# Patient Record
Sex: Female | Born: 1940
Health system: Southern US, Community
[De-identification: ages and names within clinical notes are randomized; demographics above are authoritative.]

## PROBLEM LIST (undated history)

## (undated) DIAGNOSIS — D471 Chronic myeloproliferative disease: Secondary | ICD-10-CM

## (undated) DIAGNOSIS — E669 Obesity, unspecified: Secondary | ICD-10-CM

## (undated) DIAGNOSIS — I272 Pulmonary hypertension, unspecified: Secondary | ICD-10-CM

## (undated) DIAGNOSIS — K219 Gastro-esophageal reflux disease without esophagitis: Secondary | ICD-10-CM

## (undated) DIAGNOSIS — D649 Anemia, unspecified: Secondary | ICD-10-CM

## (undated) DIAGNOSIS — Z9889 Other specified postprocedural states: Secondary | ICD-10-CM

## (undated) DIAGNOSIS — D45 Polycythemia vera: Secondary | ICD-10-CM

## (undated) DIAGNOSIS — H3530 Unspecified macular degeneration: Secondary | ICD-10-CM

## (undated) DIAGNOSIS — K449 Diaphragmatic hernia without obstruction or gangrene: Secondary | ICD-10-CM

## (undated) DIAGNOSIS — R112 Nausea with vomiting, unspecified: Secondary | ICD-10-CM

## (undated) DIAGNOSIS — I5032 Chronic diastolic (congestive) heart failure: Secondary | ICD-10-CM

## (undated) DIAGNOSIS — K559 Vascular disorder of intestine, unspecified: Secondary | ICD-10-CM

## (undated) DIAGNOSIS — J189 Pneumonia, unspecified organism: Secondary | ICD-10-CM

## (undated) DIAGNOSIS — J449 Chronic obstructive pulmonary disease, unspecified: Secondary | ICD-10-CM

## (undated) DIAGNOSIS — Z8542 Personal history of malignant neoplasm of other parts of uterus: Secondary | ICD-10-CM

## (undated) DIAGNOSIS — G459 Transient cerebral ischemic attack, unspecified: Secondary | ICD-10-CM

## (undated) DIAGNOSIS — R87629 Unspecified abnormal cytological findings in specimens from vagina: Secondary | ICD-10-CM

## (undated) DIAGNOSIS — I10 Essential (primary) hypertension: Secondary | ICD-10-CM

## (undated) DIAGNOSIS — R011 Cardiac murmur, unspecified: Secondary | ICD-10-CM

## (undated) DIAGNOSIS — M1990 Unspecified osteoarthritis, unspecified site: Secondary | ICD-10-CM

## (undated) DIAGNOSIS — I499 Cardiac arrhythmia, unspecified: Secondary | ICD-10-CM

## (undated) DIAGNOSIS — R0602 Shortness of breath: Secondary | ICD-10-CM

## (undated) DIAGNOSIS — C801 Malignant (primary) neoplasm, unspecified: Secondary | ICD-10-CM

## (undated) DIAGNOSIS — M109 Gout, unspecified: Secondary | ICD-10-CM

## (undated) HISTORY — DX: Pneumonia, unspecified organism: J18.9

## (undated) HISTORY — DX: Malignant (primary) neoplasm, unspecified: C80.1

## (undated) HISTORY — DX: Polycythemia vera: D45

## (undated) HISTORY — DX: Unspecified abnormal cytological findings in specimens from vagina: R87.629

## (undated) HISTORY — DX: Essential (primary) hypertension: I10

## (undated) HISTORY — PX: JOINT REPLACEMENT: SHX530

## (undated) HISTORY — DX: Chronic diastolic (congestive) heart failure: I50.32

## (undated) HISTORY — DX: Personal history of malignant neoplasm of other parts of uterus: Z85.42

## (undated) HISTORY — DX: Chronic myeloproliferative disease: D47.1

## (undated) HISTORY — DX: Transient cerebral ischemic attack, unspecified: G45.9

## (undated) HISTORY — PX: EYE SURGERY: SHX253

## (undated) HISTORY — DX: Cardiac murmur, unspecified: R01.1

---

## 2002-03-17 ENCOUNTER — Ambulatory Visit (HOSPITAL_COMMUNITY): Admission: RE | Admit: 2002-03-17 | Discharge: 2002-03-17 | Payer: Self-pay | Admitting: Family Medicine

## 2002-03-17 ENCOUNTER — Encounter: Payer: Self-pay | Admitting: Family Medicine

## 2003-01-22 ENCOUNTER — Ambulatory Visit (HOSPITAL_COMMUNITY): Admission: RE | Admit: 2003-01-22 | Discharge: 2003-01-22 | Payer: Self-pay | Admitting: Internal Medicine

## 2003-07-17 ENCOUNTER — Other Ambulatory Visit: Admission: RE | Admit: 2003-07-17 | Discharge: 2003-07-17 | Payer: Self-pay | Admitting: Obstetrics and Gynecology

## 2004-01-27 ENCOUNTER — Ambulatory Visit (HOSPITAL_COMMUNITY): Admission: RE | Admit: 2004-01-27 | Discharge: 2004-01-27 | Payer: Self-pay | Admitting: Internal Medicine

## 2004-05-20 ENCOUNTER — Ambulatory Visit (HOSPITAL_COMMUNITY): Admission: RE | Admit: 2004-05-20 | Discharge: 2004-05-20 | Payer: Self-pay | Admitting: Internal Medicine

## 2005-02-13 ENCOUNTER — Encounter (HOSPITAL_COMMUNITY): Admission: RE | Admit: 2005-02-13 | Discharge: 2005-02-14 | Payer: Self-pay | Admitting: Internal Medicine

## 2005-09-14 ENCOUNTER — Encounter: Admission: RE | Admit: 2005-09-14 | Discharge: 2005-09-14 | Payer: Self-pay | Admitting: Family Medicine

## 2005-09-14 ENCOUNTER — Emergency Department (HOSPITAL_COMMUNITY): Admission: EM | Admit: 2005-09-14 | Discharge: 2005-09-14 | Payer: Self-pay | Admitting: Emergency Medicine

## 2007-12-10 ENCOUNTER — Ambulatory Visit (HOSPITAL_COMMUNITY): Admission: RE | Admit: 2007-12-10 | Discharge: 2007-12-10 | Payer: Self-pay | Admitting: Internal Medicine

## 2008-01-28 ENCOUNTER — Ambulatory Visit (HOSPITAL_COMMUNITY): Admission: RE | Admit: 2008-01-28 | Discharge: 2008-01-28 | Payer: Self-pay | Admitting: Internal Medicine

## 2009-10-15 ENCOUNTER — Emergency Department (HOSPITAL_COMMUNITY): Admission: EM | Admit: 2009-10-15 | Discharge: 2009-10-15 | Payer: Self-pay | Admitting: Emergency Medicine

## 2010-01-18 ENCOUNTER — Emergency Department (HOSPITAL_COMMUNITY): Admission: EM | Admit: 2010-01-18 | Discharge: 2010-01-18 | Payer: Self-pay | Admitting: Emergency Medicine

## 2010-02-07 ENCOUNTER — Other Ambulatory Visit: Admission: RE | Admit: 2010-02-07 | Discharge: 2010-02-07 | Payer: Self-pay | Admitting: Obstetrics and Gynecology

## 2010-02-08 ENCOUNTER — Ambulatory Visit (HOSPITAL_COMMUNITY): Admission: RE | Admit: 2010-02-08 | Discharge: 2010-02-08 | Payer: Self-pay | Admitting: Obstetrics & Gynecology

## 2010-02-14 ENCOUNTER — Ambulatory Visit (HOSPITAL_COMMUNITY): Admission: RE | Admit: 2010-02-14 | Discharge: 2010-02-14 | Payer: Self-pay | Admitting: Obstetrics & Gynecology

## 2010-02-23 ENCOUNTER — Ambulatory Visit (HOSPITAL_COMMUNITY): Admission: RE | Admit: 2010-02-23 | Discharge: 2010-02-23 | Payer: Self-pay | Admitting: Obstetrics & Gynecology

## 2010-05-24 ENCOUNTER — Ambulatory Visit: Payer: Self-pay | Admitting: Orthopedic Surgery

## 2010-05-24 DIAGNOSIS — IMO0002 Reserved for concepts with insufficient information to code with codable children: Secondary | ICD-10-CM | POA: Insufficient documentation

## 2010-05-24 DIAGNOSIS — M1710 Unilateral primary osteoarthritis, unspecified knee: Secondary | ICD-10-CM

## 2010-05-25 ENCOUNTER — Encounter: Payer: Self-pay | Admitting: Orthopedic Surgery

## 2010-09-04 ENCOUNTER — Emergency Department (HOSPITAL_COMMUNITY): Admission: EM | Admit: 2010-09-04 | Discharge: 2010-09-04 | Payer: Self-pay | Admitting: Emergency Medicine

## 2010-11-27 ENCOUNTER — Encounter: Payer: Self-pay | Admitting: Internal Medicine

## 2010-12-08 NOTE — Letter (Signed)
Summary: History form  History form   Imported By: Jacklynn Ganong 05/26/2010 09:25:24  _____________________________________________________________________  External Attachment:    Type:   Image     Comment:   External Document

## 2010-12-08 NOTE — Assessment & Plan Note (Signed)
Summary: BI KNEE PAIN NEEDS XR/SEC HOR/BSF   Vital Signs:  Patient profile:   70 year old female Height:      65 inches Weight:      279 pounds Pulse rate:   80 / minute Resp:     18 per minute  Vitals Entered By: Fuller Canada MD (May 24, 2010 3:05 PM)  Visit Type:  new patient Referring Provider:  self Primary Provider:  Dr. Ouida Sills  CC:  bilateral knee pain.  History of Present Illness: I saw [name] in the office today for an initial visit.  She is a 70 years old woman with the complaint of:  bilateral knee pain.  Xrays today in our office.  Meds: Atenolol, HCTZ, Nexium.    Allergies (verified): 1)  ! Tegretol  Past History:  Past Medical History: htn reflux  Past Surgical History: spur removed rt shoulder  Family History: Family History of Arthritis  Social History: Patient is married.  retired no smokintg no alcohol 6 cups of caffeine per day  Review of Systems Constitutional:  Complains of fatigue; denies weight loss, weight gain, fever, and chills. Cardiovascular:  Denies chest pain, palpitations, fainting, and murmurs. Respiratory:  Complains of short of breath, wheezing, and couch; denies tightness, pain on inspiration, and snoring . Gastrointestinal:  Complains of heartburn and vomiting; denies nausea, diarrhea, constipation, and blood in your stools. Genitourinary:  Denies frequency, urgency, difficulty urinating, painful urination, flank pain, and bleeding in urine. Neurologic:  Complains of dizziness; denies numbness, tingling, unsteady gait, tremors, and seizure. Musculoskeletal:  Complains of joint pain and swelling; denies instability, stiffness, redness, heat, and muscle pain. Endocrine:  Denies excessive thirst, exessive urination, and heat or cold intolerance. Psychiatric:  Denies nervousness, depression, anxiety, and hallucinations. Skin:  Denies changes in the skin, poor healing, rash, itching, and redness. HEENT:  Complains  of watering; denies blurred or double vision, eye pain, and redness. Immunology:  Denies seasonal allergies, sinus problems, and allergic to bee stings. Hemoatologic:  Denies easy bleeding and brusing.  Physical Exam  Msk:  The patient is well developed and nourished, with normal grooming and hygiene. The body habitus is  notable for obesity Pulses:  The pulses and perfusion were normal with normal color, temperature  and no swelling  The venous stasis on the LEFT is more severe than the RIGHT with multiple engorged veins  Extremities:  bilateral knee ambulation is noted for no excessive length or asymmetry.  Knees exhibit flexion arc 115 with crepitance in the patellofemoral joint, medial joint line tenderness, normal motor exam, no ligamentous instability and no joint effusion Neurologic:  The coordination and sensation were normal  The reflexes were normal   Skin:  intact without lesions or rashes Inguinal Nodes:  no significant adenopathy Psych:  alert and cooperative; normal mood and affect; normal attention span and concentration   Impression & Recommendations:  Problem # 1:  KNEE, ARTHRITIS, DEGEN./OSTEO (ICD-715.96)  bilateral knee x-rays show severe patellofemoral joint disease with joint space narrowing and almost no joint space visible.  The tibiofemoral articulation is fairly preserved.  Impression excessive patellofemoral joint arthritis with overall gonarthrosis.  Recommend injection or anti-inflammatory. I think the anti-inflammatory should be okay. She is already on Nexium, and she does not want an injection and is not ready for surgery. Her updated medication list for this problem includes:    Cataflam 50 Mg Tabs (Diclofenac potassium) .Marland Kitchen... 1 two times a day  Orders: New Patient Level III ([address])  Knee x-ray,  3 views ([address]40)  Medications Added to Medication List This Visit: 1)  Cataflam 50 Mg Tabs (Diclofenac potassium) .Marland Kitchen.. 1 two times a day  Patient  Instructions: 1)  Arthritis of the knees  2)  Options  3)  injection vs oral antiinflammatory  4)  Diclofenac 50 mg two times a day  5)  Please schedule a follow-up appointment as needed. Prescriptions: CATAFLAM 50 MG TABS (DICLOFENAC POTASSIUM) 1 two times a day  #60 x 5   Entered and Authorized by:   Fuller Canada MD   Signed by:   Fuller Canada MD on 05/24/2010   Method used:   Print then Give to Patient   RxID:   [id number]   Appended Document: BI KNEE PAIN NEEDS XR/SEC HOR/BSF h/o knee pain after trip to [facility] in which she had to do a lot of stair climbing, while visiting her daughter. She now complains of a gradual onset of sharp throbbing, diffuse knee pain, which she rates a 5 or 8/10. It occurs intermittently and is associated with swelling.  Previous treatment, Tylenol, arthritis, which did not help. Surgery years ago. She had Celebrex but developed reflux, and had gone, Nexium. She is currently taking Advil 400 mg with some relief.  She has a family history of rheumatoid arthritis.

## 2011-01-18 LAB — CBC
HCT: 46.4 % — ABNORMAL HIGH (ref 36.0–46.0)
Hemoglobin: 15.5 g/dL — ABNORMAL HIGH (ref 12.0–15.0)
MCH: 27.1 pg (ref 26.0–34.0)
MCHC: 33.3 g/dL (ref 30.0–36.0)
Platelets: 616 10*3/uL — ABNORMAL HIGH (ref 150–400)

## 2011-01-18 LAB — DIFFERENTIAL
Basophils Relative: 1 % (ref 0–1)
Eosinophils Absolute: 0.4 10*3/uL (ref 0.0–0.7)
Eosinophils Relative: 3 % (ref 0–5)
Lymphs Abs: 1.7 10*3/uL (ref 0.7–4.0)

## 2011-01-18 LAB — BASIC METABOLIC PANEL
CO2: 30 mEq/L (ref 19–32)
Chloride: 96 mEq/L (ref 96–112)
GFR calc Af Amer: >60 mL/min (ref >60)
Potassium: 3.6 mEq/L (ref 3.5–5.1)
Sodium: 136 mEq/L (ref 135–145)

## 2011-02-07 LAB — CBC
Hemoglobin: 15.9 g/dL — ABNORMAL HIGH (ref 12.0–15.0)
MCHC: 33.8 g/dL (ref 30.0–36.0)
MCV: 80.3 fL (ref 78.0–100.0)
Platelets: 528 10*3/uL — ABNORMAL HIGH (ref 150–400)
RBC: 5.85 MIL/uL — ABNORMAL HIGH (ref 3.87–5.11)
WBC: 7.6 10*3/uL (ref 4.0–10.5)

## 2011-02-07 LAB — URINALYSIS, ROUTINE W REFLEX MICROSCOPIC
Glucose, UA: NEGATIVE mg/dL
Hgb urine dipstick: NEGATIVE
Nitrite: NEGATIVE
Specific Gravity, Urine: 1.02 (ref 1.005–1.030)
Urobilinogen, UA: 1 mg/dL (ref 0.0–1.0)

## 2011-02-07 LAB — DIFFERENTIAL
Basophils Relative: 0 % (ref 0–1)
Lymphs Abs: 1 10*3/uL (ref 0.7–4.0)
Monocytes Absolute: 0.5 10*3/uL (ref 0.1–1.0)
Monocytes Relative: 6 % (ref 3–12)
Neutro Abs: 6.1 10*3/uL (ref 1.7–7.7)
Neutrophils Relative %: 81 % — ABNORMAL HIGH (ref 43–77)

## 2011-02-07 LAB — COMPREHENSIVE METABOLIC PANEL
AST: 30 U/L (ref 0–37)
CO2: 31 mEq/L (ref 19–32)
Calcium: 8.8 mg/dL (ref 8.4–10.5)
Creatinine, Ser: 0.49 mg/dL (ref 0.4–1.2)
GFR calc non Af Amer: >60 mL/min (ref >60)
Glucose, Bld: 117 mg/dL — ABNORMAL HIGH (ref 70–99)
Potassium: 3.6 mEq/L (ref 3.5–5.1)
Total Bilirubin: 0.6 mg/dL (ref 0.3–1.2)

## 2011-02-07 LAB — URINE MICROSCOPIC-ADD ON

## 2011-03-09 ENCOUNTER — Encounter (HOSPITAL_COMMUNITY): Payer: Self-pay

## 2011-03-13 ENCOUNTER — Ambulatory Visit (HOSPITAL_COMMUNITY)
Admission: RE | Admit: 2011-03-13 | Discharge: 2011-03-13 | Disposition: A | Discharge status: Home / Self Care | Payer: Medicare Other | Source: Ambulatory Visit | Attending: Pulmonary Disease | Admitting: Pulmonary Disease

## 2011-03-13 DIAGNOSIS — R0989 Other specified symptoms and signs involving the circulatory and respiratory systems: Secondary | ICD-10-CM | POA: Insufficient documentation

## 2011-03-13 DIAGNOSIS — R0609 Other forms of dyspnea: Secondary | ICD-10-CM | POA: Insufficient documentation

## 2011-03-13 LAB — BLOOD GAS, ARTERIAL
Acid-Base Excess: 1.7 mmol/L (ref 0.0–2.0)
O2 Saturation: 95.1 %
Patient temperature: 37
TCO2: 22.1 mmol/L (ref 0–100)
pCO2 arterial: 42 mmHg (ref 35.0–45.0)
pH, Arterial: 7.407 — ABNORMAL HIGH (ref 7.350–7.400)
pO2, Arterial: 74.3 mmHg — ABNORMAL LOW (ref 80.0–100.0)

## 2011-03-24 NOTE — Procedures (Signed)
NAME:  [name], [name]                        ACCOUNT NO.:  [id number]   MEDICAL RECORD NO.:  [id number]                   PATIENT TYPE:  OUT   LOCATION:  RAD                                  FACILITY:  [facility]   PHYSICIAN:  Edward L. [name], M.D.             DATE OF BIRTH:  June 29, 1941   DATE OF PROCEDURE:  DATE OF DISCHARGE:                              PULMONARY FUNCTION TEST   FINDINGS:  1. Spirometry reveals a mild, ventilatory defect without definite air-flow     obstruction.  2. Lung volumes show reduction in total lung capacity of approximately the     same order of magnitude as the ventilatory defect.  3. DLCO is mildly reduced.      ___________________________________________                                            Oneal Deputy. [name], M.D.   ELH/MEDQ  D:  01/28/2004  T:  01/28/2004  Job:  [id number]   cc:   Kingsley Callander. Ouida Sills, M.D.  [address]  [place]  [place] [address]  Fax: [phone]

## 2011-03-24 NOTE — Consult Note (Signed)
[name], [name]                          ACCOUNT NO.:  [id number]   MEDICAL RECORD NO.:  [id number]                  PATIENT TYPE:   LOCATION:                                       FACILITY:   PHYSICIAN:  Lionel December, M.D.                 DATE OF BIRTH:  1941-06-20   DATE OF CONSULTATION:  05/18/2004  DATE OF DISCHARGE:                                   CONSULTATION   REASON FOR CONSULTATION:  Flare-up with symptoms of GERD, not responding to  therapy.   HISTORY OF PRESENT ILLNESS:  [name] is a 70 year old Caucasian female who  is referred through the courtesy of Dr. Ouida Sills for GI evaluation.  She has a  several year history of GERD.  She had a moderate sized hiatal hernia.  She  had laparoscopic Nissen by Dr. Lovell Sheehan in September 1996.  She recalled that  she did well for a couple of years.  Then she started to have heartburn,  regurgitation and went back on Prilosec six years ago.  She started to have  a recurrent cough and also intermittent hoarseness.  Two months ago she was  switched to Nexium.  Since she has been on Nexium, she has noted reduction  in her cough.  She had a chest x-ray by Dr. Ouida Sills.  This suggested a  moderate sized sliding hiatal hernia.  The chest x-ray was performed because  of chest pain and shortness of breath.  The patient is concerned about  recurrence of her hernia and is interested in having this fixed again if it  would improve her symptoms.  She also complains of dyspnea, which occurs  when she is lying flat.  She has a cough intermittently, which is dry.  She  is using [employer], which helps.  Although she has lost six pounds  recently, her weight has been gradually going up over the last few years.  When she saw Dr. Jena Gauss back in February 1996, her weight was 246 pounds; now  she is 279 pounds.  She denies dysphagia.  She has a very good appetite.  She denies abdominal pain, melena or rectal bleeding.  Her bowels generally  move  regularly.  She was seen by Dr. Ouida Sills recently.  He noted that she was  borderline diabetic and suggested that she go on a diet and try to lose two  pounds a month.  She does not do much in the way of exercise or walking.   She is on:  1. Nexium 40 mg b.i.d.  2. Allegra 180 mg q.d.  3. HCTZ 25 mg q.d.  4. Benzonatate Perles 200 mg t.i.d. p.r.n.   PAST MEDICAL HISTORY:  1. She has hypertension.  2. Chronic cough, felt to be secondary to reflux.  3. She has had benign lesion removed from her breast.  4. She has had ear surgery years ago.  5. She also has  a history of seizure disorder.  She was seen by Dr. Buzzy Han     back in 1995 and his assessment was that she had uncinate seizure or     partial seizure complex.  Lately she has not been on any medication.  6. She also had colonoscopy by Dr. Jena Gauss in September 1995, which was     essentially normal.  Her EGD in July 1995 revealed Schatzki's ring, 6 cm     sized hiatal hernia and small papule in her esophagus, which, on biopsy,     revealed esophagitis.   ALLERGIES:  TEGRETOL.   FAMILY HISTORY:  Mother is 6 years old and in fair health.  Father died of  lung carcinoma at age 64.  She has two sisters in good health.   SOCIAL HISTORY:  She is married and has five children in good health.  She  works with [employer] out of [place].  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  She has never smoked cigarettes and does  not drink alcohol.   PHYSICAL EXAMINATION:  GENERAL:  Pleasant, very obese, Caucasian female who  is in no acute distress.  VITAL SIGNS:  She weighs 279 pounds; she is 68 inches tall; pulse 80;  __________ blood pressure 120/82; she is afebrile.  HEENT:  Conjunctivae is pink; sclerae is nonicteric; oropharyngeal mucosa is  normal.  NECK:  Without masses or thyromegaly.  CARDIAC:  Regular rhythm; normal S1, S3; no murmur or gallop noted.  LUNGS:  Clear to auscultation.  ABDOMEN:  Obese, soft  and nontender without organomegaly or masses.  RECTAL:  Deferred.  EXTREMITIES:  She has trace edema at her ankles.   She had a chest x-ray on January 27, 2004 which revealed moderate sized hiatal  hernia with no active lung disease.   She had pulmonary function studies, which revealed some normal lung function  values.  However, she did not have these studies in the supine position.   ASSESSMENT:  [name] is a 70 year old Caucasian female who has chronic  cough, which appears to be secondary to poorly controlled gastroesophageal  reflux disease.  She is status post laparoscopic Nissen about nine years  ago.  I suspect that her wrap has been undone, as is obvious by recurrence  of at least a moderate sized hiatal hernia.  I doubt that this is a slipped  Nissen.  As far as her positional dyspnea or supine dyspnea is concerned, I  am not convinced that this is due to reflux.  I suspect that this is due to  restrictive disease secondary to her obesity.  It would have been  interesting to see what her lung volumes were in supine position.  I  completely agree with Dr. Ouida Sills that weight reduction is crucial in her  treatment plan.   She is at the age of risk for colorectal carcinoma and last colonoscopy was  about 10 years ago and she can consider having one at a later date or next  year when her other issues have been addressed.   RECOMMENDATIONS:  She will continue anti-reflux measures as before.  Weight  reduction is stressed with the patient.  She needs to start walking or  exercising and gradually increase the duration of activity.   Diagnostic esophagogastroduodenoscopy to be performed at [facility]  in the near future.   We would like to thank Dr. Ouida Sills for the opportunity to participate in the  care of this nice lady.      ___________________________________________                                            Lionel December, M.D.  NR/MEDQ  D:  05/18/2004  T:   05/18/2004  Job:  [id number]   cc:   Kingsley Callander. Ouida Sills, M.D.  [address]  [place]  [place] [address]  Fax: [phone]

## 2011-03-24 NOTE — Op Note (Signed)
NAME:  [name], [name]                        ACCOUNT NO.:  [id number]   MEDICAL RECORD NO.:  [id number]                   PATIENT TYPE:  AMB   LOCATION:  DAY                                  FACILITY:  [facility]   PHYSICIAN:  Lionel December, M.D.                 DATE OF BIRTH:  09/15/1941   DATE OF PROCEDURE:  DATE OF DISCHARGE:                                 OPERATIVE REPORT   PROCEDURE:  Esophagogastroduodenoscopy.   ENDOSCOPIST:  Lionel December, M.D.   INDICATIONS:  This patient is a 70 year old Caucasian female with  longstanding GERD who had laparoscopic Nissen in 1996, but had to go back  onto a PPI about 6 years ago and now she is on double-dose Nexium and still  coughing, particularly in the supine position and she also has dyspnea when  she is supine. It is suspected that she is still refluxing, although dyspnea  in the supine position may be due to positional restrictive disease.  She is  undergoing diagnostic EGD.  The procedure and risks were reviewed with the  patient and informed consent was obtained.   PREOPERATIVE MEDICATIONS:  Cetacaine spray for oropharyngeal topical  anesthesia, Demerol 50 mg IV and Versed 6 mg IV.   FINDINGS:  Procedure performed in endoscopy suite.  The patient's vital  signs and O2 saturation were monitored during the procedure and remained  stable.  The patient was placed in the left lateral recumbent position and  Olympus videoscope was passed via the oropharynx without any difficulty into  the esophagus.   ESOPHAGUS:  Mucosa of the esophagus was normal except that she had 2 patches  of gastric-type or salmon-colored mucosa above the GE junction.  The GE  junction was at 35 cm.  The length of this mucosa was 1 cm.  The larger of  the 2 patches which was located around 2-3 o'clock had a tiny Michaelfurt of  squamous epithelium in it.  Pictures taken, followed by biopsies on the way  out to confirm endoscopic impression.   Diaphragmatic hiatus was  at 40 cm.  The hernia was felt to be at least a  moderate size; and she had some food debris in this hernia.   STOMACH:  It was empty and distended very well with insufflation.  The folds  of the proximal stomach were normal.  Examination of the mucosa at body,  antrum, pyloric channel, as well as fundus and cardia was normal.  There was  no wrap; it was completely undone.   DUODENUM:  Examination of the bulb revealed normal mucosa.  The scope was  passed to the second part of the duodenum where the mucosa and folds were  normal.   Endoscope was withdrawn.  On the way out biopsy was taken from this gastric-  type mucosa distal esophagus. The patient tolerated the procedure well.   FINAL DIAGNOSES:  1. Moderate sized sliding hiatal  hernia with some food debris in the     dependent segment of his hernia.  2. Fundal wrap is nonexistent or completely gone.  3. Two patches of gastric-type mucosa at distal esophagus, very suspicious     for Barrett's; segment biopsy taken.   RECOMMENDATIONS:  1. She should continue antireflux measures. Since she is interested in     antireflux surgery, I would recommend that she looses about 30 pounds     before this is considered.  In the meantime will try her on Reglan at 10     mg b.i.d. (before breakfast and the evening meal).  2. I will be contacting the patient with biopsy results.      ___________________________________________                                            Lionel December, M.D.   NR/MEDQ  D:  05/20/2004  T:  05/20/2004  Job:  [id number]   cc:   Kingsley Callander. Ouida Sills, M.D.  [address]  [place]  [place] [address]  Fax: [phone]   Dalia Heading, M.D.  [address] [place]  [place] [address]  Fax: [phone]

## 2011-03-24 NOTE — Procedures (Signed)
NAME:  [name], [name]              ACCOUNT NO.:  [id number]   MEDICAL RECORD NO.:  [id number]         PATIENT TYPE:  REC   LOCATION:  RAD                           FACILITY:  [facility]   PHYSICIAN:  Kingsley Callander. Ouida Sills, MD       DATE OF BIRTH:  [birth year]   DATE OF PROCEDURE:  02/13/2005  DATE OF DISCHARGE:                                    STRESS TEST   PROCEDURE:  Adenosine Myoview stress test.   OPERATOR:  Kingsley Callander. Ouida Sills, M.D.   The patient underwent an Adenosine Myoview stress test accoding to the  protocol. Adenosine was administered over 4 minutes.  Myoview was injected  at 3 minutes.  The patient experienced chest tightness and flushing.  Her  baseline EKG revealed normal sinus rhythm.  There were no ischemic changes  throughout the test. She was normotensive.  Her symptoms resolved promptly  after the adenosine infusion was completed.   Myoview images are pending.      ROF/MEDQ  D:  02/13/2005  T:  02/13/2005  Job:  [id number]

## 2011-03-24 NOTE — Op Note (Signed)
   NAME:  MAKAI, AGOSTINELLI                          ACCOUNT NO.:  [id number]   MEDICAL RECORD NO.:  [id number]                   PATIENT TYPE:  AMB   LOCATION:  DAY                                  FACILITY:  [facility]   PHYSICIAN:  Tilda Burrow, M.D.              DATE OF BIRTH:  10-13-1941   DATE OF PROCEDURE:  DATE OF DISCHARGE:                                 OPERATIVE REPORT   PREOPERATIVE DIAGNOSIS:  Postmenopausal bleeding.   POSTOPERATIVE DIAGNOSES:  1. Postmenopausal bleeding.  2. Endometrial polyp.   PROCEDURE:  1. Hysteroscopy.  2. Dilatation and curettage with excision of endometrial polyp.   SURGEON:  Tilda Burrow, M.D.   ASSISTANT:  None.   ANESTHESIA:  Spinal   COMPLICATIONS:  None.   FINDINGS:  Uterus sounding to 8 cm, stenotic endocervical canal initially  making sounding, and dilation difficulty but responding subsequently.  A 3-  cm, posterior, right cornual or right fundal polyp.  Smooth endometrium  otherwise.  Photo documentation taken.   DETAILS OF PROCEDURE:  The patient was taken to the operating room and  prepped and draped for vaginal procedure after spinal anesthesia in place.  The legs were supported with yellow fin leg supports.  In and out  catheterization of the bladder removed 8 ounces of urine.  We grasped the  cervix with a single-tooth tenaculum, dilated to 31 [place] and sounded to a  depth of 8 cm.   The hysteroscope was introduce and direct hysteroscopic visualization showed  a large polyp originating from the fundal portion on the right side towards  the back.  Hysteroscopic scissors were used to cut, partially through, the  pedicle, but technically limitations prevented complete excision this way.  Its large size allowed it to be grasped with uterine packing forceps and it  was extracted using blunt perception and then smooth, sharp, curettage in  that area removed some additional fragments.  Reinspection, as documented in  photo  #3, showed that the pedicle had been removed excellently with minimal  residual.  We trimmed a couple of remnants of the pedicle with hysteroscopic  scissors and then considered the procedure a success.  Tissue was sent for  confirmation, histologic evaluation.  The patient allowed to go to the  recovery room in good condition.                                               Tilda Burrow, M.D.    JVF/MEDQ  D:  01/22/2003  T:  01/22/2003  Job:  [id number]   cc:   Kingsley Callander. Ouida Sills, M.D.  [address]  [place]  [place] [address]  Fax: [phone]

## 2011-03-24 NOTE — H&P (Signed)
NAME:  [name], [name]                        ACCOUNT NO.:  [id number]   MEDICAL RECORD NO.:  [id number]                  PATIENT TYPE:   LOCATION:                                       FACILITY:   PHYSICIAN:  Tilda Burrow, M.D.              DATE OF BIRTH:  04/16/1941   DATE OF ADMISSION:  01/22/2003  DATE OF DISCHARGE:                                HISTORY & PHYSICAL   ADMISSION DIAGNOSES:  1. Postmenopausal bleeding.  2. Obesity.  3. Uncinate seizures, stable.  4. Gastroesophageal reflux disease.   HISTORY OF PRESENT ILLNESS:  This 70 year old female many years  postmenopausal, with a history of intermittent breakthrough bleeding on  Premphase during the late 1990s for which endometrial biopsy was performed  showing simple hyperplasia without atypia, has been managed on Prempro until  2003, at which time she discontinued all hormone therapy due to concerns  over controversy regarding breast cancer risks.  Over the past year she has  been on no hormone therapy at all.  She recently began to have  postmenopausal bleeding of a heavy nature equal to a normal menses.  The  symptoms began as cramping for approximately two weeks and then the lower  abdominal discomfort turned into bleeding.  The patient cannot tolerate  endometrial biopsy in the office and is therefore scheduled for  hysteroscopy, D&C for tissue diagnosis.  The patient has had the procedure  explained using instructional booklets from [facility] and [facility] regarding  hysteroscopy.   PAST MEDICAL HISTORY:  Positive for uncinate seizures since 1995, adult-  onset.  These consist of a weird odor, impending doom sensation, with a  normal 16-channel EEG performed in 1995.  The patient is not on any other  medicines at this time.  She was tried on Tegretol and found to be  allergic to this.  Medical history also notable for heartburn, for which  she takes Prilosec OTC.   PAST SURGICAL HISTORY:  In 03/07/88, bilateral  breast biopsies, Barbaraann Barthel, M.D.  07/08/88, rectocele and enterocele repair with preservation of  the uterus, Tilda Burrow, M.D.  1990, ear surgery.  Hernia repair, Dalia Heading, M.D., I do not have the date.   ALLERGIES:  TEGRETOL.   MEDICATIONS:  1. Prilosec OTC.  2. Levsin 0.125 mg t.i.d. sublingual for GI spasm.  3. HCTZ 12.5 mg daily by Kingsley Callander. Ouida Sills, M.D. (question hypertension).   PHYSICAL EXAMINATION:  GENERAL:  An obese, large-framed Caucasian female.  VITAL SIGNS:  Height 5 feet 9 inches, weight 276, blood pressure 140/80.  HEENT:  Pupils are equal, round, reactive.  Extraocular movements intact.  NECK:  Supple.  CARDIAC:  Unremarkable.  ABDOMEN:  Marked obesity.  PELVIC:  External genitalia shows good support, status post posterior  repair.  Cervix is well-supported, and the canal is stenotic.  Bleeding is  coming from the endocervical canal but, unfortunately, the patient  could not  tolerate office endometrial biopsy efforts.  Adnexa negative for masses.  EXTREMITIES:  Without gross abnormality.  Good perfusion.  No cyanosis,  clubbing, or edema.   ASSESSMENT:  Postmenopausal bleeding in a 70 year old female, for  hysteroscopy, D&C on 01/21/02, at [facility].                                               Tilda Burrow, M.D.    JVF/MEDQ  D:  01/13/2003  T:  01/13/2003  Job:  [id number]   cc:   Kingsley Callander. Ouida Sills, M.D.  [address]  [place]  [place] [address]  Fax: [phone]

## 2011-05-27 ENCOUNTER — Encounter: Payer: Self-pay | Admitting: *Deleted

## 2011-05-27 ENCOUNTER — Emergency Department (HOSPITAL_COMMUNITY): Payer: Medicare Other

## 2011-05-27 ENCOUNTER — Emergency Department (HOSPITAL_COMMUNITY)
Admission: EM | Admit: 2011-05-27 | Discharge: 2011-05-27 | Disposition: A | Discharge status: Home / Self Care | Payer: Medicare Other | Attending: Emergency Medicine | Admitting: Emergency Medicine

## 2011-05-27 DIAGNOSIS — M25552 Pain in left hip: Secondary | ICD-10-CM

## 2011-05-27 DIAGNOSIS — M25569 Pain in unspecified knee: Secondary | ICD-10-CM | POA: Insufficient documentation

## 2011-05-27 DIAGNOSIS — M79672 Pain in left foot: Secondary | ICD-10-CM

## 2011-05-27 DIAGNOSIS — W19XXXA Unspecified fall, initial encounter: Secondary | ICD-10-CM | POA: Insufficient documentation

## 2011-05-27 DIAGNOSIS — M79609 Pain in unspecified limb: Secondary | ICD-10-CM | POA: Insufficient documentation

## 2011-05-27 DIAGNOSIS — M25562 Pain in left knee: Secondary | ICD-10-CM

## 2011-05-27 DIAGNOSIS — M25559 Pain in unspecified hip: Secondary | ICD-10-CM | POA: Insufficient documentation

## 2011-05-27 HISTORY — DX: Unspecified osteoarthritis, unspecified site: M19.90

## 2011-05-27 HISTORY — DX: Essential (primary) hypertension: I10

## 2011-05-27 MED ORDER — HYDROCODONE-ACETAMINOPHEN 5-325 MG PO TABS: 1.0000 | ORAL_TABLET | ORAL | Status: AC | PRN

## 2011-05-27 NOTE — ED Notes (Signed)
Pt states she fell at ~ 1100. Pain to left hip, knee and foot. Pt states she thinks she tripped and fell

## 2011-05-27 NOTE — ED Notes (Signed)
Pt states she was sweeping her floor today and fell on her left while walking down a ramp from kitchen to living room

## 2011-05-27 NOTE — ED Provider Notes (Addendum)
History     Chief Complaint  Patient presents with  . Fall   Patient is a 70 y.o. female presenting with fall. The history is provided by the patient.  Fall The accident occurred 1 to 2 hours ago. The fall occurred while walking. She landed on a hard floor. There was no blood loss. The pain is present in the left hip and left knee (left foot). The pain is moderate. She was ambulatory at the scene. There was no entrapment after the fall. There was no drug use involved in the accident. There was no alcohol use involved in the accident. Pertinent negatives include no loss of consciousness. The symptoms are aggravated by standing, use of the injured limb and ambulation. She has tried nothing for the symptoms.    Past Medical History  Diagnosis Date  . Arthritis   . Asthma   . Hypertension     Past Surgical History  Procedure Date  . Shoulder surgery     No family history on file.  History  Substance Use Topics  . Smoking status: Never Smoker   . Smokeless tobacco: Not on file  . Alcohol Use: No    OB History    Grav Para Term Preterm Abortions TAB SAB Ect Mult Living                  Review of Systems  Neurological: Negative for loss of consciousness.  All other systems reviewed and are negative.    Physical Exam  BP 142/81  Pulse 64  Temp(Src) 97.4 F (36.3 C) (Oral)  Resp 18  Ht 5\' 4"  (1.626 m)  Wt 279 lb (126.554 kg)  BMI 47.89 kg/m2  SpO2 95%  Physical Exam  Nursing note and vitals reviewed. Constitutional: She is oriented to person, place, and time. She appears well-developed and well-nourished.  HENT:  Head: Normocephalic and atraumatic.  Eyes: Conjunctivae and EOM are normal. Pupils are equal, round, and reactive to light.  Neck: Normal range of motion. Neck supple.  Cardiovascular: Normal rate and regular rhythm.   Pulmonary/Chest: Effort normal and breath sounds normal.  Abdominal: Soft. Bowel sounds are normal.  Musculoskeletal: Normal range of  motion.       Tender left lat hip, left anterior knee, left foot  Neurological: She is alert and oriented to person, place, and time.  Skin: Skin is warm and dry.  Psychiatric: She has a normal mood and affect.    ED Course  Procedures  MDMxrays neg  Results for orders placed during the hospital encounter of 03/13/11  BLOOD GAS, ARTERIAL      Component Value Range   O2 Content, Ven ROOM AIR     pH, Arterial 7.407 (*) 7.350 - 7.400    pCO2 42.0  35.0 - 45.0 (mmHg)   pO2, Arterial 74.3 (*) 80.0 - 100.0 (mmHg)   Bicarbonate 26.0 (*) 20.0 - 24.0 (mEq/L)   TCO2 22.1  0 - 100 (mmol/L)   Acid-Base Excess 1.7  0.0 - 2.0 (mmol/L)   O2 Saturation 95.1     Patient temperature 37.0     Collection site RIGHT RADIAL     Drawn by COLLECTED BY RT     Sample type ARTERIAL     Allens test (pass/fail) PASS  PASS    Dg Hip Complete Left  05/27/2011  *RADIOLOGY REPORT*  Clinical Data: Larey Seat and injured left hip.  LEFT HIP - COMPLETE 2+ VIEW 05/27/2011:  Comparison: None.  Findings: No evidence  of acute fracture or dislocation.  Moderate axial joint space narrowing.  Well-preserved bone mineral density.  Included AP pelvis demonstrates symmetric moderate joint space narrowing in the contralateral right hip.  Sacroiliac joints and symphysis pubis intact.  No fractures involving the pelvis.  Mild degenerative changes involving the visualized lower lumbar spine.  IMPRESSION: No acute osseous abnormalities.  Osteoarthritis.  Original Report Authenticated By: Arnell Sieving, M.D.   Dg Knee Complete 4 Views Left  05/27/2011  *RADIOLOGY REPORT*  Clinical Data: Larey Seat and injured left knee.  LEFT KNEE - COMPLETE 4+ VIEW 05/27/2011:  Comparison: None.  Findings: No evidence of acute fracture or dislocation. Tricompartment joint space narrowing and associated hypertrophic spurring.  Enthesopathy at the insertion the patellar tendon on the superior patella.  No visible joint effusion.  IMPRESSION: No acute osseous  abnormality.  Osteoarthritis, possibly secondary CPPD.  Original Report Authenticated By: Arnell Sieving, M.D.   Dg Foot Complete Left  05/27/2011  *RADIOLOGY REPORT*  Clinical Data: Larey Seat and injured left foot.  LEFT FOOT - COMPLETE 3+ VIEW 05/27/2011:  Comparison: None.  Findings: No evidence of acute fracture or dislocation.  Joint space narrowing involving multiple IP joints and several joints in the hindfoot and midfoot.  Small plantar calcaneal spur.  Dorsal soft tissue swelling.  IMPRESSION: No acute osseous abnormalities.  Osteoarthritis.  Dorsal soft tissue swelling.  Original Report Authenticated By: Arnell Sieving, M.D.       Donnetta Hutching, MD 05/27/11 1610  Donnetta Hutching, MD 06/16/11 [phone]

## 2011-08-07 HISTORY — DX: Morbid (severe) obesity due to excess calories: E66.01

## 2011-08-08 ENCOUNTER — Encounter (HOSPITAL_COMMUNITY)
Admission: RE | Admit: 2011-08-08 | Discharge: 2011-08-08 | Disposition: A | Discharge status: Home / Self Care | Payer: Medicare Other | Source: Ambulatory Visit | Attending: Obstetrics and Gynecology | Admitting: Obstetrics and Gynecology

## 2011-08-08 ENCOUNTER — Other Ambulatory Visit: Payer: Self-pay | Admitting: Obstetrics and Gynecology

## 2011-08-08 ENCOUNTER — Other Ambulatory Visit: Payer: Self-pay

## 2011-08-08 ENCOUNTER — Encounter (HOSPITAL_COMMUNITY): Payer: Self-pay

## 2011-08-08 HISTORY — DX: Other specified postprocedural states: Z98.890

## 2011-08-08 HISTORY — DX: Gastro-esophageal reflux disease without esophagitis: K21.9

## 2011-08-08 HISTORY — DX: Diaphragmatic hernia without obstruction or gangrene: K44.9

## 2011-08-08 HISTORY — DX: Nausea with vomiting, unspecified: R11.2

## 2011-08-08 HISTORY — DX: Shortness of breath: R06.02

## 2011-08-08 LAB — URINALYSIS, ROUTINE W REFLEX MICROSCOPIC
Bilirubin Urine: NEGATIVE
Nitrite: NEGATIVE
Protein, ur: NEGATIVE mg/dL
Specific Gravity, Urine: 1.02 (ref 1.005–1.030)
Urobilinogen, UA: 0.2 mg/dL (ref 0.0–1.0)

## 2011-08-08 LAB — BASIC METABOLIC PANEL
BUN: 13 mg/dL (ref 6–23)
CO2: 33 mEq/L — ABNORMAL HIGH (ref 19–32)
Calcium: 9.2 mg/dL (ref 8.4–10.5)
Creatinine, Ser: 0.88 mg/dL (ref 0.50–1.10)
GFR calc Af Amer: 75 mL/min — ABNORMAL LOW (ref >90)

## 2011-08-08 LAB — CBC
MCHC: 33.1 g/dL (ref 30.0–36.0)
MCV: 82.2 fL (ref 78.0–100.0)
Platelets: 552 10*3/uL — ABNORMAL HIGH (ref 150–400)
RDW: 15.9 % — ABNORMAL HIGH (ref 11.5–15.5)
WBC: 10.4 10*3/uL (ref 4.0–10.5)

## 2011-08-08 LAB — SURGICAL PCR SCREEN: Staphylococcus aureus: NEGATIVE

## 2011-08-08 LAB — URINE MICROSCOPIC-ADD ON

## 2011-08-08 NOTE — Patient Instructions (Addendum)
20 [name]  08/08/2011   Your procedure is scheduled on:  08/15/2011  Report to [facility] at  800  AM.  Call this number if you have problems the morning of surgery: [phone]   Remember:   Do not eat food:After Midnight.  Do not drink clear liquids: After Midnight.  Take these medicines the morning of surgery with A SIP OF WATER: tenormin,catapress,nexium   Do not wear jewelry, make-up or nail polish.  Do not wear lotions, powders, or perfumes. You may wear deodorant.  Do not shave 48 hours prior to surgery.  Do not bring valuables to the hospital.  Contacts, dentures or bridgework may not be worn into surgery.  Leave suitcase in the car. After surgery it may be brought to your room.  For patients admitted to the hospital, checkout time is 11:00 AM the day of discharge.   Patients discharged the day of surgery will not be allowed to drive home.  Name and phone number of your driver: none  Special Instructions: CHG Shower Use Special Wash: 1/2 bottle night before surgery and 1/2 bottle morning of surgery.   Please read over the following fact sheets that you were given: Pain Booklet, MRSA Information, Surgical Site Infection Prevention, Anesthesia Post-op Instructions and Care and Recovery After Surgery PATIENT INSTRUCTIONS POST-ANESTHESIA  IMMEDIATELY FOLLOWING SURGERY:  Do not drive or operate machinery for the first twenty four hours after surgery.  Do not make any important decisions for twenty four hours after surgery or while taking narcotic pain medications or sedatives.  If you develop intractable nausea and vomiting or a severe headache please notify your doctor immediately.  FOLLOW-UP:  Please make an appointment with your surgeon as instructed. You do not need to follow up with anesthesia unless specifically instructed to do so.  WOUND CARE INSTRUCTIONS (if applicable):  Keep a dry clean dressing on the anesthesia/puncture wound site if there is drainage.  Once the  wound has quit draining you may leave it open to air.  Generally you should leave the bandage intact for twenty four hours unless there is drainage.  If the epidural site drains for more than 36-48 hours please call the anesthesia department.  QUESTIONS?:  Please feel free to call your physician or the hospital operator if you have any questions, and they will be happy to assist you.     [facility] Anesthesia Department [address] [place] [phone]

## 2011-08-09 NOTE — Pre-Procedure Instructions (Signed)
Dr Emelda Fear notified at 1540 on 08/08/2011 that patient sts surgery as being,"scraping my uterus of excess tissue. I am bleeding every day again and stopped my periods years ago". Surgery posted is for [facility] and hysteroscopy. Order for obtaining consent sts repair of recurrent cystocele. Pt denies that this is a problem.Left message for him to call back.Pt   Left at 1600  because she had waited for 45 minutes and needed to go.Reassured pt that if procedure to be done was different than what she understood it to be I would have Dr Emelda Fear call her at home. Dr Emelda Fear calls at 1615, sts he needs to fix consent order because pt is having D&C and hysteroscopy and not repair of rectocele. Will get consent signed am of surgery after new order is entered in.

## 2011-08-09 NOTE — Pre-Procedure Instructions (Signed)
Potassium 3.4. Shown to Dr Jonne Ply orders given.

## 2011-08-15 ENCOUNTER — Ambulatory Visit (HOSPITAL_COMMUNITY)
Admission: RE | Admit: 2011-08-15 | Discharge: 2011-08-16 | Disposition: A | Discharge status: Home / Self Care | Payer: Medicare Other | Source: Ambulatory Visit | Attending: Obstetrics and Gynecology | Admitting: Obstetrics and Gynecology

## 2011-08-15 ENCOUNTER — Encounter (HOSPITAL_COMMUNITY): Payer: Self-pay | Admitting: Anesthesiology

## 2011-08-15 ENCOUNTER — Ambulatory Visit (HOSPITAL_COMMUNITY): Payer: Medicare Other | Admitting: Anesthesiology

## 2011-08-15 ENCOUNTER — Encounter (HOSPITAL_COMMUNITY)
Admission: RE | Disposition: A | Discharge status: Home / Self Care | Payer: Self-pay | Source: Ambulatory Visit | Attending: Obstetrics and Gynecology

## 2011-08-15 ENCOUNTER — Encounter (HOSPITAL_COMMUNITY): Payer: Self-pay | Admitting: Obstetrics and Gynecology

## 2011-08-15 ENCOUNTER — Other Ambulatory Visit: Payer: Self-pay | Admitting: Obstetrics and Gynecology

## 2011-08-15 ENCOUNTER — Ambulatory Visit (HOSPITAL_COMMUNITY): Payer: Medicare Other

## 2011-08-15 DIAGNOSIS — I509 Heart failure, unspecified: Secondary | ICD-10-CM | POA: Insufficient documentation

## 2011-08-15 DIAGNOSIS — N950 Postmenopausal bleeding: Secondary | ICD-10-CM | POA: Insufficient documentation

## 2011-08-15 DIAGNOSIS — J984 Other disorders of lung: Secondary | ICD-10-CM | POA: Diagnosis present

## 2011-08-15 DIAGNOSIS — Z79899 Other long term (current) drug therapy: Secondary | ICD-10-CM | POA: Insufficient documentation

## 2011-08-15 DIAGNOSIS — I10 Essential (primary) hypertension: Secondary | ICD-10-CM | POA: Insufficient documentation

## 2011-08-15 DIAGNOSIS — Z01810 Encounter for preprocedural cardiovascular examination: Secondary | ICD-10-CM | POA: Insufficient documentation

## 2011-08-15 DIAGNOSIS — C549 Malignant neoplasm of corpus uteri, unspecified: Secondary | ICD-10-CM | POA: Insufficient documentation

## 2011-08-15 DIAGNOSIS — Z01812 Encounter for preprocedural laboratory examination: Secondary | ICD-10-CM | POA: Insufficient documentation

## 2011-08-15 HISTORY — DX: Chronic obstructive pulmonary disease, unspecified: J44.9

## 2011-08-15 HISTORY — PX: HYSTEROSCOPY W/D&C: SHX1775

## 2011-08-15 HISTORY — DX: Morbid (severe) obesity due to excess calories: E66.01

## 2011-08-15 SURGERY — DILATATION AND CURETTAGE /HYSTEROSCOPY
Anesthesia: General | Wound class: Clean Contaminated

## 2011-08-15 MED ORDER — SUCCINYLCHOLINE CHLORIDE 20 MG/ML IJ SOLN
INTRAMUSCULAR | Status: DC | PRN
  Administered 2011-08-15: 140 mg via INTRAVENOUS

## 2011-08-15 MED ORDER — MIDAZOLAM HCL 2 MG/2ML IJ SOLN: 1.0000 mg | INTRAMUSCULAR | Status: DC | PRN

## 2011-08-15 MED ORDER — GLYCOPYRROLATE 0.2 MG/ML IJ SOLN
INTRAMUSCULAR | Status: AC
  Filled 2011-08-15: qty 1

## 2011-08-15 MED ORDER — ONDANSETRON HCL 4 MG PO TABS
4.0000 mg | ORAL_TABLET | Freq: Four times a day (QID) | ORAL | Status: DC | PRN
  Filled 2011-08-15: qty 1

## 2011-08-15 MED ORDER — SUCCINYLCHOLINE CHLORIDE 20 MG/ML IJ SOLN
INTRAMUSCULAR | Status: AC
  Filled 2011-08-15: qty 1

## 2011-08-15 MED ORDER — FUROSEMIDE 10 MG/ML IJ SOLN
INTRAMUSCULAR | Status: AC
  Filled 2011-08-15: qty 4

## 2011-08-15 MED ORDER — ONDANSETRON HCL 4 MG/2ML IJ SOLN: 4.0000 mg | Freq: Four times a day (QID) | INTRAMUSCULAR | Status: DC | PRN

## 2011-08-15 MED ORDER — CEFAZOLIN SODIUM-DEXTROSE 2-3 GM-% IV SOLR: 2.0000 g | INTRAVENOUS | Status: DC

## 2011-08-15 MED ORDER — LIDOCAINE HCL 1 % IJ SOLN
INTRAMUSCULAR | Status: DC | PRN
  Administered 2011-08-15: 20 mg via INTRADERMAL

## 2011-08-15 MED ORDER — ALBUTEROL SULFATE (5 MG/ML) 0.5% IN NEBU
INHALATION_SOLUTION | RESPIRATORY_TRACT | Status: AC
  Administered 2011-08-15: 2.5 mg via RESPIRATORY_TRACT
  Filled 2011-08-15: qty 0.5

## 2011-08-15 MED ORDER — DEXAMETHASONE SODIUM PHOSPHATE 4 MG/ML IJ SOLN
4.0000 mg | Freq: Once | INTRAMUSCULAR | Status: AC
  Administered 2011-08-15: 4 mg via INTRAVENOUS

## 2011-08-15 MED ORDER — PROPOFOL 10 MG/ML IV EMUL
INTRAVENOUS | Status: AC
  Filled 2011-08-15: qty 20

## 2011-08-15 MED ORDER — HYDRALAZINE HCL 20 MG/ML IJ SOLN
INTRAMUSCULAR | Status: AC
  Filled 2011-08-15: qty 1

## 2011-08-15 MED ORDER — SODIUM CHLORIDE 0.9 % IJ SOLN
INTRAMUSCULAR | Status: AC
  Administered 2011-08-15: 10 mL
  Filled 2011-08-15: qty 10

## 2011-08-15 MED ORDER — MORPHINE SULFATE 4 MG/ML IJ SOLN: 1.0000 mg | INTRAMUSCULAR | Status: DC | PRN

## 2011-08-15 MED ORDER — GLYCOPYRROLATE 0.2 MG/ML IJ SOLN
INTRAMUSCULAR | Status: DC | PRN
  Administered 2011-08-15: .4 mg via INTRAVENOUS

## 2011-08-15 MED ORDER — CEFAZOLIN SODIUM 1-5 GM-% IV SOLN
INTRAVENOUS | Status: AC
  Administered 2011-08-15 (×2): 1 g via INTRAVENOUS
  Filled 2011-08-15: qty 100

## 2011-08-15 MED ORDER — GLYCOPYRROLATE 0.2 MG/ML IJ SOLN
0.1000 mg | Freq: Once | INTRAMUSCULAR | Status: AC
  Administered 2011-08-15: 0.3 mg via INTRAVENOUS

## 2011-08-15 MED ORDER — ROCURONIUM BROMIDE 100 MG/10ML IV SOLN
INTRAVENOUS | Status: DC | PRN
  Administered 2011-08-15: 5 mg via INTRAVENOUS

## 2011-08-15 MED ORDER — FENTANYL CITRATE 0.05 MG/ML IJ SOLN
INTRAMUSCULAR | Status: AC
  Filled 2011-08-15: qty 2

## 2011-08-15 MED ORDER — LACTATED RINGERS IV SOLN
INTRAVENOUS | Status: DC
  Administered 2011-08-15: 09:00:00 via INTRAVENOUS

## 2011-08-15 MED ORDER — SODIUM CHLORIDE 0.9 % IV SOLN
INTRAVENOUS | Status: DC
  Administered 2011-08-15: 15:00:00 via INTRAVENOUS

## 2011-08-15 MED ORDER — GLYCOPYRROLATE 0.2 MG/ML IJ SOLN
0.2000 mg | Freq: Once | INTRAMUSCULAR | Status: AC
  Administered 2011-08-15: 09:00:00 via INTRAVENOUS

## 2011-08-15 MED ORDER — ALBUTEROL SULFATE (5 MG/ML) 0.5% IN NEBU
2.5000 mg | INHALATION_SOLUTION | Freq: Four times a day (QID) | RESPIRATORY_TRACT | Status: DC | PRN
  Administered 2011-08-15: 2.5 mg via RESPIRATORY_TRACT
  Filled 2011-08-15: qty 20

## 2011-08-15 MED ORDER — ONDANSETRON HCL 4 MG/2ML IJ SOLN
INTRAMUSCULAR | Status: AC
  Filled 2011-08-15: qty 2

## 2011-08-15 MED ORDER — HYDRALAZINE HCL 20 MG/ML IJ SOLN
INTRAMUSCULAR | Status: DC | PRN
  Administered 2011-08-15: 5 mg via INTRAVENOUS

## 2011-08-15 MED ORDER — OXYCODONE-ACETAMINOPHEN 5-325 MG PO TABS: 1.0000 | ORAL_TABLET | ORAL | Status: DC | PRN

## 2011-08-15 MED ORDER — ROCURONIUM BROMIDE 50 MG/5ML IV SOLN
INTRAVENOUS | Status: AC
  Filled 2011-08-15: qty 1

## 2011-08-15 MED ORDER — GLYCOPYRROLATE 0.2 MG/ML IJ SOLN
INTRAMUSCULAR | Status: AC
  Administered 2011-08-15: 0.3 mg via INTRAVENOUS
  Filled 2011-08-15: qty 1

## 2011-08-15 MED ORDER — NEOSTIGMINE METHYLSULFATE 1 MG/ML IJ SOLN
INTRAMUSCULAR | Status: DC | PRN
  Administered 2011-08-15: 2 mg via INTRAVENOUS

## 2011-08-15 MED ORDER — FUROSEMIDE 10 MG/ML IJ SOLN
40.0000 mg | Freq: Once | INTRAMUSCULAR | Status: AC
  Administered 2011-08-15: 40 mg via INTRAVENOUS

## 2011-08-15 MED ORDER — DEXAMETHASONE SODIUM PHOSPHATE 4 MG/ML IJ SOLN
INTRAMUSCULAR | Status: AC
  Filled 2011-08-15: qty 1

## 2011-08-15 MED ORDER — LIDOCAINE HCL (PF) 1 % IJ SOLN
INTRAMUSCULAR | Status: AC
  Filled 2011-08-15: qty 5

## 2011-08-15 MED ORDER — PROPOFOL 10 MG/ML IV EMUL
INTRAVENOUS | Status: DC | PRN
  Administered 2011-08-15: 50 mg via INTRAVENOUS
  Administered 2011-08-15: 150 mg via INTRAVENOUS

## 2011-08-15 MED ORDER — FENTANYL CITRATE 0.05 MG/ML IJ SOLN: 25.0000 ug | INTRAMUSCULAR | Status: DC | PRN

## 2011-08-15 MED ORDER — GLYCOPYRROLATE 0.2 MG/ML IJ SOLN: 0.1000 mg | Freq: Once | INTRAMUSCULAR | Status: DC

## 2011-08-15 MED ORDER — FENTANYL CITRATE 0.05 MG/ML IJ SOLN
INTRAMUSCULAR | Status: DC | PRN
  Administered 2011-08-15 (×2): 50 ug via INTRAVENOUS

## 2011-08-15 MED ORDER — SCOPOLAMINE 1 MG/3DAYS TD PT72
MEDICATED_PATCH | TRANSDERMAL | Status: AC
  Filled 2011-08-15: qty 1

## 2011-08-15 MED ORDER — SODIUM CHLORIDE 0.9 % IN NEBU
INHALATION_SOLUTION | RESPIRATORY_TRACT | Status: AC
  Filled 2011-08-15: qty 3

## 2011-08-15 MED ORDER — SCOPOLAMINE 1 MG/3DAYS TD PT72
1.0000 | MEDICATED_PATCH | Freq: Once | TRANSDERMAL | Status: DC
  Administered 2011-08-15: 1.5 mg via TRANSDERMAL

## 2011-08-15 MED ORDER — PANTOPRAZOLE SODIUM 40 MG IV SOLR
40.0000 mg | Freq: Every day | INTRAVENOUS | Status: DC
  Administered 2011-08-15: 40 mg via INTRAVENOUS
  Filled 2011-08-15 (×2): qty 40

## 2011-08-15 MED ORDER — ALBUTEROL SULFATE HFA 108 (90 BASE) MCG/ACT IN AERS
INHALATION_SPRAY | RESPIRATORY_TRACT | Status: AC
  Filled 2011-08-15: qty 6.7

## 2011-08-15 MED ORDER — MIDAZOLAM HCL 2 MG/2ML IJ SOLN
INTRAMUSCULAR | Status: AC
  Filled 2011-08-15: qty 2

## 2011-08-15 MED ORDER — ONDANSETRON HCL 4 MG/2ML IJ SOLN
4.0000 mg | Freq: Once | INTRAMUSCULAR | Status: AC
  Administered 2011-08-15: 4 mg via INTRAVENOUS

## 2011-08-15 MED ORDER — ONDANSETRON HCL 4 MG/2ML IJ SOLN: 4.0000 mg | Freq: Once | INTRAMUSCULAR | Status: DC | PRN

## 2011-08-15 MED ORDER — SODIUM CHLORIDE 0.9 % IR SOLN
Status: DC | PRN
  Administered 2011-08-15: 1000 mL
  Administered 2011-08-15: 3000 mL

## 2011-08-15 MED ORDER — ALBUTEROL SULFATE HFA 108 (90 BASE) MCG/ACT IN AERS
INHALATION_SPRAY | RESPIRATORY_TRACT | Status: DC | PRN
  Administered 2011-08-15: 3 via RESPIRATORY_TRACT
  Administered 2011-08-15 (×2): 2 via RESPIRATORY_TRACT

## 2011-08-15 SURGICAL SUPPLY — 24 items
BAG HAMPER (MISCELLANEOUS) ×2 IMPLANT
CLOTH BEACON ORANGE TIMEOUT ST (SAFETY) ×2 IMPLANT
COVER LIGHT HANDLE STERIS (MISCELLANEOUS) ×4 IMPLANT
DECANTER SPIKE VIAL GLASS SM (MISCELLANEOUS) IMPLANT
FORMALIN 10 PREFIL 120ML (MISCELLANEOUS) ×2 IMPLANT
GLOVE ECLIPSE 6.5 STRL STRAW (GLOVE) ×2 IMPLANT
GLOVE ECLIPSE 9.0 STRL (GLOVE) ×2 IMPLANT
GLOVE EXAM NITRILE XS STR PU (GLOVE) ×2 IMPLANT
GLOVE INDICATOR 7.0 STRL GRN (GLOVE) ×2 IMPLANT
GLOVE INDICATOR STER SZ 9 (GLOVE) ×2 IMPLANT
GOWN BRE IMP SLV AUR XL STRL (GOWN DISPOSABLE) ×2 IMPLANT
GOWN STRL REIN 3XL LVL4 (GOWN DISPOSABLE) ×2 IMPLANT
INST SET HYSTEROSCOPY (KITS) ×2 IMPLANT
IV NS IRRIG 3000ML ARTHROMATIC (IV SOLUTION) ×2 IMPLANT
KIT ROOM TURNOVER APOR (KITS) ×2 IMPLANT
MANIFOLD NEPTUNE II (INSTRUMENTS) ×2 IMPLANT
NS IRRIG 1000ML POUR BTL (IV SOLUTION) ×2 IMPLANT
PACK PERI GYN (CUSTOM PROCEDURE TRAY) ×2 IMPLANT
PAD ARMBOARD 7.5X6 YLW CONV (MISCELLANEOUS) ×2 IMPLANT
PAD TELFA 3X4 1S STER (GAUZE/BANDAGES/DRESSINGS) ×2 IMPLANT
SET BASIN LINEN APH (SET/KITS/TRAYS/PACK) ×2 IMPLANT
SET BERKELEY SUCTION TUBING (SUCTIONS) ×2 IMPLANT
SET CYSTO W/LG BORE CLAMP LF (SET/KITS/TRAYS/PACK) ×2 IMPLANT
VACURETTE 7MM CVD STRL WRAP (CANNULA) ×2 IMPLANT

## 2011-08-15 NOTE — Anesthesia Preprocedure Evaluation (Addendum)
Anesthesia Evaluation  Name, MR# and DOB Patient awake  General Assessment Comment  Reviewed: Allergy & Precautions, H&P , NPO status , Patient's Chart, lab work & pertinent test results, reviewed documented beta blocker date and time   History of Anesthesia Complications (+) PONV  Airway Mallampati: II  Neck ROM: Full    Dental  (+) Partial Upper and Partial Lower   Pulmonary (+) shortness of breath COPD   Pulmonary exam normal       Cardiovascular hypertension, Pt. on medications + Orthopnea Regular Normal    Neuro/Psych    GI/Hepatic hiatal hernia GERD   Endo/Other  Morbid obesity  Renal/GU      Musculoskeletal   Abdominal   Peds  Hematology   Anesthesia Other Findings   Reproductive/Obstetrics                           Anesthesia Physical Anesthesia Plan  ASA: III  Anesthesia Plan: General   Post-op Pain Management:    Induction: Intravenous, Rapid sequence and Cricoid pressure planned  Airway Management Planned: Oral ETT  Additional Equipment:   Intra-op Plan:   Post-operative Plan: Extubation in OR  Informed Consent: I have reviewed the patients History and Physical, chart, labs and discussed the procedure including the risks, benefits and alternatives for the proposed anesthesia with the patient or authorized representative who has indicated his/her understanding and acceptance.     Plan Discussed with:   Anesthesia Plan Comments:         Anesthesia Quick Evaluation

## 2011-08-15 NOTE — Anesthesia Postprocedure Evaluation (Addendum)
  Anesthesia Post-op Note  Patient: [name]  Procedure(s) Performed:  DILATATION AND CURETTAGE (D&C) /HYSTEROSCOPY - With Suction Curette  Patient Location: PACU  Anesthesia Type: General  Level of Consciousness: awake and oriented  Airway and Oxygen Therapy: Patient Spontanous Breathing and Patient connected to nasal cannula oxygen  Post-op Pain: none  Post-op Assessment: Patient's Cardiovascular Status Stable, RESPIRATORY FUNCTION UNSTABLE, Patent Airway and No signs of Nausea or vomiting  Post-op Vital Signs: stable  Complications: No apparent anesthesia complications. Pt w/ wheezing ans SOB post op. Rx'd with  Albuterol nebs , robinul, cough & deep breathing. Pt improved and less SOB but O2 sat on [place] still around 90-92%. CXR showed her usual ATX bilaterally with some mild edema. Given Lasix 40mg  to improve breathing and will be admitted overnight for observation. 08/16/2011 1355 Pt. Discharged today. Chart reviewed and pt.stable. T.Yakelin Grenier CRNA 08/16/11 1357,

## 2011-08-15 NOTE — Anesthesia Procedure Notes (Signed)
Procedure Name: Intubation Date/Time: 08/15/2011 10:21 AM Performed by: Minerva Areola Pre-anesthesia Checklist: Patient identified, Patient being monitored, Timeout performed, Emergency Drugs available and Suction available Patient Re-evaluated:Patient Re-evaluated prior to inductionOxygen Delivery Method: Circle System Utilized Preoxygenation: Pre-oxygenation with 100% oxygen Intubation Type: Rapid sequence and Circoid Pressure applied Laryngoscope Size: Miller and 2 Grade View: Grade I Tube type: Oral Tube size: 7.0 mm Number of attempts: 1 Airway Equipment and Method: stylet Placement Confirmation: ETT inserted through vocal cords under direct vision,  positive ETCO2 and breath sounds checked- equal and bilateral Secured at: 21 cm Tube secured with: Tape Dental Injury: Teeth and Oropharynx as per pre-operative assessment

## 2011-08-15 NOTE — Brief Op Note (Signed)
08/15/2011  10:51 AM  PATIENT:  [name]  70 y.o. female  PRE-OPERATIVE DIAGNOSIS:  postmenopausal bleeding  POST-OPERATIVE DIAGNOSIS:  postmenopausal bleeding  PROCEDURE:  Procedure(s): DILATATION AND CURETTAGE (D&C) /HYSTEROSCOPY  SURGEON:  Surgeon(s): Tilda Burrow, MD  PHYSICIAN ASSISTANT:   ASSISTANTS: none   ANESTHESIA:   general  OR FLUID I/O:  Total I/O In: 200 [I.V.:200] Out: 25 [Blood:25]  BLOOD ADMINISTERED:none  DRAINS: none   LOCAL MEDICATIONS USED:  NONE  SPECIMEN:  Source of Specimen:  endometrium  DISPOSITION OF SPECIMEN:  PATHOLOGY  COUNTS:  YES  TOURNIQUET:  * No tourniquets in log *  DICTATION: .Dragon Dictation  PLAN OF CARE: Discharge to home after PACU  PATIENT DISPOSITION:  PACU - hemodynamically stable.   Delay start of Pharmacological VTE agent (>24hrs) due to surgical blood loss or risk of bleeding:  not applicable

## 2011-08-15 NOTE — Progress Notes (Signed)
Xray in to do c-xray

## 2011-08-15 NOTE — Progress Notes (Signed)
Day of Surgery Procedure(s): DILATATION AND CURETTAGE (D&C) /HYSTEROSCOPY  Subjective: Patient reports that she remains short of breath and recovering. At 1:38 PM she has received albuterol therapy, had chest x-ray performed which shows cardiac enlargement and some suggestion of pulmonary edema suggesting possible current congestive heart failure. Review by Dr. Marcos Eke of her old chest x-ray shows minimal changes. Oxygen saturation remains at 92% on 2 L per nasal cannula. Given the patient's overall fragile medical condition, will observe on telemetry bed 8, and Lasix has been given at this time   Objective: I have reviewed patient's vital signs and intake and output. The patient only received 100 cc IV fluids Intra-Op and there was minimal suspected unaccounted fluid lost during the hysteroscopy. Patient's condition more likely reflects her underlying fragility BP 144/66  Pulse 75  Temp(Src) 97.5 F (36.4 C) (Oral)  Resp 19  SpO2 90% Patient is on 2 L oxygen per nasal cannula  General: appears older than stated age, morbidly obese and Mildly uncomfortable and anxious Resp: clear to auscultation bilaterally  Assessment: s/p Procedure(s): DILATATION AND CURETTAGE (D&C) /HYSTEROSCOPY: , Possible mild fluid overload secondary to overall fragility and deconditioning  Plan: Clear liquids Fluid restriction admission to telemetry bed overnight  LOS: 0 days    Commodore Bellew V 08/15/2011, 1:37 PM

## 2011-08-15 NOTE — Progress Notes (Signed)
Did not sign off to [employer] rn before. Now reporting off to susan sparks rn

## 2011-08-15 NOTE — Progress Notes (Signed)
Reported off to [employer]

## 2011-08-15 NOTE — Transfer of Care (Signed)
Immediate Anesthesia Transfer of Care Note  Patient: [name]  Procedure(s) Performed:  DILATATION AND CURETTAGE (D&C) /HYSTEROSCOPY - With Suction Curette  Patient Location: PACU  Anesthesia Type: General  Level of Consciousness: awake, alert  and patient cooperative  Airway & Oxygen Therapy: Patient Spontanous Breathing and non-rebreather face mask  Post-op Assessment: Report given to PACU RN, Post -op Vital signs reviewed and stable and Patient moving all extremities X 4  Post vital signs: Reviewed and stable  Complications: No apparent anesthesia complications O2 sat 93%

## 2011-08-15 NOTE — H&P (Addendum)
[name] is an 70 y.o. female.who is admitted for Hysteroscopy, Dilation and curettage for a large thickened endometrium which has been recommended for endometrial biopsy,  Along with uterine curettage. Pt has had PMPB since the summer and has avoided biopsy until today.  Risks of procedure reviewed. Pt is aware is that follow up surgery may be required , depending on findings of hysteroscopy and tissue samples  Pertinent Gynecological History: Menses: post-menopausal Bleeding: post menopausal bleeding Contraception: rhythm method DES exposure: denies Blood transfusions: none Sexually transmitted diseases: no past history Previous GYN Procedures: cryo  Last mammogram: normal Date: 2012 Last pap: normal Date: 02/2010,  OB History: G5, P5   Menstrual History Menarche age: young, pt has been very willing of correct the existing diet, and canbe absorbed, No LMP recorded. Patient is postmenopausal.    Past Medical History  Diagnosis Date  . Arthritis   . Asthma   . Hypertension   . PONV (postoperative nausea and vomiting)   . Shortness of breath   . GERD (gastroesophageal reflux disease)   . Hiatal hernia   . Obesity, morbid (more than 100 lbs over ideal weight or BMI > 40) 08/2011    Ht 5'7", wt 280 lb  . Bronchitis, chronic obstructive since 2000's    Past Surgical History  Procedure Date  . Shoulder surgery     right -removal of bone spur  . Rectocele repair     Family History  Problem Relation Age of Onset  . Anesthesia problems Neg Hx   . Hypotension Neg Hx   . Malignant hyperthermia Neg Hx   . Pseudochol deficiency Neg Hx     Social History:  reports that she has never smoked. She does not have any smokeless tobacco history on file. She reports that she does not drink alcohol or use illicit drugs.  Allergies:  Allergies  Allergen Reactions  . Carbamazepine     No prescriptions prior to admission   pt 's ros normalROSReview of Systems - Negative  except deconditioned.  There were no vitals taken for this visit.Marland KitchenPhysical ExamPhysical Examination: General appearance - alert, well appearing, and in no distress, oriented to person, place, and time, normal appearing weight, playful, active, well hydrated  Eyes - pupils equal and reactive, extraocular eye movements intact, left eye normal, right eye normal Ears - bilateral TM's and external ear canals normal, right ear normal, left ear normal, cerumen removed Neck - supple, no significant adenopathy, bilateral symmetric anterior adenopathy Abdomen - soft, nontender, nondistended, no masses or organomegaly Neurological - alert, oriented, normal speech, no focal findings or movement disorder noted, hemiparesis on right No results found for this or any previous visit (from the past 24 hour(s)).Asess.: Pt is stable for procedure. Krames visual guides used in the office. The risks of such procedure is reasuring   No results found.I have reviewed all the lab results.  Postmenopausal bleeding, for hysteroscopy, dilation and currettage, for tissue diagnosis Risks of procedure reviewed. Patient examined and no changes in care plan.  Samra Pesch V 08/15/2011, 12:59 AM

## 2011-08-15 NOTE — OR Nursing (Signed)
Up to bathroom to void 

## 2011-08-15 NOTE — Progress Notes (Signed)
Patient having issues with breathing on arrival to pacu. Neb treatment and robinul given per order.

## 2011-08-15 NOTE — Progress Notes (Signed)
Patient breathing not as labored but still having difficulty breathing states still feels tight. Dr Jayme Cloud order stat portable  c-xray.

## 2011-08-15 NOTE — Progress Notes (Signed)
Dr Jayme Cloud and dr Emelda Fear consulted on patient. Dr Jayme Cloud ordered 40mg  lasix and dr Emelda Fear to admit patient for observation.

## 2011-08-15 NOTE — Progress Notes (Signed)
Dr Emelda Fear aware of xray results. Told to keep patient until he can see her.

## 2011-08-15 NOTE — Op Note (Signed)
06/30/2011  9:29 AM  PATIENT:    PRE-OPERATIVE DIAGNOSIS: Postmenopausal bleeding  POST-OPERATIVE DIAGNOSIS:  Same  PROCEDURE:  Procedure(s): Hysteroscopy dilation and curettage, using suction curette 7 mm  SURGEON:Winthrop Shannahan Benancio Deeds, MD  PHYSICIAN ASSISTANT: None  ADDITIONAL ASSISTANTS: none   ANESTHESIA:   general  ESTIMATED BLOOD LOSS: Minimal   BLOOD ADMINISTERED:none  DRAINS: none   LOCAL MEDICATIONS USED:  NONE  SPECIMEN:  Source of Specimen:  Endometrium  DISPOSITION OF SPECIMEN:  PATHOLOGY  COUNTS:  YES  TOURNIQUET:  * No tourniquets in log *  DICTATION #: The patient was taken to the operating room prepped and draped for vaginal procedure timeout was conducted and procedure confirmed by all parties. IV antibiotics administered and beta blocker confirmed. Speculum was inserted and the cervix grasped with single-tooth tenaculum, and sounded to 9 cm. Dilation to 25 [place] follow and rigid 30 hysteroscope was then placed inside the uterine cavity where it was easy to visualize large chunks of solid appearing tissue. Smooth sharp curettage was attempted with only minimal tissue sample obtained. Suction curet was requested and 7 mm suction curet used under 35 mm suction pressure to remove probably 10 cc of tissue, with tissue fragments as large as 2 cm in length that occluded the 7 mm trocar. Hysteroscopy was repeated to confirm that the endometrial cavity was largely evacuated sponge and needle counts were correct. procedure considered completed. Minimal EBL patient to recovery room in good condition  PLAN OF CARE: Discharge home  PATIENT DISPOSITION:  PACU - hemodynamically stable.   Delay start of Pharmacological VTE agent (>24hrs) due to surgical blood loss or risk of bleeding:  not applicable

## 2011-08-16 DIAGNOSIS — J984 Other disorders of lung: Secondary | ICD-10-CM | POA: Diagnosis present

## 2011-08-16 LAB — CBC
HCT: 47.6 % — ABNORMAL HIGH (ref 36.0–46.0)
Hemoglobin: 15.6 g/dL — ABNORMAL HIGH (ref 12.0–15.0)
MCH: 27 pg (ref 26.0–34.0)
MCV: 82.4 fL (ref 78.0–100.0)
RBC: 5.78 MIL/uL — ABNORMAL HIGH (ref 3.87–5.11)

## 2011-08-16 LAB — COMPREHENSIVE METABOLIC PANEL
BUN: 11 mg/dL (ref 6–23)
CO2: 35 mEq/L — ABNORMAL HIGH (ref 19–32)
Calcium: 9.3 mg/dL (ref 8.4–10.5)
Creatinine, Ser: 0.6 mg/dL (ref 0.50–1.10)
GFR calc Af Amer: >90 mL/min (ref >90)
GFR calc non Af Amer: 90 mL/min — ABNORMAL LOW (ref >90)
Glucose, Bld: 102 mg/dL — ABNORMAL HIGH (ref 70–99)
Total Bilirubin: 0.5 mg/dL (ref 0.3–1.2)

## 2011-08-16 MED ORDER — OXYCODONE-ACETAMINOPHEN 5-325 MG PO TABS: 1.0000 | ORAL_TABLET | ORAL | Status: AC | PRN

## 2011-08-16 NOTE — Progress Notes (Signed)
1 Day Post-Op Procedure(s): DILATATION AND CURETTAGE (D&C) /HYSTEROSCOPY  Subjective: Patient reports no problems voiding and no pain. There is lite bleeding    Objective: I have reviewed patient's vital signs, medications and labs.  General: alert and no distress Resp: clear to auscultation bilaterally Vaginal Bleeding: minimal  Assessment: s/p Procedure(s): DILATATION AND CURETTAGE (D&C) /HYSTEROSCOPY: stable and resolved postoperative bleeding difficulties, possible fluid overload , congestive heart disease  Plan: Discharge home  LOS: 1 day    Dennis Hegeman V 08/16/2011, 8:58 AM

## 2011-08-16 NOTE — Progress Notes (Signed)
Patient received discharge instructions along with follow up appointments. Patient verbalized understanding of all instructions. Patient was escorted by staff via wheelchair to vehicle. Patient discharged to home in stable condition. 

## 2011-08-16 NOTE — Discharge Summary (Signed)
Physician Discharge Summary  Patient ID: AUBRIONNA ISTRE MRN: [id number] DOB/AGE: 71-Jul-1942 71 y.o.  Admit date: 08/15/2011 Discharge date: 08/16/2011  Admission Diagnoses:postmenopausal bleeding  Discharge Diagnoses: same Principal Problem:  *Postmenopausal bleeding Active Problems:  Chronic lung disease   Discharged Condition: stable  Hospital Course: Poor oxygenation postop after surgery required overnight observation and fluid restriction. Patient resumed her baseline status by morning Consults: none  Significant Diagnostic Studies: labs: Sao2, 90-92 % on room air  Treatments: Dilation and Curettage,                        Overnight observation Discharge Exam: Blood pressure 132/73, pulse 75, temperature 98.5 F (36.9 C), temperature source Oral, resp. rate 18, height 5\' 4"  (1.626 m), weight 127.461 kg (281 lb), SpO2 96.00%. Resp: clear to auscultation bilaterally, abdomen soft, legs nontender  Disposition: Home or Self Care   Current Discharge Medication List    START taking these medications   Details  oxyCODONE-acetaminophen (PERCOCET) 5-325 MG per tablet Take 1-2 tablets by mouth every 3 (three) hours as needed (moderate to severe pain (when tolerating fluids)). Qty: 30 tablet, Refills: 0      CONTINUE these medications which have NOT CHANGED   Details  atenolol (TENORMIN) 50 MG tablet Take 25 mg by mouth daily before supper.     budesonide-formoterol (SYMBICORT) 160-4.5 MCG/ACT inhaler Inhale 2 puffs into the lungs 2 (two) times daily as needed.      Calcium Carbonate-Vitamin D (CALCIUM 600/VITAMIN D) 600-400 MG-UNIT per tablet Take 1 tablet by mouth daily.      celecoxib (CELEBREX) 200 MG capsule Take 200 mg by mouth daily.      cloNIDine (CATAPRES) 0.3 MG tablet Take 0.3 mg by mouth at bedtime.      doxylamine, Sleep, (UNISOM) 25 MG tablet Take 25 mg by mouth at bedtime.      esomeprazole (NEXIUM) 40 MG capsule Take 40 mg by mouth 2 (two) times  daily.      furosemide (LASIX) 40 MG tablet Take 40 mg by mouth daily.      hydrochlorothiazide 25 MG tablet Take 25 mg by mouth daily.      simethicone (MYLICON) 125 MG chewable tablet Chew 125 mg by mouth daily.        STOP taking these medications     furosemide (LASIX) 10 MG/ML solution      zolpidem (AMBIEN CR) 12.5 MG CR tablet        Follow-up Information    Follow up with Tilda Burrow, MD in 1 week. (as scheduled already, earlier as needed)    Contact information:   [facility] [address], [address] [place] [address] [phone]          Signed: Tilda Burrow 08/16/2011, 9:10 AM

## 2011-08-18 ENCOUNTER — Encounter (HOSPITAL_COMMUNITY): Payer: Self-pay | Admitting: Obstetrics and Gynecology

## 2011-08-31 ENCOUNTER — Ambulatory Visit: Payer: Medicare Other | Attending: Gynecologic Oncology | Admitting: Gynecologic Oncology

## 2011-08-31 DIAGNOSIS — Z8049 Family history of malignant neoplasm of other genital organs: Secondary | ICD-10-CM | POA: Insufficient documentation

## 2011-08-31 DIAGNOSIS — N393 Stress incontinence (female) (male): Secondary | ICD-10-CM | POA: Insufficient documentation

## 2011-08-31 DIAGNOSIS — I10 Essential (primary) hypertension: Secondary | ICD-10-CM | POA: Insufficient documentation

## 2011-08-31 DIAGNOSIS — J45909 Unspecified asthma, uncomplicated: Secondary | ICD-10-CM | POA: Insufficient documentation

## 2011-08-31 DIAGNOSIS — Z801 Family history of malignant neoplasm of trachea, bronchus and lung: Secondary | ICD-10-CM | POA: Insufficient documentation

## 2011-08-31 DIAGNOSIS — Z79899 Other long term (current) drug therapy: Secondary | ICD-10-CM | POA: Insufficient documentation

## 2011-08-31 DIAGNOSIS — C549 Malignant neoplasm of corpus uteri, unspecified: Secondary | ICD-10-CM | POA: Insufficient documentation

## 2011-09-01 NOTE — Consult Note (Signed)
NAME:  HARMONEE, TOZER NO.:  [id number]  MEDICAL RECORD NO.:  [id number]  LOCATION:  GYN                          FACILITY:  [facility]  PHYSICIAN:  Laurette Schimke, MD     DATE OF BIRTH:  February 12, 1941  DATE OF CONSULTATION:  08/31/2011 DATE OF DISCHARGE:                                CONSULTATION   REASON FOR VISIT:  Consult was requested by Dr. Emelda Fear for evaluation management of a FIGO grade 2 endometrial cancer.  HISTORY OF PRESENT ILLNESS:  This is a 70 year old gravida 5, para 5, last normal menstrual period in her 23s.  She was started on Celebrex approximately 1 year ago.  Approximately 7 to 8 months later, she noted some light vaginal spotting, the bleeding increased, and was associated with an endometrial biopsy early in 2012 that was negative.  Given persistent spotting, a D and C and hysteroscopy was performed on August 15, 2011, that was consistent with a FIGO grade 2 endometrial carcinoma.  PAST MEDICAL HISTORY: 1. Hypertension for 4 years. 2. Asthma without any history of ER visits or admissions. 3. Chronic bronchitis, 3 pillow orthopnea. 4. Hiatal hernia.  PAST SURGICAL HISTORY:  Hiatal hernia repair in 2007.  PAST GYNECOLOGIC HISTORY:  NSVD x5 with a bladder suspension approximately 10 years ago.  No history of abnormal Pap tests.  ALLERGIES: 1. INHALATIONAL ANESTHETICS that cause significant emesis. 2. TEGRETOL associated with occipital headaches.  FAMILY HISTORY:  Father with lung cancer and 2 paternal cousins with uterine cancer, age unspecified.  SOCIAL HISTORY:  Ms. Adolph is married to her second husband for the last 51 years.  She denies tobacco or alcohol use, but is exposed to significant secondhand smoke.  REVIEW OF SYSTEMS:  Stress urinary incontinence.  Three pillow orthopnea, shortness of breath with exertion, right calf pain with ambulation.  Denies vaginal bleeding.  No hematuria or hematochezia. Denies weight loss,  nausea, or vomiting.  MEDICATIONS: 1. Atenolol 50 mg daily. 2. Symbicort 2 puffs p.r.n. 3. Calcium 600 with vitamin D daily. 4. Celebrex 200 mg daily. 5. Clonidine 0.3 mg daily. 6. Unisom 25 mg at bedtime. 7. Nexium 40 mg twice daily. 8. Furosemide 40 mg daily. 9. Hydrochlorothiazide 25 mg daily. 10.Simethicone 125 mg tablets daily.  PHYSICAL EXAMINATION:  GENERAL:  Well-developed, very pleasant female, in no acute distress. VITAL SIGNS:  Weight 279.2 pounds, height 5 feet 4 inches, blood pressure 124/76, pulse 80, temperature 98.6. CHEST:  Clear to auscultation.  Decreased breath sounds at bases. HEART:  Regular rate and rhythm. ABDOMEN:  Soft, nontender, obese, without any evidence of hernia or masses. PELVIC EXAMINATION:  Normal external genitalia, Bartholin's, urethra, and Skene's.  Significant vaginal descensus.  No blood in the vaginal vault.  No nodularity noted in the vaginal septum. RECTAL EXAMINATION:  Good anal sphincter tone without any masses. EXTREMITIES:  1 to 2+ edema bilaterally.  IMPRESSION:  Ms. Maharaj is a lovely 70 year old with a grade 2 endometrial cancer.  She was counseled regarding the preference for endometrial cancer staging; however, this would require pulmonary evaluation and clearance by her primary care practitioner.  Her history is notable for the fact that after the dilatation and curettage  and hysteroscopy, she had low O2 sats and required hospitalization overnight.  I have asked her to follow up with Dr. [name] and Dr. Ouida Sills as soon as possible.  Given this patient's orthopnea, it is unlikely that she would be able to tolerate Trendelenburg positioning or minimally invasive approach as such.  PLAN:  The plant is for an exploratory laparotomy, total abdominal hysterectomy, bilateral salpingo-oophorectomy with lymph node staging as technically feasible.  Another option is that of vaginal hysterectomy, but that limits evaluation of the lymph  nodes.  We await feedback from Dr. Ouida Sills and [name] requiring medical clearance.     Laurette Schimke, MD     WB/MEDQ  D:  08/31/2011  T:  09/01/2011  Job:  [id number]  cc:   Tilda Burrow, M.D. Fax: [phone]  Kingsley Callander. Ouida Sills, MD Fax: [phone]  Oneal Deputy. [name], M.D. Fax: [phone]  Telford Nab, R.N. [address] [place], [place] [address]  Electronically Signed by Laurette Schimke MD on 09/01/2011 10:50:28 AM

## 2011-09-08 ENCOUNTER — Other Ambulatory Visit: Payer: Self-pay | Admitting: Gynecologic Oncology

## 2011-09-08 ENCOUNTER — Encounter (HOSPITAL_COMMUNITY): Payer: Medicare Other

## 2011-09-08 ENCOUNTER — Encounter (HOSPITAL_COMMUNITY): Payer: Self-pay

## 2011-09-08 ENCOUNTER — Ambulatory Visit (HOSPITAL_COMMUNITY)
Admission: RE | Admit: 2011-09-08 | Discharge: 2011-09-08 | Disposition: A | Discharge status: Home / Self Care | Payer: Medicare Other | Source: Ambulatory Visit | Attending: Gynecologic Oncology | Admitting: Gynecologic Oncology

## 2011-09-08 DIAGNOSIS — Z01818 Encounter for other preprocedural examination: Secondary | ICD-10-CM | POA: Insufficient documentation

## 2011-09-08 DIAGNOSIS — C541 Malignant neoplasm of endometrium: Secondary | ICD-10-CM

## 2011-09-08 DIAGNOSIS — Z01812 Encounter for preprocedural laboratory examination: Secondary | ICD-10-CM | POA: Insufficient documentation

## 2011-09-08 DIAGNOSIS — C549 Malignant neoplasm of corpus uteri, unspecified: Secondary | ICD-10-CM | POA: Insufficient documentation

## 2011-09-08 LAB — DIFFERENTIAL
Basophils Absolute: 0.1 K/uL (ref 0.0–0.1)
Basophils Relative: 1 % (ref 0–1)
Eosinophils Absolute: 0.3 K/uL (ref 0.0–0.7)
Eosinophils Relative: 4 % (ref 0–5)
Lymphocytes Relative: 22 % (ref 12–46)
Lymphs Abs: 2 K/uL (ref 0.7–4.0)
Monocytes Absolute: 0.8 K/uL (ref 0.1–1.0)
Monocytes Relative: 8 % (ref 3–12)
Neutro Abs: 5.9 K/uL (ref 1.7–7.7)
Neutrophils Relative %: 65 % (ref 43–77)

## 2011-09-08 LAB — SURGICAL PCR SCREEN
MRSA, PCR: NEGATIVE
Staphylococcus aureus: NEGATIVE

## 2011-09-08 LAB — COMPREHENSIVE METABOLIC PANEL
Albumin: 3.6 g/dL (ref 3.5–5.2)
Alkaline Phosphatase: 91 U/L (ref 39–117)
BUN: 15 mg/dL (ref 6–23)
Chloride: 95 mEq/L — ABNORMAL LOW (ref 96–112)
Creatinine, Ser: 0.67 mg/dL (ref 0.50–1.10)
GFR calc Af Amer: >90 mL/min (ref >90)
Glucose, Bld: 58 mg/dL — ABNORMAL LOW (ref 70–99)
Potassium: 3.5 mEq/L (ref 3.5–5.1)
Total Bilirubin: 0.5 mg/dL (ref 0.3–1.2)
Total Protein: 7.8 g/dL (ref 6.0–8.3)

## 2011-09-08 LAB — CBC
HCT: 46.8 % — ABNORMAL HIGH (ref 36.0–46.0)
Hemoglobin: 15.9 g/dL — ABNORMAL HIGH (ref 12.0–15.0)
MCH: 27.3 pg (ref 26.0–34.0)
MCHC: 34 g/dL (ref 30.0–36.0)
MCV: 80.4 fL (ref 78.0–100.0)
Platelets: 575 K/uL — ABNORMAL HIGH (ref 150–400)
RBC: 5.82 MIL/uL — ABNORMAL HIGH (ref 3.87–5.11)
RDW: 14.9 % (ref 11.5–15.5)
WBC: 9.1 K/uL (ref 4.0–10.5)

## 2011-09-08 NOTE — Pre-Procedure Instructions (Signed)
20 [name]  09/08/2011   Your procedure is scheduled on:  09/12/11  Report to [facility] at 12:30 PM.  Call this number if you have problems the morning of surgery: [phone]   Remember:   Do not eat food:After Midnight.  Do not drink clear liquids: 4 Hours before arrival.MAY HAVE CLEAR LIQUIDS UNTIL 8:30AM  Take these medicines the morning of surgery with A SIP OF WATER: OMEPRAZOLE AND USE SYMBICORT   Do not wear jewelry, make-up or nail polish.  Do not wear lotions, powders, or perfumes. You may wear deodorant.  Do not shave 48 hours prior to surgery.  Do not bring valuables to the hospital.  Contacts, dentures or bridgework may not be worn into surgery.  Leave suitcase in the car. After surgery it may be brought to your room.  For patients admitted to the hospital, checkout time is 11:00 AM the day of discharge.   Patients discharged the day of surgery will not be allowed to drive home.  Name and phone number of your driver:HUSBAND  Special Instructions: CHG Shower Use Special Wash: 1/2 bottle night before surgery and 1/2 bottle morning of surgery.   Please read over the following fact sheets that you were given: Blood Transfusion Information, MRSA Information and Surgical Site Infection Prevention

## 2011-09-12 ENCOUNTER — Encounter (HOSPITAL_COMMUNITY): Payer: Self-pay | Admitting: Anesthesiology

## 2011-09-12 ENCOUNTER — Other Ambulatory Visit: Payer: Self-pay | Admitting: Gynecologic Oncology

## 2011-09-12 ENCOUNTER — Inpatient Hospital Stay (HOSPITAL_COMMUNITY)
Admission: RE | Admit: 2011-09-12 | Discharge: 2011-09-18 | DRG: 739 | Disposition: A | Discharge status: Home / Self Care | Payer: Medicare Other | Source: Ambulatory Visit | Attending: Obstetrics & Gynecology | Admitting: Obstetrics & Gynecology

## 2011-09-12 ENCOUNTER — Encounter (HOSPITAL_COMMUNITY)
Admission: RE | Disposition: A | Discharge status: Home / Self Care | Payer: Self-pay | Source: Ambulatory Visit | Attending: Obstetrics & Gynecology

## 2011-09-12 ENCOUNTER — Encounter (HOSPITAL_COMMUNITY): Payer: Self-pay | Admitting: *Deleted

## 2011-09-12 ENCOUNTER — Inpatient Hospital Stay (HOSPITAL_COMMUNITY): Payer: Medicare Other | Admitting: Anesthesiology

## 2011-09-12 DIAGNOSIS — I509 Heart failure, unspecified: Secondary | ICD-10-CM | POA: Diagnosis present

## 2011-09-12 DIAGNOSIS — R0602 Shortness of breath: Secondary | ICD-10-CM | POA: Diagnosis not present

## 2011-09-12 DIAGNOSIS — J9600 Acute respiratory failure, unspecified whether with hypoxia or hypercapnia: Secondary | ICD-10-CM

## 2011-09-12 DIAGNOSIS — R0989 Other specified symptoms and signs involving the circulatory and respiratory systems: Secondary | ICD-10-CM | POA: Diagnosis not present

## 2011-09-12 DIAGNOSIS — D72829 Elevated white blood cell count, unspecified: Secondary | ICD-10-CM

## 2011-09-12 DIAGNOSIS — J95821 Acute postprocedural respiratory failure: Secondary | ICD-10-CM | POA: Diagnosis not present

## 2011-09-12 DIAGNOSIS — K219 Gastro-esophageal reflux disease without esophagitis: Secondary | ICD-10-CM | POA: Diagnosis present

## 2011-09-12 DIAGNOSIS — R918 Other nonspecific abnormal finding of lung field: Secondary | ICD-10-CM | POA: Diagnosis not present

## 2011-09-12 DIAGNOSIS — J9601 Acute respiratory failure with hypoxia: Secondary | ICD-10-CM | POA: Diagnosis not present

## 2011-09-12 DIAGNOSIS — R739 Hyperglycemia, unspecified: Secondary | ICD-10-CM

## 2011-09-12 DIAGNOSIS — J984 Other disorders of lung: Secondary | ICD-10-CM | POA: Diagnosis present

## 2011-09-12 DIAGNOSIS — J45901 Unspecified asthma with (acute) exacerbation: Secondary | ICD-10-CM | POA: Diagnosis not present

## 2011-09-12 DIAGNOSIS — C541 Malignant neoplasm of endometrium: Secondary | ICD-10-CM | POA: Diagnosis present

## 2011-09-12 DIAGNOSIS — J441 Chronic obstructive pulmonary disease with (acute) exacerbation: Secondary | ICD-10-CM | POA: Diagnosis not present

## 2011-09-12 DIAGNOSIS — I10 Essential (primary) hypertension: Secondary | ICD-10-CM | POA: Diagnosis present

## 2011-09-12 DIAGNOSIS — M1710 Unilateral primary osteoarthritis, unspecified knee: Secondary | ICD-10-CM

## 2011-09-12 DIAGNOSIS — C549 Malignant neoplasm of corpus uteri, unspecified: Principal | ICD-10-CM | POA: Diagnosis present

## 2011-09-12 DIAGNOSIS — I517 Cardiomegaly: Secondary | ICD-10-CM | POA: Diagnosis present

## 2011-09-12 DIAGNOSIS — N950 Postmenopausal bleeding: Secondary | ICD-10-CM

## 2011-09-12 DIAGNOSIS — R0609 Other forms of dyspnea: Secondary | ICD-10-CM | POA: Diagnosis not present

## 2011-09-12 DIAGNOSIS — K449 Diaphragmatic hernia without obstruction or gangrene: Secondary | ICD-10-CM | POA: Diagnosis present

## 2011-09-12 DIAGNOSIS — J4489 Other specified chronic obstructive pulmonary disease: Secondary | ICD-10-CM | POA: Diagnosis present

## 2011-09-12 DIAGNOSIS — E876 Hypokalemia: Secondary | ICD-10-CM | POA: Diagnosis not present

## 2011-09-12 DIAGNOSIS — E662 Morbid (severe) obesity with alveolar hypoventilation: Secondary | ICD-10-CM | POA: Diagnosis present

## 2011-09-12 DIAGNOSIS — J449 Chronic obstructive pulmonary disease, unspecified: Secondary | ICD-10-CM | POA: Diagnosis present

## 2011-09-12 HISTORY — PX: SALPINGOOPHORECTOMY: SHX82

## 2011-09-12 HISTORY — PX: ABDOMINAL HYSTERECTOMY: SHX81

## 2011-09-12 LAB — BASIC METABOLIC PANEL
CO2: 27 mEq/L (ref 19–32)
Calcium: 9.1 mg/dL (ref 8.4–10.5)
Creatinine, Ser: 0.56 mg/dL (ref 0.50–1.10)
GFR calc non Af Amer: >90 mL/min (ref >90)
Glucose, Bld: 154 mg/dL — ABNORMAL HIGH (ref 70–99)
Sodium: 133 mEq/L — ABNORMAL LOW (ref 135–145)

## 2011-09-12 LAB — SAMPLE TO BLOOD BANK

## 2011-09-12 LAB — CBC
MCH: 27.3 pg (ref 26.0–34.0)
MCHC: 33.6 g/dL (ref 30.0–36.0)
MCV: 81 fL (ref 78.0–100.0)
Platelets: 466 10*3/uL — ABNORMAL HIGH (ref 150–400)
RBC: 5.54 MIL/uL — ABNORMAL HIGH (ref 3.87–5.11)

## 2011-09-12 SURGERY — HYSTERECTOMY, ABDOMINAL
Anesthesia: General | Site: Abdomen | Wound class: Clean Contaminated

## 2011-09-12 MED ORDER — LACTATED RINGERS IV SOLN
INTRAVENOUS | Status: DC | PRN
  Administered 2011-09-12 (×2): via INTRAVENOUS

## 2011-09-12 MED ORDER — CEFOXITIN SODIUM-DEXTROSE 1-4 GM-% IV SOLR (PREMIX)
INTRAVENOUS | Status: AC
  Filled 2011-09-12: qty 100

## 2011-09-12 MED ORDER — HYDROMORPHONE HCL PF 1 MG/ML IJ SOLN
INTRAMUSCULAR | Status: AC
  Filled 2011-09-12: qty 1

## 2011-09-12 MED ORDER — HYDRALAZINE HCL 20 MG/ML IJ SOLN
INTRAMUSCULAR | Status: AC
  Filled 2011-09-12: qty 1

## 2011-09-12 MED ORDER — MAGNESIUM HYDROXIDE 400 MG/5ML PO SUSP
30.0000 mL | Freq: Two times a day (BID) | ORAL | Status: DC
  Administered 2011-09-13 (×2): 30 mL via ORAL
  Filled 2011-09-12 (×3): qty 30

## 2011-09-12 MED ORDER — PROPOFOL 10 MG/ML IV EMUL
INTRAVENOUS | Status: DC | PRN
  Administered 2011-09-12: 120 mg via INTRAVENOUS

## 2011-09-12 MED ORDER — NEOSTIGMINE METHYLSULFATE 1 MG/ML IJ SOLN
INTRAMUSCULAR | Status: DC | PRN
  Administered 2011-09-12: 4 mg via INTRAVENOUS

## 2011-09-12 MED ORDER — SODIUM CHLORIDE 0.9 % IJ SOLN: 9.0000 mL | INTRAMUSCULAR | Status: DC | PRN

## 2011-09-12 MED ORDER — OXYCODONE-ACETAMINOPHEN 5-325 MG PO TABS
1.0000 | ORAL_TABLET | ORAL | Status: DC | PRN
  Administered 2011-09-13 (×3): 2 via ORAL
  Administered 2011-09-14: 1 via ORAL
  Administered 2011-09-14 (×3): 2 via ORAL
  Administered 2011-09-14 – 2011-09-15 (×7): 1 via ORAL
  Administered 2011-09-15: 2 via ORAL
  Administered 2011-09-15 – 2011-09-16 (×3): 1 via ORAL
  Administered 2011-09-17: 2 via ORAL
  Filled 2011-09-12 (×5): qty 2
  Filled 2011-09-12 (×2): qty 1
  Filled 2011-09-12 (×2): qty 2
  Filled 2011-09-12 (×5): qty 1
  Filled 2011-09-12: qty 2
  Filled 2011-09-12: qty 1
  Filled 2011-09-12: qty 2
  Filled 2011-09-12: qty 1
  Filled 2011-09-12: qty 2

## 2011-09-12 MED ORDER — ATENOLOL 25 MG PO TABS
25.0000 mg | ORAL_TABLET | Freq: Every day | ORAL | Status: DC
  Administered 2011-09-13 – 2011-09-17 (×5): 25 mg via ORAL
  Filled 2011-09-12 (×7): qty 1

## 2011-09-12 MED ORDER — KCL IN DEXTROSE-NACL 20-5-0.45 MEQ/L-%-% IV SOLN
INTRAVENOUS | Status: DC
  Administered 2011-09-13: via INTRAVENOUS
  Filled 2011-09-12 (×3): qty 1000

## 2011-09-12 MED ORDER — ONDANSETRON HCL 4 MG/2ML IJ SOLN
INTRAMUSCULAR | Status: DC | PRN
  Administered 2011-09-12: 4 mg via INTRAVENOUS

## 2011-09-12 MED ORDER — DIPHENHYDRAMINE HCL 50 MG/ML IJ SOLN: 12.5000 mg | Freq: Four times a day (QID) | INTRAMUSCULAR | Status: DC | PRN

## 2011-09-12 MED ORDER — PROMETHAZINE HCL 25 MG/ML IJ SOLN
6.2500 mg | INTRAMUSCULAR | Status: DC | PRN
  Administered 2011-09-12: 6.25 mg via INTRAVENOUS

## 2011-09-12 MED ORDER — ZOLPIDEM TARTRATE 5 MG PO TABS: 5.0000 mg | ORAL_TABLET | Freq: Every evening | ORAL | Status: DC | PRN

## 2011-09-12 MED ORDER — GLYCOPYRROLATE 0.2 MG/ML IJ SOLN
INTRAMUSCULAR | Status: DC | PRN
  Administered 2011-09-12: .6 mg via INTRAVENOUS

## 2011-09-12 MED ORDER — HYDROMORPHONE HCL PF 1 MG/ML IJ SOLN
0.2500 mg | INTRAMUSCULAR | Status: DC | PRN
  Administered 2011-09-12: 0.25 mg via INTRAVENOUS
  Administered 2011-09-12: 0.5 mg via INTRAVENOUS
  Administered 2011-09-12 (×3): 0.25 mg via INTRAVENOUS

## 2011-09-12 MED ORDER — ACETAMINOPHEN 10 MG/ML IV SOLN
INTRAVENOUS | Status: DC | PRN
  Administered 2011-09-12: 1000 mg via INTRAVENOUS

## 2011-09-12 MED ORDER — SUCCINYLCHOLINE CHLORIDE 20 MG/ML IJ SOLN
INTRAMUSCULAR | Status: DC | PRN
  Administered 2011-09-12: 100 mg via INTRAVENOUS

## 2011-09-12 MED ORDER — HYDROMORPHONE 0.3 MG/ML IV SOLN
INTRAVENOUS | Status: DC
  Administered 2011-09-12: 0.3 mg via INTRAVENOUS
  Administered 2011-09-13: 0.999 mg via INTRAVENOUS
  Administered 2011-09-13: 1.99 mg via INTRAVENOUS
  Filled 2011-09-12: qty 25

## 2011-09-12 MED ORDER — DIPHENHYDRAMINE HCL 12.5 MG/5ML PO ELIX: 12.5000 mg | ORAL_SOLUTION | Freq: Four times a day (QID) | ORAL | Status: DC | PRN

## 2011-09-12 MED ORDER — SCOPOLAMINE 1 MG/3DAYS TD PT72
MEDICATED_PATCH | TRANSDERMAL | Status: AC
  Filled 2011-09-12: qty 1

## 2011-09-12 MED ORDER — ENOXAPARIN SODIUM 40 MG/0.4ML ~~LOC~~ SOLN
40.0000 mg | SUBCUTANEOUS | Status: AC
  Administered 2011-09-12: 40 mg via SUBCUTANEOUS

## 2011-09-12 MED ORDER — PROMETHAZINE HCL 25 MG/ML IJ SOLN
INTRAMUSCULAR | Status: AC
  Filled 2011-09-12: qty 1

## 2011-09-12 MED ORDER — ROCURONIUM BROMIDE 100 MG/10ML IV SOLN
INTRAVENOUS | Status: DC | PRN
  Administered 2011-09-12: 5 mg via INTRAVENOUS
  Administered 2011-09-12: 30 mg via INTRAVENOUS

## 2011-09-12 MED ORDER — DEXTROSE 5 % IV SOLN
2.0000 g | INTRAVENOUS | Status: AC
  Administered 2011-09-12: 2 g via INTRAVENOUS
  Filled 2011-09-12: qty 2

## 2011-09-12 MED ORDER — ACETAMINOPHEN 10 MG/ML IV SOLN
INTRAVENOUS | Status: AC
  Filled 2011-09-12: qty 100

## 2011-09-12 MED ORDER — BUDESONIDE-FORMOTEROL FUMARATE 160-4.5 MCG/ACT IN AERO
2.0000 | INHALATION_SPRAY | Freq: Two times a day (BID) | RESPIRATORY_TRACT | Status: DC
  Administered 2011-09-13 – 2011-09-18 (×10): 2 via RESPIRATORY_TRACT
  Filled 2011-09-12 (×2): qty 6

## 2011-09-12 MED ORDER — FENTANYL CITRATE 0.05 MG/ML IJ SOLN
INTRAMUSCULAR | Status: DC | PRN
  Administered 2011-09-12 (×2): 100 ug via INTRAVENOUS
  Administered 2011-09-12: 50 ug via INTRAVENOUS

## 2011-09-12 MED ORDER — HYDRALAZINE HCL 20 MG/ML IJ SOLN
5.0000 mg | INTRAMUSCULAR | Status: DC | PRN
  Administered 2011-09-12 (×2): 5 mg via INTRAVENOUS

## 2011-09-12 MED ORDER — CLONIDINE HCL 0.3 MG PO TABS
0.3000 mg | ORAL_TABLET | Freq: Every day | ORAL | Status: DC
  Administered 2011-09-13 – 2011-09-17 (×5): 0.3 mg via ORAL
  Filled 2011-09-12 (×7): qty 1

## 2011-09-12 MED ORDER — ONDANSETRON HCL 4 MG/2ML IJ SOLN
4.0000 mg | Freq: Four times a day (QID) | INTRAMUSCULAR | Status: DC | PRN
  Administered 2011-09-13 – 2011-09-14 (×2): 4 mg via INTRAVENOUS
  Filled 2011-09-12 (×2): qty 2

## 2011-09-12 MED ORDER — NALOXONE HCL 0.4 MG/ML IJ SOLN: 0.4000 mg | INTRAMUSCULAR | Status: DC | PRN

## 2011-09-12 MED ORDER — LACTATED RINGERS IV SOLN
Freq: Once | INTRAVENOUS | Status: AC
  Administered 2011-09-12: 1000 mL via INTRAVENOUS

## 2011-09-12 MED ORDER — STERILE WATER FOR IRRIGATION IR SOLN
Status: DC | PRN
  Administered 2011-09-12: 1500 mL

## 2011-09-12 MED ORDER — SCOPOLAMINE 1 MG/3DAYS TD PT72
1.0000 | MEDICATED_PATCH | Freq: Once | TRANSDERMAL | Status: DC
  Administered 2011-09-12: 1.5 mg via TRANSDERMAL
  Filled 2011-09-12: qty 1

## 2011-09-12 MED ORDER — DEXAMETHASONE SODIUM PHOSPHATE 10 MG/ML IJ SOLN
INTRAMUSCULAR | Status: DC | PRN
  Administered 2011-09-12: 10 mg via INTRAVENOUS

## 2011-09-12 SURGICAL SUPPLY — 50 items
APPLIER CLIP 11 MED OPEN (CLIP)
ATTRACTOMAT 16X20 MAGNETIC DRP (DRAPES) ×3 IMPLANT
BAG URINE DRAINAGE (UROLOGICAL SUPPLIES) IMPLANT
BENZOIN TINCTURE PRP APPL 2/3 (GAUZE/BANDAGES/DRESSINGS) IMPLANT
BLADE EXTENDED COATED 6.5IN (ELECTRODE) ×3 IMPLANT
CANISTER SUCTION 2500CC (MISCELLANEOUS) ×3 IMPLANT
CHLORAPREP W/TINT 26ML (MISCELLANEOUS) ×6 IMPLANT
CLIP APPLIE 11 MED OPEN (CLIP) IMPLANT
CLIP TI MEDIUM LARGE 6 (CLIP) ×18 IMPLANT
CLOTH BEACON ORANGE TIMEOUT ST (SAFETY) ×3 IMPLANT
COVER SURGICAL LIGHT HANDLE (MISCELLANEOUS) ×6 IMPLANT
DRAPE UTILITY 15X26 (DRAPE) ×3 IMPLANT
DRAPE WARM FLUID 44X44 (DRAPE) ×3 IMPLANT
DRSG TELFA 4X14 ISLAND ADH (GAUZE/BANDAGES/DRESSINGS) ×3 IMPLANT
ELECT REM PT RETURN 9FT ADLT (ELECTROSURGICAL) ×3
ELECTRODE REM PT RTRN 9FT ADLT (ELECTROSURGICAL) ×2 IMPLANT
GAUZE SPONGE 4X4 16PLY XRAY LF (GAUZE/BANDAGES/DRESSINGS) ×3 IMPLANT
GLOVE BIO SURGEON STRL SZ7.5 (GLOVE) ×9 IMPLANT
GLOVE BIOGEL M STRL SZ7.5 (GLOVE) ×9 IMPLANT
GLOVE INDICATOR 8.0 STRL GRN (GLOVE) ×3 IMPLANT
GOWN STRL REIN XL XLG (GOWN DISPOSABLE) ×3 IMPLANT
LIGASURE IMPACT 36 18CM CVD LR (INSTRUMENTS) ×3 IMPLANT
NS IRRIG 1000ML POUR BTL (IV SOLUTION) ×3 IMPLANT
PACK ABDOMINAL WL (CUSTOM PROCEDURE TRAY) ×3 IMPLANT
SHEET LAVH (DRAPES) ×3 IMPLANT
SPONGE LAP 18X18 X RAY DECT (DISPOSABLE) ×6 IMPLANT
STRIP CLOSURE SKIN 1/2X4 (GAUZE/BANDAGES/DRESSINGS) IMPLANT
SUT ETHILON 1 LR 30 (SUTURE) IMPLANT
SUT PDS AB 0 CT1 36 (SUTURE) ×6 IMPLANT
SUT PDS AB 0 CTX 60 (SUTURE) ×6 IMPLANT
SUT SILK 0 (SUTURE)
SUT SILK 0 30XBRD TIE 6 (SUTURE) IMPLANT
SUT SILK 2 0 (SUTURE)
SUT SILK 2 0 30  PSL (SUTURE)
SUT SILK 2 0 30 PSL (SUTURE) IMPLANT
SUT SILK 2-0 18XBRD TIE 12 (SUTURE) IMPLANT
SUT VIC AB 0 CT1 36 (SUTURE) ×24 IMPLANT
SUT VIC AB 2-0 CT2 27 (SUTURE) IMPLANT
SUT VIC AB 2-0 SH 27 (SUTURE) ×2
SUT VIC AB 2-0 SH 27X BRD (SUTURE) ×4 IMPLANT
SUT VIC AB 3-0 CTX 36 (SUTURE) IMPLANT
SUT VIC AB 3-0 SH 27 (SUTURE) ×2
SUT VIC AB 3-0 SH 27X BRD (SUTURE) ×4 IMPLANT
SUT VIC AB 4-0 PS1 27 (SUTURE) ×6 IMPLANT
SUT VICRYL 0 TIES 12 18 (SUTURE) ×3 IMPLANT
TOWEL BLUE STERILE X RAY DET (MISCELLANEOUS) ×3 IMPLANT
TOWEL OR 17X26 10 PK STRL BLUE (TOWEL DISPOSABLE) ×6 IMPLANT
TOWEL OR NON WOVEN STRL DISP B (DISPOSABLE) ×3 IMPLANT
TRAY FOLEY CATH 14FRSI W/METER (CATHETERS) ×3 IMPLANT
WATER STERILE IRR 1500ML POUR (IV SOLUTION) ×3 IMPLANT

## 2011-09-12 NOTE — Anesthesia Preprocedure Evaluation (Signed)
Anesthesia Evaluation  Patient identified by MRN, date of birth, ID band Patient awake    Reviewed: Allergy & Precautions, H&P , NPO status , Patient's Chart, lab work & pertinent test results, reviewed documented beta blocker date and time   History of Anesthesia Complications (+) PONV and Family history of anesthesia reaction  Airway Mallampati: II TM Distance: >3 FB     Dental  (+) Edentulous Upper, Partial Upper, Caps and Dental Advisory Given,    Pulmonary shortness of breath, with exertion and lying, asthma , COPD COPD inhaler,  clear to auscultation  Pulmonary exam normal       Cardiovascular hypertension, Pt. on home beta blockers +CHF, + Orthopnea and + DOE Regular + Systolic murmurs    Neuro/Psych  Neuromuscular disease    GI/Hepatic Neg liver ROS, hiatal hernia, GERD-  Controlled,  Endo/Other  Morbid obesity  Renal/GU negative Renal ROS     Musculoskeletal negative musculoskeletal ROS (+)   Abdominal Normal abdominal exam  (+) obese,   Peds negative pediatric ROS (+)  Hematology negative hematology ROS (+)   Anesthesia Other Findings   Reproductive/Obstetrics negative OB ROS                           Anesthesia Physical Anesthesia Plan  ASA: III  Anesthesia Plan: General   Post-op Pain Management:    Induction: Intravenous  Airway Management Planned: Oral ETT  Additional Equipment:   Intra-op Plan:   Post-operative Plan:   Informed Consent: I have reviewed the patients History and Physical, chart, labs and discussed the procedure including the risks, benefits and alternatives for the proposed anesthesia with the patient or authorized representative who has indicated his/her understanding and acceptance.   Dental advisory given  Plan Discussed with: CRNA  Anesthesia Plan Comments:         Anesthesia Quick Evaluation

## 2011-09-12 NOTE — Transfer of Care (Signed)
Immediate Anesthesia Transfer of Care Note  Patient: [name]  Procedure(s) Performed:  HYSTERECTOMY ABDOMINAL - Total Abdominal Hysterectomy, Bilateral Salpingo Oophorectomy; SALPINGO OOPHERECTOMY  Patient Location: PACU  Anesthesia Type: General  Level of Consciousness: awake, alert , oriented and patient cooperative  Airway & Oxygen Therapy: Patient Spontanous Breathing and Patient connected to face mask oxygen  Post-op Assessment: Report given to PACU RN  Post vital signs: Reviewed and stable  Complications: No apparent anesthesia complications

## 2011-09-12 NOTE — Progress Notes (Deleted)
Generalized weakness noted

## 2011-09-12 NOTE — Anesthesia Procedure Notes (Signed)
Date/Time: 09/12/2011 5:04 PM Performed by: Delphia Grates

## 2011-09-12 NOTE — Preoperative (Addendum)
Beta Blockers   Reason not to administer Beta Blockers:Not Applicable, Hold  beta blocker due to Bradycardia (HR less than 50 bpm) 

## 2011-09-12 NOTE — Anesthesia Postprocedure Evaluation (Signed)
  Anesthesia Post-op Note  Patient: [name]  Procedure(s) Performed:  HYSTERECTOMY ABDOMINAL - Total Abdominal Hysterectomy, Bilateral Salpingo Oophorectomy; SALPINGO OOPHERECTOMY  Patient Location: PACU  Anesthesia Type: General  Level of Consciousness: awake, alert  and oriented  Airway and Oxygen Therapy: Patient Spontanous Breathing and Patient connected to nasal cannula oxygen  Post-op Pain: mild  Post-op Assessment: Post-op Vital signs reviewed, Patient's Cardiovascular Status Stable and Respiratory Function Stable  Post-op Vital Signs: stable  Complications: No apparent anesthesia complications

## 2011-09-12 NOTE — Op Note (Signed)
Pre-operative Diagnosis:  Grade 2 endometrial cancer      Morbid obesity   Post-operative Diagnosis:  Grade 2 endometrial cancer, morbid obesity   Operation: Total abdominal hysterectomy, bilateral salpingo-oophorectomy  Surgeon: Laurette Schimke M.D. PhD Assistant surgeon: Antionette Char M.D. Assistant:  Telford Nab RN  Procedure Details  The patient was seen in the Holding Room. The risks, benefits, complications, treatment options, and expected outcomes were discussed with the patient.  The patient concurred with the proposed plan, giving informed consent.   The patient was  identified as [name] and the procedure verified as Total abdominal hysterectomy, bilateral salpingo-oophorectomy, lymph node disseaction. A Time Out was held and the above information confirmed.  After induction of anesthesia, the patient was draped and prepped in the usual sterile manner. The patient was placed in the semi-lithotomy position, in Teller stirrups after anesthesia and draped and prepped in the usual sterile manner. A foley catheter was placed.  A vertical   incision was made and carried through the subcutaneous tissue to the fascia. The fascial incision was made and extended vertically . The rectus muscles were separated. The peritoneum was identified and entered. Peritoneal incision was extended longitudinally.  an 8 cm uterus was appreciated normal adnexa. The pelvis was android very deep and with the longest instruments it was difficult to reach the cervix.   A Bookwalter  retractor was placed and bowel was packed away with numerous moistened laparotomy packs.   The round ligaments were identified and cut with the bovie. The anterior peritoneal reflection was incised and the bladder was dissected off the lower uterine segment. The retroperitoneal space was explored and the ureters were identified bilaterally. The right infundibulo-pelvic ligament was grasped and secured and transected  with the LigaSure.  The left infundibulo-pelvic ligament was grasped and secured and transected with a LigaSure clamp.  The uterine vessels were skeletonized, then clamped, cut and ligated with the LigaSure clamp as that was the only instrument long enough to access the pelvis.  Serial pedicles of the cardinal and utero-sacral ligaments were secured with  the LigaSure clamp.  Heaney clamps were placed inferior to the cervix and the specimen amputated . The vaginal cuff was closed in the standard [facility] fashion with 0 Vicryl. Interrupted figure-of-eight sutures with 0 Vicryl were also used to facilitate closure.  The redundant rectosigmoid was noted to have abrasions. The integrity of the serosa and superficial muscularis was preserved the placement of interrupted 3-0 Vicryl sutures. The pelvis was copiously irrigated. Hemostasis was observed.  The depth of the pelvis precluded safe lymph node dissection.  It was determined in the best interest of the patient to terminate the procedure.  Slightly blood tinged urine was appreciated at the closure of the procedure. The bladder had been tediously dissected off the vagina, and ureters were identified in their entirety within the pelvis.   The retractor and all packing were removed from the abdomen. The fascia was approximated with an en masse closure with 0 looped PDS suture.  The subcutaneous layer was irrigated. Hemostasis was observed. The skin was approximated with staples.  Instrument, sponge, and needle counts were correct prior to abdominal closure and at the conclusion of the case.   Findings:  7 cm uterus normal adnexa with no evidence of intraperitoneal metastatic disease.  Estimated Blood Loss:  150cc         Drains: None          Total IV Fluids: 1  L          Specimens:  Uterus cervix ovaries and fallopian tubes          Complications:  None; patient tolerated the procedure well.         Disposition: PACU - hemodynamically stable.          Condition: stable

## 2011-09-13 ENCOUNTER — Inpatient Hospital Stay (HOSPITAL_COMMUNITY): Payer: Medicare Other

## 2011-09-13 DIAGNOSIS — J9601 Acute respiratory failure with hypoxia: Secondary | ICD-10-CM | POA: Diagnosis not present

## 2011-09-13 DIAGNOSIS — J9600 Acute respiratory failure, unspecified whether with hypoxia or hypercapnia: Secondary | ICD-10-CM

## 2011-09-13 DIAGNOSIS — D72829 Elevated white blood cell count, unspecified: Secondary | ICD-10-CM

## 2011-09-13 DIAGNOSIS — J810 Acute pulmonary edema: Secondary | ICD-10-CM

## 2011-09-13 DIAGNOSIS — R739 Hyperglycemia, unspecified: Secondary | ICD-10-CM

## 2011-09-13 DIAGNOSIS — I517 Cardiomegaly: Secondary | ICD-10-CM

## 2011-09-13 DIAGNOSIS — R918 Other nonspecific abnormal finding of lung field: Secondary | ICD-10-CM | POA: Diagnosis not present

## 2011-09-13 LAB — BLOOD GAS, ARTERIAL
Acid-Base Excess: 1.2 mmol/L (ref 0.0–2.0)
Acid-Base Excess: 2.2 mmol/L — ABNORMAL HIGH (ref 0.0–2.0)
Bicarbonate: 28.3 mEq/L — ABNORMAL HIGH (ref 20.0–24.0)
Bicarbonate: 28 mEq/L — ABNORMAL HIGH (ref 20.0–24.0)
Delivery systems: POSITIVE
Drawn by: 275531
Expiratory PAP: 5
FIO2: 0.4 %
Inspiratory PAP: 10
Mode: POSITIVE
O2 Content: 4 L/min
O2 Saturation: 87.9 %
O2 Saturation: 93.6 %
Patient temperature: 98.6
Patient temperature: 98.6
RATE: 10 resp/min
TCO2: 25.1 mmol/L (ref 0–100)
TCO2: 24.4 mmol/L (ref 0–100)
pCO2 arterial: 49.7 mmHg — ABNORMAL HIGH (ref 35.0–45.0)
pH, Arterial: 7.369 (ref 7.350–7.400)
pO2, Arterial: 56.2 mmHg — ABNORMAL LOW (ref 80.0–100.0)
pO2, Arterial: 65.2 mmHg — ABNORMAL LOW (ref 80.0–100.0)

## 2011-09-13 LAB — BASIC METABOLIC PANEL
BUN: 10 mg/dL (ref 6–23)
CO2: 30 mEq/L (ref 19–32)
Calcium: 9.1 mg/dL (ref 8.4–10.5)
Chloride: 95 mEq/L — ABNORMAL LOW (ref 96–112)
Creatinine, Ser: 0.61 mg/dL (ref 0.50–1.10)
GFR calc Af Amer: >90 mL/min (ref >90)
GFR calc non Af Amer: 89 mL/min — ABNORMAL LOW (ref >90)
Glucose, Bld: 149 mg/dL — ABNORMAL HIGH (ref 70–99)
Potassium: 3.7 mEq/L (ref 3.5–5.1)
Sodium: 132 mEq/L — ABNORMAL LOW (ref 135–145)

## 2011-09-13 LAB — CBC
HCT: 46 % (ref 36.0–46.0)
Hemoglobin: 15.7 g/dL — ABNORMAL HIGH (ref 12.0–15.0)
MCH: 27.7 pg (ref 26.0–34.0)
MCHC: 34.1 g/dL (ref 30.0–36.0)

## 2011-09-13 LAB — TROPONIN I: Troponin I: <0.3 ng/mL (ref <0.30)

## 2011-09-13 LAB — PRO B NATRIURETIC PEPTIDE: Pro B Natriuretic peptide (BNP): 628.4 pg/mL — ABNORMAL HIGH (ref 0–125)

## 2011-09-13 LAB — MAGNESIUM: Magnesium: 2 mg/dL (ref 1.5–2.5)

## 2011-09-13 LAB — CK TOTAL AND CKMB (NOT AT ARMC): Total CK: 262 U/L — ABNORMAL HIGH (ref 7–177)

## 2011-09-13 MED ORDER — FUROSEMIDE 10 MG/ML IJ SOLN
40.0000 mg | Freq: Every day | INTRAMUSCULAR | Status: DC
  Administered 2011-09-13 – 2011-09-14 (×2): 40 mg via INTRAVENOUS
  Filled 2011-09-13 (×2): qty 4

## 2011-09-13 MED ORDER — ALBUTEROL SULFATE (5 MG/ML) 0.5% IN NEBU
INHALATION_SOLUTION | RESPIRATORY_TRACT | Status: AC
  Administered 2011-09-13: 5 mg via RESPIRATORY_TRACT
  Filled 2011-09-13: qty 1

## 2011-09-13 MED ORDER — IPRATROPIUM BROMIDE 0.02 % IN SOLN
RESPIRATORY_TRACT | Status: AC
  Administered 2011-09-13: 0.5 mg via RESPIRATORY_TRACT
  Filled 2011-09-13: qty 2.5

## 2011-09-13 MED ORDER — IPRATROPIUM BROMIDE 0.02 % IN SOLN
0.5000 mg | RESPIRATORY_TRACT | Status: DC
  Administered 2011-09-13 – 2011-09-14 (×6): 0.5 mg via RESPIRATORY_TRACT
  Filled 2011-09-13 (×6): qty 2.5

## 2011-09-13 MED ORDER — CLINDAMYCIN PHOSPHATE 600 MG/50ML IV SOLN: 600.0000 mg | Freq: Three times a day (TID) | INTRAVENOUS | Status: DC

## 2011-09-13 MED ORDER — FUROSEMIDE 10 MG/ML IJ SOLN
40.0000 mg | Freq: Once | INTRAMUSCULAR | Status: AC
  Administered 2011-09-13: 40 mg via INTRAVENOUS

## 2011-09-13 MED ORDER — SODIUM CHLORIDE 0.9 % IV SOLN
3.0000 g | Freq: Four times a day (QID) | INTRAVENOUS | Status: DC
  Administered 2011-09-13 – 2011-09-16 (×12): 3 g via INTRAVENOUS
  Filled 2011-09-13 (×21): qty 3

## 2011-09-13 MED ORDER — IPRATROPIUM BROMIDE 0.02 % IN SOLN
0.5000 mg | Freq: Once | RESPIRATORY_TRACT | Status: AC
  Administered 2011-09-13: 0.5 mg via RESPIRATORY_TRACT

## 2011-09-13 MED ORDER — SODIUM CHLORIDE 0.9 % IV SOLN: 1.5000 g | Freq: Four times a day (QID) | INTRAVENOUS | Status: DC

## 2011-09-13 MED ORDER — ALBUTEROL SULFATE (5 MG/ML) 0.5% IN NEBU
INHALATION_SOLUTION | RESPIRATORY_TRACT | Status: AC
  Administered 2011-09-13: 2.5 mg via RESPIRATORY_TRACT
  Filled 2011-09-13: qty 0.5

## 2011-09-13 MED ORDER — HYDROMORPHONE HCL PF 1 MG/ML IJ SOLN
0.2500 mg | INTRAMUSCULAR | Status: DC | PRN
  Administered 2011-09-13: 1 mg via INTRAVENOUS
  Filled 2011-09-13 (×3): qty 1

## 2011-09-13 MED ORDER — ALBUTEROL SULFATE (5 MG/ML) 0.5% IN NEBU
5.0000 mg | INHALATION_SOLUTION | Freq: Once | RESPIRATORY_TRACT | Status: AC
  Administered 2011-09-13: 5 mg via RESPIRATORY_TRACT

## 2011-09-13 MED ORDER — ALBUTEROL SULFATE (5 MG/ML) 0.5% IN NEBU
2.5000 mg | INHALATION_SOLUTION | RESPIRATORY_TRACT | Status: DC
  Administered 2011-09-13 (×4): 2.5 mg via RESPIRATORY_TRACT
  Administered 2011-09-14: 08:00:00 via RESPIRATORY_TRACT
  Administered 2011-09-14 – 2011-09-18 (×21): 2.5 mg via RESPIRATORY_TRACT
  Filled 2011-09-13 (×23): qty 0.5

## 2011-09-13 MED ORDER — MUPIROCIN 2 % EX OINT
1.0000 "application " | TOPICAL_OINTMENT | Freq: Two times a day (BID) | CUTANEOUS | Status: AC
  Administered 2011-09-13 – 2011-09-17 (×10): 1 via NASAL
  Filled 2011-09-13 (×3): qty 22

## 2011-09-13 MED ORDER — VITAMINS A & D EX OINT
TOPICAL_OINTMENT | CUTANEOUS | Status: AC
  Administered 2011-09-13: 5
  Filled 2011-09-13: qty 5

## 2011-09-13 MED ORDER — FUROSEMIDE 10 MG/ML IJ SOLN
INTRAMUSCULAR | Status: AC
  Filled 2011-09-13: qty 4

## 2011-09-13 MED ORDER — ALBUTEROL SULFATE (5 MG/ML) 0.5% IN NEBU
INHALATION_SOLUTION | RESPIRATORY_TRACT | Status: AC
  Filled 2011-09-13: qty 1

## 2011-09-13 MED ORDER — CHLORHEXIDINE GLUCONATE CLOTH 2 % EX PADS
6.0000 | MEDICATED_PAD | Freq: Every day | CUTANEOUS | Status: DC
  Administered 2011-09-13 – 2011-09-17 (×4): 6 via TOPICAL

## 2011-09-13 NOTE — Consult Note (Signed)
Subjective: S: Consult note reviewed, patient placed on BiPAP this morning for acute hypoxic respiratory failure. Currently oxygen saturations in 90s, ABG reveals improving PCO2 at 49 from 57 this morning. Stat chest x-ray obtained this morning reveals pulmonary edema pattern.    Objective: Weight change:   Intake/Output Summary (Last 24 hours) at 09/13/11 0821 Last data filed at 09/13/11 0600  Gross per 24 hour  Intake   1550 ml  Output   1075 ml  Net    475 ml   Blood pressure 129/56, pulse 81, temperature 97.5 F (36.4 C), temperature source Oral, resp. rate 19, height 5\' 4"  (1.626 m), weight 128.8 kg (283 lb 15.2 oz), SpO2 96.00% on BiPAP 10/5   Physical Exam: General: Alert and awake, oriented x3, in mild distress HEENT: anicteric sclera, pupils reactive to light and accommodation, EOMI CVS: S1-S2 clear, no murmur rubs or gallops Chest: Bilateral scattered wheezing with bilateral rales Abdomen: soft, dressing intact   Extremities: no cyanosis, clubbing or edema noted bilaterally Neuro: Cranial nerves II-XII intact, no focal neurological deficits  Lab Results:  Basename 09/12/11 2310  WBC 20.9*  HGB 15.1*  HCT 44.9  PLT 466*   BMET  Basename 09/13/11 0655 09/12/11 2310  NA 132* 133*  K 3.7 3.2*  CL 95* 97  CO2 30 27  GLUCOSE 149* 154*  BUN 10 10  CREATININE 0.61 0.56  CALCIUM 9.1 9.1    Micro Results: Recent Results (from the past 240 hour(s))  SURGICAL PCR SCREEN     Status: Normal   Collection Time   09/08/11 10:50 AM      Component Value Range Status Comment   MRSA, PCR NEGATIVE  NEGATIVE  Final    Staphylococcus aureus NEGATIVE  NEGATIVE  Final     Studies/Results: Dg Chest 2 View  09/08/2011  *RADIOLOGY REPORT*  Clinical Data: Preop endometrial carcinoma  CHEST - 2 VIEW  Comparison: 08/15/2011  Findings: Heart size appears mildly enlarged.  There are low lung volumes and asymmetric elevation of the right hemidiaphragm.  Mild chronic interstitial  coarsening and lingular scarring noted.  Review of the visualized osseous structures is significant for mild spondylosis.  IMPRESSION: No acute findings.  Original Report Authenticated By: Rosealee Albee, M.D.   Dg Chest Portable 1 View  09/13/2011  *RADIOLOGY REPORT*  Clinical Data: Short of breath  PORTABLE CHEST - 1 VIEW  Comparison: 09/08/2011  Findings: Limited by positioning.  Cardiomegaly with central vascular congestion.  Perihilar opacities and linear lung base opacities.  No pneumothorax.  No acute osseous abnormality identified.  IMPRESSION: Cardiomegaly with pulmonary edema pattern.  Original Report Authenticated By: Waneta Martins, M.D.   Dg Chest Portable 1 View  08/15/2011  *RADIOLOGY REPORT*  Clinical Data: Difficulty breathing.  PORTABLE CHEST - 1 VIEW  Comparison: Two-view chest x-ray 01/18/2010.  Findings: The heart is enlarged.  The right hemidiaphragm is elevated.  Moderate pulmonary vascular congestion and mild edema is present.  Small bilateral pleural effusions are suggested.  Mild bibasilar airspace disease is worse on the left.  IMPRESSION:  1.  Cardiomegaly and mild edema, suggesting congestive heart failure. 2.  Mild bibasilar airspace disease is worse on the left, likely reflecting atelectasis. 3.  Suspect small bilateral pleural effusions, particularly on the left.  Original Report Authenticated By: Jamesetta Orleans. MATTERN, M.D.    Medications: Scheduled Meds:   . ipratropium  0.5 mg Nebulization Q4H   And  . albuterol  2.5 mg Nebulization Q4H  .  albuterol  5 mg Nebulization Once  . albuterol      . atenolol  25 mg Oral QHS  . budesonide-formoterol  2 puff Inhalation BID  . cefOXitin  2 g Intravenous To SSTC  . cloNIDine  0.3 mg Oral QHS  . enoxaparin (LOVENOX) injection  40 mg Subcutaneous 120 min pre-op  . furosemide      . furosemide  40 mg Intravenous Once  . furosemide  40 mg Intravenous Daily  . hydrALAZINE      . HYDROmorphone      . HYDROmorphone       . ipratropium  0.5 mg Nebulization Once  . lactated ringers   Intravenous Once  . magnesium hydroxide  30 mL Oral Q12H  . promethazine      . DISCONTD: clindamycin (CLEOCIN) IV  600 mg Intravenous Q8H  . DISCONTD: HYDROmorphone PCA 0.3 mg/mL   Intravenous Q4H  . DISCONTD: scopolamine  1 patch Transdermal Once   Continuous Infusions:   . DISCONTD: dextrose 5 % and 0.45 % NaCl with KCl 20 mEq/L 125 mL/hr at 09/13/11 0010   PRN Meds:.diphenhydrAMINE, HYDROmorphone (DILAUDID) injection, naloxone, ondansetron (ZOFRAN) IV, oxyCODONE-acetaminophen, sodium chloride, DISCONTD: diphenhydrAMINE, DISCONTD: hydrALAZINE, DISCONTD: HYDROmorphone, DISCONTD: promethazine, DISCONTD: sterile water, DISCONTD: zolpidem  Assessment/Plan: Active Problems: 1. Acute hypoxic respiratory failure with respiratory acidosis- likely multifactorial, post op, COPD exacerbation and acute on chronic CHF exacerbation, obesity hypoventilation. patient moved to step down unit today currently on BiPAP, ABG revealed this morning shows improvement in PCO2. Oxygen saturations improving currently in 90's, chest x-ray reveals bilateral pulmonary edema pattern. Patient has a history of COPD however not on any oxygen outpatient. No echocardiogram on chart.  - Discussed with the pulmonary critical care to follow patient as well. Patient's primary pulmonologist is Dr. Kari Baars in [place], Wean BiPAP  as tolerated. - Continue nebulizer treatments, agree with holding off on PCA pump. Will defer to pulmonology for steroids or antibiotics.  Patient is currently afebrile. - Also start her on IV Lasix daily, obtain stat BNP, cardiac enzymes, 2-D echo. She does have a history of CHF but no 2-D echo on the chart for review today. Per patient's family she does not have a cardiologist.   2. Endometrial cancer: Status post total abdominal hysterectomy, management per Dr. Tamela Oddi    LOS: 1 day   Dontrail Blackwell 09/13/2011, 8:21 AM

## 2011-09-13 NOTE — Progress Notes (Signed)
ANTIBIOTIC CONSULT NOTE - INITIAL  Pharmacy Consult for Unasyn  Indication: rule out pneumonia  Allergies  Allergen Reactions  . Carbamazepine Hives and Other (See Comments)    headache    Patient Measurements: Height: 5\' 4"  (162.6 cm) Weight: 283 lb 15.2 oz (128.8 kg) IBW/kg (Calculated) : 54.7    Vital Signs: Temp: 98.7 F (37.1 C) (11/07 1200) Temp src: Oral (11/07 1200) BP: 126/97 mmHg (11/07 0800) Pulse Rate: 77  (11/07 0800) Intake/Output from previous day: 11/06 0701 - 11/07 0700 In: 1550 [I.V.:1550] Out: 1075 [Urine:875; Blood:200] Intake/Output from this shift: Total I/O In: 30 [P.O.:30] Out: 210 [Urine:150; Emesis/NG output:60]  Labs:  [facility] 09/13/11 0655 09/12/11 2310  WBC 17.3* 20.9*  HGB 15.7* 15.1*  PLT 551* 466*  LABCREA -- --  CREATININE 0.61 0.56   Estimated Creatinine Clearance: 85.8 ml/min (by C-G formula based on Cr of 0.61). No results found for this basename: VANCOTROUGH:2,VANCOPEAK:2,VANCORANDOM:2,GENTTROUGH:2,GENTPEAK:2,GENTRANDOM:2,TOBRATROUGH:2,TOBRAPEAK:2,TOBRARND:2,AMIKACINPEAK:2,AMIKACINTROU:2,AMIKACIN:2, in the last 72 hours   Microbiology: Recent Results (from the past 720 hour(s))  SURGICAL PCR SCREEN     Status: Normal   Collection Time   09/08/11 10:50 AM      Component Value Range Status Comment   MRSA, PCR NEGATIVE  NEGATIVE  Final    Staphylococcus aureus NEGATIVE  NEGATIVE  Final   MRSA PCR SCREENING     Status: Abnormal   Collection Time   09/13/11  5:47 AM      Component Value Range Status Comment   MRSA by PCR POSITIVE (*) NEGATIVE  Final     Medical History: Past Medical History  Diagnosis Date  . Arthritis   . Asthma   . Hypertension   . GERD (gastroesophageal reflux disease)   . Obesity, morbid (more than 100 lbs over ideal weight or BMI > 40) 08/2011    Ht 5'7", wt 280 lb  . Bronchitis, chronic obstructive since 2000's  . Shortness of breath     unable to lay flat due to dyspnea  . CHF (congestive  heart failure)     possible CHF on CXR of 08/15/11  . Hiatal hernia   . Cancer     dx with endometrial cancer   . PONV (postoperative nausea and vomiting)     RESP DIFFICULTY  POST OP    Medications:  Scheduled:    . ipratropium  0.5 mg Nebulization Q4H   And  . albuterol  2.5 mg Nebulization Q4H  . albuterol  5 mg Nebulization Once  . ampicillin-sulbactam (UNASYN) IV  1.5 g Intravenous Q6H  . atenolol  25 mg Oral QHS  . budesonide-formoterol  2 puff Inhalation BID  . cefOXitin  2 g Intravenous To SSTC  . Chlorhexidine Gluconate Cloth  6 each Topical Q0600  . cloNIDine  0.3 mg Oral QHS  . furosemide  40 mg Intravenous Once  . furosemide  40 mg Intravenous Daily  . hydrALAZINE      . HYDROmorphone      . HYDROmorphone      . ipratropium  0.5 mg Nebulization Once  . magnesium hydroxide  30 mL Oral Q12H  . mupirocin  1 application Nasal BID  . promethazine      . vitamin A & D      . DISCONTD: clindamycin (CLEOCIN) IV  600 mg Intravenous Q8H  . DISCONTD: furosemide      . DISCONTD: HYDROmorphone PCA 0.3 mg/mL   Intravenous Q4H  . DISCONTD: scopolamine  1 patch Transdermal Once  Infustions:    . DISCONTD: dextrose 5 % and 0.45 % NaCl with KCl 20 mEq/L 125 mL/hr at 09/13/11 0010   Assessment: 70 year old female with endometrial CA. S/p total abdominal hysterectomy, bilateral salpingo-oophorectomy.  Transferred to the ICU on 11/7 for post-op hypoxia with room air sats as low as 85% and CXR that showed diffuse airspace disease.  Unasyn to begin empirically for r/o aspiration PNA.   Goal of Therapy:    Plan:  Unasyn 3gm IV q6h   Maryellen Pile 09/13/2011,3:02 PM

## 2011-09-13 NOTE — Progress Notes (Signed)
1 Day Post-Op Procedure(s) (LRB): HYSTERECTOMY ABDOMINAL (N/A) SALPINGO OOPHERECTOMY (Bilateral)  Subjective: CTSP w/ desaturation on pulse oximetry to 85% on 2 L [place].  Pt w/minimal complaints of SOB.   Objective: Vital signs in last 24 hours: Temp:  [97 F (36.1 C)-98 F (36.7 C)] 97.7 F (36.5 C) (11/07 0140) Pulse Rate:  [58-92] 87  (11/07 0140) Resp:  [11-22] 18  (11/07 0140) BP: (136-210)/(63-175) 136/77 mmHg (11/07 0140) SpO2:  [89 %-97 %] 89 % (11/07 0140) Weight:  [127.007 kg (280 lb)] 280 lb (127.007 kg) (11/06 2223) Last BM Date: 09/12/11  Intake/Output from previous day: 11/06 0701 - 11/07 0700 In: 1550 [I.V.:1550] Out: 600 [Urine:400; Blood:200] Intake/Output this shift: Total I/O In: 550 [I.V.:550] Out: 300 [Urine:300]  Physical Examination: General: Somnolent Resp: wheezes diffuse and diminished air movement  Labs: WBC/Hgb/Hct/Plts:  20.9/15.1/44.9/466 (11/06 2310) BUN/Cr/glu/ALT/AST/amyl/lip:  10/0.56/--/--/--/--/-- (11/06 2310) ABG    Component Value Date/Time   PHART 7.316* 09/13/2011 0425   HCO3 28.3* 09/13/2011 0425   TCO2 25.1 09/13/2011 0425   O2SAT 87.9 09/13/2011 0425   CXR: pulmonary edema/cardiomegaly  Assessment:  70 y.o. s/p Procedure(s): HYSTERECTOMY ABDOMINAL SALPINGO OOPHERECTOMY  Now with acute respiratory distress  CV: CHF--acute decompensation  Pulm:  H/O COPD/worsening symptoms in setting of pulmonary edema  FEN: I approximate O  Plan: Hospitalist consultation Nebulizers O2 supplementation Diuresis--lasix now Transfer to ICU   LOS: 1 day    JACKSON-MOORE,Brennley Curtice A 09/13/2011, 4:47 AM

## 2011-09-13 NOTE — Clinical Documentation Improvement (Signed)
CHF DOCUMENTATION CLARIFICATION QUERY  Please update your documentation within the medical record to reflect your response to this query.                                                                                     09/13/11  Dear [facility],  In a better effort to capture your patient's severity of illness, reflect appropriate length of stay and utilization of resources, a review of the patient medical record has revealed the following indicators the diagnosis of Heart Failure.    Based on your clinical judgment, please clarify and document in a progress note and/or discharge summary the clinical condition associated with the following supporting information:  In responding to this query please exercise your independent judgment.  The fact that a query is asked, does not imply that any particular answer is desired or expected .Documentation on 09/13/11 noted pt has acute CHF. Please clarify type of heart failure. Thank you for your exceptional documentation. Possible Clinical Conditiions? Acute Systolic Congestive Heart Failure Acute Diastolic Congestive Heart Failure Acute Systolic & Diastolic Congestive Heart Failure Acute on Chronic Systolic Congestive Heart Failure Acute on Chronic Diastolic Congestive Heart Failure Acute on Chronic Systolic & Diastolic  Congestive Heart Failure Other Condition________________________________________ Cannot Clinically Determine  Supporting Information Clinical Information:  Risk Factors:History of CHF, Morbid Obesity, Historyof chronic obstructive bronchitisSigns & Symptoms : cardiomegaly,  BNP: 628.4  Echo results: EF:LV EF: 55% - 60%  Radiology: 09/13/11 CXR: Cardiomegaly with central vascular congestion   Treatment & MEDS: O2 therapy, BIPAP,  Symbicort, Lasix HCTZ, Proventil, Atrovent, Apresolin       Reviewed: no additional doc 09/13/11 Thank You,  Sincerely, Andy Gauss  Clinical Documentation  Specialist: Pager  [phone]  Health Information Management 

## 2011-09-13 NOTE — Progress Notes (Addendum)
NURSE [name] MADE NURSE AWARE OF FROTHY SECRETIONS. PATIENT ASSESSED WITH FINDINGS OF MODERATE AMOUNT OF FROTHY/FOAMY SECRETIONS DROOLING FROM THE PATIENTS MOUTH (YELLOW & THICK IN COLOR). LUNG SOUNDS: EXPIRATORY WHEEZING & DIMINISHED IN BASES (BILATERAL). V/S: 137/77, 92, 22, 98.3, & 86% ON 4 Liters/Min via NASAL CANNULA. FOLEY PATENT WITH CLOUDY CONCENTRATED URINE (PINK/AMBER IN COLOR). MD MADE AWARE OF FINDINGS & NEW ORDERS GIVEN: ABG'S, PORTABLE CHEST X-RAY, & VENTI-MASK  (NOW). WILL CONTINUE TO MONITOR THE PATIENT.   [name]  Lawerance Cruel, RN    09/13/2011@0325 

## 2011-09-13 NOTE — Progress Notes (Signed)
  2D Echocardiogram has been performed.  Lille Karim Lynn Trystin Terhune, RDCS 09/13/2011, 10:35 AM

## 2011-09-13 NOTE — Consult Note (Signed)
Patient name: [name] Medical record number: [id number] Date of birth: 12-Aug-1941 Age: 70 y.o. Gender: female PCP: Carylon Perches, MD  Date: 09/13/2011 Reason for Consult:Dyspnea/respiratory failure  Referring Physician: Rai  Patient Description  70 Year old female s/p: Total abdominal hysterectomy, bilateral salpingo-oophorectomy On 11/6 for Grade 2 endometrial cancer. Transferred to the ICU on 11/7 for post-op hypoxia with room air sats as low as 85% and CXR that showed diffuse airspace disease. She was initially seen by IM service. Given increased work of breathing she was given lasix and placed on NIPPV. PCCM was asked to see for acute respiratory failure pm 11/7     Location Start Stop        Culture Date Result  MRSA smear procalcitonin  11/7 11/7>>> positive   Antibiotic Indication Start Stop  unasyn  (possible aspiration) 11/7>>>    GI Prophylaxis DVT Prophylaxis  PPI 11/7 PAS 11/7   Protocols     Consultants       Date Events  11/7 ECHO: The cavity size was normal. Wall thickness was increased in a pattern of mild LVH. Systolic function was normal. The estimated ejection fraction was in the range of 55% to 60%.     HPI:  69 yowf never smoker  s/p: Total abdominal hysterectomy, bilateral salpingo-oophorectomy On 11/6 for Grade 2 endometrial cancer. Transferred to the ICU on 11/7 for post-op hypoxia with room air sats as low as 85% and CXR that showed diffuse airspace disease. She was initially seen by IM service. Given increased work of breathing she was given lasix and placed on NIPPV. PCCM was asked to see for acute respiratory failure.     Past Medical History  Diagnosis Date  . Arthritis   . Asthma   . Hypertension   . GERD (gastroesophageal reflux disease)   . Obesity, morbid (more than 100 lbs over ideal weight or BMI > 40) 08/2011    Ht 5'7", wt 280 lb  . Bronchitis, chronic obstructive since 2000's  . Shortness of breath     unable to lay flat  due to dyspnea  . CHF (congestive heart failure)     possible CHF on CXR of 08/15/11  . Hiatal hernia   . Cancer     dx with endometrial cancer   . PONV (postoperative nausea and vomiting)     RESP DIFFICULTY  POST OP    Past Surgical History  Procedure Date  . Shoulder surgery     right -removal of bone spur  . Rectocele repair   . Hysteroscopy w/d&c 08/15/2011    Procedure: DILATATION AND CURETTAGE (D&C) /HYSTEROSCOPY;  Surgeon: Tilda Burrow, MD;  Location: [facility];  Service: Gynecology;  Laterality: N/A;  With Suction Curette  . Cataract extraction   . Hiatal hernia repair   . Middle ear surgery     Family History  Problem Relation Age of Onset  . Anesthesia problems Neg Hx   . Hypotension Neg Hx   . Malignant hyperthermia Neg Hx   . Pseudochol deficiency Neg Hx     Social History:  reports that she has never smoked. She does not have any smokeless tobacco history on file. She reports that she does not drink alcohol or use illicit drugs.  Allergies:  Allergies  Allergen Reactions  . Carbamazepine Hives and Other (See Comments)    headache    Medications:  Prior to Admission:  Prescriptions prior to admission  Medication Sig Dispense Refill  . atenolol (  TENORMIN) 50 MG tablet Take 25 mg by mouth at bedtime.       . budesonide-formoterol (SYMBICORT) 160-4.5 MCG/ACT inhaler Inhale 2 puffs into the lungs 2 (two) times daily.       . Calcium Carbonate-Vitamin D (CALCIUM 600/VITAMIN D) 600-400 MG-UNIT per tablet Take 1 tablet by mouth daily.       . celecoxib (CELEBREX) 200 MG capsule Take 200 mg by mouth daily. Pt has stopped due to financial reasons.  Pt will start taken in Jan.      . cloNIDine (CATAPRES) 0.3 MG tablet Take 0.3 mg by mouth at bedtime.        Marland Kitchen doxylamine, Sleep, (UNISOM) 25 MG tablet Take 25 mg by mouth at bedtime.        Marland Kitchen esomeprazole (NEXIUM) 40 MG capsule Take 40 mg by mouth 2 (two) times daily. Pt has stopped due to financial.  Will start back in  jan      . furosemide (LASIX) 40 MG tablet Take 40 mg by mouth every morning.       . hydrochlorothiazide 25 MG tablet Take 25 mg by mouth every morning.       . loratadine (CLARITIN) 10 MG tablet Take 10 mg by mouth at bedtime as needed. Allergies.       . meloxicam (MOBIC) 15 MG tablet Take 15 mg by mouth daily.        . simethicone (MYLICON) 125 MG chewable tablet Chew 125 mg by mouth daily.        Marland Kitchen omeprazole (PRILOSEC) 20 MG capsule Take 20 mg by mouth 2 (two) times daily.          Review of Systems  Constitutional:appears flushed, but denies, chills, fatigue . Has expected and typical postoperative abdominal pain HEENT: No headaches, Difficulty swallowing, Tooth/dental problems, or Sore throat,  No sneezing, itching, ear ache, nasal congestion, post nasal drip, no visual complaints CV: No chest pain, Orthopnea, PND, chronic swelling in lower extremities, dizziness, palpitations, syncope.  GI: Chronic heartburn, indigestion,exhibits an expected postoperatived vomiting,  diarrhea, change in bowel habits, loss of appetite, bloody stools.  Resp:positive cough, productive clear sputum.No wheezing. Has had postoperative shortness of breath, has baseline exertional dyspnea and orthopnea Skin: no rash or itching or icterus GU: no dysuria, change in color of urine, no urgency or frequency. No flank pain, no hematuria  MS: No joint pain or swelling. No decreased range of motion  Psych: No change in mood or affect. No depression or anxiety.  Neuro: no difficulty with speech, weakness, numbness, ataxia    Temp:  [97.4 F (36.3 C)-98.4 F (36.9 C)] 98.4 F (36.9 C) (11/07 0800) Pulse Rate:  [58-92] 77  (11/07 0800) Resp:  [11-24] 20  (11/07 0800) BP: (126-210)/(56-175) 126/97 mmHg (11/07 0800) SpO2:  [89 %-100 %] 100 % (11/07 1145) FiO2 (%):  [40 %-50 %] 40 % (11/07 0530) Weight:  [127.007 kg (280 lb)-128.8 kg (283 lb 15.2 oz)] 283 lb 15.2 oz (128.8 kg) (11/07 0545)  Intake/Output Summary  (Last 24 hours) at 09/13/11 1433 Last data filed at 09/13/11 1100  Gross per 24 hour  Intake   1580 ml  Output   1285 ml  Net    295 ml   Physical exam Gen.: This is a 70 year old female patient status post abdominal hysterectomy, currently reporting mild abdominal discomfort which is exacerbated by cough and deep breath. HEENT she normocephalic without jugular venous distention. Her mucous membranes are dry, her posterior  pharynx is dry, with dried bile-appearing particles. There is no jugular venous distention. Pulmonary: No accessory muscle use. No wheeze. Equal, and not currently labored. She does currently have bibasilar rales posteriorly. Cardiac regular rate and rhythm with normal sinus rhythm on telemetry. The abdomen is soft and and acutely tender to palpation, there is no organomegaly. Her postoperative dressing is clean and dry with postop drainage marked. AV:WUJWJ catheter in place with clear yellow urine.neuro alert and oriented without focal deficits. Extremities:demonstrate bilateral lower extremity edema.brisk cap refill warm to palpation. Psych: Appropriate. Not anxious.  LAB RESULT BMET    Component Value Date/Time   NA 132* 09/13/2011 0655   K 3.7 09/13/2011 0655   CL 95* 09/13/2011 0655   CO2 30 09/13/2011 0655   GLUCOSE 149* 09/13/2011 0655   BUN 10 09/13/2011 0655   CREATININE 0.61 09/13/2011 0655   CALCIUM 9.1 09/13/2011 0655   GFRNONAA 89* 09/13/2011 0655   GFRAA >90 09/13/2011 0655   CBC    Component Value Date/Time   WBC 17.3* 09/13/2011 0655   RBC 5.67* 09/13/2011 0655   HGB 15.7* 09/13/2011 0655   HCT 46.0 09/13/2011 0655   PLT 551* 09/13/2011 0655   MCV 81.1 09/13/2011 0655   MCH 27.7 09/13/2011 0655   MCHC 34.1 09/13/2011 0655   RDW 14.8 09/13/2011 0655   LYMPHSABS 2.0 09/08/2011 1105   MONOABS 0.8 09/08/2011 1105   EOSABS 0.3 09/08/2011 1105   BASOSABS 0.1 09/08/2011 1105   ABG    Component Value Date/Time   PHART 7.369 09/13/2011 0754   HCO3 28.0* 09/13/2011 0754     TCO2 24.4 09/13/2011 0754   O2SAT 93.6 09/13/2011 0754   Radiology Chest x-ray: Cardiomegaly, low volume. Bilateral pulmonary infiltrates this is new from in comparison to prior film on 11/2 Assessment and Plan  Active Problems: 1)  Acute respiratory failure in setting of bilateral pulmonary infiltrates on chest x-ray. Her echocardiogram is normal, suspect that this reflects either negative pressure pulmonary edema, or aspiration event. She's had several episodes of vomiting in the intensive care in postoperatively. Plan: -check pro-calcitonin. -Empiric Unasyn for now. -Diuresis tolerated. -Followup chest x-ray in the morning.  2) Chronic lung disease of unclear etiology. She reports she's been seen by Dr. [name] for pulmonary in the past and told she has chronic obstructive pulmonary disease. Reports she's had pulmonary function tests but these are not currently in the Epic system. She did report improvement in her activity tolerance after initiating Symbicort. Do not think that this is an exacerbation of underlying lung disease given the acute findings on her chest x-ray.  Plan: -No change in her current bronchodilator therapy at this point.  3) Endometrial cancer Plan: -per surgical services  4)Leukocytosis, may be typical postoperative response. Can't rule out early SIRS. Plan: -check procalcitonin  5)hyperglycemia -check A1C   BABCOCK,PETE 09/13/2011, 2:33 PM

## 2011-09-13 NOTE — Progress Notes (Signed)
Reason for Consult:low sats Referring Physician: Dr Tamela Oddi  [name] is an 70 y.o. female.  HPI:  Called to see 70yo s/p hysterectomy because of room air sats in mid 36s.  Patient denies SOB or CP or palpitations.  On 02 sats remained low.  We were asked to evaluate.   Past Medical History  Diagnosis Date  . Arthritis   . Asthma   . Hypertension   . GERD (gastroesophageal reflux disease)   . Obesity, morbid (more than 100 lbs over ideal weight or BMI > 40) 08/2011    Ht 5'7", wt 280 lb  . Bronchitis, chronic obstructive since 2000's  . Shortness of breath     unable to lay flat due to dyspnea  . CHF (congestive heart failure)     possible CHF on CXR of 08/15/11  . Hiatal hernia   . Cancer     dx with endometrial cancer   . PONV (postoperative nausea and vomiting)     RESP DIFFICULTY  POST OP    Past Surgical History  Procedure Date  . Shoulder surgery     right -removal of bone spur  . Rectocele repair   . Hysteroscopy w/d&c 08/15/2011    Procedure: DILATATION AND CURETTAGE (D&C) /HYSTEROSCOPY;  Surgeon: Tilda Burrow, MD;  Location: [facility];  Service: Gynecology;  Laterality: N/A;  With Suction Curette  . Cataract extraction   . Hiatal hernia repair   . Middle ear surgery     Family History  Problem Relation Age of Onset  . Anesthesia problems Neg Hx   . Hypotension Neg Hx   . Malignant hyperthermia Neg Hx   . Pseudochol deficiency Neg Hx     Social History:  reports that she has never smoked. She does not have any smokeless tobacco history on file. She reports that she does not drink alcohol or use illicit drugs.  Allergies:  Allergies  Allergen Reactions  . Carbamazepine Hives and Other (See Comments)    headache    Prior to Admission medications   Medication Sig Start Date End Date Taking? Authorizing Provider  atenolol (TENORMIN) 50 MG tablet Take 25 mg by mouth at bedtime.    Yes Historical Provider, MD  budesonide-formoterol (SYMBICORT)  160-4.5 MCG/ACT inhaler Inhale 2 puffs into the lungs 2 (two) times daily.    Yes Historical Provider, MD  Calcium Carbonate-Vitamin D (CALCIUM 600/VITAMIN D) 600-400 MG-UNIT per tablet Take 1 tablet by mouth daily.    Yes Historical Provider, MD  celecoxib (CELEBREX) 200 MG capsule Take 200 mg by mouth daily. Pt has stopped due to financial reasons.  Pt will start taken in Jan.   Yes Historical Provider, MD  cloNIDine (CATAPRES) 0.3 MG tablet Take 0.3 mg by mouth at bedtime.     Yes Historical Provider, MD  doxylamine, Sleep, (UNISOM) 25 MG tablet Take 25 mg by mouth at bedtime.     Yes Historical Provider, MD  esomeprazole (NEXIUM) 40 MG capsule Take 40 mg by mouth 2 (two) times daily. Pt has stopped due to financial.  Will start back in jan   Yes Historical Provider, MD  furosemide (LASIX) 40 MG tablet Take 40 mg by mouth every morning.    Yes Historical Provider, MD  hydrochlorothiazide 25 MG tablet Take 25 mg by mouth every morning.    Yes Historical Provider, MD  loratadine (CLARITIN) 10 MG tablet Take 10 mg by mouth at bedtime as needed. Allergies.    Yes  Historical Provider, MD  meloxicam (MOBIC) 15 MG tablet Take 15 mg by mouth daily.     Yes Historical Provider, MD  simethicone (MYLICON) 125 MG chewable tablet Chew 125 mg by mouth daily.     Yes Historical Provider, MD  omeprazole (PRILOSEC) 20 MG capsule Take 20 mg by mouth 2 (two) times daily.      Historical Provider, MD     @CMEDP @   Results for orders placed during the hospital encounter of 09/12/11 (from the past 48 hour(s))  SAMPLE TO BLOOD BANK     Status: Normal   Collection Time   09/12/11 11:28 AM      Component Value Range Comment   Blood Bank Specimen SAMPLE AVAILABLE FOR TESTING      Sample Expiration 09/15/2011     CBC     Status: Abnormal   Collection Time   09/12/11 11:10 PM      Component Value Range Comment   WBC 20.9 (*) 4.0 - 10.5 (K/uL)    RBC 5.54 (*) 3.87 - 5.11 (MIL/uL)    Hemoglobin 15.1 (*) 12.0 - 15.0  (g/dL)    HCT 16.1  09.6 - 04.5 (%)    MCV 81.0  78.0 - 100.0 (fL)    MCH 27.3  26.0 - 34.0 (pg)    MCHC 33.6  30.0 - 36.0 (g/dL)    RDW 40.9  81.1 - 91.4 (%)    Platelets 466 (*) 150 - 400 (K/uL)   BASIC METABOLIC PANEL     Status: Abnormal   Collection Time   09/12/11 11:10 PM      Component Value Range Comment   Sodium 133 (*) 135 - 145 (mEq/L)    Potassium 3.2 (*) 3.5 - 5.1 (mEq/L)    Chloride 97  96 - 112 (mEq/L)    CO2 27  19 - 32 (mEq/L)    Glucose, Bld 154 (*) 70 - 99 (mg/dL)    BUN 10  6 - 23 (mg/dL)    Creatinine, Ser 7.82  0.50 - 1.10 (mg/dL)    Calcium 9.1  8.4 - 10.5 (mg/dL)    GFR calc non Af Amer >90  >90 (mL/min)    GFR calc Af Amer >90  >90 (mL/min)   BLOOD GAS, ARTERIAL     Status: Abnormal   Collection Time   09/13/11  4:25 AM      Component Value Range Comment   O2 Content 4.0      Delivery systems NASAL CANNULA      pH, Arterial 7.316 (*) 7.350 - 7.400     pCO2 arterial 57.1 (*) 35.0 - 45.0 (mmHg)    pO2, Arterial 56.2 (*) 80.0 - 100.0 (mmHg)    Bicarbonate 28.3 (*) 20.0 - 24.0 (mEq/L)    TCO2 25.1  0 - 100 (mmol/L)    Acid-Base Excess 1.2  0.0 - 2.0 (mmol/L)    O2 Saturation 87.9      Patient temperature 98.6      Collection site RIGHT RADIAL      Drawn by [id number]      Sample type ARTERIAL DRAW      Allens test (pass/fail) PASS  PASS      Dg Chest Portable 1 View  09/13/2011  *RADIOLOGY REPORT*  Clinical Data: Short of breath  PORTABLE CHEST - 1 VIEW  Comparison: 09/08/2011  Findings: Limited by positioning.  Cardiomegaly with central vascular congestion.  Perihilar opacities and linear lung base opacities.  No pneumothorax.  No acute osseous abnormality identified.  IMPRESSION: Cardiomegaly with pulmonary edema pattern.  Original Report Authenticated By: Waneta Martins, M.D.    @ROS @ Blood pressure 136/77, pulse 87, temperature 97.7 F (36.5 C), temperature source Oral, resp. rate 24, height 5\' 4"  (1.626 m), weight 127.007 kg (280 lb), SpO2  90.00%. @PHYSEXAMBYAGE2 @  Assessment/Plan: 1. Mild respiratory Failure...Marland KitchenMarland KitchenRespiratory Acidosis with hypoxia-  The patient will be moved to the step down unit where we will place her on Bipap to bring her CO2 down while we treat her with Lasix and Nebs.  She does have some rales and wheezes.  She is cooperative and appropriate and does not seem to be over sedated, but we will hold her PCA pump for now.  I see that she does have a history of low sats post anesthesia.  2.  Endometrial cancer s/p hysterectomy- management per Dr Tamela Oddi  Thank you for the consult.  [facility] will follow.  Lydiah Pong 09/13/2011, 5:30 AM

## 2011-09-13 NOTE — Progress Notes (Signed)
Patient placed on BiPAP @ 0615 per MD. Settings=10/5 and 60%. Patient oxygen saturation 92%. Tolerating well.

## 2011-09-13 NOTE — Plan of Care (Signed)
Problem: Phase II Progression Outcomes Goal: Pain controlled Outcome: Progressing Medication PO  Controlling surgical pain

## 2011-09-13 NOTE — Progress Notes (Signed)
0810 0.5 dilaudid iv for post-op hysterectomy pain "7/10". Nauseated afterward, fan provided, and comfort measures. 0818 Echocardiogram at bedside. 0900 Labs reported to Dr Isidoro Donning [facility] elev, troponin neg. 0930 Percocet 2 tabs po given for pain. 1200 Sleeping well after percocet admin. Placed on isolation for +MRSA screen. 1730 CHG Bath given. 1900 Report to PM RN, remains stable on 4L n/c. 450 uop after lasix this shift.

## 2011-09-14 ENCOUNTER — Inpatient Hospital Stay (HOSPITAL_COMMUNITY): Payer: Medicare Other

## 2011-09-14 LAB — BASIC METABOLIC PANEL
BUN: 16 mg/dL (ref 6–23)
CO2: 34 mEq/L — ABNORMAL HIGH (ref 19–32)
Calcium: 9 mg/dL (ref 8.4–10.5)
Chloride: 99 mEq/L (ref 96–112)
Creatinine, Ser: 0.76 mg/dL (ref 0.50–1.10)
GFR calc Af Amer: >90 mL/min (ref >90)
GFR calc non Af Amer: 83 mL/min — ABNORMAL LOW (ref >90)
Glucose, Bld: 112 mg/dL — ABNORMAL HIGH (ref 70–99)
Potassium: 3.4 mEq/L — ABNORMAL LOW (ref 3.5–5.1)
Sodium: 138 mEq/L (ref 135–145)

## 2011-09-14 LAB — CBC
HCT: 41.2 % (ref 36.0–46.0)
MCV: 81.7 fL (ref 78.0–100.0)
RBC: 5.04 MIL/uL (ref 3.87–5.11)
WBC: 16.8 10*3/uL — ABNORMAL HIGH (ref 4.0–10.5)

## 2011-09-14 LAB — PRO B NATRIURETIC PEPTIDE: Pro B Natriuretic peptide (BNP): 424.3 pg/mL — ABNORMAL HIGH (ref 0–125)

## 2011-09-14 LAB — MAGNESIUM: Magnesium: 2.5 mg/dL (ref 1.5–2.5)

## 2011-09-14 MED ORDER — FUROSEMIDE 40 MG PO TABS
40.0000 mg | ORAL_TABLET | Freq: Every day | ORAL | Status: DC
  Administered 2011-09-15 – 2011-09-18 (×4): 40 mg via ORAL
  Filled 2011-09-14 (×4): qty 1

## 2011-09-14 MED ORDER — POTASSIUM CHLORIDE CRYS ER 20 MEQ PO TBCR
40.0000 meq | EXTENDED_RELEASE_TABLET | Freq: Once | ORAL | Status: AC
  Administered 2011-09-14: 40 meq via ORAL
  Filled 2011-09-14: qty 2

## 2011-09-14 MED ORDER — ENOXAPARIN SODIUM 40 MG/0.4ML ~~LOC~~ SOLN
40.0000 mg | SUBCUTANEOUS | Status: DC
  Administered 2011-09-14 – 2011-09-17 (×4): 40 mg via SUBCUTANEOUS
  Filled 2011-09-14 (×5): qty 0.4

## 2011-09-14 MED ORDER — PANTOPRAZOLE SODIUM 40 MG PO TBEC
40.0000 mg | DELAYED_RELEASE_TABLET | Freq: Every day | ORAL | Status: DC
  Administered 2011-09-14 – 2011-09-15 (×2): 40 mg via ORAL
  Filled 2011-09-14 (×3): qty 1

## 2011-09-14 MED ORDER — ALBUTEROL SULFATE (5 MG/ML) 0.5% IN NEBU
INHALATION_SOLUTION | RESPIRATORY_TRACT | Status: AC
  Filled 2011-09-14: qty 0.5

## 2011-09-14 MED ORDER — PANTOPRAZOLE SODIUM 20 MG PO TBEC
20.0000 mg | DELAYED_RELEASE_TABLET | Freq: Every day | ORAL | Status: DC
  Administered 2011-09-14: 20 mg via ORAL
  Filled 2011-09-14 (×2): qty 1

## 2011-09-14 MED ORDER — POLYETHYLENE GLYCOL 3350 17 G PO PACK
17.0000 g | PACK | Freq: Every day | ORAL | Status: DC | PRN
  Administered 2011-09-17: 17 g via ORAL
  Filled 2011-09-14 (×2): qty 1

## 2011-09-14 MED ORDER — HYDROCHLOROTHIAZIDE 25 MG PO TABS
25.0000 mg | ORAL_TABLET | Freq: Every day | ORAL | Status: DC
  Administered 2011-09-14 – 2011-09-18 (×5): 25 mg via ORAL
  Filled 2011-09-14 (×5): qty 1

## 2011-09-14 MED ORDER — ENOXAPARIN (LOVENOX) PATIENT EDUCATION KIT
PACK | Freq: Once | Status: DC
  Filled 2011-09-14: qty 1

## 2011-09-14 NOTE — Progress Notes (Signed)
  2 Days Post-Op Procedure(s) (LRB): HYSTERECTOMY ABDOMINAL (N/A) SALPINGO OOPHERECTOMY (Bilateral)  Subjective: Patient reports gas discomfort Objective: Vital signs in last 24 hours: Temp:  [98 F (36.7 C)-98.7 F (37.1 C)] 98 F (36.7 C) (11/08 0400) Pulse Rate:  [72-81] 81  (11/08 0500) Resp:  [16-20] 20  (11/08 0500) BP: (107-151)/(48-77) 117/61 mmHg (11/08 0500) SpO2:  [95 %-100 %] 98 % (11/08 0750) FiO2 (%):  [0 %-3 %] 0 % (11/08 0400) Last BM Date: 09/12/11  Intake/Output from previous day: 11/07 0701 - 11/08 0700 In: 480 [P.O.:180; IV Piggyback:300] Out: 835 [Urine:775; Emesis/NG output:60] Intake/Output this shift:    Physical Examination: GI: soft, non-tender; bowel sounds normal; no masses,  no organomegaly and incision: clean, dry, intact and bloody drainage present Extremities: extremities normal, atraumatic, no cyanosis or edema  Labs: WBC/Hgb/Hct/Plts:  16.8/13.9/41.2/551 (11/08 4098) BUN/Cr/glu/ALT/AST/amyl/lip:  16/0.76/--/--/--/--/-- (11/08 1191)   Assessment:  70 y.o. s/p Procedure(s): HYSTERECTOMY ABDOMINAL SALPINGO OOPHERECTOMY: stable  Pain:  Pain is well-controlled on PCA. ID: Pneumonia (aspiration) ? CV:  CHF--above notes appreciated GI:  Tolerating po: Yes    Prophylaxis: intermittent pneumatic compression boots and Will start lovenox.  Plan: OK for transfer to a telemetry bed D/C PCA D/C Foley Continue antibiotics Encourage ambulation Continue bronchodilators  LOS: 2 days    JACKSON-MOORE,Kynli Chou A 09/14/2011, 10:14 AM

## 2011-09-14 NOTE — Progress Notes (Signed)
0800 VSS, no complaints. Abdominal incision intact. Family attentive at bedside. 1000 Sat up in chair with assistance x 1 per RN. Ate breakfast. MD removed post-op dressing, staples ota. 1130 Ambulated to nurse's station, gait slow and slightly unsteady, standby assistance x 1. Escorted to beach mural on 3rd floor and outside by RN and family. 1200 Percocet po given for pain, sat up in chair for lunch. 1400 Dr Isidoro Donning paged for PPI.  1500 Report given to pm RN

## 2011-09-14 NOTE — Progress Notes (Signed)
Subjective: S: Patient significantly improved today, shortness of breath improved currently oxygen saturation 98% on 3 L oxygen via nasal cannula. No BM since hospitalization. Patient started on regular diet today by primary attending.  Objective: Weight change:   Intake/Output Summary (Last 24 hours) at 09/14/11 0938 Last data filed at 09/14/11 0600  Gross per 24 hour  Intake    480 ml  Output    835 ml  Net   -355 ml   Blood pressure 117/61, pulse 81, temperature 98 F (36.7 C), temperature source Oral, resp. rate 20, height 5\' 4"  (1.626 m), weight 128.8 kg (283 lb 15.2 oz), SpO2 98.00%. On 3 L oxygen via nasal cannula  Physical Exam: General: Alert and awake, oriented x3, in no distress HEENT: anicteric sclera, pupils reactive to light and accommodation, EOMI CVS: S1-S2 clear, no murmur rubs or gallops Chest: Decreased breath sounds at the bases otherwise fairly clear Abdomen: soft, dressing intact   Extremities: no cyanosis, clubbing or edema noted bilaterally Neuro: Cranial nerves II-XII intact, no focal neurological deficits  Lab Results:  [facility] 09/14/11 0657 09/13/11 0655  WBC 16.8* 17.3*  HGB 13.9 15.7*  HCT 41.2 46.0  PLT 551* 551*   BMET  Basename 09/14/11 0657 09/13/11 0655  NA 138 132*  K 3.4* 3.7  CL 99 95*  CO2 34* 30  GLUCOSE 112* 149*  BUN 16 10  CREATININE 0.76 0.61  CALCIUM 9.0 9.1    Micro Results: Recent Results (from the past 240 hour(s))  SURGICAL PCR SCREEN     Status: Normal   Collection Time   09/08/11 10:50 AM      Component Value Range Status Comment   MRSA, PCR NEGATIVE  NEGATIVE  Final    Staphylococcus aureus NEGATIVE  NEGATIVE  Final   MRSA PCR SCREENING     Status: Abnormal   Collection Time   09/13/11  5:47 AM      Component Value Range Status Comment   MRSA by PCR POSITIVE (*) NEGATIVE  Final     Studies/Results: Dg Chest 2 View  09/08/2011  *RADIOLOGY REPORT*  Clinical Data: Preop endometrial carcinoma  CHEST - 2 VIEW   Comparison: 08/15/2011  Findings: Heart size appears mildly enlarged.  There are low lung volumes and asymmetric elevation of the right hemidiaphragm.  Mild chronic interstitial coarsening and lingular scarring noted.  Review of the visualized osseous structures is significant for mild spondylosis.  IMPRESSION: No acute findings.  Original Report Authenticated By: Rosealee Albee, M.D.   Dg Chest Portable 1 View  09/13/2011  *RADIOLOGY REPORT*  Clinical Data: Short of breath  PORTABLE CHEST - 1 VIEW  Comparison: 09/08/2011  Findings: Limited by positioning.  Cardiomegaly with central vascular congestion.  Perihilar opacities and linear lung base opacities.  No pneumothorax.  No acute osseous abnormality identified.  IMPRESSION: Cardiomegaly with pulmonary edema pattern.  Original Report Authenticated By: Waneta Martins, M.D.   Dg Chest Portable 1 View  08/15/2011  *RADIOLOGY REPORT*  Clinical Data: Difficulty breathing.  PORTABLE CHEST - 1 VIEW  Comparison: Two-view chest x-ray 01/18/2010.  Findings: The heart is enlarged.  The right hemidiaphragm is elevated.  Moderate pulmonary vascular congestion and mild edema is present.  Small bilateral pleural effusions are suggested.  Mild bibasilar airspace disease is worse on the left.  IMPRESSION:  1.  Cardiomegaly and mild edema, suggesting congestive heart failure. 2.  Mild bibasilar airspace disease is worse on the left, likely reflecting atelectasis. 3.  Suspect small bilateral pleural effusions, particularly on the left.  Original Report Authenticated By: Jamesetta Orleans. MATTERN, M.D.    Medications: Scheduled Meds:    . ipratropium  0.5 mg Nebulization Q4H   And  . albuterol  2.5 mg Nebulization Q4H  . ampicillin-sulbactam (UNASYN) IV  3 g Intravenous Q6H  . atenolol  25 mg Oral QHS  . budesonide-formoterol  2 puff Inhalation BID  . Chlorhexidine Gluconate Cloth  6 each Topical Q0600  . cloNIDine  0.3 mg Oral QHS  . furosemide  40 mg  Intravenous Daily  . magnesium hydroxide  30 mL Oral Q12H  . mupirocin  1 application Nasal BID  . potassium chloride  40 mEq Oral Once  . DISCONTD: ampicillin-sulbactam (UNASYN) IV  1.5 g Intravenous Q6H   Continuous Infusions:  PRN Meds:.diphenhydrAMINE, HYDROmorphone (DILAUDID) injection, naloxone, ondansetron (ZOFRAN) IV, oxyCODONE-acetaminophen, polyethylene glycol, sodium chloride  2-D echocardiogram 09/13/2011 : Left ventricle: The cavity size was normal. Wall thickness was increased in a pattern of mild LVH. Systolic function was normal. The estimated ejection fraction was in the range of 55% to 60%.    Assessment/Plan: Active Problems: 1. Acute hypoxic respiratory failure with respiratory acidosis- likely multifactorial, post op aspiration event secondary to nausea vomiting, COPD exacerbation, mild flash pulmonary edema, obesity hypoventilation.  -  Oxygen saturations improving currently in 90's, on 3 L oxygen via nasal cannula. 2-D echocardiogram done shows preserved EF of 55-60%. Will change Lasix to by mouth daily. - Pulmonary critical care (Dr. Sherene Sires) following, recommendations appreciated. Patient to followup with her primary pulmonologist Dr. Kari Baars in [place] upon discharge.  - Continue nebulizer treatments, continue Unasyn for now, white count trending down.  Patient is currently afebrile.  2. Hypokalemia replaced  3.. Endometrial cancer: Status post total abdominal hysterectomy, management per Dr. Tamela Oddi. If tolerating diet patient can be transferred to telemetry monitored floor to start physical therapy and rehabilitation. We'll continue to follow today.    LOS: 2 days   Joseangel Nettleton 09/14/2011, 9:38 AM

## 2011-09-15 MED ORDER — PANTOPRAZOLE SODIUM 40 MG PO TBEC
40.0000 mg | DELAYED_RELEASE_TABLET | Freq: Two times a day (BID) | ORAL | Status: DC
  Administered 2011-09-15 – 2011-09-18 (×7): 40 mg via ORAL
  Filled 2011-09-15 (×6): qty 1

## 2011-09-15 NOTE — Progress Notes (Signed)
  Vs nl Abdomen soft good bowel sounds   Incision clean and dry

## 2011-09-15 NOTE — Progress Notes (Signed)
Subjective: S: Shortness of breath improved, oxygen saturation in 90s on 2 L oxygen via nasal cannula. Patient complaining of pain in the IV site and the incision site.   Objective: Weight change:   Intake/Output Summary (Last 24 hours) at 09/15/11 0958 Last data filed at 09/15/11 0700  Gross per 24 hour  Intake   1020 ml  Output    895 ml  Net    125 ml   Blood pressure 117/61, pulse 81, temperature 98 F (36.7 C), temperature source Oral, resp. rate 20, height 5\' 4"  (1.626 m), weight 128.8 kg (283 lb 15.2 oz), SpO2 98.00%. On 3 L oxygen via nasal cannula  Physical Exam: General: Alert and awake, oriented x3, in no distress HEENT: anicteric sclera, pupils reactive to light and accommodation, EOMI CVS: S1-S2 clear, no murmur rubs or gallops Chest: Fairly clear, no wheezing rales or rhonchi Abdomen: soft, incisions clean extremities: no cyanosis, clubbing or edema noted bilaterally Neuro: Cranial nerves II-XII intact, no focal neurological deficits  Lab Results:  [facility] 09/14/11 0657 09/13/11 0655  WBC 16.8* 17.3*  HGB 13.9 15.7*  HCT 41.2 46.0  PLT 551* 551*   BMET  Basename 09/14/11 0657 09/13/11 0655  NA 138 132*  K 3.4* 3.7  CL 99 95*  CO2 34* 30  GLUCOSE 112* 149*  BUN 16 10  CREATININE 0.76 0.61  CALCIUM 9.0 9.1    Micro Results: Recent Results (from the past 240 hour(s))  SURGICAL PCR SCREEN     Status: Normal   Collection Time   09/08/11 10:50 AM      Component Value Range Status Comment   MRSA, PCR NEGATIVE  NEGATIVE  Final    Staphylococcus aureus NEGATIVE  NEGATIVE  Final   MRSA PCR SCREENING     Status: Abnormal   Collection Time   09/13/11  5:47 AM      Component Value Range Status Comment   MRSA by PCR POSITIVE (*) NEGATIVE  Final     Studies/Results: Dg Chest 2 View  09/08/2011  *RADIOLOGY REPORT*  Clinical Data: Preop endometrial carcinoma  CHEST - 2 VIEW  Comparison: 08/15/2011  Findings: Heart size appears mildly enlarged.  There are  low lung volumes and asymmetric elevation of the right hemidiaphragm.  Mild chronic interstitial coarsening and lingular scarring noted.  Review of the visualized osseous structures is significant for mild spondylosis.  IMPRESSION: No acute findings.  Original Report Authenticated By: Rosealee Albee, M.D.   Dg Chest Portable 1 View  09/13/2011  *RADIOLOGY REPORT*  Clinical Data: Short of breath  PORTABLE CHEST - 1 VIEW  Comparison: 09/08/2011  Findings: Limited by positioning.  Cardiomegaly with central vascular congestion.  Perihilar opacities and linear lung base opacities.  No pneumothorax.  No acute osseous abnormality identified.  IMPRESSION: Cardiomegaly with pulmonary edema pattern.  Original Report Authenticated By: Waneta Martins, M.D.   Dg Chest Portable 1 View  08/15/2011  *RADIOLOGY REPORT*  Clinical Data: Difficulty breathing.  PORTABLE CHEST - 1 VIEW  Comparison: Two-view chest x-ray 01/18/2010.  Findings: The heart is enlarged.  The right hemidiaphragm is elevated.  Moderate pulmonary vascular congestion and mild edema is present.  Small bilateral pleural effusions are suggested.  Mild bibasilar airspace disease is worse on the left.  IMPRESSION:  1.  Cardiomegaly and mild edema, suggesting congestive heart failure. 2.  Mild bibasilar airspace disease is worse on the left, likely reflecting atelectasis. 3.  Suspect small bilateral pleural effusions, particularly on the  left.  Original Report Authenticated By: Jamesetta Orleans. MATTERN, M.D.    Medications: Scheduled Meds:    . albuterol  2.5 mg Nebulization Q4H  . ampicillin-sulbactam (UNASYN) IV  3 g Intravenous Q6H  . atenolol  25 mg Oral QHS  . budesonide-formoterol  2 puff Inhalation BID  . Chlorhexidine Gluconate Cloth  6 each Topical Q0600  . cloNIDine  0.3 mg Oral QHS  . enoxaparin (LOVENOX) injection  40 mg Subcutaneous Q24H  . enoxaparin   Does not apply Once  . furosemide  40 mg Oral Daily  . hydrochlorothiazide  25  mg Oral Daily  . mupirocin  1 application Nasal BID  . pantoprazole  20 mg Oral Q1200  . pantoprazole  40 mg Oral Q0600  . potassium chloride  40 mEq Oral Once  . DISCONTD: ipratropium  0.5 mg Nebulization Q4H  . DISCONTD: magnesium hydroxide  30 mL Oral Q12H   Continuous Infusions:  PRN Meds:.oxyCODONE-acetaminophen, polyethylene glycol, DISCONTD: diphenhydrAMINE, DISCONTD:  HYDROmorphone (DILAUDID) injection, DISCONTD: naloxone, DISCONTD: ondansetron (ZOFRAN) IV, DISCONTD: sodium chloride  2-D echocardiogram 09/13/2011 : Left ventricle: The cavity size was normal. Wall thickness was increased in a pattern of mild LVH. Systolic function was normal. The estimated ejection fraction was in the range of 55% to 60%.    Assessment/Plan: Active Problems: 1. Acute hypoxic respiratory failure likely multifactorial, post op aspiration event secondary to nausea vomiting, mild COPD exacerbation, mild flash pulmonary edema post op, obesity hypoventilation: IMPROVED  -  Oxygen saturations Improving currently in 90's, on 2 L oxygen via nasal cannula. 2-D echocardiogram done shows preserved EF of 55-60%. Continue by mouth Lasix at 40 mg daily which is patient's home dose. -Continue nebulizers oxygen by nasal cannula, Symbicort and antibiotics. Patient to followup with her primary pulmonologist Dr. Kari Baars in [place] upon discharge.  - continue Unasyn for now, white count trending down, can change the antibiotic to Avelox 400 mg by mouth daily for a week after discharge.   2. Endometrial cancer: Status post total abdominal hysterectomy, management per Dr. Tamela Oddi. Patient now tolerating diet. Continue PT OT.  I will sign off , Please call for any questions. At discharge, patient can be continued on all her outpatient medications plus Avelox 400 mg daily for a week after discharge. She should followup with Dr. Kari Baars in [place] her pulmonologist after discharge.   LOS: 3 days     Jaliya Siegmann MD [facility] 09/15/2011, 9:58 AM

## 2011-09-16 LAB — BASIC METABOLIC PANEL
BUN: 15 mg/dL (ref 6–23)
CO2: 37 mEq/L — ABNORMAL HIGH (ref 19–32)
Calcium: 9.4 mg/dL (ref 8.4–10.5)
Chloride: 95 mEq/L — ABNORMAL LOW (ref 96–112)
Creatinine, Ser: 0.77 mg/dL (ref 0.50–1.10)

## 2011-09-16 LAB — CBC
HCT: 41.4 % (ref 36.0–46.0)
MCH: 26.8 pg (ref 26.0–34.0)
MCV: 84 fL (ref 78.0–100.0)
RBC: 4.93 MIL/uL (ref 3.87–5.11)
RDW: 15.4 % (ref 11.5–15.5)
WBC: 9.8 10*3/uL (ref 4.0–10.5)

## 2011-09-16 MED ORDER — BUDESONIDE-FORMOTEROL FUMARATE 160-4.5 MCG/ACT IN AERO: 2.0000 | INHALATION_SPRAY | Freq: Two times a day (BID) | RESPIRATORY_TRACT | Status: DC

## 2011-09-16 MED ORDER — ALBUTEROL SULFATE (5 MG/ML) 0.5% IN NEBU: 2.5000 mg | INHALATION_SOLUTION | RESPIRATORY_TRACT | Status: DC

## 2011-09-16 MED ORDER — ATENOLOL 25 MG PO TABS: 25.0000 mg | ORAL_TABLET | Freq: Every day | ORAL | Status: DC

## 2011-09-16 MED ORDER — CLONIDINE HCL 0.3 MG PO TABS: 0.3000 mg | ORAL_TABLET | Freq: Every day | ORAL | Status: DC

## 2011-09-16 NOTE — Discharge Summary (Signed)
  Pt had tahbso for endometrial cancer post op pulmonary edema Now resolved home on lovenox and percocet  See gyn onc in 6 wks

## 2011-09-16 NOTE — Progress Notes (Signed)
V/s checked prior to d/c home, O2 sats 83 @RA  rechecked O2 sats 86 RA, placed on O2 @3L  Sats went up to 90. I told the pt and the family that Im not comfortable sending her home and will call Dr. Gaynell Face.Dr. Gaynell Face paged , Dr. Mikey Bussing called back and statated she will contact Dr. Kerry Fort (oncology fellow that works with Dr. Nelly Rout who did the pts surgery.  Spoke with Dr. Kerry Fort  and she stated that she is agreeable not to send the patient home and will call Dr. Mikey Bussing to cancel d/c home order since she has no access in [facility] and left her contact #. Dr. Mikey Bussing called back  And made me aware that she will be putting in hold d/c home orders and Dr. Gaynell Face will be in tomorrow morning to see patient. Family updated of above.

## 2011-09-16 NOTE — Plan of Care (Signed)
Problem: Phase III Progression Outcomes Goal: Remove staples if indicated/incision care Outcome: Not Applicable Date Met:  09/16/11 Patient d/c home , will be removed in Md's office   Problem: Discharge Progression Outcomes Goal: Discontinue staples (if applicable) Outcome: Not Applicable Date Met:  09/16/11 Will be d/c'd  In MD's office.

## 2011-09-17 ENCOUNTER — Encounter (HOSPITAL_COMMUNITY): Payer: Self-pay | Admitting: Gynecologic Oncology

## 2011-09-17 MED ORDER — AMPICILLIN 250 MG PO CAPS
250.0000 mg | ORAL_CAPSULE | Freq: Four times a day (QID) | ORAL | Status: DC
  Administered 2011-09-17 – 2011-09-18 (×6): 250 mg via ORAL
  Filled 2011-09-17 (×9): qty 1

## 2011-09-17 NOTE — Progress Notes (Signed)
  Postop day 5 2 vital signs normal Discharged him yesterday because patient unable to maintain adequate sats Today it says he feels the same She has no bowel sounds Legs negative Will not discharge today

## 2011-09-17 NOTE — Progress Notes (Signed)
Pt. Ambulated in hall. Tolerated fair. Oxygen saturation 83% on room air. Applied oxygen when returned to room.

## 2011-09-18 MED ORDER — MOXIFLOXACIN HCL 400 MG PO TABS: 400.0000 mg | ORAL_TABLET | Freq: Every day | ORAL | Status: AC

## 2011-09-18 NOTE — Discharge Planning (Signed)
Physician Discharge Summary  Patient ID: [name] MRN: [id number] DOB/AGE: [birth year] 71 y.o.  Admit date: 09/12/2011 Discharge date: 09/18/2011  Admission Diagnoses: Grade 2 Endometrial cancer  Discharge Diagnoses:  Active Problems:  Chronic lung disease  Endometrial cancer  Acute respiratory failure  Bilateral pulmonary infiltrates on chest x-ray  Leukocytosis  Hyperglycemia   Discharged Condition: stable  Hospital Course: The patient was admitted and underwent a TAH/BSO.  On POD#1 she went into acute respiratory failure and was transferred to the ICU. [facility] was consulted.  The acute, hypoxic respiratory failure was felt to be likely multifactorial: post op, COPD exacerbation and acute on chronic CHF exacerbation, obesity hypoventilation. There was also a question of an aspiration pneumonia.  She was placed on BiPAP.  Serial ABGs revealed improvement in PCO2. Oxygen saturations improved as well into the 90's.   Chest x-rays showed a bilateral pulmonary edema pattern.  Antibiotics were started.  Nebulizer treatments were continued.  Intravenous Lasix was given daily.  A BNP was obtained as well as cardiac enzymes and a 2-D echo.  The echo was read as LVH with a normal ejection fraction.  She was transferred to a telemetry floor.   Her diet was advanced.  There was episode of hypoxia on 11/10.  The oxygen supplementation was resumed and then weaned.  O2 sats on the day of discharge were in the 90s on room air.                                Consults: pulmonary/intensive care  Significant Diagnostic Studies: Echocardiogram  Treatments: antibiotics: Unasyn, cardiac meds: furosemide, respiratory therapy: O2 and albuterol/atropine nebulizer and surgery: TAH/BSO  Discharge Exam: Blood pressure 144/83, pulse 78, temperature 98.5 F (36.9 C), temperature source Oral, resp. rate 18, height 5\' 4"  (1.626 m), weight 124.5 kg (274 lb 7.6 oz), SpO2  90.00%. General: Alert Abdomen: I:C/D/I  Disposition: Home or Self Care  Discharge Orders    Future Appointments: Provider: Department: Dept Phone: Center:   10/25/2011 3:30 PM Rejeana Brock A. Duard Brady, MD [facility] [phone] None     Future Orders Please Complete By Expires   Diet - low sodium heart healthy      Increase activity slowly        Current Discharge Medication List    START taking these medications   Details  albuterol (PROVENTIL) (5 MG/ML) 0.5% nebulizer solution Take 0.5 mLs (2.5 mg total) by nebulization every 4 (four) hours. Qty: 20 mL      CONTINUE these medications which have CHANGED   Details  !! atenolol (TENORMIN) 25 MG tablet Take 1 tablet (25 mg total) by mouth at bedtime.    !! budesonide-formoterol (SYMBICORT) 160-4.5 MCG/ACT inhaler Inhale 2 puffs into the lungs 2 (two) times daily. Qty: 1 Inhaler    !! cloNIDine (CATAPRES) 0.3 MG tablet Take 1 tablet (0.3 mg total) by mouth at bedtime.     !! - Potential duplicate medications found. Please discuss with provider.    CONTINUE these medications which have NOT CHANGED   Details  !! atenolol (TENORMIN) 50 MG tablet Take 25 mg by mouth at bedtime.     !! budesonide-formoterol (SYMBICORT) 160-4.5 MCG/ACT inhaler Inhale 2 puffs into the lungs 2 (two) times daily.     Calcium Carbonate-Vitamin D (CALCIUM 600/VITAMIN D) 600-400 MG-UNIT per tablet Take 1 tablet by mouth daily.     celecoxib (CELEBREX) 200  MG capsule Take 200 mg by mouth daily. Pt has stopped due to financial reasons.  Pt will start taken in Jan.    !! cloNIDine (CATAPRES) 0.3 MG tablet Take 0.3 mg by mouth at bedtime.      doxylamine, Sleep, (UNISOM) 25 MG tablet Take 25 mg by mouth at bedtime.      esomeprazole (NEXIUM) 40 MG capsule Take 40 mg by mouth 2 (two) times daily. Pt has stopped due to financial.  Will start back in jan    furosemide (LASIX) 40 MG tablet Take 40 mg by mouth every morning.     hydrochlorothiazide 25 MG  tablet Take 25 mg by mouth every morning.     loratadine (CLARITIN) 10 MG tablet Take 10 mg by mouth at bedtime as needed. Allergies.     meloxicam (MOBIC) 15 MG tablet Take 15 mg by mouth daily.      simethicone (MYLICON) 125 MG chewable tablet Chew 125 mg by mouth daily.      omeprazole (PRILOSEC) 20 MG capsule Take 20 mg by mouth 2 (two) times daily.       !! - Potential duplicate medications found. Please discuss with provider.     F/U with Deatra Robinson RN in 2 days for staple removal  Signed: JACKSON-MOORE,Caliber Landess A 09/18/2011, 2:47 PM

## 2011-09-18 NOTE — Progress Notes (Signed)
6 Days Post-Op Procedure(s) (LRB): HYSTERECTOMY ABDOMINAL (N/A) SALPINGO OOPHERECTOMY (Bilateral)  Subjective: Patient reports no complaints  Objective: Vital signs in last 24 hours: Temp:  [97.4 F (36.3 C)-98.8 F (37.1 C)] 97.4 F (36.3 C) (11/12 0626) Pulse Rate:  [76-98] 98  (11/12 0626) Resp:  [16-20] 16  (11/12 0626) BP: (132-167)/(70-87) 132/70 mmHg (11/12 0626) SpO2:  [90 %-97 %] 90 % (11/12 0924) FiO2 (%):  [28 %] 28 % (11/12 0443) Weight:  [124.5 kg (274 lb 7.6 oz)] 274 lb 7.6 oz (124.5 kg) (11/12 0530) Last BM Date: 09/18/11  Intake/Output from previous day: 11/11 0701 - 11/12 0700 In: 240 [P.O.:240] Out: 1000 [Urine:1000] Intake/Output this shift: Total I/O In: 180 [P.O.:180] Out: -   Physical Examination: GI: soft, non-tender; bowel sounds normal; no masses,  no organomegaly and incision: clean, dry and intact  Labs:       Assessment:  70 y.o. s/p Procedure(s): HYSTERECTOMY ABDOMINAL SALPINGO OOPHERECTOMY: stable Pulmonary:  H/O COPD.  Stable.  Prophylaxis: pharmacologic prophylaxis (with any of the following: Lovenox).  Plan: Continue to wean O2    LOS: 6 days    JACKSON-MOORE,Edwin Baines A 09/18/2011, 1:30 PM

## 2011-10-24 ENCOUNTER — Encounter: Payer: Self-pay | Admitting: Gynecologic Oncology

## 2011-10-25 ENCOUNTER — Ambulatory Visit: Payer: Medicare Other | Attending: Gynecologic Oncology | Admitting: Gynecologic Oncology

## 2011-10-25 ENCOUNTER — Encounter: Payer: Self-pay | Admitting: Gynecologic Oncology

## 2011-10-25 VITALS — BP 128/72 | HR 84 | Temp 98.5°F | Resp 18 | Ht 64.0 in | Wt 267.0 lb

## 2011-10-25 DIAGNOSIS — I509 Heart failure, unspecified: Secondary | ICD-10-CM | POA: Insufficient documentation

## 2011-10-25 DIAGNOSIS — C549 Malignant neoplasm of corpus uteri, unspecified: Secondary | ICD-10-CM | POA: Insufficient documentation

## 2011-10-25 DIAGNOSIS — R0602 Shortness of breath: Secondary | ICD-10-CM | POA: Insufficient documentation

## 2011-10-25 DIAGNOSIS — K219 Gastro-esophageal reflux disease without esophagitis: Secondary | ICD-10-CM | POA: Insufficient documentation

## 2011-10-25 DIAGNOSIS — J45909 Unspecified asthma, uncomplicated: Secondary | ICD-10-CM | POA: Insufficient documentation

## 2011-10-25 DIAGNOSIS — Z9079 Acquired absence of other genital organ(s): Secondary | ICD-10-CM | POA: Insufficient documentation

## 2011-10-25 DIAGNOSIS — C541 Malignant neoplasm of endometrium: Secondary | ICD-10-CM

## 2011-10-25 DIAGNOSIS — I10 Essential (primary) hypertension: Secondary | ICD-10-CM | POA: Insufficient documentation

## 2011-10-25 DIAGNOSIS — K449 Diaphragmatic hernia without obstruction or gangrene: Secondary | ICD-10-CM | POA: Insufficient documentation

## 2011-10-25 NOTE — Progress Notes (Signed)
Consult Note: Gyn-Onc  [name] 70 y.o. female  CC:  Chief Complaint  Patient presents with  . Endometrial Cancer    Follow up    HPI: HISTORY OF PRESENT ILLNESS: This is a 70 year old gravida 5, para 5, last normal menstrual period in her 9s. She was started on Celebrex  approximately 1 year ago. Approximately 7 to 8 months later, she noted some light vaginal spotting, the bleeding increased, and was associated  with an endometrial biopsy early in 2012 that was negative. Given persistent spotting, a D and C and hysteroscopy was performed on August 15, 2011, that was consistent with a FIGO grade 2 endometrial carcinoma.  She underwent a total abdominal hysterectomy BSO on November 6 year 2012. Pathology revealed a grade 1 endometrioid adenocarcinoma that measured 3.2 cm. The tumor invaded into 0.4 out of 1.5 cm of myometrial thickness for approximately 25%. There was no lymphovascular space involvement. The adnexa were negative. She comes in today for her postoperative check.  Based on GOG criteria she does not require any adjuvant therapy. She is very pleased by this.   Interval History: She has done very well postoperatively. She has occasional burning and itching on the anterior abdominal wall. She occasionally feels some muscle spasm on the right side that is fleeting. The symptoms do not wake her up at night. She is taking a stool softener every other day. She denies any bleeding has had no return of bowel and bladder function otherwise. As stated she has returned to work.  Review of Systems  Current Meds:  Outpatient Encounter Prescriptions as of 10/25/2011  Medication Sig Dispense Refill  . albuterol (PROVENTIL) (5 MG/ML) 0.5% nebulizer solution Take 0.5 mLs (2.5 mg total) by nebulization every 4 (four) hours.  20 mL    . atenolol (TENORMIN) 50 MG tablet Take 25 mg by mouth at bedtime.       . budesonide-formoterol (SYMBICORT) 160-4.5 MCG/ACT inhaler Inhale 2 puffs into  the lungs 2 (two) times daily.       . Calcium Carbonate-Vitamin D (CALCIUM 600/VITAMIN D) 600-400 MG-UNIT per tablet Take 1 tablet by mouth daily.       . celecoxib (CELEBREX) 200 MG capsule Take 200 mg by mouth daily. Pt has stopped due to financial reasons.  Pt will start taken in Jan.      . cloNIDine (CATAPRES) 0.3 MG tablet Take 0.3 mg by mouth at bedtime.        Marland Kitchen doxylamine, Sleep, (UNISOM) 25 MG tablet Take 25 mg by mouth at bedtime.        Marland Kitchen esomeprazole (NEXIUM) 40 MG capsule Take 40 mg by mouth 2 (two) times daily. Pt has stopped due to financial.  Will start back in jan      . furosemide (LASIX) 40 MG tablet Take 40 mg by mouth every morning.       . hydrochlorothiazide 25 MG tablet Take 25 mg by mouth every morning.       . loratadine (CLARITIN) 10 MG tablet Take 10 mg by mouth at bedtime as needed. Allergies.       . meloxicam (MOBIC) 15 MG tablet Take 15 mg by mouth daily.        Marland Kitchen omeprazole (PRILOSEC) 20 MG capsule Take 20 mg by mouth 2 (two) times daily.        . simethicone (MYLICON) 125 MG chewable tablet Chew 125 mg by mouth daily.        Marland Kitchen  atenolol (TENORMIN) 25 MG tablet Take 1 tablet (25 mg total) by mouth at bedtime.      . budesonide-formoterol (SYMBICORT) 160-4.5 MCG/ACT inhaler Inhale 2 puffs into the lungs 2 (two) times daily.  1 Inhaler    . cloNIDine (CATAPRES) 0.3 MG tablet Take 1 tablet (0.3 mg total) by mouth at bedtime.        Allergy:  Allergies  Allergen Reactions  . Carbamazepine Hives and Other (See Comments)    headache    Social Hx:   History   Social History  . Marital Status: Married    Spouse Name: N/A    Number of Children: N/A  . Years of Education: N/A   Occupational History  . Not on file.   Social History Main Topics  . Smoking status: Never Smoker   . Smokeless tobacco: Not on file  . Alcohol Use: No  . Drug Use: No  . Sexually Active: Yes    Birth Control/ Protection: Post-menopausal   Other Topics Concern  . Not on file    Social History Narrative  . No narrative on file    Past Surgical Hx:  Past Surgical History  Procedure Date  . Shoulder surgery     right -removal of bone spur  . Rectocele repair   . Hysteroscopy w/d&c 08/15/2011    Procedure: DILATATION AND CURETTAGE (D&C) /HYSTEROSCOPY;  Surgeon: Tilda Burrow, MD;  Location: [facility];  Service: Gynecology;  Laterality: N/A;  With Suction Curette  . Cataract extraction   . Hiatal hernia repair   . Middle ear surgery   . Abdominal hysterectomy 09/12/2011    Procedure: HYSTERECTOMY ABDOMINAL;  Surgeon: Laurette Schimke, MD;  Location: [facility];  Service: Gynecology;  Laterality: N/A;  Total Abdominal Hysterectomy, Bilateral Salpingo Oophorectomy  . Salpingoophorectomy 09/12/2011    Procedure: SALPINGO OOPHERECTOMY;  Surgeon: Laurette Schimke, MD;  Location: [facility];  Service: Gynecology;  Laterality: Bilateral;    Past Medical Hx:  Past Medical History  Diagnosis Date  . Arthritis   . Asthma   . Hypertension   . GERD (gastroesophageal reflux disease)   . Obesity, morbid (more than 100 lbs over ideal weight or BMI > 40) 08/2011    Ht 5'7", wt 280 lb  . Bronchitis, chronic obstructive since 2000's  . Shortness of breath     unable to lay flat due to dyspnea  . CHF (congestive heart failure)     possible CHF on CXR of 08/15/11  . Hiatal hernia   . Cancer     dx with endometrial cancer   . PONV (postoperative nausea and vomiting)     RESP DIFFICULTY  POST OP    Family Hx:  Family History  Problem Relation Age of Onset  . Anesthesia problems Neg Hx   . Hypotension Neg Hx   . Malignant hyperthermia Neg Hx   . Pseudochol deficiency Neg Hx     Vitals:  Blood pressure 128/72, pulse 84, temperature 98.5 F (36.9 C), temperature source Oral, resp. rate 18, height 5\' 4"  (1.626 m), weight 267 lb (121.11 kg).  Physical Exam: Well-nourished well-developed female in no acute distress.  Abdomen: Well-healed surgical midline incision. There is no  evidence of an incisional hernia. Abdomen is soft and nontender.  Pelvic: Normal external female genitalia. The vagina is atrophic. The vaginal cuff is difficult to visualize due to habitus. There is no evidence of any bleeding. The vaginal cuff is visualized is well-healed. Bimanual examination reveals no  vaginal cuff tenderness masses.  Assessment/Plan: 70 year old with a stage IA grade 1 endometrioid adenocarcinoma history well postoperatively. Based on GOG criteria she does not require any additional therapy. She will return to see our service in 6 months and I will begin alternating visits with Dr. Emelda Fear. She was congratulated on her weight loss. She's lost 12 pounds since her first visit with [place]. She has been working with a nutritionist and will continue to do so.  Cleda Mccreedy A., MD 10/25/2011, 4:32 PM

## 2011-10-25 NOTE — Patient Instructions (Signed)
Return to clinic in 6 months.

## 2011-11-14 ENCOUNTER — Other Ambulatory Visit (HOSPITAL_COMMUNITY): Payer: Self-pay | Admitting: Internal Medicine

## 2011-11-14 ENCOUNTER — Ambulatory Visit (HOSPITAL_COMMUNITY)
Admission: RE | Admit: 2011-11-14 | Discharge: 2011-11-14 | Disposition: A | Discharge status: Home / Self Care | Payer: Medicare Other | Source: Ambulatory Visit | Attending: Internal Medicine | Admitting: Internal Medicine

## 2011-11-14 DIAGNOSIS — R05 Cough: Secondary | ICD-10-CM

## 2011-11-14 DIAGNOSIS — R059 Cough, unspecified: Secondary | ICD-10-CM | POA: Insufficient documentation

## 2011-11-14 DIAGNOSIS — R0989 Other specified symptoms and signs involving the circulatory and respiratory systems: Secondary | ICD-10-CM | POA: Insufficient documentation

## 2012-04-17 ENCOUNTER — Ambulatory Visit: Payer: Medicare Other | Attending: Gynecologic Oncology | Admitting: Gynecologic Oncology

## 2012-04-17 ENCOUNTER — Encounter: Payer: Self-pay | Admitting: Gynecologic Oncology

## 2012-04-17 VITALS — BP 156/80 | HR 72 | Temp 98.2°F | Resp 20 | Ht 64.33 in | Wt 271.9 lb

## 2012-04-17 DIAGNOSIS — C541 Malignant neoplasm of endometrium: Secondary | ICD-10-CM

## 2012-04-17 DIAGNOSIS — K219 Gastro-esophageal reflux disease without esophagitis: Secondary | ICD-10-CM | POA: Insufficient documentation

## 2012-04-17 DIAGNOSIS — Z79899 Other long term (current) drug therapy: Secondary | ICD-10-CM | POA: Insufficient documentation

## 2012-04-17 DIAGNOSIS — Z9079 Acquired absence of other genital organ(s): Secondary | ICD-10-CM | POA: Insufficient documentation

## 2012-04-17 DIAGNOSIS — Z90710 Acquired absence of both cervix and uterus: Secondary | ICD-10-CM | POA: Insufficient documentation

## 2012-04-17 DIAGNOSIS — I10 Essential (primary) hypertension: Secondary | ICD-10-CM | POA: Insufficient documentation

## 2012-04-17 DIAGNOSIS — J45909 Unspecified asthma, uncomplicated: Secondary | ICD-10-CM | POA: Insufficient documentation

## 2012-04-17 DIAGNOSIS — C549 Malignant neoplasm of corpus uteri, unspecified: Secondary | ICD-10-CM | POA: Insufficient documentation

## 2012-04-17 NOTE — Progress Notes (Signed)
Office Visit: Gyn-Onc  Jess Barters 71 y.o. female  CC:  Chief Complaint  Patient presents with  . Endometrial cancer    follow up    HPI: HISTORY OF PRESENT ILLNESS: This is a 71 year old gravida 5, para 5, last normal menstrual period in her 89s. She was started on Celebrex  approximately 1 year ago. Approximately 7 to 8 months later, she noted some light vaginal spotting, the bleeding increased, and was associated  with an endometrial biopsy early in 2012 that was negative. Given persistent spotting, a D and C and hysteroscopy was performed on August 15, 2011, that was consistent with a FIGO grade 2 endometrial carcinoma.  She underwent a total abdominal hysterectomy BSO on November 6 year 2012. Pathology revealed a grade 1 endometrioid adenocarcinoma that measured 3.2 cm. The tumor invaded into 0.4 out of 1.5 cm of myometrial thickness for approximately 25%. There was no lymphovascular space involvement. The adnexa were negative. She comes in today for her postoperative check.  Based on GOG criteria she did not require any adjuvant therapy.   Interval History: Patient reports a change in bowel habits since surgery.  She reports frequent episodes of intermittent diarrhea.  Denies weight loss, or hematochezia.  Last colonoscopy about 10 years ago.   Current Meds:  Outpatient Encounter Prescriptions as of 04/17/2012  Medication Sig Dispense Refill  . albuterol (PROVENTIL) (5 MG/ML) 0.5% nebulizer solution Take 0.5 mLs (2.5 mg total) by nebulization every 4 (four) hours.  20 mL    . atenolol (TENORMIN) 25 MG tablet Take 1 tablet (25 mg total) by mouth at bedtime.      Marland Kitchen atenolol (TENORMIN) 50 MG tablet Take 25 mg by mouth at bedtime.       . budesonide-formoterol (SYMBICORT) 160-4.5 MCG/ACT inhaler Inhale 2 puffs into the lungs 2 (two) times daily.       . budesonide-formoterol (SYMBICORT) 160-4.5 MCG/ACT inhaler Inhale 2 puffs into the lungs 2 (two) times daily.  1 Inhaler    .  Calcium Carbonate-Vitamin D (CALCIUM 600/VITAMIN D) 600-400 MG-UNIT per tablet Take 1 tablet by mouth daily.       . celecoxib (CELEBREX) 200 MG capsule Take 200 mg by mouth daily. Pt has stopped due to financial reasons.  Pt will start taken in Jan.      . cloNIDine (CATAPRES) 0.3 MG tablet Take 0.3 mg by mouth at bedtime.        . cloNIDine (CATAPRES) 0.3 MG tablet Take 1 tablet (0.3 mg total) by mouth at bedtime.      Marland Kitchen doxylamine, Sleep, (UNISOM) 25 MG tablet Take 25 mg by mouth at bedtime.        Marland Kitchen esomeprazole (NEXIUM) 40 MG capsule Take 40 mg by mouth 2 (two) times daily. Pt has stopped due to financial.  Will start back in jan      . furosemide (LASIX) 40 MG tablet Take 40 mg by mouth every morning.       . hydrochlorothiazide 25 MG tablet Take 25 mg by mouth every morning.       . loratadine (CLARITIN) 10 MG tablet Take 10 mg by mouth at bedtime as needed. Allergies.       . meloxicam (MOBIC) 15 MG tablet Take 15 mg by mouth daily.        Marland Kitchen omeprazole (PRILOSEC) 20 MG capsule Take 20 mg by mouth 2 (two) times daily.        . simethicone (MYLICON) 125 MG  chewable tablet Chew 125 mg by mouth daily.          Allergy:  Allergies  Allergen Reactions  . Carbamazepine Hives and Other (See Comments)    headache    Social Hx:   History   Social History  . Marital Status: Married    Spouse Name: N/A    Number of Children: N/A  . Years of Education: N/A   Occupational History  . Not on file.   Social History Main Topics  . Smoking status: Never Smoker   . Smokeless tobacco: Not on file  . Alcohol Use: No  . Drug Use: No  . Sexually Active: Yes    Birth Control/ Protection: Post-menopausal   Other Topics Concern  . Not on file   Social History Narrative  . No narrative on file    Past Surgical Hx:  Past Surgical History  Procedure Date  . Shoulder surgery     right -removal of bone spur  . Rectocele repair   . Hysteroscopy w/d&c 08/15/2011    Procedure: DILATATION  AND CURETTAGE (D&C) /HYSTEROSCOPY;  Surgeon: Tilda Burrow, MD;  Location: [facility];  Service: Gynecology;  Laterality: N/A;  With Suction Curette  . Cataract extraction   . Hiatal hernia repair   . Middle ear surgery   . Abdominal hysterectomy 09/12/2011    Procedure: HYSTERECTOMY ABDOMINAL;  Surgeon: Laurette Schimke, MD;  Location: [facility];  Service: Gynecology;  Laterality: N/A;  Total Abdominal Hysterectomy, Bilateral Salpingo Oophorectomy  . Salpingoophorectomy 09/12/2011    Procedure: SALPINGO OOPHERECTOMY;  Surgeon: Laurette Schimke, MD;  Location: [facility];  Service: Gynecology;  Laterality: Bilateral;    Past Medical Hx:  Past Medical History  Diagnosis Date  . Arthritis   . Asthma   . Hypertension   . GERD (gastroesophageal reflux disease)   . Obesity, morbid (more than 100 lbs over ideal weight or BMI > 40) 08/2011    Ht 5'7", wt 280 lb  . Bronchitis, chronic obstructive since 2000's  . Shortness of breath     unable to lay flat due to dyspnea  . CHF (congestive heart failure)     possible CHF on CXR of 08/15/11  . Hiatal hernia   . Cancer     dx with endometrial cancer   . PONV (postoperative nausea and vomiting)     RESP DIFFICULTY  POST OP    Family Hx:  Family History  Problem Relation Age of Onset  . Anesthesia problems Neg Hx   . Hypotension Neg Hx   . Malignant hyperthermia Neg Hx   . Pseudochol deficiency Neg Hx    Review of Systems Constitutional  Feels well  Cardiovascular  No chest pain, shortness of breath. Reports worsening edema of the lower extremities Pulmonary  No cough or wheeze.  Gastro Intestinal  No nausea, vomiting, or diarrhea. No bright red blood per rectum, no abdominal pain, reports frequent intermittent diarrhea.  Genito Urinary  No frequency, urgency, dysuria, no vaginal bleeding or discharge  Musculo Skeletal  No myalgia, arthralgia, joint swelling or pain  Neurologic  No weakness, change in gait Psychology  No depression, anxiety,  insomnia.   Vitals:  Blood pressure 156/80, pulse 72, temperature 98.2 F (36.8 C), temperature source Oral, resp. rate 20, height 5' 4.33" (1.634 m), weight 271 lb 14.4 oz (123.333 kg).  Physical Exam: Well-nourished well-developed female in no acute distress. Chest: CTA Cardiac:  RRR 3/6 holosystolic murmer Back:  No CVAT LN  survey:  No cervical supra clavicular or inguinal adenopathy Abdomen: Well-healed surgical midline incision. There is no evidence of an incisional hernia. Abdomen is soft and nontender. Pelvic: Normal external female genitalia. The vagina is atrophic. The vaginal cuff is intact.  No vaginal bleeding or discharge.  No nodularity or cul de sac masses.  Rectal:  Good tone, no masses EXT:  3+ edema bilaterally with cyanosis.  Assessment/Plan: 71 year old with a stage IA grade 1 endometrioid adenocarcinoma history well postoperatively. Based on GOG criteria she does not require any additional therapy. She will  begin alternating visits with Dr. Emelda Fear and will see her in 6 months.  She will return to see our service in 12 months. Annual pap by Dr. Emelda Fear. Patient counseled on the signs and symptoms of recurrent disease.  Patient advised to f/u with GI for evaluation of the complaint of intermittent diarrhea   Laurette Schimke, MD 04/17/2012, 4:12 PM

## 2012-04-17 NOTE — Patient Instructions (Addendum)
Begin alternating visits with Dr. Emelda Fear and will see her in 6 months.  She will return to see our service in 12 months. Annual pap by Dr. Emelda Fear. Advised to f/u with GI for evaluation of the complaint of intermittent diarrhea

## 2012-04-18 ENCOUNTER — Ambulatory Visit: Payer: Medicare Other | Admitting: Gynecologic Oncology

## 2012-04-23 ENCOUNTER — Encounter (INDEPENDENT_AMBULATORY_CARE_PROVIDER_SITE_OTHER): Payer: Self-pay | Admitting: *Deleted

## 2012-05-01 LAB — PULMONARY FUNCTION TEST

## 2012-05-29 ENCOUNTER — Telehealth (INDEPENDENT_AMBULATORY_CARE_PROVIDER_SITE_OTHER): Payer: Self-pay | Admitting: *Deleted

## 2012-05-29 ENCOUNTER — Encounter (INDEPENDENT_AMBULATORY_CARE_PROVIDER_SITE_OTHER): Payer: Self-pay | Admitting: *Deleted

## 2012-05-29 ENCOUNTER — Other Ambulatory Visit (INDEPENDENT_AMBULATORY_CARE_PROVIDER_SITE_OTHER): Payer: Self-pay | Admitting: *Deleted

## 2012-05-29 DIAGNOSIS — Z1211 Encounter for screening for malignant neoplasm of colon: Secondary | ICD-10-CM

## 2012-05-29 NOTE — Telephone Encounter (Signed)
Patient needs movi prep 

## 2012-05-30 MED ORDER — PEG-KCL-NACL-NASULF-NA ASC-C 100 G PO SOLR: 1.0000 | Freq: Once | ORAL | Status: DC

## 2012-06-11 ENCOUNTER — Encounter (INDEPENDENT_AMBULATORY_CARE_PROVIDER_SITE_OTHER): Payer: Self-pay | Admitting: *Deleted

## 2012-06-14 ENCOUNTER — Telehealth (INDEPENDENT_AMBULATORY_CARE_PROVIDER_SITE_OTHER): Payer: Self-pay | Admitting: *Deleted

## 2012-06-14 NOTE — Telephone Encounter (Signed)
PCP/Requesting MD: ferguson  Name & DOB: [name] 2041-06-04     Procedure: tcs  Reason/Indication:  screening  Has patient had this procedure before?  yes  If so, when, by whom and where?  More than 10 yrs ago  Is there a family history of colon cancer?  no  Who?  What age when diagnosed?    Is patient diabetic?   no      Does patient have prosthetic heart valve?  no  Do you have a pacemaker?  no  Has patient had joint replacement within last 12 months?  no  Is patient on Coumadin, Plavix and/or Aspirin? no  Medications: see epic  Allergies: see epic  Medication Adjustment:   Procedure date & time: 07/05/12 at 730

## 2012-06-18 ENCOUNTER — Other Ambulatory Visit (HOSPITAL_COMMUNITY): Payer: Self-pay | Admitting: Internal Medicine

## 2012-06-18 DIAGNOSIS — H547 Unspecified visual loss: Secondary | ICD-10-CM

## 2012-06-18 NOTE — Telephone Encounter (Signed)
agree

## 2012-06-20 ENCOUNTER — Encounter (HOSPITAL_COMMUNITY): Payer: Self-pay | Admitting: Pharmacy Technician

## 2012-06-20 ENCOUNTER — Ambulatory Visit (HOSPITAL_COMMUNITY)
Admission: RE | Admit: 2012-06-20 | Discharge: 2012-06-20 | Disposition: A | Discharge status: Home / Self Care | Payer: Medicare Other | Source: Ambulatory Visit | Attending: Internal Medicine | Admitting: Internal Medicine

## 2012-06-20 DIAGNOSIS — H53129 Transient visual loss, unspecified eye: Secondary | ICD-10-CM | POA: Insufficient documentation

## 2012-06-20 DIAGNOSIS — H547 Unspecified visual loss: Secondary | ICD-10-CM

## 2012-06-20 DIAGNOSIS — I6529 Occlusion and stenosis of unspecified carotid artery: Secondary | ICD-10-CM | POA: Insufficient documentation

## 2012-07-05 ENCOUNTER — Ambulatory Visit (HOSPITAL_COMMUNITY)
Admission: RE | Admit: 2012-07-05 | Discharge: 2012-07-05 | Disposition: A | Discharge status: Home / Self Care | Payer: Medicare Other | Source: Ambulatory Visit | Attending: Internal Medicine | Admitting: Internal Medicine

## 2012-07-05 ENCOUNTER — Encounter (HOSPITAL_COMMUNITY)
Admission: RE | Disposition: A | Discharge status: Home / Self Care | Payer: Self-pay | Source: Ambulatory Visit | Attending: Internal Medicine

## 2012-07-05 ENCOUNTER — Encounter (HOSPITAL_COMMUNITY): Payer: Self-pay | Admitting: *Deleted

## 2012-07-05 DIAGNOSIS — Z1211 Encounter for screening for malignant neoplasm of colon: Secondary | ICD-10-CM

## 2012-07-05 DIAGNOSIS — K5730 Diverticulosis of large intestine without perforation or abscess without bleeding: Secondary | ICD-10-CM

## 2012-07-05 DIAGNOSIS — I10 Essential (primary) hypertension: Secondary | ICD-10-CM | POA: Insufficient documentation

## 2012-07-05 HISTORY — PX: COLONOSCOPY: SHX5424

## 2012-07-05 SURGERY — COLONOSCOPY
Anesthesia: Moderate Sedation

## 2012-07-05 MED ORDER — MEPERIDINE HCL 50 MG/ML IJ SOLN
INTRAMUSCULAR | Status: AC
  Filled 2012-07-05: qty 1

## 2012-07-05 MED ORDER — SIMETHICONE 40 MG/0.6ML PO SUSP
ORAL | Status: DC | PRN
  Administered 2012-07-05: 08:00:00

## 2012-07-05 MED ORDER — MIDAZOLAM HCL 5 MG/5ML IJ SOLN
INTRAMUSCULAR | Status: DC | PRN
  Administered 2012-07-05 (×3): 2 mg via INTRAVENOUS

## 2012-07-05 MED ORDER — MIDAZOLAM HCL 5 MG/5ML IJ SOLN
INTRAMUSCULAR | Status: AC
  Filled 2012-07-05: qty 10

## 2012-07-05 MED ORDER — MEPERIDINE HCL 50 MG/ML IJ SOLN
INTRAMUSCULAR | Status: DC | PRN
  Administered 2012-07-05 (×2): 25 mg via INTRAVENOUS

## 2012-07-05 MED ORDER — SODIUM CHLORIDE 0.45 % IV SOLN
Freq: Once | INTRAVENOUS | Status: AC
Start: 2012-07-05 — End: 2012-07-05
  Administered 2012-07-05: 1000 mL via INTRAVENOUS

## 2012-07-05 NOTE — H&P (Signed)
[name] is an 71 y.o. female.   Chief Complaint: Asian is here for colonoscopy. HPI: Patient is 71 year old Caucasian female who is here for screening colonoscopy. Her last exam was about 11 years ago. She denies abdominal pain change in her bowel habits or rectal bleeding. Family history is negative for colorectal carcinoma.  Past Medical History  Diagnosis Date  . Arthritis   . Asthma   . Hypertension   . GERD (gastroesophageal reflux disease)   . Obesity, morbid (more than 100 lbs over ideal weight or BMI > 40) 08/2011    Ht 5'7", wt 280 lb  . Bronchitis, chronic obstructive since 2000's  . Shortness of breath     unable to lay flat due to dyspnea  . CHF (congestive heart failure)     possible CHF on CXR of 08/15/11  . Hiatal hernia   . Cancer     dx with endometrial cancer   . PONV (postoperative nausea and vomiting)     RESP DIFFICULTY  POST OP    Past Surgical History  Procedure Date  . Shoulder surgery     right -removal of bone spur  . Rectocele repair   . Hysteroscopy w/d&c 08/15/2011    Procedure: DILATATION AND CURETTAGE (D&C) /HYSTEROSCOPY;  Surgeon: Tilda Burrow, MD;  Location: [facility];  Service: Gynecology;  Laterality: N/A;  With Suction Curette  . Cataract extraction   . Hiatal hernia repair   . Middle ear surgery   . Abdominal hysterectomy 09/12/2011    Procedure: HYSTERECTOMY ABDOMINAL;  Surgeon: Laurette Schimke, MD;  Location: [facility];  Service: Gynecology;  Laterality: N/A;  Total Abdominal Hysterectomy, Bilateral Salpingo Oophorectomy  . Salpingoophorectomy 09/12/2011    Procedure: SALPINGO OOPHERECTOMY;  Surgeon: Laurette Schimke, MD;  Location: [facility];  Service: Gynecology;  Laterality: Bilateral;    Family History  Problem Relation Age of Onset  . Anesthesia problems Neg Hx   . Hypotension Neg Hx   . Malignant hyperthermia Neg Hx   . Pseudochol deficiency Neg Hx   . Other Mother   . Cancer Father    Social History:  reports that she has  never smoked. She does not have any smokeless tobacco history on file. She reports that she does not drink alcohol or use illicit drugs.  Allergies:  Allergies  Allergen Reactions  . Carbamazepine Hives and Other (See Comments)    headache    Medications Prior to Admission  Medication Sig Dispense Refill  . albuterol (PROVENTIL) (5 MG/ML) 0.5% nebulizer solution Take 2.5 mg by nebulization every 4 (four) hours as needed. For shortness of breath      . aspirin EC 81 MG tablet Take 81 mg by mouth daily.      Marland Kitchen atenolol (TENORMIN) 50 MG tablet Take 25 mg by mouth 2 (two) times daily.       . budesonide-formoterol (SYMBICORT) 160-4.5 MCG/ACT inhaler Inhale 2 puffs into the lungs 2 (two) times daily as needed. For shortness of breath      . celecoxib (CELEBREX) 200 MG capsule Take 200 mg by mouth daily.       . cloNIDine (CATAPRES) 0.3 MG tablet Take 0.3 mg by mouth 2 (two) times daily.       Marland Kitchen docusate sodium (COLACE) 100 MG capsule Take 100 mg by mouth at bedtime.      Marland Kitchen doxylamine, Sleep, (UNISOM) 25 MG tablet Take 25 mg by mouth at bedtime.        Marland Kitchen  esomeprazole (NEXIUM) 40 MG capsule Take 40 mg by mouth 2 (two) times daily.       . furosemide (LASIX) 40 MG tablet Take 40 mg by mouth every morning.       . hydrochlorothiazide 25 MG tablet Take 25 mg by mouth every morning.       Marland Kitchen losartan (COZAAR) 50 MG tablet Take 50 mg by mouth Daily.      . Multiple Vitamin (MULTIVITAMIN WITH MINERALS) TABS Take 1 tablet by mouth daily.      . peg 3350 powder (MOVIPREP) 100 G SOLR Take 1 kit (100 g total) by mouth once.  1 kit  0  . simethicone (MYLICON) 125 MG chewable tablet Chew 125 mg by mouth daily.        Marland Kitchen atenolol (TENORMIN) 25 MG tablet Take 25 mg by mouth 2 (two) times daily.      Marland Kitchen loratadine (CLARITIN) 10 MG tablet Take 10 mg by mouth at bedtime as needed. Allergies.         No results found for this or any previous visit (from the past 48 hour(s)). No results found.  ROS  Blood  pressure 164/87, pulse 73, temperature 98 F (36.7 C), temperature source Oral, resp. rate 29, height 5\' 4"  (1.626 m), weight 250 lb (113.399 kg), SpO2 92.00%. Physical Exam  Constitutional: She appears well-developed and well-nourished.  HENT:  Mouth/Throat: Oropharynx is clear and moist.  Eyes: Conjunctivae are normal. No scleral icterus.  Neck: No thyromegaly present.  Cardiovascular: Normal rate, regular rhythm and normal heart sounds.   No murmur heard. Respiratory: Effort normal and breath sounds normal.  GI:       Protuberant abdomen; it is soft and nontender without organomegaly or mass  Musculoskeletal: She exhibits no edema.  Lymphadenopathy:    She has no cervical adenopathy.  Neurological: She is alert.  Skin: Skin is warm and dry.     Assessment/Plan Average risk screening colonoscopy.  REHMAN,NAJEEB U 07/05/2012, 9:08 AM

## 2012-07-05 NOTE — Op Note (Signed)
COLONOSCOPY PROCEDURE REPORT  PATIENT:  [name]  MR#:  [id number] Birthdate:  May 22, 1941, 71 y.o., female Endoscopist:  Dr. Malissa Hippo, MD Referred By:  Dr. Carylon Perches, MD Procedure Date: 07/05/2012  Procedure:   Colonoscopy  Indications:  Patient is 71 year old Caucasian female was undergoing average risk screening colonoscopy.  Informed Consent:  The procedure and risks were reviewed with the patient and informed consent was obtained.  Medications:  Demerol 50 mg IV Versed 6 mg IV  Description of procedure:  After a digital rectal exam was performed, that colonoscope was advanced from the anus through the rectum and colon to the area of the cecum, ileocecal valve and appendiceal orifice. The cecum was deeply intubated. These structures were well-seen and photographed for the record. From the level of the cecum and ileocecal valve, the scope was slowly and cautiously withdrawn. The mucosal surfaces were carefully surveyed utilizing scope tip to flexion to facilitate fold flattening as needed. The scope was pulled down into the rectum where a thorough exam including retroflexion was performed.  Findings:   Prep satisfactory. Pending colon with few diverticula at sigmoid and descending colon. Area behind the ileocecal valve could not be examined. No evidence of colonic polyps or other mucosal abnormalities. Normal rectal mucosa. Thickened anoderm. Therapeutic/Diagnostic Maneuvers Performed:  None  Complications:  None  Cecal Withdrawal Time:  NA minutes  Impression:  Examination performed to cecum the area behind the valve and appendiceal orifice not seen. Redundant colon with few diverticula at sigmoid and descending colon.   Recommendations:  Standard instructions given. Should return for barium enema in one month.  Glynn Yepes U  07/05/2012 10:00 AM  CC: Dr. Carylon Perches, MD & Dr. Bonnetta Barry ref. provider found

## 2012-07-12 ENCOUNTER — Encounter (HOSPITAL_COMMUNITY): Payer: Self-pay | Admitting: Internal Medicine

## 2012-07-23 ENCOUNTER — Ambulatory Visit (HOSPITAL_COMMUNITY)
Admission: RE | Admit: 2012-07-23 | Discharge: 2012-07-23 | Disposition: A | Discharge status: Home / Self Care | Payer: Medicare Other | Source: Ambulatory Visit | Attending: Neurology | Admitting: Neurology

## 2012-07-23 ENCOUNTER — Ambulatory Visit (HOSPITAL_COMMUNITY): Payer: Medicare Other

## 2012-07-23 DIAGNOSIS — I059 Rheumatic mitral valve disease, unspecified: Secondary | ICD-10-CM | POA: Insufficient documentation

## 2012-07-23 DIAGNOSIS — J449 Chronic obstructive pulmonary disease, unspecified: Secondary | ICD-10-CM

## 2012-07-23 DIAGNOSIS — I10 Essential (primary) hypertension: Secondary | ICD-10-CM | POA: Insufficient documentation

## 2012-07-23 DIAGNOSIS — R0602 Shortness of breath: Secondary | ICD-10-CM | POA: Insufficient documentation

## 2012-07-23 DIAGNOSIS — I079 Rheumatic tricuspid valve disease, unspecified: Secondary | ICD-10-CM | POA: Insufficient documentation

## 2012-07-23 DIAGNOSIS — I379 Nonrheumatic pulmonary valve disorder, unspecified: Secondary | ICD-10-CM | POA: Insufficient documentation

## 2012-07-23 DIAGNOSIS — J4489 Other specified chronic obstructive pulmonary disease: Secondary | ICD-10-CM | POA: Insufficient documentation

## 2012-07-23 NOTE — Progress Notes (Signed)
*  PRELIMINARY RESULTS* Echocardiogram 2D Echocardiogram has been performed.  [name] 07/23/2012, 3:40 PM

## 2012-09-03 ENCOUNTER — Encounter (INDEPENDENT_AMBULATORY_CARE_PROVIDER_SITE_OTHER): Payer: Self-pay | Admitting: *Deleted

## 2012-09-12 ENCOUNTER — Ambulatory Visit (INDEPENDENT_AMBULATORY_CARE_PROVIDER_SITE_OTHER): Payer: Medicare Other | Admitting: Otolaryngology

## 2012-09-12 DIAGNOSIS — R0982 Postnasal drip: Secondary | ICD-10-CM

## 2012-09-12 DIAGNOSIS — J343 Hypertrophy of nasal turbinates: Secondary | ICD-10-CM

## 2012-09-12 DIAGNOSIS — J310 Chronic rhinitis: Secondary | ICD-10-CM

## 2012-09-25 ENCOUNTER — Other Ambulatory Visit (INDEPENDENT_AMBULATORY_CARE_PROVIDER_SITE_OTHER): Payer: Self-pay | Admitting: Internal Medicine

## 2012-09-25 DIAGNOSIS — Q438 Other specified congenital malformations of intestine: Secondary | ICD-10-CM

## 2012-10-07 ENCOUNTER — Ambulatory Visit (HOSPITAL_COMMUNITY)
Admission: RE | Admit: 2012-10-07 | Discharge: 2012-10-07 | Disposition: A | Discharge status: Home / Self Care | Payer: Medicare Other | Source: Ambulatory Visit | Attending: Internal Medicine | Admitting: Internal Medicine

## 2012-10-07 DIAGNOSIS — K5730 Diverticulosis of large intestine without perforation or abscess without bleeding: Secondary | ICD-10-CM | POA: Insufficient documentation

## 2012-10-07 DIAGNOSIS — Q438 Other specified congenital malformations of intestine: Secondary | ICD-10-CM | POA: Insufficient documentation

## 2012-10-24 ENCOUNTER — Ambulatory Visit (INDEPENDENT_AMBULATORY_CARE_PROVIDER_SITE_OTHER): Payer: Medicare Other | Admitting: Otolaryngology

## 2012-10-24 DIAGNOSIS — J310 Chronic rhinitis: Secondary | ICD-10-CM

## 2012-11-01 ENCOUNTER — Encounter (INDEPENDENT_AMBULATORY_CARE_PROVIDER_SITE_OTHER): Payer: Self-pay | Admitting: *Deleted

## 2012-11-19 ENCOUNTER — Encounter (INDEPENDENT_AMBULATORY_CARE_PROVIDER_SITE_OTHER): Payer: Self-pay | Admitting: Internal Medicine

## 2012-11-19 ENCOUNTER — Ambulatory Visit (INDEPENDENT_AMBULATORY_CARE_PROVIDER_SITE_OTHER): Payer: Medicare Other | Admitting: Internal Medicine

## 2012-11-19 VITALS — BP 126/80 | HR 60 | Temp 97.5°F | Ht 64.0 in | Wt 270.8 lb

## 2012-11-19 DIAGNOSIS — K449 Diaphragmatic hernia without obstruction or gangrene: Secondary | ICD-10-CM

## 2012-11-19 DIAGNOSIS — K219 Gastro-esophageal reflux disease without esophagitis: Secondary | ICD-10-CM | POA: Insufficient documentation

## 2012-11-19 HISTORY — DX: Diaphragmatic hernia without obstruction or gangrene: K44.9

## 2012-11-19 NOTE — Patient Instructions (Addendum)
Take Zantac in the evening. Will schedule an UGI

## 2012-11-19 NOTE — Progress Notes (Signed)
Subjective:     Patient ID: [name], female   DOB: September 30, 1941, 72 y.o.   MRN: [id number]  HPIHere today for f/u.  She tells me she has a hiatal hernia. She takes Nexium on a daily basis.  Appetite is good.  She does have acid reflux controlled with Nexium but is not controlled after 12 N She has to have her head elevated on 2-3 pillows. Appetite is good.  Weight loss of about 10 pounds over the past 1 1/2 yrs.  She usually has a BM every other day. 06/2012 Colonoscopy: Impression:  Examination performed to cecum the area behind the valve and appendiceal orifice not seen.  Redundant colon with few diverticula at sigmoid and descending colon.   09/2012 Barium enema:IMPRESSION:  1. Scattered diverticulosis, greatest in the sigmoid colon.  2. No evidence of mass, stricture or polyp.   05/10/2004 EGD: FINAL DIAGNOSES:  1. Moderate sized sliding hiatal hernia with some food debris in the  dependent segment of his hernia.  2. Fundal wrap is nonexistent or completely gone.  3. Two patches of gastric-type mucosa at distal esophagus, very suspicious  for Barrett's; segment biopsy taken.    Review of Systemssee hpi Current Outpatient Prescriptions  Medication Sig Dispense Refill  . aspirin EC 81 MG tablet Take 81 mg by mouth daily.      Marland Kitchen atenolol (TENORMIN) 50 MG tablet Take 25 mg by mouth 2 (two) times daily.       . budesonide-formoterol (SYMBICORT) 160-4.5 MCG/ACT inhaler Inhale 2 puffs into the lungs 2 (two) times daily as needed. For shortness of breath      . celecoxib (CELEBREX) 200 MG capsule Take 200 mg by mouth daily.       . cloNIDine (CATAPRES) 0.3 MG tablet Take 0.3 mg by mouth 2 (two) times daily.       Marland Kitchen docusate sodium (COLACE) 100 MG capsule Take 100 mg by mouth at bedtime.      Marland Kitchen doxylamine, Sleep, (UNISOM) 25 MG tablet Take 25 mg by mouth at bedtime.        Marland Kitchen esomeprazole (NEXIUM) 40 MG capsule Take 40 mg by mouth 2 (two) times daily.       . furosemide (LASIX) 40 MG  tablet Take 40 mg by mouth every morning.       . hydrochlorothiazide 25 MG tablet Take 25 mg by mouth every morning.       . loratadine (CLARITIN) 10 MG tablet Take 10 mg by mouth at bedtime as needed. Allergies.       . Multiple Vitamin (MULTIVITAMIN WITH MINERALS) TABS Take 1 tablet by mouth daily.      . Multiple Vitamin (MULTIVITAMIN) tablet Take 1 tablet by mouth daily.      . simethicone (MYLICON) 125 MG chewable tablet Chew 125 mg by mouth daily.        . simvastatin (ZOCOR) 10 MG tablet Take 10 mg by mouth at bedtime.      Marland Kitchen albuterol (PROVENTIL) (5 MG/ML) 0.5% nebulizer solution Take 2.5 mg by nebulization every 4 (four) hours as needed. For shortness of breath      . atenolol (TENORMIN) 25 MG tablet Take 25 mg by mouth 2 (two) times daily.       Past Medical History  Diagnosis Date  . Arthritis   . Asthma   . Hypertension   . GERD (gastroesophageal reflux disease)   . Obesity, morbid (more than 100 lbs over ideal weight or  BMI > 40) 08/2011    Ht 5'7", wt 280 lb  . Bronchitis, chronic obstructive since 2000's  . Shortness of breath     unable to lay flat due to dyspnea  . CHF (congestive heart failure)     possible CHF on CXR of 08/15/11  . Hiatal hernia   . Cancer     dx with endometrial cancer   . PONV (postoperative nausea and vomiting)     RESP DIFFICULTY  POST OP  . Hiatal hernia 11/19/2012   Past Surgical History  Procedure Date  . Shoulder surgery     right -removal of bone spur  . Rectocele repair   . Hysteroscopy w/d&c 08/15/2011    Procedure: DILATATION AND CURETTAGE (D&C) /HYSTEROSCOPY;  Surgeon: Tilda Burrow, MD;  Location: [facility];  Service: Gynecology;  Laterality: N/A;  With Suction Curette  . Cataract extraction   . Hiatal hernia repair   . Middle ear surgery   . Abdominal hysterectomy 09/12/2011    Procedure: HYSTERECTOMY ABDOMINAL;  Surgeon: Laurette Schimke, MD;  Location: [facility];  Service: Gynecology;  Laterality: N/A;  Total Abdominal Hysterectomy,  Bilateral Salpingo Oophorectomy  . Salpingoophorectomy 09/12/2011    Procedure: SALPINGO OOPHERECTOMY;  Surgeon: Laurette Schimke, MD;  Location: [facility];  Service: Gynecology;  Laterality: Bilateral;  . Colonoscopy 07/05/2012    Procedure: COLONOSCOPY;  Surgeon: Malissa Hippo, MD;  Location: [facility];  Service: Endoscopy;  Laterality: N/A;  730   Allergies  Allergen Reactions  . Carbamazepine Hives and Other (See Comments)    headache        Objective:   Physical Exam Filed Vitals:   11/19/12 1514  BP: 126/80  Pulse: 60  Temp: 97.5 F (36.4 C)  Height: 5\' 4"  (1.626 m)  Weight: 270 lb 12.8 oz (122.834 kg)  Alert and oriented. Skin warm and dry. Oral mucosa is moist.   . Sclera anicteric, conjunctivae is pink. Thyroid not enlarged. No cervical lymphadenopathy. Lungs clear. Heart regular rate and rhythm.  Abdomen is soft. Bowel sounds are positive. No hepatomegaly. No abdominal masses felt. No tenderness.  No edema to lower extremities.        Assessment:    GERD. GERD worse during evening hours.Moderate Hiatal hernia per EGD in 2005. I discussed this case with Dr. Karilyn Cota.    Plan:     Take Zantac or other PPI OTC in the evening. Will schedule an upper GI to evaluate Hiatal hernia.

## 2012-11-25 ENCOUNTER — Ambulatory Visit (HOSPITAL_COMMUNITY)
Admission: RE | Admit: 2012-11-25 | Discharge: 2012-11-25 | Disposition: A | Discharge status: Home / Self Care | Payer: Medicare Other | Source: Ambulatory Visit | Attending: Internal Medicine | Admitting: Internal Medicine

## 2012-11-25 DIAGNOSIS — K224 Dyskinesia of esophagus: Secondary | ICD-10-CM | POA: Insufficient documentation

## 2012-11-25 DIAGNOSIS — K449 Diaphragmatic hernia without obstruction or gangrene: Secondary | ICD-10-CM | POA: Insufficient documentation

## 2012-11-25 DIAGNOSIS — K219 Gastro-esophageal reflux disease without esophagitis: Secondary | ICD-10-CM | POA: Insufficient documentation

## 2012-11-26 ENCOUNTER — Telehealth (INDEPENDENT_AMBULATORY_CARE_PROVIDER_SITE_OTHER): Payer: Self-pay | Admitting: Internal Medicine

## 2012-11-26 NOTE — Telephone Encounter (Signed)
No answer at home #

## 2012-11-28 ENCOUNTER — Telehealth (INDEPENDENT_AMBULATORY_CARE_PROVIDER_SITE_OTHER): Payer: Self-pay | Admitting: *Deleted

## 2012-11-28 NOTE — Telephone Encounter (Signed)
appt w/ Dr Leticia Penna -- 12/05/12 @ 200 (145), lmom advising patient of appt

## 2012-12-31 ENCOUNTER — Telehealth (INDEPENDENT_AMBULATORY_CARE_PROVIDER_SITE_OTHER): Payer: Self-pay | Admitting: *Deleted

## 2012-12-31 NOTE — Telephone Encounter (Signed)
Message left at home 

## 2012-12-31 NOTE — Telephone Encounter (Signed)
Korena LM asking Camelia Eng to please return her call to [phone] about a GI Test.

## 2013-01-02 NOTE — Telephone Encounter (Signed)
Message left at home again today

## 2013-01-07 NOTE — Telephone Encounter (Signed)
Message left at home. I have attempted several times to call her.

## 2013-01-27 ENCOUNTER — Telehealth (INDEPENDENT_AMBULATORY_CARE_PROVIDER_SITE_OTHER): Payer: Self-pay | Admitting: *Deleted

## 2013-01-27 NOTE — Telephone Encounter (Signed)
Patient states that she saw [name] had her set  up with Dr.Ziglar , he told the patient that GI test didn't show him a lot. He could tell that the mesh (Hernia) had come undone and left untreated it could turn to cancer. He told her he could do a Lap and if that did not work he would open her up.She fines this very concerning as in November 2012 she had problems with the Uterus [name] not satisfied .  She ask if Dr.Rehman could look at the test, if she has surgery can this happen again, and is it serious enough to cause  Cancer? She ask who would Dr.Rehman recommend if she needed to have surgery? Also she saw Dr.Teoh who did some test because of all the mucous , he gave her some sprays which have helped some but she has to rinse her mouth out every morning and on Saturday it was bloody and she ask if this could be coming from the hernia? If we call patient back tomorrow,01/28/13 she ask that we call [phone].

## 2013-01-27 NOTE — Telephone Encounter (Signed)
Forward to Dr.Rehman 

## 2013-01-27 NOTE — Telephone Encounter (Signed)
Would like for Dr. Karilyn Cota to return her call at [phone] or [phone]. She needs to talk with him about the GI services she received at [facility].

## 2013-01-28 NOTE — Telephone Encounter (Signed)
[name] please make an appointment in 6 weeks to see Dr.Rehman

## 2013-01-28 NOTE — Telephone Encounter (Signed)
Patient's call returned At this point no good indication for antireflux surgery She complains of mucus in her throat which she may or may not be due to GERD She is not having heartburn as long as she takes Nexium  Patient advised to see Dr. Suszanne Conners. Give her appointment with me in 6 weeks

## 2013-01-29 NOTE — Telephone Encounter (Signed)
Apt has been made for 03/11/13 with Dr. Karilyn Cota

## 2013-01-29 NOTE — Telephone Encounter (Signed)
Patient called and rescheduled to 03/18/13, afternoon, with Dr. Karilyn Cota.

## 2013-01-30 ENCOUNTER — Other Ambulatory Visit: Payer: Self-pay | Admitting: Neurology

## 2013-02-06 ENCOUNTER — Ambulatory Visit (INDEPENDENT_AMBULATORY_CARE_PROVIDER_SITE_OTHER): Payer: Medicare Other | Admitting: Otolaryngology

## 2013-02-06 DIAGNOSIS — J310 Chronic rhinitis: Secondary | ICD-10-CM

## 2013-02-06 DIAGNOSIS — K219 Gastro-esophageal reflux disease without esophagitis: Secondary | ICD-10-CM

## 2013-03-11 ENCOUNTER — Ambulatory Visit (INDEPENDENT_AMBULATORY_CARE_PROVIDER_SITE_OTHER): Payer: Medicare Other | Admitting: Internal Medicine

## 2013-03-18 ENCOUNTER — Encounter (INDEPENDENT_AMBULATORY_CARE_PROVIDER_SITE_OTHER): Payer: Self-pay | Admitting: Internal Medicine

## 2013-03-18 ENCOUNTER — Ambulatory Visit (INDEPENDENT_AMBULATORY_CARE_PROVIDER_SITE_OTHER): Payer: Medicare Other | Admitting: Internal Medicine

## 2013-03-18 VITALS — BP 128/80 | HR 72 | Temp 97.4°F | Resp 18 | Ht 64.0 in | Wt 274.1 lb

## 2013-03-18 DIAGNOSIS — K219 Gastro-esophageal reflux disease without esophagitis: Secondary | ICD-10-CM

## 2013-03-18 NOTE — Progress Notes (Signed)
Presenting complaint;  Followup for GERD.  Subjective:  Patient is 72 year old Caucasian female who is here for scheduled visit accompanied by her husband. She was last seen in January 2014 for chronic GERD. She had upper GI series which suggested esophageal motility disorder and moderate size hiatal hernia. Barium pill passed through esophagus without any delay. She states she does well as long as she takes her Nexium and watches her diet. She has breakthrough symptoms by the end of the day if she forgets to take her PPI. She has no side effects with Nexium. She has occasional dysphagia with dry foods. She denies episodes of food impaction. She also denies chronic cough or hoarseness. She had antireflux surgery back in 1999 and is not interested in having another surgery. She has good appetite. Her weight has been stable. She denies melena or rectal bleeding diarrhea or constipation. She has chronic knee pain and not able to exercise for long. She takes Celebrex with meals and reports no side effects.  Current Medications: Current Outpatient Prescriptions  Medication Sig Dispense Refill  . aspirin EC 81 MG tablet Take 81 mg by mouth at bedtime.       Marland Kitchen atenolol (TENORMIN) 50 MG tablet Take 25 mg by mouth 2 (two) times daily.       . budesonide-formoterol (SYMBICORT) 160-4.5 MCG/ACT inhaler Inhale 2 puffs into the lungs 2 (two) times daily as needed. For shortness of breath      . celecoxib (CELEBREX) 200 MG capsule Take 200 mg by mouth daily.       . cloNIDine (CATAPRES) 0.3 MG tablet Take 0.3 mg by mouth 2 (two) times daily.       Marland Kitchen diltiazem (CARDIZEM CD) 180 MG 24 hr capsule 180 mg at bedtime.       . docusate sodium (COLACE) 100 MG capsule Take 100 mg by mouth at bedtime.      Marland Kitchen doxylamine, Sleep, (UNISOM) 25 MG tablet Take 25 mg by mouth at bedtime.        Marland Kitchen esomeprazole (NEXIUM) 40 MG capsule Take 40 mg by mouth daily.       . furosemide (LASIX) 40 MG tablet Take 40 mg by mouth every  morning.       . hydrochlorothiazide 25 MG tablet Take 25 mg by mouth every morning.       Marland Kitchen losartan (COZAAR) 100 MG tablet 100 mg daily.       . meloxicam (MOBIC) 15 MG tablet 15 mg. Patient alternates with the Celebrex      . Multiple Vitamin (MULTIVITAMIN) tablet Take 1 tablet by mouth daily.      . simethicone (MYLICON) 125 MG chewable tablet Chew 125 mg by mouth daily.        . simvastatin (ZOCOR) 10 MG tablet TAKE ONE TABLET BY MOUTH EVERY DAY AFTER  DINNER  30 tablet  6  . albuterol (PROVENTIL) (5 MG/ML) 0.5% nebulizer solution Take 2.5 mg by nebulization every 4 (four) hours as needed. For shortness of breath      . atenolol (TENORMIN) 25 MG tablet Take 25 mg by mouth 2 (two) times daily.       No current facility-administered medications for this visit.     Objective: Blood pressure 128/80, pulse 72, temperature 97.4 F (36.3 C), temperature source Oral, resp. rate 18, height 5\' 4"  (1.626 m), weight 274 lb 1.6 oz (124.331 kg). Patient is alert and in no acute distress. Conjunctiva is pink. Sclera is nonicteric Oropharyngeal  mucosa is normal. No neck masses or thyromegaly noted. Cardiac exam with regular rhythm normal S1 and S2. No murmur or gallop noted. Lungs are clear to auscultation. Abdomen is full but soft and nontender without organomegaly or masses. No LE edema or clubbing noted.  Labs/studies Results: Upper GI series from 11/28/2012 reviewed with patient and her husband. She has moderate size sliding hiatal hernia.  Assessment:  #1. Chronic GERD with moderate size sliding heart hernia. Symptoms presently are well controlled with anti-reflex therapy. Therefore would not recommend surgical intervention. If somehow she is able to lose weight it would help great deal.    Plan:  Patient will continue anti-reflex measures and Nexium as before. She may be able to buy Nexium at a lower cost since it would become OTC later this month. Unless symptoms relapse she will  return for office visit in 6 months.

## 2013-03-18 NOTE — Patient Instructions (Signed)
Notify if Nexium stops working

## 2013-04-28 ENCOUNTER — Telehealth: Payer: Self-pay | Admitting: Gynecologic Oncology

## 2013-04-28 NOTE — Telephone Encounter (Signed)
Office Visit: Gyn-Onc  CC: Surveillance endometrial cancer  Assessment/Plan: 72 y.o.   with a stage IA grade 1 endometrioid adenocarcinoma history well postoperatively. Based on GOG criteria she does not require any additional therapy. She will  begin alternating visits with Dr. Emelda Fear and will see her in 6 months.  She will return to see our service in 12 months. Annual pap by Dr. Emelda Fear. Patient counseled on the signs and symptoms of recurrent disease.   HPI: HISTORY OF PRESENT ILLNESS: This is a 72 year old gravida 5, para 5, last normal menstrual period in her 77s. She was started on Celebrex  approximately 1 year ago. Approximately 7 to 8 months later, she noted some light vaginal spotting, the bleeding increased, and was associated  with an endometrial biopsy early in 2012 that was negative. Given persistent spotting, a D and C and hysteroscopy was performed on August 15, 2011, that was consistent with a FIGO grade 2 endometrial carcinoma.  She underwent a total abdominal hysterectomy BSO on November 6 year 2012. Pathology revealed a grade 1 endometrioid adenocarcinoma that measured 3.2 cm. The tumor invaded into 0.4 out of 1.5 cm of myometrial thickness for approximately 25%. There was no lymphovascular space involvement. The adnexa were negative. She comes in today for her postoperative check.  Based on GOG criteria she did not require any adjuvant therapy.   Social Hx:   History   Social History  . Marital Status: Married    Spouse Name: N/A    Number of Children: N/A  . Years of Education: N/A   Occupational History  . Not on file.   Social History Main Topics  . Smoking status: Never Smoker   . Smokeless tobacco: Never Used  . Alcohol Use: No  . Drug Use: No  . Sexually Active: Yes    Birth Control/ Protection: Post-menopausal   Other Topics Concern  . Not on file   Social History Narrative  . No narrative on file    Past Surgical Hx:  Past Surgical  History  Procedure Laterality Date  . Shoulder surgery      right -removal of bone spur  . Rectocele repair    . Hysteroscopy w/d&c  08/15/2011    Procedure: DILATATION AND CURETTAGE (D&C) /HYSTEROSCOPY;  Surgeon: Tilda Burrow, MD;  Location: [facility];  Service: Gynecology;  Laterality: N/A;  With Suction Curette  . Cataract extraction    . Hiatal hernia repair    . Middle ear surgery    . Abdominal hysterectomy  09/12/2011    Procedure: HYSTERECTOMY ABDOMINAL;  Surgeon: Laurette Schimke, MD;  Location: [facility];  Service: Gynecology;  Laterality: N/A;  Total Abdominal Hysterectomy, Bilateral Salpingo Oophorectomy  . Salpingoophorectomy  09/12/2011    Procedure: SALPINGO OOPHERECTOMY;  Surgeon: Laurette Schimke, MD;  Location: [facility];  Service: Gynecology;  Laterality: Bilateral;  . Colonoscopy  07/05/2012    Procedure: COLONOSCOPY;  Surgeon: Malissa Hippo, MD;  Location: [facility];  Service: Endoscopy;  Laterality: N/A;  730    Past Medical Hx:  Past Medical History  Diagnosis Date  . Arthritis   . Asthma   . Hypertension   . GERD (gastroesophageal reflux disease)   . Obesity, morbid (more than 100 lbs over ideal weight or BMI > 40) 08/2011    Ht 5'7", wt 280 lb  . Bronchitis, chronic obstructive since 2000's  . Shortness of breath     unable to lay flat due to dyspnea  .  CHF (congestive heart failure)     possible CHF on CXR of 08/15/11  . Hiatal hernia   . Cancer     dx with endometrial cancer   . PONV (postoperative nausea and vomiting)     RESP DIFFICULTY  POST OP  . Hiatal hernia 11/19/2012    Family Hx:  Family History  Problem Relation Age of Onset  . Anesthesia problems Neg Hx   . Hypotension Neg Hx   . Malignant hyperthermia Neg Hx   . Pseudochol deficiency Neg Hx   . Other Mother   . Cancer Father    Review of Systems Constitutional  Feels well  Cardiovascular  No chest pain, shortness of breath. Reports worsening edema of the lower extremities Pulmonary   No cough or wheeze.  Gastro Intestinal  No nausea, vomiting, or diarrhea. No bright red blood per rectum, no abdominal pain, reports frequent intermittent diarrhea.  Genito Urinary  No frequency, urgency, dysuria, no vaginal bleeding or discharge  Musculo Skeletal  No myalgia, arthralgia, joint swelling or pain  Neurologic  No weakness, change in gait Psychology  No depression, anxiety, insomnia.   Vitals:  Blood pressure 156/80, pulse 72, temperature 98.2 F (36.8 C), temperature source Oral, resp. rate 20, height 5' 4.33" (1.634 m), weight 271 lb 14.4 oz (123.333 kg).  Physical Exam: Well-nourished well-developed female in no acute distress. Chest: CTA Cardiac:  RRR 3/6 holosystolic murmer Back:  No CVAT LN survey:  No cervical supra clavicular or inguinal adenopathy Abdomen: Well-healed surgical midline incision. There is no evidence of an incisional hernia. Abdomen is soft and nontender. Pelvic: Normal external female genitalia. The vagina is atrophic. The vaginal cuff is intact.  No vaginal bleeding or discharge.  No nodularity or cul de sac masses.  Rectal:  Good tone, no masses EXT:  3+ edema bilaterally with cyanosis.  Laurette Schimke, MD 04/28/2013, 10:23 PM

## 2013-04-29 ENCOUNTER — Ambulatory Visit: Payer: Medicare Other | Attending: Gynecologic Oncology | Admitting: Gynecologic Oncology

## 2013-04-29 VITALS — BP 114/76 | HR 80 | Temp 97.7°F | Resp 20

## 2013-04-29 DIAGNOSIS — Z9079 Acquired absence of other genital organ(s): Secondary | ICD-10-CM | POA: Insufficient documentation

## 2013-04-29 DIAGNOSIS — C549 Malignant neoplasm of corpus uteri, unspecified: Secondary | ICD-10-CM | POA: Insufficient documentation

## 2013-04-29 DIAGNOSIS — I10 Essential (primary) hypertension: Secondary | ICD-10-CM | POA: Insufficient documentation

## 2013-04-29 DIAGNOSIS — Z90710 Acquired absence of both cervix and uterus: Secondary | ICD-10-CM | POA: Insufficient documentation

## 2013-04-29 DIAGNOSIS — K219 Gastro-esophageal reflux disease without esophagitis: Secondary | ICD-10-CM | POA: Insufficient documentation

## 2013-04-29 DIAGNOSIS — C541 Malignant neoplasm of endometrium: Secondary | ICD-10-CM

## 2013-04-29 DIAGNOSIS — K449 Diaphragmatic hernia without obstruction or gangrene: Secondary | ICD-10-CM | POA: Insufficient documentation

## 2013-04-29 NOTE — Patient Instructions (Addendum)
Followup with GYN in 6 months Followup with Dr. Emelda Fear in 12 months   Thank you very much [name] for allowing me to provide care for you today.  I appreciate your confidence in choosing our Gynecologic Oncology team.  If you have any questions about your visit today please call our office and we will get back to you as soon as possible.  Maryclare Labrador. Olivia Royse MD., PhD Gynecologic Oncology

## 2013-04-29 NOTE — Progress Notes (Signed)
Office Visit: Gyn-Onc  CC: Surveillance endometrial cancer  Assessment/Plan: 72 y.o.   with a stage IA grade 1 endometrioid adenocarcinoma history well postoperatively. Based on GOG criteria she does not require any additional therapy. She will return to see our service in 6 months and then followup annually with Dr. Emelda Fear thereafter. Patient counseled on the signs and symptoms of recurrent disease.   HPI: HISTORY OF PRESENT ILLNESS: This is a 72 year old gravida 5, para 5, last normal menstrual period in her 75s. She was started on Celebrex approximately 1 year ago. Approximately 7 to 8 months later, she noted some light vaginal spotting, the bleeding increased, and was associated with an endometrial biopsy early in 2012 that was negative. Given persistent spotting, a D and C and hysteroscopy was performed on August 15, 2011, that was consistent with a FIGO grade 2 endometrial carcinoma.  She underwent a total abdominal hysterectomy BSO on November 6 year 2012. Pathology revealed a grade 1 endometrioid adenocarcinoma that measured 3.2 cm. The tumor invaded into 0.4 out of 1.5 cm of myometrial thickness for approximately 25%. There was no lymphovascular space involvement. The adnexa were negative. She comes in today for her postoperative check.  Based on GOG criteria she did not require any adjuvant therapy.  She's been doing very well in the interim denies vaginal bleeding changes in bowel or bladder habits no cough shortness of breath.  Social Hx:   History   Social History  . Marital Status: Married    Spouse Name: N/A    Number of Children: N/A  . Years of Education: N/A   Occupational History  . Not on file.   Social History Main Topics  . Smoking status: Never Smoker   . Smokeless tobacco: Never Used  . Alcohol Use: No  . Drug Use: No  . Sexually Active: Yes    Birth Control/ Protection: Post-menopausal   Other Topics Concern  . Not on file   Social History Narrative  .  No narrative on file    Past Surgical Hx:  Past Surgical History  Procedure Laterality Date  . Shoulder surgery      right -removal of bone spur  . Rectocele repair    . Hysteroscopy w/d&c  08/15/2011    Procedure: DILATATION AND CURETTAGE (D&C) /HYSTEROSCOPY;  Surgeon: Tilda Burrow, MD;  Location: [facility];  Service: Gynecology;  Laterality: N/A;  With Suction Curette  . Cataract extraction    . Hiatal hernia repair    . Middle ear surgery    . Abdominal hysterectomy  09/12/2011    Procedure: HYSTERECTOMY ABDOMINAL;  Surgeon: Laurette Schimke, MD;  Location: [facility];  Service: Gynecology;  Laterality: N/A;  Total Abdominal Hysterectomy, Bilateral Salpingo Oophorectomy  . Salpingoophorectomy  09/12/2011    Procedure: SALPINGO OOPHERECTOMY;  Surgeon: Laurette Schimke, MD;  Location: [facility];  Service: Gynecology;  Laterality: Bilateral;  . Colonoscopy  07/05/2012    Procedure: COLONOSCOPY;  Surgeon: Malissa Hippo, MD;  Location: [facility];  Service: Endoscopy;  Laterality: N/A;  730    Past Medical Hx:  Past Medical History  Diagnosis Date  . Arthritis   . Asthma   . Hypertension   . GERD (gastroesophageal reflux disease)   . Obesity, morbid (more than 100 lbs over ideal weight or BMI > 40) 08/2011    Ht 5'7", wt 280 lb  . Bronchitis, chronic obstructive since 2000's  . Shortness of breath     unable to lay flat  due to dyspnea  . CHF (congestive heart failure)     possible CHF on CXR of 08/15/11  . Hiatal hernia   . Cancer     dx with endometrial cancer   . PONV (postoperative nausea and vomiting)     RESP DIFFICULTY  POST OP  . Hiatal hernia 11/19/2012    Family Hx:  Family History  Problem Relation Age of Onset  . Anesthesia problems Neg Hx   . Hypotension Neg Hx   . Malignant hyperthermia Neg Hx   . Pseudochol deficiency Neg Hx   . Other Mother   . Cancer Father    Review of Systems Constitutional  Feels well  Cardiovascular  No chest pain, shortness of breath.   Pulmonary  No cough or wheeze.  Gastro Intestinal  No nausea, vomiting, or diarrhea. No bright red blood per rectum, no abdominal pain,    Genito Urinary  No frequency, urgency, dysuria, no vaginal bleeding or discharge  Musculo Skeletal  No myalgia, arthralgia, joint swelling or pain  Neurologic  No weakness, no change in gait Psychology  No depression, anxiety, insomnia.   Vitals:  Blood pressure 114/76, pulse 80, temperature 97.7 F (36.5 C), temperature source Oral, resp. rate 20.  Physical Exam: Well-nourished well-developed female in no acute distress. Chest: CTA Cardiac:  RRR 3/6 holosystolic murmer Back:  No CVAT LN survey:  No cervical supra clavicular or inguinal adenopathy Abdomen: Well-healed surgical midline incision. There is no evidence of an incisional hernia. Abdomen is soft and nontender. Pelvic: Normal external female genitalia. The vagina is atrophic. The vaginal cuff is intact.  No vaginal bleeding or discharge.  No nodularity or cul de sac masses.  Rectal: Fair tone, no masses EXT:  3+ edema bilaterally with cyanosis.  Laurette Schimke, MD 04/29/2013, 3:19 PM

## 2013-06-03 ENCOUNTER — Ambulatory Visit (INDEPENDENT_AMBULATORY_CARE_PROVIDER_SITE_OTHER): Payer: Medicare Other | Admitting: Orthopedic Surgery

## 2013-06-03 ENCOUNTER — Ambulatory Visit (INDEPENDENT_AMBULATORY_CARE_PROVIDER_SITE_OTHER): Payer: Medicare Other

## 2013-06-03 ENCOUNTER — Other Ambulatory Visit: Payer: Self-pay | Admitting: *Deleted

## 2013-06-03 ENCOUNTER — Encounter: Payer: Self-pay | Admitting: Orthopedic Surgery

## 2013-06-03 VITALS — BP 143/69 | Ht 64.0 in | Wt 270.0 lb

## 2013-06-03 DIAGNOSIS — M899 Disorder of bone, unspecified: Secondary | ICD-10-CM

## 2013-06-03 DIAGNOSIS — M189 Osteoarthritis of first carpometacarpal joint, unspecified: Secondary | ICD-10-CM | POA: Insufficient documentation

## 2013-06-03 DIAGNOSIS — M25531 Pain in right wrist: Secondary | ICD-10-CM

## 2013-06-03 DIAGNOSIS — M25539 Pain in unspecified wrist: Secondary | ICD-10-CM

## 2013-06-03 DIAGNOSIS — M19049 Primary osteoarthritis, unspecified hand: Secondary | ICD-10-CM

## 2013-06-03 DIAGNOSIS — D480 Neoplasm of uncertain behavior of bone and articular cartilage: Secondary | ICD-10-CM

## 2013-06-03 MED ORDER — TRAMADOL-ACETAMINOPHEN 37.5-325 MG PO TABS: 1.0000 | ORAL_TABLET | ORAL | Status: DC | PRN

## 2013-06-03 MED ORDER — PREDNISONE 10 MG PO KIT: 10.0000 mg | PACK | ORAL | Status: DC

## 2013-06-03 NOTE — Progress Notes (Signed)
Patient ID: [name], female   DOB: 12/13/1940, 72 y.o.   MRN: [id number] Chief Complaint  Patient presents with  . Wrist Pain    Bilateral wrist pain, no injury.   Wrist Pain  The pain is present in the left wrist and right wrist. This is a new problem. The current episode started 1 to 4 weeks ago. There has been no history of extremity trauma. The problem occurs intermittently. The problem has been unchanged. The quality of the pain is described as pounding. Associated symptoms include joint swelling. Pertinent negatives include no fever, joint locking, limited range of motion, numbness or tingling.     Review of Systems  Constitutional: Negative for fever.  Respiratory: Positive for shortness of breath.   Gastrointestinal: Positive for heartburn.  Genitourinary: Positive for frequency.  Musculoskeletal: Positive for myalgias and joint pain.  Neurological: Negative for tingling and numbness.  All other systems reviewed and are negative.   BP 143/69  Ht 5\' 4"  (1.626 m)  Wt 270 lb (122.471 kg)  BMI 46.32 kg/m2 General appearance is normal, the patient is alert and oriented x3 with normal mood and affect. Ambulation is normal   Ortho Exam Both wrists: bilateral tenderness and swelling over the right wrist is more swollen than the tenderness over the distal radius. There is decreased range of motion painful range of motion without signs or symptoms of carpal tunnel syndrome No instability is detected on the left side there is tenderness at the base of the thumb. Skin remains intact bilaterally good pulses are noted radial and ulnar temperature is normal no edema sensation remains intact  The x-rays show a degenerative condition on the left side at the base of the first metacarpal and a radiolucent lesion in the metaphyseal epiphyseal region of the right side.  Recommend CT scan to evaluate bone lesion  Recommend splint for basilar joint arthrosis. Patient returns after studies  have been completed

## 2013-06-03 NOTE — Patient Instructions (Addendum)
The left wrist has arthritis at the base of the thumb you'll need a splint for 6 weeks  The right wrist has a bone lesion which will need a further test which we will arrange.  For pain for now we can prescribe a pain reliever for you Ultracet one tablet every 4 hours when necessary pain  She will followup after your scan has been done on the right wrist.

## 2013-06-11 ENCOUNTER — Telehealth: Payer: Self-pay | Admitting: Radiology

## 2013-06-11 NOTE — Telephone Encounter (Signed)
Patient has at CT scan at [facility] on 06-17-13 at 10:00. Patient has [facility], authorization # [id number] and it expires on 07-26-13. Patient is aware to go get blood work before CT. Patient will follow up back here in the office for her results.

## 2013-06-16 LAB — BUN: BUN: 18 mg/dL (ref 6–23)

## 2013-06-16 LAB — CREATININE, SERUM: Creat: 0.76 mg/dL (ref 0.50–1.10)

## 2013-06-17 ENCOUNTER — Ambulatory Visit (HOSPITAL_COMMUNITY)
Admission: RE | Admit: 2013-06-17 | Discharge: 2013-06-17 | Disposition: A | Discharge status: Home / Self Care | Payer: Medicare Other | Source: Ambulatory Visit | Attending: Orthopedic Surgery | Admitting: Orthopedic Surgery

## 2013-06-17 DIAGNOSIS — M25539 Pain in unspecified wrist: Secondary | ICD-10-CM | POA: Insufficient documentation

## 2013-06-17 DIAGNOSIS — W19XXXA Unspecified fall, initial encounter: Secondary | ICD-10-CM | POA: Insufficient documentation

## 2013-06-17 DIAGNOSIS — R937 Abnormal findings on diagnostic imaging of other parts of musculoskeletal system: Secondary | ICD-10-CM | POA: Insufficient documentation

## 2013-06-17 DIAGNOSIS — M899 Disorder of bone, unspecified: Secondary | ICD-10-CM

## 2013-06-17 DIAGNOSIS — S59909A Unspecified injury of unspecified elbow, initial encounter: Secondary | ICD-10-CM | POA: Insufficient documentation

## 2013-06-17 DIAGNOSIS — S6990XA Unspecified injury of unspecified wrist, hand and finger(s), initial encounter: Secondary | ICD-10-CM | POA: Insufficient documentation

## 2013-06-17 MED ORDER — IOHEXOL 300 MG/ML  SOLN
75.0000 mL | Freq: Once | INTRAMUSCULAR | Status: AC | PRN
  Administered 2013-06-17: 75 mL via INTRAVENOUS

## 2013-06-23 ENCOUNTER — Ambulatory Visit (INDEPENDENT_AMBULATORY_CARE_PROVIDER_SITE_OTHER): Payer: Medicare Other | Admitting: Orthopedic Surgery

## 2013-06-23 VITALS — BP 124/60 | Ht 64.0 in | Wt 270.0 lb

## 2013-06-23 DIAGNOSIS — M189 Osteoarthritis of first carpometacarpal joint, unspecified: Secondary | ICD-10-CM

## 2013-06-23 DIAGNOSIS — M19049 Primary osteoarthritis, unspecified hand: Secondary | ICD-10-CM

## 2013-06-23 DIAGNOSIS — M109 Gout, unspecified: Secondary | ICD-10-CM

## 2013-06-23 NOTE — Patient Instructions (Addendum)
Call if recurrent symptoms

## 2013-06-23 NOTE — Progress Notes (Signed)
Patient ID: [name], female   DOB: 12/06/1940, 72 y.o.   MRN: [id number]  Chief Complaint  Patient presents with  . 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  Amyloid should be considered if there is renal insufficiency.  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  Gout, inflammatory arthritis, no evidence of bone tumor  Recommend splint for basilar joint arthrosis. Patient returns after studies have been completed  Today: my pain is completely gone   System review abdominal pain after prednisone taper  General appearance is normal, the patient is alert and oriented x3 with normal mood and affect. BP 124/60  Ht 5\' 4"  (1.626 m)  Wt 270 lb (122.471 kg)  BMI 46.32 kg/m2 There was no tumor there is no tenderness range of motion has improved there is no swelling  She will call us back if necessary for another prescription of prednisone but at the lower dosing.  CT scan and report reviewed

## 2013-06-25 ENCOUNTER — Encounter: Payer: Self-pay | Admitting: Orthopedic Surgery

## 2013-07-28 ENCOUNTER — Telehealth (INDEPENDENT_AMBULATORY_CARE_PROVIDER_SITE_OTHER): Payer: Self-pay | Admitting: *Deleted

## 2013-07-28 NOTE — Telephone Encounter (Signed)
[name] came by the office stating she is waking up in the am with muscus coating the top of mouth also tongue. Her mouth can be dry but will still have the muscus. It can arrange from clear, yellow to red. Has seen an ENT and he states it is not sinus. Dr. Karilyn Cota was to have received a progress note from him. The return phone number is  [phone] or [phone].

## 2013-07-30 NOTE — Telephone Encounter (Signed)
On 07/29/13 , I spoke with the patient. [name] has phlegm every morning in the back of her throat and there has been times she has seen dried blood and is very concerned. She saw Dr.Teoh who ruled out sinus problems.She is currently taking Nexium 40 mg daily. She has a office appointment with Dr.Rehman on November 10 th but would like to know what Dr.Rehman thinks or what may be done?

## 2013-07-30 NOTE — Telephone Encounter (Signed)
Apt has been scheduled for 08/01/13 with Dr. Karilyn Cota.

## 2013-07-30 NOTE — Telephone Encounter (Signed)
Dr.Rehman the patient is also asking if this may be coming from her hernia.

## 2013-07-30 NOTE — Telephone Encounter (Signed)
Already addressed

## 2013-07-30 NOTE — Telephone Encounter (Signed)
OV this Friday.

## 2013-08-01 ENCOUNTER — Ambulatory Visit (INDEPENDENT_AMBULATORY_CARE_PROVIDER_SITE_OTHER): Payer: Medicare Other | Admitting: Internal Medicine

## 2013-08-05 ENCOUNTER — Ambulatory Visit (INDEPENDENT_AMBULATORY_CARE_PROVIDER_SITE_OTHER): Payer: Medicare Other | Admitting: Internal Medicine

## 2013-08-05 ENCOUNTER — Encounter (INDEPENDENT_AMBULATORY_CARE_PROVIDER_SITE_OTHER): Payer: Self-pay | Admitting: Internal Medicine

## 2013-08-05 DIAGNOSIS — K219 Gastro-esophageal reflux disease without esophagitis: Secondary | ICD-10-CM

## 2013-08-05 DIAGNOSIS — K1370 Unspecified lesions of oral mucosa: Secondary | ICD-10-CM

## 2013-08-05 DIAGNOSIS — K1379 Other lesions of oral mucosa: Secondary | ICD-10-CM

## 2013-08-05 NOTE — Progress Notes (Signed)
Presenting complaint;  Complains of having blood in his secretions in the mouth every morning.  Subjective:  Patient is 72 year old Caucasian female who presents with complains of having blood-tinged secretions in her mouth every morning. This symptom has been going on for a few months. Dr. Ouida Sills referred her to Dr. Suszanne Conners and ENT evaluation was negative. Patient is very concerned about the symptoms that she has history of uterine cancer. She does not experience this symptom during the daytime. She wakes up 2-3 times every night to empty her bladder. She usually does not have glasses because when she wakes up the first time. She states her GERD symptoms are well controlled. She rarely has heartburn and regurgitation or belching. She also denies hoarseness chronic cough or sore throat. She does report that she is a mouth breather. She has good appetite and her weight is stable. She walks half to three fourths of a mild every day. Her bowels move regularly and she denies melena or rectal bleeding. Last EGD was in July 2005 revealing moderate size hiatal hernia.  Current Medications: Current Outpatient Prescriptions  Medication Sig Dispense Refill  . albuterol (PROVENTIL) (5 MG/ML) 0.5% nebulizer solution Take 2.5 mg by nebulization every 4 (four) hours as needed. For shortness of breath      . aspirin EC 81 MG tablet Take 81 mg by mouth at bedtime.       Marland Kitchen atenolol (TENORMIN) 25 MG tablet Take 25 mg by mouth 2 (two) times daily.      Marland Kitchen atenolol (TENORMIN) 50 MG tablet Take 25 mg by mouth 2 (two) times daily.       . budesonide-formoterol (SYMBICORT) 160-4.5 MCG/ACT inhaler Inhale 2 puffs into the lungs 2 (two) times daily as needed. For shortness of breath      . celecoxib (CELEBREX) 200 MG capsule Take 200 mg by mouth daily.       . cloNIDine (CATAPRES) 0.3 MG tablet Take 0.3 mg by mouth 2 (two) times daily.       Marland Kitchen diltiazem (CARDIZEM CD) 180 MG 24 hr capsule 180 mg at bedtime.       . docusate  sodium (COLACE) 100 MG capsule Take 100 mg by mouth at bedtime.      Marland Kitchen doxylamine, Sleep, (UNISOM) 25 MG tablet Take 25 mg by mouth at bedtime.        Marland Kitchen esomeprazole (NEXIUM) 40 MG capsule Take 40 mg by mouth daily.       . furosemide (LASIX) 40 MG tablet Take 40 mg by mouth every morning.       . hydrochlorothiazide 25 MG tablet Take 25 mg by mouth every morning.       Marland Kitchen losartan (COZAAR) 100 MG tablet 100 mg daily.       . Multiple Vitamin (MULTIVITAMIN) tablet Take 1 tablet by mouth daily.      . PredniSONE 10 MG KIT Take 1 kit (10 mg total) by mouth as directed.  1 kit  0  . simethicone (MYLICON) 125 MG chewable tablet Chew 125 mg by mouth daily.        . simvastatin (ZOCOR) 10 MG tablet TAKE ONE TABLET BY MOUTH EVERY DAY AFTER  DINNER  30 tablet  6  . traMADol-acetaminophen (ULTRACET) 37.5-325 MG per tablet Take 1 tablet by mouth every 4 (four) hours as needed for pain.  90 tablet  5   No current facility-administered medications for this visit.     Objective: There were no vitals  taken for this visit. Patient is alert and appears to be comfortable. Conjunctiva is pink. Sclera is nonicteric Oropharyngeal mucosa poorly examined and no mucosal abnormalities noted. No neck masses or thyromegaly noted. Cardiac exam with regular rhythm normal S1 and S2. No murmur or gallop noted. Lungs are clear to auscultation. Abdomen is full but soft and nontender without organomegaly or masses.  No LE edema or clubbing noted.  Labs/studies Results:  She had upper GI series on 11/28/2038 revealing large hiatal hernia GE reflux and esophageal dysmotility.  Assessment:  #1. Patient's symptom of noticing blood tinged secretions in oral cavity every morning would appear to be secondary to dry mouth resulting in mucosal injury. This symptom only occurs when she wakes up in the morning. I do not believe that this symptom is secondary to GE reflux and similarly she does not have  cough.    Plan:  Patient advised to use artificial saliva every time she wakes up at night to go to bathroom. She will keep symptom diary until office visit on 09/15/2013.

## 2013-08-05 NOTE — Patient Instructions (Signed)
Keep symptom diary until next office visit. Use artificial saliva when you wake up at night to empty bladder.

## 2013-08-12 ENCOUNTER — Other Ambulatory Visit (HOSPITAL_COMMUNITY): Payer: Self-pay | Admitting: Internal Medicine

## 2013-08-12 DIAGNOSIS — Z139 Encounter for screening, unspecified: Secondary | ICD-10-CM

## 2013-08-25 ENCOUNTER — Ambulatory Visit (HOSPITAL_COMMUNITY)
Admission: RE | Admit: 2013-08-25 | Discharge: 2013-08-25 | Disposition: A | Discharge status: Home / Self Care | Payer: Medicare Other | Source: Ambulatory Visit | Attending: Internal Medicine | Admitting: Internal Medicine

## 2013-08-25 DIAGNOSIS — Z139 Encounter for screening, unspecified: Secondary | ICD-10-CM

## 2013-08-25 DIAGNOSIS — Z1231 Encounter for screening mammogram for malignant neoplasm of breast: Secondary | ICD-10-CM | POA: Insufficient documentation

## 2013-09-11 ENCOUNTER — Other Ambulatory Visit: Payer: Self-pay

## 2013-09-15 ENCOUNTER — Encounter (INDEPENDENT_AMBULATORY_CARE_PROVIDER_SITE_OTHER): Payer: Self-pay | Admitting: Internal Medicine

## 2013-09-15 ENCOUNTER — Ambulatory Visit (INDEPENDENT_AMBULATORY_CARE_PROVIDER_SITE_OTHER): Payer: Medicare Other | Admitting: Internal Medicine

## 2013-09-15 VITALS — BP 130/70 | HR 74 | Temp 98.6°F | Resp 18 | Ht 64.0 in | Wt 268.8 lb

## 2013-09-15 DIAGNOSIS — K1370 Unspecified lesions of oral mucosa: Secondary | ICD-10-CM

## 2013-09-15 DIAGNOSIS — K1379 Other lesions of oral mucosa: Secondary | ICD-10-CM

## 2013-09-15 DIAGNOSIS — K219 Gastro-esophageal reflux disease without esophagitis: Secondary | ICD-10-CM

## 2013-09-15 NOTE — Progress Notes (Signed)
Presenting complaint;  Followup for oral bleeding. Patient has chronic GERD.  Subjective:  Patient is 72 year old Caucasian female who has chronic GERD and was last seen on 08/05/2013 with complaints of finding blood in her mouth on making up every morning. Prior ENT evaluation was negative. I felt her symptom was not due to GERD as her symptoms have been well controlled. Patient was advised to use artificial saliva for going to bed and if she were to wake up in the middle of night. She has been doing this and has not seen any more blood tinged secretions since her last visit. She rarely experiences heartburn and she states she has gotten best relief with Nexium. She denies dysphagia.  Current Medications: Current Outpatient Prescriptions  Medication Sig Dispense Refill  . aspirin EC 81 MG tablet Take 81 mg by mouth at bedtime.       Marland Kitchen atenolol (TENORMIN) 50 MG tablet Take 25 mg by mouth 2 (two) times daily.       . budesonide-formoterol (SYMBICORT) 160-4.5 MCG/ACT inhaler Inhale 2 puffs into the lungs 2 (two) times daily as needed. For shortness of breath      . celecoxib (CELEBREX) 200 MG capsule Take 200 mg by mouth daily.       . cloNIDine (CATAPRES) 0.3 MG tablet Take 0.3 mg by mouth 2 (two) times daily.       Marland Kitchen diltiazem (CARDIZEM CD) 180 MG 24 hr capsule 180 mg at bedtime.       . docusate sodium (COLACE) 100 MG capsule Take 100 mg by mouth at bedtime.      Marland Kitchen doxylamine, Sleep, (UNISOM) 25 MG tablet Take 25 mg by mouth at bedtime.        Marland Kitchen esomeprazole (NEXIUM) 40 MG capsule Take 40 mg by mouth daily.       . hydrochlorothiazide 25 MG tablet Take 25 mg by mouth every morning.       Marland Kitchen losartan (COZAAR) 100 MG tablet 100 mg daily.       . Multiple Vitamin (MULTIVITAMIN) tablet Take 1 tablet by mouth daily.      . simethicone (MYLICON) 125 MG chewable tablet Chew 125 mg by mouth daily.        . simvastatin (ZOCOR) 10 MG tablet TAKE ONE TABLET BY MOUTH EVERY DAY AFTER  DINNER  30 tablet   6  . traMADol-acetaminophen (ULTRACET) 37.5-325 MG per tablet Take 1 tablet by mouth every 4 (four) hours as needed for pain.  90 tablet  5  . albuterol (PROVENTIL) (5 MG/ML) 0.5% nebulizer solution Take 2.5 mg by nebulization every 4 (four) hours as needed. For shortness of breath      . furosemide (LASIX) 40 MG tablet Take 40 mg by mouth every morning.        No current facility-administered medications for this visit.     Objective: Blood pressure 130/70, pulse 74, temperature 98.6 F (37 C), temperature source Oral, resp. rate 18, height 5\' 4"  (1.626 m), weight 268 lb 12.8 oz (121.927 kg). Patient is alert and in no acute distress. Conjunctiva is pink. Sclera is nonicteric Oropharyngeal mucosa is normal. No neck masses or thyromegaly noted. Cardiac exam with regular rhythm normal S1 and S2. No murmur or gallop noted. Lungs are clear to auscultation. Abdomen is obese but soft and nontender without organomegaly or masses.  No LE edema or clubbing noted.    Assessment:  #1. Oral bleeding secondary to nocturnal xerostomia. It has resolved since she  has been using artificial saliva. #2. Chronic GERD. Symptoms well controlled with therapy.    Plan:  Continue Nexium at 40 mg by mouth every morning. Office visit in one year.

## 2013-09-15 NOTE — Patient Instructions (Signed)
Call if Nexium stops working.

## 2013-10-08 ENCOUNTER — Other Ambulatory Visit: Payer: Self-pay | Admitting: *Deleted

## 2013-10-08 DIAGNOSIS — M25531 Pain in right wrist: Secondary | ICD-10-CM

## 2013-10-08 DIAGNOSIS — D480 Neoplasm of uncertain behavior of bone and articular cartilage: Secondary | ICD-10-CM

## 2013-10-08 DIAGNOSIS — M189 Osteoarthritis of first carpometacarpal joint, unspecified: Secondary | ICD-10-CM

## 2013-10-08 MED ORDER — TRAMADOL-ACETAMINOPHEN 37.5-325 MG PO TABS: 1.0000 | ORAL_TABLET | ORAL | Status: DC | PRN

## 2013-10-23 ENCOUNTER — Ambulatory Visit: Payer: Medicare Other | Admitting: Gynecologic Oncology

## 2013-12-04 ENCOUNTER — Ambulatory Visit: Payer: Medicare Other | Attending: Gynecologic Oncology | Admitting: Gynecologic Oncology

## 2013-12-04 ENCOUNTER — Encounter: Payer: Self-pay | Admitting: Gynecologic Oncology

## 2013-12-04 ENCOUNTER — Other Ambulatory Visit (HOSPITAL_COMMUNITY)
Admission: RE | Admit: 2013-12-04 | Discharge: 2013-12-04 | Disposition: A | Discharge status: Home / Self Care | Payer: Medicare Other | Source: Ambulatory Visit | Attending: Gynecologic Oncology | Admitting: Gynecologic Oncology

## 2013-12-04 VITALS — BP 134/56 | HR 58 | Temp 97.8°F | Resp 20 | Ht 64.0 in | Wt 270.0 lb

## 2013-12-04 DIAGNOSIS — Z9079 Acquired absence of other genital organ(s): Secondary | ICD-10-CM | POA: Insufficient documentation

## 2013-12-04 DIAGNOSIS — Z90710 Acquired absence of both cervix and uterus: Secondary | ICD-10-CM | POA: Insufficient documentation

## 2013-12-04 DIAGNOSIS — Z124 Encounter for screening for malignant neoplasm of cervix: Secondary | ICD-10-CM | POA: Insufficient documentation

## 2013-12-04 DIAGNOSIS — J45909 Unspecified asthma, uncomplicated: Secondary | ICD-10-CM | POA: Insufficient documentation

## 2013-12-04 DIAGNOSIS — R110 Nausea: Secondary | ICD-10-CM | POA: Insufficient documentation

## 2013-12-04 DIAGNOSIS — R230 Cyanosis: Secondary | ICD-10-CM | POA: Insufficient documentation

## 2013-12-04 DIAGNOSIS — C541 Malignant neoplasm of endometrium: Secondary | ICD-10-CM

## 2013-12-04 DIAGNOSIS — Z09 Encounter for follow-up examination after completed treatment for conditions other than malignant neoplasm: Secondary | ICD-10-CM | POA: Insufficient documentation

## 2013-12-04 DIAGNOSIS — N952 Postmenopausal atrophic vaginitis: Secondary | ICD-10-CM | POA: Insufficient documentation

## 2013-12-04 DIAGNOSIS — Z8542 Personal history of malignant neoplasm of other parts of uterus: Secondary | ICD-10-CM | POA: Insufficient documentation

## 2013-12-04 DIAGNOSIS — K219 Gastro-esophageal reflux disease without esophagitis: Secondary | ICD-10-CM | POA: Insufficient documentation

## 2013-12-04 DIAGNOSIS — R609 Edema, unspecified: Secondary | ICD-10-CM | POA: Insufficient documentation

## 2013-12-04 DIAGNOSIS — I10 Essential (primary) hypertension: Secondary | ICD-10-CM | POA: Insufficient documentation

## 2013-12-04 NOTE — Progress Notes (Signed)
Office Visit: Gyn-Onc  CC: Surveillance endometrial cancer  Assessment/Plan: 73 y.o.   with a stage IA grade 1 endometrioid adenocarcinoma history well postoperatively. Based on GOG criteria she does not require any additional therapy.  Followup annually with Dr. Glo Herring Patient counseled on the signs and symptoms of recurrent disease. Pap collected today Advised to F/u with PCP if emesis.   HPI: HISTORY OF PRESENT ILLNESS: This is a 73 year old gravida 5, para 5, last normal menstrual period in her 15s. She was started on Celebrex approximately 1 year ago. Approximately 7 to 8 months later, she noted some light vaginal spotting, the bleeding increased, and was associated with an endometrial biopsy early in 2012 that was negative. Given persistent spotting, a D and C and hysteroscopy was performed on August 15, 2011, that was consistent with a FIGO grade 2 endometrial carcinoma.  She underwent a total abdominal hysterectomy BSO on November 6 year 2012. Pathology revealed a grade 1 endometrioid adenocarcinoma that measured 3.2 cm. The tumor invaded into 0.4 out of 1.5 cm of myometrial thickness for approximately 25%. There was no lymphovascular space involvement. The adnexa were negative. She comes in today for her postoperative check.  Based on GOG criteria she did not require any adjuvant therapy.  She c/o heat and nausea.  Several members of the family have had flu associated emesis. No vaginal bleeding changes in bowel or bladder habits no cough shortness of breath.  Past Surgical Hx:  Past Surgical History  Procedure Laterality Date  . Shoulder surgery      right -removal of bone spur  . Rectocele repair    . Hysteroscopy w/d&c  08/15/2011    Procedure: DILATATION AND CURETTAGE (D&C) /HYSTEROSCOPY;  Surgeon: Jonnie Kind, MD;  Location: [facility];  Service: Gynecology;  Laterality: N/A;  With Suction Curette  . Cataract extraction    . Hiatal hernia repair    . Middle ear surgery    .  Abdominal hysterectomy  09/12/2011    Procedure: HYSTERECTOMY ABDOMINAL;  Surgeon: Janie Morning, MD;  Location: [facility];  Service: Gynecology;  Laterality: N/A;  Total Abdominal Hysterectomy, Bilateral Salpingo Oophorectomy  . Salpingoophorectomy  09/12/2011    Procedure: SALPINGO OOPHERECTOMY;  Surgeon: Janie Morning, MD;  Location: [facility];  Service: Gynecology;  Laterality: Bilateral;  . Colonoscopy  07/05/2012    Procedure: COLONOSCOPY;  Surgeon: Rogene Houston, MD;  Location: [facility];  Service: Endoscopy;  Laterality: N/A;  98    Past Medical Hx:  Past Medical History  Diagnosis Date  . Arthritis   . Asthma   . Hypertension   . GERD (gastroesophageal reflux disease)   . Obesity, morbid (more than 100 lbs over ideal weight or BMI > 40) 08/2011    Ht 5'7", wt 280 lb  . Bronchitis, chronic obstructive since 2000's  . Shortness of breath     unable to lay flat due to dyspnea  . CHF (congestive heart failure)     possible CHF on CXR of 08/15/11  . Hiatal hernia   . Cancer     dx with endometrial cancer   . PONV (postoperative nausea and vomiting)     RESP DIFFICULTY  POST OP  . Hiatal hernia 11/19/2012    Family Hx:  Family History  Problem Relation Age of Onset  . Anesthesia problems Neg Hx   . Hypotension Neg Hx   . Malignant hyperthermia Neg Hx   . Pseudochol deficiency Neg Hx   . Other  Mother   . Cancer Father    Review of Systems Constitutional  Feels well  Cardiovascular  No chest pain, shortness of breath.  Pulmonary  No cough or wheeze.  Gastro Intestinal  No nausea, vomiting, or diarrhea. No bright red blood per rectum,  abdominal wall burning pain left lateral umbilical area without radiation    Genito Urinary  No frequency, urgency, dysuria, no vaginal bleeding or discharge  Neurologic  No weakness, no change in gait Psychology  No depression, anxiety, insomnia.   Vitals:  Blood pressure 134/56, pulse 58, temperature 97.8 F (36.6 C),  temperature source Oral, resp. rate 20, height 5\' 4"  (1.626 m), weight 270 lb (122.471 kg).  Physical Exam: Well-nourished well-developed female flush in the hot room with c/o nausea and dry heaves Chest: CTA Cardiac:  RRR 3/6 holosystolic murmer Back:  No CVAT LN survey:  No cervical supra clavicular or inguinal adenopathy Abdomen: Well-healed surgical midline incision. There is no evidence of an incisional hernia. Abdomen is soft and nontender. No lesions noted on the abdomen.  Port sites without masses. Normal BS Pelvic: Normal external female genitalia. The vagina is atrophic. The vaginal cuff is intact.  No vaginal bleeding or discharge.  No nodularity or cul de sac masses.  Rectal: Fair tone, no masses EXT:  3+ edema bilaterally with cyanosis.  Janie Morning, MD 12/04/2013, 4:57 PM

## 2013-12-04 NOTE — Patient Instructions (Signed)
Plan to follow up with Dr. Glo Herring.  Please call for any questions or concerns.  We will let you know about the results of your pap smear.

## 2013-12-08 ENCOUNTER — Telehealth: Payer: Self-pay | Admitting: Orthopedic Surgery

## 2013-12-08 NOTE — Telephone Encounter (Signed)
Routing to Dr Harrison 

## 2013-12-09 ENCOUNTER — Telehealth: Payer: Self-pay | Admitting: Orthopedic Surgery

## 2013-12-09 ENCOUNTER — Telehealth: Payer: Self-pay | Admitting: *Deleted

## 2013-12-09 NOTE — Telephone Encounter (Signed)
Routing to Dr Harrison 

## 2013-12-09 NOTE — Telephone Encounter (Signed)
Patient called to follow up on note/call yesterday, 12/08/13, requesting either refill on Prednisone, per last office visit 06/23/13, or if another recommendation.  Her last note indicates patient would call back if needed another prescription for "lower dosing" of Prednisone.  Please advise.  If refill approved, pharmacy is [facility], [place].  Patient's ph# is [phone].

## 2013-12-09 NOTE — Telephone Encounter (Signed)
Message copied by Lucile Crater on Tue Dec 09, 2013  3:52 PM ------      Message from: Joylene John D      Created: Tue Dec 09, 2013  3:51 PM       Normal pap       ----- Message -----         From: [facility]         Sent: 12/09/2013   3:24 PM           To: Dorothyann Gibbs, NP                   ------

## 2013-12-09 NOTE — Telephone Encounter (Signed)
Per NP notified pt Pap normal. Pt verbalized understanding.

## 2013-12-10 NOTE — Telephone Encounter (Signed)
Spoke with patient, and advised that a prescription for prednisone 5 mg would be sent in to [facility] per Dr. Aline Brochure.

## 2013-12-10 NOTE — Telephone Encounter (Signed)
yes

## 2013-12-19 ENCOUNTER — Encounter (HOSPITAL_COMMUNITY): Payer: Self-pay

## 2013-12-19 ENCOUNTER — Encounter (HOSPITAL_COMMUNITY): Payer: Medicare Other | Attending: Hematology and Oncology

## 2013-12-19 VITALS — BP 150/79 | HR 61 | Temp 97.6°F | Resp 20 | Ht 64.0 in | Wt 269.7 lb

## 2013-12-19 DIAGNOSIS — I509 Heart failure, unspecified: Secondary | ICD-10-CM | POA: Insufficient documentation

## 2013-12-19 DIAGNOSIS — I10 Essential (primary) hypertension: Secondary | ICD-10-CM | POA: Insufficient documentation

## 2013-12-19 DIAGNOSIS — J4489 Other specified chronic obstructive pulmonary disease: Secondary | ICD-10-CM | POA: Insufficient documentation

## 2013-12-19 DIAGNOSIS — J449 Chronic obstructive pulmonary disease, unspecified: Secondary | ICD-10-CM | POA: Insufficient documentation

## 2013-12-19 DIAGNOSIS — M1990 Unspecified osteoarthritis, unspecified site: Secondary | ICD-10-CM | POA: Insufficient documentation

## 2013-12-19 DIAGNOSIS — Z6841 Body Mass Index (BMI) 40.0 and over, adult: Secondary | ICD-10-CM | POA: Insufficient documentation

## 2013-12-19 DIAGNOSIS — D75839 Thrombocytosis, unspecified: Secondary | ICD-10-CM

## 2013-12-19 DIAGNOSIS — J45909 Unspecified asthma, uncomplicated: Secondary | ICD-10-CM | POA: Insufficient documentation

## 2013-12-19 DIAGNOSIS — Z8542 Personal history of malignant neoplasm of other parts of uterus: Secondary | ICD-10-CM | POA: Insufficient documentation

## 2013-12-19 DIAGNOSIS — M069 Rheumatoid arthritis, unspecified: Secondary | ICD-10-CM | POA: Insufficient documentation

## 2013-12-19 DIAGNOSIS — Z90710 Acquired absence of both cervix and uterus: Secondary | ICD-10-CM | POA: Insufficient documentation

## 2013-12-19 DIAGNOSIS — K219 Gastro-esophageal reflux disease without esophagitis: Secondary | ICD-10-CM | POA: Insufficient documentation

## 2013-12-19 DIAGNOSIS — C541 Malignant neoplasm of endometrium: Secondary | ICD-10-CM

## 2013-12-19 DIAGNOSIS — D473 Essential (hemorrhagic) thrombocythemia: Secondary | ICD-10-CM | POA: Insufficient documentation

## 2013-12-19 DIAGNOSIS — D480 Neoplasm of uncertain behavior of bone and articular cartilage: Secondary | ICD-10-CM

## 2013-12-19 DIAGNOSIS — M109 Gout, unspecified: Secondary | ICD-10-CM | POA: Insufficient documentation

## 2013-12-19 DIAGNOSIS — K449 Diaphragmatic hernia without obstruction or gangrene: Secondary | ICD-10-CM | POA: Insufficient documentation

## 2013-12-19 DIAGNOSIS — C549 Malignant neoplasm of corpus uteri, unspecified: Secondary | ICD-10-CM

## 2013-12-19 LAB — COMPREHENSIVE METABOLIC PANEL
ALT: 15 U/L (ref 0–35)
AST: 24 U/L (ref 0–37)
Albumin: 3.8 g/dL (ref 3.5–5.2)
Alkaline Phosphatase: 96 U/L (ref 39–117)
BILIRUBIN TOTAL: 0.4 mg/dL (ref 0.3–1.2)
BUN: 20 mg/dL (ref 6–23)
CHLORIDE: 98 meq/L (ref 96–112)
CO2: 31 mEq/L (ref 19–32)
Calcium: 9.7 mg/dL (ref 8.4–10.5)
Creatinine, Ser: 0.76 mg/dL (ref 0.50–1.10)
GFR calc Af Amer: >90 mL/min (ref >90)
GFR calc non Af Amer: 81 mL/min — ABNORMAL LOW (ref >90)
Glucose, Bld: 108 mg/dL — ABNORMAL HIGH (ref 70–99)
Potassium: 4 mEq/L (ref 3.7–5.3)
Sodium: 140 mEq/L (ref 137–147)
Total Protein: 8.4 g/dL — ABNORMAL HIGH (ref 6.0–8.3)

## 2013-12-19 LAB — CBC WITH DIFFERENTIAL/PLATELET
BASOS ABS: 0.1 10*3/uL (ref 0.0–0.1)
BASOS PCT: 1 % (ref 0–1)
EOS ABS: 0.5 10*3/uL (ref 0.0–0.7)
Eosinophils Relative: 5 % (ref 0–5)
HCT: 47 % — ABNORMAL HIGH (ref 36.0–46.0)
HEMOGLOBIN: 15.3 g/dL — AB (ref 12.0–15.0)
Lymphocytes Relative: 20 % (ref 12–46)
Lymphs Abs: 2.2 10*3/uL (ref 0.7–4.0)
MCH: 27.7 pg (ref 26.0–34.0)
MCHC: 32.6 g/dL (ref 30.0–36.0)
MCV: 85 fL (ref 78.0–100.0)
MONOS PCT: 6 % (ref 3–12)
Monocytes Absolute: 0.7 10*3/uL (ref 0.1–1.0)
NEUTROS PCT: 69 % (ref 43–77)
Neutro Abs: 7.6 10*3/uL (ref 1.7–7.7)
Platelets: 669 10*3/uL — ABNORMAL HIGH (ref 150–400)
RBC: 5.53 MIL/uL — ABNORMAL HIGH (ref 3.87–5.11)
RDW: 15.2 % (ref 11.5–15.5)
WBC: 11.1 10*3/uL — ABNORMAL HIGH (ref 4.0–10.5)

## 2013-12-19 LAB — RETICULOCYTES
RBC.: 5.53 MIL/uL — AB (ref 3.87–5.11)
Retic Count, Absolute: 77.4 10*3/uL (ref 19.0–186.0)
Retic Ct Pct: 1.4 % (ref 0.4–3.1)

## 2013-12-19 LAB — LACTATE DEHYDROGENASE: LDH: 238 U/L (ref 94–250)

## 2013-12-19 NOTE — Progress Notes (Signed)
[place] A. Barnet Glasgow, M.D.  NEW PATIENT EVALUATION   Name: [name] Date: 12/19/2013 MRN: [id number] DOB: [birth year]  PCP: [name], MD   REFERRING PHYSICIAN: [name], MD  REASON FOR REFERRAL: Thrombocytosis     HISTORY OF PRESENT ILLNESS:[name] is a 73 y.o. female who is referred by her family physician because of recent recognition of thrombocytosis and polycythemia. She denies any history of epistaxis, hemoptysis, melena, hematochezia, hematuria, or vaginal bleeding. She does have chronic lower 70 swelling and suffers from generali joint discomfort without redness or swelling. She did have an exacerbation of severe arthritis in the right wrist diagnosis gouty arthritis by CT scan. She is not on allopurinol. Appetite has been good with no nausea, vomiting, diarrhea, constipation, easy satiety, fever, night sweats, cough, wheezing, PND, orthopnea, palpitations, skin rash, headache, or seizures.zed   PAST MEDICAL HISTORY:  has a past medical history of Arthritis; Asthma; Hypertension; GERD (gastroesophageal reflux disease); Obesity, morbid (more than 100 lbs over ideal weight or BMI > 40) (08/2011); Bronchitis, chronic obstructive (since 2000's); Shortness of breath; CHF (congestive heart failure); Hiatal hernia; Cancer; PONV (postoperative nausea and vomiting); and Hiatal hernia (11/19/2012).   ONCOLOGIC HISTORY: Total abdominal hysterectomy BSO on November 6 year 2012. Pathology revealed a grade 1 endometrioid adenocarcinoma that measured 3.2 cm. The tumor invaded into 0.4 out of 1.5 cm of myometrial thickness for approximately 25%. There was no lymphovascular space involvement. The adnexa were negative. She comes in today for her postoperative check. Based on GOG criteria she did not require any adjuvant therapy.    PAST SURGICAL HISTORY: Past Surgical History  Procedure Laterality Date  . Shoulder surgery     right -removal of bone spur  . Rectocele repair    . Hysteroscopy w/d&c  08/15/2011    Procedure: DILATATION AND CURETTAGE (D&C) /HYSTEROSCOPY;  Surgeon: Jonnie Kind, MD;  Location: [facility];  Service: Gynecology;  Laterality: N/A;  With Suction Curette  . Cataract extraction    . Hiatal hernia repair    . Middle ear surgery    . Abdominal hysterectomy  09/12/2011    Procedure: HYSTERECTOMY ABDOMINAL;  Surgeon: Janie Morning, MD;  Location: [facility];  Service: Gynecology;  Laterality: N/A;  Total Abdominal Hysterectomy, Bilateral Salpingo Oophorectomy  . Salpingoophorectomy  09/12/2011    Procedure: SALPINGO OOPHERECTOMY;  Surgeon: Janie Morning, MD;  Location: [facility];  Service: Gynecology;  Laterality: Bilateral;  . Colonoscopy  07/05/2012    Procedure: COLONOSCOPY;  Surgeon: Rogene Houston, MD;  Location: [facility];  Service: Endoscopy;  Laterality: N/A;  730     CURRENT MEDICATIONS: has a current medication list which includes the following prescription(s): albuterol, aspirin ec, atenolol, budesonide-formoterol, celecoxib, clonidine, diltiazem, docusate sodium, doxylamine (sleep), esomeprazole, furosemide, hydrochlorothiazide, losartan, multivitamin, simethicone, simvastatin, and tramadol-acetaminophen.   ALLERGIES: Carbamazepine   SOCIAL HISTORY:  reports that she has never smoked. She has never used smokeless tobacco. She reports that she does not drink alcohol or use illicit drugs.   FAMILY HISTORY: family history includes Cancer in her father; Other in her mother. There is no history of Anesthesia problems, Hypotension, Malignant hyperthermia, or Pseudochol deficiency.    REVIEW OF SYSTEMS:  Other than that discussed above is noncontributory.    PHYSICAL EXAM:  vitals were not taken for this visit.   GENERAL:alert, no distress and comfortable. Morbidly obese. SKIN: skin color, texture, turgor are normal, no  rashes or significant lesions EYES: normal, Conjunctiva are  pink and non-injected, sclera clear. No plethora. OROPHARYNX:no exudate, no erythema and lips, buccal mucosa, and tongue normal  NECK: supple, thyroid normal size, non-tender, without nodularity CHEST: Normal AP diameter with no breast masses. LYMPH:  no palpable lymphadenopathy in the cervical, axillary or inguinal LUNGS: clear to auscultation and percussion with normal breathing effort HEART: regular rate & rhythm and no murmurs ABDOMEN:abdomen soft, non-tender and normal bowel sounds. Internal organs could not be palpated. MUSCULOSKELETALl:no cyanosis of digits, no clubbing or edema. Bilateral soft tissue swelling in both lower extremities without frank pitting edema or induration. NEURO: alert & oriented x 3 with fluent speech, no focal motor/sensory deficits    LABORATORY DATA:   06/21/2012: WBC 10.3 hemoglobin 16.4 platelets 655,000. 12/03/2013: WBC 8.8, hemoglobin 15.5, platelets 700,000. MCV of 81.2 with RDW of 15.6.  No visits with results within 30 Day(s) from this visit. Latest known visit with results is:  Orders Only on 06/03/2013  Component Date Value Ref Range Status  . Creat 06/16/2013 0.76  0.50 - 1.10 mg/dL Final  . BUN 06/16/2013 18  6 - 23 mg/dL Final    Urinalysis    Component Value Date/Time   COLORURINE YELLOW 08/08/2011 1519   APPEARANCEUR CLEAR 08/08/2011 1519   LABSPEC 1.020 08/08/2011 1519   PHURINE 6.0 08/08/2011 1519   GLUCOSEU NEGATIVE 08/08/2011 1519   HGBUR MODERATE* 08/08/2011 1519   BILIRUBINUR NEGATIVE 08/08/2011 1519   KETONESUR NEGATIVE 08/08/2011 1519   PROTEINUR NEGATIVE 08/08/2011 1519   UROBILINOGEN 0.2 08/08/2011 1519   NITRITE NEGATIVE 08/08/2011 1519   LEUKOCYTESUR MODERATE* 08/08/2011 1519      @RADIOGRAPHY :   DG Wrist Complete Right Status: Final result         PACS Images    Show images for DG Wrist Complete Right         Study Result    Right wrist 3 views  Right wrist pain  Status post fall  Prior to fall patient  having swelling and pain in the right wrist for several months  On the AP view there appears to be a eccentric lesion in the metaphyseal epiphyseal region which is approximately 12 x 8 mm.  Differential diagnosis includes giant cell tumor, benign bone lesion,  Further imaging        CT Wrist Right W Contrast (06/17/2013 ) Status: Final result         PACS Images    Show images for CT Wrist Right W Contrast         Study Result    *RADIOLOGY REPORT*  Clinical Data: Fall 3 weeks ago. Wrist pain.  CT OF THE RIGHT WRIST WITH CONTRAST  Technique: Multidetector CT imaging was performed following the  standard protocol during bolus administration of intravenous  contrast.  Contrast: 73mL OMNIPAQUE IOHEXOL 300 MG/ML SOLN  Comparison: Radiograph 06/03/2013.  Findings: Multiple osseous erosions are present, most compatible  with gout arthritis. Amyloid can be considered in the setting of  chronic renal insufficiency.  The largest erosion is in the distal radius. Small erosion is  present in the radial styloid however in the distal radial  metaphysis along the dorsum, there is a 1 cm erosion with sclerotic  well corticated margins.  Small erosions are also present in the proximal lunate and a large  erosion in the triquetrum. Mild ulnar plus variance. Scapholunate  interval and lunotriquetral interval appear intact. Moderate STT  joint osteoarthritis with severe basal  joint of the thumb  osteoarthritis. There is no enhancing mass lesion however there  does appear to be synovial hypertrophy of the wrist which may be  secondary to gouty arthropathy or degenerative synovitis. The  largest erosion appears associated with invagination and swelling  of the extensor carpi radialis longus on coronal imaging.  There is no fracture in this patient with history of fall.  Moderate carpometacarpal osteoarthritis. Scaphoid bone appears  intact.  IMPRESSION:  Constellation of findings  compatible with gout arthritis of the  wrist. Amyloid should be considered if there is renal  insufficiency. STT joint and basal joint of the thumb  osteoarthritis. 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  Original Report      MM Digital Screening Status: Final result            Study Result    CLINICAL DATA: Screening.  EXAM:  DIGITAL SCREENING BILATERAL MAMMOGRAM WITH CAD  COMPARISON: Previous exam(s).  ACR Breast Density Category b: There are scattered areas of  fibroglandular density.  FINDINGS:  There are no findings suspicious for malignancy. Images were  processed with CAD.  IMPRESSION:  No mammographic evidence of malignancy. A result letter of this  screening mammogram will be mailed directly to the patient.  RECOMMENDATION:  Screening mammogram in one year. (Code:SM-B-01Y)  BI-RADS CATEGORY 1: Negative  Electronically Signed  By: Lovey Newcomer M.D.  On: 08/26/2013 12:56    PATHOLOGY:  FINAL for BRUNILDA, EBLE ([id number]) Patient: ROSCHELLE, CALANDRA Collected: 12/04/2013 Client: [facility] Accession: [id number] Received: 12/05/2013 Melissa D. Elinor Parkinson, NP DOB: 1940/12/04 Age: 21 Gender: F Reported: 12/09/2013 [address] Patient Ph: [phone] MRN#: [id number] [place], [place] [address] Client Acc#: Chart: Phone: [phone] Fax: LMP: Visit#: [id number].[email or web address] CC: Janie Morning, MD GYNECOLOGIC CYTOLOGY REPORT Adequacy Reason Satisfactory for evaluation. Diagnosis NEGATIVE FOR INTRAEPITHELIAL LESIONS OR MALIGNANCY. BENIGN REACTIVE/REPARATIVE CHANGES. Gretel Acre LI MD Pathologist, Electronic Signature (Case signed 12/09/2013) Specimen Clinical Information Hx endo ca Source Vaginal Pap [ThinPrep Imaged] Note: The PAP test is a screening test, not a diagnostic procedure and should not be used as the sole means of detecting cervical cancer. The pap technique in the best of hands is subject to both false negative and false  positive result at low but significant rates as evidenced by published data. This result should be interpreted in the context of this data, plus your patient's history and clinical findings Report signed out from the following location(s) Technical Component performed at [facility], [place], [place] [address]. CLIA #: [id number], Technical Component performed at [place], [place], [place] [address]. CLIA #: [id number], Interpretation performed at [place], [place], [place] [address]. CLIA #: [id number],   IMPRESSION:  #1. Probable myeloproliferative disorder. #2. Gout. #3. History of giant cell tumor found incidentally in the upper extremity, no need to treat. #4. History of endometrial cancer, stage I, well-differentiated, status/ post total abdominal hysterectomy 09/12/2011, stage I, no postop therapy with normal Pap smear in January 2015. #5. Degenerative joint disease    PLAN:  #1. The patient was reassured. #2. She was told to remain on 81 mg aspirin daily. #3. She was told to continue her current medications. #4. Additional abscess was done today to discriminate between primary bone marrow disorder producing thrombocytosis and a reactive process. #5. Followup in 2 weeks with CBC.  I appreciate the opportunity of sharing in her care.  Doroteo Bradford, MD 12/19/2013 3:06 PM

## 2013-12-19 NOTE — Patient Instructions (Addendum)
[facility] Discharge Instructions  RECOMMENDATIONS MADE BY THE CONSULTANT AND ANY TEST RESULTS WILL BE SENT TO YOUR REFERRING PHYSICIAN.  EXAM FINDINGS BY THE PHYSICIAN TODAY AND SIGNS OR SYMPTOMS TO REPORT TO CLINIC OR PRIMARY PHYSICIAN:    Return to see Dr. Barnet Glasgow in 2 weeks    Thank you for choosing [facility] to provide your oncology and hematology care.  To afford each patient quality time with our providers, please arrive at least 15 minutes before your scheduled appointment time.  With your help, our goal is to use those 15 minutes to complete the necessary work-up to ensure our physicians have the information they need to help with your evaluation and healthcare recommendations.    Effective January 1st, 2014, we ask that you re-schedule your appointment with our physicians should you arrive 10 or more minutes late for your appointment.  We strive to give you quality time with our providers, and arriving late affects you and other patients whose appointments are after yours.    Again, thank you for choosing [facility].  Our hope is that these requests will decrease the amount of time that you wait before being seen by our physicians.       _____________________________________________________________  Should you have questions after your visit to [facility], please contact our office at [phone] between the hours of 8:30 a.m. and 5:00 p.m.  Voicemails left after 4:30 p.m. will not be returned until the following business day.  For prescription refill requests, have your pharmacy contact our office with your prescription refill request.

## 2013-12-20 LAB — FERRITIN: Ferritin: 42 ng/mL (ref 10–291)

## 2013-12-20 LAB — CA 125: CA 125: 11.7 U/mL (ref 0.0–30.2)

## 2013-12-22 LAB — P190 BCR-ABL 1: P190 BCR ABL1: NOT DETECTED

## 2013-12-22 LAB — BCR/ABL GENE REARRANGEMENT QNT, PCR
BCR ABL1 / ABL1 IS: 0 %
BCR ABL1 / ABL1: 0 %

## 2013-12-22 LAB — P210 BCR-ABL 1: P210 BCR ABL1: NOT DETECTED

## 2013-12-26 LAB — JAK2 GENOTYPR: JAK2 GenotypR: DETECTED

## 2014-01-02 ENCOUNTER — Ambulatory Visit (HOSPITAL_COMMUNITY): Payer: Medicare Other

## 2014-01-05 ENCOUNTER — Encounter (HOSPITAL_COMMUNITY): Payer: Self-pay

## 2014-01-05 ENCOUNTER — Encounter (HOSPITAL_COMMUNITY): Payer: Medicare Other | Attending: Hematology and Oncology

## 2014-01-05 VITALS — BP 144/54 | HR 66 | Temp 98.0°F | Resp 18 | Wt 271.8 lb

## 2014-01-05 DIAGNOSIS — D471 Chronic myeloproliferative disease: Secondary | ICD-10-CM | POA: Insufficient documentation

## 2014-01-05 DIAGNOSIS — D473 Essential (hemorrhagic) thrombocythemia: Secondary | ICD-10-CM | POA: Insufficient documentation

## 2014-01-05 DIAGNOSIS — D75839 Thrombocytosis, unspecified: Secondary | ICD-10-CM

## 2014-01-05 DIAGNOSIS — C549 Malignant neoplasm of corpus uteri, unspecified: Secondary | ICD-10-CM

## 2014-01-05 DIAGNOSIS — D45 Polycythemia vera: Secondary | ICD-10-CM | POA: Insufficient documentation

## 2014-01-05 DIAGNOSIS — C541 Malignant neoplasm of endometrium: Secondary | ICD-10-CM

## 2014-01-05 DIAGNOSIS — D47Z9 Other specified neoplasms of uncertain behavior of lymphoid, hematopoietic and related tissue: Secondary | ICD-10-CM | POA: Insufficient documentation

## 2014-01-05 HISTORY — DX: Chronic myeloproliferative disease: D47.1

## 2014-01-05 MED ORDER — HYDROXYUREA 500 MG PO CAPS: ORAL_CAPSULE | ORAL | Status: DC

## 2014-01-05 NOTE — Progress Notes (Signed)
Patients progress note faxed to Dr.Fagan's office [phone]), per pt. Request.

## 2014-01-05 NOTE — Progress Notes (Signed)
[place]  OFFICE PROGRESS Lori Miner, MD [address] [place] [address]  DIAGNOSIS: Thrombocytosis - Plan: CBC with Differential  Myeloproliferative disorder, JAK-2 positive  Endometrial cancer  Chief Complaint  Patient presents with  . JAK-2 positive thrombocytosis    CURRENT THERAPY: To discuss management of thrombocytosis and polycythemia recently found to be JAK-2 positive  INTERVAL HISTORY: [name] 73 y.o. female returns for followup and discussion of management plan for JAK-2 positive thrombocytosis and polycythemia. She offers no new complaints and in fact has been using her inhaler less often. She denies any PND, orthopnea, palpitations, headache, pruritus, melena, hematochezia, hematuria, vaginal bleeding, cough, wheezing, skin rash, or seizures.  MEDICAL HISTORY: Past Medical History  Diagnosis Date  . Arthritis   . Asthma   . Hypertension   . GERD (gastroesophageal reflux disease)   . Obesity, morbid (more than 100 lbs over ideal weight or BMI > 40) 08/2011    Ht 5'7", wt 280 lb  . Bronchitis, chronic obstructive since 2000's  . Shortness of breath     unable to lay flat due to dyspnea  . CHF (congestive heart failure)     possible CHF on CXR of 08/15/11  . Hiatal hernia   . PONV (postoperative nausea and vomiting)     RESP DIFFICULTY  POST OP  . Hiatal hernia 11/19/2012  . Cancer     dx with endometrial cancer   . Cataract     bilat cataract extraction 10 years ago  . Heart murmur   . Myeloproliferative disorder, JAK-2 positive 01/05/2014    INTERIM HISTORY: has KNEE, ARTHRITIS, DEGEN./OSTEO; Chronic lung disease; Endometrial cancer; Acute respiratory failure; Bilateral pulmonary infiltrates on chest x-ray; Leukocytosis; Hyperglycemia; GERD (gastroesophageal reflux disease); Hiatal hernia; CMC arthritis, thumb, degenerative; Bone, giant cell tumor; Thrombocytosis; and  Myeloproliferative disorder, JAK-2 positive on her problem list.   ONCOLOGIC HISTORY:  Total abdominal hysterectomy BSO on November 6 year 2012. Pathology revealed a grade 1 endometrioid adenocarcinoma that measured 3.2 cm. The tumor invaded into 0.4 out of 1.5 cm of myometrial thickness for approximately 25%. There was no lymphovascular space involvement. The adnexa were negative. She comes in today for her postoperative check. Based on GOG criteria she did not require any adjuvant therapy  ALLERGIES:  is allergic to carbamazepine.  MEDICATIONS: has a current medication list which includes the following prescription(s): albuterol, aspirin ec, atenolol, budesonide-formoterol, celecoxib, clonidine, diltiazem, docusate sodium, doxylamine (sleep), esomeprazole, furosemide, hydrochlorothiazide, losartan, multivitamin, prednisone, simethicone, simvastatin, and tramadol-acetaminophen.  SURGICAL HISTORY:  Past Surgical History  Procedure Laterality Date  . Shoulder surgery      right -removal of bone spur  . Rectocele repair    . Hysteroscopy w/d&c  08/15/2011    Procedure: DILATATION AND CURETTAGE (D&C) /HYSTEROSCOPY;  Surgeon: Jonnie Kind, MD;  Location: [facility];  Service: Gynecology;  Laterality: N/A;  With Suction Curette  . Cataract extraction    . Hiatal hernia repair    . Middle ear surgery    . Abdominal hysterectomy  09/12/2011    Procedure: HYSTERECTOMY ABDOMINAL;  Surgeon: Janie Morning, MD;  Location: [facility];  Service: Gynecology;  Laterality: N/A;  Total Abdominal Hysterectomy, Bilateral Salpingo Oophorectomy  . Salpingoophorectomy  09/12/2011    Procedure: SALPINGO OOPHERECTOMY;  Surgeon: Janie Morning, MD;  Location: [facility];  Service: Gynecology;  Laterality: Bilateral;  . Colonoscopy  07/05/2012  Procedure: COLONOSCOPY;  Surgeon: Rogene Houston, MD;  Location: [facility];  Service: Endoscopy;  Laterality: N/A;  730    FAMILY HISTORY: family history includes Cancer in her  father; Other in her mother. There is no history of Anesthesia problems, Hypotension, Malignant hyperthermia, or Pseudochol deficiency.  SOCIAL HISTORY:  reports that she has never smoked. She has never used smokeless tobacco. She reports that she does not drink alcohol or use illicit drugs.  REVIEW OF SYSTEMS:  Other than that discussed above is noncontributory.  PHYSICAL EXAMINATION: ECOG PERFORMANCE STATUS: 1 - Symptomatic but completely ambulatory  There were no vitals taken for this visit.  GENERAL:alert, no distress and comfortable. Obese. SKIN: skin color, texture, turgor are normal, no rashes or significant lesions EYES: PERLA; Conjunctiva are pink and non-injected, sclera clear OROPHARYNX:no exudate, no erythema on lips, buccal mucosa, or tongue. NECK: supple, thyroid normal size, non-tender, without nodularity. No masses CHEST: Increased AP diameter with no breast masses. LYMPH:  no palpable lymphadenopathy in the cervical, axillary or inguinal LUNGS: clear to auscultation and percussion with normal breathing effort HEART: regular rate & rhythm and no murmurs. ABDOMEN:abdomen soft, non-tender and normal bowel sounds. Organomegaly appreciated. No free fluid wave or shifting dullness. MUSCULOSKELETAL:no cyanosis of digits and no clubbing. Range of motion normal.  NEURO: alert & oriented x 3 with fluent speech, no focal motor/sensory deficits   LABORATORY DATA: Office Visit on 12/19/2013  Component Date Value Ref Range Status  . WBC 12/19/2013 11.1* 4.0 - 10.5 K/uL Final  . RBC 12/19/2013 5.53* 3.87 - 5.11 MIL/uL Final  . Hemoglobin 12/19/2013 15.3* 12.0 - 15.0 g/dL Final  . HCT 12/19/2013 47.0* 36.0 - 46.0 % Final  . MCV 12/19/2013 85.0  78.0 - 100.0 fL Final  . MCH 12/19/2013 27.7  26.0 - 34.0 pg Final  . MCHC 12/19/2013 32.6  30.0 - 36.0 g/dL Final  . RDW 12/19/2013 15.2  11.5 - 15.5 % Final  . Platelets 12/19/2013 669* 150 - 400 K/uL Final  . Neutrophils Relative %  12/19/2013 69  43 - 77 % Final  . Neutro Abs 12/19/2013 7.6  1.7 - 7.7 K/uL Final  . Lymphocytes Relative 12/19/2013 20  12 - 46 % Final  . Lymphs Abs 12/19/2013 2.2  0.7 - 4.0 K/uL Final  . Monocytes Relative 12/19/2013 6  3 - 12 % Final  . Monocytes Absolute 12/19/2013 0.7  0.1 - 1.0 K/uL Final  . Eosinophils Relative 12/19/2013 5  0 - 5 % Final  . Eosinophils Absolute 12/19/2013 0.5  0.0 - 0.7 K/uL Final  . Basophils Relative 12/19/2013 1  0 - 1 % Final  . Basophils Absolute 12/19/2013 0.1  0.0 - 0.1 K/uL Final  . Retic Ct Pct 12/19/2013 1.4  0.4 - 3.1 % Final  . RBC. 12/19/2013 5.53* 3.87 - 5.11 MIL/uL Final  . Retic Count, Manual 12/19/2013 77.4  19.0 - 186.0 K/uL Final  . Sodium 12/19/2013 140  137 - 147 mEq/L Final  . Potassium 12/19/2013 4.0  3.7 - 5.3 mEq/L Final  . Chloride 12/19/2013 98  96 - 112 mEq/L Final  . CO2 12/19/2013 31  19 - 32 mEq/L Final  . Glucose, Bld 12/19/2013 108* 70 - 99 mg/dL Final  . BUN 12/19/2013 20  6 - 23 mg/dL Final  . Creatinine, Ser 12/19/2013 0.76  0.50 - 1.10 mg/dL Final  . Calcium 12/19/2013 9.7  8.4 - 10.5 mg/dL Final  . Total Protein 12/19/2013 8.4*  6.0 - 8.3 g/dL Final  . Albumin 12/19/2013 3.8  3.5 - 5.2 g/dL Final  . AST 12/19/2013 24  0 - 37 U/L Final  . ALT 12/19/2013 15  0 - 35 U/L Final  . Alkaline Phosphatase 12/19/2013 96  39 - 117 U/L Final  . Total Bilirubin 12/19/2013 0.4  0.3 - 1.2 mg/dL Final  . GFR calc non Af Amer 12/19/2013 81* >90 mL/min Final  . GFR calc Af Amer 12/19/2013 >90  >90 mL/min Final   Comment: (NOTE)                          The eGFR has been calculated using the CKD EPI equation.                          This calculation has not been validated in all clinical situations.                          eGFR's persistently <90 mL/min signify possible Chronic Kidney                          Disease.  Marland Kitchen LDH 12/19/2013 238  94 - 250 U/L Final  . BCR ABL1 / ABL1 12/19/2013 0.000   Final  . BCR ABL1 / ABL1 IS 12/19/2013  0.000   Final  . Interpretation - BCRQ 12/19/2013 REPORT   Final   Comment: (NOTE)                          The P190 and P210 BCR-ABL1 fusion transcripts are NOT                          detected.                          Reverse transcription real-time PCR is performed for                          the P190 and P210 BCR-ABL1 transcripts associated with                          the t(9;22) chromosomal translocation. For P190,                          results are expressed as a percent ratio of BCR-ABL1                          to the ABL1 transcript, and further adjusted to the                          international scale (IS) for P210.                          Assay sensitivity is dependent on RNA quality and                          sample cellularity but is usually at least 4-logs  below BCR-ABL1 baseline transcript levels. Reference                          range is 0.000% BCR-ABL1/ABL1.                          This test was developed and its performance                          characteristics have been determined by [employer], [place], [place].                          Performance characteristics refer to the                          analytical performance of the test.                                                                               Elmyra Ricks C. Christacos, Ph.D., [facility]                          Performed at [facility]  . JAK2 GenotypR 12/19/2013 Detected   Final   Comment: (NOTE)                          Clinical Utility:                          The somatic acquired mutation affecting Janus Tyrosine Kinase 2 (JAK2                          V617F) in exon 14 is associated with myeloproliferative disorders                          (MPD).  JAK2 V617F has been found to be the most common molecular                          abnormality in patients with Polycythemia Vera (PV, >90%) or Essential                           Thombocythemia (ET, 35% - 70%).  The lowest frequency is found in IMF                          patients (chronic Idiopathic Myelofibrosis, 50%).  The presence of the                          JAK2 mutation causes activation of molecular signals that lead to  proliferation of hematopoietic precursors outside of their normal                          pathways including erythropoietin-independent erythroid colony growth                          in most patients with PV and some patients with ET.  The JAK2 mutation                          is considered the main oncogenic event responsible for PV development                          but its precise role in ET and IMF remains questionable and may                          suggest the requirement of other genetic events to induce these                          pathologies.  The absence of JAK2 V617F does not exclude other                          changes, including in the exon 12.                          Test Methodology:                          Patient DNA is assayed using allele specific PCR technology from                          [employer] and is tested using the Roche Light Cycler Real Time PCR                          analyzer. This assay is reported as detected when >5% of cells show                          the presence of the JAK2 V617F mutation.                          This test was performed using a kit that has not been cleared or                          approved by the FDA.  The analytical performance characteristics of                          this test have been determined by [facility].  This                          test may not be used for diagnostic, prognostic or monitoring purposes                          without confirmation by other medically established means.  Performed at [facility]  . Ferritin 12/19/2013 42  10 - 291 ng/mL Final    Performed at [facility]  . CA 125 12/19/2013 11.7  0.0 - 30.2 U/mL Final   Performed at [facility]  . P190 BCR ABL1 12/19/2013 Not Detected   Final   Performed at [facility]  . P210 BCR ABL1 12/19/2013 Not Detected   Final   Performed at [facility]: JAK2 GenotypR JAK2 genotypr       Collected: 12/19/13 1545   Resulting lab: [facility]   Value: Detected   Comment: (NOTE)    Clinical Utility:    The somatic acquired mutation affecting Janus Tyrosine Kinase 2 (JAK2    V617F) in exon 14 is associated with myeloproliferative disorders    (MPD). JAK2 V617F has been found to be the most common molecular    abnormality in patients with Polycythemia Vera (PV, >90%) or Essential    Thombocythemia (ET, 35% - 70%). The lowest frequency is found in IMF    patients (chronic Idiopathic Myelofibrosis, 50%). The presence of the    JAK2 mutation causes activation of molecular signals that lead to    proliferation of hematopoietic precursors outside of their normal    pathways including erythropoietin-independent erythroid colony growth    in most patients with PV and some patients with ET. The JAK2 mutation    is considered the main oncogenic event responsible for PV development    but its precise role in ET and IMF remains questionable and may    suggest the requirement of other genetic events to induce these    pathologies. The absence of JAK2 V617F does not exclude other    changes, including in the exon 12.    Test Methodology:    Patient DNA is assayed using allele specific PCR technology from    [employer] and is tested using the Roche Light Cycler Real Time PCR    analyzer. This assay is reported as detected when >5% of cells show    the presence of the JAK2 V617F mutation.    This test was performed using a kit that has not been cleared or    approved by the FDA. The analytical performance characteristics of    this test have been  determined by [facility]. This    test may not be used for diagnostic, prognostic or monitoring purposes    without confirmation by other medically established means.    Performed at [facility] (Order [id number])         JAK2 genotypr   Status: Final result Visible to patient: This result is viewable by the patient in [place]. Next appt: None Dx: Thrombocytosis           2wk ago    JAK2 GenotypR Detected     Comments: (NOTE) Clinical Utility: The somatic acquired mutation affecting Janus Tyrosine Kinase 2 (JAK2 V617F) in exon 14 is associated with myeloproliferative disorders (MPD). JAK2 V617F has been found to be the most common molecular abnormality in patients with Polycythemia Vera (PV, >90%) or Essential Thombocythemia (ET, 35% - 70%). The lowest frequency is found in IMF patients (chronic Idiopathic Myelofibrosis, 50%). The presence of the JAK2 mutation causes activation of molecular signals that lead to proliferation of hematopoietic precursors  outside of their normal pathways including erythropoietin-independent erythroid colony growth in most patients with PV and some patients with ET. The JAK2 mutation is considered the main oncogenic event responsible for PV development but its precise role in ET and IMF remains questionable and may suggest the requirement of other genetic events to induce these pathologies. The absence of JAK2 V617F does not exclude other changes, including in the exon 12. Test Methodology: Patient DNA is assayed using allele specific PCR technology from [employer] and is tested using the Roche Light Cycler Real Time PCR analyzer. This assay is reported as detected when >5% of cells show the presence of the JAK2 V617F mutation. This test was performed using a kit that has not been cleared or approved by the FDA. The analytical performance characteristics of this test  have been determined by [facility]. This test may not be used for diagnostic, prognostic or monitoring purposes without confirmation by other medically established means. Performed at [facility]   Specimen Collected: 12/19/13 3:45         Urinalysis    Component Value Date/Time   COLORURINE YELLOW 08/08/2011 [place] 08/08/2011 1519   LABSPEC 1.020 08/08/2011 1519   PHURINE 6.0 08/08/2011 1519   GLUCOSEU NEGATIVE 08/08/2011 1519   HGBUR MODERATE* 08/08/2011 1519   BILIRUBINUR NEGATIVE 08/08/2011 1519   KETONESUR NEGATIVE 08/08/2011 1519   PROTEINUR NEGATIVE 08/08/2011 1519   UROBILINOGEN 0.2 08/08/2011 1519   NITRITE NEGATIVE 08/08/2011 1519   LEUKOCYTESUR MODERATE* 08/08/2011 1519    RADIOGRAPHIC STUDIES: No results found.  ASSESSMENT:   #1. JAK-2 positive myeloproliferative disorder manifesting as polycythemia and thrombocytosis. #2. Gout.  #3. History of giant cell tumor found incidentally in the upper extremity, no need to treat.  #4. History of endometrial cancer, stage I, well-differentiated, status/ post total abdominal hysterectomy 09/12/2011, stage I, no postop therapy with normal Pap smear in January 2015.  #5. Degenerative joint disease     PLAN:  #1. Continue aspirin 81 mg daily. #2. Hydrea 500 mg daily. Information was given to the patient about the potential side effects. #3. Followup in 2 weeks with CBC. Patient was told to call should any side effects occur that are troublesome and persistent.   All questions were answered. The patient knows to call the clinic with any problems, questions or concerns. We can certainly see the patient much sooner if necessary.   I spent 25 minutes counseling the patient face to face. The total time spent in the appointment was 30 minutes.    Doroteo Bradford, MD 01/05/2014 3:39 PM

## 2014-01-05 NOTE — Patient Instructions (Addendum)
[facility] Discharge Instructions  RECOMMENDATIONS MADE BY THE CONSULTANT AND ANY TEST RESULTS WILL BE SENT TO YOUR REFERRING PHYSICIAN.   We will see you in 2 weeks, you will have a cbc at that time. I have faxed your progress notes to Dr.Fagan's office. hydroxyurea 500mg  once a day. Thank you for choosing [facility] to provide your oncology and hematology care.  To afford each patient quality time with our providers, please arrive at least 15 minutes before your scheduled appointment time.  With your help, our goal is to use those 15 minutes to complete the necessary work-up to ensure our physicians have the information they need to help with your evaluation and healthcare recommendations.    Effective January 1st, 2014, we ask that you re-schedule your appointment with our physicians should you arrive 10 or more minutes late for your appointment.  We strive to give you quality time with our providers, and arriving late affects you and other patients whose appointments are after yours.    Again, thank you for choosing [facility].  Our hope is that these requests will decrease the amount of time that you wait before being seen by our physicians.       _____________________________________________________________  Should you have questions after your visit to [facility], please contact our office at [phone] between the hours of 8:30 a.m. and 5:00 p.m.  Voicemails left after 4:30 p.m. will not be returned until the following business day.  For prescription refill requests, have your pharmacy contact our office with your prescription refill request.

## 2014-01-18 ENCOUNTER — Encounter (HOSPITAL_COMMUNITY): Payer: Self-pay | Admitting: Oncology

## 2014-01-18 DIAGNOSIS — D45 Polycythemia vera: Secondary | ICD-10-CM

## 2014-01-18 HISTORY — DX: Polycythemia vera: D45

## 2014-01-18 NOTE — Progress Notes (Signed)
[name], MD [address] [place] [address]  Myeloproliferative disorder, JAK-2 positive - Plan: CBC  Polycythemia vera(238.4) - Plan: CBC  Thrombocytosis - Plan: CBC with Differential, CBC  CURRENT THERAPY: Hydrea 500 mg daily beginning on 01/05/2014  INTERVAL HISTORY: [name] 73 y.o. female returns for  regular  visit for followup of Jak2 positive polycythemia vera.  On Hydrea 500 mg daily.   I personally reviewed and went over laboratory results with the patient.  The results are noted within this dictation.  She is tolerating her Hydrea well without any difficulties as I would expect.  I provided her education regarding polycythemia vera and myeloproliferative disorder.  She notes occasional muscle cramping.  I see that she is on HCTZ and I suspect her muscle cramping is secondary to this medication +/- hypokalemia secondary to HCTZ.  She notes that it occurs infrequently and I encouraged her to follow-up with her PCP regarding this symptom.  She may need potassium supplementation daily due to K+ wasting from HCTZ.  Hydrea is not known to cause muscle cramping per up-to-date.  Hematologically, she denies any complaints and ROS questioning is negative.    Past Medical History  Diagnosis Date  . Arthritis   . Asthma   . Hypertension   . GERD (gastroesophageal reflux disease)   . Obesity, morbid (more than 100 lbs over ideal weight or BMI > 40) 08/2011    Ht 5'7", wt 280 lb  . Bronchitis, chronic obstructive since 2000's  . Shortness of breath     unable to lay flat due to dyspnea  . CHF (congestive heart failure)     possible CHF on CXR of 08/15/11  . Hiatal hernia   . PONV (postoperative nausea and vomiting)     RESP DIFFICULTY  POST OP  . Hiatal hernia 11/19/2012  . Cancer     dx with endometrial cancer   . Cataract     bilat cataract extraction 10 years ago  . Heart murmur   . Myeloproliferative disorder, JAK-2 positive  01/05/2014  . Polycythemia vera(238.4) 01/18/2014    has KNEE, ARTHRITIS, DEGEN./OSTEO; Chronic lung disease; Endometrial cancer; Acute respiratory failure; Bilateral pulmonary infiltrates on chest x-ray; Leukocytosis; Hyperglycemia; GERD (gastroesophageal reflux disease); Hiatal hernia; CMC arthritis, thumb, degenerative; Bone, giant cell tumor; Thrombocytosis; Myeloproliferative disorder, JAK-2 positive; and Polycythemia vera(238.4) on her problem list.     is allergic to carbamazepine.  [name] does not currently have medications on file.  Past Surgical History  Procedure Laterality Date  . Shoulder surgery      right -removal of bone spur  . Rectocele repair    . Hysteroscopy w/d&c  08/15/2011    Procedure: DILATATION AND CURETTAGE (D&C) /HYSTEROSCOPY;  Surgeon: Jonnie Kind, MD;  Location: [facility];  Service: Gynecology;  Laterality: N/A;  With Suction Curette  . Cataract extraction    . Hiatal hernia repair    . Middle ear surgery    . Abdominal hysterectomy  09/12/2011    Procedure: HYSTERECTOMY ABDOMINAL;  Surgeon: Janie Morning, MD;  Location: [facility];  Service: Gynecology;  Laterality: N/A;  Total Abdominal Hysterectomy, Bilateral Salpingo Oophorectomy  . Salpingoophorectomy  09/12/2011    Procedure: SALPINGO OOPHERECTOMY;  Surgeon: Janie Morning, MD;  Location: [facility];  Service: Gynecology;  Laterality: Bilateral;  . Colonoscopy  07/05/2012    Procedure: COLONOSCOPY;  Surgeon: Rogene Houston, MD;  Location: [facility];  Service: Endoscopy;  Laterality: N/A;  730    Denies any headaches, dizziness, double vision, fevers, chills, night sweats, nausea, vomiting, diarrhea, constipation, chest pain, heart palpitations, shortness of breath, blood in stool, black tarry stool, urinary pain, urinary burning, urinary frequency, hematuria.   PHYSICAL EXAMINATION  ECOG PERFORMANCE STATUS: 0 - Asymptomatic  Filed Vitals:   01/20/14 1500  BP: 163/91  Pulse: 70  Temp: 98 F (36.7  C)  Resp: 20    GENERAL:alert, no distress, well nourished, well developed, comfortable, cooperative, obese and smiling SKIN: skin color, texture, turgor are normal, no rashes or significant lesions HEAD: Normocephalic, No masses, lesions, tenderness or abnormalities EYES: normal, PERRLA, EOMI, Conjunctiva are pink and non-injected EARS: External ears normal OROPHARYNX:mucous membranes are moist  NECK: supple, trachea midline LYMPH:  not examined BREAST:not examined LUNGS: not examined HEART: not examined ABDOMEN:obese BACK: Back symmetric, no curvature. EXTREMITIES:less then 2 second capillary refill, no skin discoloration, no clubbing, no cyanosis  NEURO: alert & oriented x 3 with fluent speech, no focal motor/sensory deficits, gait normal   LABORATORY DATA: CBC    Component Value Date/Time   WBC 9.8 01/20/2014 1459   RBC 5.48* 01/20/2014 1459   RBC 5.53* 12/19/2013 1545   HGB 15.0 01/20/2014 1459   HCT 45.9 01/20/2014 1459   PLT 569* 01/20/2014 1459   MCV 83.8 01/20/2014 1459   MCH 27.4 01/20/2014 1459   MCHC 32.7 01/20/2014 1459   RDW 15.2 01/20/2014 1459   LYMPHSABS 2.0 01/20/2014 1459   MONOABS 0.5 01/20/2014 1459   EOSABS 0.4 01/20/2014 1459   BASOSABS 0.1 01/20/2014 1459    ASSESSMENT:  1. Jak2 positive polycythemia vera.  On Hydrea 500 mg daily.  2. Gout 3. History of giant cell tumor found incidentally in the upper extremity, no need to treat. 4. History of endometrial cancer, stage I, well-differentiated, status/ post total abdominal hysterectomy 09/12/2011, stage I, no postop therapy with normal Pap smear in January 2015.  5. Degenerative joint disease  Patient Active Problem List   Diagnosis Date Noted  . Polycythemia vera(238.4) 01/18/2014  . Myeloproliferative disorder, JAK-2 positive 01/05/2014  . Thrombocytosis 12/19/2013  . CMC arthritis, thumb, degenerative 06/03/2013  . Bone, giant cell tumor 06/03/2013  . GERD (gastroesophageal reflux disease) 11/19/2012    . Hiatal hernia 11/19/2012  . Acute respiratory failure 09/13/2011  . Bilateral pulmonary infiltrates on chest x-ray 09/13/2011  . Leukocytosis 09/13/2011  . Hyperglycemia 09/13/2011  . Endometrial cancer 09/12/2011  . Chronic lung disease 08/16/2011    Class: Chronic  . KNEE, ARTHRITIS, DEGEN./OSTEO 05/24/2010    PLAN:  1. I personally reviewed and went over laboratory results with the patient.  The results are noted within this dictation. 2. I personally reviewed and went over radiographic studies with the patient.  The results are noted within this dictation.   3. Next screening mammogram is due in Oct 2015. 4. Continue ASA 81 mg daily.  5. Labs in 3 and 6 weeks: CBC  6. Follow-up with PCP as directed. 7. Return in 6 weeks for follow-up.   THERAPY PLAN:  Hematologically, he blood counts are improving.  Due to the slow acting mechanism of Hydrea, I will keep her on the same dose and keep an eye on her lab values.  I would not sacrifice her Hgb level for improved WBC or platelet count if they are within a safe range.  She is tolerating Hydrea well.  All questions were answered. The patient knows to call the clinic with  any problems, questions or concerns. We can certainly see the patient much sooner if necessary.  Patient and plan discussed with Dr. Farrel Gobble and he is in agreement with the aforementioned.   Khai Arrona 01/20/2014

## 2014-01-19 ENCOUNTER — Ambulatory Visit (HOSPITAL_COMMUNITY): Payer: Medicare Other

## 2014-01-20 ENCOUNTER — Encounter (HOSPITAL_COMMUNITY): Payer: Self-pay | Admitting: Oncology

## 2014-01-20 ENCOUNTER — Encounter (HOSPITAL_BASED_OUTPATIENT_CLINIC_OR_DEPARTMENT_OTHER): Payer: Medicare Other | Admitting: Oncology

## 2014-01-20 VITALS — BP 163/91 | HR 70 | Temp 98.0°F | Resp 20 | Wt 273.8 lb

## 2014-01-20 DIAGNOSIS — D75839 Thrombocytosis, unspecified: Secondary | ICD-10-CM

## 2014-01-20 DIAGNOSIS — D471 Chronic myeloproliferative disease: Secondary | ICD-10-CM

## 2014-01-20 DIAGNOSIS — D473 Essential (hemorrhagic) thrombocythemia: Secondary | ICD-10-CM

## 2014-01-20 DIAGNOSIS — D45 Polycythemia vera: Secondary | ICD-10-CM

## 2014-01-20 DIAGNOSIS — Z8542 Personal history of malignant neoplasm of other parts of uterus: Secondary | ICD-10-CM

## 2014-01-20 LAB — CBC WITH DIFFERENTIAL/PLATELET
BASOS ABS: 0.1 10*3/uL (ref 0.0–0.1)
BASOS PCT: 1 % (ref 0–1)
EOS ABS: 0.4 10*3/uL (ref 0.0–0.7)
Eosinophils Relative: 4 % (ref 0–5)
HCT: 45.9 % (ref 36.0–46.0)
HEMOGLOBIN: 15 g/dL (ref 12.0–15.0)
Lymphocytes Relative: 20 % (ref 12–46)
Lymphs Abs: 2 10*3/uL (ref 0.7–4.0)
MCH: 27.4 pg (ref 26.0–34.0)
MCHC: 32.7 g/dL (ref 30.0–36.0)
MCV: 83.8 fL (ref 78.0–100.0)
Monocytes Absolute: 0.5 10*3/uL (ref 0.1–1.0)
Monocytes Relative: 6 % (ref 3–12)
NEUTROS ABS: 6.9 10*3/uL (ref 1.7–7.7)
NEUTROS PCT: 70 % (ref 43–77)
PLATELETS: 569 10*3/uL — AB (ref 150–400)
RBC: 5.48 MIL/uL — ABNORMAL HIGH (ref 3.87–5.11)
RDW: 15.2 % (ref 11.5–15.5)
WBC: 9.8 10*3/uL (ref 4.0–10.5)

## 2014-01-20 NOTE — Patient Instructions (Signed)
[facility] Discharge Instructions  RECOMMENDATIONS MADE BY THE CONSULTANT AND ANY TEST RESULTS WILL BE SENT TO YOUR REFERRING PHYSICIAN.  MEDICATIONS PRESCRIBED:  None  INSTRUCTIONS GIVEN AND DISCUSSED: Continue with Hydrea 500 mg (1 pill) daily  SPECIAL INSTRUCTIONS/FOLLOW-UP: Labs in 3 weeks Labs in 6 weeks Return in 6 weeks for follow-up appointment  Thank you for choosing [facility] to provide your oncology and hematology care.  To afford each patient quality time with our providers, please arrive at least 15 minutes before your scheduled appointment time.  With your help, our goal is to use those 15 minutes to complete the necessary work-up to ensure our physicians have the information they need to help with your evaluation and healthcare recommendations.    Effective January 1st, 2014, we ask that you re-schedule your appointment with our physicians should you arrive 10 or more minutes late for your appointment.  We strive to give you quality time with our providers, and arriving late affects you and other patients whose appointments are after yours.    Again, thank you for choosing [facility].  Our hope is that these requests will decrease the amount of time that you wait before being seen by our physicians.       _____________________________________________________________  Should you have questions after your visit to [facility], please contact our office at [phone] between the hours of 8:30 a.m. and 5:00 p.m.  Voicemails left after 4:30 p.m. will not be returned until the following business day.  For prescription refill requests, have your pharmacy contact our office with your prescription refill request.

## 2014-01-20 NOTE — Progress Notes (Signed)
Labs drawn from the patients left ac. Tolerated well

## 2014-02-10 ENCOUNTER — Other Ambulatory Visit (HOSPITAL_COMMUNITY): Payer: Medicare Other

## 2014-02-10 ENCOUNTER — Encounter (HOSPITAL_COMMUNITY): Payer: Medicare Other | Attending: Hematology and Oncology

## 2014-02-10 DIAGNOSIS — D45 Polycythemia vera: Secondary | ICD-10-CM

## 2014-02-10 DIAGNOSIS — D471 Chronic myeloproliferative disease: Secondary | ICD-10-CM

## 2014-02-10 DIAGNOSIS — D75839 Thrombocytosis, unspecified: Secondary | ICD-10-CM

## 2014-02-10 DIAGNOSIS — D47Z9 Other specified neoplasms of uncertain behavior of lymphoid, hematopoietic and related tissue: Secondary | ICD-10-CM | POA: Insufficient documentation

## 2014-02-10 DIAGNOSIS — D473 Essential (hemorrhagic) thrombocythemia: Secondary | ICD-10-CM | POA: Insufficient documentation

## 2014-02-10 LAB — CBC
HCT: 45 % (ref 36.0–46.0)
HEMOGLOBIN: 14.6 g/dL (ref 12.0–15.0)
MCH: 27.8 pg (ref 26.0–34.0)
MCHC: 32.4 g/dL (ref 30.0–36.0)
MCV: 85.6 fL (ref 78.0–100.0)
PLATELETS: 571 10*3/uL — AB (ref 150–400)
RBC: 5.26 MIL/uL — ABNORMAL HIGH (ref 3.87–5.11)
RDW: 16.9 % — ABNORMAL HIGH (ref 11.5–15.5)
WBC: 8.3 10*3/uL (ref 4.0–10.5)

## 2014-02-10 NOTE — Progress Notes (Signed)
[name] presented for labwork. Labs per MD order drawn via Peripheral Line 23 gauge needle inserted in left anterior forearm.  Good blood return present. Procedure without incident.  Needle removed intact. Patient tolerated procedure well.

## 2014-02-17 ENCOUNTER — Other Ambulatory Visit (HOSPITAL_COMMUNITY): Payer: Medicare Other

## 2014-02-18 ENCOUNTER — Ambulatory Visit (HOSPITAL_COMMUNITY): Payer: Medicare Other | Admitting: Oncology

## 2014-02-27 ENCOUNTER — Emergency Department (HOSPITAL_COMMUNITY): Payer: Medicare Other

## 2014-02-27 ENCOUNTER — Encounter (HOSPITAL_COMMUNITY): Payer: Self-pay | Admitting: Emergency Medicine

## 2014-02-27 ENCOUNTER — Emergency Department (HOSPITAL_COMMUNITY)
Admission: EM | Admit: 2014-02-27 | Discharge: 2014-02-28 | Disposition: A | Discharge status: Home / Self Care | Payer: Medicare Other | Attending: Emergency Medicine | Admitting: Emergency Medicine

## 2014-02-27 DIAGNOSIS — R600 Localized edema: Secondary | ICD-10-CM

## 2014-02-27 DIAGNOSIS — R143 Flatulence: Secondary | ICD-10-CM

## 2014-02-27 DIAGNOSIS — IMO0002 Reserved for concepts with insufficient information to code with codable children: Secondary | ICD-10-CM | POA: Insufficient documentation

## 2014-02-27 DIAGNOSIS — R609 Edema, unspecified: Secondary | ICD-10-CM | POA: Insufficient documentation

## 2014-02-27 DIAGNOSIS — Z79899 Other long term (current) drug therapy: Secondary | ICD-10-CM | POA: Insufficient documentation

## 2014-02-27 DIAGNOSIS — I509 Heart failure, unspecified: Secondary | ICD-10-CM | POA: Insufficient documentation

## 2014-02-27 DIAGNOSIS — J45901 Unspecified asthma with (acute) exacerbation: Secondary | ICD-10-CM

## 2014-02-27 DIAGNOSIS — J441 Chronic obstructive pulmonary disease with (acute) exacerbation: Secondary | ICD-10-CM | POA: Insufficient documentation

## 2014-02-27 DIAGNOSIS — R0609 Other forms of dyspnea: Secondary | ICD-10-CM

## 2014-02-27 DIAGNOSIS — Z87898 Personal history of other specified conditions: Secondary | ICD-10-CM | POA: Insufficient documentation

## 2014-02-27 DIAGNOSIS — R141 Gas pain: Secondary | ICD-10-CM | POA: Insufficient documentation

## 2014-02-27 DIAGNOSIS — R011 Cardiac murmur, unspecified: Secondary | ICD-10-CM | POA: Insufficient documentation

## 2014-02-27 DIAGNOSIS — M109 Gout, unspecified: Secondary | ICD-10-CM | POA: Insufficient documentation

## 2014-02-27 DIAGNOSIS — K219 Gastro-esophageal reflux disease without esophagitis: Secondary | ICD-10-CM | POA: Insufficient documentation

## 2014-02-27 DIAGNOSIS — Z8669 Personal history of other diseases of the nervous system and sense organs: Secondary | ICD-10-CM | POA: Insufficient documentation

## 2014-02-27 DIAGNOSIS — Z7982 Long term (current) use of aspirin: Secondary | ICD-10-CM | POA: Insufficient documentation

## 2014-02-27 DIAGNOSIS — Z8542 Personal history of malignant neoplasm of other parts of uterus: Secondary | ICD-10-CM | POA: Insufficient documentation

## 2014-02-27 DIAGNOSIS — Z791 Long term (current) use of non-steroidal anti-inflammatories (NSAID): Secondary | ICD-10-CM | POA: Insufficient documentation

## 2014-02-27 DIAGNOSIS — R142 Eructation: Secondary | ICD-10-CM

## 2014-02-27 DIAGNOSIS — I10 Essential (primary) hypertension: Secondary | ICD-10-CM | POA: Insufficient documentation

## 2014-02-27 HISTORY — DX: Gout, unspecified: M10.9

## 2014-02-27 LAB — COMPREHENSIVE METABOLIC PANEL
ALBUMIN: 3.8 g/dL (ref 3.5–5.2)
ALK PHOS: 99 U/L (ref 39–117)
ALT: 13 U/L (ref 0–35)
AST: 20 U/L (ref 0–37)
BUN: 17 mg/dL (ref 6–23)
CO2: 32 mEq/L (ref 19–32)
Calcium: 9.7 mg/dL (ref 8.4–10.5)
Chloride: 99 mEq/L (ref 96–112)
Creatinine, Ser: 0.59 mg/dL (ref 0.50–1.10)
GFR calc Af Amer: >90 mL/min (ref >90)
GFR calc non Af Amer: 88 mL/min — ABNORMAL LOW (ref >90)
Glucose, Bld: 96 mg/dL (ref 70–99)
POTASSIUM: 3.4 meq/L — AB (ref 3.7–5.3)
Sodium: 141 mEq/L (ref 137–147)
TOTAL PROTEIN: 7.9 g/dL (ref 6.0–8.3)
Total Bilirubin: 0.5 mg/dL (ref 0.3–1.2)

## 2014-02-27 LAB — CBC WITH DIFFERENTIAL/PLATELET
BASOS PCT: 1 % (ref 0–1)
Basophils Absolute: 0.1 10*3/uL (ref 0.0–0.1)
EOS ABS: 0.4 10*3/uL (ref 0.0–0.7)
Eosinophils Relative: 4 % (ref 0–5)
HCT: 44.7 % (ref 36.0–46.0)
Hemoglobin: 14.9 g/dL (ref 12.0–15.0)
Lymphocytes Relative: 22 % (ref 12–46)
Lymphs Abs: 2.4 10*3/uL (ref 0.7–4.0)
MCH: 28.3 pg (ref 26.0–34.0)
MCHC: 33.3 g/dL (ref 30.0–36.0)
MCV: 85 fL (ref 78.0–100.0)
Monocytes Absolute: 0.6 10*3/uL (ref 0.1–1.0)
Monocytes Relative: 6 % (ref 3–12)
NEUTROS PCT: 67 % (ref 43–77)
Neutro Abs: 7 10*3/uL (ref 1.7–7.7)
Platelets: 491 10*3/uL — ABNORMAL HIGH (ref 150–400)
RBC: 5.26 MIL/uL — ABNORMAL HIGH (ref 3.87–5.11)
RDW: 17.7 % — ABNORMAL HIGH (ref 11.5–15.5)
WBC: 10.5 10*3/uL (ref 4.0–10.5)

## 2014-02-27 LAB — APTT: aPTT: 32 seconds (ref 24–37)

## 2014-02-27 LAB — PRO B NATRIURETIC PEPTIDE: Pro B Natriuretic peptide (BNP): 127.2 pg/mL — ABNORMAL HIGH (ref 0–125)

## 2014-02-27 LAB — PROTIME-INR
INR: 1.07 (ref 0.00–1.49)
Prothrombin Time: 13.7 seconds (ref 11.6–15.2)

## 2014-02-27 LAB — D-DIMER, QUANTITATIVE: D-Dimer, Quant: 0.33 ug/mL-FEU (ref 0.00–0.48)

## 2014-02-27 NOTE — ED Provider Notes (Signed)
CSN: [id number]     Arrival date & time 02/27/14  2001 History  This chart was scribed for Janice Norrie, MD by Zettie Pho, ED Scribe. This patient was seen in room APA01/AP[facility]01 and the patient's care was started at 10:11 PM.    Chief Complaint  Patient presents with  . Leg Swelling   The history is provided by the patient, the spouse and a relative (husband and daughter). No language interpreter was used.   HPI Comments: [name] is a 73 y.o. Female with a history of CHF, arthritis, gout, myeloproliferative disorder, and polycythemia vera who presents to the Emergency Department complaining of swelling to the bilateral lower legs, ankles, and feet that she states has been worsening from her baseline over the past 2 days. She reports associated erythema, described as being burgundy in color, to the bilateral toes with some abdominal distention. Her daughter reports that the patient's symptoms have been gradually improving while in the ED. Patient reports some shortness of breath that she states is normal for her baseline and denies any recent changes. Her husband describes moderate DOE at 15 feet. Her husband reports that the patient has been complaining of some intermittent chest heaviness that is exacerbated with exertion, but patient denies any currently. Patient states that she had a stress test recently, but patient is not followed regularly by a cardiologist. She denies pain to the lower extremities, fever, chest pain, cough. She states that she has not been taking her Lasix regularly, but that she did take it this morning. She denies steroid use currently. Patient also has a history of asthma, HTN, GERD, endometrial cancer. She has an allergy to carbamazepine. Patient does not use oxygen at home. Patient does not smoke.   Patient has recently been treated for polycythemia vera. Her platelet count was almost 800,000. She reports their goal is to get her platelet count into the 400  range.  PCP Dr Willey Blade Pulmonologist Dr Luan Pulling  Past Medical History  Diagnosis Date  . Arthritis   . Asthma   . Hypertension   . GERD (gastroesophageal reflux disease)   . Obesity, morbid (more than 100 lbs over ideal weight or BMI > 40) 08/2011    Ht 5'7", wt 280 lb  . Bronchitis, chronic obstructive since 2000's  . Shortness of breath     unable to lay flat due to dyspnea  . CHF (congestive heart failure)     possible CHF on CXR of 08/15/11  . Hiatal hernia   . PONV (postoperative nausea and vomiting)     RESP DIFFICULTY  POST OP  . Hiatal hernia 11/19/2012  . Cancer     dx with endometrial cancer   . Cataract     bilat cataract extraction 10 years ago  . Heart murmur   . Myeloproliferative disorder, JAK-2 positive 01/05/2014  . Polycythemia vera(238.4) 01/18/2014  . Gout    Past Surgical History  Procedure Laterality Date  . Shoulder surgery      right -removal of bone spur  . Rectocele repair    . Hysteroscopy w/d&c  08/15/2011    Procedure: DILATATION AND CURETTAGE (D&C) /HYSTEROSCOPY;  Surgeon: Jonnie Kind, MD;  Location: [facility];  Service: Gynecology;  Laterality: N/A;  With Suction Curette  . Cataract extraction    . Hiatal hernia repair    . Middle ear surgery    . Abdominal hysterectomy  09/12/2011    Procedure: HYSTERECTOMY ABDOMINAL;  Surgeon: Janie Morning, MD;  Location: [facility];  Service: Gynecology;  Laterality: N/A;  Total Abdominal Hysterectomy, Bilateral Salpingo Oophorectomy  . Salpingoophorectomy  09/12/2011    Procedure: SALPINGO OOPHERECTOMY;  Surgeon: Janie Morning, MD;  Location: [facility];  Service: Gynecology;  Laterality: Bilateral;  . Colonoscopy  07/05/2012    Procedure: COLONOSCOPY;  Surgeon: Rogene Houston, MD;  [facility]: [facility];  Service: Endoscopy;  Laterality: N/A;  730   Family History  Problem Relation Age of Onset  . Anesthesia problems Neg Hx   . Hypotension Neg Hx   . Malignant hyperthermia Neg Hx   . Pseudochol deficiency  Neg Hx   . Other Mother   . Cancer Father    History  Substance Use Topics  . Smoking status: Never Smoker   . Smokeless tobacco: Never Used  . Alcohol Use: No  lives at home  Lives with spouse  OB History   Grav Para Term Preterm Abortions TAB SAB Ect Mult Living   5 5   0     5     Review of Systems  Constitutional: Negative for fever.  Respiratory: Positive for shortness of breath (baseline, unchanged). Negative for cough.   Cardiovascular: Positive for leg swelling. Negative for chest pain.  Gastrointestinal: Positive for abdominal distention.  Musculoskeletal: Positive for joint swelling. Negative for arthralgias.  Skin: Positive for color change.  All other systems reviewed and are negative.   Allergies  Carbamazepine  Home Medications   Prior to Admission medications   Medication Sig Start Date End Date Taking? Authorizing Provider  albuterol (PROVENTIL) (5 MG/ML) 0.5% nebulizer solution Take 2.5 mg by nebulization every 4 (four) hours as needed. For shortness of breath 09/16/11 09/15/12  Frederico Hamman, MD  aspirin EC 81 MG tablet Take 81 mg by mouth at bedtime.     Historical Provider, MD  atenolol (TENORMIN) 50 MG tablet Take 25 mg by mouth 2 (two) times daily.     Historical Provider, MD  budesonide-formoterol (SYMBICORT) 160-4.5 MCG/ACT inhaler Inhale 2 puffs into the lungs 2 (two) times daily as needed. For shortness of breath    Historical Provider, MD  celecoxib (CELEBREX) 200 MG capsule Take 200 mg by mouth daily.     Historical Provider, MD  cloNIDine (CATAPRES) 0.3 MG tablet Take 0.15 mg by mouth 2 (two) times daily.     Historical Provider, MD  diltiazem (CARDIZEM CD) 180 MG 24 hr capsule 180 mg at bedtime.  03/05/13   Historical Provider, MD  docusate sodium (COLACE) 100 MG capsule Take 100 mg by mouth at bedtime.    Historical Provider, MD  doxylamine, Sleep, (UNISOM) 25 MG tablet Take 25 mg by mouth at bedtime.      Historical Provider, MD   esomeprazole (NEXIUM) 40 MG capsule Take 40 mg by mouth daily.     Historical Provider, MD  furosemide (LASIX) 40 MG tablet Take 40 mg by mouth every morning.     Historical Provider, MD  hydrochlorothiazide 25 MG tablet Take 25 mg by mouth every morning.     Historical Provider, MD  hydroxyurea (HYDREA) 500 MG capsule Take 1 capsule daily or as directed with food. 01/05/14   Farrel Gobble, MD  losartan (COZAAR) 100 MG tablet 100 mg daily.  02/25/13   Historical Provider, MD  Multiple Vitamin (MULTIVITAMIN) tablet Take 1 tablet by mouth daily.    Historical Provider, MD  predniSONE (DELTASONE) 5 MG tablet Take 5 mg by mouth daily with breakfast. X 10  days. Started on 12/18/13    Historical Provider, MD  simethicone (MYLICON) 0000000 MG chewable tablet Chew 125 mg by mouth daily.      Historical Provider, MD  simvastatin (ZOCOR) 10 MG tablet TAKE ONE TABLET BY MOUTH EVERY DAY AFTER  DINNER 01/30/13   Larey Seat, MD  traMADol-acetaminophen (ULTRACET) 37.5-325 MG per tablet Take 1 tablet by mouth every 4 (four) hours as needed. 10/08/13   Carole Civil, MD   Triage Vitals: BP 155/80  Pulse 74  Temp(Src) 97.6 F (36.4 C) (Oral)  Resp 24  Ht 5\' 4"  (1.626 m)  SpO2 93%  Vital signs normal    Physical Exam  Nursing note and vitals reviewed. Constitutional: She is oriented to person, place, and time. She appears well-developed and well-nourished.  Non-toxic appearance. She does not appear ill. No distress.  HENT:  Head: Normocephalic and atraumatic.  Right Ear: External ear normal.  Left Ear: External ear normal.  Nose: Nose normal. No mucosal edema or rhinorrhea.  Mouth/Throat: Oropharynx is clear and moist and mucous membranes are normal. No dental abscesses or uvula swelling.  Eyes: Conjunctivae and EOM are normal. Pupils are equal, round, and reactive to light.  Neck: Normal range of motion and full passive range of motion without pain. Neck supple.  Cardiovascular: Normal rate,  regular rhythm and normal heart sounds.  Exam reveals no gallop and no friction rub.   No murmur heard. Pulmonary/Chest: Effort normal and breath sounds normal. No respiratory distress. She has no wheezes. She has no rhonchi. She has no rales. She exhibits no tenderness and no crepitus.  Appears tachypneic.   Abdominal: Soft. Normal appearance and bowel sounds are normal. She exhibits no distension. There is no tenderness. There is no rebound and no guarding.  Musculoskeletal: Normal range of motion. She exhibits edema. She exhibits no tenderness.  Moves all extremities well. Mild pitting edema at the dorsum of the feet. Legs are non-pitting. Some delayed capillary refill of the toes bilaterally.   Neurological: She is alert and oriented to person, place, and time. She has normal strength. No cranial nerve deficit.  Skin: Skin is warm, dry and intact. No rash noted. No erythema. No pallor.  Psychiatric: She has a normal mood and affect. Her speech is normal and behavior is normal. Her mood appears not anxious.    ED Course  Procedures (including critical care time)    DIAGNOSTIC STUDIES: Oxygen Saturation is 93% on room air, adequate by my interpretation.    COORDINATION OF CARE: 10:21 PM- Discussed treatment plan with patient at bedside and patient verbalized agreement.   00:15 went over pt's prior PFT's, stress test, and echo. Discussed need to be reseen by her pulmonologist again for her [place]. Patient's chest x-ray was felt to be due to her having poor inspiratory effort.  Labs Review Results for orders placed during the hospital encounter of 02/27/14  CBC WITH DIFFERENTIAL      Result Value Ref Range   WBC 10.5  4.0 - 10.5 K/uL   RBC 5.26 (*) 3.87 - 5.11 MIL/uL   Hemoglobin 14.9  12.0 - 15.0 g/dL   HCT 44.7  36.0 - 46.0 %   MCV 85.0  78.0 - 100.0 fL   MCH 28.3  26.0 - 34.0 pg   MCHC 33.3  30.0 - 36.0 g/dL   RDW 17.7 (*) 11.5 - 15.5 %   Platelets 491 (*) 150 - 400  K/uL   Neutrophils Relative % 67  43 - 77 %   Neutro Abs 7.0  1.7 - 7.7 K/uL   Lymphocytes Relative 22  12 - 46 %   Lymphs Abs 2.4  0.7 - 4.0 K/uL   Monocytes Relative 6  3 - 12 %   Monocytes Absolute 0.6  0.1 - 1.0 K/uL   Eosinophils Relative 4  0 - 5 %   Eosinophils Absolute 0.4  0.0 - 0.7 K/uL   Basophils Relative 1  0 - 1 %   Basophils Absolute 0.1  0.0 - 0.1 K/uL  COMPREHENSIVE METABOLIC PANEL      Result Value Ref Range   Sodium 141  137 - 147 mEq/L   Potassium 3.4 (*) 3.7 - 5.3 mEq/L   Chloride 99  96 - 112 mEq/L   CO2 32  19 - 32 mEq/L   Glucose, Bld 96  70 - 99 mg/dL   BUN 17  6 - 23 mg/dL   Creatinine, Ser 0.59  0.50 - 1.10 mg/dL   Calcium 9.7  8.4 - 10.5 mg/dL   Total Protein 7.9  6.0 - 8.3 g/dL   Albumin 3.8  3.5 - 5.2 g/dL   AST 20  0 - 37 U/L   ALT 13  0 - 35 U/L   Alkaline Phosphatase 99  39 - 117 U/L   Total Bilirubin 0.5  0.3 - 1.2 mg/dL   GFR calc non Af Amer 88 (*) >90 mL/min   GFR calc Af Amer >90  >90 mL/min  PRO B NATRIURETIC PEPTIDE      Result Value Ref Range   Pro B Natriuretic peptide (BNP) 127.2 (*) 0 - 125 pg/mL  D-DIMER, QUANTITATIVE      Result Value Ref Range   D-Dimer, Quant 0.33  0.00 - 0.48 ug/mL-FEU  APTT      Result Value Ref Range   aPTT 32  24 - 37 seconds  PROTIME-INR      Result Value Ref Range   Prothrombin Time 13.7  11.6 - 15.2 seconds   INR 1.07  0.00 - 1.49   Laboratory interpretation all normal except mild hypokalemia     Imaging Review Dg Chest Port 1 View  02/27/2014   CLINICAL DATA:  Bilateral lower extremity swelling.  EXAM: PORTABLE CHEST - 1 VIEW  COMPARISON:  Chest radiograph performed 11/14/2011  FINDINGS: There is marked elevation of the right hemidiaphragm. Bilateral atelectasis is noted. Vascular congestion and vascular crowding are seen. No definite pleural effusion or pneumothorax is seen.  The cardiomediastinal silhouette is mildly enlarged. No acute osseous abnormalities are seen.  IMPRESSION: Marked  elevation of the right hemidiaphragm. Bilateral atelectasis noted. Vascular congestion and mild cardiomegaly noted. Minimal underlying interstitial edema cannot be excluded, depending on the patient's symptoms.   Electronically Signed   By: Garald Balding M.D.   On: 02/27/2014 23:40     EKG Interpretation None        Date: 02/28/2014  Rate: 65  Rhythm: normal sinus rhythm  QRS Axis: normal  Intervals: normal  ST/T Wave abnormalities: normal  Conduction Disutrbances:LVH  Narrative Interpretation:   Old EKG Reviewed: none available    MDM   Final diagnoses:  Pedal edema  DOE (dyspnea on exertion)    Plan discharge  Rolland Porter, MD, FACEP   I personally performed the services described in this documentation, which was scribed in my presence. The recorded information has been reviewed and considered.  Rolland Porter, MD, FACEP     Daleen Bo  Harmon Dun, MD 02/28/14 [phone]

## 2014-02-27 NOTE — ED Notes (Addendum)
Increased ankle swelling and discoloration x 2 days.  Hurts when she walks.  Being treated for polycythemia vera.

## 2014-02-28 NOTE — Discharge Instructions (Signed)
Your tests tonight are good, your platelet count was 491 thousand, which is good. You do not have evidence of congestive heart failure or a blood clot in your leg. Try to limit your salt, too much salt will make your legs swell. Elevate your legs when sitting to help with the swelling. Consider getting compression stockings to help with the swelling. Have Dr Luan Pulling revisit your shortness of breath.   Return to the ED if you seem worse such as fever, coughing, pain in your legs.    Edema Edema is an abnormal build-up of fluids in tissues. Because this is partly dependent on gravity (water flows to the lowest place), it is more common in the legs and thighs (lower extremities). It is also common in the looser tissues, like around the eyes. Painless swelling of the feet and ankles is common and increases as a person ages. It may affect both legs and may include the calves or even thighs. When squeezed, the fluid may move out of the affected area and may leave a dent for a few moments. CAUSES   Prolonged standing or sitting in one place for extended periods of time. Movement helps pump tissue fluid into the veins, and absence of movement prevents this, resulting in edema.  Varicose veins. The valves in the veins do not work as well as they should. This causes fluid to leak into the tissues.  Fluid and salt overload.  Injury, burn, or surgery to the leg, ankle, or foot, may damage veins and allow fluid to leak out.  Sunburn damages vessels. Leaky vessels allow fluid to go out into the sunburned tissues.  Allergies (from insect bites or stings, medications or chemicals) cause swelling by allowing vessels to become leaky.  Protein in the blood helps keep fluid in your vessels. Low protein, as in malnutrition, allows fluid to leak out.  Hormonal changes, including pregnancy and menstruation, cause fluid retention. This fluid may leak out of vessels and cause edema.  Medications that cause fluid  retention. Examples are sex hormones, blood pressure medications, steroid treatment, or anti-depressants.  Some illnesses cause edema, especially heart failure, kidney disease, or liver disease.  Surgery that cuts veins or lymph nodes, such as surgery done for the heart or for breast cancer, may result in edema. DIAGNOSIS  Your caregiver is usually easily able to determine what is causing your swelling (edema) by simply asking what is wrong (getting a history) and examining you (doing a physical). Sometimes x-rays, EKG (electrocardiogram or heart tracing), and blood work may be done to evaluate for underlying medical illness. TREATMENT  General treatment includes:  Leg elevation (or elevation of the affected body part).  Restriction of fluid intake.  Prevention of fluid overload.  Compression of the affected body part. Compression with elastic bandages or support stockings squeezes the tissues, preventing fluid from entering and forcing it back into the blood vessels.  Diuretics (also called water pills or fluid pills) pull fluid out of your body in the form of increased urination. These are effective in reducing the swelling, but can have side effects and must be used only under your caregiver's supervision. Diuretics are appropriate only for some types of edema. The specific treatment can be directed at any underlying causes discovered. Heart, liver, or kidney disease should be treated appropriately. HOME CARE INSTRUCTIONS   Elevate the legs (or affected body part) above the level of the heart, while lying down.  Avoid sitting or standing still for prolonged periods of  time.  Avoid putting anything directly under the knees when lying down, and do not wear constricting clothing or garters on the upper legs.  Exercising the legs causes the fluid to work back into the veins and lymphatic channels. This may help the swelling go down.  The pressure applied by elastic bandages or support  stockings can help reduce ankle swelling.  A low-salt diet may help reduce fluid retention and decrease the ankle swelling.  Take any medications exactly as prescribed. SEEK MEDICAL CARE IF:  Your edema is not responding to recommended treatments. SEEK IMMEDIATE MEDICAL CARE IF:   You develop shortness of breath or chest pain.  You cannot breathe when you lay down; or if, while lying down, you have to get up and go to the window to get your breath.  You are having increasing swelling without relief from treatment.  You develop a fever over 102 F (38.9 C).  You develop pain or redness in the areas that are swollen.  Tell your caregiver right away if you have gained 03 lb/1.4 kg in 1 day or 05 lb/2.3 kg in a week. MAKE SURE YOU:   Understand these instructions.  Will watch your condition.  Will get help right away if you are not doing well or get worse. Document Released: 10/23/2005 Document Revised: 04/23/2012 Document Reviewed: 06/10/2008 [facility] Patient Information 2014 [place].

## 2014-02-28 NOTE — ED Notes (Signed)
Up to bedside commode, tolerated well. Trial off O2. Maintained sats 94-97% r/a. States she feels better. Dr Tomi Bamberger in to re-eval. Pt for discharge. Encouraged pt to take her daily meds as prescribed, see her PMD for f/u and possible pulmonology consult. Limit salt intake and return for any increased SOB or fever/chills. Pt and family comfortable going home

## 2014-03-03 ENCOUNTER — Other Ambulatory Visit (HOSPITAL_COMMUNITY): Payer: Medicare Other

## 2014-03-03 ENCOUNTER — Encounter (HOSPITAL_COMMUNITY): Payer: Medicare Other

## 2014-03-03 ENCOUNTER — Encounter (HOSPITAL_COMMUNITY): Payer: Self-pay

## 2014-03-03 ENCOUNTER — Ambulatory Visit (HOSPITAL_COMMUNITY): Payer: Medicare Other | Admitting: Oncology

## 2014-03-03 ENCOUNTER — Encounter (HOSPITAL_BASED_OUTPATIENT_CLINIC_OR_DEPARTMENT_OTHER): Payer: Medicare Other

## 2014-03-03 VITALS — BP 132/46 | HR 68 | Temp 98.1°F | Resp 20 | Wt 272.5 lb

## 2014-03-03 DIAGNOSIS — D75839 Thrombocytosis, unspecified: Secondary | ICD-10-CM

## 2014-03-03 DIAGNOSIS — D473 Essential (hemorrhagic) thrombocythemia: Secondary | ICD-10-CM

## 2014-03-03 DIAGNOSIS — M1990 Unspecified osteoarthritis, unspecified site: Secondary | ICD-10-CM

## 2014-03-03 DIAGNOSIS — D471 Chronic myeloproliferative disease: Secondary | ICD-10-CM

## 2014-03-03 DIAGNOSIS — D4989 Neoplasm of unspecified behavior of other specified sites: Secondary | ICD-10-CM

## 2014-03-03 DIAGNOSIS — Z8542 Personal history of malignant neoplasm of other parts of uterus: Secondary | ICD-10-CM

## 2014-03-03 DIAGNOSIS — D45 Polycythemia vera: Secondary | ICD-10-CM

## 2014-03-03 MED ORDER — HYDROXYUREA 500 MG PO CAPS: ORAL_CAPSULE | ORAL | Status: DC

## 2014-03-03 NOTE — Patient Instructions (Signed)
[place] Discharge Instructions  RECOMMENDATIONS MADE BY THE CONSULTANT AND ANY TEST RESULTS WILL BE SENT TO YOUR REFERRING PHYSICIAN.  EXAM FINDINGS BY THE PHYSICIAN TODAY AND SIGNS OR SYMPTOMS TO REPORT TO CLINIC OR PRIMARY PHYSICIAN: Exam and findings as discussed by Dr. Barnet Glasgow.  Report fevers, uncontrolled nausea or vomiting, stroke-like symptoms, etc.  MEDICATIONS PRESCRIBED:  Increase your Hydrea to 1000mg  alternating with 500mg  Increase your lasix to 80 mg for 4 days  INSTRUCTIONS/FOLLOW-UP: Follow-up in 4 weeks with labs and MD visit.  Thank you for choosing [facility] to provide your oncology and hematology care.  To afford each patient quality time with our providers, please arrive at least 15 minutes before your scheduled appointment time.  With your help, our goal is to use those 15 minutes to complete the necessary work-up to ensure our physicians have the information they need to help with your evaluation and healthcare recommendations.    Effective January 1st, 2014, we ask that you re-schedule your appointment with our physicians should you arrive 10 or more minutes late for your appointment.  We strive to give you quality time with our providers, and arriving late affects you and other patients whose appointments are after yours.    Again, thank you for choosing [facility].  Our hope is that these requests will decrease the amount of time that you wait before being seen by our physicians.       _____________________________________________________________  Should you have questions after your visit to [facility], please contact our office at [phone] between the hours of 8:30 a.m. and 5:00 p.m.  Voicemails left after 4:30 p.m. will not be returned until the following business day.  For prescription refill requests, have your pharmacy contact our office with your prescription refill request.

## 2014-03-03 NOTE — Progress Notes (Signed)
[place]  OFFICE PROGRESS Lori Miner, MD [address] [place] [address]  DIAGNOSIS: Myeloproliferative disorder, JAK-2 positive  Thrombocytosis  Chief Complaint  Patient presents with  . Thrombocytosis, JAK-2 positive    CURRENT THERAPY: Hydrea 500 mg  beginning on 01/05/2014  INTERVAL HISTORY: [name] 73 y.o. female returns for followup while on Hydrea therapy for myeloproliferative disorder manifesting as thrombocytosis. Disorder is JAK-2 positive. She visited the emergency room on 02/27/2014 because of bluish discoloration both lower extremities associated with increased swelling. She was evaluated and found not to have evidence of a blood clot. She was told to see her family doctor and no changes in her medication were implemented. The bluish discoloration is not as prominent at this time. She denies any chest pain, PND, orthopnea, palpitations, and continues on Celebrex as well as losartan. She denies any melena, hematochezia, hematuria, vaginal bleeding, skin rash, headache, or seizures.  MEDICAL HISTORY: Past Medical History  Diagnosis Date  . Arthritis   . Asthma   . Hypertension   . GERD (gastroesophageal reflux disease)   . Obesity, morbid (more than 100 lbs over ideal weight or BMI > 40) 08/2011    Ht 5'7", wt 280 lb  . Bronchitis, chronic obstructive since 2000's  . Shortness of breath     unable to lay flat due to dyspnea  . CHF (congestive heart failure)     possible CHF on CXR of 08/15/11  . Hiatal hernia   . PONV (postoperative nausea and vomiting)     RESP DIFFICULTY  POST OP  . Hiatal hernia 11/19/2012  . Cancer     dx with endometrial cancer   . Cataract     bilat cataract extraction 10 years ago  . Heart murmur   . Myeloproliferative disorder, JAK-2 positive 01/05/2014  . Polycythemia vera(238.4) 01/18/2014  . Gout     INTERIM HISTORY: has KNEE, ARTHRITIS, DEGEN./OSTEO;  Chronic lung disease; Endometrial cancer; Acute respiratory failure; Bilateral pulmonary infiltrates on chest x-ray; Leukocytosis; Hyperglycemia; GERD (gastroesophageal reflux disease); Hiatal hernia; CMC arthritis, thumb, degenerative; Bone, giant cell tumor; Thrombocytosis; Myeloproliferative disorder, JAK-2 positive; and Polycythemia vera(238.4) on her problem list.    ALLERGIES:  is allergic to carbamazepine.  MEDICATIONS: has a current medication list which includes the following prescription(s): aspirin ec, atenolol, budesonide-formoterol, celecoxib, clonidine, diltiazem, docusate sodium, doxylamine (sleep), esomeprazole, furosemide, hydrochlorothiazide, hydroxyurea, losartan, multivitamin, prochlorperazine, simethicone, simvastatin, tramadol-acetaminophen, and albuterol.  SURGICAL HISTORY:  Past Surgical History  Procedure Laterality Date  . Shoulder surgery      right -removal of bone spur  . Rectocele repair    . Hysteroscopy w/d&c  08/15/2011    Procedure: DILATATION AND CURETTAGE (D&C) /HYSTEROSCOPY;  Surgeon: Jonnie Kind, MD;  Location: [facility];  Service: Gynecology;  Laterality: N/A;  With Suction Curette  . Cataract extraction    . Hiatal hernia repair    . Middle ear surgery    . Abdominal hysterectomy  09/12/2011    Procedure: HYSTERECTOMY ABDOMINAL;  Surgeon: Janie Morning, MD;  Location: [facility];  Service: Gynecology;  Laterality: N/A;  Total Abdominal Hysterectomy, Bilateral Salpingo Oophorectomy  . Salpingoophorectomy  09/12/2011    Procedure: SALPINGO OOPHERECTOMY;  Surgeon: Janie Morning, MD;  Location: [facility];  Service: Gynecology;  Laterality: Bilateral;  . Colonoscopy  07/05/2012    Procedure: COLONOSCOPY;  Surgeon: Rogene Houston, MD;  Location: [facility] SUITE;  Service: Endoscopy;  Laterality: N/A;  730    FAMILY HISTORY: family history includes Cancer in her father; Other in her mother. There is no history of Anesthesia problems, Hypotension, Malignant  hyperthermia, or Pseudochol deficiency.  SOCIAL HISTORY:  reports that she has never smoked. She has never used smokeless tobacco. She reports that she does not drink alcohol or use illicit drugs.  REVIEW OF SYSTEMS:  Other than that discussed above is noncontributory.  PHYSICAL EXAMINATION: ECOG PERFORMANCE STATUS: 1 - Symptomatic but completely ambulatory  Blood pressure 132/46, pulse 68, temperature 98.1 F (36.7 C), temperature source Oral, resp. rate 20, weight 272 lb 8 oz (123.605 kg), SpO2 95.00%.  GENERAL:alert, no distress and comfortable SKIN: skin color, texture, turgor are normal, no rashes or significant lesions EYES: PERLA; Conjunctiva are pink and non-injected, sclera clear SINUSES: No redness or tenderness over maxillary or ethmoid sinuses OROPHARYNX:no exudate, no erythema on lips, buccal mucosa, or tongue. NECK: supple, thyroid normal size, non-tender, without nodularity. No masses CHEST: Normal AP diameter with no breast masses. LYMPH:  no palpable lymphadenopathy in the cervical, axillary or inguinal LUNGS: clear to auscultation and percussion with normal breathing effort HEART: regular rate & rhythm and no murmurs. ABDOMEN:abdomen soft, non-tender and normal bowel sounds. Obese and soft without palpable spleen. MUSCULOSKELETAL:no cyanosis of digits and no clubbing. Range of motion normal. Bilateral lower 70 edema that is pitting insensitive with superficial varicosities but no discoloration of the digits. NEURO: alert & oriented x 3 with fluent speech, no focal motor/sensory deficits   LABORATORY DATA: Admission on 02/27/2014, Discharged on 02/28/2014  Component Date Value Ref Range Status  . WBC 02/27/2014 10.5  4.0 - 10.5 K/uL Final  . RBC 02/27/2014 5.26* 3.87 - 5.11 MIL/uL Final  . Hemoglobin 02/27/2014 14.9  12.0 - 15.0 g/dL Final  . HCT 02/27/2014 44.7  36.0 - 46.0 % Final  . MCV 02/27/2014 85.0  78.0 - 100.0 fL Final  . MCH 02/27/2014 28.3  26.0 - 34.0  pg Final  . MCHC 02/27/2014 33.3  30.0 - 36.0 g/dL Final  . RDW 02/27/2014 17.7* 11.5 - 15.5 % Final  . Platelets 02/27/2014 491* 150 - 400 K/uL Final  . Neutrophils Relative % 02/27/2014 67  43 - 77 % Final  . Neutro Abs 02/27/2014 7.0  1.7 - 7.7 K/uL Final  . Lymphocytes Relative 02/27/2014 22  12 - 46 % Final  . Lymphs Abs 02/27/2014 2.4  0.7 - 4.0 K/uL Final  . Monocytes Relative 02/27/2014 6  3 - 12 % Final  . Monocytes Absolute 02/27/2014 0.6  0.1 - 1.0 K/uL Final  . Eosinophils Relative 02/27/2014 4  0 - 5 % Final  . Eosinophils Absolute 02/27/2014 0.4  0.0 - 0.7 K/uL Final  . Basophils Relative 02/27/2014 1  0 - 1 % Final  . Basophils Absolute 02/27/2014 0.1  0.0 - 0.1 K/uL Final  . Sodium 02/27/2014 141  137 - 147 mEq/L Final  . Potassium 02/27/2014 3.4* 3.7 - 5.3 mEq/L Final  . Chloride 02/27/2014 99  96 - 112 mEq/L Final  . CO2 02/27/2014 32  19 - 32 mEq/L Final  . Glucose, Bld 02/27/2014 96  70 - 99 mg/dL Final  . BUN 02/27/2014 17  6 - 23 mg/dL Final  . Creatinine, Ser 02/27/2014 0.59  0.50 - 1.10 mg/dL Final  . Calcium 02/27/2014 9.7  8.4 - 10.5 mg/dL Final  . Total Protein 02/27/2014 7.9  6.0 - 8.3 g/dL Final  .  Albumin 02/27/2014 3.8  3.5 - 5.2 g/dL Final  . AST 02/27/2014 20  0 - 37 U/L Final  . ALT 02/27/2014 13  0 - 35 U/L Final  . Alkaline Phosphatase 02/27/2014 99  39 - 117 U/L Final  . Total Bilirubin 02/27/2014 0.5  0.3 - 1.2 mg/dL Final  . GFR calc non Af Amer 02/27/2014 88* >90 mL/min Final  . GFR calc Af Amer 02/27/2014 >90  >90 mL/min Final   Comment: (NOTE)                          The eGFR has been calculated using the CKD EPI equation.                          This calculation has not been validated in all clinical situations.                          eGFR's persistently <90 mL/min signify possible Chronic Kidney                          Disease.  . Pro B Natriuretic peptide (BNP) 02/27/2014 127.2* 0 - 125 pg/mL Final  . D-Dimer, Quant 02/27/2014 0.33   0.00 - 0.48 ug/mL-FEU Final   Comment:                                 AT THE INHOUSE ESTABLISHED CUTOFF                          VALUE OF 0.48 ug/mL FEU,                          THIS ASSAY HAS BEEN DOCUMENTED                          IN THE LITERATURE TO HAVE                          A SENSITIVITY AND NEGATIVE                          PREDICTIVE VALUE OF AT LEAST                          98 TO 99%.  THE TEST RESULT                          SHOULD BE CORRELATED WITH                          AN ASSESSMENT OF THE CLINICAL                          PROBABILITY OF DVT / VTE.  Marland Kitchen aPTT 02/27/2014 32  24 - 37 seconds Final  . Prothrombin Time 02/27/2014 13.7  11.6 - 15.2 seconds Final  . INR 02/27/2014 1.07  0.00 - 1.49 Final  Infusion on 02/10/2014  Component Date Value Ref Range Status  . WBC 02/10/2014 8.3  4.0 -  10.5 K/uL Final  . RBC 02/10/2014 5.26* 3.87 - 5.11 MIL/uL Final  . Hemoglobin 02/10/2014 14.6  12.0 - 15.0 g/dL Final  . HCT 02/10/2014 45.0  36.0 - 46.0 % Final  . MCV 02/10/2014 85.6  78.0 - 100.0 fL Final  . MCH 02/10/2014 27.8  26.0 - 34.0 pg Final  . MCHC 02/10/2014 32.4  30.0 - 36.0 g/dL Final  . RDW 02/10/2014 16.9* 11.5 - 15.5 % Final  . Platelets 02/10/2014 571* 150 - 400 K/uL Final    PATHOLOGY: JAK-2 mutation positive  Urinalysis    Component Value Date/Time   COLORURINE YELLOW 08/08/2011 1519   APPEARANCEUR CLEAR 08/08/2011 1519   LABSPEC 1.020 08/08/2011 1519   PHURINE 6.0 08/08/2011 1519   GLUCOSEU NEGATIVE 08/08/2011 1519   HGBUR MODERATE* 08/08/2011 1519   BILIRUBINUR NEGATIVE 08/08/2011 1519   KETONESUR NEGATIVE 08/08/2011 1519   PROTEINUR NEGATIVE 08/08/2011 1519   UROBILINOGEN 0.2 08/08/2011 1519   NITRITE NEGATIVE 08/08/2011 1519   LEUKOCYTESUR MODERATE* 08/08/2011 1519    RADIOGRAPHIC STUDIES: Dg Chest Port 1 View  02/27/2014   CLINICAL DATA:  Bilateral lower extremity swelling.  EXAM: PORTABLE CHEST - 1 VIEW  COMPARISON:  Chest radiograph performed  11/14/2011  FINDINGS: There is marked elevation of the right hemidiaphragm. Bilateral atelectasis is noted. Vascular congestion and vascular crowding are seen. No definite pleural effusion or pneumothorax is seen.  The cardiomediastinal silhouette is mildly enlarged. No acute osseous abnormalities are seen.  IMPRESSION: Marked elevation of the right hemidiaphragm. Bilateral atelectasis noted. Vascular congestion and mild cardiomegaly noted. Minimal underlying interstitial edema cannot be excluded, depending on the patient's symptoms.   Electronically Signed   By: Garald Balding M.D.   On: 02/27/2014 23:40    ASSESSMENT:  #1. JAK-2 positive thrombocytosis, not yet ideally controlled. #2. Gout, quiescent.. #3. Giant cell tumor found incidentally in the upper extremity, no need for therapy. #4. History of endometrial cancer, stage I, well-differentiated, status post TAH 09/12/2011, no postop therapy, normal Pap smear in January 2015 #5. Degenerative joint disease, on Celebrex.   PLAN:  #1. Patient was told to increase Hydrea to 1000 ALTERNATING with 500 mg daily. #2. She was told to increase  furosemide to 80 mg daily for 4 days and weigh herself each day. When she has lost about 4-5 pounds, she was told to resume 40 mg daily furosemide. #3. Continue aspirin. #4. Followup in 4 weeks with CBC.   All questions were answered. The patient knows to call the clinic with any problems, questions or concerns. We can certainly see the patient much sooner if necessary.   I spent 25 minutes counseling the patient face to face. The total time spent in the appointment was 30 minutes.    Farrel Gobble, MD 03/03/2014 4:35 PM  DISCLAIMER:  This note was dictated with voice recognition software.  Similar sounding words can inadvertently be transcribed inaccurately and may not be corrected upon review.

## 2014-03-12 ENCOUNTER — Other Ambulatory Visit (HOSPITAL_COMMUNITY): Payer: Self-pay

## 2014-03-12 DIAGNOSIS — C541 Malignant neoplasm of endometrium: Secondary | ICD-10-CM

## 2014-03-12 DIAGNOSIS — D45 Polycythemia vera: Secondary | ICD-10-CM

## 2014-03-31 ENCOUNTER — Ambulatory Visit (HOSPITAL_COMMUNITY): Payer: Medicare Other

## 2014-03-31 ENCOUNTER — Other Ambulatory Visit (HOSPITAL_COMMUNITY): Payer: Medicare Other

## 2014-04-01 ENCOUNTER — Encounter (HOSPITAL_COMMUNITY): Payer: Self-pay

## 2014-04-01 ENCOUNTER — Encounter (HOSPITAL_BASED_OUTPATIENT_CLINIC_OR_DEPARTMENT_OTHER): Payer: Medicare Other

## 2014-04-01 ENCOUNTER — Encounter (HOSPITAL_COMMUNITY): Payer: Medicare Other | Attending: Hematology and Oncology

## 2014-04-01 VITALS — BP 115/71 | HR 60 | Temp 97.8°F | Resp 18 | Wt 277.4 lb

## 2014-04-01 DIAGNOSIS — C541 Malignant neoplasm of endometrium: Secondary | ICD-10-CM

## 2014-04-01 DIAGNOSIS — D45 Polycythemia vera: Secondary | ICD-10-CM

## 2014-04-01 DIAGNOSIS — D480 Neoplasm of uncertain behavior of bone and articular cartilage: Secondary | ICD-10-CM

## 2014-04-01 DIAGNOSIS — D473 Essential (hemorrhagic) thrombocythemia: Secondary | ICD-10-CM

## 2014-04-01 DIAGNOSIS — D471 Chronic myeloproliferative disease: Secondary | ICD-10-CM

## 2014-04-01 DIAGNOSIS — Z8542 Personal history of malignant neoplasm of other parts of uterus: Secondary | ICD-10-CM

## 2014-04-01 DIAGNOSIS — Z8544 Personal history of malignant neoplasm of other female genital organs: Secondary | ICD-10-CM

## 2014-04-01 DIAGNOSIS — C549 Malignant neoplasm of corpus uteri, unspecified: Secondary | ICD-10-CM | POA: Insufficient documentation

## 2014-04-01 DIAGNOSIS — D47Z9 Other specified neoplasms of uncertain behavior of lymphoid, hematopoietic and related tissue: Secondary | ICD-10-CM

## 2014-04-01 DIAGNOSIS — D75839 Thrombocytosis, unspecified: Secondary | ICD-10-CM

## 2014-04-01 LAB — CBC WITH DIFFERENTIAL/PLATELET
BASOS PCT: 1 % (ref 0–1)
Basophils Absolute: 0.1 10*3/uL (ref 0.0–0.1)
Eosinophils Absolute: 0.4 10*3/uL (ref 0.0–0.7)
Eosinophils Relative: 4 % (ref 0–5)
HCT: 43.4 % (ref 36.0–46.0)
HEMOGLOBIN: 14.2 g/dL (ref 12.0–15.0)
Lymphocytes Relative: 23 % (ref 12–46)
Lymphs Abs: 2 10*3/uL (ref 0.7–4.0)
MCH: 28.9 pg (ref 26.0–34.0)
MCHC: 32.7 g/dL (ref 30.0–36.0)
MCV: 88.4 fL (ref 78.0–100.0)
MONOS PCT: 8 % (ref 3–12)
Monocytes Absolute: 0.7 10*3/uL (ref 0.1–1.0)
NEUTROS ABS: 5.8 10*3/uL (ref 1.7–7.7)
NEUTROS PCT: 64 % (ref 43–77)
Platelets: 459 10*3/uL — ABNORMAL HIGH (ref 150–400)
RBC: 4.91 MIL/uL (ref 3.87–5.11)
RDW: 18.9 % — ABNORMAL HIGH (ref 11.5–15.5)
WBC: 8.9 10*3/uL (ref 4.0–10.5)

## 2014-04-01 NOTE — Progress Notes (Signed)
[name] presented for labwork. Labs per MD order drawn via Peripheral Line 23 gauge needle inserted in left AC  Good blood return present. Procedure without incident.  Needle removed intact. Patient tolerated procedure well.

## 2014-04-01 NOTE — Patient Instructions (Signed)
[place] Discharge Instructions  RECOMMENDATIONS MADE BY THE CONSULTANT AND ANY TEST RESULTS WILL BE SENT TO YOUR REFERRING PHYSICIAN.  EXAM FINDINGS BY THE PHYSICIAN TODAY AND SIGNS OR SYMPTOMS TO REPORT TO CLINIC OR PRIMARY PHYSICIAN: Exam and findings as discussed by Dr. Barnet Glasgow.  If any problems with blood work we will let you know.  MEDICATIONS PRESCRIBED:  Continue hydrea  INSTRUCTIONS/FOLLOW-UP: Follow-up in 4 weeks.  Thank you for choosing [facility] to provide your oncology and hematology care.  To afford each patient quality time with our providers, please arrive at least 15 minutes before your scheduled appointment time.  With your help, our goal is to use those 15 minutes to complete the necessary work-up to ensure our physicians have the information they need to help with your evaluation and healthcare recommendations.    Effective January 1st, 2014, we ask that you re-schedule your appointment with our physicians should you arrive 10 or more minutes late for your appointment.  We strive to give you quality time with our providers, and arriving late affects you and other patients whose appointments are after yours.    Again, thank you for choosing [facility].  Our hope is that these requests will decrease the amount of time that you wait before being seen by our physicians.       _____________________________________________________________  Should you have questions after your visit to [facility], please contact our office at [phone] between the hours of 8:30 a.m. and 5:00 p.m.  Voicemails left after 4:30 p.m. will not be returned until the following business day.  For prescription refill requests, have your pharmacy contact our office with your prescription refill request.

## 2014-04-01 NOTE — Progress Notes (Signed)
[facility]  OFFICE PROGRESS Lori Miner, MD [address] [place] [address]  DIAGNOSIS: Myeloproliferative disorder  Thrombocytosis  Bone, giant cell tumor  Chief Complaint  Patient presents with  . Thrombocytosis, JAK-2 positive    CURRENT THERAPY: Hydrea 500 mg alternating with 1000 mg daily.  INTERVAL HISTORY: [name] 73 y.o. female returns for followup while taking Hydrea for JAK-2 positive myeloproliferative disorder manifesting as thrombocytosis.  She denies any nausea, vomiting, diarrhea, constipation, melena, hematochezia, pruritus, easy satiety, but with occasional cough and postnasal drip. She denies any skin rash and does notice some discoloration of her feet at the end of the day. She denies any PND, orthopnea, or palpitations.  MEDICAL HISTORY: Past Medical History  Diagnosis Date  . Arthritis   . Asthma   . Hypertension   . GERD (gastroesophageal reflux disease)   . Obesity, morbid (more than 100 lbs over ideal weight or BMI > 40) 08/2011    Ht 5'7", wt 280 lb  . Bronchitis, chronic obstructive since 2000's  . Shortness of breath     unable to lay flat due to dyspnea  . CHF (congestive heart failure)     possible CHF on CXR of 08/15/11  . Hiatal hernia   . PONV (postoperative nausea and vomiting)     RESP DIFFICULTY  POST OP  . Hiatal hernia 11/19/2012  . Cancer     dx with endometrial cancer   . Cataract     bilat cataract extraction 10 years ago  . Heart murmur   . Myeloproliferative disorder, JAK-2 positive 01/05/2014  . Polycythemia vera(238.4) 01/18/2014  . Gout     INTERIM HISTORY: has KNEE, ARTHRITIS, DEGEN./OSTEO; Chronic lung disease; Endometrial cancer; Acute respiratory failure; Bilateral pulmonary infiltrates on chest x-ray; Leukocytosis; Hyperglycemia; GERD (gastroesophageal reflux disease); Hiatal hernia; CMC arthritis, thumb, degenerative; Bone, giant cell  tumor; Thrombocytosis; Myeloproliferative disorder, JAK-2 positive; and Polycythemia vera(238.4) on her problem list.    ALLERGIES:  is allergic to carbamazepine.  MEDICATIONS: has a current medication list which includes the following prescription(s): aspirin ec, atenolol, budesonide-formoterol, celecoxib, clonidine, diltiazem, docusate sodium, doxylamine (sleep), esomeprazole, furosemide, hydrochlorothiazide, hydroxyurea, losartan, multivitamin, prochlorperazine, simethicone, simvastatin, tramadol-acetaminophen, and albuterol.  SURGICAL HISTORY:  Past Surgical History  Procedure Laterality Date  . Shoulder surgery      right -removal of bone spur  . Rectocele repair    . Hysteroscopy w/d&c  08/15/2011    Procedure: DILATATION AND CURETTAGE (D&C) /HYSTEROSCOPY;  Surgeon: Jonnie Kind, MD;  Location: [facility];  Service: Gynecology;  Laterality: N/A;  With Suction Curette  . Cataract extraction    . Hiatal hernia repair    . Middle ear surgery    . Abdominal hysterectomy  09/12/2011    Procedure: HYSTERECTOMY ABDOMINAL;  Surgeon: Janie Morning, MD;  Location: [facility];  Service: Gynecology;  Laterality: N/A;  Total Abdominal Hysterectomy, Bilateral Salpingo Oophorectomy  . Salpingoophorectomy  09/12/2011    Procedure: SALPINGO OOPHERECTOMY;  Surgeon: Janie Morning, MD;  Location: [facility];  Service: Gynecology;  Laterality: Bilateral;  . Colonoscopy  07/05/2012    Procedure: COLONOSCOPY;  Surgeon: Rogene Houston, MD;  Location: [facility];  Service: Endoscopy;  Laterality: N/A;  730    FAMILY HISTORY: family history includes Cancer in her father; Other in her mother. There is no history of Anesthesia problems, Hypotension, Malignant hyperthermia, or Pseudochol deficiency.  SOCIAL HISTORY:  reports that she has never smoked. She has never used smokeless tobacco. She reports that she does not drink alcohol or use illicit drugs.  REVIEW OF SYSTEMS:  Other than that discussed above is  noncontributory.  PHYSICAL EXAMINATION: ECOG PERFORMANCE STATUS: 1 - Symptomatic but completely ambulatory  Blood pressure 115/71, pulse 60, temperature 97.8 F (36.6 C), temperature source Oral, resp. rate 18, weight 277 lb 6.4 oz (125.828 kg).  GENERAL:alert, no distress and comfortable. Morbidly obese. SKIN: skin color, texture, turgor are normal, no rashes or significant lesions EYES: PERLA; Conjunctiva are pink and non-injected, sclera clear SINUSES: No redness or tenderness over maxillary or ethmoid sinuses OROPHARYNX:no exudate, no erythema on lips, buccal mucosa, or tongue. NECK: supple, thyroid normal size, non-tender, without nodularity. No masses CHEST: Normal AP diameter with no breast masses. LYMPH:  no palpable lymphadenopathy in the cervical, axillary or inguinal LUNGS: clear to auscultation and percussion with normal breathing effort HEART: regular rate & rhythm and no murmurs. ABDOMEN:abdomen soft, non-tender and normal bowel sounds. Organomegaly not appreciated. MUSCULOSKELETAL:no cyanosis of digits and no clubbing. Range of motion normal. Some purplish discoloration of both feet. NEURO: alert & oriented x 3 with fluent speech, no focal motor/sensory deficits   LABORATORY DATA: Infusion on 04/01/2014  Component Date Value Ref Range Status  . WBC 04/01/2014 8.9  4.0 - 10.5 K/uL Final  . RBC 04/01/2014 4.91  3.87 - 5.11 MIL/uL Final  . Hemoglobin 04/01/2014 14.2  12.0 - 15.0 g/dL Final  . HCT 04/01/2014 43.4  36.0 - 46.0 % Final  . MCV 04/01/2014 88.4  78.0 - 100.0 fL Final  . MCH 04/01/2014 28.9  26.0 - 34.0 pg Final  . MCHC 04/01/2014 32.7  30.0 - 36.0 g/dL Final  . RDW 04/01/2014 18.9* 11.5 - 15.5 % Final  . Platelets 04/01/2014 459* 150 - 400 K/uL Final  . Neutrophils Relative % 04/01/2014 64  43 - 77 % Final  . Neutro Abs 04/01/2014 5.8  1.7 - 7.7 K/uL Final  . Lymphocytes Relative 04/01/2014 23  12 - 46 % Final  . Lymphs Abs 04/01/2014 2.0  0.7 - 4.0 K/uL  Final  . Monocytes Relative 04/01/2014 8  3 - 12 % Final  . Monocytes Absolute 04/01/2014 0.7  0.1 - 1.0 K/uL Final  . Eosinophils Relative 04/01/2014 4  0 - 5 % Final  . Eosinophils Absolute 04/01/2014 0.4  0.0 - 0.7 K/uL Final  . Basophils Relative 04/01/2014 1  0 - 1 % Final  . Basophils Absolute 04/01/2014 0.1  0.0 - 0.1 K/uL Final    PATHOLOGY: No new pathology. JAK-2 positive  Urinalysis    Component Value Date/Time   COLORURINE YELLOW 08/08/2011 1519   APPEARANCEUR CLEAR 08/08/2011 1519   LABSPEC 1.020 08/08/2011 1519   PHURINE 6.0 08/08/2011 1519   GLUCOSEU NEGATIVE 08/08/2011 1519   HGBUR MODERATE* 08/08/2011 1519   BILIRUBINUR NEGATIVE 08/08/2011 1519   KETONESUR NEGATIVE 08/08/2011 1519   PROTEINUR NEGATIVE 08/08/2011 1519   UROBILINOGEN 0.2 08/08/2011 1519   NITRITE NEGATIVE 08/08/2011 1519   LEUKOCYTESUR MODERATE* 08/08/2011 1519    RADIOGRAPHIC STUDIES: No results found.  ASSESSMENT:  #1. JAK-2 positive thrombocytosis, better controlled a higher dose of Hydrea. #2. Gout, quiescent..  #3. Giant cell tumor found incidentally in the upper extremity, no need for therapy.  #4. History of endometrial cancer, stage I, well-differentiated, status post TAH 09/12/2011, no postop therapy, normal Pap smear in January 2015  #5. Degenerative joint disease, on Celebrex  PLAN:  #1. Continue Hydrea 500 mg alternating with 1000 mg daily. #2. Followup in one month with CBC.   All questions were answered. The patient knows to call the clinic with any problems, questions or concerns. We can certainly see the patient much sooner if necessary.   I spent 25 minutes counseling the patient face to face. The total time spent in the appointment was 30 minutes.    Farrel Gobble, MD 04/02/2014 6:44 AM  DISCLAIMER:  This note was dictated with voice recognition software.  Similar sounding words can inadvertently be transcribed inaccurately and may not be corrected upon review.

## 2014-04-02 ENCOUNTER — Other Ambulatory Visit (HOSPITAL_COMMUNITY): Payer: Self-pay

## 2014-04-02 ENCOUNTER — Telehealth (HOSPITAL_COMMUNITY): Payer: Self-pay

## 2014-04-02 DIAGNOSIS — D471 Chronic myeloproliferative disease: Secondary | ICD-10-CM

## 2014-04-02 NOTE — Telephone Encounter (Signed)
Patient notified and verbalized understanding of instructions. 

## 2014-04-02 NOTE — Telephone Encounter (Signed)
Message copied by Mellissa Kohut on Thu Apr 02, 2014 10:24 AM ------      Message from: Farrel Gobble A      Created: Thu Apr 02, 2014  6:45 AM       Please call patient to tell her to increase Hydrea to 1000mg  daily--500mg  twice a day.  Thanks. Dr.F ------

## 2014-04-20 ENCOUNTER — Other Ambulatory Visit: Payer: Self-pay | Admitting: *Deleted

## 2014-04-20 MED ORDER — TRAMADOL-ACETAMINOPHEN 37.5-325 MG PO TABS: 1.0000 | ORAL_TABLET | ORAL | Status: DC | PRN

## 2014-04-30 ENCOUNTER — Other Ambulatory Visit (HOSPITAL_COMMUNITY): Payer: Medicare Other

## 2014-05-01 ENCOUNTER — Other Ambulatory Visit (HOSPITAL_COMMUNITY): Payer: Medicare Other

## 2014-05-01 ENCOUNTER — Ambulatory Visit (HOSPITAL_COMMUNITY): Payer: Medicare Other

## 2014-05-01 ENCOUNTER — Encounter (HOSPITAL_COMMUNITY): Payer: Medicare Other | Attending: Hematology and Oncology

## 2014-05-01 DIAGNOSIS — D47Z9 Other specified neoplasms of uncertain behavior of lymphoid, hematopoietic and related tissue: Secondary | ICD-10-CM | POA: Insufficient documentation

## 2014-05-01 DIAGNOSIS — D471 Chronic myeloproliferative disease: Secondary | ICD-10-CM

## 2014-05-01 LAB — CBC WITH DIFFERENTIAL/PLATELET
Basophils Absolute: 0 10*3/uL (ref 0.0–0.1)
Basophils Relative: 1 % (ref 0–1)
Eosinophils Absolute: 0.2 10*3/uL (ref 0.0–0.7)
Eosinophils Relative: 3 % (ref 0–5)
HCT: 44 % (ref 36.0–46.0)
HEMOGLOBIN: 14.6 g/dL (ref 12.0–15.0)
LYMPHS ABS: 1.9 10*3/uL (ref 0.7–4.0)
Lymphocytes Relative: 25 % (ref 12–46)
MCH: 30.7 pg (ref 26.0–34.0)
MCHC: 33.2 g/dL (ref 30.0–36.0)
MCV: 92.4 fL (ref 78.0–100.0)
MONOS PCT: 7 % (ref 3–12)
Monocytes Absolute: 0.5 10*3/uL (ref 0.1–1.0)
NEUTROS ABS: 4.8 10*3/uL (ref 1.7–7.7)
NEUTROS PCT: 64 % (ref 43–77)
Platelets: 396 10*3/uL (ref 150–400)
RBC: 4.76 MIL/uL (ref 3.87–5.11)
RDW: 18.8 % — ABNORMAL HIGH (ref 11.5–15.5)
WBC: 7.4 10*3/uL (ref 4.0–10.5)

## 2014-05-01 NOTE — Progress Notes (Unsigned)
[name]'s reason for visit today are for labs as scheduled per MD orders.  Venipuncture performed with a 23 gauge butterfly needle to L Antecubital.  [name] tolerated venipuncture well and without incident; questions were answered and patient was discharged.

## 2014-05-02 NOTE — Progress Notes (Signed)
Lori Noble, MD [address] [place] [address]  Myeloproliferative disorder, JAK-2 positive - Plan: hydroxyurea (HYDREA) 500 MG capsule, CBC, CANCELED: CBC  Thrombocytosis  CURRENT THERAPY: Hydrea 1000 mg daily.   INTERVAL HISTORY: [name] 73 y.o. female returns for  regular  visit for followup of JAK-2 positive myeloproliferative disorder manifesting as thrombocytosis.   I personally reviewed and went over laboratory results with the patient.  The results are noted within this dictation.  She is tolerating Hydrea well. She denies any hematologic complaints and ROS questioning is negative.   She note fatigue.  She is seeing Dr. Willey Blade for her breathing issues including COPD.  I suspect this is playing a part in her fatigue in addition to her other chronic diseases including obesity and deconditioning.  She reports that Dr. Willey Blade recently changed her inhaler from Symbicort to Pro-Air.  I recommended continued follow-up with Dr. Willey Blade as directed.   She asks about other cancers and whether her labs will demonstrate a malignancy.  I provided her education that is some rare circumstances, we can identify some lab changes that makes [place] wonder about malignancy.  With [place] checking her CBC, we may be able to detect leukemia, and possibly lymphoma, MDS, MM, but other cancers would be undetectable until late stages of the disease if we are only monitor her labs and ignoring clinical symptoms.  "Then how do we monitor for cancer?"  She is encouraged to follow the [place] Task force recommendations for annual mammograms, PAP smears as directed, and colonoscopies as directed.   We do not have a single blood tests that monitors for malignancies.   Hematologically, she denies any complaints and ROS questioning is negative.   Past Medical History  Diagnosis Date  . Arthritis   . Asthma   . Hypertension   . GERD (gastroesophageal reflux disease)   . Obesity, morbid (more than  100 lbs over ideal weight or BMI > 40) 08/2011    Ht 5'7", wt 280 lb  . Bronchitis, chronic obstructive since 2000's  . Shortness of breath     unable to lay flat due to dyspnea  . CHF (congestive heart failure)     possible CHF on CXR of 08/15/11  . Hiatal hernia   . PONV (postoperative nausea and vomiting)     RESP DIFFICULTY  POST OP  . Hiatal hernia 11/19/2012  . Cancer     dx with endometrial cancer   . Cataract     bilat cataract extraction 10 years ago  . Heart murmur   . Myeloproliferative disorder, JAK-2 positive 01/05/2014  . Polycythemia vera(238.4) 01/18/2014  . Gout     has KNEE, ARTHRITIS, DEGEN./OSTEO; Chronic lung disease; Endometrial cancer; Acute respiratory failure; Bilateral pulmonary infiltrates on chest x-ray; Leukocytosis; Hyperglycemia; GERD (gastroesophageal reflux disease); Hiatal hernia; CMC arthritis, thumb, degenerative; Bone, giant cell tumor; Thrombocytosis; and Myeloproliferative disorder, JAK-2 positive on her problem list.     is allergic to carbamazepine.  [name] had no medications administered during this visit.  Past Surgical History  Procedure Laterality Date  . Shoulder surgery      right -removal of bone spur  . Rectocele repair    . Hysteroscopy w/d&c  08/15/2011    Procedure: DILATATION AND CURETTAGE (D&C) /HYSTEROSCOPY;  Surgeon: Jonnie Kind, MD;  Location: [facility];  Service: Gynecology;  Laterality: N/A;  With Suction Curette  . Cataract extraction    . Hiatal hernia repair    .  Middle ear surgery    . Abdominal hysterectomy  09/12/2011    Procedure: HYSTERECTOMY ABDOMINAL;  Surgeon: Janie Morning, MD;  Location: [facility];  Service: Gynecology;  Laterality: N/A;  Total Abdominal Hysterectomy, Bilateral Salpingo Oophorectomy  . Salpingoophorectomy  09/12/2011    Procedure: SALPINGO OOPHERECTOMY;  Surgeon: Janie Morning, MD;  Location: [facility];  Service: Gynecology;  Laterality: Bilateral;  . Colonoscopy  07/05/2012    Procedure:  COLONOSCOPY;  Surgeon: Rogene Houston, MD;  Location: [facility];  Service: Endoscopy;  Laterality: N/A;  730    Denies any headaches, dizziness, double vision, fevers, chills, night sweats, nausea, vomiting, diarrhea, constipation, chest pain, heart palpitations, blood in stool, black tarry stool, urinary pain, urinary burning, urinary frequency, hematuria.   PHYSICAL EXAMINATION  ECOG PERFORMANCE STATUS: 1 - Symptomatic but completely ambulatory  Filed Vitals:   05/05/14 1526  BP: 121/63  Pulse: 63  Temp: 97.9 F (36.6 C)  Resp: 16    GENERAL:alert, no distress, well nourished, well developed, comfortable, cooperative, obese and smiling SKIN: skin color, texture, turgor are normal, no rashes or significant lesions HEAD: Normocephalic, No masses, lesions, tenderness or abnormalities EYES: normal, PERRLA, EOMI, Conjunctiva are pink and non-injected EARS: External ears normal OROPHARYNX:mucous membranes are moist  NECK: supple, no adenopathy, trachea midline LYMPH:  no palpable lymphadenopathy BREAST:not examined LUNGS: clear to auscultation  HEART: regular rate & rhythm, no murmurs and no gallops ABDOMEN:abdomen soft, non-tender, obese and normal bowel sounds BACK: Back symmetric, no curvature. EXTREMITIES:less then 2 second capillary refill, no joint deformities, effusion, or inflammation, no skin discoloration, no clubbing, no cyanosis  NEURO: alert & oriented x 3 with fluent speech, no focal motor/sensory deficits, gait normal   LABORATORY DATA: CBC    Component Value Date/Time   WBC 7.4 05/01/2014 1545   RBC 4.76 05/01/2014 1545   RBC 5.53* 12/19/2013 1545   HGB 14.6 05/01/2014 1545   HCT 44.0 05/01/2014 1545   PLT 396 05/01/2014 1545   MCV 92.4 05/01/2014 1545   MCH 30.7 05/01/2014 1545   MCHC 33.2 05/01/2014 1545   RDW 18.8* 05/01/2014 1545   LYMPHSABS 1.9 05/01/2014 1545   MONOABS 0.5 05/01/2014 1545   EOSABS 0.2 05/01/2014 1545   BASOSABS 0.0 05/01/2014 1545        ASSESSMENT:  1. JAK-2 positive thrombocytosis, controlled on Hydrea. 2. Giant cell tumor found incidentally in the upper extremity, no need for therapy. 3. History of endometrial cancer, stage I, well-differentiated, status post TAH 09/12/2011, no postop therapy, normal Pap smear in January 2015  4. Degenerative joint disease, on Celebrex  5. Fatigue, multifactorial   Patient Active Problem List   Diagnosis Date Noted  . Myeloproliferative disorder, JAK-2 positive 01/05/2014  . Thrombocytosis 12/19/2013  . CMC arthritis, thumb, degenerative 06/03/2013  . Bone, giant cell tumor 06/03/2013  . GERD (gastroesophageal reflux disease) 11/19/2012  . Hiatal hernia 11/19/2012  . Acute respiratory failure 09/13/2011  . Bilateral pulmonary infiltrates on chest x-ray 09/13/2011  . Leukocytosis 09/13/2011  . Hyperglycemia 09/13/2011  . Endometrial cancer 09/12/2011  . Chronic lung disease 08/16/2011    Class: Chronic  . KNEE, ARTHRITIS, DEGEN./OSTEO 05/24/2010     PLAN:  1. I personally reviewed and went over laboratory results with the patient.  The results are noted within this dictation. 2. Continue Hydrea 1000 mg daily 3. Labs every 4 weeks: CBC 4. Recommend follow-up with primary care provider for breathing issues and chronic fatigue. 5. Return in 4 months  for follow-up   THERAPY PLAN:  She will continue with Hydrea as ordered and we will monitor counts every 4 weeks.  All questions were answered. The patient knows to call the clinic with any problems, questions or concerns. We can certainly see the patient much sooner if necessary.  Patient and plan discussed with Dr. Farrel Gobble and he is in agreement with the aforementioned.   KEFALAS,THOMAS 05/05/2014

## 2014-05-05 ENCOUNTER — Encounter (HOSPITAL_COMMUNITY): Payer: Self-pay | Admitting: Oncology

## 2014-05-05 ENCOUNTER — Encounter (HOSPITAL_BASED_OUTPATIENT_CLINIC_OR_DEPARTMENT_OTHER): Payer: Medicare Other | Admitting: Oncology

## 2014-05-05 VITALS — BP 121/63 | HR 63 | Temp 97.9°F | Resp 16

## 2014-05-05 DIAGNOSIS — Z8542 Personal history of malignant neoplasm of other parts of uterus: Secondary | ICD-10-CM

## 2014-05-05 DIAGNOSIS — R5383 Other fatigue: Secondary | ICD-10-CM

## 2014-05-05 DIAGNOSIS — D75839 Thrombocytosis, unspecified: Secondary | ICD-10-CM

## 2014-05-05 DIAGNOSIS — R5381 Other malaise: Secondary | ICD-10-CM

## 2014-05-05 DIAGNOSIS — D473 Essential (hemorrhagic) thrombocythemia: Secondary | ICD-10-CM

## 2014-05-05 DIAGNOSIS — D471 Chronic myeloproliferative disease: Secondary | ICD-10-CM

## 2014-05-05 MED ORDER — HYDROXYUREA 500 MG PO CAPS: 1000.0000 mg | ORAL_CAPSULE | Freq: Every day | ORAL | Status: DC

## 2014-05-05 NOTE — Patient Instructions (Signed)
[facility] Discharge Instructions  RECOMMENDATIONS MADE BY THE CONSULTANT AND ANY TEST RESULTS WILL BE SENT TO YOUR REFERRING PHYSICIAN.  EXAM FINDINGS BY THE PHYSICIAN TODAY AND SIGNS OR SYMPTOMS TO REPORT TO CLINIC OR PRIMARY PHYSICIAN: Exam and findings as discussed by T. Sheldon Silvan, PA-C.  MEDICATIONS PRESCRIBED:  1.  Hydrea 1000mg  daily PO  INSTRUCTIONS/FOLLOW-UP: 1.  Return monthly as scheduled for labs.  Return in 4 months for an office visit as scheduled.  Thank you for choosing [facility] to provide your oncology and hematology care.  To afford each patient quality time with our providers, please arrive at least 15 minutes before your scheduled appointment time.  With your help, our goal is to use those 15 minutes to complete the necessary work-up to ensure our physicians have the information they need to help with your evaluation and healthcare recommendations.    Effective January 1st, 2014, we ask that you re-schedule your appointment with our physicians should you arrive 10 or more minutes late for your appointment.  We strive to give you quality time with our providers, and arriving late affects you and other patients whose appointments are after yours.    Again, thank you for choosing [facility].  Our hope is that these requests will decrease the amount of time that you wait before being seen by our physicians.       _____________________________________________________________  Should you have questions after your visit to [facility], please contact our office at [phone] between the hours of 8:30 a.m. and 4:30 p.m.  Voicemails left after 4:30 p.m. will not be returned until the following business day.  For prescription refill requests, have your pharmacy contact our office with your prescription refill request.    _______________________________________________________________  We hope that we have given you  very good care.  You may receive a patient satisfaction survey in the mail, please complete it and return it as soon as possible.  We value your feedback!  _______________________________________________________________  Have you asked about our STAR program?  STAR stands for Survivorship Training and Rehabilitation, and this is a nationally recognized cancer care program that focuses on survivorship and rehabilitation.  Cancer and cancer treatments may cause problems, such as, pain, making you feel tired and keeping you from doing the things that you need or want to do. Cancer rehabilitation can help. Our goal is to reduce these troubling effects and help you have the best quality of life possible.  You may receive a survey from a nurse that asks questions about your current state of health.  Based on the survey results, all eligible patients will be referred to the [facility] program for an evaluation so we can better serve you!  A frequently asked questions sheet is available upon request.

## 2014-06-11 ENCOUNTER — Other Ambulatory Visit: Payer: Self-pay | Admitting: *Deleted

## 2014-06-11 MED ORDER — TRAMADOL-ACETAMINOPHEN 37.5-325 MG PO TABS: 1.0000 | ORAL_TABLET | ORAL | Status: DC | PRN

## 2014-06-24 ENCOUNTER — Encounter (INDEPENDENT_AMBULATORY_CARE_PROVIDER_SITE_OTHER): Payer: Self-pay | Admitting: *Deleted

## 2014-06-25 ENCOUNTER — Other Ambulatory Visit (HOSPITAL_COMMUNITY): Payer: Self-pay

## 2014-06-25 DIAGNOSIS — D471 Chronic myeloproliferative disease: Secondary | ICD-10-CM

## 2014-06-25 DIAGNOSIS — D473 Essential (hemorrhagic) thrombocythemia: Secondary | ICD-10-CM

## 2014-06-25 DIAGNOSIS — D72829 Elevated white blood cell count, unspecified: Secondary | ICD-10-CM

## 2014-06-25 DIAGNOSIS — D75839 Thrombocytosis, unspecified: Secondary | ICD-10-CM

## 2014-07-02 ENCOUNTER — Encounter (HOSPITAL_COMMUNITY): Payer: Medicare Other | Attending: Hematology and Oncology

## 2014-07-02 DIAGNOSIS — D471 Chronic myeloproliferative disease: Secondary | ICD-10-CM

## 2014-07-02 DIAGNOSIS — D47Z9 Other specified neoplasms of uncertain behavior of lymphoid, hematopoietic and related tissue: Secondary | ICD-10-CM

## 2014-07-02 LAB — CBC
HEMATOCRIT: 40.4 % (ref 36.0–46.0)
HEMOGLOBIN: 13.8 g/dL (ref 12.0–15.0)
MCH: 34.8 pg — AB (ref 26.0–34.0)
MCHC: 34.2 g/dL (ref 30.0–36.0)
MCV: 102 fL — AB (ref 78.0–100.0)
Platelets: 296 10*3/uL (ref 150–400)
RBC: 3.96 MIL/uL (ref 3.87–5.11)
RDW: 18 % — ABNORMAL HIGH (ref 11.5–15.5)
WBC: 8.2 10*3/uL (ref 4.0–10.5)

## 2014-07-02 NOTE — Progress Notes (Signed)
[name] presented for labwork. Labs per MD order drawn via Peripheral Line 23 gauge needle inserted in RT AC  Good blood return present. Procedure without incident.  Needle removed intact. Patient tolerated procedure well.

## 2014-07-09 ENCOUNTER — Ambulatory Visit: Payer: Medicare Other | Admitting: Orthopedic Surgery

## 2014-08-03 ENCOUNTER — Encounter (HOSPITAL_COMMUNITY): Payer: Medicare Other | Attending: Hematology and Oncology

## 2014-08-03 DIAGNOSIS — D471 Chronic myeloproliferative disease: Secondary | ICD-10-CM

## 2014-08-03 DIAGNOSIS — D47Z9 Other specified neoplasms of uncertain behavior of lymphoid, hematopoietic and related tissue: Secondary | ICD-10-CM | POA: Diagnosis present

## 2014-08-03 LAB — CBC
HEMATOCRIT: 39.9 % (ref 36.0–46.0)
Hemoglobin: 13.7 g/dL (ref 12.0–15.0)
MCH: 36.3 pg — AB (ref 26.0–34.0)
MCHC: 34.3 g/dL (ref 30.0–36.0)
MCV: 105.8 fL — ABNORMAL HIGH (ref 78.0–100.0)
Platelets: 253 10*3/uL (ref 150–400)
RBC: 3.77 MIL/uL — ABNORMAL LOW (ref 3.87–5.11)
RDW: 15.4 % (ref 11.5–15.5)
WBC: 7 10*3/uL (ref 4.0–10.5)

## 2014-08-25 ENCOUNTER — Other Ambulatory Visit (HOSPITAL_COMMUNITY): Payer: Self-pay

## 2014-09-02 ENCOUNTER — Encounter (HOSPITAL_COMMUNITY): Payer: Medicare Other | Attending: Hematology and Oncology

## 2014-09-02 DIAGNOSIS — D471 Chronic myeloproliferative disease: Secondary | ICD-10-CM | POA: Insufficient documentation

## 2014-09-02 LAB — CBC
HCT: 39.2 % (ref 36.0–46.0)
Hemoglobin: 13.4 g/dL (ref 12.0–15.0)
MCH: 37.1 pg — ABNORMAL HIGH (ref 26.0–34.0)
MCHC: 34.2 g/dL (ref 30.0–36.0)
MCV: 108.6 fL — ABNORMAL HIGH (ref 78.0–100.0)
PLATELETS: 291 10*3/uL (ref 150–400)
RBC: 3.61 MIL/uL — ABNORMAL LOW (ref 3.87–5.11)
RDW: 13.9 % (ref 11.5–15.5)
WBC: 7.4 10*3/uL (ref 4.0–10.5)

## 2014-09-02 NOTE — Progress Notes (Signed)
[name] presented for labwork. Labs per MD order drawn via Peripheral Line 23 gauge needle inserted in RT AC Good blood return present. Procedure without incident.  Needle removed intact. Patient tolerated procedure well.

## 2014-09-02 NOTE — Progress Notes (Signed)
Patient taking Hydrea 1000mg  daily. Has not missed any doses.

## 2014-09-06 DIAGNOSIS — J189 Pneumonia, unspecified organism: Secondary | ICD-10-CM

## 2014-09-06 HISTORY — DX: Pneumonia, unspecified organism: J18.9

## 2014-09-07 ENCOUNTER — Encounter (HOSPITAL_COMMUNITY): Payer: Self-pay | Admitting: Oncology

## 2014-09-09 ENCOUNTER — Other Ambulatory Visit (HOSPITAL_COMMUNITY): Payer: Self-pay | Admitting: Oncology

## 2014-09-09 DIAGNOSIS — D471 Chronic myeloproliferative disease: Secondary | ICD-10-CM

## 2014-09-09 MED ORDER — HYDROXYUREA 500 MG PO CAPS: 1000.0000 mg | ORAL_CAPSULE | Freq: Every day | ORAL | Status: DC

## 2014-09-15 ENCOUNTER — Encounter (INDEPENDENT_AMBULATORY_CARE_PROVIDER_SITE_OTHER): Payer: Self-pay | Admitting: Internal Medicine

## 2014-09-15 ENCOUNTER — Ambulatory Visit (INDEPENDENT_AMBULATORY_CARE_PROVIDER_SITE_OTHER): Payer: Medicare Other | Admitting: Internal Medicine

## 2014-09-15 ENCOUNTER — Telehealth (INDEPENDENT_AMBULATORY_CARE_PROVIDER_SITE_OTHER): Payer: Self-pay | Admitting: *Deleted

## 2014-09-15 VITALS — BP 142/58 | HR 56 | Temp 97.9°F | Ht 64.0 in | Wt 277.7 lb

## 2014-09-15 DIAGNOSIS — K219 Gastro-esophageal reflux disease without esophagitis: Secondary | ICD-10-CM

## 2014-09-15 MED ORDER — ESOMEPRAZOLE MAGNESIUM 40 MG PO CPDR: 40.0000 mg | DELAYED_RELEASE_CAPSULE | Freq: Every morning | ORAL | Status: AC

## 2014-09-15 NOTE — Progress Notes (Signed)
Subjective:    Patient ID: [name], female    DOB: [birth year], 73 y.o.   MRN: [id number]  HPI Here today for f/u of her GERD. She takes Nexium for her acid reflux. For the most part it controls her acid reflux.She is buying the Nexium OTC. The co-pay for Nexium would have been 200.00 dollars. Her appetite is good. No weight loss. No dysphagia.  There is no abdominal pain. She usually has a BM every other day and sometimes once a day. No melena or BRRB. No NSAIDs except for the ASA 81mg .  Her last colonoscopy   Hx of JAK 2 positive myeloproliferative disease and is followed at the [facility] in [place] and presently taking Hydrea.  11/2012 Upper GI:  IMPRESSION: Large recurrent hiatal hernia with significant gastroesophageal reflux. Diffuse esophageal dysmotility. Otherwise negative exam.  07/05/2012 Colonoscopy:  Impression:  Examination performed to cecum the area behind the valve and appendiceal orifice not seen. Redundant colon with few diverticula at sigmoid and descending colon.  10/2012 [id number]:  1. Scattered diverticulosis, greatest in the sigmoid colon. 2. No evidence of mass, stricture or polyp.    CBC    Component Value Date/Time   WBC 7.4 09/02/2014 1512   RBC 3.61* 09/02/2014 1512   RBC 5.53* 12/19/2013 1545   HGB 13.4 09/02/2014 1512   HCT 39.2 09/02/2014 1512   PLT 291 09/02/2014 1512   MCV 108.6* 09/02/2014 1512   MCH 37.1* 09/02/2014 1512   MCHC 34.2 09/02/2014 1512   RDW 13.9 09/02/2014 1512   LYMPHSABS 1.9 05/01/2014 1545   MONOABS 0.5 05/01/2014 1545   EOSABS 0.2 05/01/2014 1545   BASOSABS 0.0 05/01/2014 1545        Review of Systems Past Medical History  Diagnosis Date  . Arthritis   . Asthma   . Hypertension   . GERD (gastroesophageal reflux disease)   . Obesity, morbid (more than 100 lbs over ideal weight or BMI > 40) 08/2011    Ht 5'7", wt 280 lb  . Bronchitis, chronic obstructive since 2000's  . Shortness  of breath     unable to lay flat due to dyspnea  . CHF (congestive heart failure)     possible CHF on CXR of 08/15/11  . Hiatal hernia   . PONV (postoperative nausea and vomiting)     RESP DIFFICULTY  POST OP  . Hiatal hernia 11/19/2012  . Cancer     dx with endometrial cancer   . Cataract     bilat cataract extraction 10 years ago  . Heart murmur   . Myeloproliferative disorder, JAK-2 positive 01/05/2014  . Polycythemia vera(238.4) 01/18/2014  . Gout     Past Surgical History  Procedure Laterality Date  . Shoulder surgery      right -removal of bone spur  . Rectocele repair    . Hysteroscopy w/d&c  08/15/2011    Procedure: DILATATION AND CURETTAGE (D&C) /HYSTEROSCOPY;  Surgeon: Jonnie Kind, MD;  Location: [facility];  Service: Gynecology;  Laterality: N/A;  With Suction Curette  . Cataract extraction    . Hiatal hernia repair    . Middle ear surgery    . Abdominal hysterectomy  09/12/2011    Procedure: HYSTERECTOMY ABDOMINAL;  Surgeon: Janie Morning, MD;  Location: [facility];  Service: Gynecology;  Laterality: N/A;  Total Abdominal Hysterectomy, Bilateral Salpingo Oophorectomy  . Salpingoophorectomy  09/12/2011    Procedure: SALPINGO OOPHERECTOMY;  Surgeon: Janie Morning, MD;  Location: [facility];  Service: Gynecology;  Laterality: Bilateral;  . Colonoscopy  07/05/2012    Procedure: COLONOSCOPY;  Surgeon: Rogene Houston, MD;  Location: [facility];  Service: Endoscopy;  Laterality: N/A;  730    Allergies  Allergen Reactions  . Carbamazepine Hives and Other (See Comments)    headache  . Sulfa Antibiotics     Rash    Current Outpatient Prescriptions on File Prior to Visit  Medication Sig Dispense Refill  . aspirin EC 81 MG tablet Take 81 mg by mouth at bedtime.     Marland Kitchen atenolol (TENORMIN) 50 MG tablet Take 25 mg by mouth 2 (two) times daily.     . celecoxib (CELEBREX) 200 MG capsule Take 200 mg by mouth every morning.     . cloNIDine (CATAPRES) 0.3 MG tablet Take 0.15 mg by mouth  2 (two) times daily.     Marland Kitchen diltiazem (CARDIZEM CD) 180 MG 24 hr capsule Take 180 mg by mouth at bedtime.     . docusate sodium (COLACE) 100 MG capsule Take 100 mg by mouth at bedtime.    Marland Kitchen doxylamine, Sleep, (UNISOM) 25 MG tablet Take 25 mg by mouth at bedtime.      Marland Kitchen esomeprazole (NEXIUM) 40 MG capsule Take 40 mg by mouth every morning.     . furosemide (LASIX) 40 MG tablet Take 40 mg by mouth every morning.     . hydrochlorothiazide 25 MG tablet Take 25 mg by mouth every morning.     . hydroxyurea (HYDREA) 500 MG capsule Take 2 capsules (1,000 mg total) by mouth daily. 60 capsule 3  . losartan (COZAAR) 100 MG tablet Take 100 mg by mouth daily.     . Multiple Vitamin (MULTIVITAMIN) tablet Take 1 tablet by mouth every morning.     . simvastatin (ZOCOR) 10 MG tablet Take 10 mg by mouth daily after supper.    . traMADol-acetaminophen (ULTRACET) 37.5-325 MG per tablet Take 1 tablet by mouth every 4 (four) hours as needed. 90 tablet 0  . albuterol (PROVENTIL) (5 MG/ML) 0.5% nebulizer solution Take 2.5 mg by nebulization every 4 (four) hours as needed. For shortness of breath     No current facility-administered medications on file prior to visit.        Objective:   Physical Exam  Filed Vitals:   09/15/14 1448  Height: 5\' 4"  (1.626 m)  Weight: 277 lb 11.2 oz (125.964 kg)  Alert and oriented. Skin warm and dry. Oral mucosa is moist.   . Sclera anicteric, conjunctivae is pink. Thyroid not enlarged. No cervical lymphadenopathy. Lungs clear. Heart regular rate and rhythm.Murmur heard.   Abdomen is soft. Bowel sounds are positive. No hepatomegaly. No abdominal masses felt. No tenderness.  1+edema to lower extremities.           Assessment & Plan:  GERD, controlled at this time.  Obesity: Patient advised to diet and exercise.

## 2014-09-15 NOTE — Patient Instructions (Signed)
OV in one year. 

## 2014-09-15 NOTE — Telephone Encounter (Signed)
The medicine name she could not remember at the time of her apt was Esomeprazole Magnesium 40 mg daily. [name] said this was the medicine she discussed with you that was cheaper than Nexium.

## 2014-09-15 NOTE — Telephone Encounter (Signed)
Rx sent to her pharmqcy

## 2014-09-18 ENCOUNTER — Telehealth (INDEPENDENT_AMBULATORY_CARE_PROVIDER_SITE_OTHER): Payer: Self-pay | Admitting: *Deleted

## 2014-09-18 NOTE — Telephone Encounter (Signed)
Patient's chart was opened to review information for a Prior Auth for Lidocaine Patches.

## 2014-09-30 ENCOUNTER — Encounter (HOSPITAL_COMMUNITY): Payer: Medicare Other

## 2014-09-30 ENCOUNTER — Encounter (HOSPITAL_BASED_OUTPATIENT_CLINIC_OR_DEPARTMENT_OTHER): Payer: Medicare Other

## 2014-09-30 ENCOUNTER — Encounter (HOSPITAL_COMMUNITY): Payer: Self-pay

## 2014-09-30 ENCOUNTER — Encounter (HOSPITAL_COMMUNITY): Payer: Medicare Other | Attending: Hematology and Oncology

## 2014-09-30 ENCOUNTER — Ambulatory Visit (HOSPITAL_COMMUNITY): Payer: Medicare Other

## 2014-09-30 VITALS — BP 135/62 | HR 54 | Temp 97.9°F | Resp 20 | Wt 277.2 lb

## 2014-09-30 DIAGNOSIS — D473 Essential (hemorrhagic) thrombocythemia: Secondary | ICD-10-CM

## 2014-09-30 DIAGNOSIS — D471 Chronic myeloproliferative disease: Secondary | ICD-10-CM | POA: Insufficient documentation

## 2014-09-30 DIAGNOSIS — D75839 Thrombocytosis, unspecified: Secondary | ICD-10-CM

## 2014-09-30 DIAGNOSIS — D480 Neoplasm of uncertain behavior of bone and articular cartilage: Secondary | ICD-10-CM

## 2014-09-30 LAB — CBC
HCT: 40.1 % (ref 36.0–46.0)
Hemoglobin: 13.4 g/dL (ref 12.0–15.0)
MCH: 37.1 pg — ABNORMAL HIGH (ref 26.0–34.0)
MCHC: 33.4 g/dL (ref 30.0–36.0)
MCV: 111.1 fL — ABNORMAL HIGH (ref 78.0–100.0)
PLATELETS: 251 10*3/uL (ref 150–400)
RBC: 3.61 MIL/uL — ABNORMAL LOW (ref 3.87–5.11)
RDW: 12.9 % (ref 11.5–15.5)
WBC: 5.8 10*3/uL (ref 4.0–10.5)

## 2014-09-30 NOTE — Progress Notes (Signed)
LABS FOR CBC 

## 2014-09-30 NOTE — Patient Instructions (Signed)
[place] Discharge Instructions  RECOMMENDATIONS MADE BY THE CONSULTANT AND ANY TEST RESULTS WILL BE SENT TO YOUR REFERRING PHYSICIAN.  EXAM FINDINGS BY THE PHYSICIAN TODAY AND SIGNS OR SYMPTOMS TO REPORT TO CLINIC OR PRIMARY PHYSICIAN: Exam and findings as discussed by Dr. Barnet Glasgow.  Blood counts are good, no changes in therapy  Results for [name], [name] (MRN [id number]) as of 09/30/2014 13:14  Ref. Range 09/30/2014 12:28  WBC Latest Range: 4.0-10.5 K/uL 5.8  RBC Latest Range: 3.87-5.11 MIL/uL 3.61 (L)  Hemoglobin Latest Range: 12.0-15.0 g/dL 13.4  HCT Latest Range: 36.0-46.0 % 40.1  MCV Latest Range: 78.0-100.0 fL 111.1 (H)  MCH Latest Range: 26.0-34.0 pg 37.1 (H)  MCHC Latest Range: 30.0-36.0 g/dL 33.4  RDW Latest Range: 11.5-15.5 % 12.9  Platelets Latest Range: 150-400 K/uL 251   MEDICATIONS PRESCRIBED:  Continue Hydrea  INSTRUCTIONS/FOLLOW-UP: Follow-up in 6 months with labs and office visit.  Thank you for choosing [facility] to provide your oncology and hematology care.  To afford each patient quality time with our providers, please arrive at least 15 minutes before your scheduled appointment time.  With your help, our goal is to use those 15 minutes to complete the necessary work-up to ensure our physicians have the information they need to help with your evaluation and healthcare recommendations.    Effective January 1st, 2014, we ask that you re-schedule your appointment with our physicians should you arrive 10 or more minutes late for your appointment.  We strive to give you quality time with our providers, and arriving late affects you and other patients whose appointments are after yours.    Again, thank you for choosing [facility]  Our hope is that these requests will decrease the amount of time that you wait before being seen by our physicians.        _____________________________________________________________  Should you have questions after your visit to [facility], please contact our office at [phone] between the hours of 8:30 a.m. and 4:30 p.m.  Voicemails left after 4:30 p.m. will not be returned until the following business day.  For prescription refill requests, have your pharmacy contact our office with your prescription refill request.    _______________________________________________________________  We hope that we have given you very good care.  You may receive a patient satisfaction survey in the mail, please complete it and return it as soon as possible.  We value your feedback!  _______________________________________________________________  Have you asked about our STAR program?  STAR stands for Survivorship Training and Rehabilitation, and this is a nationally recognized cancer care program that focuses on survivorship and rehabilitation.  Cancer and cancer treatments may cause problems, such as, pain, making you feel tired and keeping you from doing the things that you need or want to do. Cancer rehabilitation can help. Our goal is to reduce these troubling effects and help you have the best quality of life possible.  You may receive a survey from a nurse that asks questions about your current state of health.  Based on the survey results, all eligible patients will be referred to the [facility] program for an evaluation so we can better serve you!  A frequently asked questions sheet is available upon request.

## 2014-09-30 NOTE — Progress Notes (Signed)
[place]  OFFICE PROGRESS Lori Miner, MD [address] [place] [address]  DIAGNOSIS: Thrombocytosis  Myeloproliferative disorder, JAK-2 positive  Bone, giant cell tumor  Chief Complaint  Patient presents with  . Essential thrombocytosis  . COPD    CURRENT THERAPY: Hydrea 1000 mg daily  INTERVAL HISTORY: [name] 73 y.o. female returns for follow-up of JAK-2 positive myeloproliferative disorder manifesting as thrombocytosis in the setting of chronic obstructive pulmonary disease.  She was treated by her PCP for pneumonia having completed antibiotic therapy one week ago. She is to see him next week to receive her influenza virus vaccine. She does have chronic cough without expectoration, fever, or night sweats. She denies any diarrhea, constipation, dysuria, hematuria, or worsening lower extremity swelling. She had developed pain and cramping in her thighs at bedtime and purchase an over-the-counter magnesium supplement which has helped dramatically. She denies any skin rash, headache, or seizures.  MEDICAL HISTORY: Past Medical History  Diagnosis Date  . Arthritis   . Asthma   . Hypertension   . GERD (gastroesophageal reflux disease)   . Obesity, morbid (more than 100 lbs over ideal weight or BMI > 40) 08/2011    Ht 5'7", wt 280 lb  . Bronchitis, chronic obstructive since 2000's  . Shortness of breath     unable to lay flat due to dyspnea  . CHF (congestive heart failure)     possible CHF on CXR of 08/15/11  . Hiatal hernia   . PONV (postoperative nausea and vomiting)     RESP DIFFICULTY  POST OP  . Hiatal hernia 11/19/2012  . Cancer     dx with endometrial cancer   . Cataract     bilat cataract extraction 10 years ago  . Heart murmur   . Myeloproliferative disorder, JAK-2 positive 01/05/2014  . Polycythemia vera(238.4) 01/18/2014  . Gout   . Pneumonia 09/2014    INTERIM HISTORY: has  KNEE, ARTHRITIS, DEGEN./OSTEO; Chronic lung disease; Endometrial cancer; Acute respiratory failure; Bilateral pulmonary infiltrates on chest x-ray; Leukocytosis; Hyperglycemia; GERD (gastroesophageal reflux disease); Hiatal hernia; CMC arthritis, thumb, degenerative; Bone, giant cell tumor; Thrombocytosis; and Myeloproliferative disorder, JAK-2 positive on her problem list.    ALLERGIES:  is allergic to carbamazepine and sulfa antibiotics.  MEDICATIONS: has a current medication list which includes the following prescription(s): aspirin ec, atenolol, celecoxib, clonidine, diltiazem, docusate sodium, doxylamine (sleep), esomeprazole, furosemide, hydrochlorothiazide, hydroxyurea, losartan, multivitamin, simvastatin, tramadol-acetaminophen, and albuterol.  SURGICAL HISTORY:  Past Surgical History  Procedure Laterality Date  . Shoulder surgery      right -removal of bone spur  . Rectocele repair    . Hysteroscopy w/d&c  08/15/2011    Procedure: DILATATION AND CURETTAGE (D&C) /HYSTEROSCOPY;  Surgeon: Jonnie Kind, MD;  Location: [facility];  Service: Gynecology;  Laterality: N/A;  With Suction Curette  . Cataract extraction    . Hiatal hernia repair    . Middle ear surgery    . Abdominal hysterectomy  09/12/2011    Procedure: HYSTERECTOMY ABDOMINAL;  Surgeon: Janie Morning, MD;  Location: [facility];  Service: Gynecology;  Laterality: N/A;  Total Abdominal Hysterectomy, Bilateral Salpingo Oophorectomy  . Salpingoophorectomy  09/12/2011    Procedure: SALPINGO OOPHERECTOMY;  Surgeon: Janie Morning, MD;  Location: [facility];  Service: Gynecology;  Laterality: Bilateral;  . Colonoscopy  07/05/2012    Procedure: COLONOSCOPY;  Surgeon: Rogene Houston, MD;  Location: [facility];  Service: Endoscopy;  Laterality: N/A;  730    FAMILY HISTORY: family history includes Cancer in her father; Other in her mother. There is no history of Anesthesia problems, Hypotension, Malignant hyperthermia, or Pseudochol  deficiency.  SOCIAL HISTORY:  reports that she has never smoked. She has never used smokeless tobacco. She reports that she does not drink alcohol or use illicit drugs.  REVIEW OF SYSTEMS:  Other than that discussed above is noncontributory.  PHYSICAL EXAMINATION: ECOG PERFORMANCE STATUS: 1 - Symptomatic but completely ambulatory  Blood pressure 135/62, pulse 54, temperature 97.9 F (36.6 C), temperature source Oral, resp. rate 20, weight 277 lb 3.2 oz (125.737 kg), SpO2 94 %.  GENERAL:alert, no distress and comfortable. Morbidly obese. SKIN: skin color, texture, turgor are normal, no rashes or significant lesions EYES: PERLA; Conjunctiva are pink and non-injected, sclera clear SINUSES: No redness or tenderness over maxillary or ethmoid sinuses OROPHARYNX:no exudate, no erythema on lips, buccal mucosa, or tongue. NECK: supple, thyroid normal size, non-tender, without nodularity. No masses CHEST: Increased AP diameter with no breast masses. LYMPH:  no palpable lymphadenopathy in the cervical, axillary or inguinal LUNGS: clear to auscultation and percussion with normal breathing effort HEART: regular rate & rhythm and no murmurs. ABDOMEN:abdomen soft, non-tender and normal bowel sounds. Obese and soft with no appreciable organomegaly. MUSCULOSKELETAL:no cyanosis of digits and no clubbing. Range of motion normal. +2 pitting edema bilaterally. NEURO: alert & oriented x 3 with fluent speech, no focal motor/sensory deficits   LABORATORY DATA: Lab on 09/30/2014  Component Date Value Ref Range Status  . WBC 09/30/2014 5.8  4.0 - 10.5 K/uL Final  . RBC 09/30/2014 3.61* 3.87 - 5.11 MIL/uL Final  . Hemoglobin 09/30/2014 13.4  12.0 - 15.0 g/dL Final  . HCT 09/30/2014 40.1  36.0 - 46.0 % Final  . MCV 09/30/2014 111.1* 78.0 - 100.0 fL Final  . MCH 09/30/2014 37.1* 26.0 - 34.0 pg Final  . MCHC 09/30/2014 33.4  30.0 - 36.0 g/dL Final  . RDW 09/30/2014 12.9  11.5 - 15.5 % Final  . Platelets  09/30/2014 251  150 - 400 K/uL Final  Lab on 09/02/2014  Component Date Value Ref Range Status  . WBC 09/02/2014 7.4  4.0 - 10.5 K/uL Final  . RBC 09/02/2014 3.61* 3.87 - 5.11 MIL/uL Final  . Hemoglobin 09/02/2014 13.4  12.0 - 15.0 g/dL Final  . HCT 09/02/2014 39.2  36.0 - 46.0 % Final  . MCV 09/02/2014 108.6* 78.0 - 100.0 fL Final  . MCH 09/02/2014 37.1* 26.0 - 34.0 pg Final  . MCHC 09/02/2014 34.2  30.0 - 36.0 g/dL Final  . RDW 09/02/2014 13.9  11.5 - 15.5 % Final  . Platelets 09/02/2014 291  150 - 400 K/uL Final    PATHOLOGY: No new pathology.  Urinalysis    Component Value Date/Time   COLORURINE YELLOW 08/08/2011 [place] 08/08/2011 1519   LABSPEC 1.020 08/08/2011 1519   PHURINE 6.0 08/08/2011 1519   GLUCOSEU NEGATIVE 08/08/2011 1519   HGBUR MODERATE* 08/08/2011 1519   BILIRUBINUR NEGATIVE 08/08/2011 1519   KETONESUR NEGATIVE 08/08/2011 1519   PROTEINUR NEGATIVE 08/08/2011 1519   UROBILINOGEN 0.2 08/08/2011 1519   NITRITE NEGATIVE 08/08/2011 1519   LEUKOCYTESUR MODERATE* 08/08/2011 1519    RADIOGRAPHIC STUDIES: No results found.  ASSESSMENT:  1. JAK-2 positive thrombocytosis, controlled on Hydrea. 2. Giant cell tumor found incidentally in the upper extremity, no need for therapy. 3. History of endometrial cancer, stage  I, well-differentiated, status post TAH 09/12/2011, no postop therapy, normal Pap smear in January 2015  4. Degenerative joint disease, on Celebrex . 5. Macrocytosis secondary to Hydrea.   PLAN:  #1. Continue Hydrea 1000 mg daily. #2. Follow-up in 6 months with CBC, chem profile, LDH.   All questions were answered. The patient knows to call the clinic with any problems, questions or concerns. We can certainly see the patient much sooner if necessary.   I spent 25 minutes counseling the patient face to face. The total time spent in the appointment was 30 minutes.    Doroteo Bradford, MD 09/30/2014 1:47 PM  DISCLAIMER:   This note was dictated with voice recognition software.  Similar sounding words can inadvertently be transcribed inaccurately and may not be corrected upon review.

## 2014-10-08 ENCOUNTER — Encounter (HOSPITAL_COMMUNITY): Payer: Medicare Other

## 2014-10-12 ENCOUNTER — Other Ambulatory Visit: Payer: Self-pay | Admitting: *Deleted

## 2014-10-12 MED ORDER — TRAMADOL-ACETAMINOPHEN 37.5-325 MG PO TABS: 1.0000 | ORAL_TABLET | ORAL | Status: DC | PRN

## 2014-10-20 ENCOUNTER — Other Ambulatory Visit (HOSPITAL_COMMUNITY)
Admission: RE | Admit: 2014-10-20 | Discharge: 2014-10-20 | Disposition: A | Discharge status: Home / Self Care | Payer: Medicare Other | Source: Ambulatory Visit | Attending: Obstetrics and Gynecology | Admitting: Obstetrics and Gynecology

## 2014-10-20 ENCOUNTER — Ambulatory Visit (INDEPENDENT_AMBULATORY_CARE_PROVIDER_SITE_OTHER): Payer: Medicare Other | Admitting: Obstetrics and Gynecology

## 2014-10-20 ENCOUNTER — Encounter: Payer: Self-pay | Admitting: Obstetrics and Gynecology

## 2014-10-20 DIAGNOSIS — Z01411 Encounter for gynecological examination (general) (routine) with abnormal findings: Secondary | ICD-10-CM | POA: Diagnosis present

## 2014-10-20 DIAGNOSIS — Z139 Encounter for screening, unspecified: Secondary | ICD-10-CM

## 2014-10-20 DIAGNOSIS — Z01419 Encounter for gynecological examination (general) (routine) without abnormal findings: Secondary | ICD-10-CM

## 2014-10-20 NOTE — Progress Notes (Signed)
Assessment:  Annual Gyn Exam  1. JAK-2 positive thrombocytosis, controlled on Hydrea. 2. Giant cell tumor found incidentally in the upper extremity, no need for therapy. 3. History of endometrial cancer, stage I, well-differentiated, status post TAH 09/12/2011, no postop therapy, normal Pap smear in January 2015  repeat pap without HPV today 4. Degenerative joint disease, on Celebrex . 5. Macrocytosis secondary to Hydrea.     Plan:  1. pap smear done, next pap due 2015 2. return annually or prn 3. 3. Lengthy weight loss and exercise for obese pt /copd pt discussed. 3    Annual mammogram advised Subjective:  [name] is a 73 y.o. female G5P0005 who presents for annual exam. No LMP recorded. Patient has had a hysterectomy. The patient has complaints today of obesity, decreased activity  The following portions of the patient's history were reviewed and updated as appropriate: allergies, current medications, past family history, past medical history, past social history, past surgical history and problem list. Past Medical History  Diagnosis Date  . Arthritis   . Asthma   . Hypertension   . GERD (gastroesophageal reflux disease)   . Obesity, morbid (more than 100 lbs over ideal weight or BMI > 40) 08/2011    Ht 5'7", wt 280 lb  . Bronchitis, chronic obstructive since 2000's  . Shortness of breath     unable to lay flat due to dyspnea  . CHF (congestive heart failure)     possible CHF on CXR of 08/15/11  . Hiatal hernia   . PONV (postoperative nausea and vomiting)     RESP DIFFICULTY  POST OP  . Hiatal hernia 11/19/2012  . Cancer     dx with endometrial cancer   . Cataract     bilat cataract extraction 10 years ago  . Heart murmur   . Myeloproliferative disorder, JAK-2 positive 01/05/2014  . Polycythemia vera(238.4) 01/18/2014  . Gout   . Pneumonia 09/2014    Past Surgical History  Procedure Laterality Date  . Shoulder surgery      right -removal of bone spur  .  Rectocele repair    . Hysteroscopy w/d&c  08/15/2011    Procedure: DILATATION AND CURETTAGE (D&C) /HYSTEROSCOPY;  Surgeon: Jonnie Kind, MD;  Location: [facility];  Service: Gynecology;  Laterality: N/A;  With Suction Curette  . Cataract extraction    . Hiatal hernia repair    . Middle ear surgery    . Abdominal hysterectomy  09/12/2011    Procedure: HYSTERECTOMY ABDOMINAL;  Surgeon: Janie Morning, MD;  Location: [facility];  Service: Gynecology;  Laterality: N/A;  Total Abdominal Hysterectomy, Bilateral Salpingo Oophorectomy  . Salpingoophorectomy  09/12/2011    Procedure: SALPINGO OOPHERECTOMY;  Surgeon: Janie Morning, MD;  Location: [facility];  Service: Gynecology;  Laterality: Bilateral;  . Colonoscopy  07/05/2012    Procedure: COLONOSCOPY;  Surgeon: Rogene Houston, MD;  [facility]: [facility];  Service: Endoscopy;  Laterality: N/A;  730    Current outpatient prescriptions: aspirin EC 81 MG tablet, Take 81 mg by mouth at bedtime. , Disp: , Rfl: ;  atenolol (TENORMIN) 50 MG tablet, Take 25 mg by mouth 2 (two) times daily. , Disp: , Rfl: ;  celecoxib (CELEBREX) 200 MG capsule, Take 200 mg by mouth 2 (two) times daily. , Disp: , Rfl: ;  cloNIDine (CATAPRES) 0.3 MG tablet, Take 0.15 mg by mouth 2 (two) times daily. , Disp: , Rfl:  diltiazem (CARDIZEM CD) 180 MG 24 hr capsule,  Take 180 mg by mouth at bedtime. , Disp: , Rfl: ;  docusate sodium (COLACE) 100 MG capsule, Take 100 mg by mouth as needed. , Disp: , Rfl: ;  doxylamine, Sleep, (UNISOM) 25 MG tablet, Take 25 mg by mouth at bedtime.  , Disp: , Rfl:  esomeprazole (NEXIUM) 40 MG capsule, Take 1 capsule (40 mg total) by mouth every morning. (Patient taking differently: Take 40 mg by mouth 2 (two) times daily as needed. ), Disp: 30 capsule, Rfl: 11;  hydrochlorothiazide 25 MG tablet, Take 25 mg by mouth every morning. , Disp: , Rfl: ;  hydroxyurea (HYDREA) 500 MG capsule, Take 2 capsules (1,000 mg total) by mouth daily., Disp: 60 capsule, Rfl: 3 losartan  (COZAAR) 100 MG tablet, Take 100 mg by mouth daily. , Disp: , Rfl: ;  Multiple Vitamin (MULTIVITAMIN) tablet, Take 1 tablet by mouth every morning. , Disp: , Rfl: ;  Simethicone (GAS RELIEF PO), Take by mouth as needed., Disp: , Rfl: ;  simvastatin (ZOCOR) 10 MG tablet, Take 10 mg by mouth daily after supper., Disp: , Rfl:  traMADol-acetaminophen (ULTRACET) 37.5-325 MG per tablet, Take 1 tablet by mouth every 4 (four) hours as needed., Disp: 90 tablet, Rfl: 0;  albuterol (PROVENTIL) (5 MG/ML) 0.5% nebulizer solution, Take 2.5 mg by nebulization every 4 (four) hours as needed. For shortness of breath, Disp: , Rfl:   Review of Systems Constitutional: positive for obesity, discussed in detail for weight exercises. Gastrointestinal: colonoscopy Genitourinary: mild sui  Objective:  BP 170/70 mmHg  Ht 5\' 4"  (1.626 m)  Wt 278 lb 8 oz (126.327 kg)  BMI 47.78 kg/m2   BMI: Body mass index is 47.78 kg/(m^2).  General Appearance: Alert, appropriate appearance for age. No acute distress HEENT: Grossly normal Neck / Thyroid:  Cardiovascular: RRR; normal S1, S2, no murmur Lungs: CTA bilaterally Back: No CVAT Breast Exam: No dimpling, nipple retraction or discharge. No masses or nodes. and No masses or nodes.No dimpling, nipple retraction or discharge. Gastrointestinal: Soft, non-tender, no masses or organomegaly Pelvic Exam: External genitalia: normal general appearance Urinary system: urethral meatus normal and bladder tender to palpation Vaginal: atrophic mucosa Cervix: absent Adnexa: normal bimanual exam Uterus: removed surgically Exam limited by body habitus Rectovaginal: not indicated s/p colonoscopy 2015 Lymphatic Exam: Non-palpable nodes in neck, clavicular, axillary, or inguinal regions Skin: no rash or abnormalities Neurologic: Normal gait and speech, no tremor  Psychiatric: Alert and oriented, appropriate affect.  Urinalysis:Not done  Mallory Shirk. MD Pgr [phone] 4:23  PM

## 2014-10-22 LAB — CYTOLOGY - PAP

## 2014-11-06 DIAGNOSIS — K559 Vascular disorder of intestine, unspecified: Secondary | ICD-10-CM

## 2014-11-06 HISTORY — DX: Vascular disorder of intestine, unspecified: K55.9

## 2015-01-05 ENCOUNTER — Other Ambulatory Visit (HOSPITAL_COMMUNITY): Payer: Self-pay | Admitting: Oncology

## 2015-01-05 DIAGNOSIS — D471 Chronic myeloproliferative disease: Secondary | ICD-10-CM

## 2015-01-05 MED ORDER — HYDROXYUREA 500 MG PO CAPS: 1000.0000 mg | ORAL_CAPSULE | Freq: Every day | ORAL | Status: DC

## 2015-01-22 DIAGNOSIS — M1712 Unilateral primary osteoarthritis, left knee: Secondary | ICD-10-CM | POA: Diagnosis not present

## 2015-02-04 DIAGNOSIS — M25562 Pain in left knee: Secondary | ICD-10-CM | POA: Diagnosis not present

## 2015-02-15 ENCOUNTER — Other Ambulatory Visit: Payer: Self-pay | Admitting: Orthopedic Surgery

## 2015-02-18 DIAGNOSIS — M1712 Unilateral primary osteoarthritis, left knee: Secondary | ICD-10-CM | POA: Diagnosis not present

## 2015-02-25 ENCOUNTER — Other Ambulatory Visit: Payer: Self-pay | Admitting: *Deleted

## 2015-02-25 MED ORDER — TRAMADOL-ACETAMINOPHEN 37.5-325 MG PO TABS: 1.0000 | ORAL_TABLET | ORAL | Status: DC | PRN

## 2015-03-01 ENCOUNTER — Other Ambulatory Visit (HOSPITAL_COMMUNITY): Payer: Self-pay | Admitting: Orthopaedic Surgery

## 2015-03-11 NOTE — Pre-Procedure Instructions (Signed)
ZOPHIA MARRONE  03/11/2015   Your procedure is scheduled on:  Monday  03/15/15  Report to [facility] Admitting at 1030 AM.  Call this number if you have problems the morning of surgery: [phone]   Remember:   Do not eat food or drink liquids after midnight.   Take these medicines the morning of surgery with A SIP OF WATER:  ATENOLOL (TENORMIN), CLONIDINE (CATAPRES), ESOMEPRAZOLE (NEXIUM), TRAMADOL IF NEEDED   (STOP ASPIRIN, MULTIVITAMIN, CELEBREX)   Do not wear jewelry, make-up or nail polish.  Do not wear lotions, powders, or perfumes. You may wear deodorant.  Do not shave 48 hours prior to surgery. Men may shave face and neck.  Do not bring valuables to the hospital.  [facility] is not responsible  for any belongings or valuables.               Contacts, dentures or bridgework may not be worn into surgery.  Leave suitcase in the car. After surgery it may be brought to your room.  For patients admitted to the hospital, discharge time is determined by your  treatment team.               Patients discharged the day of surgery will not be allowed to drive home.  Name and phone number of your driver:   Special Instructions: [place] - Preparing for Surgery  Before surgery, you can play an important role.  Because skin is not sterile, your skin needs to be as free of germs as possible.  You can reduce the number of germs on you skin by washing with CHG (chlorahexidine gluconate) soap before surgery.  CHG is an antiseptic cleaner which kills germs and bonds with the skin to continue killing germs even after washing.  Please DO NOT use if you have an allergy to CHG or antibacterial soaps.  If your skin becomes reddened/irritated stop using the CHG and inform your nurse when you arrive at Short Stay.  Do not shave (including legs and underarms) for at least 48 hours prior to the first CHG shower.  You may shave your face.  Please follow these instructions carefully:   1.   Shower with CHG Soap the night before surgery and the   morning of Surgery.  2.  If you choose to wash your hair, wash your hair first as usual with your normal shampoo.  3.  After you shampoo, rinse your hair and body thoroughly to remove the  Shampoo.  4.  Use CHG as you would any other liquid soap.  You can apply chg directly to the skin and wash gently with scrungie or a clean washcloth.  5.  Apply the CHG Soap to your body ONLY FROM THE NECK DOWN.    Do not use on open wounds or open sores.  Avoid contact with your eyes,   ears, mouth and genitals (private parts).  Wash genitals (private parts) with your normal soap.  6.  Wash thoroughly, paying special attention to the area where your surgery will be performed.  7.  Thoroughly rinse your body with warm water from the neck down.  8.  DO NOT shower/wash with your normal soap after using and rinsing off  the CHG Soap.  9.  Pat yourself dry with a clean towel.            10.  Wear clean pajamas.            11.  Place clean sheets on your bed the night of your first shower and do not sleep with pets.  Day of Surgery  Do not apply any lotions/deoderants the morning of surgery.  Please wear clean clothes to the hospital/surgery center.     Please read over the following fact sheets that you were given: Pain Booklet, Coughing and Deep Breathing, MRSA Information and Surgical Site Infection Prevention

## 2015-03-12 ENCOUNTER — Encounter (HOSPITAL_COMMUNITY)
Admission: RE | Admit: 2015-03-12 | Discharge: 2015-03-12 | Disposition: A | Discharge status: Home / Self Care | Payer: Medicare Other | Source: Ambulatory Visit | Attending: Orthopaedic Surgery | Admitting: Orthopaedic Surgery

## 2015-03-12 ENCOUNTER — Encounter (HOSPITAL_COMMUNITY): Payer: Self-pay

## 2015-03-12 DIAGNOSIS — Z01818 Encounter for other preprocedural examination: Secondary | ICD-10-CM

## 2015-03-12 DIAGNOSIS — D45 Polycythemia vera: Secondary | ICD-10-CM | POA: Diagnosis not present

## 2015-03-12 DIAGNOSIS — Z881 Allergy status to other antibiotic agents status: Secondary | ICD-10-CM | POA: Diagnosis not present

## 2015-03-12 DIAGNOSIS — Z888 Allergy status to other drugs, medicaments and biological substances status: Secondary | ICD-10-CM | POA: Diagnosis not present

## 2015-03-12 DIAGNOSIS — Z01810 Encounter for preprocedural cardiovascular examination: Secondary | ICD-10-CM

## 2015-03-12 DIAGNOSIS — I10 Essential (primary) hypertension: Secondary | ICD-10-CM

## 2015-03-12 DIAGNOSIS — M179 Osteoarthritis of knee, unspecified: Secondary | ICD-10-CM

## 2015-03-12 DIAGNOSIS — K219 Gastro-esophageal reflux disease without esophagitis: Secondary | ICD-10-CM | POA: Diagnosis not present

## 2015-03-12 DIAGNOSIS — Z01811 Encounter for preprocedural respiratory examination: Secondary | ICD-10-CM | POA: Diagnosis not present

## 2015-03-12 DIAGNOSIS — J9811 Atelectasis: Secondary | ICD-10-CM | POA: Diagnosis not present

## 2015-03-12 DIAGNOSIS — Z8542 Personal history of malignant neoplasm of other parts of uterus: Secondary | ICD-10-CM | POA: Diagnosis not present

## 2015-03-12 DIAGNOSIS — M1710 Unilateral primary osteoarthritis, unspecified knee: Secondary | ICD-10-CM

## 2015-03-12 DIAGNOSIS — C946 Myelodysplastic disease, not classified: Secondary | ICD-10-CM | POA: Diagnosis not present

## 2015-03-12 DIAGNOSIS — Z01812 Encounter for preprocedural laboratory examination: Secondary | ICD-10-CM

## 2015-03-12 DIAGNOSIS — M1712 Unilateral primary osteoarthritis, left knee: Secondary | ICD-10-CM | POA: Diagnosis not present

## 2015-03-12 DIAGNOSIS — Z6841 Body Mass Index (BMI) 40.0 and over, adult: Secondary | ICD-10-CM | POA: Diagnosis not present

## 2015-03-12 HISTORY — DX: Cardiac arrhythmia, unspecified: I49.9

## 2015-03-12 LAB — URINALYSIS, ROUTINE W REFLEX MICROSCOPIC
GLUCOSE, UA: NEGATIVE mg/dL
Hgb urine dipstick: NEGATIVE
KETONES UR: 15 mg/dL — AB
Nitrite: NEGATIVE
Protein, ur: NEGATIVE mg/dL
Specific Gravity, Urine: 1.019 (ref 1.005–1.030)
Urobilinogen, UA: 1 mg/dL (ref 0.0–1.0)
pH: 7.5 (ref 5.0–8.0)

## 2015-03-12 LAB — COMPREHENSIVE METABOLIC PANEL
ALT: 14 U/L (ref 14–54)
AST: 20 U/L (ref 15–41)
Albumin: 3.9 g/dL (ref 3.5–5.0)
Alkaline Phosphatase: 85 U/L (ref 38–126)
Anion gap: 9 (ref 5–15)
BUN: 16 mg/dL (ref 6–20)
CO2: 32 mmol/L (ref 22–32)
Calcium: 9.6 mg/dL (ref 8.9–10.3)
Chloride: 98 mmol/L — ABNORMAL LOW (ref 101–111)
Creatinine, Ser: 0.88 mg/dL (ref 0.44–1.00)
GFR calc Af Amer: >60 mL/min (ref >60)
GFR calc non Af Amer: >60 mL/min (ref >60)
GLUCOSE: 100 mg/dL — AB (ref 70–99)
Potassium: 4 mmol/L (ref 3.5–5.1)
SODIUM: 139 mmol/L (ref 135–145)
TOTAL PROTEIN: 7.7 g/dL (ref 6.5–8.1)
Total Bilirubin: 0.8 mg/dL (ref 0.3–1.2)

## 2015-03-12 LAB — PROTIME-INR
INR: 1.01 (ref 0.00–1.49)
PROTHROMBIN TIME: 13.4 s (ref 11.6–15.2)

## 2015-03-12 LAB — CBC
HCT: 41.5 % (ref 36.0–46.0)
Hemoglobin: 14.5 g/dL (ref 12.0–15.0)
MCH: 38.4 pg — ABNORMAL HIGH (ref 26.0–34.0)
MCHC: 34.9 g/dL (ref 30.0–36.0)
MCV: 109.8 fL — ABNORMAL HIGH (ref 78.0–100.0)
PLATELETS: 272 10*3/uL (ref 150–400)
RBC: 3.78 MIL/uL — ABNORMAL LOW (ref 3.87–5.11)
RDW: 13.7 % (ref 11.5–15.5)
WBC: 8.2 10*3/uL (ref 4.0–10.5)

## 2015-03-12 LAB — URINE MICROSCOPIC-ADD ON

## 2015-03-12 LAB — SURGICAL PCR SCREEN
MRSA, PCR: NEGATIVE
STAPHYLOCOCCUS AUREUS: NEGATIVE

## 2015-03-12 LAB — APTT: aPTT: 32 seconds (ref 24–37)

## 2015-03-12 NOTE — Progress Notes (Addendum)
Anesthesia PAT Evaluation: Patient is a 74 year old female scheduled for left TKA on 03/15/15 by Dr. Lorin Mercy.  Anesthesia type is posted as spinal, however, anesthesiologist Dr Linna Caprice and I evaluated patient at PAT, and GA was recommended due to her inability to lie flat.  She also expressed some concern that she would have an anxiety attack during surgery if she was not completely under anesthesia.  History includes non-smoker, polycythemia vera, myeloproliferative disorder JAK-2 positive, HTN, GERD, hiatal hernia, murmur (trace MR, mild TR '13 echo), dysrhythmia without mention of afib, endometrial cancer s/p hysterectomy '12, SOB (unable to lie flat). PCP is Dr. [name].  She has seen pulmonologist Dr. Luan Pulling in the past, but not in ~ 2 years.  HEM-ONC was Dr. Barnet Glasgow (relocated?), but has appointment with Dr. Whitney Muse later this month.     For anesthesia history she reports post-operative N/V and post-operative hypoxic respiratory failure following hysterectomy done under GA 09/2011.  She was seen by PCCM (Dr. Melvyn Novas) and Hospitalist.  Treated with Bi-PAP, nebulizers, Lasix, Unasyn-->Avelox. Respiratory failure felt multi-factorial with differential including negative pressure pulmonary edema, aspiration PNA (had significant N/V in PACU), COPD exacerbation, CHF (NL LVEF, grade 1 diastolic dysfunction), COPD exacerbation, and obestiy hypoventilation syndrome.  She has never been evaluated for OSA. CXR shows chronic elevation of right hemi-diaphragm.  She is also morbidly obese (BMI 46.8). She also said that she had a colonoscopy at [facility] in the past under sedation and "the medication didn't work."    Meds include ASA, atenolol, clonidine, Unisom, Nexium, HCTZ, hydroxyurea, losartan, simvastatin, Ultracet.   03/12/15 EKG: NSR, LVH. I think EKG appears stable since 08/08/11.  07/23/12 Echo: Study Conclusions - Left ventricle: The cavity size was normal. There was moderate concentric hypertrophy. Systolic  function was normal. The estimated ejection fraction was in the range of 60% to 65%. Doppler parameters are consistent with abnormal left ventricular relaxation (grade 1 diastolic dysfunction). The E/e' ratio is <10, suggesting normal LV filling pressure. - Mitral valve: Calcified annulus (mostly posterior). Trivial regurgitation. No stenosis. - Left atrium: Moderately dilated (38 ml/m2) - Tricuspid valve: Mild regurgitation. - Pulmonic valve: Trivial regurgitation. - Pulmonary arteries: PA peak pressure: 31mm Hg (S). - Inferior vena cava: The vessel was normal in size; the respirophasic diameter changes were in the normal range (=50%); findings are consistent with normal central venous pressure. - Pericardium, extracardiac: There was no pericardial effusion.  06/20/12 Carotid duplex: IMPRESSION: Less than 50% stenosis in the right and left internal carotid arteries.  03/12/15 CXR: No active disease. Chronic elevation of the right hemidiaphragm again noted. Mild basilar atelectasis. Stable linear atelectasis or scarring in lingula.  03/13/11 PFTs: FVC 1.84 (69%), FEV1 1.31 (61%), DLCO 19.4 (73%) (DLCO may be underestimated, patient unable to meet desired inspiratory volume). Mild response to bronchodilator.   Preoperative labs noted.   Exam shows a pleasant Caucasian female in NAD.  Walks with a rolling walker. Denies CP and SOB at rest.  Has stable DOE with mild-moderate activity.  She cannot lie flat due to SOB, and sleeps on 2 pillows.  Has chronic BLE edema. Heart RRR, no murmur noted.  Lungs clear.  No carotid bruits noted. BLE large, + generalized edema.  As above, patient seen at PAT.  She was initially interested in spinal anesthesia due to her history of N/V and post-operative respiratory failure. Dr. Lorin Mercy told her he thought procedure could be done with spinal/epidural anesthesia.  Patient later concerned about potential for anxiety if  she was aware during  surgery.  I also expressed concern about spinal anesthesia due to her inability to lie flat.  I discussed this with anesthesiologist Dr. Linna Caprice as well as her last post-operative respiratory complications.  He came to see patient.  He felt she would be a better candidate for GA, and patient said she actually felt relieved. I left a voice message with Malachy Mood at Dr. Lorin Mercy' office.  Further evaluation by her assigned anesthesiologist on the day of surgery.  George Hugh [facility] Short Stay Center/Anesthesiology Phone [phone] 03/12/2015 2:34 PM

## 2015-03-12 NOTE — Progress Notes (Signed)
PCP is Dr [name] Denies seeing a cardiologist Echo noted in epic from 2013 Denies any chest pain. Denies having a card cath, and is unsure if she had a stress test, but states if she did it was done here or an [facility]. Pt voices concern about "being put to sleep" States Dr Lorin Mercy told her he would do an epidural.  Informed pt she would see the Anesthesiologist on the day of her surgery. Ebony Hail called and will review her chart and see pt if needed. Pt states that she would like to speak with her.

## 2015-03-14 MED ORDER — DEXTROSE 5 % IV SOLN
3.0000 g | INTRAVENOUS | Status: AC
  Administered 2015-03-15: 3 g via INTRAVENOUS
  Filled 2015-03-14: qty 3000

## 2015-03-15 ENCOUNTER — Inpatient Hospital Stay (HOSPITAL_COMMUNITY)
Admission: RE | Admit: 2015-03-15 | Discharge: 2015-03-18 | DRG: 470 | Disposition: A | Discharge status: Skilled Nursing Facility | Payer: Medicare Other | Source: Ambulatory Visit | Attending: Orthopaedic Surgery | Admitting: Orthopaedic Surgery

## 2015-03-15 ENCOUNTER — Encounter (HOSPITAL_COMMUNITY)
Admission: RE | Disposition: A | Discharge status: Skilled Nursing Facility | Payer: Self-pay | Source: Ambulatory Visit | Attending: Orthopaedic Surgery

## 2015-03-15 ENCOUNTER — Encounter (HOSPITAL_COMMUNITY): Payer: Self-pay | Admitting: Anesthesiology

## 2015-03-15 ENCOUNTER — Inpatient Hospital Stay (HOSPITAL_COMMUNITY): Payer: Medicare Other | Admitting: Vascular Surgery

## 2015-03-15 ENCOUNTER — Inpatient Hospital Stay (HOSPITAL_COMMUNITY): Payer: Medicare Other | Admitting: Anesthesiology

## 2015-03-15 DIAGNOSIS — D471 Chronic myeloproliferative disease: Secondary | ICD-10-CM | POA: Diagnosis not present

## 2015-03-15 DIAGNOSIS — M1712 Unilateral primary osteoarthritis, left knee: Principal | ICD-10-CM | POA: Diagnosis present

## 2015-03-15 DIAGNOSIS — C946 Myelodysplastic disease, not classified: Secondary | ICD-10-CM | POA: Diagnosis present

## 2015-03-15 DIAGNOSIS — K449 Diaphragmatic hernia without obstruction or gangrene: Secondary | ICD-10-CM | POA: Diagnosis not present

## 2015-03-15 DIAGNOSIS — M189 Osteoarthritis of first carpometacarpal joint, unspecified: Secondary | ICD-10-CM | POA: Diagnosis not present

## 2015-03-15 DIAGNOSIS — I10 Essential (primary) hypertension: Secondary | ICD-10-CM | POA: Diagnosis present

## 2015-03-15 DIAGNOSIS — J42 Unspecified chronic bronchitis: Secondary | ICD-10-CM | POA: Diagnosis not present

## 2015-03-15 DIAGNOSIS — D45 Polycythemia vera: Secondary | ICD-10-CM | POA: Diagnosis present

## 2015-03-15 DIAGNOSIS — Z888 Allergy status to other drugs, medicaments and biological substances status: Secondary | ICD-10-CM | POA: Diagnosis not present

## 2015-03-15 DIAGNOSIS — Z6841 Body Mass Index (BMI) 40.0 and over, adult: Secondary | ICD-10-CM

## 2015-03-15 DIAGNOSIS — M6281 Muscle weakness (generalized): Secondary | ICD-10-CM | POA: Diagnosis not present

## 2015-03-15 DIAGNOSIS — K219 Gastro-esophageal reflux disease without esophagitis: Secondary | ICD-10-CM | POA: Diagnosis present

## 2015-03-15 DIAGNOSIS — Z881 Allergy status to other antibiotic agents status: Secondary | ICD-10-CM | POA: Diagnosis not present

## 2015-03-15 DIAGNOSIS — Z96652 Presence of left artificial knee joint: Secondary | ICD-10-CM | POA: Diagnosis not present

## 2015-03-15 DIAGNOSIS — M1000 Idiopathic gout, unspecified site: Secondary | ICD-10-CM | POA: Diagnosis not present

## 2015-03-15 DIAGNOSIS — Z8542 Personal history of malignant neoplasm of other parts of uterus: Secondary | ICD-10-CM | POA: Diagnosis not present

## 2015-03-15 DIAGNOSIS — M1990 Unspecified osteoarthritis, unspecified site: Secondary | ICD-10-CM | POA: Diagnosis not present

## 2015-03-15 DIAGNOSIS — R262 Difficulty in walking, not elsewhere classified: Secondary | ICD-10-CM | POA: Diagnosis not present

## 2015-03-15 DIAGNOSIS — R278 Other lack of coordination: Secondary | ICD-10-CM | POA: Diagnosis not present

## 2015-03-15 DIAGNOSIS — Z9981 Dependence on supplemental oxygen: Secondary | ICD-10-CM | POA: Diagnosis not present

## 2015-03-15 DIAGNOSIS — M179 Osteoarthritis of knee, unspecified: Secondary | ICD-10-CM | POA: Diagnosis not present

## 2015-03-15 DIAGNOSIS — M25562 Pain in left knee: Secondary | ICD-10-CM | POA: Diagnosis not present

## 2015-03-15 HISTORY — PX: KNEE ARTHROPLASTY: SHX992

## 2015-03-15 SURGERY — ARTHROPLASTY, KNEE, TOTAL, USING IMAGELESS COMPUTER-ASSISTED NAVIGATION
Anesthesia: General | Site: Knee | Laterality: Left

## 2015-03-15 MED ORDER — LIDOCAINE HCL (CARDIAC) 20 MG/ML IV SOLN
INTRAVENOUS | Status: AC
  Filled 2015-03-15: qty 5

## 2015-03-15 MED ORDER — ASPIRIN EC 325 MG PO TBEC
325.0000 mg | DELAYED_RELEASE_TABLET | Freq: Every day | ORAL | Status: DC
  Administered 2015-03-16 – 2015-03-18 (×3): 325 mg via ORAL
  Filled 2015-03-15 (×3): qty 1

## 2015-03-15 MED ORDER — MENTHOL 3 MG MT LOZG: 1.0000 | LOZENGE | OROMUCOSAL | Status: DC | PRN

## 2015-03-15 MED ORDER — POTASSIUM CHLORIDE IN NACL 20-0.45 MEQ/L-% IV SOLN
INTRAVENOUS | Status: DC
  Filled 2015-03-15 (×7): qty 1000

## 2015-03-15 MED ORDER — PROPOFOL 10 MG/ML IV BOLUS
INTRAVENOUS | Status: AC
  Filled 2015-03-15: qty 20

## 2015-03-15 MED ORDER — CLONIDINE HCL 0.1 MG PO TABS
0.1500 mg | ORAL_TABLET | Freq: Two times a day (BID) | ORAL | Status: DC
  Administered 2015-03-16 – 2015-03-18 (×5): 0.15 mg via ORAL
  Filled 2015-03-15 (×5): qty 2

## 2015-03-15 MED ORDER — ACETAMINOPHEN 325 MG PO TABS: 650.0000 mg | ORAL_TABLET | Freq: Four times a day (QID) | ORAL | Status: DC | PRN

## 2015-03-15 MED ORDER — CHLORHEXIDINE GLUCONATE 4 % EX LIQD
60.0000 mL | Freq: Once | CUTANEOUS | Status: DC
  Filled 2015-03-15: qty 60

## 2015-03-15 MED ORDER — FENTANYL CITRATE (PF) 100 MCG/2ML IJ SOLN
INTRAMUSCULAR | Status: AC
  Administered 2015-03-15: 100 ug
  Filled 2015-03-15: qty 2

## 2015-03-15 MED ORDER — PROPOFOL 10 MG/ML IV BOLUS
INTRAVENOUS | Status: DC | PRN
  Administered 2015-03-15: 150 mg via INTRAVENOUS

## 2015-03-15 MED ORDER — NEOSTIGMINE METHYLSULFATE 10 MG/10ML IV SOLN
INTRAVENOUS | Status: DC | PRN
Start: 2015-03-15 — End: 2015-03-15
  Administered 2015-03-15: 3 mg via INTRAVENOUS

## 2015-03-15 MED ORDER — NEOSTIGMINE METHYLSULFATE 10 MG/10ML IV SOLN
INTRAVENOUS | Status: AC
  Filled 2015-03-15: qty 1

## 2015-03-15 MED ORDER — LACTATED RINGERS IV SOLN
INTRAVENOUS | Status: DC
  Administered 2015-03-15: 10:00:00 via INTRAVENOUS

## 2015-03-15 MED ORDER — LOSARTAN POTASSIUM 50 MG PO TABS
100.0000 mg | ORAL_TABLET | Freq: Every day | ORAL | Status: DC
  Administered 2015-03-16 – 2015-03-18 (×3): 100 mg via ORAL
  Filled 2015-03-15 (×3): qty 2

## 2015-03-15 MED ORDER — PANTOPRAZOLE SODIUM 40 MG PO TBEC
40.0000 mg | DELAYED_RELEASE_TABLET | Freq: Every day | ORAL | Status: DC
  Administered 2015-03-16 – 2015-03-18 (×3): 40 mg via ORAL
  Filled 2015-03-15 (×4): qty 1

## 2015-03-15 MED ORDER — ATENOLOL 50 MG PO TABS
25.0000 mg | ORAL_TABLET | Freq: Two times a day (BID) | ORAL | Status: DC
  Administered 2015-03-16 – 2015-03-18 (×5): 25 mg via ORAL
  Filled 2015-03-15 (×5): qty 1

## 2015-03-15 MED ORDER — LACTATED RINGERS IV SOLN
INTRAVENOUS | Status: DC | PRN
  Administered 2015-03-15 (×2): via INTRAVENOUS

## 2015-03-15 MED ORDER — PROMETHAZINE HCL 25 MG/ML IJ SOLN
25.0000 mg | INTRAMUSCULAR | Status: AC
  Administered 2015-03-15: 12.5 mg via INTRAVENOUS
  Filled 2015-03-15: qty 1

## 2015-03-15 MED ORDER — ONDANSETRON HCL 4 MG PO TABS: 4.0000 mg | ORAL_TABLET | Freq: Four times a day (QID) | ORAL | Status: DC | PRN

## 2015-03-15 MED ORDER — GLYCOPYRROLATE 0.2 MG/ML IJ SOLN
INTRAMUSCULAR | Status: AC
  Filled 2015-03-15: qty 2

## 2015-03-15 MED ORDER — ONDANSETRON HCL 4 MG/2ML IJ SOLN
INTRAMUSCULAR | Status: DC | PRN
  Administered 2015-03-15 (×3): 4 mg via INTRAVENOUS

## 2015-03-15 MED ORDER — HYDROCHLOROTHIAZIDE 25 MG PO TABS
25.0000 mg | ORAL_TABLET | ORAL | Status: DC
  Administered 2015-03-16 – 2015-03-18 (×3): 25 mg via ORAL
  Filled 2015-03-15 (×3): qty 1

## 2015-03-15 MED ORDER — METOCLOPRAMIDE HCL 5 MG/ML IJ SOLN
5.0000 mg | Freq: Three times a day (TID) | INTRAMUSCULAR | Status: DC | PRN
  Filled 2015-03-15: qty 2

## 2015-03-15 MED ORDER — 0.9 % SODIUM CHLORIDE (POUR BTL) OPTIME
TOPICAL | Status: DC | PRN
  Administered 2015-03-15: 1000 mL

## 2015-03-15 MED ORDER — FENTANYL CITRATE (PF) 250 MCG/5ML IJ SOLN
INTRAMUSCULAR | Status: AC
  Filled 2015-03-15: qty 5

## 2015-03-15 MED ORDER — SIMVASTATIN 10 MG PO TABS
10.0000 mg | ORAL_TABLET | Freq: Every day | ORAL | Status: DC
  Administered 2015-03-16 – 2015-03-17 (×2): 10 mg via ORAL
  Filled 2015-03-15 (×2): qty 1

## 2015-03-15 MED ORDER — ROCURONIUM BROMIDE 50 MG/5ML IV SOLN
INTRAVENOUS | Status: AC
  Filled 2015-03-15: qty 1

## 2015-03-15 MED ORDER — HYDROXYUREA 500 MG PO CAPS
1000.0000 mg | ORAL_CAPSULE | Freq: Every day | ORAL | Status: DC
  Administered 2015-03-16 – 2015-03-18 (×3): 1000 mg via ORAL
  Filled 2015-03-15 (×3): qty 2

## 2015-03-15 MED ORDER — DILTIAZEM HCL ER COATED BEADS 180 MG PO CP24
180.0000 mg | ORAL_CAPSULE | Freq: Every day | ORAL | Status: DC
  Administered 2015-03-15 – 2015-03-17 (×3): 180 mg via ORAL
  Filled 2015-03-15 (×3): qty 1

## 2015-03-15 MED ORDER — ONDANSETRON HCL 4 MG/2ML IJ SOLN
4.0000 mg | Freq: Four times a day (QID) | INTRAMUSCULAR | Status: DC | PRN
  Administered 2015-03-16 (×2): 4 mg via INTRAVENOUS
  Filled 2015-03-15 (×2): qty 2

## 2015-03-15 MED ORDER — FENTANYL CITRATE (PF) 100 MCG/2ML IJ SOLN
INTRAMUSCULAR | Status: DC | PRN
  Administered 2015-03-15 (×2): 50 ug via INTRAVENOUS
  Administered 2015-03-15: 150 ug via INTRAVENOUS

## 2015-03-15 MED ORDER — PHENYLEPHRINE HCL 10 MG/ML IJ SOLN
20.0000 mg | INTRAVENOUS | Status: DC | PRN
  Administered 2015-03-15: 10 ug/min via INTRAVENOUS

## 2015-03-15 MED ORDER — SODIUM CHLORIDE 0.9 % IR SOLN
Status: DC | PRN
  Administered 2015-03-15: 3000 mL

## 2015-03-15 MED ORDER — METHOCARBAMOL 500 MG PO TABS
500.0000 mg | ORAL_TABLET | Freq: Four times a day (QID) | ORAL | Status: DC | PRN
  Administered 2015-03-16 – 2015-03-18 (×4): 500 mg via ORAL
  Filled 2015-03-15 (×5): qty 1

## 2015-03-15 MED ORDER — ROCURONIUM BROMIDE 100 MG/10ML IV SOLN
INTRAVENOUS | Status: DC | PRN
  Administered 2015-03-15: 20 mg via INTRAVENOUS

## 2015-03-15 MED ORDER — LIDOCAINE HCL (CARDIAC) 20 MG/ML IV SOLN
INTRAVENOUS | Status: DC | PRN
  Administered 2015-03-15: 80 mg via INTRAVENOUS

## 2015-03-15 MED ORDER — ONDANSETRON HCL 4 MG/2ML IJ SOLN
INTRAMUSCULAR | Status: AC
  Filled 2015-03-15: qty 2

## 2015-03-15 MED ORDER — SENNOSIDES-DOCUSATE SODIUM 8.6-50 MG PO TABS
1.0000 | ORAL_TABLET | Freq: Every evening | ORAL | Status: DC | PRN
  Administered 2015-03-17: 1 via ORAL
  Filled 2015-03-15: qty 1

## 2015-03-15 MED ORDER — BUPIVACAINE HCL (PF) 0.25 % IJ SOLN
INTRAMUSCULAR | Status: AC
  Filled 2015-03-15: qty 30

## 2015-03-15 MED ORDER — BUPIVACAINE HCL (PF) 0.25 % IJ SOLN
INTRAMUSCULAR | Status: DC | PRN
  Administered 2015-03-15: 10 mL

## 2015-03-15 MED ORDER — ONDANSETRON HCL 4 MG/2ML IJ SOLN
INTRAMUSCULAR | Status: AC
  Filled 2015-03-15: qty 4

## 2015-03-15 MED ORDER — PHENOL 1.4 % MT LIQD: 1.0000 | OROMUCOSAL | Status: DC | PRN

## 2015-03-15 MED ORDER — ONDANSETRON HCL 4 MG/2ML IJ SOLN: 4.0000 mg | Freq: Once | INTRAMUSCULAR | Status: DC | PRN

## 2015-03-15 MED ORDER — DOCUSATE SODIUM 100 MG PO CAPS
100.0000 mg | ORAL_CAPSULE | Freq: Two times a day (BID) | ORAL | Status: DC
  Administered 2015-03-15 – 2015-03-18 (×6): 100 mg via ORAL
  Filled 2015-03-15 (×6): qty 1

## 2015-03-15 MED ORDER — HYDROCODONE-ACETAMINOPHEN 10-325 MG PO TABS
1.0000 | ORAL_TABLET | ORAL | Status: DC | PRN
  Administered 2015-03-15 – 2015-03-17 (×6): 2 via ORAL
  Administered 2015-03-17 – 2015-03-18 (×3): 1 via ORAL
  Administered 2015-03-18: 2 via ORAL
  Filled 2015-03-15: qty 2
  Filled 2015-03-15: qty 1
  Filled 2015-03-15 (×2): qty 2
  Filled 2015-03-15: qty 1
  Filled 2015-03-15: qty 2
  Filled 2015-03-15: qty 1
  Filled 2015-03-15 (×3): qty 2

## 2015-03-15 MED ORDER — DEXAMETHASONE SODIUM PHOSPHATE 10 MG/ML IJ SOLN
INTRAMUSCULAR | Status: DC | PRN
  Administered 2015-03-15: 4 mg via INTRAVENOUS

## 2015-03-15 MED ORDER — HYDROMORPHONE HCL 1 MG/ML IJ SOLN
0.5000 mg | INTRAMUSCULAR | Status: AC | PRN
  Administered 2015-03-15 (×2): 0.25 mg via INTRAVENOUS
  Administered 2015-03-15 (×2): 0.5 mg via INTRAVENOUS

## 2015-03-15 MED ORDER — METHOCARBAMOL 1000 MG/10ML IJ SOLN
500.0000 mg | Freq: Four times a day (QID) | INTRAVENOUS | Status: DC | PRN
  Filled 2015-03-15 (×2): qty 5

## 2015-03-15 MED ORDER — ACETAMINOPHEN 650 MG RE SUPP: 650.0000 mg | Freq: Four times a day (QID) | RECTAL | Status: DC | PRN

## 2015-03-15 MED ORDER — HYDROMORPHONE HCL 1 MG/ML IJ SOLN
INTRAMUSCULAR | Status: AC
  Filled 2015-03-15: qty 1

## 2015-03-15 MED ORDER — METOCLOPRAMIDE HCL 5 MG PO TABS
5.0000 mg | ORAL_TABLET | Freq: Three times a day (TID) | ORAL | Status: DC | PRN
  Administered 2015-03-16: 10 mg via ORAL

## 2015-03-15 MED ORDER — GLYCOPYRROLATE 0.2 MG/ML IJ SOLN
INTRAMUSCULAR | Status: DC | PRN
  Administered 2015-03-15: .4 mg via INTRAVENOUS

## 2015-03-15 MED ORDER — CEFAZOLIN SODIUM 1-5 GM-% IV SOLN
1.0000 g | Freq: Four times a day (QID) | INTRAVENOUS | Status: AC
  Administered 2015-03-16: 1 g via INTRAVENOUS
  Filled 2015-03-15 (×2): qty 50

## 2015-03-15 MED ORDER — HYDROMORPHONE HCL 1 MG/ML IJ SOLN
1.0000 mg | INTRAMUSCULAR | Status: DC | PRN
  Administered 2015-03-15 – 2015-03-17 (×4): 1 mg via INTRAVENOUS
  Filled 2015-03-15 (×4): qty 1

## 2015-03-15 MED ORDER — SUCCINYLCHOLINE CHLORIDE 20 MG/ML IJ SOLN
INTRAMUSCULAR | Status: AC
  Filled 2015-03-15: qty 1

## 2015-03-15 SURGICAL SUPPLY — 64 items
BANDAGE ELASTIC 4 VELCRO ST LF (GAUZE/BANDAGES/DRESSINGS) ×3 IMPLANT
BANDAGE ESMARK 6X9 LF (GAUZE/BANDAGES/DRESSINGS) ×1 IMPLANT
BENZOIN TINCTURE PRP APPL 2/3 (GAUZE/BANDAGES/DRESSINGS) ×3 IMPLANT
BLADE SAGITTAL 25.0X1.19X90 (BLADE) ×2 IMPLANT
BLADE SAGITTAL 25.0X1.19X90MM (BLADE) ×1
BLADE SAW SGTL 13X75X1.27 (BLADE) ×3 IMPLANT
BNDG ELASTIC 6X10 VLCR STRL LF (GAUZE/BANDAGES/DRESSINGS) IMPLANT
BNDG ESMARK 6X9 LF (GAUZE/BANDAGES/DRESSINGS) ×3
BOWL SMART MIX CTS (DISPOSABLE) ×3 IMPLANT
CAP KNEE TOTAL 3 SIGMA ×3 IMPLANT
CEMENT HV SMART SET (Cement) ×6 IMPLANT
CLOSURE WOUND 1/2 X4 (GAUZE/BANDAGES/DRESSINGS) ×2
COVER SURGICAL LIGHT HANDLE (MISCELLANEOUS) ×3 IMPLANT
CUFF TOURNIQUET SINGLE 34IN LL (TOURNIQUET CUFF) ×3 IMPLANT
CUFF TOURNIQUET SINGLE 44IN (TOURNIQUET CUFF) ×3 IMPLANT
DRAPE ORTHO SPLIT 77X108 STRL (DRAPES) ×4
DRAPE SURG ORHT 6 SPLT 77X108 (DRAPES) ×2 IMPLANT
DRAPE U-SHAPE 47X51 STRL (DRAPES) ×3 IMPLANT
DRSG PAD ABDOMINAL 8X10 ST (GAUZE/BANDAGES/DRESSINGS) ×6 IMPLANT
DURAPREP 26ML APPLICATOR (WOUND CARE) ×6 IMPLANT
ELECT REM PT RETURN 9FT ADLT (ELECTROSURGICAL) ×3
ELECTRODE REM PT RTRN 9FT ADLT (ELECTROSURGICAL) ×1 IMPLANT
FACESHIELD WRAPAROUND (MASK) ×9 IMPLANT
GAUZE SPONGE 4X4 12PLY STRL (GAUZE/BANDAGES/DRESSINGS) ×6 IMPLANT
GAUZE XEROFORM 5X9 LF (GAUZE/BANDAGES/DRESSINGS) IMPLANT
GLOVE BIOGEL PI IND STRL 8 (GLOVE) ×2 IMPLANT
GLOVE BIOGEL PI INDICATOR 8 (GLOVE) ×4
GLOVE ORTHO TXT STRL SZ7.5 (GLOVE) ×6 IMPLANT
GOWN STRL REUS W/ TWL LRG LVL3 (GOWN DISPOSABLE) ×1 IMPLANT
GOWN STRL REUS W/ TWL XL LVL3 (GOWN DISPOSABLE) ×1 IMPLANT
GOWN STRL REUS W/TWL 2XL LVL3 (GOWN DISPOSABLE) ×3 IMPLANT
GOWN STRL REUS W/TWL LRG LVL3 (GOWN DISPOSABLE) ×2
GOWN STRL REUS W/TWL XL LVL3 (GOWN DISPOSABLE) ×2
HANDPIECE INTERPULSE COAX TIP (DISPOSABLE) ×2
IMMOBILIZER KNEE 22 UNIV (SOFTGOODS) ×3 IMPLANT
KIT BASIN OR (CUSTOM PROCEDURE TRAY) ×3 IMPLANT
KIT ROOM TURNOVER OR (KITS) ×3 IMPLANT
MANIFOLD NEPTUNE II (INSTRUMENTS) ×3 IMPLANT
MARKER SPHERE PSV REFLC THRD 5 (MARKER) ×9 IMPLANT
NEEDLE HYPO 25GX1X1/2 BEV (NEEDLE) ×6 IMPLANT
NS IRRIG 1000ML POUR BTL (IV SOLUTION) ×3 IMPLANT
PACK TOTAL JOINT (CUSTOM PROCEDURE TRAY) ×3 IMPLANT
PACK UNIVERSAL I (CUSTOM PROCEDURE TRAY) ×3 IMPLANT
PAD ARMBOARD 7.5X6 YLW CONV (MISCELLANEOUS) ×6 IMPLANT
PAD CAST 4YDX4 CTTN HI CHSV (CAST SUPPLIES) ×1 IMPLANT
PADDING CAST COTTON 4X4 STRL (CAST SUPPLIES) ×2
PADDING CAST COTTON 6X4 STRL (CAST SUPPLIES) ×6 IMPLANT
PIN SCHANZ 4MM 130MM (PIN) ×12 IMPLANT
SET HNDPC FAN SPRY TIP SCT (DISPOSABLE) ×1 IMPLANT
STAPLER VISISTAT 35W (STAPLE) IMPLANT
STRIP CLOSURE SKIN 1/2X4 (GAUZE/BANDAGES/DRESSINGS) ×4 IMPLANT
SUCTION FRAZIER TIP 10 FR DISP (SUCTIONS) ×3 IMPLANT
SUT ETHILON 3 0 PS 1 (SUTURE) ×3 IMPLANT
SUT VIC AB 0 CT1 27 (SUTURE) ×2
SUT VIC AB 0 CT1 27XBRD ANBCTR (SUTURE) ×1 IMPLANT
SUT VIC AB 1 CTX 36 (SUTURE) ×4
SUT VIC AB 1 CTX36XBRD ANBCTR (SUTURE) ×2 IMPLANT
SUT VIC AB 2-0 CT1 27 (SUTURE) ×6
SUT VIC AB 2-0 CT1 TAPERPNT 27 (SUTURE) ×3 IMPLANT
SUT VIC AB 3-0 X1 27 (SUTURE) ×3 IMPLANT
SYR CONTROL 10ML LL (SYRINGE) ×6 IMPLANT
TOWEL OR 17X24 6PK STRL BLUE (TOWEL DISPOSABLE) ×3 IMPLANT
TOWEL OR 17X26 10 PK STRL BLUE (TOWEL DISPOSABLE) ×3 IMPLANT
WATER STERILE IRR 1000ML POUR (IV SOLUTION) ×3 IMPLANT

## 2015-03-15 NOTE — Transfer of Care (Signed)
Immediate Anesthesia Transfer of Care Note  Patient: [name]  Procedure(s) Performed: Procedure(s): COMPUTER ASSISTED TOTAL KNEE ARTHROPLASTY (Left)  Patient Location: PACU  Anesthesia Type:General  Level of Consciousness: awake, sedated and patient cooperative  Airway & Oxygen Therapy: Patient Spontanous Breathing and Patient connected to face mask oxygen  Post-op Assessment: Report given to RN, Post -op Vital signs reviewed and stable and Patient moving all extremities  Post vital signs: Reviewed and stable  Last Vitals:  Filed Vitals:   03/15/15 1530  BP:   Pulse:   Temp: 36.3 C  Resp:     Complications: No apparent anesthesia complications

## 2015-03-15 NOTE — Progress Notes (Signed)
Orthopedic Tech Progress Note Patient Details:  [name] 11-06-1941 [id number]  CPM Left Knee CPM Left Knee: On Left Knee Flexion (Degrees): 90 Left Knee Extension (Degrees): 0 Additional Comments: trapeze bar patient helper Viewed order from doctor's order list  Hildred Priest 03/15/2015, 4:14 PM

## 2015-03-15 NOTE — Anesthesia Preprocedure Evaluation (Addendum)
Anesthesia Evaluation  Patient identified by MRN, date of birth, ID band Patient awake    Reviewed: Allergy & Precautions, NPO status , Patient's Chart, lab work & pertinent test results  Airway Mallampati: II       Dental   Pulmonary shortness of breath, COPD + rhonchi   + decreased breath sounds      Cardiovascular hypertension, Normal cardiovascular exam+ dysrhythmias     Neuro/Psych    GI/Hepatic hiatal hernia, GERD-  ,  Endo/Other  Morbid obesity  Renal/GU      Musculoskeletal  (+) Arthritis -,   Abdominal   Peds  Hematology   Anesthesia Other Findings   Reproductive/Obstetrics                            Anesthesia Physical Anesthesia Plan  ASA: III  Anesthesia Plan: General   Post-op Pain Management:    Induction: Intravenous  Airway Management Planned: Oral ETT  Additional Equipment:   Intra-op Plan:   Post-operative Plan: Extubation in OR  Informed Consent: I have reviewed the patients History and Physical, chart, labs and discussed the procedure including the risks, benefits and alternatives for the proposed anesthesia with the patient or authorized representative who has indicated his/her understanding and acceptance.     Plan Discussed with: CRNA, Anesthesiologist and Surgeon  Anesthesia Plan Comments:         Anesthesia Quick Evaluation

## 2015-03-15 NOTE — H&P (Signed)
TOTAL KNEE ADMISSION H&P  Patient is being admitted for left total knee arthroplasty.  Subjective:  Chief Complaint:left knee pain.  HPI: [name], 74 y.o. female, has a history of pain and functional disability in the left knee due to arthritis and has failed non-surgical conservative treatments for greater than 12 weeks to includeNSAID's and/or analgesics, corticosteriod injections, use of assistive devices and activity modification.  Onset of symptoms was gradual, starting 5 years ago with gradually worsening course since that time. The patient noted no past surgery on the left knee(s).  Patient currently rates pain in the left knee(s) at 10 out of 10 with activity. Patient has night pain, worsening of pain with activity and weight bearing, pain that interferes with activities of daily living, crepitus and joint swelling.  Patient has evidence of subchondral cysts, subchondral sclerosis, periarticular osteophytes and joint space narrowing by imaging studies. . There is no active infection.  Patient Active Problem List   Diagnosis Date Noted  . Myeloproliferative disorder, JAK-2 positive 01/05/2014  . Thrombocytosis 12/19/2013  . CMC arthritis, thumb, degenerative 06/03/2013  . Bone, giant cell tumor 06/03/2013  . GERD (gastroesophageal reflux disease) 11/19/2012  . Hiatal hernia 11/19/2012  . Acute respiratory failure 09/13/2011  . Bilateral pulmonary infiltrates on chest x-ray 09/13/2011  . Leukocytosis 09/13/2011  . Hyperglycemia 09/13/2011  . Endometrial cancer 09/12/2011  . Chronic lung disease 08/16/2011    Class: Chronic  . KNEE, ARTHRITIS, DEGEN./OSTEO 05/24/2010   Past Medical History  Diagnosis Date  . Arthritis   . Hypertension   . GERD (gastroesophageal reflux disease)   . Obesity, morbid (more than 100 lbs over ideal weight or BMI > 40) 08/2011    Ht 5'7", wt 280 lb  . Bronchitis, chronic obstructive since 2000's  . Shortness of breath     unable to lay flat  due to dyspnea  . Hiatal hernia   . Hiatal hernia 11/19/2012  . Cancer     dx with endometrial cancer   . Heart murmur   . Myeloproliferative disorder, JAK-2 positive 01/05/2014  . Polycythemia vera(238.4) 01/18/2014  . Gout   . Pneumonia 09/2014  . Dysrhythmia   . PONV (postoperative nausea and vomiting)     post-op hypoxic resp failure 09/2011 and require Bi-Pap (possible r/t - pressure pulm edema vs asp PNA, COPD exacerbation, obesity hypoventilation    Past Surgical History  Procedure Laterality Date  . Hysteroscopy w/d&c  08/15/2011    Procedure: DILATATION AND CURETTAGE (D&C) /HYSTEROSCOPY;  Surgeon: Jonnie Kind, MD;  Location: [facility];  Service: Gynecology;  Laterality: N/A;  With Suction Curette  . Abdominal hysterectomy  09/12/2011    Procedure: HYSTERECTOMY ABDOMINAL;  Surgeon: Janie Morning, MD;  Location: [facility];  Service: Gynecology;  Laterality: N/A;  Total Abdominal Hysterectomy, Bilateral Salpingo Oophorectomy  . Salpingoophorectomy  09/12/2011    Procedure: SALPINGO OOPHERECTOMY;  Surgeon: Janie Morning, MD;  Location: [facility];  Service: Gynecology;  Laterality: Bilateral;  . Colonoscopy  07/05/2012    Procedure: COLONOSCOPY;  Surgeon: Rogene Houston, MD;  Location: [facility] SU[facility];  Service: Endoscopy;  Laterality: N/A;  730    No prescriptions prior to admission   Allergies  Allergen Reactions  . Carbamazepine Hives and Other (See Comments)    headache  . Sulfa Antibiotics     Rash    History  Substance Use Topics  . Smoking status: Never Smoker   . Smokeless tobacco: Never Used  . Alcohol Use: No  Family History  Problem Relation Age of Onset  . Anesthesia problems Neg Hx   . Hypotension Neg Hx   . Malignant hyperthermia Neg Hx   . Pseudochol deficiency Neg Hx   . Other Mother   . Cancer Father   . Hypertension Son      ROS  Objective:  Physical Exam  Vital signs in last 24 hours:    Labs:   Estimated body mass index is 47.78 kg/(m^2) as  calculated from the following:   Height as of 10/20/14: 5\' 4"  (1.626 m).   Weight as of 10/20/14: 126.327 kg (278 lb 8 oz).   Imaging Review Plain radiographs demonstrate moderate degenerative joint disease of the left knee(s). The overall alignment ismild varus. The bone quality appears to be good for age and reported activity level.  Assessment/Plan:  End stage arthritis, left knee   The patient history, physical examination, clinical judgment of the provider and imaging studies are consistent with end stage degenerative joint disease of the left knee(s) and total knee arthroplasty is deemed medically necessary. The treatment options including medical management, injection therapy arthroscopy and arthroplasty were discussed at length. The risks and benefits of total knee arthroplasty were presented and reviewed. The risks due to aseptic loosening, infection, stiffness, patella tracking problems, thromboembolic complications and other imponderables were discussed. The patient acknowledged the explanation, agreed to proceed with the plan and consent was signed. Patient is being admitted for inpatient treatment for surgery, pain control, PT, OT, prophylactic antibiotics, VTE prophylaxis, progressive ambulation and ADL's and discharge planning. The patient is planning to be discharged home with home health services

## 2015-03-15 NOTE — Anesthesia Procedure Notes (Signed)
Procedure Name: Intubation Date/Time: 03/15/2015 12:49 PM Performed by: Izora Gala Pre-anesthesia Checklist: Patient identified, Emergency Drugs available, Suction available and Patient being monitored Patient Re-evaluated:Patient Re-evaluated prior to inductionOxygen Delivery Method: Circle system utilized Preoxygenation: Pre-oxygenation with 100% oxygen Intubation Type: IV induction Ventilation: Mask ventilation without difficulty Laryngoscope Size: Miller and 3 Grade View: Grade II Tube type: Oral Tube size: 7.5 mm Number of attempts: 1 Airway Equipment and Method: Stylet and LTA kit utilized Placement Confirmation: ETT inserted through vocal cords under direct vision,  positive ETCO2 and breath sounds checked- equal and bilateral Secured at: 22 cm Tube secured with: Tape Dental Injury: Teeth and Oropharynx as per pre-operative assessment  Future Recommendations: Recommend- induction with short-acting agent, and alternative techniques readily available and Recommend- awake intubation

## 2015-03-15 NOTE — Anesthesia Postprocedure Evaluation (Signed)
Anesthesia Post Note  Patient: [name]  Procedure(s) Performed: Procedure(s) (LRB): COMPUTER ASSISTED TOTAL KNEE ARTHROPLASTY (Left)  Anesthesia type: general  Patient location: PACU  Post pain: Pain level controlled  Post assessment: Patient's Cardiovascular Status Stable  Last Vitals:  Filed Vitals:   03/15/15 1631  BP: 141/63  Pulse: 59  Temp:   Resp: 16    Post vital signs: Reviewed and stable  Level of consciousness: sedated  Complications: No apparent anesthesia complications

## 2015-03-15 NOTE — Brief Op Note (Signed)
03/15/2015  3:26 PM  PATIENT:  [name]  74 y.o. female  PRE-OPERATIVE DIAGNOSIS:  Left Knee Osteoarthritis   POST-OPERATIVE DIAGNOSIS:  Left Knee Osteoarthritis   PROCEDURE:  Procedure(s): COMPUTER ASSISTED TOTAL KNEE ARTHROPLASTY (Left)  SURGEON:  Surgeon(s) and Role:    Marybelle Killings, MD - Primary  PHYSICIAN ASSISTANT: Efosa Treichler m. Reonna Finlayson pa-c     ANESTHESIA:   general  EBL:  Total I/O In: 1000 [I.V.:1000] Out: 100 [Blood:100]  BLOOD ADMINISTERED:none  DRAINS: none   LOCAL MEDICATIONS USED:  MARCAINE     SPECIMEN:  No Specimen  DISPOSITION OF SPECIMEN:  N/A  COUNTS:  YES  TOURNIQUET:   Total Tourniquet Time Documented: Thigh (Left) - 73 minutes Total: Thigh (Left) - 73 minutes    PATIENT DISPOSITION:  PACU - hemodynamically stable.

## 2015-03-16 ENCOUNTER — Encounter (HOSPITAL_COMMUNITY): Payer: Self-pay | Admitting: Orthopaedic Surgery

## 2015-03-16 LAB — CBC
HEMATOCRIT: 34.3 % — AB (ref 36.0–46.0)
HEMOGLOBIN: 11.7 g/dL — AB (ref 12.0–15.0)
MCH: 37 pg — ABNORMAL HIGH (ref 26.0–34.0)
MCHC: 34.1 g/dL (ref 30.0–36.0)
MCV: 108.5 fL — AB (ref 78.0–100.0)
Platelets: 241 10*3/uL (ref 150–400)
RBC: 3.16 MIL/uL — ABNORMAL LOW (ref 3.87–5.11)
RDW: 13.4 % (ref 11.5–15.5)
WBC: 14.1 10*3/uL — AB (ref 4.0–10.5)

## 2015-03-16 LAB — BASIC METABOLIC PANEL
Anion gap: 10 (ref 5–15)
BUN: 17 mg/dL (ref 6–20)
CHLORIDE: 101 mmol/L (ref 101–111)
CO2: 26 mmol/L (ref 22–32)
Calcium: 8.7 mg/dL — ABNORMAL LOW (ref 8.9–10.3)
Creatinine, Ser: 0.74 mg/dL (ref 0.44–1.00)
GFR calc non Af Amer: >60 mL/min (ref >60)
Glucose, Bld: 122 mg/dL — ABNORMAL HIGH (ref 70–99)
Potassium: 4.2 mmol/L (ref 3.5–5.1)
SODIUM: 137 mmol/L (ref 135–145)

## 2015-03-16 NOTE — Progress Notes (Signed)
Patient still unable to spontaneously void despite dribbling. Complaining of discomfort and feeling full. Bladder scan revealed 211ml of urine. IN and out cath done per protocol. Urine concentrated, output of 373ml. Will continue to monitor for spontaneous void.

## 2015-03-16 NOTE — Progress Notes (Signed)
Patient concerned with inability to void more than a dribble. Bladder scan performed and revealed no urine. Patient does not feel distended or full at this time. Encouraging fluids at this time and fluids running via IV at 81ml/hour. Will continue to monitor.

## 2015-03-16 NOTE — Evaluation (Signed)
Occupational Therapy Evaluation Patient Details Name: [name] MRN: [id number] DOB: [birth year] Today's Date: 03/16/2015    History of Present Illness Pt is a 74 y/o F s/p L TKA.  Pt's PMH includes myelproliferative disorder, thrombocytosis, CMC arthritis, giant cell bone tumor, leukocytosis, chronic lung disease, gout, dysrhythmia, SOB, and morbid obesity.   Clinical Impression   Pt currently max assist to total +2 for LB selfcare and functional transfers at this time.  Still with increased nausea and faint feeling with transfers and standing.  Will continue to follow for acute care needs but feel pt will likely need SNF for short term rehab based on needing extensive assist at this time.      Follow Up Recommendations  SNF          Precautions / Restrictions Precautions Precautions: Fall;Knee Precaution Booklet Issued: Yes (comment) Precaution Comments: Reviewed no pillow under knee Required Braces or Orthoses: Knee Immobilizer - Left Knee Immobilizer - Left: On except when in CPM Restrictions Weight Bearing Restrictions: No LLE Weight Bearing: Weight bearing as tolerated      Mobility Bed Mobility                  Transfers Overall transfer level: Needs assistance Equipment used: Rolling walker (2 wheeled) Transfers: Sit to/from [facility] Sit to Stand: Mod assist;+2 physical assistance Stand pivot transfers: Max assist;+2 physical assistance       General transfer comment: Pt with decreased ability to support her weight in the UEs and the LLE for advancement of the RLE.  KI used during transfer and standing to decreased LLE from buckling.     Balance Overall balance assessment: Needs assistance Sitting-balance support: Bilateral upper extremity supported Sitting balance-Leahy Scale: Fair     Standing balance support: Bilateral upper extremity supported Standing balance-Leahy Scale: Poor                               ADL Overall ADL's : Needs assistance/impaired Eating/Feeding: Independent   Grooming: Wash/dry hands;Wash/dry face;Supervision/safety;Sitting   Upper Body Bathing: Supervision/ safety;Sitting   Lower Body Bathing: Sit to/from stand;Moderate assistance;+2 for physical assistance   Upper Body Dressing : Supervision/safety;Sitting   Lower Body Dressing: Maximal assistance;+2 for physical assistance;Sit to/from stand   Toilet Transfer: BSC;Stand-pivot;RW;Maximal assistance   Toileting- Clothing Manipulation and Hygiene: Maximal assistance;Sit to/from stand       Functional mobility during ADLs: +2 for physical assistance;Moderate assistance General ADL Comments: Pt with increased nausea during session with nursing administering medication through IV to assist with this.  Pt with history of decreased flexibility for LB dressing and bathing.  Will likely benefit from AE to assist with this.  Educated pt and family on DME available for tub shower if walk-in bathtub is not accessible.      Vision Vision Assessment?: No apparent visual deficits   Perception Perception Perception Tested?: No   Praxis Praxis Praxis tested?: Not tested    Pertinent Vitals/Pain Pain Score: 7  Pain Location: left knee Pain Intervention(s): Limited activity within patient's tolerance;Monitored during session;Repositioned     Hand Dominance Right   Extremity/Trunk Assessment Upper Extremity Assessment Upper Extremity Assessment: Overall WFL for tasks assessed   Lower Extremity Assessment Lower Extremity Assessment: Defer to PT evaluation   Cervical / Trunk Assessment Cervical / Trunk Assessment: Normal   Communication Communication Communication: No difficulties   Cognition Arousal/Alertness: Awake/alert Behavior During Therapy: WFL for tasks assessed/performed  Overall Cognitive Status: Within Functional Limits for tasks assessed                                Home Living  Family/patient expects to be discharged to:: Private residence Living Arrangements: Spouse/significant other (husband) Available Help at Discharge: Family;Available PRN/intermittently (Husband home 24/7, children live next door ) Type of Home: House Home Access: Stairs to enter [employer] of Steps: 1 Entrance Stairs-Rails: None Home Layout: One level               Home Equipment: Walker - 4 wheels;Walker - 2 wheels;Cane - single point;Bedside commode;Electric scooter (rollator; has a walk in tub)   Additional Comments: Husband will be available for supervision and min assist (elderly and able to provide limited physical assist).  Pt's son and daughter live next door and will be free to provide intermittent assist before and after work.      Prior Functioning/Environment Level of Independence: Independent with assistive device(s)        Comments: inside house used quad cane, outside house used rollator    OT Diagnosis: Generalized weakness;Acute pain   OT Problem List: Decreased strength;Decreased activity tolerance;Impaired balance (sitting and/or standing);Pain;Cardiopulmonary status limiting activity;Decreased knowledge of use of DME or AE;Obesity   OT Treatment/Interventions: Self-care/ADL training;Balance training;Therapeutic activities;DME and/or AE instruction;Patient/family education    OT Goals(Current goals can be found in the care plan section) Acute Rehab OT Goals Patient Stated Goal: Wants to get the nausea and pain to go away. OT Goal Formulation: With patient/family Time For Goal Achievement: 03/23/15 Potential to Achieve Goals: Good  OT Frequency: Min 2X/week              End of Session Equipment Utilized During Treatment: Rolling walker;Oxygen;Left knee immobilizer CPM Left Knee CPM Left Knee: Off Nurse Communication: Mobility status;Other (comment) (requests for nausea meds)  Activity Tolerance: Patient limited by pain;Patient limited by  fatigue Patient left: in bed (sitting EOB with PT present to take over session)   Time: 1440-1509 OT Time Calculation (min): 29 min Charges:  OT General Charges $OT Visit: 1 Procedure OT Evaluation $Initial OT Evaluation Tier I: 1 Procedure OT Treatments $Self Care/Home Management : 8-22 mins  Daivion Pape OTR/L 03/16/2015, 3:28 PM

## 2015-03-16 NOTE — Progress Notes (Signed)
Subjective: 1 Day Post-Op Procedure(s) (LRB): COMPUTER ASSISTED TOTAL KNEE ARTHROPLASTY (Left) Patient reports pain as moderate.    Objective: Vital signs in last 24 hours: Temp:  [97 F (36.1 C)-97.9 F (36.6 C)] 97.5 F (36.4 C) (05/10 0520) Pulse Rate:  [57-79] 79 (05/10 0520) Resp:  [15-23] 15 (05/09 1705) BP: (130-166)/(50-64) 130/55 mmHg (05/10 0520) SpO2:  [93 %-99 %] 93 % (05/10 0520) Weight:  [123.801 kg (272 lb 14.9 oz)] 123.801 kg (272 lb 14.9 oz) (05/09 0923)  Intake/Output from previous day: 05/09 0701 - 05/10 0700 In: 1150 [I.V.:1150] Out: 200 [Urine:100; Blood:100] Intake/Output this shift:     Recent Labs  03/16/15 0530  HGB 11.7*    Recent Labs  03/16/15 0530  WBC 14.1*  RBC 3.16*  HCT 34.3*  PLT 241   No results for input(s): NA, K, CL, CO2, BUN, CREATININE, GLUCOSE, CALCIUM in the last 72 hours. No results for input(s): LABPT, INR in the last 72 hours.  Neurologically intact   Some bloody drainage on dressing. Will have changed today.  Assessment/Plan: 1 Day Post-Op Procedure(s) (LRB): COMPUTER ASSISTED TOTAL KNEE ARTHROPLASTY (Left) Up with therapy  Need FL-2 to be placed on chart.   Jalik Gellatly C 03/16/2015, 7:50 AM

## 2015-03-16 NOTE — Care Management Note (Signed)
Case Management Note  Patient Details  Name: [name] MRN: [id number] Date of Birth: 11/15/1940  Subjective/Objective:                    Action/Plan:   Expected Discharge Date:                  Expected Discharge Plan:     In-House Referral:     Discharge planning Services  CM Consult  Post Acute Care Choice:  Home Health Choice offered to:  Patient, Adult Children  DME Arranged:    DME Agency:  [place]:  PT [facility] Agency:  [place]  Status of Service:  In process, will continue to follow  Medicare Important Message Given:    Date Medicare IM Given:    Medicare IM give by:    Date Additional Medicare IM Given:    Additional Medicare Important Message give by:     If discussed at [place] of Stay Meetings, dates discussed:    Additional Comments:  Ninfa Meeker, RN 03/16/2015, 11:10 AM

## 2015-03-16 NOTE — Evaluation (Signed)
Physical Therapy Evaluation Patient Details Name: [name] MRN: [id number] DOB: [birth year] Today's Date: 03/16/2015   History of Present Illness  Pt is a 74 y/o F s/p L TKA.  Pt's PMH includes myelproliferative disorder, thrombocytosis, CMC arthritis, giant cell bone tumor, leukocytosis, chronic lung disease, gout, dysrhythmia, SOB, and morbid obesity.  Clinical Impression  Pt is s/p L TKA resulting in the deficits listed below (see PT Problem List).  Pt limited by nausea and dizziness this session.  Pt has assist from elderly husband at home 24/7 and intermittently from son and daughter who live next door.  Pt's progress next session will determine if pt will benefit from d/c to home w/ HHPT or SNF, this was discussed w/ the pt and pt's family and all verbalized understanding.  Pt only has one step to enter home and this should be practiced at next session if appropriate.  Pt will benefit from skilled PT to increase their independence and safety with mobility to allow discharge to the venue listed below.      Follow Up Recommendations Home health PT;Supervision/Assistance - 24 hour;SNF    Equipment Recommendations  None recommended by PT    Recommendations for Other Services OT consult     Precautions / Restrictions Precautions Precautions: Fall;Knee Precaution Booklet Issued: Yes (comment) Precaution Comments: Reviewed no pillow under knee Required Braces or Orthoses: Knee Immobilizer - Left Knee Immobilizer - Left: On except when in CPM Restrictions Weight Bearing Restrictions: Yes LLE Weight Bearing: Weight bearing as tolerated      Mobility  Bed Mobility Overal bed mobility: Needs Assistance Bed Mobility: Supine to Sit     Supine to sit: Mod assist;HOB elevated;+2 for physical assistance     General bed mobility comments: Son assist pt's trunk posteriorly, PT assist w/ managing BLEs to sitting EOB.  Mod use of bed rails.  Cues for proper technique.     Transfers Overall transfer level: Needs assistance Equipment used: Rolling walker (2 wheeled) Transfers: Sit to/from Stand Sit to Stand: Mod assist;+2 physical assistance;From elevated surface         General transfer comment: Cues for hand placement and to push through RLE and BUEs to stand upright.  Pt required mod assist from PT and pt's son to power up to standing w/ bed elevated and use of bed rail for assist.    Ambulation/Gait Ambulation/Gait assistance: Mod assist Ambulation Distance (Feet): 5 Feet Assistive device: Rolling walker (2 wheeled) Gait Pattern/deviations: Step-to pattern;Decreased stride length;Decreased stance time - left;Steppage;Decreased weight shift to left;Antalgic;Trunk flexed   Gait velocity interpretation: Below normal speed for age/gender General Gait Details: Cues for proper sequencing of RW to recliner chair.  Trunk flexed, dec weight shift to LLE.  Pt c/o dizziness which limited ambulatory distance this session, dizziness dissipated once sitting in recliner chair.  Stairs            Wheelchair Mobility    Modified Rankin (Stroke Patients Only)       Balance Overall balance assessment: Needs assistance Sitting-balance support: Bilateral upper extremity supported;Feet supported Sitting balance-Leahy Scale: Fair Sitting balance - Comments: Pt able to sit EOB for 5 mins prior to sit>stand w/o support; however required support of trunk posteriorly during supine>sit Postural control: Posterior lean Standing balance support: Bilateral upper extremity supported;During functional activity Standing balance-Leahy Scale: Poor  Pertinent Vitals/Pain Pain Assessment: 0-10 Pain Score: 4  Pain Location: L knee Pain Descriptors / Indicators: Aching;Cramping;Moaning;Guarding;Grimacing;Discomfort Pain Intervention(s): Limited activity within patient's tolerance;Monitored during session;Repositioned    Home  Living Family/patient expects to be discharged to:: Private residence Living Arrangements: Spouse/significant other (husband) Available Help at Discharge: Family;Available PRN/intermittently (Husband home 24/7, children live next door ) Type of Home: House Home Access: Stairs to enter Entrance Stairs-Rails: None Entrance Stairs-Number of Steps: 1 Home Layout: One level Home Equipment: [place] - 4 wheels;Walker - 2 wheels;Cane - single point;Bedside commode;Electric scooter (rollator; has a walk in tub) Additional Comments: Husband will be available for supervision and min assist (elderly and able to provide limited physical assist).  Pt's son and daughter live next door and will be free to provide intermittent assist before and after work.    Prior Function Level of Independence: Independent with assistive device(s)         Comments: inside house used quad cane, outside house used rollator     Hand Dominance   Dominant Hand: Right    Extremity/Trunk Assessment   Upper Extremity Assessment: Defer to OT evaluation           Lower Extremity Assessment: LLE deficits/detail   LLE Deficits / Details: weakness and limited ROM as expected s/p L TKA     Communication   Communication: No difficulties  Cognition Arousal/Alertness: Awake/alert Behavior During Therapy: WFL for tasks assessed/performed Overall Cognitive Status: Within Functional Limits for tasks assessed                      General Comments General comments (skin integrity, edema, etc.): Spoke w/ pt and pt's family about possibility of home w/ HHPT or SNF depending on pt's progress. SpO2 on 1 LPM [place]: 91% supine in bed, 85% sitting EOB, 90 sitting in recliner chair after ambulation    Exercises Total Joint Exercises Ankle Circles/Pumps: AROM;Both;Supine;20 reps Quad Sets: AROM;Both;10 reps;Supine      Assessment/Plan    PT Assessment Patient needs continued PT services  PT Diagnosis Difficulty  walking;Abnormality of gait;Generalized weakness;Acute pain   PT Problem List Decreased strength;Decreased range of motion;Decreased activity tolerance;Decreased balance;Decreased mobility;Decreased coordination;Decreased knowledge of use of DME;Decreased safety awareness;Decreased knowledge of precautions;Cardiopulmonary status limiting activity;Obesity;Decreased skin integrity;Pain  PT Treatment Interventions DME instruction;Gait training;Stair training;Functional mobility training;Therapeutic activities;Therapeutic exercise;Balance training;Neuromuscular re-education;Cognitive remediation;Modalities;Patient/family education   PT Goals (Current goals can be found in the Care Plan section) Acute Rehab PT Goals Patient Stated Goal: to go home tomorrow PT Goal Formulation: With patient/family Time For Goal Achievement: 03/23/15 Potential to Achieve Goals: Good    Frequency 7X/week   Barriers to discharge Inaccessible home environment;Decreased caregiver support 1 step to enter home; limited physical assist at home    Co-evaluation               End of Session Equipment Utilized During Treatment: Gait belt;Oxygen;Left knee immobilizer Activity Tolerance: Patient limited by fatigue;Treatment limited secondary to medical complications (Comment) (limited by dizziness) Patient left: in chair;with call bell/phone within reach;with family/visitor present Nurse Communication: Mobility status;Precautions;Weight bearing status (Pt's SpO2 levels)         Time: 3335-4562 PT Time Calculation (min) (ACUTE ONLY): 31 min   Charges:   PT Evaluation $Initial PT Evaluation Tier I: 1 Procedure PT Treatments $Therapeutic Exercise: 8-22 mins   PT G Codes:       Joslyn Hy PT, DPT [phone] Pager: [phone] 03/16/2015, 10:12 AM

## 2015-03-16 NOTE — Op Note (Signed)
NAMEFLORIA, BRANDAU NO.:  [id number]  MEDICAL RECORD NO.:  [id number]  LOCATION:  5N01C                        FACILITY:  [facility]  PHYSICIAN:  Hillery Bhalla C. Lorin Mercy, M.D.    DATE OF BIRTH:  10-22-1941  DATE OF PROCEDURE:  03/15/2015 DATE OF DISCHARGE:                              OPERATIVE REPORT   PREOPERATIVE DIAGNOSIS:  Left knee osteoarthritis.  POSTOPERATIVE DIAGNOSIS:  Left knee osteoarthritis.  PROCEDURE:  Left cemented total knee arthroplasty, computer assist.  SURGEON:  Jash Wahlen C. Lorin Mercy, M.D.  ASSISTANT:  Alyson Locket. Ricard Dillon, PA-C, medically necessary and present for the entire procedure.  ANESTHESIA:  General plus preoperative block and Marcaine local.  COMPONENTS:  #3 [employer] Johnson LCS femur with lugs.  #3 tibia rotating platform, 10-mm poly tray rotating platform.  Three pegged cemented patella.  DESCRIPTION OF PROCEDURE:  After induction of general anesthesia, lateral post, heel bump, proximal thigh tourniquet, prepping and draping was performed with DuraPrep, usual extremity sheets drapes were applied, impervious stockinette, Coban, and Betadine Steri-Drape.  An extra piece was covered the skin due to the patient's size at 123.8 kg.  Leg was wrapped with an Esmarch, tourniquet inflated after time-out.  Midline incision was made.  Patella was not able to be everted due to the thick adipose layer and short leg.  The patella was flipped about 90 degrees, cut from facet-to-facet, removing 10 mm.  Site that a 38 and three pegs were drilled.  Computer pins were placed, stab incision in midtibia, placed inside the incision in the distal femur and then computer models were generated.  Knee was then 4 degrees varus and had 70-degree knee flexion contracture.  9 was taken off the femur and 9 on the tibia. Chamfer cuts were made.  The rotation cuts, verification.  Trials were inserted.  A 10 mm gave balance with less than 1 mm difference, was slightly tighter  medial side than lateral.  Posterior spurs had been removed with three cortical curved osteotome, and meniscus and cruciates had been resected.  Pulse lavage was used.  Tibia was cemented first followed by femur, placement of 10-mm poly and then standard closure after hemostasis with the cement 115 minutes, tourniquet deflated, hemostasis obtained and then standard layered closure, 2-0 Vicryl in the subcutaneous tissue and then skin closure.  Instrument count and needle count were correct.    Chad Donoghue C. Lorin Mercy, M.D.    MCY/MEDQ  D:  03/15/2015  T:  03/16/2015  Job:  [id number]

## 2015-03-16 NOTE — Progress Notes (Signed)
Physical Therapy Treatment Patient Details Name: [name] MRN: [id number] DOB: 1941-03-21 Today's Date: 03/16/2015    History of Present Illness Pt is a 74 y/o F s/p L TKA.  Pt's PMH includes myelproliferative disorder, thrombocytosis, CMC arthritis, giant cell bone tumor, leukocytosis, chronic lung disease, gout, dysrhythmia, SOB, and morbid obesity.    PT Comments    Pt's progress limited by nausea, pain, and quickness to fatigue.  Pt lethargic this session w/ difficulty keeping eyes open.  Max verbal cues for breathing in through her nose to maintain appropriate SpO2 levels.  Discussed at length w/ pt and pt's family about SNF upon d/c based on pt's slow progress w/ mobility and pt and pt's family agreed that this would be the safest option for the pt as the pt's husband is not able to provide full physical assist at home and is the only one at home during the day.  Pt will benefit from continued skilled PT services to increase functional independence and safety.   Follow Up Recommendations  SNF;Supervision/Assistance - 24 hour     Equipment Recommendations  None recommended by PT    Recommendations for Other Services       Precautions / Restrictions Precautions Precautions: Fall;Knee Precaution Booklet Issued: Yes (comment) Precaution Comments: Reviewed no pillow under knee Required Braces or Orthoses: Knee Immobilizer - Left Knee Immobilizer - Left: On except when in CPM Restrictions Weight Bearing Restrictions: Yes LLE Weight Bearing: Weight bearing as tolerated    Mobility  Bed Mobility Overal bed mobility: Needs Assistance Bed Mobility: Sit to Supine       Sit to supine: Mod assist   General bed mobility comments: Mod assist w/ managing BLEs back into bed.  Max verbal and tactile cues for technique to scoot toward HOB while in trendelenburg position.  Transfers Overall transfer level: Needs assistance Equipment used: Rolling walker (2  wheeled) Transfers: Sit to/from Stand Sit to Stand: Mod assist;From elevated surface Stand pivot transfers: Max assist;+2 physical assistance       General transfer comment: Verbal cues to push from bed, pt wants to pull on RW.  Multiple attempts for sit>stand w/ verbal cues to lean forward and push through RLE and BUEs.    Ambulation/Gait Ambulation/Gait assistance: Mod assist Ambulation Distance (Feet): 7 Feet Assistive device: Rolling walker (2 wheeled) Gait Pattern/deviations: Step-to pattern;Antalgic;Decreased weight shift to left;Decreased stance time - left;Trunk flexed   Gait velocity interpretation: Below normal speed for age/gender General Gait Details: Cues to stand upright, trunk flexed.  Pt c/o generalized weakness and requests to return to bed after ambulating 3 ft.  Pt's son w/ close chair follow.  Cues for proper hand placement on RW.     Stairs            Wheelchair Mobility    Modified Rankin (Stroke Patients Only)       Balance Overall balance assessment: Needs assistance Sitting-balance support: Bilateral upper extremity supported;Feet supported Sitting balance-Leahy Scale: Fair     Standing balance support: Bilateral upper extremity supported;During functional activity Standing balance-Leahy Scale: Poor                      Cognition Arousal/Alertness: Lethargic Behavior During Therapy: Flat affect Overall Cognitive Status: Within Functional Limits for tasks assessed                      Exercises      General Comments General comments (skin integrity,  edema, etc.): Max verbal cues to maintain breathing in through nose w/ SpO2 fluctuating between 85-99% during session on 2.5-3LPM.      Pertinent Vitals/Pain Pain Assessment: 0-10 Pain Score: 7  Pain Location: L knee Pain Descriptors / Indicators: Aching Pain Intervention(s): Limited activity within patient's tolerance;Monitored during session;Repositioned;Utilized  relaxation techniques    Home Living Family/patient expects to be discharged to:: Private residence Living Arrangements: Spouse/significant other (husband) Available Help at Discharge: Family;Available PRN/intermittently (Husband home 24/7, children live next door ) Type of Home: House Home Access: Stairs to enter Entrance Stairs-Rails: None Home Layout: One level Home Equipment: [employer] - 4 wheels;Walker - 2 wheels;Cane - single point;Bedside commode;Electric scooter (rollator; has a walk in tub) Additional Comments: Husband will be available for supervision and min assist (elderly and able to provide limited physical assist).  Pt's son and daughter live next door and will be free to provide intermittent assist before and after work.    Prior Function Level of Independence: Independent with assistive device(s)      Comments: inside house used quad cane, outside house used rollator   PT Goals (current goals can now be found in the care plan section) Acute Rehab PT Goals Patient Stated Goal: To rest in bed after therapy Progress towards PT goals: Not progressing toward goals - comment (Pt limited by nausea, fatigue, and pain in L knee.)    Frequency  7X/week    PT Plan Current plan remains appropriate    Co-evaluation             End of Session Equipment Utilized During Treatment: Gait belt;Oxygen;Left knee immobilizer Activity Tolerance: Patient limited by fatigue;Patient limited by pain;Patient limited by lethargy Patient left: in bed;with call bell/phone within reach;with family/visitor present     Time: 1749-4496 PT Time Calculation (min) (ACUTE ONLY): 28 min  Charges:  $Gait Training: 8-22 mins $Therapeutic Activity: 8-22 mins                    G Codes:      Joslyn Hy PT, [place] [phone] Pager: [phone] 03/16/2015, 3:42 PM

## 2015-03-16 NOTE — Progress Notes (Signed)
Orthopedic Tech Progress Note Patient Details:  [name] 11-24-1940 [id number] On cpm at 7:00 pm 0-50 Patient ID: [name], female   DOB: 11/06/1941, 74 y.o.   MRN: [id number]   Braulio Bosch 03/16/2015, 7:01 PM

## 2015-03-17 LAB — CBC
HCT: 31.4 % — ABNORMAL LOW (ref 36.0–46.0)
Hemoglobin: 10.2 g/dL — ABNORMAL LOW (ref 12.0–15.0)
MCH: 36.3 pg — ABNORMAL HIGH (ref 26.0–34.0)
MCHC: 32.5 g/dL (ref 30.0–36.0)
MCV: 111.7 fL — AB (ref 78.0–100.0)
Platelets: 214 10*3/uL (ref 150–400)
RBC: 2.81 MIL/uL — AB (ref 3.87–5.11)
RDW: 13.8 % (ref 11.5–15.5)
WBC: 11.6 10*3/uL — AB (ref 4.0–10.5)

## 2015-03-17 NOTE — Clinical Social Work Placement (Addendum)
   CLINICAL SOCIAL WORK PLACEMENT  NOTE  Date:  03/17/2015  Patient Details  Name: [name] MRN: [id number] Date of Birth: [birth year]  Clinical Social Work is seeking post-discharge placement for this patient at the [place] level of care (*CSW will initial, date and re-position this form in  chart as items are completed):  Yes   Patient/family provided with [place] Work Department's list of facilities offering this level of care within the geographic area requested by the patient (or if unable, by the patient's family).  Yes   Patient/family informed of their freedom to choose among providers that offer the needed level of care, that participate in Medicare, Medicaid or managed care program needed by the patient, have an available bed and are willing to accept the patient.  Yes   Patient/family informed of [facility]'s ownership interest in [facility] and [facility], as well as of the fact that they are under no obligation to receive care at these facilities.  PASRR submitted to EDS on 03/17/15     PASRR number received on 03/17/15     Existing PASRR number confirmed on       FL2 transmitted to all facilities in geographic area requested by pt/family on 03/17/15     FL2 transmitted to all facilities within larger geographic area on       Patient informed that his/her managed care company has contracts with or will negotiate with certain facilities, including the following:   (list provided)         Patient/family informed of bed offers received.  Patient chooses bed at     [facility]  Physician recommends and patient chooses bed at     n/a Patient to be transferred to  [facility] on  03/18/2015  Patient to be transferred to facility by    [facility]   Patient family notified on   03/18/2015 of transfer.  Name of family member notified:      [name], husband  PHYSICIAN Please sign FL2, Please prepare prescriptions      Additional Comment:    _______________________________________________ Nonnie Done, LCSW [phone]  Psychiatric & Orthopedics (5N 1-8) Clinical Social Worker    03/17/2015, 12:20 PM

## 2015-03-17 NOTE — Clinical Documentation Improvement (Signed)
MD's, NP's, and PA's   Admitted with diagnosis of OA if possible please provide type if known.  Thank you   Possible Conditions  Primary Osteoarthritis  Secondary Osteoarthritis  Posttraumatic  Erosive  Localized  Generalized  Other  Not able to determine  Risk Factors: OA Treatment: joint replacement surgery   Thank Sharlette Dense Anani Gu  Clinical Documentation Specialist: [place]

## 2015-03-17 NOTE — Progress Notes (Addendum)
Subjective: 2 Days Post-Op Procedure(s) (LRB): COMPUTER ASSISTED TOTAL KNEE ARTHROPLASTY (Left) Patient reports pain as mild.    Objective: Vital signs in last 24 hours: Temp:  [98.6 F (37 C)] 98.6 F (37 C) (05/11 0441) Pulse Rate:  [68-81] 68 (05/11 0441) Resp:  [16] 16 (05/11 0441) BP: (122-142)/(47-62) 122/47 mmHg (05/11 0441) SpO2:  [92 %-97 %] 97 % (05/11 0441)  Intake/Output from previous day: 05/10 0701 - 05/11 0700 In: 240 [P.O.:240] Out: 625 [Urine:625] Intake/Output this shift:     Recent Labs  03/16/15 0530 03/17/15 0549  HGB 11.7* 10.2*    Recent Labs  03/16/15 0530 03/17/15 0549  WBC 14.1* 11.6*  RBC 3.16* 2.81*  HCT 34.3* 31.4*  PLT 241 214    Recent Labs  03/16/15 0530  NA 137  K 4.2  CL 101  CO2 26  BUN 17  CREATININE 0.74  GLUCOSE 122*  CALCIUM 8.7*   No results for input(s): LABPT, INR in the last 72 hours.  Neurologically intact  Assessment/Plan: 2 Days Post-Op Procedure(s) (LRB): COMPUTER ASSISTED TOTAL KNEE ARTHROPLASTY (Left) Up with therapy  SNF  Dorri Ozturk C 03/17/2015, 7:38 AM Still waiting on FL-2 .   No care management/social workers here at 7:30 AM .  Will ask them to fax it to my office.

## 2015-03-17 NOTE — Clinical Social Work Note (Signed)
Clinical Social Work Assessment  Patient Details  Name: [name] MRN: [id number] Date of Birth: 1941/03/08  Date of referral:  03/17/15               Reason for consult:  Facility Placement                Permission sought to share information with:  Chartered certified accountant granted to share information::  Yes, Verbal Permission Granted  Name::        Agency::   ([facility] [employer])  Relationship::     Contact Information:     Housing/Transportation Living arrangements for the past 2 months:  Single Family Home Source of Information:  Facility, Spouse (patient deferred to husband) Patient Interpreter Needed:  None Criminal Activity/Legal Involvement Pertinent to Current Situation/Hospitalization:  No - Comment as needed Significant Relationships:  Spouse Lives with:    Do you feel safe going back to the place where you live?  No (fall risk) Need for family participation in patient care:     Care giving concerns:  Patient's husband concerned that he may not be able to meet the patient's needs if discharged home.  Patient husband feels more comfortable with patient being discharged to SNF.  Prefers [employer].   Social Worker assessment / plan:  CSW met with patient and husband.  Husband participated in this assessment as patient was sleepy after pain medication was administered.  Patient husband states that patient had previously thought SNF was the plan and was prepared for SNF after surgery.  Patient and husband prefer [facility] as they live in [place].  CSW received verbal permission to search [facility] for SNF placement.    Employment status:  Retired Nurse, adult PT Recommendations:  [address] [place], [place] / Referral to community resources:  [place]  Patient/Family's Response to care:  Patient and husband appreciate CSW assistance  in SNF search and disposition planning  Patient/Family's Understanding of and Emotional Response to Diagnosis, Current Treatment, and Prognosis:  Patient and husband are both aware of prognosis and projected STR.  Both are accepting and agreeable to such placement and rehab.  Husband expressed guilt due to his inability to care for wife at time of discharge as he feels that he his "on up in age" and frail himself.  CSW offered support and normalization to this situation.    Emotional Assessment Appearance:  Appears stated age Attitude/Demeanor/Rapport:   (appropriate) Affect (typically observed):  Accepting, Pleasant, Quiet, Stable, Calm, Appropriate, Hopeful, Adaptable Orientation:  Oriented to Self, Oriented to Place, Oriented to  Time, Oriented to Situation Alcohol / Substance use:  Not Applicable Psych involvement (Current and /or in the community):  No (Comment)  Discharge Needs  Concerns to be addressed:  No discharge needs identified Readmission within the last 30 days:    Current discharge risk:  None Barriers to Discharge:  No Barriers Identified   Nonnie Done, LCSW [phone]  Psychiatric & Orthopedics (5N 1-8) Clinical Social Worker     03/17/2015, 12:14 PM

## 2015-03-17 NOTE — Progress Notes (Signed)
Physical Therapy Treatment Patient Details Name: [name] MRN: [id number] DOB: 1941-03-02 Today's Date: 03/17/2015    History of Present Illness Pt is a 74 y/o F s/p L TKA.  Pt's PMH includes myelproliferative disorder, thrombocytosis, CMC arthritis, giant cell bone tumor, leukocytosis, chronic lung disease, gout, dysrhythmia, SOB, and morbid obesity.    PT Comments    Pt required max verbal cues and demonstration for proper technique for sit>stand. Pt w/ tendency to push posteriorly during sit>stand and education provided to pt on leaning anteriorly for assist.  Pt ambulated 10 ft before reporting L knee buckle and chair was stabilized behind pt for her to sit.  Poor controlled descent to chair.  Pt will benefit from continued skilled PT services to increase functional independence and safety.   Follow Up Recommendations  SNF;Supervision/Assistance - 24 hour     Equipment Recommendations  None recommended by PT    Recommendations for Other Services       Precautions / Restrictions Precautions Precautions: Fall;Knee Precaution Comments: Reviewed no pillow under knee Required Braces or Orthoses: Knee Immobilizer - Left Knee Immobilizer - Left: On except when in CPM Restrictions Weight Bearing Restrictions: Yes LLE Weight Bearing: Weight bearing as tolerated    Mobility  Bed Mobility               General bed mobility comments: In recliner  Transfers Overall transfer level: Needs assistance Equipment used: Rolling walker (2 wheeled) Transfers: Sit to/from Stand Sit to Stand: +2 physical assistance;Max assist         General transfer comment: Max verbal cues and demonstration to lean forward to assist w/ sit>stand, pt pushing posteriorly.  Cues for hand placement and education on stability of recliner chair vs. RW.    Ambulation/Gait Ambulation/Gait assistance: Mod assist;+2 safety/equipment Ambulation Distance (Feet): 10 Feet Assistive device: Rolling  walker (2 wheeled) Gait Pattern/deviations: Step-to pattern;Decreased stride length;Decreased stance time - left;Decreased weight shift to left;Antalgic;Trunk flexed   Gait velocity interpretation: Below normal speed for age/gender General Gait Details: Cues to stand upright, pt c/o fatigue and R knee buckling after ambulating 10 ft   Stairs            Wheelchair Mobility    Modified Rankin (Stroke Patients Only)       Balance Overall balance assessment: Needs assistance Sitting-balance support: Bilateral upper extremity supported;Feet supported Sitting balance-Leahy Scale: Fair     Standing balance support: Bilateral upper extremity supported;During functional activity Standing balance-Leahy Scale: Poor                      Cognition Arousal/Alertness: Lethargic Behavior During Therapy: Flat affect Overall Cognitive Status: Within Functional Limits for tasks assessed                      Exercises      General Comments        Pertinent Vitals/Pain Pain Assessment: 0-10 Pain Score: 6  Pain Location: L knee Pain Descriptors / Indicators: Aching;Heaviness Pain Intervention(s): Limited activity within patient's tolerance;Monitored during session;Repositioned    Home Living                      Prior Function            PT Goals (current goals can now be found in the care plan section) Acute Rehab PT Goals Patient Stated Goal: to go to rehab Progress towards PT goals: Progressing toward goals  Frequency  7X/week    PT Plan Current plan remains appropriate    Co-evaluation             End of Session Equipment Utilized During Treatment: Gait belt;Oxygen;Left knee immobilizer Activity Tolerance: Patient limited by fatigue Patient left: in chair;with call bell/phone within reach;with family/visitor present     Time: 2182-8833 PT Time Calculation (min) (ACUTE ONLY): 16 min  Charges:  $Gait Training: 8-22 mins                     G Codes:      Joslyn Hy PT, [place] [phone] Pager: [phone] 03/17/2015, 2:40 PM

## 2015-03-17 NOTE — Clinical Social Work Note (Signed)
CSW faxed FL2 to MD office per MD request.  Placed original (unsigned) FL2 on the chart as well.  Nonnie Done, LCSW [phone]  Psychiatric & Orthopedics (5N 1-8) Clinical Social Worker

## 2015-03-18 ENCOUNTER — Inpatient Hospital Stay
Admission: RE | Admit: 2015-03-18 | Discharge: 2015-03-30 | Disposition: A | Discharge status: Home / Self Care | Payer: Medicare Other | Source: Ambulatory Visit | Attending: Internal Medicine | Admitting: Internal Medicine

## 2015-03-18 DIAGNOSIS — Z471 Aftercare following joint replacement surgery: Secondary | ICD-10-CM | POA: Diagnosis not present

## 2015-03-18 DIAGNOSIS — M1990 Unspecified osteoarthritis, unspecified site: Secondary | ICD-10-CM | POA: Diagnosis not present

## 2015-03-18 DIAGNOSIS — J42 Unspecified chronic bronchitis: Secondary | ICD-10-CM | POA: Diagnosis not present

## 2015-03-18 DIAGNOSIS — M6281 Muscle weakness (generalized): Secondary | ICD-10-CM | POA: Diagnosis not present

## 2015-03-18 DIAGNOSIS — D471 Chronic myeloproliferative disease: Secondary | ICD-10-CM | POA: Diagnosis not present

## 2015-03-18 DIAGNOSIS — M1000 Idiopathic gout, unspecified site: Secondary | ICD-10-CM | POA: Diagnosis not present

## 2015-03-18 DIAGNOSIS — I10 Essential (primary) hypertension: Secondary | ICD-10-CM | POA: Diagnosis not present

## 2015-03-18 DIAGNOSIS — Z96652 Presence of left artificial knee joint: Secondary | ICD-10-CM | POA: Diagnosis not present

## 2015-03-18 DIAGNOSIS — Z96659 Presence of unspecified artificial knee joint: Secondary | ICD-10-CM | POA: Diagnosis not present

## 2015-03-18 DIAGNOSIS — K449 Diaphragmatic hernia without obstruction or gangrene: Secondary | ICD-10-CM | POA: Diagnosis not present

## 2015-03-18 DIAGNOSIS — D473 Essential (hemorrhagic) thrombocythemia: Secondary | ICD-10-CM | POA: Diagnosis not present

## 2015-03-18 DIAGNOSIS — M25562 Pain in left knee: Secondary | ICD-10-CM | POA: Diagnosis not present

## 2015-03-18 DIAGNOSIS — R262 Difficulty in walking, not elsewhere classified: Secondary | ICD-10-CM | POA: Diagnosis not present

## 2015-03-18 DIAGNOSIS — Z9981 Dependence on supplemental oxygen: Secondary | ICD-10-CM | POA: Diagnosis not present

## 2015-03-18 DIAGNOSIS — M189 Osteoarthritis of first carpometacarpal joint, unspecified: Secondary | ICD-10-CM | POA: Diagnosis not present

## 2015-03-18 DIAGNOSIS — R278 Other lack of coordination: Secondary | ICD-10-CM | POA: Diagnosis not present

## 2015-03-18 DIAGNOSIS — K219 Gastro-esophageal reflux disease without esophagitis: Secondary | ICD-10-CM | POA: Diagnosis not present

## 2015-03-18 LAB — CBC
HEMATOCRIT: 27.2 % — AB (ref 36.0–46.0)
Hemoglobin: 8.9 g/dL — ABNORMAL LOW (ref 12.0–15.0)
MCH: 36.6 pg — ABNORMAL HIGH (ref 26.0–34.0)
MCHC: 32.7 g/dL (ref 30.0–36.0)
MCV: 111.9 fL — AB (ref 78.0–100.0)
Platelets: 201 10*3/uL (ref 150–400)
RBC: 2.43 MIL/uL — ABNORMAL LOW (ref 3.87–5.11)
RDW: 13.7 % (ref 11.5–15.5)
WBC: 8.9 10*3/uL (ref 4.0–10.5)

## 2015-03-18 MED ORDER — HYDROCODONE-ACETAMINOPHEN 10-325 MG PO TABS: 1.0000 | ORAL_TABLET | Freq: Four times a day (QID) | ORAL | Status: DC | PRN

## 2015-03-18 MED ORDER — ASPIRIN 325 MG PO TBEC: 325.0000 mg | DELAYED_RELEASE_TABLET | Freq: Every day | ORAL | Status: DC

## 2015-03-18 MED ORDER — METHOCARBAMOL 500 MG PO TABS: 500.0000 mg | ORAL_TABLET | Freq: Four times a day (QID) | ORAL | Status: DC

## 2015-03-18 NOTE — Progress Notes (Signed)
Occupational Therapy Treatment Patient Details Name: [name] MRN: [id number] DOB: [birth year] Today's Date: 03/18/2015    History of present illness Pt is a 74 y/o F s/p L TKA.  Pt's PMH includes myelproliferative disorder, thrombocytosis, CMC arthritis, giant cell bone tumor, leukocytosis, chronic lung disease, gout, dysrhythmia, SOB, and morbid obesity.   OT comments  Patient progressing towards OT goals. Patient mod assist (+2-3 for safety) for [facility] transfer using RW. Patient's family with questions regarding purchasing a tub transfer bench. Recommended family wait until patient finishes with SNF rehab before purchasing any DME. Educated family on tub transfer bench and how to use it.    Follow Up Recommendations  SNF;Supervision/Assistance - 24 hour    Equipment Recommendations  Other (comment) (TBD next venue of care)    Recommendations for Other Services  None at this time   Precautions / Restrictions Precautions Precautions: Fall;Knee Precaution Comments: Reviewed no pillow under knee Required Braces or Orthoses: Knee Immobilizer - Left Knee Immobilizer - Left: On except when in CPM Restrictions Weight Bearing Restrictions: Yes LLE Weight Bearing: Weight bearing as tolerated       Mobility Bed Mobility General bed mobility comments: Patient found seated in recliner upon OT entering room  Transfers Overall transfer level: Needs assistance Equipment used: Rolling walker (2 wheeled) Transfers: Sit to/from [facility] Sit to Stand: Mod assist;+2 physical assistance Stand pivot transfers: Mod assist;+2 physical assistance (+3 for positioning of BSC and chair) General transfer comment: Max multimodal cues for sequencing, technique, hand placement.    Balance Overall balance assessment: Needs assistance Sitting-balance support: No upper extremity supported;Feet supported Sitting balance-Leahy Scale: Fair     Standing balance support: Bilateral  upper extremity supported;During functional activity Standing balance-Leahy Scale: Poor   ADL Overall ADL's : Needs assistance/impaired General ADL Comments: Patient found seated in recliner with complaints of stomach cramps (9/10). Patient requesting to go to the BR. Patient stood with mod assist (+2). Patient then transferred > BSC, mod assist (+3 for safety).      Cognition   Behavior During Therapy: Flat affect Overall Cognitive Status: Within Functional Limits for tasks assessed                 Pertinent Vitals/ Pain       Pain Assessment: 0-10 Pain Score: 9  Pain Location: stomach Pain Descriptors / Indicators: Cramping Pain Intervention(s): Limited activity within patient's tolerance;Monitored during session;Repositioned         Frequency Min 2X/week     Progress Toward Goals  OT Goals(current goals can now be found in the care plan section)  Progress towards OT goals: Progressing toward goals     Plan Discharge plan remains appropriate       End of Session Equipment Utilized During Treatment: Rolling walker;Oxygen;Gait belt (2 liters of 02 via East ) CPM Left Knee CPM Left Knee: Off   Activity Tolerance Patient limited by pain;Patient limited by fatigue   Patient Left with call bell/phone within reach;with family/visitor present;in chair [facility])   Nurse Communication Other (comment) (Pt on BSC and family to call when pt finished)      Time: 6378-5885 OT Time Calculation (min): 20 min  Charges: OT General Charges $OT Visit: 1 Procedure OT Treatments $Self Care/Home Management : 8-22 mins  Costantino Kohlbeck , MS, OTR/L, CLT Pager: [phone]   03/18/2015, 11:00 AM

## 2015-03-18 NOTE — Discharge Summary (Signed)
Patient ID: [name] MRN: [id number] DOB/AGE: 1941/06/14 74 y.o.  Admit date: 03/15/2015 Discharge date: 03/18/2015  Admission Diagnoses:  Active Problems:   Status post total left knee replacement primary osteoarthritis , left knee  Discharge Diagnoses:  Active Problems:   Status post total left knee replacement  status post Procedure(s): COMPUTER ASSISTED TOTAL KNEE ARTHROPLASTY  Past Medical History  Diagnosis Date  . Arthritis   . Hypertension   . GERD (gastroesophageal reflux disease)   . Obesity, morbid (more than 100 lbs over ideal weight or BMI > 40) 08/2011    Ht 5'7", wt 280 lb  . Bronchitis, chronic obstructive since 2000's  . Shortness of breath     unable to lay flat due to dyspnea  . Hiatal hernia   . Hiatal hernia 11/19/2012  . Cancer     dx with endometrial cancer   . Heart murmur   . Myeloproliferative disorder, JAK-2 positive 01/05/2014  . Polycythemia vera(238.4) 01/18/2014  . Gout   . Pneumonia 09/2014  . Dysrhythmia   . PONV (postoperative nausea and vomiting)     post-op hypoxic resp failure 09/2011 and require Bi-Pap (possible r/t - pressure pulm edema vs asp PNA, COPD exacerbation, obesity hypoventilation    Surgeries: Procedure(s): COMPUTER ASSISTED TOTAL KNEE ARTHROPLASTY on 03/15/2015   Consultants:    Discharged Condition: Improved  Hospital Course: [name] is an 74 y.o. female who was admitted 03/15/2015 for operative treatment of left total knee replacement. Patient failed conservative treatments (please see the history and physical for the specifics) and had severe unremitting pain that affects sleep, daily activities and work/hobbies. After pre-op clearance, the patient was taken to the operating room on 03/15/2015 and underwent  Procedure(s): COMPUTER ASSISTED TOTAL KNEE ARTHROPLASTY.    Patient was given perioperative antibiotics: Anti-infectives    Start     Dose/Rate Route Frequency Ordered Stop   03/15/15 1830   ceFAZolin (ANCEF) IVPB 1 g/50 mL premix     1 g 100 mL/hr over 30 Minutes Intravenous Every 6 hours 03/15/15 1728 03/16/15 0629   03/15/15 1200  ceFAZolin (ANCEF) 3 g in dextrose 5 % 50 mL IVPB     3 g 160 mL/hr over 30 Minutes Intravenous To Surgery 03/14/15 1327 03/15/15 1236       Patient was given sequential compression devices and early ambulation to prevent DVT.   Patient benefited maximally from hospital stay and there were no complications. At the time of discharge, the patient was urinating/moving their bowels without difficulty, tolerating a regular diet, pain is controlled with oral pain medications and they have been cleared by PT/OT.   Recent vital signs: Patient Vitals for the past 24 hrs:  BP Temp Temp src Pulse Resp SpO2  03/18/15 0504 (!) 122/47 mmHg 98.5 F (36.9 C) - 79 18 93 %  03/17/15 2203 (!) 132/54 mmHg 98.6 F (37 C) Oral 77 18 95 %  03/17/15 1300 (!) 107/33 mmHg 98.4 F (36.9 C) - 65 16 98 %     Recent laboratory studies:  Recent Labs  03/16/15 0530 03/17/15 0549 03/18/15 0533  WBC 14.1* 11.6* 8.9  HGB 11.7* 10.2* 8.9*  HCT 34.3* 31.4* 27.2*  PLT 241 214 201  NA 137  --   --   K 4.2  --   --   CL 101  --   --   CO2 26  --   --   BUN 17  --   --  CREATININE 0.74  --   --   GLUCOSE 122*  --   --   CALCIUM 8.7*  --   --      Discharge Medications:     Medication List    STOP taking these medications        celecoxib 200 MG capsule  Commonly known as:  CELEBREX     doxylamine (Sleep) 25 MG tablet  Commonly known as:  UNISOM     multivitamin tablet     traMADol-acetaminophen 37.5-325 MG per tablet  Commonly known as:  ULTRACET      TAKE these medications        aspirin 325 MG EC tablet  Take 1 tablet (325 mg total) by mouth daily with breakfast.     atenolol 50 MG tablet  Commonly known as:  TENORMIN  Take 25 mg by mouth 2 (two) times daily.     cloNIDine 0.3 MG tablet  Commonly known as:  CATAPRES  Take 0.15 mg by mouth 2  (two) times daily.     diltiazem 180 MG 24 hr capsule  Commonly known as:  CARDIZEM CD  Take 180 mg by mouth at bedtime.     docusate sodium 100 MG capsule  Commonly known as:  COLACE  Take 100 mg by mouth as needed.     esomeprazole 40 MG capsule  Commonly known as:  NEXIUM  Take 1 capsule (40 mg total) by mouth every morning.     GAS RELIEF PO  Take by mouth as needed.     hydrochlorothiazide 25 MG tablet  Commonly known as:  HYDRODIURIL  Take 25 mg by mouth every morning.     HYDROcodone-acetaminophen 10-325 MG per tablet  Commonly known as:  NORCO  Take 1-2 tablets by mouth every 6 (six) hours as needed (breakthrough pain).     hydroxyurea 500 MG capsule  Commonly known as:  HYDREA  Take 2 capsules (1,000 mg total) by mouth daily.     losartan 100 MG tablet  Commonly known as:  COZAAR  Take 100 mg by mouth daily.     methocarbamol 500 MG tablet  Commonly known as:  ROBAXIN  Take 1 tablet (500 mg total) by mouth 4 (four) times daily.     simvastatin 10 MG tablet  Commonly known as:  ZOCOR  Take 10 mg by mouth daily after supper.        Diagnostic Studies: Dg Chest 2 View  03/12/2015   CLINICAL DATA:  Preop for knee arthroplasty  EXAM: CHEST  2 VIEW  COMPARISON:  02/27/2014  FINDINGS: Cardiomediastinal silhouette is stable. There is chronic elevation of the right hemidiaphragm. Mild basilar atelectasis. Stable linear atelectasis or scarring in lingula. Mild degenerative changes thoracic spine. No acute infiltrate or pulmonary edema.  IMPRESSION: No active disease. Chronic elevation of the right hemidiaphragm again noted. Mild basilar atelectasis. Stable linear atelectasis or scarring in lingula.   Electronically Signed   By: Lahoma Crocker M.D.   On: 03/12/2015 10:10        Discharge Instructions    Call MD / Call 911    Complete by:  As directed   If you experience chest pain or shortness of breath, CALL 911 and be transported to the hospital emergency room.  If you  develope a fever above 101 F, pus (white drainage) or increased drainage or redness at the wound, or calf pain, call your surgeon's office.     Change dressing  Complete by:  As directed   Change dressing on left knee daily with sterile 4 x 4 inch gauze dressing and apply TED hose.     Constipation Prevention    Complete by:  As directed   Drink plenty of fluids.  Prune juice may be helpful.  You may use a stool softener, such as Colace (over the counter) 100 mg twice a day.  Use MiraLax (over the counter) for constipation as needed.     Diet - low sodium heart healthy    Complete by:  As directed      Discharge instructions    Complete by:  As directed   Ok to shower, but no tub soaking.  Do not apply any creams or ointments to incision.  Continue physical therapy protocol.  Weightbear as tolerated with walker.     Do not put a pillow under the knee. Place it under the heel.    Complete by:  As directed      Driving restrictions    Complete by:  As directed   No driving     Increase activity slowly as tolerated    Complete by:  As directed      Lifting restrictions    Complete by:  As directed   No lifting     TED hose    Complete by:  As directed   Use stockings (TED hose) for 3-4 weeks on both leg(s).  You may remove them at night for sleeping.             Discharge Plan:  discharge to snf when bed available.       Signed: Lanae Crumbly  [facility] Lori Perna MD [phone]  03/18/2015, 8:37 AM

## 2015-03-18 NOTE — Progress Notes (Signed)
Patient discharged to [facility] via non -emergent transport. RN called report to nurse at [facility].

## 2015-03-18 NOTE — Progress Notes (Signed)
Subjective: 3 Days Post-Op Procedure(s) (LRB): COMPUTER ASSISTED TOTAL KNEE ARTHROPLASTY (Left) Patient reports pain as 3 on 0-10 scale.    Objective: Vital signs in last 24 hours: Temp:  [98.4 F (36.9 C)-98.6 F (37 C)] 98.5 F (36.9 C) (05/12 0504) Pulse Rate:  [65-79] 79 (05/12 0504) Resp:  [16-18] 18 (05/12 0504) BP: (107-132)/(33-54) 122/47 mmHg (05/12 0504) SpO2:  [93 %-98 %] 93 % (05/12 0504)  Intake/Output from previous day: 05/11 0701 - 05/12 0700 In: 960 [P.O.:960] Out: -  Intake/Output this shift:     Recent Labs  03/16/15 0530 03/17/15 0549 03/18/15 0533  HGB 11.7* 10.2* 8.9*    Recent Labs  03/17/15 0549 03/18/15 0533  WBC 11.6* 8.9  RBC 2.81* 2.43*  HCT 31.4* 27.2*  PLT 214 201    Recent Labs  03/16/15 0530  NA 137  K 4.2  CL 101  CO2 26  BUN 17  CREATININE 0.74  GLUCOSE 122*  CALCIUM 8.7*   No results for input(s): LABPT, INR in the last 72 hours.  Neurologically intact  Assessment/Plan: 3 Days Post-Op Procedure(s) (LRB): COMPUTER ASSISTED TOTAL KNEE ARTHROPLASTY (Left) Discharge to SNF when bed available.   Tanajah Boulter C 03/18/2015, 7:25 AM

## 2015-03-18 NOTE — Plan of Care (Signed)
Problem: Consults Goal: Diagnosis- Total Joint Replacement Outcome: Completed/Met Date Met:  03/18/15 Primary Total Knee Left

## 2015-03-18 NOTE — Discharge Planning (Signed)
Patient will discharge today per MD order. Patient will discharge to: [facility] RN to call report prior to transportatio to [phone] Transportation: [facility]  CSW sent discharge summary to SNF for review.  Packet is complete.  RN, patient and family aware of discharge plans.  Nonnie Done, [place] [phone]  Psychiatric & Orthopedics (5N 1-16) Clinical Social Worker

## 2015-03-22 NOTE — Progress Notes (Signed)
Patient ID: [name], female   DOB: Feb 13, 1941, 74 y.o.   MRN: [id number] Patient has primary osteoarthritis of her left  knee.

## 2015-03-23 ENCOUNTER — Other Ambulatory Visit (HOSPITAL_COMMUNITY): Payer: Self-pay

## 2015-03-23 DIAGNOSIS — D72829 Elevated white blood cell count, unspecified: Secondary | ICD-10-CM

## 2015-03-23 DIAGNOSIS — Z96659 Presence of unspecified artificial knee joint: Secondary | ICD-10-CM | POA: Diagnosis not present

## 2015-03-23 DIAGNOSIS — D471 Chronic myeloproliferative disease: Secondary | ICD-10-CM

## 2015-03-30 ENCOUNTER — Encounter (HOSPITAL_COMMUNITY): Payer: Medicare Other | Attending: Hematology & Oncology | Admitting: Hematology & Oncology

## 2015-03-30 ENCOUNTER — Ambulatory Visit (HOSPITAL_COMMUNITY): Payer: Self-pay | Admitting: Hematology & Oncology

## 2015-03-30 ENCOUNTER — Encounter (HOSPITAL_COMMUNITY): Payer: Self-pay | Admitting: Hematology & Oncology

## 2015-03-30 ENCOUNTER — Other Ambulatory Visit (HOSPITAL_COMMUNITY): Payer: Self-pay

## 2015-03-30 ENCOUNTER — Other Ambulatory Visit (HOSPITAL_COMMUNITY)
Admission: AD | Admit: 2015-03-30 | Discharge: 2015-03-30 | Disposition: A | Discharge status: Home / Self Care | Payer: Medicare Other | Source: Skilled Nursing Facility | Attending: Internal Medicine | Admitting: Internal Medicine

## 2015-03-30 VITALS — BP 113/64 | HR 71 | Temp 98.9°F | Resp 16

## 2015-03-30 DIAGNOSIS — D473 Essential (hemorrhagic) thrombocythemia: Secondary | ICD-10-CM | POA: Diagnosis not present

## 2015-03-30 DIAGNOSIS — D471 Chronic myeloproliferative disease: Secondary | ICD-10-CM

## 2015-03-30 DIAGNOSIS — D75839 Thrombocytosis, unspecified: Secondary | ICD-10-CM

## 2015-03-30 DIAGNOSIS — Z471 Aftercare following joint replacement surgery: Secondary | ICD-10-CM | POA: Insufficient documentation

## 2015-03-30 LAB — CBC WITH DIFFERENTIAL/PLATELET
BASOS ABS: 0 10*3/uL (ref 0.0–0.1)
Basophils Relative: 0 % (ref 0–1)
Eosinophils Absolute: 0.1 10*3/uL (ref 0.0–0.7)
Eosinophils Relative: 2 % (ref 0–5)
HCT: 32 % — ABNORMAL LOW (ref 36.0–46.0)
HEMOGLOBIN: 10.2 g/dL — AB (ref 12.0–15.0)
LYMPHS PCT: 19 % (ref 12–46)
Lymphs Abs: 1.1 10*3/uL (ref 0.7–4.0)
MCH: 36.2 pg — ABNORMAL HIGH (ref 26.0–34.0)
MCHC: 31.9 g/dL (ref 30.0–36.0)
MCV: 113.5 fL — ABNORMAL HIGH (ref 78.0–100.0)
MONOS PCT: 8 % (ref 3–12)
Monocytes Absolute: 0.5 10*3/uL (ref 0.1–1.0)
Neutro Abs: 4.1 10*3/uL (ref 1.7–7.7)
Neutrophils Relative %: 71 % (ref 43–77)
Platelets: 363 10*3/uL (ref 150–400)
RBC: 2.82 MIL/uL — AB (ref 3.87–5.11)
RDW: 14 % (ref 11.5–15.5)
WBC: 5.8 10*3/uL (ref 4.0–10.5)

## 2015-03-30 LAB — COMPREHENSIVE METABOLIC PANEL
ALBUMIN: 3.5 g/dL (ref 3.5–5.0)
ALT: 14 U/L (ref 14–54)
ANION GAP: 6 (ref 5–15)
AST: 21 U/L (ref 15–41)
Alkaline Phosphatase: 94 U/L (ref 38–126)
BILIRUBIN TOTAL: 0.9 mg/dL (ref 0.3–1.2)
BUN: 12 mg/dL (ref 6–20)
CALCIUM: 9.2 mg/dL (ref 8.9–10.3)
CHLORIDE: 97 mmol/L — AB (ref 101–111)
CO2: 33 mmol/L — ABNORMAL HIGH (ref 22–32)
Creatinine, Ser: 0.65 mg/dL (ref 0.44–1.00)
GFR calc non Af Amer: >60 mL/min (ref >60)
GLUCOSE: 117 mg/dL — AB (ref 65–99)
Potassium: 3.8 mmol/L (ref 3.5–5.1)
SODIUM: 136 mmol/L (ref 135–145)
Total Protein: 7.6 g/dL (ref 6.5–8.1)

## 2015-03-30 NOTE — Patient Instructions (Signed)
[facility] Discharge Instructions  RECOMMENDATIONS MADE BY THE CONSULTANT AND ANY TEST RESULTS WILL BE SENT TO YOUR REFERRING PHYSICIAN.  Exam and discussion by Dr. Whitney Muse. Want to recheck labs in 1 month  Follow-up visit in 3 months.  Thank you for choosing [place] at [facility] to provide your oncology and hematology care.  To afford each patient quality time with our provider, please arrive at least 15 minutes before your scheduled appointment time.    You need to re-schedule your appointment should you arrive 10 or more minutes late.  We strive to give you quality time with our providers, and arriving late affects you and other patients whose appointments are after yours.  Also, if you no show three or more times for appointments you may be dismissed from the clinic at the providers discretion.     Again, thank you for choosing [facility].  Our hope is that these requests will decrease the amount of time that you wait before being seen by our physicians.       _____________________________________________________________  Should you have questions after your visit to [facility], please contact our office at [phone] between the hours of 8:30 a.m. and 4:30 p.m.  Voicemails left after 4:30 p.m. will not be returned until the following business day.  For prescription refill requests, have your pharmacy contact our office.

## 2015-03-30 NOTE — Progress Notes (Signed)
[name], [place] / [place] [address]   DIAGNOSIS: JAK 2 positive MPD, Essential Thrombocytosis Acute blood loss anemia secondary to recent knee replacement 03/2015 R wrist 3 views on 06/03/2013 ordered by Dr. Aline Brochure with eccentric lesion in the metaphyseal epiphyseal region approximately 12 x 8 mm Ddx includes giant cell tumor, benign bone lesion  CURRENT THERAPY: Hydrea  INTERVAL HISTORY: [name] 74 y.o. female returns for follow up of JAK 2 positive Essential Thrombocytosis.  She ambulates with a walker. She recently had her first knee replacement. She states is has been a lot more difficult than she anticipated. She has been at the [facility] for rehab.  She notes she became quite anemic after her knee replacement. They were thinking that they should give her blood for during her hospital stay, but they did not.    MEDICAL HISTORY: Past Medical History  Diagnosis Date  . Arthritis   . Hypertension   . GERD (gastroesophageal reflux disease)   . Obesity, morbid (more than 100 lbs over ideal weight or BMI > 40) 08/2011    Ht 5'7", wt 280 lb  . Bronchitis, chronic obstructive since 2000's  . Shortness of breath     unable to lay flat due to dyspnea  . Hiatal hernia   . Hiatal hernia 11/19/2012  . Cancer     dx with endometrial cancer   . Heart murmur   . Myeloproliferative disorder, JAK-2 positive 01/05/2014  . Polycythemia vera(238.4) 01/18/2014  . Gout   . Pneumonia 09/2014  . Dysrhythmia   . PONV (postoperative nausea and vomiting)     post-op hypoxic resp failure 09/2011 and require Bi-Pap (possible r/t - pressure pulm edema vs asp PNA, COPD exacerbation, obesity hypoventilation    has KNEE, ARTHRITIS, DEGEN./OSTEO; Chronic lung disease; Endometrial cancer; Acute respiratory failure; Bilateral pulmonary infiltrates on chest x-ray; Leukocytosis; Hyperglycemia; GERD (gastroesophageal reflux disease); Hiatal hernia; CMC arthritis, thumb,  degenerative; Bone, giant cell tumor; Thrombocytosis; Myeloproliferative disorder, JAK-2 positive; and Status post total left knee replacement on her problem list.     is allergic to carbamazepine and sulfa antibiotics.  Current Outpatient Prescriptions on File Prior to Visit  Medication Sig Dispense Refill  . aspirin EC 325 MG EC tablet Take 1 tablet (325 mg total) by mouth daily with breakfast. (Patient not taking: Reported on 03/30/2015) 30 tablet 0  . atenolol (TENORMIN) 50 MG tablet Take 25 mg by mouth 2 (two) times daily.     . cloNIDine (CATAPRES) 0.3 MG tablet Take 0.15 mg by mouth 2 (two) times daily.     Marland Kitchen diltiazem (CARDIZEM CD) 180 MG 24 hr capsule Take 180 mg by mouth at bedtime.     . docusate sodium (COLACE) 100 MG capsule Take 100 mg by mouth as needed.     Marland Kitchen esomeprazole (NEXIUM) 40 MG capsule Take 1 capsule (40 mg total) by mouth every morning. (Patient taking differently: Take 40 mg by mouth 2 (two) times daily as needed. ) 30 capsule 11  . hydrochlorothiazide 25 MG tablet Take 25 mg by mouth every morning.     Marland Kitchen HYDROcodone-acetaminophen (NORCO) 10-325 MG per tablet Take 1-2 tablets by mouth every 6 (six) hours as needed (breakthrough pain). 60 tablet 0  . hydroxyurea (HYDREA) 500 MG capsule Take 2 capsules (1,000 mg total) by mouth daily. 60 capsule 3  . losartan (COZAAR) 100 MG tablet Take 100 mg by mouth daily.     Marland Kitchen  methocarbamol (ROBAXIN) 500 MG tablet Take 1 tablet (500 mg total) by mouth 4 (four) times daily. 60 tablet 0  . Simethicone (GAS RELIEF PO) Take by mouth as needed.    . simvastatin (ZOCOR) 10 MG tablet Take 10 mg by mouth daily after supper.     No current facility-administered medications on file prior to visit.    SURGICAL HISTORY: Past Surgical History  Procedure Laterality Date  . Hysteroscopy w/d&c  08/15/2011    Procedure: DILATATION AND CURETTAGE (D&C) /HYSTEROSCOPY;  Surgeon: Jonnie Kind, MD;  Location: [facility];  Service: Gynecology;   Laterality: N/A;  With Suction Curette  . Abdominal hysterectomy  09/12/2011    Procedure: HYSTERECTOMY ABDOMINAL;  Surgeon: Janie Morning, MD;  Location: [facility];  Service: Gynecology;  Laterality: N/A;  Total Abdominal Hysterectomy, Bilateral Salpingo Oophorectomy  . Salpingoophorectomy  09/12/2011    Procedure: SALPINGO OOPHERECTOMY;  Surgeon: Janie Morning, MD;  Location: [facility];  Service: Gynecology;  Laterality: Bilateral;  . Colonoscopy  07/05/2012    Procedure: COLONOSCOPY;  Surgeon: Rogene Houston, MD;  [facility];  Service: Endoscopy;  Laterality: N/A;  730  . Knee arthroplasty Left 03/15/2015    Procedure: COMPUTER ASSISTED TOTAL KNEE ARTHROPLASTY;  Surgeon: Marybelle Killings, MD;  Location: [facility];  Service: Orthopedics;  Laterality: Left;    SOCIAL HISTORY: History   Social History  . Marital Status: Married    Spouse Name: N/A  . Number of Children: N/A  . Years of Education: N/A   Occupational History  . Not on file.   Social History Main Topics  . Smoking status: Never Smoker   . Smokeless tobacco: Never Used  . Alcohol Use: No  . Drug Use: No  . Sexual Activity: No     Comment: hysterectomy   Other Topics Concern  . Not on file   Social History Narrative  lives in [place]: Family History  Problem Relation Age of Onset  . Anesthesia problems Neg Hx   . Hypotension Neg Hx   . Malignant hyperthermia Neg Hx   . Pseudochol deficiency Neg Hx   . Other Mother   . Cancer Father   . Hypertension Son     Review of Systems  Constitutional: Positive for malaise/fatigue. Negative for fever, chills and weight loss.  HENT: Negative for hearing loss.   Eyes: Negative for blurred vision and double vision.  Respiratory: Negative for cough.   Cardiovascular: Negative for chest pain and palpitations.  Gastrointestinal: Negative for heartburn, nausea, vomiting, abdominal pain, diarrhea, constipation, blood in stool and melena.  Genitourinary:  Negative for dysuria and hematuria.  Musculoskeletal: Positive for joint pain. Negative for myalgias.  Skin: Negative for rash.  Neurological: Positive for weakness. Negative for dizziness, tingling, seizures, loss of consciousness and headaches.  Endo/Heme/Allergies: Negative.   Psychiatric/Behavioral: Negative for depression and suicidal ideas. The patient is not nervous/anxious and does not have insomnia.     PHYSICAL EXAMINATION  ECOG PERFORMANCE STATUS: 2 - Symptomatic, <50% confined to bed  Filed Vitals:   03/30/15 1324  BP: 113/64  Pulse: 71  Temp: 98.9 F (37.2 C)  Resp: 16    Physical Exam  Constitutional: She is oriented to person, place, and time and well-developed, well-nourished, and in no distress.  Patient in a wheelchair today. L knee is freshly bandaged. She just had a knee replacement  HENT:  Head: Normocephalic and atraumatic.  Nose: Nose normal.  Mouth/Throat: Oropharynx is clear  and moist. No oropharyngeal exudate.  Eyes: Conjunctivae and EOM are normal. Pupils are equal, round, and reactive to light. Right eye exhibits no discharge. Left eye exhibits no discharge. No scleral icterus.  Neck: Normal range of motion. Neck supple. No tracheal deviation present. No thyromegaly present.  Cardiovascular: Normal rate, regular rhythm and normal heart sounds.  Exam reveals no gallop and no friction rub.   No murmur heard. Pulmonary/Chest: Effort normal and breath sounds normal. She has no wheezes. She has no rales.  Abdominal: Soft. Bowel sounds are normal. There is no tenderness.  Musculoskeletal: Normal range of motion. She exhibits no edema.  Lymphadenopathy:    She has no cervical adenopathy.  Neurological: She is alert and oriented to person, place, and time. She has normal reflexes. No cranial nerve deficit. Gait normal. Coordination normal.  Skin: Skin is warm and dry. No rash noted.  Psychiatric: Mood, memory, affect and judgment normal.  Nursing note and  vitals reviewed.   LABORATORY DATA:  CBC    Component Value Date/Time   WBC 5.8 03/30/2015 0740   RBC 2.82* 03/30/2015 0740   RBC 5.53* 12/19/2013 1545   HGB 10.2* 03/30/2015 0740   HCT 32.0* 03/30/2015 0740   PLT 363 03/30/2015 0740   MCV 113.5* 03/30/2015 0740   MCH 36.2* 03/30/2015 0740   MCHC 31.9 03/30/2015 0740   RDW 14.0 03/30/2015 0740   LYMPHSABS 1.1 03/30/2015 0740   MONOABS 0.5 03/30/2015 0740   EOSABS 0.1 03/30/2015 0740   BASOSABS 0.0 03/30/2015 0740   CMP     Component Value Date/Time   NA 136 03/30/2015 0740   K 3.8 03/30/2015 0740   CL 97* 03/30/2015 0740   CO2 33* 03/30/2015 0740   GLUCOSE 117* 03/30/2015 0740   BUN 12 03/30/2015 0740   CREATININE 0.65 03/30/2015 0740   CREATININE 0.76 06/16/2013 0715   CALCIUM 9.2 03/30/2015 0740   PROT 7.6 03/30/2015 0740   ALBUMIN 3.5 03/30/2015 0740   AST 21 03/30/2015 0740   ALT 14 03/30/2015 0740   ALKPHOS 94 03/30/2015 0740   BILITOT 0.9 03/30/2015 0740   GFRNONAA >60 03/30/2015 0740   GFRAA >60 03/30/2015 0740     ASSESSMENT and THERAPY PLAN:  JAK 2 positive MPD, Essential Thrombocytosis Acute blood loss anemia secondary to recent knee replacement 03/2015 Overdue for screening mammogram  She will continue with her hydrea for now. I have reviewed her blood counts with her and advised her I would like closer follow-up for the next several months to ensure her anemia resolves. She is agreeable. We will recheck a CBC in the next several weeks.    Follow up in 3 months with a formal office visit and labs. Recommended mammography once she's feeling better and she agrees.   All questions were answered. The patient knows to call the clinic with any problems, questions or concerns. We can certainly see the patient much sooner if necessary.  This document serves as a record of services personally performed by Ancil Linsey, MD. It was created on her behalf by Pearlie Oyster, a trained medical scribe. The  creation of this record is based on the scribe's personal observations and the provider's statements to them. This document has been checked and approved by the attending provider.    I have reviewed the above documentation for accuracy and completeness, and I agree with the above. This note was electronically signed. Kelby Fam. Penland MD

## 2015-03-31 ENCOUNTER — Other Ambulatory Visit (HOSPITAL_COMMUNITY): Payer: Medicare Other

## 2015-03-31 ENCOUNTER — Ambulatory Visit (HOSPITAL_COMMUNITY): Payer: Medicare Other | Attending: Internal Medicine | Admitting: Physical Therapy

## 2015-03-31 ENCOUNTER — Ambulatory Visit (HOSPITAL_COMMUNITY): Payer: Medicare Other | Admitting: Hematology & Oncology

## 2015-03-31 ENCOUNTER — Ambulatory Visit (HOSPITAL_COMMUNITY): Payer: Self-pay | Admitting: Hematology & Oncology

## 2015-03-31 DIAGNOSIS — Z96652 Presence of left artificial knee joint: Secondary | ICD-10-CM | POA: Insufficient documentation

## 2015-03-31 DIAGNOSIS — R29898 Other symptoms and signs involving the musculoskeletal system: Secondary | ICD-10-CM

## 2015-03-31 DIAGNOSIS — M25562 Pain in left knee: Secondary | ICD-10-CM

## 2015-03-31 DIAGNOSIS — R262 Difficulty in walking, not elsewhere classified: Secondary | ICD-10-CM | POA: Insufficient documentation

## 2015-03-31 DIAGNOSIS — M25662 Stiffness of left knee, not elsewhere classified: Secondary | ICD-10-CM | POA: Insufficient documentation

## 2015-03-31 DIAGNOSIS — R6889 Other general symptoms and signs: Secondary | ICD-10-CM

## 2015-03-31 DIAGNOSIS — Z471 Aftercare following joint replacement surgery: Secondary | ICD-10-CM | POA: Insufficient documentation

## 2015-03-31 NOTE — Therapy (Signed)
[place], [place], [address] Phone: [phone]   Fax:  [phone]  Physical Therapy Evaluation  Patient Details  Name: [name] MRN: [id number] Date of Birth: Jun 23, 1941 Referring Provider:  [name], MD  Encounter Date: 03/31/2015      PT End of Session - 03/31/15 2006    Visit Number 1   Number of Visits 16   Date for PT Re-Evaluation 04/30/15   Authorization Type Medicare   Authorization Time Period 03/31/15 - 05/31/15   Authorization - Visit Number 1   Authorization - Number of Visits 10   PT Start Time 0998   PT Stop Time 1730   PT Time Calculation (min) 45 min   Behavior During Therapy [facility] for tasks assessed/performed      Past Medical History  Diagnosis Date  . Arthritis   . Hypertension   . GERD (gastroesophageal reflux disease)   . Obesity, morbid (more than 100 lbs over ideal weight or BMI > 40) 08/2011    Ht 5'7", wt 280 lb  . Bronchitis, chronic obstructive since 2000's  . Shortness of breath     unable to lay flat due to dyspnea  . Hiatal hernia   . Hiatal hernia 11/19/2012  . Cancer     dx with endometrial cancer   . Heart murmur   . Myeloproliferative disorder, JAK-2 positive 01/05/2014  . Polycythemia vera(238.4) 01/18/2014  . Gout   . Pneumonia 09/2014  . Dysrhythmia   . PONV (postoperative nausea and vomiting)     post-op hypoxic resp failure 09/2011 and require Bi-Pap (possible r/t - pressure pulm edema vs asp PNA, COPD exacerbation, obesity hypoventilation    Past Surgical History  Procedure Laterality Date  . Hysteroscopy w/d&c  08/15/2011    Procedure: DILATATION AND CURETTAGE (D&C) /HYSTEROSCOPY;  Surgeon: Jonnie Kind, MD;  Location: [facility];  Service: Gynecology;  Laterality: N/A;  With Suction Curette  . Abdominal hysterectomy  09/12/2011    Procedure: HYSTERECTOMY ABDOMINAL;  Surgeon: Janie Morning, MD;  [facility];  Service: Gynecology;  Laterality:  N/A;  Total Abdominal Hysterectomy, Bilateral Salpingo Oophorectomy  . Salpingoophorectomy  09/12/2011    Procedure: SALPINGO OOPHERECTOMY;  Surgeon: Janie Morning, MD;  Location: [facility];  Service: Gynecology;  Laterality: Bilateral;  . Colonoscopy  07/05/2012    Procedure: COLONOSCOPY;  Surgeon: Rogene Houston, MD;  [facility];  Service: Endoscopy;  Laterality: N/A;  730  . Knee arthroplasty Left 03/15/2015    Procedure: COMPUTER ASSISTED TOTAL KNEE ARTHROPLASTY;  Surgeon: Marybelle Killings, MD;  Location: [facility];  Service: Orthopedics;  Laterality: Left;    There were no vitals filed for this visit.  Visit Diagnosis:  Knee stiffness, left - Plan: PT plan of care cert/re-cert  Left knee pain - Plan: PT plan of care cert/re-cert  Weakness of left leg - Plan: PT plan of care cert/re-cert  Difficulty walking - Plan: PT plan of care cert/re-cert  Decreased functional activity tolerance - Plan: PT plan of care cert/re-cert      Subjective Assessment - 03/31/15 1654    Pertinent History opatient had Lt TKA 03/15/15, patient states " had always had knee pain fbores that, cortizone shots helped, but became decreasingly helpful with most recent shot providign no relief. Patient notes Rt knee pain as well resulting in cortizon shots into Rt knee. Patient was at [facility] center where she had 3 PT sessions a week  toincrease strength noting imrpoved strength.  Patient states "I do not want to do stairs due to Rt knee pain".  patient owns a 4 pronged cane.    How long can you sit comfortably? <165ft, < 41minutes secondary to increased fatigue and pain.    How long can you stand comfortably? about 10 minutes.    Patient Stated Goals Patient enjoys traveling and going to the grand kids ball games. to be able to walk withotu an assistive device.    Currently in Pain? No/denies   Pain Score 0-No pain   Pain Location Knee   Pain Orientation Left   Pain Descriptors / Indicators Sore   Pain Type Surgical  pain   Pain Frequency Intermittent            [facility] PT Assessment - 03/31/15 0001    Assessment   Medical Diagnosis Lt TKA   Onset Date/Surgical Date 03/15/15   Next MD Visit Lorin [facility]cy, 04/01/15   Prior Therapy yes 3 x.    Balance Screen   Has the patient fallen in the past 6 months No   Has the patient had a decrease in activity level because of a fear of falling?  No   Is the patient reluctant to leave their home because of a fear of falling?  No   Prior Function   Level of Independence Independent   Observation/Other Assessments   Focus on Therapeutic Outcomes (FOTO)  59% limited   ROM / Strength   AROM / PROM / Strength AROM;Strength   AROM   AROM Assessment Site Ankle   Right/Left Hip Right;Left   Right/Left Knee Right;Left   Right Knee Extension -17   Right Knee Flexion 90   Right/Left Ankle Right;Left   Strength   Strength Assessment Site Knee;Hip;Ankle   Right/Left Hip Right;Left   Right Hip Flexion 4+/5   Left Hip Flexion 2+/5   Right/Left Knee Right;Left   Right Knee Flexion 4-/5   Right Knee Extension 4+/5   Left Knee Flexion 4-/5   Left Knee Extension 4+/5   Right/Left Ankle Right;Left   Right Ankle Dorsiflexion 4/5   Left Ankle Dorsiflexion 4/5                           PT Education - 03/31/15 2005    Education provided Yes   Education Details Diagnosis, Prognosis explained, HEP given: SAQ, quad set, heelslide, heel raise, calf stretch   Person(s) Educated Patient;Spouse   Methods Explanation;Demonstration;Handout   Comprehension Verbalized understanding;Returned demonstration          PT Short Term Goals - 03/31/15 2014    PT SHORT TERM GOAL #1   Title Patint will demonstrate increased knee extension to 0 degrees to  be able to ambulate with a normlized stride length.    Time 4   Period Weeks   Status New   PT SHORT TERM GOAL #2   Title Patient will be able to demonstrate knee flexion > 100 degrees to be able to sit to chair  withtou compensating through uninvolved LE.    Time 4   Period Weeks   Status New   PT SHORT TERM GOAL #3   Title Patient will be able to dmeonstrate increased standing tolerance to >54minutes to perform stanidng exrcises in therpay.    Time 4   Period Weeks   Status New   PT SHORT TERM GOAL #4   Title Patinet will be  able to ambualte with a SPC >77mintues to be able to ambualte short distances in and outside of home.    Time 4   Period Weeks   Status New           PT Long Term Goals - 2015/04/14 Feb 17, 2017    PT LONG TERM GOAL #1   Title Patient will dmeosntrate increased knee flexon of 120 degrees to be able to squat to a low chair on lt withtou compendsating through Rt LE.    Time 8   Period Weeks   Status New   PT LONG TERM GOAL #2   Title Patient will demosntrate increased hamstring/quadriceps strength of 5/5 to be able to ambualte with notmalized gait speed >1.2 m/s to be able to ambulate at community gait speed.    Time 8   Period Weeks   Status New   PT LONG TERM GOAL #3   Title Patient will be able to ambualte without assistive device >42minutes to be able to perform grocery shopping withtou assistance withotu pain.    Time 8   Period Weeks   Status New   PT LONG TERM GOAL #4   Title Patint will be indpeendent with advanced HEP.    Time 8   Period Weeks   Status New               Plan - 04-14-15 2007/02/18    Clinical Impression Statement Patient displays Lt knee stiffness s/p Lt knee TKA resulting in reakness and difficutly walking. ASpecifically aptient unable to walk >61minutes due to weakness and unsteadiness of gait. Patient will benefit from skilled pysical therapy to be bale to ambulate >80minutes withtou painto be able to perform walking required for grocery shopping.    Pt will benefit from skilled therapeutic intervention in order to improve on the following deficits Abnormal gait;Decreased strength;Difficulty walking;Impaired flexibility;Pain;Hypermobility    Rehab Potential Good   PT Frequency 2x / week   PT Duration 8 weeks   PT Treatment/Interventions Manual techniques;Therapeutic activities;Therapeutic exercise;Balance training;Patient/family education   PT Next Visit Plan Progress knee extensionand flexion ROM utilizing low intensity exercises due to limited patient toelrance. Introdue standing exercises per pain/activity tolerance   PT Home Exercise Plan SAQ, quad set, heel slide, calf stretch, and heel raises   Consulted and Agree with Plan of Care Patient          G-Codes - 04/14/2015 February 17, 2021    Functional Assessment Tool Used FOTO 59% limited   Functional Limitation Mobility: Walking and moving around   Mobility: Walking and Moving Around Current Status [phone]) At least 40 percent but less than 60 percent impaired, limited or restricted   Mobility: Walking and Moving Around Goal Status [phone]) At least 20 percent but less than 40 percent impaired, limited or restricted       Problem List Patient Active Problem List   Diagnosis Date Noted  . Status post total left knee replacement 03/15/2015  . Myeloproliferative disorder, JAK-2 positive 01/05/2014  . Thrombocytosis 12/19/2013  . CMC arthritis, thumb, degenerative 06/03/2013  . Bone, giant cell tumor 06/03/2013  . GERD (gastroesophageal reflux disease) 11/19/2012  . Hiatal hernia 11/19/2012  . Acute respiratory failure 09/13/2011  . Bilateral pulmonary infiltrates on chest x-ray 09/13/2011  . Leukocytosis 09/13/2011  . Hyperglycemia 09/13/2011  . Endometrial cancer 09/12/2011  . Chronic lung disease 08/16/2011    Class: Chronic  . KNEE, ARTHRITIS, DEGEN./OSTEO 05/24/2010   Devona Konig PT DPT [place]  [facility], [place], [address] Phone: [phone]   Fax:  [phone]

## 2015-03-31 NOTE — Patient Instructions (Addendum)
Quad Set   With other leg bent, foot flat, slowly tighten muscles on thigh of straight leg while counting out loud to 3. Repeat with other leg. Repeat 20 times. Do 2 sessions per day.  http://gt2.exer.us/276   Copyright  VHI. All rights reserved.    Short Arc [employer] a large can or rolled towel under leg. Straighten knee and leg. Hold 3 seconds.  Repeat 20 times. Do 2 sessions per day.  http://gt2.exer.us/366   Copyright  VHI. All rights reserved.   Heel Slides    Slide left heel along bed towards bottom use UE as needed to increase motion. . Hold for 10 seconds. Slide back to flat knee position. Repeat 10 times. Do 2 times a day. Repeat with other leg.  KNEE: Extension, Long Arc Quads - Sitting   Raise leg until knee is straight. 20 reps per set, 2 sets per day, 7 days per week  Copyright  VHI. All rights reserved.   Heel Raises   Stand with support. Tighten pelvic floor and hold. With knees straight, raise heels off ground. Hold 1 seconds. Repeat 20 times. Do 2 times a day.  Copyright  VHI. All rights reserved.   Calf Stretch   Place one leg forward, bent, other leg behind and straight. Lean forward keeping back heel flat. Hold 20 seconds while counting out loud. Repeat with other leg forward. Repeat 4 times. Do 2 sessions per day.  http://gt2.exer.us/478   Copyright  VHI. All rights reserved.

## 2015-03-31 NOTE — Therapy (Deleted)
[place], [place], [address] Phone: [phone]   Fax:  [phone]  Physical Therapy Treatment  Patient Details  Name: [name] MRN: [id number] Date of Birth: [birth year] Referring Provider:  [name], MD  Encounter Date: 03/31/2015      PT End of Session - 03/31/15 2006    Visit Number 1   Number of Visits 16   Date for PT Re-Evaluation 04/30/15   Authorization Type Medicare   Authorization Time Period 03/31/15 - 05/31/15   Authorization - Visit Number 1   Authorization - Number of Visits 10   PT Start Time 1517   PT Stop Time 1730   PT Time Calculation (min) 45 min   Behavior During Therapy [facility] for tasks assessed/performed      Past Medical History  Diagnosis Date  . Arthritis   . Hypertension   . GERD (gastroesophageal reflux disease)   . Obesity, morbid (more than 100 lbs over ideal weight or BMI > 40) 08/2011    Ht 5'7", wt 280 lb  . Bronchitis, chronic obstructive since 2000's  . Shortness of breath     unable to lay flat due to dyspnea  . Hiatal hernia   . Hiatal hernia 11/19/2012  . Cancer     dx with endometrial cancer   . Heart murmur   . Myeloproliferative disorder, JAK-2 positive 01/05/2014  . Polycythemia vera(238.4) 01/18/2014  . Gout   . Pneumonia 09/2014  . Dysrhythmia   . PONV (postoperative nausea and vomiting)     post-op hypoxic resp failure 09/2011 and require Bi-Pap (possible r/t - pressure pulm edema vs asp PNA, COPD exacerbation, obesity hypoventilation    Past Surgical History  Procedure Laterality Date  . Hysteroscopy w/d&c  08/15/2011    Procedure: DILATATION AND CURETTAGE (D&C) /HYSTEROSCOPY;  Surgeon: Jonnie Kind, MD;  Location: [facility];  Service: Gynecology;  Laterality: N/A;  With Suction Curette  . Abdominal hysterectomy  09/12/2011    Procedure: HYSTERECTOMY ABDOMINAL;  Surgeon: Janie Morning, MD;  Location: [facility];  Service: Gynecology;  Laterality:  N/A;  Total Abdominal Hysterectomy, Bilateral Salpingo Oophorectomy  . Salpingoophorectomy  09/12/2011    Procedure: SALPINGO OOPHERECTOMY;  Surgeon: Janie Morning, MD;  Location: [facility];  Service: Gynecology;  Laterality: Bilateral;  . Colonoscopy  07/05/2012    Procedure: COLONOSCOPY;  Surgeon: Rogene Houston, MD;  [facility];  Service: Endoscopy;  Laterality: N/A;  730  . Knee arthroplasty Left 03/15/2015    Procedure: COMPUTER ASSISTED TOTAL KNEE ARTHROPLASTY;  Surgeon: Marybelle Killings, MD;  Location: [place];  Service: Orthopedics;  Laterality: Left;    There were no vitals filed for this visit.  Visit Diagnosis:  Knee stiffness, left - Plan: PT plan of care cert/re-cert  Left knee pain - Plan: PT plan of care cert/re-cert  Weakness of left leg - Plan: PT plan of care cert/re-cert  Difficulty walking - Plan: PT plan of care cert/re-cert  Decreased functional activity tolerance - Plan: PT plan of care cert/re-cert      Subjective Assessment - 03/31/15 1654    Pertinent History opatient had Lt TKA 03/15/15, patient states " had always had knee pain fbores that, cortizone shots helped, but became decreasingly helpful with most recent shot providign no relief. Patient notes Rt knee pain as well resulting in cortizon shots into Rt knee. Patient was at [facility] center where she had 3 PT sessions a week  toincrease strength noting imrpoved strength.  Patient states "I do not want to do stairs due to Rt knee pain".  patient owns a 4 pronged cane.    How long can you sit comfortably? <1100ft, < 58minutes secondary to increased fatigue and pain.    How long can you stand comfortably? about 10 minutes.    Patient Stated Goals Patient enjoys traveling and going to the grand kids ball games. to be able to walk withotu an assistive device.    Currently in Pain? No/denies   Pain Score 0-No pain   Pain Location Knee   Pain Orientation Left   Pain Descriptors / Indicators Sore   Pain Type Surgical  pain   Pain Frequency Intermittent            [facility] PT Assessment - 03/31/15 0001    Assessment   Medical Diagnosis Lt TKA   Onset Date/Surgical Date 03/15/15   Next MD Visit [facility], 04/01/15   Prior Therapy yes 3 x.    Balance Screen   Has the patient fallen in the past 6 months No   Has the patient had a decrease in activity level because of a fear of falling?  No   Is the patient reluctant to leave their home because of a fear of falling?  No   Prior Function   Level of Independence Independent   Observation/Other Assessments   Focus on Therapeutic Outcomes (FOTO)  59% limited   ROM / Strength   AROM / PROM / Strength AROM;Strength   AROM   AROM Assessment Site Ankle   Right/Left Hip Right;Left   Right/Left Knee Right;Left   Right Knee Extension -17   Right Knee Flexion 90   Right/Left Ankle Right;Left   Strength   Strength Assessment Site Knee;Hip;Ankle   Right/Left Hip Right;Left   Right Hip Flexion 4+/5   Left Hip Flexion 2+/5   Right/Left Knee Right;Left   Right Knee Flexion 4-/5   Right Knee Extension 4+/5   Left Knee Flexion 4-/5   Left Knee Extension 4+/5   Right/Left Ankle Right;Left   Right Ankle Dorsiflexion 4/5   Left Ankle Dorsiflexion 4/5                             PT Education - 03/31/15 2005    Education provided Yes   Education Details Diagnosis, Prognosis explained, HEP given: SAQ, quad set, heelslide, heel raise, calf stretch   Person(s) Educated Patient;Spouse   Methods Explanation;Demonstration;Handout   Comprehension Verbalized understanding;Returned demonstration          PT Short Term Goals - 03/31/15 2014    PT SHORT TERM GOAL #1   Title Patint will demonstrate increased knee extension to 0 degrees to  be able to ambulate with a normlized stride length.    Time 4   Period Weeks   Status New   PT SHORT TERM GOAL #2   Title Patient will be able to demonstrate knee flexion > 100 degrees to be able to sit to  chair withtou compensating through uninvolved LE.    Time 4   Period Weeks   Status New   PT SHORT TERM GOAL #3   Title Patient will be able to dmeonstrate increased standing tolerance to >65minutes to perform stanidng exrcises in therpay.    Time 4   Period Weeks   Status New   PT SHORT TERM GOAL #4   Title Patinet  will be able to ambualte with a SPC >70mintues to be able to ambualte short distances in and outside of home.    Time 4   Period Weeks   Status New           PT Long Term Goals - 2015/04/28 2017/03/03    PT LONG TERM GOAL #1   Title Patient will dmeosntrate increased knee flexon of 120 degrees to be able to squat to a low chair on lt withtou compendsating through Rt LE.    Time 8   Period Weeks   Status New   PT LONG TERM GOAL #2   Title Patient will demosntrate increased hamstring/quadriceps strength of 5/5 to be able to ambualte with notmalized gait speed >1.2 m/s to be able to ambulate at community gait speed.    Time 8   Period Weeks   Status New   PT LONG TERM GOAL #3   Title Patient will be able to ambualte without assistive device >37minutes to be able to perform grocery shopping withtou assistance withotu pain.    Time 8   Period Weeks   Status New   PT LONG TERM GOAL #4   Title Patint will be indpeendent with advanced HEP.    Time 8   Period Weeks   Status New               Plan - 04-28-2015 03/04/2007    Clinical Impression Statement Patient displays Lt knee stiffness s/p Lt knee TKA resulting in reakness and difficutly walking. ASpecifically aptient unable to walk >3minutes due to weakness and unsteadiness of gait. Patient will benefit from skilled pysical therapy to be bale to ambulate >24minutes withtou painto be able to perform walking required for grocery shopping.    Pt will benefit from skilled therapeutic intervention in order to improve on the following deficits Abnormal gait;Decreased strength;Difficulty walking;Impaired  flexibility;Pain;Hypermobility   Rehab Potential Good   PT Frequency 2x / week   PT Duration 8 weeks   PT Treatment/Interventions Manual techniques;Therapeutic activities;Therapeutic exercise;Balance training;Patient/family education   PT Next Visit Plan Progress knee extensionand flexion ROM utilizing low intensity exercises due to limited patient toelrance. Introdue standing exercises per pain/activity tolerance   PT Home Exercise Plan SAQ, quad set, heel slide, calf stretch, and heel raises   Consulted and Agree with Plan of Care Patient          G-Codes - 28-Apr-2015 03-03-2021    Functional Assessment Tool Used FOTO 59% limited   Functional Limitation Mobility: Walking and moving around   Mobility: Walking and Moving Around Current Status [phone]) At least 40 percent but less than 60 percent impaired, limited or restricted   Mobility: Walking and Moving Around Goal Status [phone]) At least 20 percent but less than 40 percent impaired, limited or restricted      Problem List Patient Active Problem List   Diagnosis Date Noted  . Status post total left knee replacement 03/15/2015  . Myeloproliferative disorder, JAK-2 positive 01/05/2014  . Thrombocytosis 12/19/2013  . CMC arthritis, thumb, degenerative 06/03/2013  . Bone, giant cell tumor 06/03/2013  . GERD (gastroesophageal reflux disease) 11/19/2012  . Hiatal hernia 11/19/2012  . Acute respiratory failure 09/13/2011  . Bilateral pulmonary infiltrates on chest x-ray 09/13/2011  . Leukocytosis 09/13/2011  . Hyperglycemia 09/13/2011  . Endometrial cancer 09/12/2011  . Chronic lung disease 08/16/2011    Class: Chronic  . KNEE, ARTHRITIS, DEGEN./OSTEO 05/24/2010   Devona Konig PT DPT [phone]  [facility]  [facility] [place], [place], [address] Phone: [phone]   Fax:  [phone]

## 2015-04-01 DIAGNOSIS — M1712 Unilateral primary osteoarthritis, left knee: Secondary | ICD-10-CM | POA: Diagnosis not present

## 2015-04-02 ENCOUNTER — Other Ambulatory Visit: Payer: Self-pay

## 2015-04-02 ENCOUNTER — Inpatient Hospital Stay (HOSPITAL_COMMUNITY)
Admission: EM | Admit: 2015-04-02 | Discharge: 2015-04-04 | DRG: 394 | Disposition: A | Discharge status: Home / Self Care | Payer: Medicare Other | Attending: Internal Medicine | Admitting: Internal Medicine

## 2015-04-02 ENCOUNTER — Encounter (HOSPITAL_COMMUNITY): Payer: Self-pay | Admitting: *Deleted

## 2015-04-02 DIAGNOSIS — D62 Acute posthemorrhagic anemia: Secondary | ICD-10-CM | POA: Diagnosis not present

## 2015-04-02 DIAGNOSIS — J42 Unspecified chronic bronchitis: Secondary | ICD-10-CM | POA: Diagnosis not present

## 2015-04-02 DIAGNOSIS — Z6841 Body Mass Index (BMI) 40.0 and over, adult: Secondary | ICD-10-CM | POA: Diagnosis not present

## 2015-04-02 DIAGNOSIS — J449 Chronic obstructive pulmonary disease, unspecified: Secondary | ICD-10-CM

## 2015-04-02 DIAGNOSIS — I4510 Unspecified right bundle-branch block: Secondary | ICD-10-CM | POA: Diagnosis not present

## 2015-04-02 DIAGNOSIS — D649 Anemia, unspecified: Secondary | ICD-10-CM | POA: Diagnosis present

## 2015-04-02 DIAGNOSIS — K921 Melena: Secondary | ICD-10-CM | POA: Diagnosis not present

## 2015-04-02 DIAGNOSIS — K529 Noninfective gastroenteritis and colitis, unspecified: Secondary | ICD-10-CM | POA: Diagnosis not present

## 2015-04-02 DIAGNOSIS — I10 Essential (primary) hypertension: Secondary | ICD-10-CM | POA: Diagnosis not present

## 2015-04-02 DIAGNOSIS — Z7982 Long term (current) use of aspirin: Secondary | ICD-10-CM | POA: Diagnosis not present

## 2015-04-02 DIAGNOSIS — K5190 Ulcerative colitis, unspecified, without complications: Secondary | ICD-10-CM | POA: Diagnosis not present

## 2015-04-02 DIAGNOSIS — K625 Hemorrhage of anus and rectum: Secondary | ICD-10-CM | POA: Diagnosis not present

## 2015-04-02 DIAGNOSIS — Z79899 Other long term (current) drug therapy: Secondary | ICD-10-CM

## 2015-04-02 DIAGNOSIS — K559 Vascular disorder of intestine, unspecified: Secondary | ICD-10-CM | POA: Diagnosis not present

## 2015-04-02 DIAGNOSIS — D45 Polycythemia vera: Secondary | ICD-10-CM | POA: Diagnosis not present

## 2015-04-02 DIAGNOSIS — K449 Diaphragmatic hernia without obstruction or gangrene: Secondary | ICD-10-CM

## 2015-04-02 DIAGNOSIS — K219 Gastro-esophageal reflux disease without esophagitis: Secondary | ICD-10-CM | POA: Diagnosis not present

## 2015-04-02 DIAGNOSIS — M109 Gout, unspecified: Secondary | ICD-10-CM | POA: Diagnosis not present

## 2015-04-02 DIAGNOSIS — I499 Cardiac arrhythmia, unspecified: Secondary | ICD-10-CM | POA: Insufficient documentation

## 2015-04-02 DIAGNOSIS — M1990 Unspecified osteoarthritis, unspecified site: Secondary | ICD-10-CM | POA: Insufficient documentation

## 2015-04-02 DIAGNOSIS — E669 Obesity, unspecified: Secondary | ICD-10-CM | POA: Insufficient documentation

## 2015-04-02 DIAGNOSIS — Z8542 Personal history of malignant neoplasm of other parts of uterus: Secondary | ICD-10-CM

## 2015-04-02 DIAGNOSIS — J4489 Other specified chronic obstructive pulmonary disease: Secondary | ICD-10-CM | POA: Diagnosis present

## 2015-04-02 DIAGNOSIS — Z96652 Presence of left artificial knee joint: Secondary | ICD-10-CM

## 2015-04-02 DIAGNOSIS — C946 Myelodysplastic disease, not classified: Secondary | ICD-10-CM | POA: Diagnosis not present

## 2015-04-02 DIAGNOSIS — D638 Anemia in other chronic diseases classified elsewhere: Secondary | ICD-10-CM | POA: Diagnosis not present

## 2015-04-02 DIAGNOSIS — K633 Ulcer of intestine: Secondary | ICD-10-CM | POA: Diagnosis present

## 2015-04-02 DIAGNOSIS — R1030 Lower abdominal pain, unspecified: Secondary | ICD-10-CM | POA: Diagnosis not present

## 2015-04-02 DIAGNOSIS — D471 Chronic myeloproliferative disease: Secondary | ICD-10-CM | POA: Diagnosis present

## 2015-04-02 HISTORY — DX: Obesity, unspecified: E66.9

## 2015-04-02 HISTORY — DX: Anemia, unspecified: D64.9

## 2015-04-02 LAB — CBC WITH DIFFERENTIAL/PLATELET
Basophils Absolute: 0 10*3/uL (ref 0.0–0.1)
Basophils Relative: 0 % (ref 0–1)
Eosinophils Absolute: 0 10*3/uL (ref 0.0–0.7)
Eosinophils Relative: 1 % (ref 0–5)
HCT: 29.1 % — ABNORMAL LOW (ref 36.0–46.0)
Hemoglobin: 9.5 g/dL — ABNORMAL LOW (ref 12.0–15.0)
Lymphocytes Relative: 10 % — ABNORMAL LOW (ref 12–46)
Lymphs Abs: 0.9 10*3/uL (ref 0.7–4.0)
MCH: 36.8 pg — ABNORMAL HIGH (ref 26.0–34.0)
MCHC: 32.6 g/dL (ref 30.0–36.0)
MCV: 112.8 fL — ABNORMAL HIGH (ref 78.0–100.0)
Monocytes Absolute: 0.7 10*3/uL (ref 0.1–1.0)
Monocytes Relative: 8 % (ref 3–12)
Neutro Abs: 7 10*3/uL (ref 1.7–7.7)
Neutrophils Relative %: 82 % — ABNORMAL HIGH (ref 43–77)
Platelets: 301 10*3/uL (ref 150–400)
RBC: 2.58 MIL/uL — ABNORMAL LOW (ref 3.87–5.11)
RDW: 13.9 % (ref 11.5–15.5)
WBC: 8.6 10*3/uL (ref 4.0–10.5)

## 2015-04-02 LAB — BASIC METABOLIC PANEL
Anion gap: 9 (ref 5–15)
BUN: 14 mg/dL (ref 6–20)
CO2: 28 mmol/L (ref 22–32)
Calcium: 8.7 mg/dL — ABNORMAL LOW (ref 8.9–10.3)
Chloride: 99 mmol/L — ABNORMAL LOW (ref 101–111)
Creatinine, Ser: 0.64 mg/dL (ref 0.44–1.00)
GFR calc Af Amer: >60 mL/min (ref >60)
GFR calc non Af Amer: >60 mL/min (ref >60)
Glucose, Bld: 119 mg/dL — ABNORMAL HIGH (ref 65–99)
Potassium: 3.3 mmol/L — ABNORMAL LOW (ref 3.5–5.1)
Sodium: 136 mmol/L (ref 135–145)

## 2015-04-02 LAB — TYPE AND SCREEN
ABO/RH(D): O POS
Antibody Screen: NEGATIVE

## 2015-04-02 LAB — CBC
HCT: 28 % — ABNORMAL LOW (ref 36.0–46.0)
Hemoglobin: 9 g/dL — ABNORMAL LOW (ref 12.0–15.0)
MCH: 36.3 pg — AB (ref 26.0–34.0)
MCHC: 32.1 g/dL (ref 30.0–36.0)
MCV: 112.9 fL — ABNORMAL HIGH (ref 78.0–100.0)
Platelets: 264 10*3/uL (ref 150–400)
RBC: 2.48 MIL/uL — ABNORMAL LOW (ref 3.87–5.11)
RDW: 14 % (ref 11.5–15.5)
WBC: 9.1 10*3/uL (ref 4.0–10.5)

## 2015-04-02 LAB — VITAMIN B12: Vitamin B-12: 464 pg/mL (ref 180–914)

## 2015-04-02 LAB — IRON AND TIBC
IRON: 18 ug/dL — AB (ref 28–170)
Saturation Ratios: 8 % — ABNORMAL LOW (ref 10.4–31.8)
TIBC: 237 ug/dL — ABNORMAL LOW (ref 250–450)
UIBC: 219 ug/dL

## 2015-04-02 LAB — RETICULOCYTES
RBC.: 2.43 MIL/uL — ABNORMAL LOW (ref 3.87–5.11)
Retic Count, Absolute: 92.3 10*3/uL (ref 19.0–186.0)
Retic Ct Pct: 3.8 % — ABNORMAL HIGH (ref 0.4–3.1)

## 2015-04-02 LAB — FOLATE: Folate: 16.7 ng/mL (ref >5.9)

## 2015-04-02 LAB — PROTIME-INR
INR: 1.15 (ref 0.00–1.49)
Prothrombin Time: 14.9 seconds (ref 11.6–15.2)

## 2015-04-02 LAB — MAGNESIUM: Magnesium: 1.7 mg/dL (ref 1.7–2.4)

## 2015-04-02 LAB — FERRITIN: FERRITIN: 149 ng/mL (ref 11–307)

## 2015-04-02 MED ORDER — SODIUM CHLORIDE 0.9 % IV SOLN
INTRAVENOUS | Status: DC
  Administered 2015-04-02: 16:00:00 via INTRAVENOUS

## 2015-04-02 MED ORDER — HYDROXYUREA 500 MG PO CAPS
1000.0000 mg | ORAL_CAPSULE | Freq: Every day | ORAL | Status: DC
  Administered 2015-04-03 – 2015-04-04 (×2): 1000 mg via ORAL
  Filled 2015-04-02 (×2): qty 2

## 2015-04-02 MED ORDER — SIMVASTATIN 10 MG PO TABS
10.0000 mg | ORAL_TABLET | Freq: Every day | ORAL | Status: DC
  Administered 2015-04-02 – 2015-04-03 (×2): 10 mg via ORAL
  Filled 2015-04-02 (×2): qty 1

## 2015-04-02 MED ORDER — SODIUM CHLORIDE 0.9 % IV SOLN
INTRAVENOUS | Status: DC
  Administered 2015-04-02: 12:00:00 via INTRAVENOUS

## 2015-04-02 MED ORDER — PANTOPRAZOLE SODIUM 40 MG IV SOLR: 40.0000 mg | Freq: Two times a day (BID) | INTRAVENOUS | Status: DC

## 2015-04-02 MED ORDER — ONDANSETRON HCL 4 MG/2ML IJ SOLN: 4.0000 mg | Freq: Four times a day (QID) | INTRAMUSCULAR | Status: DC | PRN

## 2015-04-02 MED ORDER — ACETAMINOPHEN 650 MG RE SUPP: 650.0000 mg | Freq: Four times a day (QID) | RECTAL | Status: DC | PRN

## 2015-04-02 MED ORDER — HYDROCODONE-ACETAMINOPHEN 10-325 MG PO TABS
1.0000 | ORAL_TABLET | Freq: Four times a day (QID) | ORAL | Status: DC | PRN
  Administered 2015-04-03 (×2): 1 via ORAL
  Filled 2015-04-02 (×2): qty 1

## 2015-04-02 MED ORDER — ALBUTEROL SULFATE HFA 108 (90 BASE) MCG/ACT IN AERS: 2.0000 | INHALATION_SPRAY | Freq: Four times a day (QID) | RESPIRATORY_TRACT | Status: DC | PRN

## 2015-04-02 MED ORDER — POTASSIUM CHLORIDE CRYS ER 20 MEQ PO TBCR
40.0000 meq | EXTENDED_RELEASE_TABLET | Freq: Once | ORAL | Status: AC
  Administered 2015-04-02: 40 meq via ORAL
  Filled 2015-04-02: qty 2

## 2015-04-02 MED ORDER — SODIUM CHLORIDE 0.9 % IJ SOLN
3.0000 mL | Freq: Two times a day (BID) | INTRAMUSCULAR | Status: DC
  Administered 2015-04-02: 3 mL via INTRAVENOUS

## 2015-04-02 MED ORDER — ALBUTEROL SULFATE (2.5 MG/3ML) 0.083% IN NEBU: 2.5000 mg | INHALATION_SOLUTION | Freq: Four times a day (QID) | RESPIRATORY_TRACT | Status: DC | PRN

## 2015-04-02 MED ORDER — ACETAMINOPHEN 325 MG PO TABS
650.0000 mg | ORAL_TABLET | Freq: Four times a day (QID) | ORAL | Status: DC | PRN
Start: 2015-04-02 — End: 2015-04-04

## 2015-04-02 MED ORDER — BOOST / RESOURCE BREEZE PO LIQD
1.0000 | Freq: Three times a day (TID) | ORAL | Status: DC
  Administered 2015-04-02 (×2): 1 via ORAL

## 2015-04-02 MED ORDER — CLONIDINE HCL 0.2 MG PO TABS
0.2000 mg | ORAL_TABLET | Freq: Two times a day (BID) | ORAL | Status: DC
  Administered 2015-04-02 – 2015-04-04 (×4): 0.2 mg via ORAL
  Filled 2015-04-02 (×4): qty 1

## 2015-04-02 MED ORDER — ONDANSETRON HCL 4 MG PO TABS: 4.0000 mg | ORAL_TABLET | Freq: Four times a day (QID) | ORAL | Status: DC | PRN

## 2015-04-02 MED ORDER — TRAZODONE HCL 50 MG PO TABS
50.0000 mg | ORAL_TABLET | Freq: Every day | ORAL | Status: DC
  Administered 2015-04-02 – 2015-04-03 (×2): 50 mg via ORAL
  Filled 2015-04-02 (×2): qty 1

## 2015-04-02 MED ORDER — PANTOPRAZOLE SODIUM 40 MG IV SOLR
40.0000 mg | INTRAVENOUS | Status: DC
  Administered 2015-04-02 – 2015-04-03 (×2): 40 mg via INTRAVENOUS
  Filled 2015-04-02 (×2): qty 40

## 2015-04-02 MED ORDER — MORPHINE SULFATE 2 MG/ML IJ SOLN
1.0000 mg | INTRAMUSCULAR | Status: DC | PRN
  Administered 2015-04-03: 1 mg via INTRAVENOUS
  Filled 2015-04-02: qty 1

## 2015-04-02 MED ORDER — POTASSIUM CHLORIDE IN NACL 20-0.9 MEQ/L-% IV SOLN
INTRAVENOUS | Status: DC
Start: 2015-04-02 — End: 2015-04-04
  Administered 2015-04-02: 19:00:00 via INTRAVENOUS
  Administered 2015-04-03: 1 mL via INTRAVENOUS
  Administered 2015-04-03: 13:00:00 via INTRAVENOUS
  Administered 2015-04-03: 1 mL via INTRAVENOUS

## 2015-04-02 MED ORDER — LOSARTAN POTASSIUM 50 MG PO TABS
25.0000 mg | ORAL_TABLET | Freq: Every day | ORAL | Status: DC
  Administered 2015-04-02 – 2015-04-04 (×2): 25 mg via ORAL
  Filled 2015-04-02 (×3): qty 1

## 2015-04-02 MED ORDER — SIMETHICONE 80 MG PO CHEW: 80.0000 mg | CHEWABLE_TABLET | Freq: Four times a day (QID) | ORAL | Status: DC | PRN

## 2015-04-02 NOTE — ED Notes (Signed)
Pt recently at [facility] knee rehab, DC Tues, pt noticed bright red blood in stool at 4pm yesterday, PCP instructed pt to come to ED. Pt states last bloody stool was 30 minutes ago, all liquid, all red.

## 2015-04-02 NOTE — H&P (Signed)
[facility] History and Physical  [name] GLO:[id number] DOB: 1941/04/07 DOA: 04/02/2015  Referring physician: Wilson Singer PCP: [name], MD   Chief Complaint: rectal bleeding  HPI: [name] is a 74 y.o. female with a past medical history that includes polycythemia vera, myeloproliferative disorder, hypertension, GERD, hiatal hernia, dysrhythmia, endometrial cancer status post hysterectomy, bronchitis, status post left knee replacement approximately 2 weeks ago presents to the emergency department with chief complaint of bright red blood per rectum. Initial evaluation reveals a hemoglobin of 9.5 (chart review indicates hemoglobin was 14 3 weeks ago), tests and level III.3 and blood pressure on the low end of normal.  She reports being in her usual state of health until about 4:00 yesterday afternoon when she developed bright red blood per rectum. She reports passing bright red blood every 15 minutes from 4 PM yesterday until 8 AM this morning. Associated symptoms include mild intermittent abdominal cramping mostly in the lower quadrants, generalized weakness. She reports taking Celebrex up until 3 weeks ago when she had her knee replaced. Since then she denies any NSAIDs or any tenderness. She does take aspirin whose dose was increased to 325 mg 3 weeks ago. She denies any transfusions when she had her knee replaced. She denies chest pain palpitation headache dizziness syncope or near-syncope. She denies any worsening shortness of breath she has a history of bronchitis but is not on oxygen at home. She does endorse orthopnea for the last several years. He denies nausea vomiting dysuria hematuria frequency or urgency. He denies fever chills or recent sick contacts. She does report having had a colonoscopy in 2013 use results reportedly were normal.  Workup in the emergency department includes complete blood count significant for RBCs 2.58 hemoglobin 9.5 hematocrit 18.8, basic metabolic  panel with potassium of 3.3 chloride 99 calcium 8.7 glucose 119. EKG with sinus rhythm and a right bundle branch block. She is afebrile hemodynamically stable with a blood pressure this on the low end of normal. In the emergency department IV normal saline at 100 mils an hour was initiated.  Review of Systems:  10 point review of systems complete and all systems are negative except as indicated in the history of present illness  Past Medical History  Diagnosis Date  . Arthritis   . Hypertension   . GERD (gastroesophageal reflux disease)   . Obesity, morbid (more than 100 lbs over ideal weight or BMI > 40) 08/2011    Ht 5'7", wt 280 lb  . Bronchitis, chronic obstructive since 2000's  . Shortness of breath     unable to lay flat due to dyspnea  . Hiatal hernia   . Hiatal hernia 11/19/2012  . Cancer     dx with endometrial cancer   . Heart murmur   . Myeloproliferative disorder, JAK-2 positive 01/05/2014  . Polycythemia vera(238.4) 01/18/2014  . Gout   . Pneumonia 09/2014  . Dysrhythmia   . PONV (postoperative nausea and vomiting)     post-op hypoxic resp failure 09/2011 and require Bi-Pap (possible r/t - pressure pulm edema vs asp PNA, COPD exacerbation, obesity hypoventilation  . Anemia 04/02/2015   Past Surgical History  Procedure Laterality Date  . Hysteroscopy w/d&c  08/15/2011    Procedure: DILATATION AND CURETTAGE (D&C) /HYSTEROSCOPY;  Surgeon: Jonnie Kind, MD;  Location: [facility];  Service: Gynecology;  Laterality: N/A;  With Suction Curette  . Abdominal hysterectomy  09/12/2011    Procedure: HYSTERECTOMY ABDOMINAL;  Surgeon: Janie Morning, MD;  Location:  [facility];  Service: Gynecology;  Laterality: N/A;  Total Abdominal Hysterectomy, Bilateral Salpingo Oophorectomy  . Salpingoophorectomy  09/12/2011    Procedure: SALPINGO OOPHERECTOMY;  Surgeon: Janie Morning, MD;  [facility];  Service: Gynecology;  Laterality: Bilateral;  . Colonoscopy  07/05/2012    Procedure:  COLONOSCOPY;  Surgeon: Rogene Houston, MD;  [facility];  Service: Endoscopy;  Laterality: N/A;  730  . Knee arthroplasty Left 03/15/2015    Procedure: COMPUTER ASSISTED TOTAL KNEE ARTHROPLASTY;  Surgeon: Marybelle Killings, MD;  Location: [place];  Service: Orthopedics;  Laterality: Left;   Social History:  reports that she has never smoked. She has never used smokeless tobacco. She reports that she does not drink alcohol or use illicit drugs. Lives at home with her husband she currently uses a walker for ambulation she is fairly independent with ADLs Allergies  Allergen Reactions  . Carbamazepine Hives and Other (See Comments)    headache  . Sulfa Antibiotics     Rash    Family History  Problem Relation Age of Onset  . Anesthesia problems Neg Hx   . Hypotension Neg Hx   . Malignant hyperthermia Neg Hx   . Pseudochol deficiency Neg Hx   . Other Mother   . Cancer Father   . Hypertension Son      Prior to Admission medications   Medication Sig Start Date End Date Taking? Authorizing Provider  albuterol (PROVENTIL HFA;VENTOLIN HFA) 108 (90 BASE) MCG/ACT inhaler Inhale 2 puffs into the lungs every 6 (six) hours as needed for wheezing or shortness of breath.   Yes Historical Provider, MD  aspirin EC 325 MG EC tablet Take 1 tablet (325 mg total) by mouth daily with breakfast. 03/18/15  Yes Lanae Crumbly, PA-C  atenolol (TENORMIN) 50 MG tablet Take 25 mg by mouth 2 (two) times daily.    Yes Historical Provider, MD  cloNIDine (CATAPRES) 0.3 MG tablet Take 0.15 mg by mouth 2 (two) times daily.    Yes Historical Provider, MD  diltiazem (CARDIZEM CD) 180 MG 24 hr capsule Take 180 mg by mouth at bedtime.  03/05/13  Yes Historical Provider, MD  docusate sodium (COLACE) 100 MG capsule Take 100 mg by mouth as needed.    Yes Historical Provider, MD  esomeprazole (NEXIUM) 40 MG capsule Take 1 capsule (40 mg total) by mouth every morning. Patient taking differently: Take 40 mg by mouth at bedtime.   09/15/14  Yes Butch Penny, NP  hydrochlorothiazide 25 MG tablet Take 25 mg by mouth every morning.    Yes Historical Provider, MD  HYDROcodone-acetaminophen (NORCO) 10-325 MG per tablet Take 1-2 tablets by mouth every 6 (six) hours as needed (breakthrough pain). 03/18/15  Yes Lanae Crumbly, PA-C  hydroxyurea (HYDREA) 500 MG capsule Take 2 capsules (1,000 mg total) by mouth daily. 01/05/15  Yes Manon Hilding Kefalas, PA-C  losartan (COZAAR) 100 MG tablet Take 100 mg by mouth daily.  02/25/13  Yes Historical Provider, MD  methocarbamol (ROBAXIN) 500 MG tablet Take 1 tablet (500 mg total) by mouth 4 (four) times daily. Patient taking differently: Take 500 mg by mouth 4 (four) times daily as needed for muscle spasms.  03/18/15  Yes Lanae Crumbly, PA-C  Simethicone (GAS RELIEF PO) Take 1 tablet by mouth as needed (gas).    Yes Historical Provider, MD  simvastatin (ZOCOR) 10 MG tablet Take 10 mg by mouth daily after supper.   Yes Historical Provider, MD  traZODone (DESYREL) 50 MG tablet Take 1 tablet by mouth at bedtime. 04/01/15  Yes Historical Provider, MD   Physical Exam: Filed Vitals:   04/02/15 1200 04/02/15 1230 04/02/15 1300 04/02/15 1330  BP: 107/36 109/66 105/49 116/47  Pulse: 66 65 65 67  Temp:      TempSrc:      Resp: 28 25 29 27   Height:      Weight:      SpO2: 95% 95% 89% 95%    Wt Readings from Last 3 Encounters:  04/02/15 127.007 kg (280 lb)  03/15/15 123.801 kg (272 lb 14.9 oz)  10/20/14 126.327 kg (278 lb 8 oz)    General:  Appears slightly anxious but not uncomfortable Eyes: PERRL, normal lids, irises, conjunctiva slightly pale ENT: grossly normal hearing, he does membranes of her mouth slightly dry slightly pale Neck: no LAD, masses or thyromegaly Cardiovascular: RRR, trace lower extremity edem Respiratory: Mild increased work of breathing with conversation breath sounds somewhat diminished but clear here no wheeze or crackles. Abdomen: Obese soft, nd, mild diffuse tenderness  particularly in the lower quadrants to palpation sluggish bowel sounds no guarding or rebounding Skin: no rash or induration seen on limited exam incision lateral aspect left knee clean and dry Musculoskeletal: grossly normal tone BUE/BLE Psychiatric: grossly normal mood and affect, speech fluent and appropriate Neurologic: grossly non-focal. Speech clear facial symmetry           Labs on Admission:  Basic Metabolic Panel:  Recent Labs Lab 03/30/15 0740 04/02/15 1106  NA 136 136  K 3.8 3.3*  CL 97* 99*  CO2 33* 28  GLUCOSE 117* 119*  BUN 12 14  CREATININE 0.65 0.64  CALCIUM 9.2 8.7*   Liver Function Tests:  Recent Labs Lab 03/30/15 0740  AST 21  ALT 14  ALKPHOS 94  BILITOT 0.9  PROT 7.6  ALBUMIN 3.5   No results for input(s): LIPASE, AMYLASE in the last 168 hours. No results for input(s): AMMONIA in the last 168 hours. CBC:  Recent Labs Lab 03/30/15 0740 04/02/15 1106  WBC 5.8 8.6  NEUTROABS 4.1 7.0  HGB 10.2* 9.5*  HCT 32.0* 29.1*  MCV 113.5* 112.8*  PLT 363 301   Cardiac Enzymes: No results for input(s): CKTOTAL, CKMB, CKMBINDEX, TROPONINI in the last 168 hours.  BNP (last 3 results) No results for input(s): BNP in the last 8760 hours.  ProBNP (last 3 results) No results for input(s): PROBNP in the last 8760 hours.  CBG: No results for input(s): GLUCAP in the last 168 hours.  Radiological Exams on Admission: No results found.  EKG: Independently reviewed. This rhythm with right bundle branch block  Assessment/Plan Principal Problem:   Hematochezia: Patient reports taking Celebrex routinely until about 3 weeks ago. Status post left knee replacement 3 weeks ago. Colonoscopy 2013 revealing few diverticula out evidence of polyps. Will admit to telemetry. Will provide clear liquids and request GI consult. Analgesia can antimanic as needed. Protonix IV.  Active Problems: Anemia: Likely acute blood loss secondary to #1. Anemia panel pending. Will  hold aspirin. Will obtain serial CBC. Will transfuse when indicated. Currently hemoglobin 9.5 she's had no further hematochezia since 8 AM this morning. Chart review indicates hemoglobin 14 3 weeks ago. She denies needing transfusion during her hospitalization for knee replacement  Hypertension: Pressure somewhat soft flow in the emergency department. Her home medications include atenolol, clonidine, diltiazem, HCTZ, losartan. Will hold all of these for now given soft blood pressure. Will provide  gentle IV fluids. Will resume as indicated.  Dysrythmia: Patient on Cardizem. Denies ever having seen a cardiologist. EKG with sinus rhythm and right bundle branch block. Heart rate range in the emergency department is 65-71. Hold Cardizem for now. Monitor   Bronchitis, chronic obstructive: Home medications include albuterol inhaler and we will continue this. She appears stable at baseline. She denies worsening shortness of breath or respiratory rate slightly elevated from anxiety. She denies any pain. Vision saturation level did drop to 89% while in emergency department. Will continue oxygen supplementation and monitor. If no improvement will obtain a chest x-ray    Status post total left knee replacement: Denies pain. Will continue her home Robaxin and Norco as needed.    GERD (gastroesophageal reflux disease): Stable at baseline    Hiatal hernia: Appears stable at baseline    Myeloproliferative disorder, JAK-2 positive: Chart review indicates followed by hematology at any [facility]. Recent office visit with note indicating no change in therapy.     Obesity, morbid (more than 100 lbs over ideal weight or BMI > 40) EMI 48.2. Nutritional consult.    Code Status: full DVT Prophylaxis: Family Communication: husband at bedside Disposition Plan: home when ready  Time spent: [facility] Pager [phone]  \

## 2015-04-02 NOTE — Progress Notes (Signed)
Initial Nutrition Assessment  DOCUMENTATION CODES: Morbid obesity  INTERVENTION: -Resource Breeze po TID, each supplement provides 250 kcal and 9 grams of protein  -Changed Pts Dinner to acceptable requests  NUTRITION DIAGNOSIS: Inadequate oral intake related to loss of appetite as evidenced by reportedly eating <75% regular intake the past few days.   GOAL: Patient will meet greater than or equal to 90% of their needs  MONITOR: PO intake, Supplement acceptance, Diet advancement, Labs, work up  [place]: Malnutrition Screening Tool  ASSESSMENT: 74 y.o. female PMHx of polycythemia vera, myeloproliferative disorder, HTN, GERD, hiatal hernia, dysrhythmia, endometrial cancer status post hysterectomy, bronchitis, s/p knee replacement 2 weeks ago presents to the emergency department with chief complaint of bright red blood per rectum.  I followed Pt at the [facility] where she was discharged from on 5/24. Pt reports being fine until 4:00 yesterday at which time she started passing severe amounts of blood from her rectum. She reports her oral intake  has also been pooras she has had no appetite for the past couple days.  Unsure of true weight. She was discharged from [facility] at 281.  NFPE: WDL  Labs reviewed: hypokalemic, hypochloremic, Anemia  Height: Ht Readings from Last 1 Encounters:  04/02/15 5\' 4"  (1.626 m)   Weight: Wt Readings from Last 1 Encounters:  04/02/15 280 lb (127.007 kg)    Ideal Body Weight:  54.5 kg  Wt Readings from Last 10 Encounters:  04/02/15 280 lb (127.007 kg)  03/15/15 272 lb 14.9 oz (123.801 kg)  10/20/14 278 lb 8 oz (126.327 kg)  09/30/14 277 lb 3.2 oz (125.737 kg)  09/15/14 277 lb 11.2 oz (125.964 kg)  04/01/14 277 lb 6.4 oz (125.828 kg)  03/03/14 272 lb 8 oz (123.605 kg)  01/20/14 273 lb 12.8 oz (124.195 kg)  01/05/14 271 lb 12.8 oz (123.288 kg)  12/19/13 269 lb 11.2 oz (122.335 kg)    BMI:  Body mass index is 48.04  kg/(m^2).  Estimated Nutritional Needs: Kcal:  1400-1550 (11-14 kcal/kg) Protein:  65-76 (1.2-1.4 g/kg IBW) Fluid:  1.4-1.6 liters  Skin:  Dry, Ecchymosis, surgical incision  Diet Order:  Diet clear liquid Room service appropriate?: Yes; Fluid consistency:: Thin  EDUCATION NEEDS: No education needs identified at this time   Intake/Output Summary (Last 24 hours) at 04/02/15 1734 Last data filed at 04/02/15 1712  Gross per 24 hour  Intake    120 ml  Output      0 ml  Net    120 ml    Last BM:  5/27  Burtis Junes RD, LDN Nutrition Pager: [phone] 04/02/2015 5:34 PM

## 2015-04-02 NOTE — ED Provider Notes (Signed)
CSN: [id number]     Arrival date & time 04/02/15  2202 History  This chart was scribed for Lori Manifold, MD by Stephania Fragmin, ED Scribe. This patient was seen in room APA06/APA06 and the patient's care was started at 11:09 AM.   Chief Complaint  Patient presents with  . Rectal Bleeding   Patient is a 74 y.o. female presenting with hematochezia. The history is provided by the patient. No language interpreter was used.  Rectal Bleeding Quality:  Bright red Amount:  Copious Duration:  1 day Timing:  Constant Progression:  Worsening Chronicity:  New Context: spontaneously   Similar prior episodes: no   Relieved by:  Nothing Worsened by:  Nothing tried Ineffective treatments:  None tried Associated symptoms: abdominal pain   Associated symptoms: no dizziness and no light-headedness      HPI Comments: [name] is a 74 y.o. female S/P knee arthroplasty 2 weeks ago who presents to the Emergency Department complaining of constant, all liquid/no stool, bright red rectal bleeding that began yesterday afternoon. She complains of associated cramping, generalized abdominal pain, and weakness. Her PCP told her to come to the ED. Patient states she had a knee replacement 2 weeks ago, and around that time had concerns of having a low hemoglobin below 8.5. She denies any blood thinner use after her surgery. She reports taking a regular 325 aspirin, and took one this morning. She has had a routine colonoscopy. She denies dizziness, lightheadedness, or SOB. PCP Dr. [name]  Past Medical History  Diagnosis Date  . Arthritis   . Hypertension   . GERD (gastroesophageal reflux disease)   . Obesity, morbid (more than 100 lbs over ideal weight or BMI > 40) 08/2011    Ht 5'7", wt 280 lb  . Bronchitis, chronic obstructive since 2000's  . Shortness of breath     unable to lay flat due to dyspnea  . Hiatal hernia   . Hiatal hernia 11/19/2012  . Cancer     dx with endometrial cancer   . Heart murmur    . Myeloproliferative disorder, JAK-2 positive 01/05/2014  . Polycythemia vera(238.4) 01/18/2014  . Gout   . Pneumonia 09/2014  . Dysrhythmia   . PONV (postoperative nausea and vomiting)     post-op hypoxic resp failure 09/2011 and require Bi-Pap (possible r/t - pressure pulm edema vs asp PNA, COPD exacerbation, obesity hypoventilation   Past Surgical History  Procedure Laterality Date  . Hysteroscopy w/d&c  08/15/2011    Procedure: DILATATION AND CURETTAGE (D&C) /HYSTEROSCOPY;  Surgeon: Jonnie Kind, MD;  Location: [facility];  Service: Gynecology;  Laterality: N/A;  With Suction Curette  . Abdominal hysterectomy  09/12/2011    Procedure: HYSTERECTOMY ABDOMINAL;  Surgeon: Janie Morning, MD;  Location: [facility];  Service: Gynecology;  Laterality: N/A;  Total Abdominal Hysterectomy, Bilateral Salpingo Oophorectomy  . Salpingoophorectomy  09/12/2011    Procedure: SALPINGO OOPHERECTOMY;  Surgeon: Janie Morning, MD;  Location: [facility];  Service: Gynecology;  Laterality: Bilateral;  . Colonoscopy  07/05/2012    Procedure: COLONOSCOPY;  Surgeon: Rogene Houston, MD;  [facility]: [facility];  Service: Endoscopy;  Laterality: N/A;  730  . Knee arthroplasty Left 03/15/2015    Procedure: COMPUTER ASSISTED TOTAL KNEE ARTHROPLASTY;  Surgeon: Marybelle Killings, MD;  Location: [place];  Service: Orthopedics;  Laterality: Left;   Family History  Problem Relation Age of Onset  . Anesthesia problems Neg Hx   . Hypotension Neg Hx   .  Malignant hyperthermia Neg Hx   . Pseudochol deficiency Neg Hx   . Other Mother   . Cancer Father   . Hypertension Son    History  Substance Use Topics  . Smoking status: Never Smoker   . Smokeless tobacco: Never Used  . Alcohol Use: No   OB History    Gravida Para Term Preterm AB TAB SAB Ectopic Multiple Living   5 5   0     5     Review of Systems  Respiratory: Negative for shortness of breath.   Gastrointestinal: Positive for abdominal pain and hematochezia.   Neurological: Negative for dizziness and light-headedness.      Allergies  Carbamazepine and Sulfa antibiotics  Home Medications   Prior to Admission medications   Medication Sig Start Date End Date Taking? Authorizing Provider  albuterol (PROVENTIL HFA;VENTOLIN HFA) 108 (90 BASE) MCG/ACT inhaler Inhale 2 puffs into the lungs every 6 (six) hours as needed for wheezing or shortness of breath.   Yes Historical Provider, MD  aspirin EC 325 MG EC tablet Take 1 tablet (325 mg total) by mouth daily with breakfast. 03/18/15  Yes Lanae Crumbly, PA-C  atenolol (TENORMIN) 50 MG tablet Take 25 mg by mouth 2 (two) times daily.    Yes Historical Provider, MD  cloNIDine (CATAPRES) 0.3 MG tablet Take 0.15 mg by mouth 2 (two) times daily.    Yes Historical Provider, MD  diltiazem (CARDIZEM CD) 180 MG 24 hr capsule Take 180 mg by mouth at bedtime.  03/05/13  Yes Historical Provider, MD  docusate sodium (COLACE) 100 MG capsule Take 100 mg by mouth as needed.    Yes Historical Provider, MD  esomeprazole (NEXIUM) 40 MG capsule Take 1 capsule (40 mg total) by mouth every morning. Patient taking differently: Take 40 mg by mouth at bedtime.  09/15/14  Yes Butch Penny, NP  hydrochlorothiazide 25 MG tablet Take 25 mg by mouth every morning.    Yes Historical Provider, MD  HYDROcodone-acetaminophen (NORCO) 10-325 MG per tablet Take 1-2 tablets by mouth every 6 (six) hours as needed (breakthrough pain). 03/18/15  Yes Lanae Crumbly, PA-C  hydroxyurea (HYDREA) 500 MG capsule Take 2 capsules (1,000 mg total) by mouth daily. 01/05/15  Yes Manon Hilding Kefalas, PA-C  losartan (COZAAR) 100 MG tablet Take 100 mg by mouth daily.  02/25/13  Yes Historical Provider, MD  methocarbamol (ROBAXIN) 500 MG tablet Take 1 tablet (500 mg total) by mouth 4 (four) times daily. Patient taking differently: Take 500 mg by mouth 4 (four) times daily as needed for muscle spasms.  03/18/15  Yes Lanae Crumbly, PA-C  Simethicone (GAS RELIEF PO) Take 1  tablet by mouth as needed (gas).    Yes Historical Provider, MD  simvastatin (ZOCOR) 10 MG tablet Take 10 mg by mouth daily after supper.   Yes Historical Provider, MD  traZODone (DESYREL) 50 MG tablet Take 1 tablet by mouth at bedtime. 04/01/15  Yes Historical Provider, MD   BP 132/58 mmHg  Pulse 71  Temp(Src) 98.5 F (36.9 C) (Oral)  Resp 24  Ht 5\' 4"  (1.626 m)  Wt 280 lb (127.007 kg)  BMI 48.04 kg/m2  SpO2 96% Physical Exam  Constitutional: She appears well-developed and well-nourished. No distress.  HENT:  Head: Normocephalic and atraumatic.  Eyes: Conjunctivae are normal. Right eye exhibits no discharge. Left eye exhibits no discharge.  Conjunctiva are pale.  Neck: Neck supple.  Cardiovascular: Normal rate, regular rhythm and normal  heart sounds.  Exam reveals no gallop and no friction rub.   No murmur heard. Pulmonary/Chest: Effort normal and breath sounds normal. No respiratory distress.  Abdominal: Soft. She exhibits no distension. There is tenderness. There is no rebound and no guarding.  Mild left abdominal TTP, no rebound or guarding.   Musculoskeletal: She exhibits no edema or tenderness.  Neurological: She is alert.  Skin: Skin is warm and dry.  Psychiatric: She has a normal mood and affect. Her behavior is normal. Thought content normal.  Nursing note and vitals reviewed.   ED Course  Procedures (including critical care time)  DIAGNOSTIC STUDIES: Oxygen Saturation is 96% on RA, normal by my interpretation.    COORDINATION OF CARE: 11:10 AM - Discussed treatment plan with pt at bedside which includes monitoring patient at hospital, and blood transfusion, and pt agreed to plan.   Labs Review Labs Reviewed  CBC WITH DIFFERENTIAL/PLATELET - Abnormal; Notable for the following:    RBC 2.58 (*)    Hemoglobin 9.5 (*)    HCT 29.1 (*)    MCV 112.8 (*)    MCH 36.8 (*)    Neutrophils Relative % 82 (*)    Lymphocytes Relative 10 (*)    All other components within  normal limits  BASIC METABOLIC PANEL - Abnormal; Notable for the following:    Potassium 3.3 (*)    Chloride 99 (*)    Glucose, Bld 119 (*)    Calcium 8.7 (*)    All other components within normal limits  TYPE AND SCREEN   Imaging Review No results found.   EKG Interpretation None      MDM   Final diagnoses:  Rectal bleeding   74yF with rectal bleeding. Hemoglobin 9.5 from 14 about a month ago. Type and screen. Admit for further evaluation.   I personally preformed the services scribed in my presence. The recorded information has been reviewed is accurate. Lori Manifold, MD.     Lori Manifold, MD 04/15/15 [phone]

## 2015-04-02 NOTE — Consult Note (Signed)
Referring Provider: No ref. provider found Primary Care Physician:  [name], MD Primary Gastroenterologist:  Dr. Laural Golden  Reason for Consultation:    Rectal bleeding.  HPI:   Patient is 74 year old Caucasian female with multiple medical problems who underwent computer assisted total left knee arthroplasty on 03/15/2015 and had uneventful course. She was at pain Center for rehabilitation until 3 days ago. She was doing fine until about 2 PM yesterday when she had urge to have a bowel movement. She passed small amount of formed stool and a few minutes later she had cramping and had to explosive bowel movements. Stools were loose and watery. When she went back for the third time she passed bright red blood. Since then she has been passing bright red blood and every time she has to have a bowel movement she has severe cramping which is relieved with defecation. While at pain Center she was in a private room. There is no history of recent anti-biotic use other than for prophylactic purposes prior to knee surgery. He denies fever but had chills yesterday. No other member of for family has similar symptoms. She says heartburns well controlled with Nexium. She denies dysphagia. She is getting physical therapy while [facility] she is also doing exercises regularly at home. She is known fold does aspirin for prophylaxis. Patient had screening colonoscopy by me in August 2013. He had very redundant colon with few diverticula at sigmoid and descending colon. Area behind the ileocecal valve was not seen. Therefore she was brought back for barium enema revealing long tortuous colon but cecum was normal. In January 2014 she had upper GI series revealing arch hiatal hernia with GE reflux and esophageal motility disorder. Patient lives at home with her husband. They have 5 children. History is negative for CRC.    Past Medical History  Diagnosis Date  . Arthritis   . Hypertension   . GERD  (gastroesophageal reflux disease)   . Obesity, morbid (more than 100 lbs over ideal weight or BMI > 40) 08/2011    Ht 5'7", wt 280 lb  . Bronchitis, chronic obstructive since 2000's  . Shortness of breath     unable to lay flat due to dyspnea  . Hiatal hernia   . Hiatal hernia 11/19/2012  . Cancer     dx with endometrial cancer   . Heart murmur   . Myeloproliferative disorder, JAK-2 positive 01/05/2014  . Polycythemia vera(238.4) 01/18/2014  . Gout   . Pneumonia 09/2014  . Dysrhythmia   . PONV (postoperative nausea and vomiting)     post-op hypoxic resp failure 09/2011 and require Bi-Pap (possible r/t - pressure pulm edema vs asp PNA, COPD exacerbation, obesity hypoventilation  . Anemia 04/02/2015  . Obesity     Past Surgical History  Procedure Laterality Date  . Hysteroscopy w/d&c  08/15/2011    Procedure: DILATATION AND CURETTAGE (D&C) /HYSTEROSCOPY;  Surgeon: Jonnie Kind, MD;  Location: [facility];  Service: Gynecology;  Laterality: N/A;  With Suction Curette  . Abdominal hysterectomy  09/12/2011    Procedure: HYSTERECTOMY ABDOMINAL;  Surgeon: Janie Morning, MD;  Location: [facility];  Service: Gynecology;  Laterality: N/A;  Total Abdominal Hysterectomy, Bilateral Salpingo Oophorectomy  . Salpingoophorectomy  09/12/2011    Procedure: SALPINGO OOPHERECTOMY;  Surgeon: Janie Morning, MD;  Location: [facility];  Service: Gynecology;  Laterality: Bilateral;  . Colonoscopy  07/05/2012    Procedure: COLONOSCOPY;  Surgeon: Rogene Houston, MD;  [facility];  Service:  Endoscopy;  Laterality: N/A;  730  . Knee arthroplasty Left 03/15/2015    Procedure: COMPUTER ASSISTED TOTAL KNEE ARTHROPLASTY;  Surgeon: Marybelle Killings, MD;  Location: [place] [facility];  Service: Orthopedics;  Laterality: Left;    Prior to Admission medications   Medication Sig Start Date End Date Taking? Authorizing Provider  albuterol (PROVENTIL HFA;VENTOLIN HFA) 108 (90 BASE) MCG/ACT inhaler Inhale 2 puffs into the lungs every 6 (six)  hours as needed for wheezing or shortness of breath.   Yes Historical Provider, MD  aspirin EC 325 MG EC tablet Take 1 tablet (325 mg total) by mouth daily with breakfast. 03/18/15  Yes Lanae Crumbly, PA-C  atenolol (TENORMIN) 50 MG tablet Take 25 mg by mouth 2 (two) times daily.    Yes Historical Provider, MD  cloNIDine (CATAPRES) 0.3 MG tablet Take 0.15 mg by mouth 2 (two) times daily.    Yes Historical Provider, MD  diltiazem (CARDIZEM CD) 180 MG 24 hr capsule Take 180 mg by mouth at bedtime.  03/05/13  Yes Historical Provider, MD  docusate sodium (COLACE) 100 MG capsule Take 100 mg by mouth as needed.    Yes Historical Provider, MD  esomeprazole (NEXIUM) 40 MG capsule Take 1 capsule (40 mg total) by mouth every morning. Patient taking differently: Take 40 mg by mouth at bedtime.  09/15/14  Yes Butch Penny, NP  hydrochlorothiazide 25 MG tablet Take 25 mg by mouth every morning.    Yes Historical Provider, MD  HYDROcodone-acetaminophen (NORCO) 10-325 MG per tablet Take 1-2 tablets by mouth every 6 (six) hours as needed (breakthrough pain). 03/18/15  Yes Lanae Crumbly, PA-C  hydroxyurea (HYDREA) 500 MG capsule Take 2 capsules (1,000 mg total) by mouth daily. 01/05/15  Yes Manon Hilding Kefalas, PA-C  losartan (COZAAR) 100 MG tablet Take 100 mg by mouth daily.  02/25/13  Yes Historical Provider, MD  methocarbamol (ROBAXIN) 500 MG tablet Take 1 tablet (500 mg total) by mouth 4 (four) times daily. Patient taking differently: Take 500 mg by mouth 4 (four) times daily as needed for muscle spasms.  03/18/15  Yes Lanae Crumbly, PA-C  Simethicone (GAS RELIEF PO) Take 1 tablet by mouth as needed (gas).    Yes Historical Provider, MD  simvastatin (ZOCOR) 10 MG tablet Take 10 mg by mouth daily after supper.   Yes Historical Provider, MD  traZODone (DESYREL) 50 MG tablet Take 1 tablet by mouth at bedtime. 04/01/15  Yes Historical Provider, MD    Current Facility-Administered Medications  Medication Dose Route  Frequency Provider Last Rate Last Dose  . 0.9 % NaCl with KCl 20 mEq/ L  infusion   Intravenous Continuous Rexene Alberts, MD 75 mL/hr at 04/02/15 1924    . acetaminophen (TYLENOL) tablet 650 mg  650 mg Oral Q6H PRN Radene Gunning, NP       Or  . acetaminophen (TYLENOL) suppository 650 mg  650 mg Rectal Q6H PRN Lezlie Octave Black, NP      . albuterol (PROVENTIL) (2.5 MG/3ML) 0.083% nebulizer solution 2.5 mg  2.5 mg Nebulization Q6H PRN [name], MD      . cloNIDine (CATAPRES) tablet 0.2 mg  0.2 mg Oral BID Rexene Alberts, MD      . feeding supplement (RESOURCE BREEZE) (RESOURCE BREEZE) liquid 1 Container  1 Container Oral TID BM [name], MD      . HYDROcodone-acetaminophen [facility]) 10-325 MG per tablet 1-2 tablet  1-2 tablet Oral Q6H PRN Radene Gunning,  NP      . Derrill Memo ON 04/03/2015] hydroxyurea (HYDREA) capsule 1,000 mg  1,000 mg Oral Daily Lezlie Octave Black, NP      . losartan (COZAAR) tablet 25 mg  25 mg Oral Daily Rexene Alberts, MD   25 mg at 04/02/15 1924  . morphine 2 MG/ML injection 1 mg  1 mg Intravenous Q3H PRN Radene Gunning, NP      . ondansetron [facility]) tablet 4 mg  4 mg Oral Q6H PRN Radene Gunning, NP       Or  . ondansetron [facility]) injection 4 mg  4 mg Intravenous Q6H PRN Radene Gunning, NP      . Derrill Memo ON 04/03/2015] pantoprazole (PROTONIX) injection 40 mg  40 mg Intravenous Q24H Lezlie Octave Black, NP      . simethicone [facility]) chewable tablet 80 mg  80 mg Oral QID PRN Radene Gunning, NP      . simvastatin (ZOCOR) tablet 10 mg  10 mg Oral QPC supper Radene Gunning, NP   10 mg at 04/02/15 1924  . sodium chloride 0.9 % injection 3 mL  3 mL Intravenous Q12H Lezlie Octave Black, NP      . traZODone (DESYREL) tablet 50 mg  50 mg Oral QHS Radene Gunning, NP        Allergies as of 04/02/2015 - Review Complete 04/02/2015  Allergen Reaction Noted  . Carbamazepine Hives and Other (See Comments)   . Sulfa antibiotics  09/15/2014    Family History  Problem Relation Age of Onset  . Anesthesia problems Neg  Hx   . Hypotension Neg Hx   . Malignant hyperthermia Neg Hx   . Pseudochol deficiency Neg Hx   . Other Mother   . Cancer Father   . Hypertension Son     History   Social History  . Marital Status: Married    Spouse Name: N/A  . Number of Children: N/A  . Years of Education: N/A   Occupational History  . Not on file.   Social History Main Topics  . Smoking status: Never Smoker   . Smokeless tobacco: Never Used  . Alcohol Use: No  . Drug Use: No  . Sexual Activity: No     Comment: hysterectomy   Other Topics Concern  . Not on file   Social History Narrative    Review of Systems: See HPI, otherwise normal ROS  Physical Exam: Temp:  [98.5 F (36.9 C)-98.9 F (37.2 C)] 98.9 F (37.2 C) (05/27 1625) Pulse Rate:  [65-71] 69 (05/27 1625) Resp:  [24-33] 26 (05/27 1625) BP: (103-141)/(35-66) 141/46 mmHg (05/27 1625) SpO2:  [89 %-96 %] 93 % (05/27 1625) Weight:  [280 lb (127.007 kg)] 280 lb (127.007 kg) (05/27 1002) Last BM Date: 04/02/15 Pleasant well-developed obese Caucasian female who is in no acute distress. Conjunctiva somewhat pale. Sclerae nonicteric. Oropharyngeal mucosa is normal. No neck masses or thyromegaly noted. Cardiac exam with regular rhythm normal S1 and S2. Grade 2/6 systolic ejection murmur noted at left sternal border and aortic area. Lungs are clear to auscultation. Abdomen is full. Bowel sounds are hyperactive. On palpation abdomen is soft with mild tenderness at LLQ and mild to moderate tenderness at LUQ without guarding or rebound. No organomegaly or masses. Edema noted to left foot.        Lab Results:  Recent Labs  04/02/15 1106  WBC 8.6  HGB 9.5*  HCT 29.1*  PLT 301   BMET  Recent Labs  04/02/15 1106  NA 136  K 3.3*  CL 99*  CO2 28  GLUCOSE 119*  BUN 14  CREATININE 0.64  CALCIUM 8.7*     Recent Labs  04/02/15 1106  LABPROT 14.9  INR 1.15    Assessment;  Patient is 74 year old Caucasian female who  presents with acute onset of rectal bleeding and cramping. Symptoms started yesterday with nonbloody bowel movements followed by rectal bleeding. Patient's symptom complex suggestive of acute colitis most likely ischemic in nature but infection remains in differential diagnosis. Patient's anemia appears to be multifactorial.  Hemoglobin about 2 weeks ago was 8.9 g following left knee replacement. Patient has chronic GERD with known large sliding hiatal hernia. She appears to be doing well with therapy. Patient is status post left knee arthroplasty and at risk for DVT. Patient is aware and doing exercises frequently.  Recommendations;  Monitor H&H as you're doing. No studies to consist of C. difficile by PCR cultures and O&P. Diagnostic flexible sigmoidoscopy in a.m.   LOS: 0 days   Siya Flurry U  04/02/2015, 7:25 PM

## 2015-04-03 ENCOUNTER — Encounter (HOSPITAL_COMMUNITY)
Admission: EM | Disposition: A | Discharge status: Home / Self Care | Payer: Self-pay | Source: Home / Self Care | Attending: Internal Medicine

## 2015-04-03 ENCOUNTER — Encounter (HOSPITAL_COMMUNITY): Payer: Self-pay | Admitting: *Deleted

## 2015-04-03 DIAGNOSIS — K529 Noninfective gastroenteritis and colitis, unspecified: Secondary | ICD-10-CM

## 2015-04-03 HISTORY — PX: FLEXIBLE SIGMOIDOSCOPY: SHX5431

## 2015-04-03 LAB — CBC
HCT: 28.5 % — ABNORMAL LOW (ref 36.0–46.0)
HEMATOCRIT: 28.5 % — AB (ref 36.0–46.0)
Hemoglobin: 9.1 g/dL — ABNORMAL LOW (ref 12.0–15.0)
Hemoglobin: 9.4 g/dL — ABNORMAL LOW (ref 12.0–15.0)
MCH: 36.3 pg — ABNORMAL HIGH (ref 26.0–34.0)
MCH: 37.2 pg — ABNORMAL HIGH (ref 26.0–34.0)
MCHC: 31.9 g/dL (ref 30.0–36.0)
MCHC: 33 g/dL (ref 30.0–36.0)
MCV: 113.5 fL — ABNORMAL HIGH (ref 78.0–100.0)
MCV: 112.6 fL — AB (ref 78.0–100.0)
PLATELETS: 264 10*3/uL (ref 150–400)
Platelets: 260 10*3/uL (ref 150–400)
RBC: 2.51 MIL/uL — ABNORMAL LOW (ref 3.87–5.11)
RBC: 2.53 MIL/uL — AB (ref 3.87–5.11)
RDW: 14 % (ref 11.5–15.5)
RDW: 13.8 % (ref 11.5–15.5)
WBC: 7.3 10*3/uL (ref 4.0–10.5)
WBC: 7.6 10*3/uL (ref 4.0–10.5)

## 2015-04-03 LAB — COMPREHENSIVE METABOLIC PANEL
ALT: 12 U/L — AB (ref 14–54)
AST: 13 U/L — AB (ref 15–41)
Albumin: 3 g/dL — ABNORMAL LOW (ref 3.5–5.0)
Alkaline Phosphatase: 82 U/L (ref 38–126)
Anion gap: 7 (ref 5–15)
BUN: 10 mg/dL (ref 6–20)
CHLORIDE: 103 mmol/L (ref 101–111)
CO2: 29 mmol/L (ref 22–32)
CREATININE: 0.61 mg/dL (ref 0.44–1.00)
Calcium: 8.7 mg/dL — ABNORMAL LOW (ref 8.9–10.3)
GFR calc Af Amer: >60 mL/min (ref >60)
GFR calc non Af Amer: >60 mL/min (ref >60)
Glucose, Bld: 106 mg/dL — ABNORMAL HIGH (ref 65–99)
Potassium: 3.7 mmol/L (ref 3.5–5.1)
SODIUM: 139 mmol/L (ref 135–145)
Total Bilirubin: 1 mg/dL (ref 0.3–1.2)
Total Protein: 6.3 g/dL — ABNORMAL LOW (ref 6.5–8.1)

## 2015-04-03 LAB — SAMPLE TO BLOOD BANK

## 2015-04-03 LAB — CLOSTRIDIUM DIFFICILE BY PCR: CDIFFPCR: NEGATIVE

## 2015-04-03 SURGERY — SIGMOIDOSCOPY, FLEXIBLE
Anesthesia: Moderate Sedation

## 2015-04-03 MED ORDER — CIPROFLOXACIN HCL 250 MG PO TABS
500.0000 mg | ORAL_TABLET | Freq: Two times a day (BID) | ORAL | Status: DC
  Administered 2015-04-03 – 2015-04-04 (×3): 500 mg via ORAL
  Filled 2015-04-03: qty 1
  Filled 2015-04-03 (×2): qty 2
  Filled 2015-04-03 (×4): qty 1
  Filled 2015-04-03: qty 2

## 2015-04-03 MED ORDER — SODIUM CHLORIDE 0.9 % IV SOLN
INTRAVENOUS | Status: DC
  Administered 2015-04-03: 10:00:00 via INTRAVENOUS

## 2015-04-03 MED ORDER — MEPERIDINE HCL 25 MG/ML IJ SOLN
INTRAMUSCULAR | Status: DC | PRN
  Administered 2015-04-03 (×2): 25 mg via INTRAVENOUS

## 2015-04-03 MED ORDER — FLEET ENEMA 7-19 GM/118ML RE ENEM
ENEMA | RECTAL | Status: DC | PRN
  Administered 2015-04-03: 1 via RECTAL

## 2015-04-03 MED ORDER — METRONIDAZOLE 500 MG PO TABS
250.0000 mg | ORAL_TABLET | Freq: Three times a day (TID) | ORAL | Status: DC
  Administered 2015-04-03 – 2015-04-04 (×3): 250 mg via ORAL
  Filled 2015-04-03 (×3): qty 1

## 2015-04-03 MED ORDER — MIDAZOLAM HCL 5 MG/5ML IJ SOLN
INTRAMUSCULAR | Status: DC | PRN
  Administered 2015-04-03: 2 mg via INTRAVENOUS
  Administered 2015-04-03: 1 mg via INTRAVENOUS
  Administered 2015-04-03: 2 mg via INTRAVENOUS
  Administered 2015-04-03: 1 mg via INTRAVENOUS

## 2015-04-03 MED ORDER — MEPERIDINE HCL 50 MG/ML IJ SOLN
INTRAMUSCULAR | Status: AC
  Filled 2015-04-03: qty 1

## 2015-04-03 MED ORDER — FLEET ENEMA 7-19 GM/118ML RE ENEM: 1.0000 | ENEMA | Freq: Once | RECTAL | Status: DC

## 2015-04-03 MED ORDER — STERILE WATER FOR IRRIGATION IR SOLN
Status: DC | PRN
  Administered 2015-04-03: 10:00:00

## 2015-04-03 MED ORDER — MIDAZOLAM HCL 5 MG/5ML IJ SOLN
INTRAMUSCULAR | Status: AC
  Filled 2015-04-03: qty 10

## 2015-04-03 NOTE — Progress Notes (Signed)
  Subjective:  Patient feels better. She is having cramps less frequently. She required pain medication once during the night. She has been passing flatus but has not passed blood per rectum since last evening. She is hungry. She continues to complain of pain in left upper quadrant of her abdomen.  Objective: Blood pressure 117/52, pulse 65, temperature 97.9 F (36.6 C), temperature source Oral, resp. rate 20, height 5\' 4"  (1.626 m), weight 280 lb (127.007 kg), SpO2 90 %. Patient is alert and in no acute distress. Abdomen is full. Bowel sounds are normal. On palpation abdomen is soft with mild to moderate tenderness at LUQ without guarding or rebound.    Labs/studies Results:   Recent Labs  04/02/15 1106 04/02/15 1946 04/03/15 0628  WBC 8.6 9.1 7.3  HGB 9.5* 9.0* 9.1*  HCT 29.1* 28.0* 28.5*  PLT 301 264 260    BMET   Recent Labs  04/02/15 1106 04/03/15 0628  NA 136 139  K 3.3* 3.7  CL 99* 103  CO2 28 29  GLUCOSE 119* 106*  BUN 14 10  CREATININE 0.64 0.61  CALCIUM 8.7* 8.7*    LFT   Recent Labs  04/03/15 0628  PROT 6.3*  ALBUMIN 3.0*  AST 13*  ALT 12*  ALKPHOS 82  BILITOT 1.0    B12 level is 464 Folate level 16.7 Serum iron 18, TIBC 237 and saturation 8% Serum ferritin 149.    Assessment:  Rectal bleeding secondary to acute colitis most likely ischemic in origin. Rectal bleeding has stopped.  H&H is low but stable. Iron studies suggest anemia of chronic disease.  Recommendations;  Diagnostic flexible sigmoidoscopy.

## 2015-04-03 NOTE — Progress Notes (Signed)
  [name] O[facility]:[id number] DOB: 01/30/1941 DOA: 04/02/2015 PCP: [name], MD   Subjective: This lady overall feels better. She has had no further rectal bleeding since infected admission. She denies any abdominal pain, fever, nausea, vomiting. She is not feeling drowsy or lightheaded. Hemodynamically she has been stable. She was seen by gastroneurology today and she is due to have flexible sigmoidoscopy today.           Physical Exam: Blood pressure 117/52, pulse 65, temperature 97.9 F (36.6 C), temperature source Oral, resp. rate 20, height 5\' 4"  (1.626 m), weight 127.007 kg (280 lb), SpO2 90 %. She looks systemic well. She is morbidly obese. Heart sounds are present with soft systolic murmur that does not radiate into the carotids. Jugular venous pressure is not raised. Lung fields are entirely clear. She is alert and orientated without any focal neurological signs.   Investigations:  No results found for this or any previous visit (from the past 240 hour(s)).   Basic Metabolic Panel:  Recent Labs  04/02/15 1106 04/03/15 0628  NA 136 139  K 3.3* 3.7  CL 99* 103  CO2 28 29  GLUCOSE 119* 106*  BUN 14 10  CREATININE 0.64 0.61  CALCIUM 8.7* 8.7*  MG 1.7  --    Liver Function Tests:  Recent Labs  04/03/15 0628  AST 13*  ALT 12*  ALKPHOS 82  BILITOT 1.0  PROT 6.3*  ALBUMIN 3.0*     CBC:  Recent Labs  04/02/15 1106 04/02/15 1946 04/03/15 0628  WBC 8.6 9.1 7.3  NEUTROABS 7.0  --   --   HGB 9.5* 9.0* 9.1*  HCT 29.1* 28.0* 28.5*  MCV 112.8* 112.9* 113.5*  PLT 301 264 260    No results found.    Medications: I have reviewed the patient's current medications.  Impression: 1. Rectal bleeding. The working diagnosis appears to be ischemic colitis. She will have flexible sigmoidoscopy today. Hemoglobin is stable and she is hemodynamically stable. There is no blood transfusion parameter the present time. 2.Hypertension. Stable. 3.  Myeloproliferative disorder. Stable.       Consultants:  Gastroenterology.   Procedures:  None.   Antibiotics:  None.                   Code Status: Full code.  Family Communication: I discussed the plan with the patient at the bedside.   Disposition Plan: Home when medically stable.  Time spent: 25 minutes.   LOS: 1 day   [place] C   04/03/2015, 9:52 AM

## 2015-04-03 NOTE — Op Note (Signed)
FLEXIBLE SIGMOIDOSCOPY PROCEDURE REPORT  PATIENT:  [name]  MR#:  [id number] Birthdate:  02/23/1941, 74 y.o., female Endoscopist:  Dr. Rogene Houston, MD Referred By:  Dr. Hurshel Party, MD Procedure Date: 04/03/2015  Procedure:   Flexible sigmoidoscopy  Indications: Patient is 74 year old Caucasian female who presents with acute onset of painful hematochezia. She suspected to have ischemic colitis or an infection.  Informed Consent:  The procedure and risks were reviewed with the patient and informed consent was obtained.  Medications:  Demerol 50 mg IV Versed 6 mg IV  Description of procedure:  After a digital rectal exam was performed, that colonoscope was advanced from the anus through the rectum and colon to the area of distal transverse colon. From this level scope was slowly and cautiously withdrawn. The mucosal surfaces were carefully surveyed utilizing scope tip to flexion to facilitate fold flattening as needed. The scope was pulled down into the rectum where a thorough exam including retroflexion was performed.  Findings:   Prep excellent. Normal mucosa of distal transverse colon. Multiple ulcers noted involving mucosa of descending sigmoid colon. Patchy erythema and edema noted to mucosa of proximal half of sigmoid colon. Normal rectal mucosa and anorectal junction.   Therapeutic/Diagnostic Maneuvers Performed:  Stool sample was taken for C. difficile culture and O&P. Biopsies taken from mucosa of descending and sigmoid colon and submitted separately.  Complications:  None  Impression:  Acute colitis with multiple ulcers involving descending colon and mild changes involving proximal half of sigmoid colon. Endoscopic appearance suggestive of ischemic colitis although distribution is somewhat atypical. Stool sample taken for routine studies as above. Biopsies taken from descending and sigmoid colon and submitted separately  Recommendations:  Full liquid  diet. Cipro 500 mg by mouth twice a day. Metronidazole 250 mg by mouth 3 times a day. CBC and metabolic 7 in a.m.  Avigdor Dollar U  04/03/2015 11:24 AM  CC: Dr. [name], MD & Dr. Rayne Du ref. provider found

## 2015-04-04 LAB — BASIC METABOLIC PANEL
Anion gap: 8 (ref 5–15)
BUN: 9 mg/dL (ref 6–20)
CO2: 26 mmol/L (ref 22–32)
Calcium: 8.2 mg/dL — ABNORMAL LOW (ref 8.9–10.3)
Chloride: 105 mmol/L (ref 101–111)
Creatinine, Ser: 0.54 mg/dL (ref 0.44–1.00)
Glucose, Bld: 100 mg/dL — ABNORMAL HIGH (ref 65–99)
Potassium: 3.7 mmol/L (ref 3.5–5.1)
Sodium: 139 mmol/L (ref 135–145)

## 2015-04-04 LAB — CBC
HCT: 26.8 % — ABNORMAL LOW (ref 36.0–46.0)
Hemoglobin: 8.6 g/dL — ABNORMAL LOW (ref 12.0–15.0)
MCH: 36.4 pg — ABNORMAL HIGH (ref 26.0–34.0)
MCHC: 32.1 g/dL (ref 30.0–36.0)
MCV: 113.6 fL — AB (ref 78.0–100.0)
Platelets: 243 10*3/uL (ref 150–400)
RBC: 2.36 MIL/uL — AB (ref 3.87–5.11)
RDW: 14.1 % (ref 11.5–15.5)
WBC: 6 10*3/uL (ref 4.0–10.5)

## 2015-04-04 MED ORDER — CIPROFLOXACIN HCL 500 MG PO TABS: 500.0000 mg | ORAL_TABLET | Freq: Two times a day (BID) | ORAL | Status: DC

## 2015-04-04 MED ORDER — METRONIDAZOLE 250 MG PO TABS: 250.0000 mg | ORAL_TABLET | Freq: Three times a day (TID) | ORAL | Status: DC

## 2015-04-04 NOTE — Discharge Summary (Signed)
Physician Discharge Summary  Patient ID: [name] MRN: [id number] DOB/AGE: 1941/09/02 74 y.o. Primary Care Physician:FAGAN,ROY, MD Admit date: 04/02/2015 Discharge date: 04/04/2015    Discharge Diagnoses:  1. Rectal bleeding, improved. Hemoglobin stable at 8.6 upon discharge 2. Probable ischemic colitis on flexible sigmoidoscopy. 3. Hypertension. Stable. 4. Obesity.     Medication List    STOP taking these medications        hydrochlorothiazide 25 MG tablet  Commonly known as:  HYDRODIURIL     methocarbamol 500 MG tablet  Commonly known as:  ROBAXIN      TAKE these medications        albuterol 108 (90 BASE) MCG/ACT inhaler  Commonly known as:  PROVENTIL HFA;VENTOLIN HFA  Inhale 2 puffs into the lungs every 6 (six) hours as needed for wheezing or shortness of breath.     aspirin 325 MG EC tablet  Take 1 tablet (325 mg total) by mouth daily with breakfast.     atenolol 50 MG tablet  Commonly known as:  TENORMIN  Take 25 mg by mouth 2 (two) times daily.     ciprofloxacin 500 MG tablet  Commonly known as:  CIPRO  Take 1 tablet (500 mg total) by mouth 2 (two) times daily.     cloNIDine 0.3 MG tablet  Commonly known as:  CATAPRES  Take 0.15 mg by mouth 2 (two) times daily.     diltiazem 180 MG 24 hr capsule  Commonly known as:  CARDIZEM CD  Take 180 mg by mouth at bedtime.     docusate sodium 100 MG capsule  Commonly known as:  COLACE  Take 100 mg by mouth as needed.     esomeprazole 40 MG capsule  Commonly known as:  NEXIUM  Take 1 capsule (40 mg total) by mouth every morning.     GAS RELIEF PO  Take 1 tablet by mouth as needed (gas).     HYDROcodone-acetaminophen 10-325 MG per tablet  Commonly known as:  NORCO  Take 1-2 tablets by mouth every 6 (six) hours as needed (breakthrough pain).     hydroxyurea 500 MG capsule  Commonly known as:  HYDREA  Take 2 capsules (1,000 mg total) by mouth daily.     losartan 100 MG tablet  Commonly known as:   COZAAR  Take 100 mg by mouth daily.     metroNIDAZOLE 250 MG tablet  Commonly known as:  FLAGYL  Take 1 tablet (250 mg total) by mouth every 8 (eight) hours.     simvastatin 10 MG tablet  Commonly known as:  ZOCOR  Take 10 mg by mouth daily after supper.     traZODone 50 MG tablet  Commonly known as:  DESYREL  Take 1 tablet by mouth at bedtime.        Discharged Condition: Stable.    Consults: Gastroenterology.  Significant Diagnostic Studies: Dg Chest 2 View  03/12/2015   CLINICAL DATA:  Preop for knee arthroplasty  EXAM: CHEST  2 VIEW  COMPARISON:  02/27/2014  FINDINGS: Cardiomediastinal silhouette is stable. There is chronic elevation of the right hemidiaphragm. Mild basilar atelectasis. Stable linear atelectasis or scarring in lingula. Mild degenerative changes thoracic spine. No acute infiltrate or pulmonary edema.  IMPRESSION: No active disease. Chronic elevation of the right hemidiaphragm again noted. Mild basilar atelectasis. Stable linear atelectasis or scarring in lingula.   Electronically Signed   By: Lahoma Crocker M.D.   On: 03/12/2015 10:10    Lab Results:  Basic Metabolic Panel:  Recent Labs  04/02/15 1106 04/03/15 0628 04/04/15 0118  NA 136 139 139  K 3.3* 3.7 3.7  CL 99* 103 105  CO2 28 29 26   GLUCOSE 119* 106* 100*  BUN 14 10 9   CREATININE 0.64 0.61 0.54  CALCIUM 8.7* 8.7* 8.2*  MG 1.7  --   --    Liver Function Tests:  Recent Labs  04/03/15 0628  AST 13*  ALT 12*  ALKPHOS 82  BILITOT 1.0  PROT 6.3*  ALBUMIN 3.0*     CBC:  Recent Labs  04/02/15 1106  04/03/15 1403 04/04/15 0118  WBC 8.6  < > 7.6 6.0  NEUTROABS 7.0  --   --   --   HGB 9.5*  < > 9.4* 8.6*  HCT 29.1*  < > 28.5* 26.8*  MCV 112.8*  < > 112.6* 113.6*  PLT 301  < > 264 243  < > = values in this interval not displayed.  Recent Results (from the past 240 hour(s))  Clostridium Difficile by PCR (not at [facility])     Status: None   Collection Time: 04/03/15 10:59 AM  Result  Value Ref Range Status   C difficile by pcr NEGATIVE NEGATIVE Final     Hospital Course: This very pleasant lady of 74 result was admitted with symptoms of rectal bleeding. Please see initial history as outlined below: HPI: [name] is a 74 y.o. female with a past medical history that includes polycythemia vera, myeloproliferative disorder, hypertension, GERD, hiatal hernia, dysrhythmia, endometrial cancer status post hysterectomy, bronchitis, status post left knee replacement approximately 2 weeks ago presents to the emergency department with chief complaint of bright red blood per rectum. Initial evaluation reveals a hemoglobin of 9.5 (chart review indicates hemoglobin was 14 3 weeks ago), tests and level III.3 and blood pressure on the low end of normal.  She reports being in her usual state of health until about 4:00 yesterday afternoon when she developed bright red blood per rectum. She reports passing bright red blood every 15 minutes from 4 PM yesterday until 8 AM this morning. Associated symptoms include mild intermittent abdominal cramping mostly in the lower quadrants, generalized weakness. She reports taking Celebrex up until 3 weeks ago when she had her knee replaced. Since then she denies any NSAIDs or any tenderness. She does take aspirin whose dose was increased to 325 mg 3 weeks ago. She denies any transfusions when she had her knee replaced. She denies chest pain palpitation headache dizziness syncope or near-syncope. She denies any worsening shortness of breath she has a history of bronchitis but is not on oxygen at home. She does endorse orthopnea for the last several years. He denies nausea vomiting dysuria hematuria frequency or urgency. He denies fever chills or recent sick contacts. She does report having had a colonoscopy in 2013 use results reportedly were normal.  Workup in the emergency department includes complete blood count significant for RBCs 2.58 hemoglobin 9.5  hematocrit 09.7, basic metabolic panel with potassium of 3.3 chloride 99 calcium 8.7 glucose 119. EKG with sinus rhythm and a right bundle branch block. She is afebrile hemodynamically stable with a blood pressure this on the low end of normal. In the emergency department IV normal saline at 100 mils an hour was initiated. Throughout her hospitalization, her hemoglobin was stable. She did not require blood transfusion. In fact, soon after admission, rectal bleeding stopped. She was seen by gastroneurology, Dr. Laural Golden, who performed flexible  sigmoidoscopy. He found acute colitis with multiple ulcers involving descending colon and mild changes involving proximal half of sigmoid colon. The endoscopic appearances were suggestive of ischemic colitis although the distribution was somewhat atypical. He empirically started on oral anti-biotics with ciprofloxacin and metronidazole which should be continued for 1 more week. She is stable for discharge and will follow with her primary care physician. Discharge Exam: Blood pressure 140/56, pulse 81, temperature 98.1 F (36.7 C), temperature source Oral, resp. rate 20, height 5\' 4"  (1.626 m), weight 127.007 kg (280 lb), SpO2 95 %. She looks systemic well. She is not pale. Heart sounds are present without murmurs or added sounds. Her abdomen is soft and nontender. She is alert and oriented.  Disposition: Home.      Discharge Instructions    Diet - low sodium heart healthy    Complete by:  As directed      Increase activity slowly    Complete by:  As directed              Signed: [place] C   04/04/2015, 10:07 AM

## 2015-04-04 NOTE — Progress Notes (Signed)
  Subjective:  Patient denies abdominal pain nausea or vomiting. She's been passing flatness she has not had a bowel movement since flexible sigmoidoscopy yesterday. She is hungry. She states she could not sleep last night and is quite upset.  Objective: Blood pressure 140/56, pulse 81, temperature 98.1 F (36.7 C), temperature source Oral, resp. rate 20, height 5\' 4"  (1.626 m), weight 280 lb (127.007 kg), SpO2 95 %. Patient is alert and appears to be comfortable in chair. Abdomen abdomen is full and soft. No tenderness noted in left upper quadrant as before.   Labs/studies Results:   Recent Labs  04/03/15 0628 04/03/15 1403 04/04/15 0118  WBC 7.3 7.6 6.0  HGB 9.1* 9.4* 8.6*  HCT 28.5* 28.5* 26.8*  PLT 260 264 243    BMET   Recent Labs  04/02/15 1106 04/03/15 0628 04/04/15 0118  NA 136 139 139  K 3.3* 3.7 3.7  CL 99* 103 105  CO2 28 29 26   GLUCOSE 119* 106* 100*  BUN 14 10 9   CREATININE 0.64 0.61 0.54  CALCIUM 8.7* 8.7* 8.2*    LFT   Recent Labs  04/03/15 0628  PROT 6.3*  ALBUMIN 3.0*  AST 13*  ALT 12*  ALKPHOS 82  BILITOT 1.0    Stool C. difficile by PCR is negative.  Assessment:  #1. Painful hematochezia secondary to acute colitis. Endoscopic appearance suggestive of ischemic colitis. Stool studies not complete yet. Patient is on Cipro and metronidazole and appears to be improving. #2. Anemia secondary to GI bleed. No bleeding noted in over 24 hours.  Recommendations;  Agree with discharge planning. Low residue diet for the next 3 days. Resume aspirin on 04/06/2015. I will be contacting patient with results of biopsy and stool studies. She will have CBC by Dr. Willey Blade on 04/12/2015.

## 2015-04-04 NOTE — Progress Notes (Signed)
Patient received discharge instructions along with follow up appointments and prescriptions. Patient verbalized understanding of all instructions. Patient was escorted by staff via wheelchair to vehicle. Patient discharged to home in stable condition. 

## 2015-04-06 ENCOUNTER — Ambulatory Visit (HOSPITAL_COMMUNITY): Payer: Medicare Other | Admitting: Physical Therapy

## 2015-04-06 ENCOUNTER — Encounter (HOSPITAL_COMMUNITY): Payer: Self-pay | Admitting: Internal Medicine

## 2015-04-06 DIAGNOSIS — M25662 Stiffness of left knee, not elsewhere classified: Secondary | ICD-10-CM | POA: Diagnosis not present

## 2015-04-06 DIAGNOSIS — R6889 Other general symptoms and signs: Secondary | ICD-10-CM | POA: Diagnosis not present

## 2015-04-06 DIAGNOSIS — R29898 Other symptoms and signs involving the musculoskeletal system: Secondary | ICD-10-CM

## 2015-04-06 DIAGNOSIS — M25562 Pain in left knee: Secondary | ICD-10-CM | POA: Diagnosis not present

## 2015-04-06 DIAGNOSIS — R262 Difficulty in walking, not elsewhere classified: Secondary | ICD-10-CM | POA: Diagnosis not present

## 2015-04-06 DIAGNOSIS — Z471 Aftercare following joint replacement surgery: Secondary | ICD-10-CM | POA: Diagnosis not present

## 2015-04-06 DIAGNOSIS — Z96652 Presence of left artificial knee joint: Secondary | ICD-10-CM | POA: Diagnosis not present

## 2015-04-06 LAB — OVA AND PARASITE EXAMINATION

## 2015-04-06 NOTE — Therapy (Signed)
[facility] [place], [place], [address] Phone: [phone]   Fax:  [phone]  Physical Therapy Treatment  Patient Details  Name: [name] MRN: [id number] Date of Birth: 1941/05/26 Referring Provider:  [name], MD  Encounter Date: 04/06/2015      PT End of Session - 04/06/15 1723    Visit Number 2   Number of Visits 16   Date for PT Re-Evaluation 04/30/15   Authorization Type Medicare   Authorization Time Period 03/31/15 - 05/31/15   Authorization - Visit Number 2   Authorization - Number of Visits 10   PT Start Time 3785   PT Stop Time 1100   PT Time Calculation (min) 45 min   Activity Tolerance Patient limited by fatigue   Behavior During Therapy [facility] for tasks assessed/performed      Past Medical History  Diagnosis Date  . Arthritis   . Hypertension   . GERD (gastroesophageal reflux disease)   . Obesity, morbid (more than 100 lbs over ideal weight or BMI > 40) 08/2011    Ht 5'7", wt 280 lb  . Bronchitis, chronic obstructive since 2000's  . Shortness of breath     unable to lay flat due to dyspnea  . Hiatal hernia   . Hiatal hernia 11/19/2012  . Cancer     dx with endometrial cancer   . Heart murmur   . Myeloproliferative disorder, JAK-2 positive 01/05/2014  . Polycythemia vera(238.4) 01/18/2014  . Gout   . Pneumonia 09/2014  . Dysrhythmia   . PONV (postoperative nausea and vomiting)     post-op hypoxic resp failure 09/2011 and require Bi-Pap (possible r/t - pressure pulm edema vs asp PNA, COPD exacerbation, obesity hypoventilation  . Anemia 04/02/2015  . Obesity     Past Surgical History  Procedure Laterality Date  . Hysteroscopy w/d&c  08/15/2011    Procedure: DILATATION AND CURETTAGE (D&C) /HYSTEROSCOPY;  Surgeon: Jonnie Kind, MD;  Location: [facility];  Service: Gynecology;  Laterality: N/A;  With Suction Curette  . Abdominal hysterectomy  09/12/2011    Procedure: HYSTERECTOMY ABDOMINAL;   Surgeon: Janie Morning, MD;  Location: [facility];  Service: Gynecology;  Laterality: N/A;  Total Abdominal Hysterectomy, Bilateral Salpingo Oophorectomy  . Salpingoophorectomy  09/12/2011    Procedure: SALPINGO OOPHERECTOMY;  Surgeon: Janie Morning, MD;  Location: [facility];  Service: Gynecology;  Laterality: Bilateral;  . Colonoscopy  07/05/2012    Procedure: COLONOSCOPY;  Surgeon: Rogene Houston, MD;  Location: [facility];  Service: Endoscopy;  Laterality: N/A;  730  . Knee arthroplasty Left 03/15/2015    Procedure: COMPUTER ASSISTED TOTAL KNEE ARTHROPLASTY;  Surgeon: Marybelle Killings, MD;  Location: [facility];  Service: Orthopedics;  Laterality: Left;  . Flexible sigmoidoscopy N/A 04/03/2015    Procedure: FLEXIBLE SIGMOIDOSCOPY;  Surgeon: Rogene Houston, MD;  Location: [facility];  Service: Endoscopy;  Laterality: N/A;    There were no vitals filed for this visit.  Visit Diagnosis:  Knee stiffness, left  Weakness of left leg  Decreased functional activity tolerance  Difficulty walking      Subjective Assessment - 04/06/15 1706    Subjective Reporting that she was passing blood out of her rectum last week. She diid go see her physician and ultimately underwent a procedue to address this. She has been doing better since that time regarding this. As far as her exercises go, she has been doing them a little but not  too much. Concerned about the swelling in her lt leg still.    Pain Score 0-No pain                         [facility] Adult PT Treatment/Exercise - 04/06/15 0001    Ambulation/Gait   Ambulation/Gait Yes   Ambulation/Gait Assistance 6: Modified independent (Device/Increase time)   Ambulation Distance (Feet) 50 Feet   Assistive device Rolling walker   Gait Pattern Step-through pattern   Ambulation Surface Level   Knee/Hip Exercises: Stretches   Passive Hamstring Stretch 2 reps;60 seconds   Passive Hamstring Stretch Limitations approximately 3 degrees to full  extension   Quad Stretch Limitations 99 degrees knee flexion in sitting   Knee/Hip Exercises: Seated   Long Arc Quad Strengthening;1 set;10 reps   Heel Slides AAROM  endrange hold and assist 99 degrees   Other Seated Knee Exercises toe/heel raises 1 X 10   Other Seated Knee Exercises seated march 1X10 bilateral                PT Education - 04/06/15 1722    Education provided Yes   Education Details edema control with lower extremity elevation and ice X 15 min with barrier, knee extension stretch in sitting and seated knee flexion.   Person(s) Educated Patient   Methods Explanation;Demonstration   Comprehension Need further instruction          PT Short Term Goals - 03/31/15 2014    PT SHORT TERM GOAL #1   Title Patint will demonstrate increased knee extension to 0 degrees to  be able to ambulate with a normlized stride length.    Time 4   Period Weeks   Status New   PT SHORT TERM GOAL #2   Title Patient will be able to demonstrate knee flexion > 100 degrees to be able to sit to chair withtou compensating through uninvolved LE.    Time 4   Period Weeks   Status New   PT SHORT TERM GOAL #3   Title Patient will be able to dmeonstrate increased standing tolerance to >54minutes to perform stanidng exrcises in therpay.    Time 4   Period Weeks   Status New   PT SHORT TERM GOAL #4   Title Patinet will be able to ambualte with a SPC >71mintues to be able to ambualte short distances in and outside of home.    Time 4   Period Weeks   Status New           PT Long Term Goals - 03/31/15 2018    PT LONG TERM GOAL #1   Title Patient will dmeosntrate increased knee flexon of 120 degrees to be able to squat to a low chair on lt withtou compendsating through Rt LE.    Time 8   Period Weeks   Status New   PT LONG TERM GOAL #2   Title Patient will demosntrate increased hamstring/quadriceps strength of 5/5 to be able to ambualte with notmalized gait speed >1.2 m/s to be  able to ambulate at community gait speed.    Time 8   Period Weeks   Status New   PT LONG TERM GOAL #3   Title Patient will be able to ambualte without assistive device >52minutes to be able to perform grocery shopping withtou assistance withotu pain.    Time 8   Period Weeks   Status New   PT LONG TERM GOAL #4  Title Patint will be indpeendent with advanced HEP.    Time 8   Period Weeks   Status New               Plan - 04/06/15 1725    Clinical Impression Statement Patient with limited ROM in knee extension and flexion. Also with deficits in strength and mobility. Remaining appropriate for on going PT services to address these issues as well as gait and balance in the future.    Pt will benefit from skilled therapeutic intervention in order to improve on the following deficits Abnormal gait;Decreased strength;Difficulty walking;Impaired flexibility;Pain;Hypermobility   Rehab Potential Good   PT Treatment/Interventions Manual techniques;Therapeutic activities;Therapeutic exercise;Balance training;Patient/family education   PT Next Visit Plan Review HEP and ensure patient working on knee flexion and extension. Obtain measures for both. Review edema control techniques as well. Focus on knee flexion.    PT Home Exercise Plan seated knee flexion, extension as well as edema control techniques.    Consulted and Agree with Plan of Care Patient        Problem List Patient Active Problem List   Diagnosis Date Noted  . Hematochezia 04/02/2015  . Rectal bleeding 04/02/2015  . Anemia 04/02/2015  . Bronchitis, chronic obstructive   . Arthritis   . Hypertension   . Dysrhythmia   . Obesity   . Essential hypertension   . Status post total left knee replacement 03/15/2015  . Myeloproliferative disorder, JAK-2 positive 01/05/2014  . Thrombocytosis 12/19/2013  . CMC arthritis, thumb, degenerative 06/03/2013  . Bone, giant cell tumor 06/03/2013  . GERD (gastroesophageal reflux  disease) 11/19/2012  . Hiatal hernia 11/19/2012  . Acute respiratory failure 09/13/2011  . Bilateral pulmonary infiltrates on chest x-ray 09/13/2011  . Leukocytosis 09/13/2011  . Hyperglycemia 09/13/2011  . Endometrial cancer 09/12/2011  . Chronic lung disease 08/16/2011    Class: Chronic  . Obesity, morbid (more than 100 lbs over ideal weight or BMI > 40) 08/07/2011  . KNEE, ARTHRITIS, DEGEN./OSTEO 05/24/2010    Linard Millers, PT, CSCS 04/06/2015, 5:29 PM  [facility] [address] [place], [place], [address] Phone: [phone]   Fax:  [phone]

## 2015-04-07 ENCOUNTER — Ambulatory Visit (HOSPITAL_COMMUNITY): Payer: Medicare Other | Attending: Internal Medicine | Admitting: Physical Therapy

## 2015-04-07 DIAGNOSIS — Z96652 Presence of left artificial knee joint: Secondary | ICD-10-CM | POA: Insufficient documentation

## 2015-04-07 DIAGNOSIS — M25562 Pain in left knee: Secondary | ICD-10-CM | POA: Insufficient documentation

## 2015-04-07 DIAGNOSIS — R6889 Other general symptoms and signs: Secondary | ICD-10-CM | POA: Diagnosis not present

## 2015-04-07 DIAGNOSIS — R262 Difficulty in walking, not elsewhere classified: Secondary | ICD-10-CM | POA: Diagnosis not present

## 2015-04-07 DIAGNOSIS — M25662 Stiffness of left knee, not elsewhere classified: Secondary | ICD-10-CM | POA: Insufficient documentation

## 2015-04-07 DIAGNOSIS — Z471 Aftercare following joint replacement surgery: Secondary | ICD-10-CM | POA: Diagnosis not present

## 2015-04-07 DIAGNOSIS — R29898 Other symptoms and signs involving the musculoskeletal system: Secondary | ICD-10-CM | POA: Insufficient documentation

## 2015-04-07 LAB — STOOL CULTURE

## 2015-04-07 NOTE — Therapy (Signed)
[place], [place], [address] Phone: [phone]   Fax:  [phone]  Physical Therapy Treatment  Patient Details  Name: [name] MRN: [id number] Date of Birth: April 13, 1941 Referring Provider:  [name], MD  Encounter Date: 04/07/2015      PT End of Session - 04/07/15 1259    Visit Number 3   Number of Visits 16   Date for PT Re-Evaluation 04/30/15   Authorization Type Medicare   Authorization Time Period 03/31/15 - 05/31/15   Authorization - Visit Number 3   Authorization - Number of Visits 10   PT Start Time 6945   PT Stop Time 1057   PT Time Calculation (min) 42 min   Activity Tolerance Patient tolerated treatment well   Behavior During Therapy [facility] for tasks assessed/performed      Past Medical History  Diagnosis Date  . Arthritis   . Hypertension   . GERD (gastroesophageal reflux disease)   . Obesity, morbid (more than 100 lbs over ideal weight or BMI > 40) 08/2011    Ht 5'7", wt 280 lb  . Bronchitis, chronic obstructive since 2000's  . Shortness of breath     unable to lay flat due to dyspnea  . Hiatal hernia   . Hiatal hernia 11/19/2012  . Cancer     dx with endometrial cancer   . Heart murmur   . Myeloproliferative disorder, JAK-2 positive 01/05/2014  . Polycythemia vera(238.4) 01/18/2014  . Gout   . Pneumonia 09/2014  . Dysrhythmia   . PONV (postoperative nausea and vomiting)     post-op hypoxic resp failure 09/2011 and require Bi-Pap (possible r/t - pressure pulm edema vs asp PNA, COPD exacerbation, obesity hypoventilation  . Anemia 04/02/2015  . Obesity     Past Surgical History  Procedure Laterality Date  . Hysteroscopy w/d&c  08/15/2011    Procedure: DILATATION AND CURETTAGE (D&C) /HYSTEROSCOPY;  Surgeon: Jonnie Kind, MD;  Location: [facility];  Service: Gynecology;  Laterality: N/A;  With Suction Curette  . Abdominal hysterectomy  09/12/2011    Procedure: HYSTERECTOMY ABDOMINAL;   Surgeon: Janie Morning, MD;  Location: [facility];  Service: Gynecology;  Laterality: N/A;  Total Abdominal Hysterectomy, Bilateral Salpingo Oophorectomy  . Salpingoophorectomy  09/12/2011    Procedure: SALPINGO OOPHERECTOMY;  Surgeon: Janie Morning, MD;  Location: [facility];  Service: Gynecology;  Laterality: Bilateral;  . Colonoscopy  07/05/2012    Procedure: COLONOSCOPY;  Surgeon: Rogene Houston, MD;  [facility];  Service: Endoscopy;  Laterality: N/A;  730  . Knee arthroplasty Left 03/15/2015    Procedure: COMPUTER ASSISTED TOTAL KNEE ARTHROPLASTY;  Surgeon: Marybelle Killings, MD;  Location: [facility];  Service: Orthopedics;  Laterality: Left;  . Flexible sigmoidoscopy N/A 04/03/2015    Procedure: FLEXIBLE SIGMOIDOSCOPY;  Surgeon: Rogene Houston, MD;  [facility];  Service: Endoscopy;  Laterality: N/A;    There were no vitals filed for this visit.  Visit Diagnosis:  Knee stiffness, left  Weakness of left leg  Decreased functional activity tolerance  Difficulty walking  Left knee pain      Subjective Assessment - 04/07/15 1029    Subjective Getting results today from abdominal procedure. Bad afternoon yesterday, drove to [place] with husband. Knee got really sore when got home again. Had to take a pain pill even. Doing good today.    Currently in Pain? No/denies  [facility] Adult PT Treatment/Exercise - 04/07/15 0001    Bed Mobility   Bed Mobility Supine to Sit;Sit to Supine   Supine to Sit 7: Independent   Ambulation/Gait   Ambulation/Gait Yes   Ambulation/Gait Assistance 6: Modified independent (Device/Increase time)   Ambulation Distance (Feet) 50 Feet  times 2   Assistive device Rolling walker   Gait Pattern Step-through pattern   Ambulation Surface Level   Knee/Hip Exercises: Stretches   Passive Hamstring Stretch 3 reps;60 seconds   Passive Hamstring Stretch Limitations in long sitting   Quad Stretch Limitations 105  degrees  seated   Knee/Hip Exercises: Seated   Long Arc Quad Strengthening;1 set;10 reps   Heel Slides AAROM  endrange hold and assist 105 degrees   Heel Slides Limitations pain   Other Seated Knee Exercises seated knee flexion stretch 1X15 with overpressure   Knee/Hip Exercises: Supine   Quad Sets Strengthening;1 set;10 reps  quad activation cues   Heel Slides AAROM;Left;1 set;15 reps  end range assist                PT Education - 04/07/15 1258    Education provided Yes   Education Details ankle pumps and quad sets for airplane ride. reinforced edema control techniques and need for HEP.    Person(s) Educated Patient   Methods Explanation;Demonstration   Comprehension Verbalized understanding          PT Short Term Goals - 03/31/15 2014    PT SHORT TERM GOAL #1   Title Patint will demonstrate increased knee extension to 0 degrees to  be able to ambulate with a normlized stride length.    Time 4   Period Weeks   Status New   PT SHORT TERM GOAL #2   Title Patient will be able to demonstrate knee flexion > 100 degrees to be able to sit to chair withtou compensating through uninvolved LE.    Time 4   Period Weeks   Status New   PT SHORT TERM GOAL #3   Title Patient will be able to dmeonstrate increased standing tolerance to >32minutes to perform stanidng exrcises in therpay.    Time 4   Period Weeks   Status New   PT SHORT TERM GOAL #4   Title Patinet will be able to ambualte with a SPC >22mintues to be able to ambualte short distances in and outside of home.    Time 4   Period Weeks   Status New           PT Long Term Goals - 03/31/15 2018    PT LONG TERM GOAL #1   Title Patient will dmeosntrate increased knee flexon of 120 degrees to be able to squat to a low chair on lt withtou compendsating through Rt LE.    Time 8   Period Weeks   Status New   PT LONG TERM GOAL #2   Title Patient will demosntrate increased hamstring/quadriceps strength of 5/5 to  be able to ambualte with notmalized gait speed >1.2 m/s to be able to ambulate at community gait speed.    Time 8   Period Weeks   Status New   PT LONG TERM GOAL #3   Title Patient will be able to ambualte without assistive device >77minutes to be able to perform grocery shopping withtou assistance withotu pain.    Time 8   Period Weeks   Status New   PT LONG TERM GOAL #4   Title Patint will  be indpeendent with advanced HEP.    Time 8   Period Weeks   Status New               Plan - 04/07/15 1300    Clinical Impression Statement Patient able to increase knee flexion to 100 degrees. Sore with knee extension but near 0 degrees by end of session. Making progress, will continue with plan of care.    Rehab Potential Good   PT Frequency 2x / week   PT Duration 8 weeks   PT Treatment/Interventions Manual techniques;Therapeutic activities;Therapeutic exercise;Balance training;Patient/family education   PT Next Visit Plan Check range of motion in knee flexion and extension. review and modify home exercise program as needed. may reiforce strengthening exercises.    PT Home Exercise Plan seated knee flexion, extension as well as edema control techniques.         Problem List Patient Active Problem List   Diagnosis Date Noted  . Hematochezia 04/02/2015  . Rectal bleeding 04/02/2015  . Anemia 04/02/2015  . Bronchitis, chronic obstructive   . Arthritis   . Hypertension   . Dysrhythmia   . Obesity   . Essential hypertension   . Status post total left knee replacement 03/15/2015  . Myeloproliferative disorder, JAK-2 positive 01/05/2014  . Thrombocytosis 12/19/2013  . CMC arthritis, thumb, degenerative 06/03/2013  . Bone, giant cell tumor 06/03/2013  . GERD (gastroesophageal reflux disease) 11/19/2012  . Hiatal hernia 11/19/2012  . Acute respiratory failure 09/13/2011  . Bilateral pulmonary infiltrates on chest x-ray 09/13/2011  . Leukocytosis 09/13/2011  . Hyperglycemia  09/13/2011  . Endometrial cancer 09/12/2011  . Chronic lung disease 08/16/2011    Class: Chronic  . Obesity, morbid (more than 100 lbs over ideal weight or BMI > 40) 08/07/2011  . KNEE, ARTHRITIS, DEGEN./OSTEO 05/24/2010    Linard Millers PT, CSCS 04/07/2015, 1:52 PM  [place] [address] [place], [place], [address] Phone: [phone]   Fax:  [phone]

## 2015-04-12 ENCOUNTER — Ambulatory Visit (HOSPITAL_COMMUNITY): Payer: Medicare Other | Admitting: Physical Therapy

## 2015-04-12 DIAGNOSIS — R29898 Other symptoms and signs involving the musculoskeletal system: Secondary | ICD-10-CM | POA: Diagnosis not present

## 2015-04-12 DIAGNOSIS — R262 Difficulty in walking, not elsewhere classified: Secondary | ICD-10-CM

## 2015-04-12 DIAGNOSIS — Z471 Aftercare following joint replacement surgery: Secondary | ICD-10-CM | POA: Diagnosis not present

## 2015-04-12 DIAGNOSIS — Z96652 Presence of left artificial knee joint: Secondary | ICD-10-CM | POA: Diagnosis not present

## 2015-04-12 DIAGNOSIS — M25562 Pain in left knee: Secondary | ICD-10-CM | POA: Diagnosis not present

## 2015-04-12 DIAGNOSIS — M25662 Stiffness of left knee, not elsewhere classified: Secondary | ICD-10-CM | POA: Diagnosis not present

## 2015-04-12 DIAGNOSIS — R6889 Other general symptoms and signs: Secondary | ICD-10-CM | POA: Diagnosis not present

## 2015-04-12 NOTE — Therapy (Signed)
[place], [place], [address] Phone: [phone]   Fax:  [phone]  Physical Therapy Treatment  Patient Details  Name: [name] MRN: [id number] Date of Birth: 04/13/1941 Referring Provider:  [name], MD  Encounter Date: 04/12/2015      PT End of Session - 04/12/15 1226    Visit Number 4   Number of Visits 16   Date for PT Re-Evaluation 04/30/15   Authorization Type Medicare   Authorization Time Period 03/31/15 - 05/31/15   Authorization - Visit Number 4   Authorization - Number of Visits 10   PT Start Time 4259   PT Stop Time 1055   PT Time Calculation (min) 40 min      Past Medical History  Diagnosis Date  . Arthritis   . Hypertension   . GERD (gastroesophageal reflux disease)   . Obesity, morbid (more than 100 lbs over ideal weight or BMI > 40) 08/2011    Ht 5'7", wt 280 lb  . Bronchitis, chronic obstructive since 2000's  . Shortness of breath     unable to lay flat due to dyspnea  . Hiatal hernia   . Hiatal hernia 11/19/2012  . Cancer     dx with endometrial cancer   . Heart murmur   . Myeloproliferative disorder, JAK-2 positive 01/05/2014  . Polycythemia vera(238.4) 01/18/2014  . Gout   . Pneumonia 09/2014  . Dysrhythmia   . PONV (postoperative nausea and vomiting)     post-op hypoxic resp failure 09/2011 and require Bi-Pap (possible r/t - pressure pulm edema vs asp PNA, COPD exacerbation, obesity hypoventilation  . Anemia 04/02/2015  . Obesity     Past Surgical History  Procedure Laterality Date  . Hysteroscopy w/d&c  08/15/2011    Procedure: DILATATION AND CURETTAGE (D&C) /HYSTEROSCOPY;  Surgeon: Jonnie Kind, MD;  Location: [facility];  Service: Gynecology;  Laterality: N/A;  With Suction Curette  . Abdominal hysterectomy  09/12/2011    Procedure: HYSTERECTOMY ABDOMINAL;  Surgeon: Janie Morning, MD;  [facility];  Service: Gynecology;  Laterality: N/A;  Total Abdominal  Hysterectomy, Bilateral Salpingo Oophorectomy  . Salpingoophorectomy  09/12/2011    Procedure: SALPINGO OOPHERECTOMY;  Surgeon: Janie Morning, MD;  [facility];  Service: Gynecology;  Laterality: Bilateral;  . Colonoscopy  07/05/2012    Procedure: COLONOSCOPY;  Surgeon: Rogene Houston, MD;  [facility];  Service: Endoscopy;  Laterality: N/A;  730  . Knee arthroplasty Left 03/15/2015    Procedure: COMPUTER ASSISTED TOTAL KNEE ARTHROPLASTY;  Surgeon: Marybelle Killings, MD;  Location: [facility];  Service: Orthopedics;  Laterality: Left;  . Flexible sigmoidoscopy N/A 04/03/2015    Procedure: FLEXIBLE SIGMOIDOSCOPY;  Surgeon: Rogene Houston, MD;  [facility];  Service: Endoscopy;  Laterality: N/A;    There were no vitals filed for this visit.  Visit Diagnosis:  Knee stiffness, left  Weakness of left leg  Decreased functional activity tolerance  Difficulty walking  Left knee pain      Subjective Assessment - 04/12/15 1058    Subjective Went to [place] over the weekend for a graduation. Trip was very busy and got back on Saturday night. Reports still feeling exhausted. Leg is more swollen since the trip. Pain is variable still, having more trouble sleeping than anything.    Currently in Pain? Yes   Pain Score 4  [facility] Adult PT Treatment/Exercise - 04/12/15 0001    Ambulation/Gait   Ambulation/Gait Yes   Ambulation/Gait Assistance 6: Modified independent (Device/Increase time)   Ambulation Distance (Feet) 150 Feet  times 2   Assistive device Rolling walker   Gait Pattern Step-through pattern   Ambulation Surface Level   Knee/Hip Exercises: Stretches   Passive Hamstring Stretch 3 reps;60 seconds   Passive Hamstring Stretch Limitations in long sitting   Quad Stretch Limitations 103 degrees seated   Knee/Hip Exercises: Aerobic   Stationary Bike Nu-step L1 X 5 minutes   Knee/Hip Exercises: Seated   Long Arc Quad  Strengthening;Left;2 sets;10 reps   Heel Slides AAROM  endrange hold and assist 103 degrees   Heel Slides Limitations pain   Other Seated Knee Exercises seated knee flexion stretch 1X15 with overpressure   Knee/Hip Exercises: Supine   Quad Sets Strengthening;1 set;10 reps  quad activation cues   Heel Slides AAROM;Left;1 set;15 reps  end range assist   Manual Therapy   Manual therapy comments patellar mobilization superior/inferior/medial/lateral                PT Education - 04/12/15 1225    Education provided Yes   Education Details Reviewed need for elevation of Lt. leg today due to swelling. Rinforced need to continue with home exercises.    Person(s) Educated Patient   Methods Explanation   Comprehension Verbalized understanding          PT Short Term Goals - 03/31/15 2014    PT SHORT TERM GOAL #1   Title Patint will demonstrate increased knee extension to 0 degrees to  be able to ambulate with a normlized stride length.    Time 4   Period Weeks   Status New   PT SHORT TERM GOAL #2   Title Patient will be able to demonstrate knee flexion > 100 degrees to be able to sit to chair withtou compensating through uninvolved LE.    Time 4   Period Weeks   Status New   PT SHORT TERM GOAL #3   Title Patient will be able to dmeonstrate increased standing tolerance to >52minutes to perform stanidng exrcises in therpay.    Time 4   Period Weeks   Status New   PT SHORT TERM GOAL #4   Title Patinet will be able to ambualte with a SPC >81mintues to be able to ambualte short distances in and outside of home.    Time 4   Period Weeks   Status New           PT Long Term Goals - 03/31/15 2018    PT LONG TERM GOAL #1   Title Patient will dmeosntrate increased knee flexon of 120 degrees to be able to squat to a low chair on lt withtou compendsating through Rt LE.    Time 8   Period Weeks   Status New   PT LONG TERM GOAL #2   Title Patient will demosntrate increased  hamstring/quadriceps strength of 5/5 to be able to ambualte with notmalized gait speed >1.2 m/s to be able to ambulate at community gait speed.    Time 8   Period Weeks   Status New   PT LONG TERM GOAL #3   Title Patient will be able to ambualte without assistive device >31minutes to be able to perform grocery shopping withtou assistance withotu pain.    Time 8   Period Weeks   Status New   PT LONG TERM  GOAL #4   Title Patint will be indpeendent with advanced HEP.    Time 8   Period Weeks   Status New               Plan - 04/12/15 1227    Clinical Impression Statement Patient reporting that it may not have been a good idea to go on the trip to [place]. She states that she is really sore and her leg feel more swollen. Observation of patient's Lt leg reveals significant edema with mild pitting. Encouraged edema control techniques until next appointment. Patient had a slight decrease in her knee flexion today compared to our last session. Overall session today was decreased in intensity due to patients reports of feeling tired and sore. Will continue to benefit from oingoing PT to work on knee range and strenght.    Pt will benefit from skilled therapeutic intervention in order to improve on the following deficits Abnormal gait;Decreased strength;Difficulty walking;Impaired flexibility;Pain;Hypermobility   Rehab Potential Good   PT Frequency 2x / week   PT Duration 8 weeks   PT Treatment/Interventions Manual techniques;Therapeutic activities;Therapeutic exercise;Patient/family education   PT Next Visit Plan Check range of motion in knee flexion and extension. review and modify home exercise program as needed. may reiforce strengthening exercises.    PT Home Exercise Plan seated knee flexion, extension. possible start of sit/stand exercies if appropriate.    Consulted and Agree with Plan of Care Patient        Problem List Patient Active Problem List   Diagnosis Date Noted  .  Hematochezia 04/02/2015  . Rectal bleeding 04/02/2015  . Anemia 04/02/2015  . Bronchitis, chronic obstructive   . Arthritis   . Hypertension   . Dysrhythmia   . Obesity   . Essential hypertension   . Status post total left knee replacement 03/15/2015  . Myeloproliferative disorder, JAK-2 positive 01/05/2014  . Thrombocytosis 12/19/2013  . CMC arthritis, thumb, degenerative 06/03/2013  . Bone, giant cell tumor 06/03/2013  . GERD (gastroesophageal reflux disease) 11/19/2012  . Hiatal hernia 11/19/2012  . Acute respiratory failure 09/13/2011  . Bilateral pulmonary infiltrates on chest x-ray 09/13/2011  . Leukocytosis 09/13/2011  . Hyperglycemia 09/13/2011  . Endometrial cancer 09/12/2011  . Chronic lung disease 08/16/2011    Class: Chronic  . Obesity, morbid (more than 100 lbs over ideal weight or BMI > 40) 08/07/2011  . KNEE, ARTHRITIS, DEGEN./OSTEO 05/24/2010    Cassell Clement, PT, CSCS  04/12/2015, 12:34 PM  [facility] [address] [place], [place], [address] Phone: [phone]   Fax:  [phone]

## 2015-04-14 ENCOUNTER — Ambulatory Visit (HOSPITAL_COMMUNITY): Payer: Medicare Other | Admitting: Physical Therapy

## 2015-04-14 DIAGNOSIS — Z471 Aftercare following joint replacement surgery: Secondary | ICD-10-CM | POA: Diagnosis not present

## 2015-04-14 DIAGNOSIS — R29898 Other symptoms and signs involving the musculoskeletal system: Secondary | ICD-10-CM

## 2015-04-14 DIAGNOSIS — R262 Difficulty in walking, not elsewhere classified: Secondary | ICD-10-CM

## 2015-04-14 DIAGNOSIS — Z96652 Presence of left artificial knee joint: Secondary | ICD-10-CM | POA: Diagnosis not present

## 2015-04-14 DIAGNOSIS — M25662 Stiffness of left knee, not elsewhere classified: Secondary | ICD-10-CM | POA: Diagnosis not present

## 2015-04-14 DIAGNOSIS — M25562 Pain in left knee: Secondary | ICD-10-CM | POA: Diagnosis not present

## 2015-04-14 DIAGNOSIS — R6889 Other general symptoms and signs: Secondary | ICD-10-CM | POA: Diagnosis not present

## 2015-04-14 NOTE — Therapy (Signed)
[name], [place], [address] Phone: [phone]   Fax:  [phone]  Physical Therapy Treatment  Patient Details  Name: [name] MRN: [id number] Date of Birth: [birth year] Referring Provider:  [name], MD  Encounter Date: 04/14/2015      PT End of Session - 04/14/15 1253    Visit Number 5   Number of Visits 16   Date for PT Re-Evaluation 04/30/15   Authorization Type Medicare   Authorization Time Period 03/31/15 - 05/31/15   Authorization - Visit Number 5   Authorization - Number of Visits 10   PT Start Time 8341   PT Stop Time 1055   PT Time Calculation (min) 40 min   Activity Tolerance Patient tolerated treatment well   Behavior During Therapy [facility] for tasks assessed/performed      Past Medical History  Diagnosis Date  . Arthritis   . Hypertension   . GERD (gastroesophageal reflux disease)   . Obesity, morbid (more than 100 lbs over ideal weight or BMI > 40) 08/2011    Ht 5'7", wt 280 lb  . Bronchitis, chronic obstructive since 2000's  . Shortness of breath     unable to lay flat due to dyspnea  . Hiatal hernia   . Hiatal hernia 11/19/2012  . Cancer     dx with endometrial cancer   . Heart murmur   . Myeloproliferative disorder, JAK-2 positive 01/05/2014  . Polycythemia vera(238.4) 01/18/2014  . Gout   . Pneumonia 09/2014  . Dysrhythmia   . PONV (postoperative nausea and vomiting)     post-op hypoxic resp failure 09/2011 and require Bi-Pap (possible r/t - pressure pulm edema vs asp PNA, COPD exacerbation, obesity hypoventilation  . Anemia 04/02/2015  . Obesity     Past Surgical History  Procedure Laterality Date  . Hysteroscopy w/d&c  08/15/2011    Procedure: DILATATION AND CURETTAGE (D&C) /HYSTEROSCOPY;  Surgeon: Jonnie Kind, MD;  [facility]: [facility];  Service: Gynecology;  Laterality: N/A;  With Suction Curette  . Abdominal hysterectomy  09/12/2011    Procedure: HYSTERECTOMY ABDOMINAL;   Surgeon: Janie Morning, MD;  Location: [facility];  Service: Gynecology;  Laterality: N/A;  Total Abdominal Hysterectomy, Bilateral Salpingo Oophorectomy  . Salpingoophorectomy  09/12/2011    Procedure: SALPINGO OOPHERECTOMY;  Surgeon: Janie Morning, MD;  Location: [facility];  Service: Gynecology;  Laterality: Bilateral;  . Colonoscopy  07/05/2012    Procedure: COLONOSCOPY;  Surgeon: Rogene Houston, MD;  [facility];  Service: Endoscopy;  Laterality: N/A;  730  . Knee arthroplasty Left 03/15/2015    Procedure: COMPUTER ASSISTED TOTAL KNEE ARTHROPLASTY;  Surgeon: Marybelle Killings, MD;  Location: [place];  Service: Orthopedics;  Laterality: Left;  . Flexible sigmoidoscopy N/A 04/03/2015    Procedure: FLEXIBLE SIGMOIDOSCOPY;  Surgeon: Rogene Houston, MD;  [facility];  Service: Endoscopy;  Laterality: N/A;    There were no vitals filed for this visit.  Visit Diagnosis:  Knee stiffness, left  Weakness of left leg  Decreased functional activity tolerance  Difficulty walking      Subjective Assessment - 04/14/15 1019    Subjective Doing good, went on a drive yesterday and was really sore by the time she got back.    Currently in Pain? No/denies                         [facility] Adult PT  Treatment/Exercise - 04/14/15 0001    Ambulation/Gait   Ambulation/Gait Yes   Ambulation/Gait Assistance 6: Modified independent (Device/Increase time)   Ambulation Distance (Feet) 150 Feet  times 2   Assistive device Rolling walker   Gait Pattern Step-through pattern   Knee/Hip Exercises: Stretches   Passive Hamstring Stretch 3 reps;60 seconds   Passive Hamstring Stretch Limitations in long sitting   Quad Stretch Limitations 115 degrees seated   Knee/Hip Exercises: Standing   Knee Flexion 2 sets;5 reps   Functional Squat 1 set;10 reps   Knee/Hip Exercises: Seated   Long Arc Quad Strengthening;Left;2 sets;10 reps   Heel Slides AAROM  endrange hold and assist 103 degrees    Heel Slides Limitations pain   Other Seated Knee Exercises seated knee flexion stretch 1X15 with overpressure   Knee/Hip Exercises: Supine   Quad Sets Strengthening;1 set;10 reps  quad activation cues   Heel Slides AAROM;Left;1 set;15 reps  end range assist   Manual Therapy   Manual therapy comments patellar mobilization superior/inferior/medial/lateral                PT Education - 04/14/15 1252    Education provided Yes   Education Details advised to be aware of activity and avoid long periods of traveling if possible. To continue with Home program.   Person(s) Educated Patient   Methods Explanation   Comprehension Verbalized understanding          PT Short Term Goals - 03/31/15 2014    PT SHORT TERM GOAL #1   Title Patint will demonstrate increased knee extension to 0 degrees to  be able to ambulate with a normlized stride length.    Time 4   Period Weeks   Status New   PT SHORT TERM GOAL #2   Title Patient will be able to demonstrate knee flexion > 100 degrees to be able to sit to chair withtou compensating through uninvolved LE.    Time 4   Period Weeks   Status New   PT SHORT TERM GOAL #3   Title Patient will be able to dmeonstrate increased standing tolerance to >53minutes to perform stanidng exrcises in therpay.    Time 4   Period Weeks   Status New   PT SHORT TERM GOAL #4   Title Patinet will be able to ambualte with a SPC >44mintues to be able to ambualte short distances in and outside of home.    Time 4   Period Weeks   Status New           PT Long Term Goals - 03/31/15 2018    PT LONG TERM GOAL #1   Title Patient will dmeosntrate increased knee flexon of 120 degrees to be able to squat to a low chair on lt withtou compendsating through Rt LE.    Time 8   Period Weeks   Status New   PT LONG TERM GOAL #2   Title Patient will demosntrate increased hamstring/quadriceps strength of 5/5 to be able to ambualte with notmalized gait speed >1.2  m/s to be able to ambulate at community gait speed.    Time 8   Period Weeks   Status New   PT LONG TERM GOAL #3   Title Patient will be able to ambualte without assistive device >72minutes to be able to perform grocery shopping withtou assistance withotu pain.    Time 8   Period Weeks   Status New   PT LONG TERM GOAL #4  Title Patint will be indpeendent with advanced HEP.    Time 8   Period Weeks   Status New               Plan - 04/14/15 1255    Clinical Impression Statement Patient making progress with knee range of motion. Moving more toward focus on strengthening. Patient fatigues quickly with standing exercises. will increase as tolerated.    PT Frequency 2x / week   PT Treatment/Interventions Manual techniques;Therapeutic activities;Therapeutic exercise;Patient/family education   PT Next Visit Plan Add standing exercises, knee flexion, marching, heel raises.    PT Home Exercise Plan Add standing exercises when appropriate, ensure safe technique   Consulted and Agree with Plan of Care Patient        Problem List Patient Active Problem List   Diagnosis Date Noted  . Hematochezia 04/02/2015  . Rectal bleeding 04/02/2015  . Anemia 04/02/2015  . Bronchitis, chronic obstructive   . Arthritis   . Hypertension   . Dysrhythmia   . Obesity   . Essential hypertension   . Status post total left knee replacement 03/15/2015  . Myeloproliferative disorder, JAK-2 positive 01/05/2014  . Thrombocytosis 12/19/2013  . CMC arthritis, thumb, degenerative 06/03/2013  . Bone, giant cell tumor 06/03/2013  . GERD (gastroesophageal reflux disease) 11/19/2012  . Hiatal hernia 11/19/2012  . Acute respiratory failure 09/13/2011  . Bilateral pulmonary infiltrates on chest x-ray 09/13/2011  . Leukocytosis 09/13/2011  . Hyperglycemia 09/13/2011  . Endometrial cancer 09/12/2011  . Chronic lung disease 08/16/2011    Class: Chronic  . Obesity, morbid (more than 100 lbs over ideal  weight or BMI > 40) 08/07/2011  . KNEE, ARTHRITIS, DEGEN./OSTEO 05/24/2010    Cassell Clement, PT, CSCS  04/14/2015, 1:00 PM  [facility] [address] [place], [place], [address] Phone: [phone]   Fax:  [phone]

## 2015-04-15 ENCOUNTER — Other Ambulatory Visit (HOSPITAL_COMMUNITY): Payer: Self-pay | Admitting: Internal Medicine

## 2015-04-15 ENCOUNTER — Ambulatory Visit (HOSPITAL_COMMUNITY)
Admission: RE | Admit: 2015-04-15 | Discharge: 2015-04-15 | Disposition: A | Discharge status: Home / Self Care | Payer: Medicare Other | Source: Ambulatory Visit | Attending: Internal Medicine | Admitting: Internal Medicine

## 2015-04-15 DIAGNOSIS — Z96652 Presence of left artificial knee joint: Secondary | ICD-10-CM | POA: Insufficient documentation

## 2015-04-15 DIAGNOSIS — H05222 Edema of left orbit: Secondary | ICD-10-CM | POA: Diagnosis not present

## 2015-04-15 DIAGNOSIS — I10 Essential (primary) hypertension: Secondary | ICD-10-CM | POA: Diagnosis not present

## 2015-04-15 DIAGNOSIS — M7989 Other specified soft tissue disorders: Secondary | ICD-10-CM

## 2015-04-15 DIAGNOSIS — R2242 Localized swelling, mass and lump, left lower limb: Secondary | ICD-10-CM | POA: Diagnosis not present

## 2015-04-15 DIAGNOSIS — K559 Vascular disorder of intestine, unspecified: Secondary | ICD-10-CM | POA: Diagnosis not present

## 2015-04-15 DIAGNOSIS — M1990 Unspecified osteoarthritis, unspecified site: Secondary | ICD-10-CM | POA: Diagnosis not present

## 2015-04-21 ENCOUNTER — Ambulatory Visit (HOSPITAL_COMMUNITY): Payer: Medicare Other

## 2015-04-21 DIAGNOSIS — Z96652 Presence of left artificial knee joint: Secondary | ICD-10-CM | POA: Diagnosis not present

## 2015-04-21 DIAGNOSIS — R6889 Other general symptoms and signs: Secondary | ICD-10-CM | POA: Diagnosis not present

## 2015-04-21 DIAGNOSIS — M25562 Pain in left knee: Secondary | ICD-10-CM | POA: Diagnosis not present

## 2015-04-21 DIAGNOSIS — R29898 Other symptoms and signs involving the musculoskeletal system: Secondary | ICD-10-CM | POA: Diagnosis not present

## 2015-04-21 DIAGNOSIS — M25662 Stiffness of left knee, not elsewhere classified: Secondary | ICD-10-CM

## 2015-04-21 DIAGNOSIS — R262 Difficulty in walking, not elsewhere classified: Secondary | ICD-10-CM

## 2015-04-21 DIAGNOSIS — Z471 Aftercare following joint replacement surgery: Secondary | ICD-10-CM | POA: Diagnosis not present

## 2015-04-21 NOTE — Therapy (Signed)
[name], [place], [address] Phone: [phone]   Fax:  [phone]  Physical Therapy Treatment  Patient Details  Name: [name] MRN: [id number] Date of Birth: [birth year] Referring Provider:  [name], MD  Encounter Date: 04/21/2015      PT End of Session - 04/21/15 1023    Visit Number 6   Number of Visits 16   Date for PT Re-Evaluation 04/30/15   Authorization Type Medicare   Authorization Time Period 03/31/15 - 05/31/15   Authorization - Visit Number 6   Authorization - Number of Visits 10   PT Start Time 1012   PT Stop Time 1105   PT Time Calculation (min) 53 min   Activity Tolerance Patient tolerated treatment well   Behavior During Therapy [facility] for tasks assessed/performed      Past Medical History  Diagnosis Date  . Arthritis   . Hypertension   . GERD (gastroesophageal reflux disease)   . Obesity, morbid (more than 100 lbs over ideal weight or BMI > 40) 08/2011    Ht 5'7", wt 280 lb  . Bronchitis, chronic obstructive since 2000's  . Shortness of breath     unable to lay flat due to dyspnea  . Hiatal hernia   . Hiatal hernia 11/19/2012  . Cancer     dx with endometrial cancer   . Heart murmur   . Myeloproliferative disorder, JAK-2 positive 01/05/2014  . Polycythemia vera(238.4) 01/18/2014  . Gout   . Pneumonia 09/2014  . Dysrhythmia   . PONV (postoperative nausea and vomiting)     post-op hypoxic resp failure 09/2011 and require Bi-Pap (possible r/t - pressure pulm edema vs asp PNA, COPD exacerbation, obesity hypoventilation  . Anemia 04/02/2015  . Obesity     Past Surgical History  Procedure Laterality Date  . Hysteroscopy w/d&c  08/15/2011    Procedure: DILATATION AND CURETTAGE (D&C) /HYSTEROSCOPY;  Surgeon: Jonnie Kind, MD;  [facility]: [facility];  Service: Gynecology;  Laterality: N/A;  With Suction Curette  . Abdominal hysterectomy  09/12/2011    Procedure: HYSTERECTOMY  ABDOMINAL;  Surgeon: Janie Morning, MD;  [facility];  Service: Gynecology;  Laterality: N/A;  Total Abdominal Hysterectomy, Bilateral Salpingo Oophorectomy  . Salpingoophorectomy  09/12/2011    Procedure: SALPINGO OOPHERECTOMY;  Surgeon: Janie Morning, MD;  [facility];  Service: Gynecology;  Laterality: Bilateral;  . Colonoscopy  07/05/2012    Procedure: COLONOSCOPY;  Surgeon: Rogene Houston, MD;  [facility];  Service: Endoscopy;  Laterality: N/A;  730  . Knee arthroplasty Left 03/15/2015    Procedure: COMPUTER ASSISTED TOTAL KNEE ARTHROPLASTY;  Surgeon: Marybelle Killings, MD;  Location: [facility];  Service: Orthopedics;  Laterality: Left;  . Flexible sigmoidoscopy N/A 04/03/2015    Procedure: FLEXIBLE SIGMOIDOSCOPY;  Surgeon: Rogene Houston, MD;  [facility];  Service: Endoscopy;  Laterality: N/A;    There were no vitals filed for this visit.  Visit Diagnosis:  Knee stiffness, left  Weakness of left leg  Decreased functional activity tolerance  Difficulty walking  Left knee pain      Subjective Assessment - 04/21/15 1016    Subjective Pt stated she feels like she is getting a kidney infection intermittent pain scale that can increase to 10/10.  No reports of knee pain today, mainly tightness   Currently in Pain? No/denies   Pain Descriptors / Indicators Tightness   Multiple Pain Sites Yes  Pain Score 10   Pain Location --  kidney pain            [facility] PT Assessment - 04/21/15 0001    Assessment   Medical Diagnosis Lt TKA   Onset Date/Surgical Date 03/15/15   Next MD Visit [facility] 04/22/2015              [facility] Adult PT Treatment/Exercise - 04/21/15 0001    Exercises   Exercises Knee/Hip   Knee/Hip Exercises: Stretches   Active Hamstring Stretch 3 reps;30 seconds   Active Hamstring Stretch Limitations 12in step   Knee: Self-Stretch Limitations 10x on 12 in step   Gastroc Stretch 3 reps;30 seconds   Gastroc Stretch Limitations  standing pushing on // bars   Knee/Hip Exercises: Aerobic   Stationary Bike Unavailable today, resume next session   Knee/Hip Exercises: Standing   Heel Raises 10 reps   Heel Raises Limitations toe raises Bil HHA   Knee Flexion Left;10 reps   Functional Squat 1 set;10 reps   Rocker Board 2 minutes   Rocker Board Limitations R/L   Other Standing Knee Exercises Marching 10x   Knee/Hip Exercises: Seated   Heel Slides 10 reps   Knee/Hip Exercises: Supine   Short Arc Quad Sets Left;15 reps   Heel Slides 10 reps   Manual Therapy   Manual Therapy Myofascial release   Manual therapy comments patellar mobilization superior/inferior/medial/lateral   Myofascial Release MFR to scar tissue to reduce adhesions and improve ROM                  PT Short Term Goals - 04/21/15 1025    PT SHORT TERM GOAL #1   Title Patint will demonstrate increased knee extension to 0 degrees to  be able to ambulate with a normlized stride length.    Status On-going   PT SHORT TERM GOAL #2   Title Patient will be able to demonstrate knee flexion > 100 degrees to be able to sit to chair withtou compensating through uninvolved LE.    Status On-going   PT SHORT TERM GOAL #3   Title Patient will be able to dmeonstrate increased standing tolerance to >80minutes to perform stanidng exrcises in therpay.    Baseline 04/21/2015: Able to stand comfortably for 15-20 minutes with HHA   Status On-going   PT SHORT TERM GOAL #4   Title Patinet will be able to ambualte with a SPC >8mintues to be able to ambualte short distances in and outside of home.    Baseline 04/21/2015 Able to walk through [place] with buggie or walker for 20 minutes   Status On-going           PT Long Term Goals - 04/21/15 1027    PT LONG TERM GOAL #1   Title Patient will dmeosntrate increased knee flexon of 120 degrees to be able to squat to a low chair on lt withtou compendsating through Rt LE.    PT LONG TERM GOAL #2   Title Patient  will demosntrate increased hamstring/quadriceps strength of 5/5 to be able to ambualte with notmalized gait speed >1.2 m/s to be able to ambulate at community gait speed.    PT LONG TERM GOAL #3   Title Patient will be able to ambualte without assistive device >30minutes to be able to perform grocery shopping withtou assistance withotu pain.    PT LONG TERM GOAL #4   Title Patint will be indpeendent with advanced HEP.  Plan - 04/21/15 1426    Clinical Impression Statement MD apt scheduled for 04/22/2015.  Reviewed goals and ROM measurements complete.  Session focus on improving knee ROM, functional strengthening and manual technqiues for ROM.  Noted vast improvements with AROM 4-110 this session.  Increased standing exercises to improve activity tolerance and more functional strengthening exercises.  Pt continues to fatigue quickly, requiring seated rest breaks through session.  Pt continues to require RW to ambulate with cueing to increase stride length, equalize stance phase and posture to reduce forward lean.  Pt reports ability to ambulate for 15-20 minutes with HHA required.  Manual techniques complete and pt instructed technqiues to reduce adhesions over scar tissue incision over proximal region, encoughed pt to avoid removing scabs for infection control on distal scar area.  Pt will continue to benefit from skilled intervention to improve AROM, functional strengthening and gait mechanics.   Pt will benefit from skilled therapeutic intervention in order to improve on the following deficits Abnormal gait;Decreased strength;Difficulty walking;Impaired flexibility;Pain;Hypermobility   PT Treatment/Interventions Manual techniques;Therapeutic activities;Therapeutic exercise;Patient/family education   PT Next Visit Plan F/u with MD for thoughts of RTW next Monday.  Continue current PT POC to improve standing and walking tolerance, increase AROM primarily extension then flexion and  progress strengthening as able.  Begin gait training with SPC when appropriate.        Problem List Patient Active Problem List   Diagnosis Date Noted  . Hematochezia 04/02/2015  . Rectal bleeding 04/02/2015  . Anemia 04/02/2015  . Bronchitis, chronic obstructive   . Arthritis   . Hypertension   . Dysrhythmia   . Obesity   . Essential hypertension   . Status post total left knee replacement 03/15/2015  . Myeloproliferative disorder, JAK-2 positive 01/05/2014  . Thrombocytosis 12/19/2013  . CMC arthritis, thumb, degenerative 06/03/2013  . Bone, giant cell tumor 06/03/2013  . GERD (gastroesophageal reflux disease) 11/19/2012  . Hiatal hernia 11/19/2012  . Acute respiratory failure 09/13/2011  . Bilateral pulmonary infiltrates on chest x-ray 09/13/2011  . Leukocytosis 09/13/2011  . Hyperglycemia 09/13/2011  . Endometrial cancer 09/12/2011  . Chronic lung disease 08/16/2011    Class: Chronic  . Obesity, morbid (more than 100 lbs over ideal weight or BMI > 40) 08/07/2011  . KNEE, ARTHRITIS, DEGEN./OSTEO 05/24/2010   Aldona Lento, PTA  Aldona Lento 04/21/2015, 3:12 PM  [facility] [address] [place], [place], [address] Phone: [phone]   Fax:  [phone]

## 2015-04-23 ENCOUNTER — Ambulatory Visit (HOSPITAL_COMMUNITY): Payer: Medicare Other

## 2015-04-23 DIAGNOSIS — R29898 Other symptoms and signs involving the musculoskeletal system: Secondary | ICD-10-CM | POA: Diagnosis not present

## 2015-04-23 DIAGNOSIS — M25662 Stiffness of left knee, not elsewhere classified: Secondary | ICD-10-CM | POA: Diagnosis not present

## 2015-04-23 DIAGNOSIS — R262 Difficulty in walking, not elsewhere classified: Secondary | ICD-10-CM | POA: Diagnosis not present

## 2015-04-23 DIAGNOSIS — N390 Urinary tract infection, site not specified: Secondary | ICD-10-CM | POA: Diagnosis not present

## 2015-04-23 DIAGNOSIS — Z471 Aftercare following joint replacement surgery: Secondary | ICD-10-CM | POA: Diagnosis not present

## 2015-04-23 DIAGNOSIS — Z96652 Presence of left artificial knee joint: Secondary | ICD-10-CM | POA: Diagnosis not present

## 2015-04-23 DIAGNOSIS — R6889 Other general symptoms and signs: Secondary | ICD-10-CM

## 2015-04-23 DIAGNOSIS — M25562 Pain in left knee: Secondary | ICD-10-CM | POA: Diagnosis not present

## 2015-04-23 NOTE — Therapy (Signed)
[place], [place], [address] Phone: [phone]   Fax:  [phone]  Physical Therapy Treatment  Patient Details  Name: [name] MRN: [id number] Date of Birth: [birth year] Referring Provider:  [name], MD  Encounter Date: 04/23/2015      PT End of Session - 04/23/15 1104    Visit Number 7   Number of Visits 16   Date for PT Re-Evaluation 04/30/15   Authorization Type Medicare   Authorization Time Period 03/31/15 - 05/31/15   Authorization - Visit Number 7   Authorization - Number of Visits 10   PT Start Time 0623   PT Stop Time 1106   PT Time Calculation (min) 48 min   Activity Tolerance Patient tolerated treatment well   Behavior During Therapy [facility] for tasks assessed/performed      Past Medical History  Diagnosis Date  . Arthritis   . Hypertension   . GERD (gastroesophageal reflux disease)   . Obesity, morbid (more than 100 lbs over ideal weight or BMI > 40) 08/2011    Ht 5'7", wt 280 lb  . Bronchitis, chronic obstructive since 2000's  . Shortness of breath     unable to lay flat due to dyspnea  . Hiatal hernia   . Hiatal hernia 11/19/2012  . Cancer     dx with endometrial cancer   . Heart murmur   . Myeloproliferative disorder, JAK-2 positive 01/05/2014  . Polycythemia vera(238.4) 01/18/2014  . Gout   . Pneumonia 09/2014  . Dysrhythmia   . PONV (postoperative nausea and vomiting)     post-op hypoxic resp failure 09/2011 and require Bi-Pap (possible r/t - pressure pulm edema vs asp PNA, COPD exacerbation, obesity hypoventilation  . Anemia 04/02/2015  . Obesity     Past Surgical History  Procedure Laterality Date  . Hysteroscopy w/d&c  08/15/2011    Procedure: DILATATION AND CURETTAGE (D&C) /HYSTEROSCOPY;  Surgeon: Jonnie Kind, MD;  [facility];  Service: Gynecology;  Laterality: N/A;  With Suction Curette  . Abdominal hysterectomy  09/12/2011    Procedure: HYSTERECTOMY  ABDOMINAL;  Surgeon: Janie Morning, MD;  [facility];  Service: Gynecology;  Laterality: N/A;  Total Abdominal Hysterectomy, Bilateral Salpingo Oophorectomy  . Salpingoophorectomy  09/12/2011    Procedure: SALPINGO OOPHERECTOMY;  Surgeon: Janie Morning, MD;  [facility];  Service: Gynecology;  Laterality: Bilateral;  . Colonoscopy  07/05/2012    Procedure: COLONOSCOPY;  Surgeon: Rogene Houston, MD;  [facility];  Service: Endoscopy;  Laterality: N/A;  730  . Knee arthroplasty Left 03/15/2015    Procedure: COMPUTER ASSISTED TOTAL KNEE ARTHROPLASTY;  Surgeon: Marybelle Killings, MD;  Location: [place];  Service: Orthopedics;  Laterality: Left;  . Flexible sigmoidoscopy N/A 04/03/2015    Procedure: FLEXIBLE SIGMOIDOSCOPY;  Surgeon: Rogene Houston, MD;  [facility];  Service: Endoscopy;  Laterality: N/A;    There were no vitals filed for this visit.  Visit Diagnosis:  Knee stiffness, left  Weakness of left leg  Decreased functional activity tolerance  Difficulty walking  Left knee pain      Subjective Assessment - 04/23/15 1029    Subjective Pt stated she has apt with primary MD Fagan for kidney infection.  Currently knee is pain free, noted swelling foot and knee, has been icing at home and has starting rubbing incision.  Pt brought her quad cane into dept to make sure proper height and  to learn mechanics with LRAD   Currently in Pain? No/denies             [facility] Adult PT Treatment/Exercise - 04/23/15 0001    Ambulation/Gait   Ambulation/Gait Yes   Ambulation/Gait Assistance 6: Modified independent (Device/Increase time)   Ambulation Distance (Feet) 226 Feet   Assistive device Small based quad cane   Gait Pattern Step-through pattern   Ambulation Surface Level   Exercises   Exercises Knee/Hip   Knee/Hip Exercises: Stretches   Active Hamstring Stretch 3 reps;30 seconds   Active Hamstring Stretch Limitations 12in step   Knee: Self-Stretch  Limitations 10x on 12 in step   Gastroc Stretch 3 reps;30 seconds   Gastroc Stretch Limitations standing pushing on // bars   Knee/Hip Exercises: Aerobic   Stationary Bike Unavailable today, resume next session   Knee/Hip Exercises: Standing   Rocker Board 2 minutes   Rocker Board Limitations R/L and A/P   Gait Training Gait training QC first 3 point then 2 point step pattern   Knee/Hip Exercises: Seated   Other Seated Knee Exercises seated knee flexion stretch 1X15 with overpressure   Knee/Hip Exercises: Supine   Quad Sets Strengthening;1 set;10 reps   Heel Slides 10 reps   Manual Therapy   Manual Therapy Edema management;Myofascial release   Edema Management Retro massage wtih LE elevated    Myofascial Release MFR to scar tissue to reduce adhesions and improve ROM                  PT Short Term Goals - 04/23/15 1426    PT SHORT TERM GOAL #1   Title Patint will demonstrate increased knee extension to 0 degrees to  be able to ambulate with a normlized stride length.    Status On-going   PT SHORT TERM GOAL #2   Title Patient will be able to demonstrate knee flexion > 100 degrees to be able to sit to chair withtou compensating through uninvolved LE.    Status On-going   PT SHORT TERM GOAL #3   Title Patient will be able to dmeonstrate increased standing tolerance to >50minutes to perform stanidng exrcises in therpay.    Status On-going   PT SHORT TERM GOAL #4   Title Patinet will be able to ambualte with a SPC >79mintues to be able to ambualte short distances in and outside of home.    Status On-going           PT Long Term Goals - 04/23/15 1426    PT LONG TERM GOAL #1   Title Patient will dmeosntrate increased knee flexon of 120 degrees to be able to squat to a low chair on lt withtou compendsating through Rt LE.    PT LONG TERM GOAL #2   Title Patient will demosntrate increased hamstring/quadriceps strength of 5/5 to be able to ambualte with notmalized gait speed  >1.2 m/s to be able to ambulate at community gait speed.    PT LONG TERM GOAL #3   Title Patient will be able to ambualte without assistive device >29minutes to be able to perform grocery shopping withtou assistance withotu pain.    PT LONG TERM GOAL #4   Title Patint will be indpeendent with advanced HEP.                Plan - 04/23/15 1421    Clinical Impression Statement Pt stated MD wishes to increase frequency with PT x 3x week for 2 weeks.  Pt  brought in her own QC to session today, cane adjusted to appropriate height and instructed gait mechanics initially with a 3 point step pattern then able to demonstate with 2 point step pattern with min cueing for appropriate sequence, equalized stride length and stance phase.  Pt demonstated safe mechanics with LRAD with no LOB episodes noted.  Pt encouarged to continue ambualting with RW outside but to try walking with QC safely inside home.  Session focus on gait training with LRAD and ROM activities.  Ended sessoin with retro massage for edema control with LE elevated.  No reports of pain through session.     PT Next Visit Plan Continue current PT POC to improve standing and walking tolerance, increase AROM primarily extension then flexion and progress strengthening as able.  Progress to LRAD as able.        Problem List Patient Active Problem List   Diagnosis Date Noted  . Hematochezia 04/02/2015  . Rectal bleeding 04/02/2015  . Anemia 04/02/2015  . Bronchitis, chronic obstructive   . Arthritis   . Hypertension   . Dysrhythmia   . Obesity   . Essential hypertension   . Status post total left knee replacement 03/15/2015  . Myeloproliferative disorder, JAK-2 positive 01/05/2014  . Thrombocytosis 12/19/2013  . CMC arthritis, thumb, degenerative 06/03/2013  . Bone, giant cell tumor 06/03/2013  . GERD (gastroesophageal reflux disease) 11/19/2012  . Hiatal hernia 11/19/2012  . Acute respiratory failure 09/13/2011  . Bilateral  pulmonary infiltrates on chest x-ray 09/13/2011  . Leukocytosis 09/13/2011  . Hyperglycemia 09/13/2011  . Endometrial cancer 09/12/2011  . Chronic lung disease 08/16/2011    Class: Chronic  . Obesity, morbid (more than 100 lbs over ideal weight or BMI > 40) 08/07/2011  . KNEE, ARTHRITIS, DEGEN./OSTEO 05/24/2010   Aldona Lento, PTA  Aldona Lento 04/23/2015, 2:28 PM  [place] [address] [place], [place], [address] Phone: [phone]   Fax:  [phone]

## 2015-04-26 ENCOUNTER — Ambulatory Visit (HOSPITAL_COMMUNITY): Payer: Medicare Other | Admitting: Physical Therapy

## 2015-04-26 DIAGNOSIS — Z471 Aftercare following joint replacement surgery: Secondary | ICD-10-CM | POA: Diagnosis not present

## 2015-04-26 DIAGNOSIS — M25662 Stiffness of left knee, not elsewhere classified: Secondary | ICD-10-CM | POA: Diagnosis not present

## 2015-04-26 DIAGNOSIS — R262 Difficulty in walking, not elsewhere classified: Secondary | ICD-10-CM

## 2015-04-26 DIAGNOSIS — M25562 Pain in left knee: Secondary | ICD-10-CM | POA: Diagnosis not present

## 2015-04-26 DIAGNOSIS — R29898 Other symptoms and signs involving the musculoskeletal system: Secondary | ICD-10-CM | POA: Diagnosis not present

## 2015-04-26 DIAGNOSIS — R6889 Other general symptoms and signs: Secondary | ICD-10-CM | POA: Diagnosis not present

## 2015-04-26 DIAGNOSIS — Z96652 Presence of left artificial knee joint: Secondary | ICD-10-CM | POA: Diagnosis not present

## 2015-04-26 NOTE — Therapy (Signed)
[place], [place], [address] Phone: [phone]   Fax:  [phone]  Physical Therapy Treatment  Patient Details  Name: [name] MRN: [id number] Date of Birth: [birth year] Referring Provider:  [name], MD  Encounter Date: 04/26/2015      PT End of Session - 04/26/15 1145    Visit Number 8   Number of Visits 16   Date for PT Re-Evaluation 04/30/15   Authorization Type Medicare   Authorization Time Period 03/31/15 - 05/31/15   Authorization - Visit Number 8   Authorization - Number of Visits 10   PT Start Time 1106   PT Stop Time 1145   PT Time Calculation (min) 39 min   Equipment Utilized During Treatment Gait belt   Activity Tolerance Patient tolerated treatment well      Past Medical History  Diagnosis Date  . Arthritis   . Hypertension   . GERD (gastroesophageal reflux disease)   . Obesity, morbid (more than 100 lbs over ideal weight or BMI > 40) 08/2011    Ht 5'7", wt 280 lb  . Bronchitis, chronic obstructive since 2000's  . Shortness of breath     unable to lay flat due to dyspnea  . Hiatal hernia   . Hiatal hernia 11/19/2012  . Cancer     dx with endometrial cancer   . Heart murmur   . Myeloproliferative disorder, JAK-2 positive 01/05/2014  . Polycythemia vera(238.4) 01/18/2014  . Gout   . Pneumonia 09/2014  . Dysrhythmia   . PONV (postoperative nausea and vomiting)     post-op hypoxic resp failure 09/2011 and require Bi-Pap (possible r/t - pressure pulm edema vs asp PNA, COPD exacerbation, obesity hypoventilation  . Anemia 04/02/2015  . Obesity     Past Surgical History  Procedure Laterality Date  . Hysteroscopy w/d&c  08/15/2011    Procedure: DILATATION AND CURETTAGE (D&C) /HYSTEROSCOPY;  Surgeon: Jonnie Kind, MD;  [facility];  Service: Gynecology;  Laterality: N/A;  With Suction Curette  . Abdominal hysterectomy  09/12/2011    Procedure: HYSTERECTOMY ABDOMINAL;  Surgeon:  Janie Morning, MD;  [facility];  Service: Gynecology;  Laterality: N/A;  Total Abdominal Hysterectomy, Bilateral Salpingo Oophorectomy  . Salpingoophorectomy  09/12/2011    Procedure: SALPINGO OOPHERECTOMY;  Surgeon: Janie Morning, MD;  [facility];  Service: Gynecology;  Laterality: Bilateral;  . Colonoscopy  07/05/2012    Procedure: COLONOSCOPY;  Surgeon: Rogene Houston, MD;  [facility];  Service: Endoscopy;  Laterality: N/A;  730  . Knee arthroplasty Left 03/15/2015    Procedure: COMPUTER ASSISTED TOTAL KNEE ARTHROPLASTY;  Surgeon: Marybelle Killings, MD;  Location: [place];  Service: Orthopedics;  Laterality: Left;  . Flexible sigmoidoscopy N/A 04/03/2015    Procedure: FLEXIBLE SIGMOIDOSCOPY;  Surgeon: Rogene Houston, MD;  [facility];  Service: Endoscopy;  Laterality: N/A;    There were no vitals filed for this visit.  Visit Diagnosis:  Knee stiffness, left  Weakness of left leg  Decreased functional activity tolerance  Difficulty walking      Subjective Assessment - 04/26/15 1104    Subjective Pt reports that she had pain in her knee last night, but it is not bothering her today. . She felt good using the cane in last session, but she is not comfortable using the cane in the community. She still has difficulty sitting for prolonged periods of time.  Currently in Pain? No/denies             [facility] Adult PT Treatment/Exercise - 04/26/15 0001    Ambulation/Gait   Ambulation/Gait Yes   Ambulation/Gait Assistance 4: Min guard   Ambulation Distance (Feet) 226 Feet   Assistive device Small based quad cane   Gait Pattern Step-through pattern   Knee/Hip Exercises: Stretches   Gastroc Stretch 3 reps;30 seconds   Gastroc Stretch Limitations slantboard   Knee/Hip Exercises: Standing   Knee Flexion Left;10 reps   Forward Lunges 10 reps   Forward Lunges Limitations on 6 inch step   Terminal Knee Extension Left;10 reps   Terminal Knee Extension  Limitations with manual resistance   Lateral Step Up Left;10 reps;Step Height: 4"   Forward Step Up Left;10 reps;Step Height: 4"   Rocker Board 2 minutes   Rocker Board Limitations R/L    SLS 5 reps x 10 seconds with BUE support   Knee/Hip Exercises: Seated   Heel Slides 5 reps   Heel Slides Limitations pt achieved 115 degrees flexion   Knee/Hip Exercises: Supine   Quad Sets Strengthening;1 set;10 reps                  PT Short Term Goals - 04/23/15 1426    PT SHORT TERM GOAL #1   Title Patint will demonstrate increased knee extension to 0 degrees to  be able to ambulate with a normlized stride length.    Status On-going   PT SHORT TERM GOAL #2   Title Patient will be able to demonstrate knee flexion > 100 degrees to be able to sit to chair withtou compensating through uninvolved LE.    Status On-going   PT SHORT TERM GOAL #3   Title Patient will be able to dmeonstrate increased standing tolerance to >23minutes to perform stanidng exrcises in therpay.    Status On-going   PT SHORT TERM GOAL #4   Title Patinet will be able to ambualte with a SPC >18mintues to be able to ambualte short distances in and outside of home.    Status On-going           PT Long Term Goals - 04/23/15 1426    PT LONG TERM GOAL #1   Title Patient will dmeosntrate increased knee flexon of 120 degrees to be able to squat to a low chair on lt withtou compendsating through Rt LE.    PT LONG TERM GOAL #2   Title Patient will demosntrate increased hamstring/quadriceps strength of 5/5 to be able to ambualte with notmalized gait speed >1.2 m/s to be able to ambulate at community gait speed.    PT LONG TERM GOAL #3   Title Patient will be able to ambualte without assistive device >22minutes to be able to perform grocery shopping withtou assistance withotu pain.    PT LONG TERM GOAL #4   Title Patint will be indpeendent with advanced HEP.                Plan - 04/26/15 1134    Clinical  Impression Statement Pt demonstrates improvements in Lt knee AROM, and has demonstrated compliance with her HEP. She requires verbal and tactile cueing to complete knee strengthening and ROM exercises due to impaired posture during exercises. Pt required verbal cueing for proper sequencing when ambulating with SBQC, demonstrating decreased gait speed.   PT Next Visit Plan Perform quick reassessment for MD appt on Thursday        Problem  List Patient Active Problem List   Diagnosis Date Noted  . Hematochezia 04/02/2015  . Rectal bleeding 04/02/2015  . Anemia 04/02/2015  . Bronchitis, chronic obstructive   . Arthritis   . Hypertension   . Dysrhythmia   . Obesity   . Essential hypertension   . Status post total left knee replacement 03/15/2015  . Myeloproliferative disorder, JAK-2 positive 01/05/2014  . Thrombocytosis 12/19/2013  . CMC arthritis, thumb, degenerative 06/03/2013  . Bone, giant cell tumor 06/03/2013  . GERD (gastroesophageal reflux disease) 11/19/2012  . Hiatal hernia 11/19/2012  . Acute respiratory failure 09/13/2011  . Bilateral pulmonary infiltrates on chest x-ray 09/13/2011  . Leukocytosis 09/13/2011  . Hyperglycemia 09/13/2011  . Endometrial cancer 09/12/2011  . Chronic lung disease 08/16/2011    Class: Chronic  . Obesity, morbid (more than 100 lbs over ideal weight or BMI > 40) 08/07/2011  . KNEE, ARTHRITIS, DEGEN./OSTEO 05/24/2010    Rayetta Humphrey, PT CLT [phone] 04/26/2015, 11:47 AM  [place], [place], [address] Phone: [phone]   Fax:  [phone]

## 2015-04-28 ENCOUNTER — Ambulatory Visit (HOSPITAL_COMMUNITY): Payer: Medicare Other

## 2015-04-28 DIAGNOSIS — R6889 Other general symptoms and signs: Secondary | ICD-10-CM

## 2015-04-28 DIAGNOSIS — Z471 Aftercare following joint replacement surgery: Secondary | ICD-10-CM | POA: Diagnosis not present

## 2015-04-28 DIAGNOSIS — M25662 Stiffness of left knee, not elsewhere classified: Secondary | ICD-10-CM

## 2015-04-28 DIAGNOSIS — M25562 Pain in left knee: Secondary | ICD-10-CM

## 2015-04-28 DIAGNOSIS — R262 Difficulty in walking, not elsewhere classified: Secondary | ICD-10-CM | POA: Diagnosis not present

## 2015-04-28 DIAGNOSIS — R29898 Other symptoms and signs involving the musculoskeletal system: Secondary | ICD-10-CM

## 2015-04-28 DIAGNOSIS — Z96652 Presence of left artificial knee joint: Secondary | ICD-10-CM | POA: Diagnosis not present

## 2015-04-28 NOTE — Therapy (Signed)
[place], [place], [address] Phone: [phone]   Fax:  [phone]  Physical Therapy Treatment  Patient Details  Name: [name] MRN: [id number] Date of Birth: 22-Sep-1941 Referring Provider:  [name], MD  Encounter Date: 04/28/2015      PT End of Session - 04/28/15 1019    Visit Number 9   Number of Visits 16   Date for PT Re-Evaluation 04/30/15   Authorization Type Medicare; Gcode complete 9th session   Authorization Time Period 03/31/15 - 05/31/15   Authorization - Visit Number 9   Authorization - Number of Visits 19   PT Start Time 4196   PT Stop Time 1100   PT Time Calculation (min) 42 min   Equipment Utilized During Treatment Gait belt   Activity Tolerance Patient tolerated treatment well   Behavior During Therapy [facility] for tasks assessed/performed      Past Medical History  Diagnosis Date  . Arthritis   . Hypertension   . GERD (gastroesophageal reflux disease)   . Obesity, morbid (more than 100 lbs over ideal weight or BMI > 40) 08/2011    Ht 5'7", wt 280 lb  . Bronchitis, chronic obstructive since 2000's  . Shortness of breath     unable to lay flat due to dyspnea  . Hiatal hernia   . Hiatal hernia 11/19/2012  . Cancer     dx with endometrial cancer   . Heart murmur   . Myeloproliferative disorder, JAK-2 positive 01/05/2014  . Polycythemia vera(238.4) 01/18/2014  . Gout   . Pneumonia 09/2014  . Dysrhythmia   . PONV (postoperative nausea and vomiting)     post-op hypoxic resp failure 09/2011 and require Bi-Pap (possible r/t - pressure pulm edema vs asp PNA, COPD exacerbation, obesity hypoventilation  . Anemia 04/02/2015  . Obesity     Past Surgical History  Procedure Laterality Date  . Hysteroscopy w/d&c  08/15/2011    Procedure: DILATATION AND CURETTAGE (D&C) /HYSTEROSCOPY;  Surgeon: Jonnie Kind, MD;  [facility];  Service: Gynecology;  Laterality: N/A;  With Suction  Curette  . Abdominal hysterectomy  09/12/2011    Procedure: HYSTERECTOMY ABDOMINAL;  Surgeon: Janie Morning, MD;  [facility];  Service: Gynecology;  Laterality: N/A;  Total Abdominal Hysterectomy, Bilateral Salpingo Oophorectomy  . Salpingoophorectomy  09/12/2011    Procedure: SALPINGO OOPHERECTOMY;  Surgeon: Janie Morning, MD;  [facility];  Service: Gynecology;  Laterality: Bilateral;  . Colonoscopy  07/05/2012    Procedure: COLONOSCOPY;  Surgeon: Rogene Houston, MD;  [facility];  Service: Endoscopy;  Laterality: N/A;  730  . Knee arthroplasty Left 03/15/2015    Procedure: COMPUTER ASSISTED TOTAL KNEE ARTHROPLASTY;  Surgeon: Marybelle Killings, MD;  Location: [place];  Service: Orthopedics;  Laterality: Left;  . Flexible sigmoidoscopy N/A 04/03/2015    Procedure: FLEXIBLE SIGMOIDOSCOPY;  Surgeon: Rogene Houston, MD;  [facility];  Service: Endoscopy;  Laterality: N/A;    There were no vitals filed for this visit.  Visit Diagnosis:  Knee stiffness, left  Weakness of left leg  Decreased functional activity tolerance  Difficulty walking  Left knee pain      Subjective Assessment - 04/28/15 1016    Subjective Pt reports MD apt tomorrow, plans to RTW on Monday.  Pt stated she feels confident ambulating with cane around the house, does plan to begin work walking with RW   Currently in Pain?  No/denies                PT Short Term Goals - 04/28/15 1020    PT SHORT TERM GOAL #1   Title Patint will demonstrate increased knee extension to 0 degrees to  be able to ambulate with a normlized stride length.    Baseline 04/28/2015 AROM 4-117 degrees   PT SHORT TERM GOAL #2   Title Patient will be able to demonstrate knee flexion > 100 degrees to be able to sit to chair withtou compensating through uninvolved LE.    Baseline 04/28/2015 AROM 4-117 degrees   Status Achieved   PT SHORT TERM GOAL #3   Title Patient will be able to dmeonstrate increased  standing tolerance to >38minutes to perform stanidng exrcises in therpay.    Baseline 04/28/2015 Able to stand 10-15 minutes without HHA/ 15-20 minutes with HHA   Status On-going   PT SHORT TERM GOAL #4   Title Patinet will be able to ambualte with a SPC >55mintues to be able to ambualte short distances in and outside of home.    Baseline 04/28/2015: Reports ability to walk with cane inside house and drive way for 20 minutes comfortably   Status Achieved           PT Long Term Goals - 04/28/15 1044    PT LONG TERM GOAL #1   Title Patient will dmeosntrate increased knee flexon of 120 degrees to be able to squat to a low chair on lt withtou compendsating through Rt LE.    Baseline 04/28/2015 AROM 4-117 degrees   Status On-going   PT LONG TERM GOAL #2   Title Patient will demosntrate increased hamstring/quadriceps strength of 5/5 to be able to ambualte with notmalized gait speed >1.2 m/s to be able to ambulate at community gait speed.    Status On-going   PT LONG TERM GOAL #3   Title Patient will be able to ambualte without assistive device >82minutes to be able to perform grocery shopping withtou assistance withotu pain.    PT LONG TERM GOAL #4   Title Patint will be indpeendent with advanced HEP.                Plan - 04/28/15 1106    Clinical Impression Statement Reviwed goals, MMT and ROM measurement complete prior MD apt tomorrow.  Pt making great progress with ROM 4-117 degrees and strength is progressing.  Pt currently walks with RW in community and reports confidence ambulating with QC inside and outside around house with husband or son.  Improved percieved functional ability shown with FOTO score.   PT Next Visit Plan Recommend continuing OPPT for 4 more weeks to improve AROM, gait mechaincs with LRAD, and functional strengthening towards goals.            G-Codes - May 17, 2015 1030    Functional Assessment Tool Used foto 44 % limitation   Mobility: Walking and Moving  Around Current Status [phone]) At least 40 percent but less than 60 percent impaired, limited or restricted   Mobility: Walking and Moving Around Goal Status [phone]) At least 20 percent but less than 40 percent impaired, limited or restricted      Problem List Patient Active Problem List   Diagnosis Date Noted  . Hematochezia 04/02/2015  . Rectal bleeding 04/02/2015  . Anemia 04/02/2015  . Bronchitis, chronic obstructive   . Arthritis   . Hypertension   . Dysrhythmia   . Obesity   .  Essential hypertension   . Status post total left knee replacement 03/15/2015  . Myeloproliferative disorder, JAK-2 positive 01/05/2014  . Thrombocytosis 12/19/2013  . CMC arthritis, thumb, degenerative 06/03/2013  . Bone, giant cell tumor 06/03/2013  . GERD (gastroesophageal reflux disease) 11/19/2012  . Hiatal hernia 11/19/2012  . Acute respiratory failure 09/13/2011  . Bilateral pulmonary infiltrates on chest x-ray 09/13/2011  . Leukocytosis 09/13/2011  . Hyperglycemia 09/13/2011  . Endometrial cancer 09/12/2011  . Chronic lung disease 08/16/2011    Class: Chronic  . Obesity, morbid (more than 100 lbs over ideal weight or BMI > 40) 08/07/2011  . KNEE, ARTHRITIS, DEGEN./OSTEO 05/24/2010   Aldona Lento, PTA [place], PT CLT [phone] 04/29/2015, 10:31 AM  [place], [place], [address] Phone: [phone]   Fax:  [phone]

## 2015-04-29 DIAGNOSIS — R262 Difficulty in walking, not elsewhere classified: Secondary | ICD-10-CM | POA: Diagnosis not present

## 2015-04-29 DIAGNOSIS — Z471 Aftercare following joint replacement surgery: Secondary | ICD-10-CM | POA: Diagnosis not present

## 2015-04-29 DIAGNOSIS — Z96652 Presence of left artificial knee joint: Secondary | ICD-10-CM | POA: Diagnosis not present

## 2015-04-29 DIAGNOSIS — R29898 Other symptoms and signs involving the musculoskeletal system: Secondary | ICD-10-CM | POA: Diagnosis not present

## 2015-04-29 DIAGNOSIS — M25562 Pain in left knee: Secondary | ICD-10-CM | POA: Diagnosis not present

## 2015-04-29 DIAGNOSIS — M25662 Stiffness of left knee, not elsewhere classified: Secondary | ICD-10-CM | POA: Diagnosis not present

## 2015-04-29 DIAGNOSIS — R6889 Other general symptoms and signs: Secondary | ICD-10-CM | POA: Diagnosis not present

## 2015-04-30 ENCOUNTER — Encounter (HOSPITAL_COMMUNITY): Payer: Medicare Other | Attending: Hematology & Oncology

## 2015-04-30 DIAGNOSIS — D473 Essential (hemorrhagic) thrombocythemia: Secondary | ICD-10-CM

## 2015-04-30 DIAGNOSIS — D471 Chronic myeloproliferative disease: Secondary | ICD-10-CM | POA: Diagnosis not present

## 2015-04-30 DIAGNOSIS — Z96652 Presence of left artificial knee joint: Secondary | ICD-10-CM | POA: Diagnosis not present

## 2015-04-30 DIAGNOSIS — D75839 Thrombocytosis, unspecified: Secondary | ICD-10-CM

## 2015-04-30 LAB — COMPREHENSIVE METABOLIC PANEL
ALT: 11 U/L — ABNORMAL LOW (ref 14–54)
ANION GAP: 9 (ref 5–15)
AST: 20 U/L (ref 15–41)
Albumin: 3.9 g/dL (ref 3.5–5.0)
Alkaline Phosphatase: 82 U/L (ref 38–126)
BUN: 16 mg/dL (ref 6–20)
CALCIUM: 9.2 mg/dL (ref 8.9–10.3)
CO2: 27 mmol/L (ref 22–32)
CREATININE: 0.72 mg/dL (ref 0.44–1.00)
Chloride: 104 mmol/L (ref 101–111)
GLUCOSE: 108 mg/dL — AB (ref 65–99)
Potassium: 4.1 mmol/L (ref 3.5–5.1)
SODIUM: 140 mmol/L (ref 135–145)
Total Bilirubin: 0.7 mg/dL (ref 0.3–1.2)
Total Protein: 7.6 g/dL (ref 6.5–8.1)

## 2015-04-30 LAB — CBC WITH DIFFERENTIAL/PLATELET
Basophils Absolute: 0 10*3/uL (ref 0.0–0.1)
Basophils Relative: 1 % (ref 0–1)
EOS ABS: 0.1 10*3/uL (ref 0.0–0.7)
EOS PCT: 2 % (ref 0–5)
HCT: 38.5 % (ref 36.0–46.0)
Hemoglobin: 12.4 g/dL (ref 12.0–15.0)
Lymphocytes Relative: 33 % (ref 12–46)
Lymphs Abs: 1.9 10*3/uL (ref 0.7–4.0)
MCH: 35.8 pg — ABNORMAL HIGH (ref 26.0–34.0)
MCHC: 32.2 g/dL (ref 30.0–36.0)
MCV: 111.3 fL — ABNORMAL HIGH (ref 78.0–100.0)
MONOS PCT: 9 % (ref 3–12)
Monocytes Absolute: 0.5 10*3/uL (ref 0.1–1.0)
Neutro Abs: 3.3 10*3/uL (ref 1.7–7.7)
Neutrophils Relative %: 56 % (ref 43–77)
Platelets: 277 10*3/uL (ref 150–400)
RBC: 3.46 MIL/uL — ABNORMAL LOW (ref 3.87–5.11)
RDW: 13.9 % (ref 11.5–15.5)
WBC: 5.9 10*3/uL (ref 4.0–10.5)

## 2015-04-30 NOTE — Progress Notes (Signed)
Labs drawn

## 2015-05-03 ENCOUNTER — Ambulatory Visit (HOSPITAL_COMMUNITY): Payer: Medicare Other | Admitting: Physical Therapy

## 2015-05-03 DIAGNOSIS — R29898 Other symptoms and signs involving the musculoskeletal system: Secondary | ICD-10-CM | POA: Diagnosis not present

## 2015-05-03 DIAGNOSIS — Z471 Aftercare following joint replacement surgery: Secondary | ICD-10-CM | POA: Diagnosis not present

## 2015-05-03 DIAGNOSIS — M25662 Stiffness of left knee, not elsewhere classified: Secondary | ICD-10-CM | POA: Diagnosis not present

## 2015-05-03 DIAGNOSIS — R6889 Other general symptoms and signs: Secondary | ICD-10-CM

## 2015-05-03 DIAGNOSIS — R262 Difficulty in walking, not elsewhere classified: Secondary | ICD-10-CM

## 2015-05-03 DIAGNOSIS — M25562 Pain in left knee: Secondary | ICD-10-CM

## 2015-05-03 DIAGNOSIS — Z96652 Presence of left artificial knee joint: Secondary | ICD-10-CM | POA: Diagnosis not present

## 2015-05-03 NOTE — Therapy (Signed)
[place], [place], [address] Phone: [phone]   Fax:  [phone]  Physical Therapy Treatment  Patient Details  Name: [name] MRN: [id number] Date of Birth: 12-30-1940 Referring Provider:  [name], MD  Encounter Date: 05/03/2015      PT End of Session - 05/03/15 1748    Visit Number 10   Number of Visits 16   Date for PT Re-Evaluation 04/30/15   Authorization Type Medicare; Gcode complete 9th session   Authorization Time Period 03/31/15 - 05/31/15   Authorization - Visit Number 10   Authorization - Number of Visits 19   PT Start Time 1016   PT Stop Time 1058   PT Time Calculation (min) 42 min   Equipment Utilized During Treatment Gait belt   Activity Tolerance Patient tolerated treatment well;Patient limited by fatigue   Behavior During Therapy [facility] for tasks assessed/performed      Past Medical History  Diagnosis Date  . Arthritis   . Hypertension   . GERD (gastroesophageal reflux disease)   . Obesity, morbid (more than 100 lbs over ideal weight or BMI > 40) 08/2011    Ht 5'7", wt 280 lb  . Bronchitis, chronic obstructive since 2000's  . Shortness of breath     unable to lay flat due to dyspnea  . Hiatal hernia   . Hiatal hernia 11/19/2012  . Cancer     dx with endometrial cancer   . Heart murmur   . Myeloproliferative disorder, JAK-2 positive 01/05/2014  . Polycythemia vera(238.4) 01/18/2014  . Gout   . Pneumonia 09/2014  . Dysrhythmia   . PONV (postoperative nausea and vomiting)     post-op hypoxic resp failure 09/2011 and require Bi-Pap (possible r/t - pressure pulm edema vs asp PNA, COPD exacerbation, obesity hypoventilation  . Anemia 04/02/2015  . Obesity     Past Surgical History  Procedure Laterality Date  . Hysteroscopy w/d&c  08/15/2011    Procedure: DILATATION AND CURETTAGE (D&C) /HYSTEROSCOPY;  Surgeon: Jonnie Kind, MD;  [facility]: [facility];  Service: Gynecology;   Laterality: N/A;  With Suction Curette  . Abdominal hysterectomy  09/12/2011    Procedure: HYSTERECTOMY ABDOMINAL;  Surgeon: Janie Morning, MD;  Location: [facility];  Service: Gynecology;  Laterality: N/A;  Total Abdominal Hysterectomy, Bilateral Salpingo Oophorectomy  . Salpingoophorectomy  09/12/2011    Procedure: SALPINGO OOPHERECTOMY;  Surgeon: Janie Morning, MD;  Location[facility];  Service: Gynecology;  Laterality: Bilateral;  . Colonoscopy  07/05/2012    Procedure: COLONOSCOPY;  Surgeon: Rogene Houston, MD;  [facility];  Service: Endoscopy;  Laterality: N/A;  730  . Knee arthroplasty Left 03/15/2015    Procedure: COMPUTER ASSISTED TOTAL KNEE ARTHROPLASTY;  Surgeon: Marybelle Killings, MD;  Location: [facility];  Service: Orthopedics;  Laterality: Left;  . Flexible sigmoidoscopy N/A 04/03/2015    Procedure: FLEXIBLE SIGMOIDOSCOPY;  Surgeon: Rogene Houston, MD;  [facility];  Service: Endoscopy;  Laterality: N/A;    There were no vitals filed for this visit.  Visit Diagnosis:  Knee stiffness, left  Decreased functional activity tolerance  Weakness of left leg  Difficulty walking  Left knee pain      Subjective Assessment - 05/03/15 1020    Subjective Pt denies any pain today. She reports that she feels that she is getting stronger, she is able to move around more easily, she uses her legs more, and she has less  difficulty with walking. 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 She still has pain in her knee at night after she has been on her feet all day. She reports that she does not go up or down stairs, and she does not plan on going up or down stairs in the future because she does not want to injure her R knee. Overall, she feels that she has made good improvements with PT. Her main goal is to be able to walk without an AD.    Pertinent History opatient had Lt TKA 03/15/15, patient states " had always had knee pain fbores that, cortizone shots  helped, but became decreasingly helpful with most recent shot providign no relief. Patient notes Rt knee pain as well resulting in cortizon shots into Rt knee. Patient was at [facility] center where she had 3 PT sessions a week toincrease strength noting imrpoved strength.  Patient states "I do not want to do stairs due to Rt knee pain".  patient owns a 4 pronged cane.    How long can you sit comfortably? 30 minutes   How long can you stand comfortably? around 30 minutes   How long can you walk comfortably? around 30 minutes with RW   Currently in Pain? No/denies   Pain Score --  pain can get to 8/10 at night            [facility] PT Assessment - 05/03/15 0001    Assessment   Medical Diagnosis Lt TKA   Onset Date/Surgical Date 03/15/15   AROM   Right Knee Extension -4   Right Knee Flexion 118   Strength   Right Hip Flexion 4/5   Left Hip Flexion 4-/5   Right Knee Flexion 4/5   Right Knee Extension 4+/5   Left Knee Flexion 4/5   Left Knee Extension 4+/5   Ambulation/Gait   Ambulation/Gait Yes   Ambulation/Gait Assistance 5: Supervision   Ambulation Distance (Feet) 452 Feet   Assistive device Small based quad cane   Gait Pattern Step-through pattern   Ambulation Surface Level   6 minute walk test results    Aerobic Endurance Distance Walked 452              [facility] Adult PT Treatment/Exercise - 05/03/15 0001    Knee/Hip Exercises: Stretches   Knee: Self-Stretch Limitations 10x on 12 in step   Gastroc Stretch 3 reps;30 seconds   Gastroc Stretch Limitations slantboard   Knee/Hip Exercises: Standing   Knee Flexion Left;10 reps   Forward Lunges 10 reps   Forward Lunges Limitations on 6 inch step   Lateral Step Up Left;10 reps;Step Height: 4"   Forward Step Up Left;Step Height: 4";15 reps   Rocker Board 2 minutes   Rocker Board Limitations R/L                 PT Education - 05/03/15 1748    Education provided No          PT Short Term Goals - 04/28/15 1020     PT SHORT TERM GOAL #1   Title Patint will demonstrate increased knee extension to 0 degrees to  be able to ambulate with a normlized stride length.    Baseline 04/28/2015 AROM 4-117 degrees   PT SHORT TERM GOAL #2   Title Patient will be able to demonstrate knee flexion > 100 degrees to be able to sit to chair withtou compensating through uninvolved LE.    Baseline  04/28/2015 AROM 4-117 degrees   Status Achieved   PT SHORT TERM GOAL #3   Title Patient will be able to dmeonstrate increased standing tolerance to >56minutes to perform stanidng exrcises in therpay.    Baseline 04/28/2015 Able to stand 10-15 minutes without HHA/ 15-20 minutes with HHA   Status On-going   PT SHORT TERM GOAL #4   Title Patinet will be able to ambualte with a SPC >37mintues to be able to ambualte short distances in and outside of home.    Baseline 04/28/2015: Reports ability to walk with cane inside house and drive way for 20 minutes comfortably   Status Achieved           PT Long Term Goals - 04/28/15 1044    PT LONG TERM GOAL #1   Title Patient will dmeosntrate increased knee flexon of 120 degrees to be able to squat to a low chair on lt withtou compendsating through Rt LE.    Baseline 04/28/2015 AROM 4-117 degrees   Status On-going   PT LONG TERM GOAL #2   Title Patient will demosntrate increased hamstring/quadriceps strength of 5/5 to be able to ambualte with notmalized gait speed >1.2 m/s to be able to ambulate at community gait speed.    Status On-going   PT LONG TERM GOAL #3   Title Patient will be able to ambualte without assistive device >44minutes to be able to perform grocery shopping withtou assistance withotu pain.    PT LONG TERM GOAL #4   Title Patint will be indpeendent with advanced HEP.                Plan - 05/03/15 1748    Clinical Impression Statement Pt continues to make progress toward LTGs for ROM and ambulation. She demonstrates poor endurance, as evidenced by her 6 minute  walk test, however, she is safe when ambulating with SBQC. She will benefit from continued LE strengthening  and functional activity tolerance training.    PT Next Visit Plan Continue with ROM techniques, gait training with LRAD, and functional strengthening.         Problem List Patient Active Problem List   Diagnosis Date Noted  . Hematochezia 04/02/2015  . Rectal bleeding 04/02/2015  . Anemia 04/02/2015  . Bronchitis, chronic obstructive   . Arthritis   . Hypertension   . Dysrhythmia   . Obesity   . Essential hypertension   . Status post total left knee replacement 03/15/2015  . Myeloproliferative disorder, JAK-2 positive 01/05/2014  . Thrombocytosis 12/19/2013  . CMC arthritis, thumb, degenerative 06/03/2013  . Bone, giant cell tumor 06/03/2013  . GERD (gastroesophageal reflux disease) 11/19/2012  . Hiatal hernia 11/19/2012  . Acute respiratory failure 09/13/2011  . Bilateral pulmonary infiltrates on chest x-ray 09/13/2011  . Leukocytosis 09/13/2011  . Hyperglycemia 09/13/2011  . Endometrial cancer 09/12/2011  . Chronic lung disease 08/16/2011    Class: Chronic  . Obesity, morbid (more than 100 lbs over ideal weight or BMI > 40) 08/07/2011  . KNEE, ARTHRITIS, DEGEN./OSTEO 05/24/2010    Hilma Favors, PT, DPT [phone] 05/03/2015, 5:52 PM  [place] [address] [place], [place], [address] Phone: [phone]   Fax:  [phone]

## 2015-05-04 DIAGNOSIS — Z96652 Presence of left artificial knee joint: Secondary | ICD-10-CM | POA: Diagnosis not present

## 2015-05-05 ENCOUNTER — Encounter (HOSPITAL_COMMUNITY): Payer: Medicare Other | Admitting: Physical Therapy

## 2015-05-11 ENCOUNTER — Encounter (HOSPITAL_COMMUNITY): Payer: Medicare Other | Admitting: Physical Therapy

## 2015-05-13 ENCOUNTER — Encounter (HOSPITAL_COMMUNITY): Payer: Medicare Other

## 2015-05-17 ENCOUNTER — Encounter (HOSPITAL_COMMUNITY): Payer: Medicare Other | Admitting: Physical Therapy

## 2015-05-17 ENCOUNTER — Ambulatory Visit (HOSPITAL_COMMUNITY): Payer: Medicare Other | Attending: Internal Medicine | Admitting: Physical Therapy

## 2015-05-17 DIAGNOSIS — R29898 Other symptoms and signs involving the musculoskeletal system: Secondary | ICD-10-CM | POA: Diagnosis not present

## 2015-05-17 DIAGNOSIS — M25562 Pain in left knee: Secondary | ICD-10-CM | POA: Diagnosis not present

## 2015-05-17 DIAGNOSIS — R6889 Other general symptoms and signs: Secondary | ICD-10-CM | POA: Diagnosis not present

## 2015-05-17 DIAGNOSIS — M25662 Stiffness of left knee, not elsewhere classified: Secondary | ICD-10-CM | POA: Insufficient documentation

## 2015-05-17 DIAGNOSIS — R262 Difficulty in walking, not elsewhere classified: Secondary | ICD-10-CM | POA: Insufficient documentation

## 2015-05-17 NOTE — Therapy (Signed)
[place], [place], [address] Phone: [phone]   Fax:  [phone]  Physical Therapy Treatment  Patient Details  Name: [name] MRN: [id number] Date of Birth: 05-30-1941 Referring Provider:  [name], MD  Encounter Date: 05/17/2015      PT End of Session - 05/17/15 1610    Visit Number 11   Number of Visits 16   Authorization Type Medicare; Gcode complete 9th session   Authorization - Visit Number 11   Authorization - Number of Visits 19   PT Start Time 9798   PT Stop Time 1606   PT Time Calculation (min) 41 min   Activity Tolerance Patient tolerated treatment well      Past Medical History  Diagnosis Date  . Arthritis   . Hypertension   . GERD (gastroesophageal reflux disease)   . Obesity, morbid (more than 100 lbs over ideal weight or BMI > 40) 08/2011    Ht 5'7", wt 280 lb  . Bronchitis, chronic obstructive since 2000's  . Shortness of breath     unable to lay flat due to dyspnea  . Hiatal hernia   . Hiatal hernia 11/19/2012  . Cancer     dx with endometrial cancer   . Heart murmur   . Myeloproliferative disorder, JAK-2 positive 01/05/2014  . Polycythemia vera(238.4) 01/18/2014  . Gout   . Pneumonia 09/2014  . Dysrhythmia   . PONV (postoperative nausea and vomiting)     post-op hypoxic resp failure 09/2011 and require Bi-Pap (possible r/t - pressure pulm edema vs asp PNA, COPD exacerbation, obesity hypoventilation  . Anemia 04/02/2015  . Obesity     Past Surgical History  Procedure Laterality Date  . Hysteroscopy w/d&c  08/15/2011    Procedure: DILATATION AND CURETTAGE (D&C) /HYSTEROSCOPY;  Surgeon: Jonnie Kind, MD;  [facility]: [facility];  Service: Gynecology;  Laterality: N/A;  With Suction Curette  . Abdominal hysterectomy  09/12/2011    Procedure: HYSTERECTOMY ABDOMINAL;  Surgeon: Janie Morning, MD;  [facility];  Service: Gynecology;  Laterality: N/A;  Total Abdominal  Hysterectomy, Bilateral Salpingo Oophorectomy  . Salpingoophorectomy  09/12/2011    Procedure: SALPINGO OOPHERECTOMY;  Surgeon: Janie Morning, MD;  [facility];  Service: Gynecology;  Laterality: Bilateral;  . Colonoscopy  07/05/2012    Procedure: COLONOSCOPY;  Surgeon: Rogene Houston, MD;  [facility];  Service: Endoscopy;  Laterality: N/A;  730  . Knee arthroplasty Left 03/15/2015    Procedure: COMPUTER ASSISTED TOTAL KNEE ARTHROPLASTY;  Surgeon: Marybelle Killings, MD;  Location: [facility];  Service: Orthopedics;  Laterality: Left;  . Flexible sigmoidoscopy N/A 04/03/2015    Procedure: FLEXIBLE SIGMOIDOSCOPY;  Surgeon: Rogene Houston, MD;  [facility];  Service: Endoscopy;  Laterality: N/A;    There were no vitals filed for this visit.  Visit Diagnosis:  Knee stiffness, left  Decreased functional activity tolerance  Weakness of left leg  Difficulty walking  Left knee pain      Subjective Assessment - 05/17/15 1611    Subjective Pt states she is having no pain.  She is doing almost everythng that she wants to now.  She can get into her Lucianne Lei but is still having difficulty with her CRV.  She does not go upstairs and hasn't for years.  Pt states she has compression stockings which were measured and given to her at the [facility] but left marks therefore they told  her not to wear them.     Pertinent History opatient had Lt TKA 03/15/15, patient states " had always had knee pain fbores that, cortizone shots helped, but became decreasingly helpful with most recent shot providign no relief. Patient notes Rt knee pain as well resulting in cortizon shots into Rt knee. Patient was at [facility] center where she had 3 PT sessions a week toincrease strength noting imrpoved strength.  Patient states "I do not want to do stairs due to Rt knee pain".  patient owns a 4 pronged cane.    Currently in Pain? No/denies                 [facility] Adult PT Treatment/Exercise - 05/17/15 0001     Ambulation/Gait   Ambulation/Gait --   Ambulation/Gait Assistance --   Ambulation Distance (Feet) --   Assistive device --   Gait Pattern --   Knee/Hip Exercises: Stretches   Active Hamstring Stretch Left;3 reps;30 seconds   Active Hamstring Stretch Limitations 18"   Knee: Self-Stretch Limitations 10x on 18 in step   Gastroc Stretch 3 reps;30 seconds   Gastroc Stretch Limitations slantboard   Knee/Hip Exercises: Standing   Heel Raises Both;15 reps   Knee Flexion Left;10 reps   Knee Flexion Limitations 3#   Forward Lunges --   Forward Lunges Limitations --   Terminal Knee Extension Strengthening;10 reps   Terminal Knee Extension Limitations with manual resistance   Lateral Step Up Left;15 reps;Step Height: 4"   Forward Step Up Left;15 reps;Step Height: 4"   Functional Squat 10 reps   Rocker Board 2 minutes   Rocker Board Limitations R/L    SLS 5x B with one finger hold    Knee/Hip Exercises: Supine   Quad Sets 10 reps   Short Arc Quad Sets 5 reps   Manual Therapy   Manual Therapy Edema management;Myofascial release   Edema Management Retro massage wtih LE elevated    Myofascial Release MFR to scar tissue to reduce adhesions and improve ROM                  PT Short Term Goals - 04/28/15 1020    PT SHORT TERM GOAL #1   Title Patint will demonstrate increased knee extension to 0 degrees to  be able to ambulate with a normlized stride length.    Baseline 04/28/2015 AROM 4-117 degrees   PT SHORT TERM GOAL #2   Title Patient will be able to demonstrate knee flexion > 100 degrees to be able to sit to chair withtou compensating through uninvolved LE.    Baseline 04/28/2015 AROM 4-117 degrees   Status Achieved   PT SHORT TERM GOAL #3   Title Patient will be able to dmeonstrate increased standing tolerance to >74minutes to perform stanidng exrcises in therpay.    Baseline 04/28/2015 Able to stand 10-15 minutes without HHA/ 15-20 minutes with HHA   Status On-going    PT SHORT TERM GOAL #4   Title Patinet will be able to ambualte with a SPC >32mintues to be able to ambualte short distances in and outside of home.    Baseline 04/28/2015: Reports ability to walk with cane inside house and drive way for 20 minutes comfortably   Status Achieved           PT Long Term Goals - 04/28/15 1044    PT LONG TERM GOAL #1   Title Patient will dmeosntrate increased knee flexon of 120 degrees to be able  to squat to a low chair on lt withtou compendsating through Rt LE.    Baseline 04/28/2015 AROM 4-117 degrees   Status On-going   PT LONG TERM GOAL #2   Title Patient will demosntrate increased hamstring/quadriceps strength of 5/5 to be able to ambualte with notmalized gait speed >1.2 m/s to be able to ambulate at community gait speed.    Status On-going   PT LONG TERM GOAL #3   Title Patient will be able to ambualte without assistive device >65minutes to be able to perform grocery shopping withtou assistance withotu pain.    PT LONG TERM GOAL #4   Title Patint will be indpeendent with advanced HEP.                Plan - 05/17/15 1612    Clinical Impression Statement Pt continue to be limited in flexion due to swelling.  Pt is to bring in her compression stocking if they are not TED hose instruct how to properly DON.  If they are explain the difference between TED and compression to see if pt would be willing to wear thigh highcompression stockings.   Major deficits at this time is balance and ROM due to swelling.;   PT Next Visit Plan Concentrate on balance and swelling.         Problem List Patient Active Problem List   Diagnosis Date Noted  . Hematochezia 04/02/2015  . Rectal bleeding 04/02/2015  . Anemia 04/02/2015  . Bronchitis, chronic obstructive   . Arthritis   . Hypertension   . Dysrhythmia   . Obesity   . Essential hypertension   . Status post total left knee replacement 03/15/2015  . Myeloproliferative disorder, JAK-2 positive  01/05/2014  . Thrombocytosis 12/19/2013  . CMC arthritis, thumb, degenerative 06/03/2013  . Bone, giant cell tumor 06/03/2013  . GERD (gastroesophageal reflux disease) 11/19/2012  . Hiatal hernia 11/19/2012  . Acute respiratory failure 09/13/2011  . Bilateral pulmonary infiltrates on chest x-ray 09/13/2011  . Leukocytosis 09/13/2011  . Hyperglycemia 09/13/2011  . Endometrial cancer 09/12/2011  . Chronic lung disease 08/16/2011    Class: Chronic  . Obesity, morbid (more than 100 lbs over ideal weight or BMI > 40) 08/07/2011  . KNEE, ARTHRITIS, DEGEN./OSTEO 05/24/2010     Rayetta Humphrey, PT CLT [phone] 05/17/2015, 4:16 PM  [facility] [address] [place], [place], [address] Phone: [phone]   Fax:  [phone]

## 2015-05-19 ENCOUNTER — Encounter (HOSPITAL_COMMUNITY): Payer: Medicare Other | Admitting: Physical Therapy

## 2015-05-19 ENCOUNTER — Ambulatory Visit (HOSPITAL_COMMUNITY): Payer: Medicare Other | Admitting: Physical Therapy

## 2015-05-24 ENCOUNTER — Ambulatory Visit (HOSPITAL_COMMUNITY): Payer: Medicare Other | Admitting: Physical Therapy

## 2015-05-24 DIAGNOSIS — M25562 Pain in left knee: Secondary | ICD-10-CM | POA: Diagnosis not present

## 2015-05-24 DIAGNOSIS — R6889 Other general symptoms and signs: Secondary | ICD-10-CM

## 2015-05-24 DIAGNOSIS — R262 Difficulty in walking, not elsewhere classified: Secondary | ICD-10-CM

## 2015-05-24 DIAGNOSIS — R29898 Other symptoms and signs involving the musculoskeletal system: Secondary | ICD-10-CM | POA: Diagnosis not present

## 2015-05-24 DIAGNOSIS — M25662 Stiffness of left knee, not elsewhere classified: Secondary | ICD-10-CM | POA: Diagnosis not present

## 2015-05-24 NOTE — Therapy (Signed)
[place], [place], [address] Phone: [phone]   Fax:  [phone]  Physical Therapy Treatment  Patient Details  Name: [name] MRN: [id number] Date of Birth: Jan 13, 1941 Referring Provider:  [name], MD  Encounter Date: 05/24/2015      PT End of Session - 05/24/15 1808    Visit Number 12   Number of Visits 16   Authorization Type Medicare; Gcode complete 9th session   Authorization - Visit Number 12   Authorization - Number of Visits 19   PT Start Time 1520   PT Stop Time 1600   PT Time Calculation (min) 40 min   Activity Tolerance Patient tolerated treatment well   Behavior During Therapy [facility] for tasks assessed/performed      Past Medical History  Diagnosis Date  . Arthritis   . Hypertension   . GERD (gastroesophageal reflux disease)   . Obesity, morbid (more than 100 lbs over ideal weight or BMI > 40) 08/2011    Ht 5'7", wt 280 lb  . Bronchitis, chronic obstructive since 2000's  . Shortness of breath     unable to lay flat due to dyspnea  . Hiatal hernia   . Hiatal hernia 11/19/2012  . Cancer     dx with endometrial cancer   . Heart murmur   . Myeloproliferative disorder, JAK-2 positive 01/05/2014  . Polycythemia vera(238.4) 01/18/2014  . Gout   . Pneumonia 09/2014  . Dysrhythmia   . PONV (postoperative nausea and vomiting)     post-op hypoxic resp failure 09/2011 and require Bi-Pap (possible r/t - pressure pulm edema vs asp PNA, COPD exacerbation, obesity hypoventilation  . Anemia 04/02/2015  . Obesity     Past Surgical History  Procedure Laterality Date  . Hysteroscopy w/d&c  08/15/2011    Procedure: DILATATION AND CURETTAGE (D&C) /HYSTEROSCOPY;  Surgeon: Jonnie Kind, MD;  Location: [facility];  Service: Gynecology;  Laterality: N/A;  With Suction Curette  . Abdominal hysterectomy  09/12/2011    Procedure: HYSTERECTOMY ABDOMINAL;  Surgeon: Janie Morning, MD;  Location: [facility];   Service: Gynecology;  Laterality: N/A;  Total Abdominal Hysterectomy, Bilateral Salpingo Oophorectomy  . Salpingoophorectomy  09/12/2011    Procedure: SALPINGO OOPHERECTOMY;  Surgeon: Janie Morning, MD;  Location: [facility];  Service: Gynecology;  Laterality: Bilateral;  . Colonoscopy  07/05/2012    Procedure: COLONOSCOPY;  Surgeon: Rogene Houston, MD;  [facility];  Service: Endoscopy;  Laterality: N/A;  730  . Knee arthroplasty Left 03/15/2015    Procedure: COMPUTER ASSISTED TOTAL KNEE ARTHROPLASTY;  Surgeon: Marybelle Killings, MD;  Location: [place];  Service: Orthopedics;  Laterality: Left;  . Flexible sigmoidoscopy N/A 04/03/2015    Procedure: FLEXIBLE SIGMOIDOSCOPY;  Surgeon: Rogene Houston, MD;  [facility];  Service: Endoscopy;  Laterality: N/A;    There were no vitals filed for this visit.  Visit Diagnosis:  Knee stiffness, left  Decreased functional activity tolerance  Weakness of left leg  Difficulty walking  Left knee pain      Subjective Assessment - 05/24/15 1805    Subjective Pt states she looked for her hose but couldnt find them.  States they were white (TEDS).  States her back is hurting her today.                         [facility] Adult PT Treatment/Exercise - 05/24/15 1527  Knee/Hip Exercises: Stretches   Active Hamstring Stretch Left;3 reps;30 seconds   Active Hamstring Stretch Limitations 14"   Knee: Self-Stretch to increase Flexion Left   Knee: Self-Stretch Limitations 10x on 14 in step   Gastroc Stretch 3 reps;30 seconds   Gastroc Stretch Limitations slantboard   Knee/Hip Exercises: Standing   Heel Raises Both;15 reps   Functional Squat 10 reps   Knee/Hip Exercises: Supine   Quad Sets 10 reps   Short Arc Quad Sets 5 reps   Manual Therapy   Manual Therapy Edema management;Myofascial release   Edema Management Retro massage wtih LE elevated    Myofascial Release MFR to scar tissue to reduce adhesions and improve ROM                   PT Short Term Goals - 04/28/15 1020    PT SHORT TERM GOAL #1   Title Patint will demonstrate increased knee extension to 0 degrees to  be able to ambulate with a normlized stride length.    Baseline 04/28/2015 AROM 4-117 degrees   PT SHORT TERM GOAL #2   Title Patient will be able to demonstrate knee flexion > 100 degrees to be able to sit to chair withtou compensating through uninvolved LE.    Baseline 04/28/2015 AROM 4-117 degrees   Status Achieved   PT SHORT TERM GOAL #3   Title Patient will be able to dmeonstrate increased standing tolerance to >13minutes to perform stanidng exrcises in therpay.    Baseline 04/28/2015 Able to stand 10-15 minutes without HHA/ 15-20 minutes with HHA   Status On-going   PT SHORT TERM GOAL #4   Title Patinet will be able to ambualte with a SPC >60mintues to be able to ambualte short distances in and outside of home.    Baseline 04/28/2015: Reports ability to walk with cane inside house and drive way for 20 minutes comfortably   Status Achieved           PT Long Term Goals - 04/28/15 1044    PT LONG TERM GOAL #1   Title Patient will dmeosntrate increased knee flexon of 120 degrees to be able to squat to a low chair on lt withtou compendsating through Rt LE.    Baseline 04/28/2015 AROM 4-117 degrees   Status On-going   PT LONG TERM GOAL #2   Title Patient will demosntrate increased hamstring/quadriceps strength of 5/5 to be able to ambualte with notmalized gait speed >1.2 m/s to be able to ambulate at community gait speed.    Status On-going   PT LONG TERM GOAL #3   Title Patient will be able to ambualte without assistive device >45minutes to be able to perform grocery shopping withtou assistance withotu pain.    PT LONG TERM GOAL #4   Title Patint will be indpeendent with advanced HEP.                Plan - 05/24/15 1808    Clinical Impression Statement PT with swelling/induration in Lt LE.  Discussed sending  order to MD for compression hose and patient agreed.  Order faxed to Dr. Willey Blade.  Noted improvement in edema at end of session with overall improvment in quality of gait.    PT Next Visit Plan Give patient order for stockings when received.  Progress balance as able.         Problem List Patient Active Problem List   Diagnosis Date Noted  . Hematochezia 04/02/2015  . Rectal  bleeding 04/02/2015  . Anemia 04/02/2015  . Bronchitis, chronic obstructive   . Arthritis   . Hypertension   . Dysrhythmia   . Obesity   . Essential hypertension   . Status post total left knee replacement 03/15/2015  . Myeloproliferative disorder, JAK-2 positive 01/05/2014  . Thrombocytosis 12/19/2013  . CMC arthritis, thumb, degenerative 06/03/2013  . Bone, giant cell tumor 06/03/2013  . GERD (gastroesophageal reflux disease) 11/19/2012  . Hiatal hernia 11/19/2012  . Acute respiratory failure 09/13/2011  . Bilateral pulmonary infiltrates on chest x-ray 09/13/2011  . Leukocytosis 09/13/2011  . Hyperglycemia 09/13/2011  . Endometrial cancer 09/12/2011  . Chronic lung disease 08/16/2011    Class: Chronic  . Obesity, morbid (more than 100 lbs over ideal weight or BMI > 40) 08/07/2011  . KNEE, ARTHRITIS, DEGEN./OSTEO 05/24/2010    Teena Irani, PTA/CLT [phone]  05/24/2015, 6:12 PM  [facility] [address] [place], [place], [address] Phone: [phone]   Fax:  [phone]

## 2015-05-26 ENCOUNTER — Ambulatory Visit (HOSPITAL_COMMUNITY): Payer: Medicare Other | Admitting: Physical Therapy

## 2015-05-26 ENCOUNTER — Other Ambulatory Visit (HOSPITAL_COMMUNITY): Payer: Self-pay | Admitting: Oncology

## 2015-05-26 DIAGNOSIS — M25562 Pain in left knee: Secondary | ICD-10-CM | POA: Diagnosis not present

## 2015-05-26 DIAGNOSIS — R262 Difficulty in walking, not elsewhere classified: Secondary | ICD-10-CM

## 2015-05-26 DIAGNOSIS — R29898 Other symptoms and signs involving the musculoskeletal system: Secondary | ICD-10-CM | POA: Diagnosis not present

## 2015-05-26 DIAGNOSIS — R6889 Other general symptoms and signs: Secondary | ICD-10-CM | POA: Diagnosis not present

## 2015-05-26 DIAGNOSIS — M25662 Stiffness of left knee, not elsewhere classified: Secondary | ICD-10-CM

## 2015-05-26 NOTE — Therapy (Signed)
[place], [place], [address] Phone: [phone]   Fax:  [phone]  Physical Therapy Treatment  Patient Details  Name: [name] MRN: [id number] Date of Birth: Sep 28, 1941 Referring Provider:  [name], MD  Encounter Date: 05/26/2015      PT End of Session - 05/26/15 1654    Visit Number 13   Number of Visits 16   Date for PT Re-Evaluation 05/31/15   Authorization Type Medicare; Gcode complete 9th session   Authorization Time Period 03/31/15 - 05/31/15   Authorization - Visit Number 13   Authorization - Number of Visits 19   PT Start Time 1602   PT Stop Time 1642   PT Time Calculation (min) 40 min   Equipment Utilized During Treatment Gait belt   Activity Tolerance Patient tolerated treatment well   Behavior During Therapy [facility] for tasks assessed/performed      Past Medical History  Diagnosis Date  . Arthritis   . Hypertension   . GERD (gastroesophageal reflux disease)   . Obesity, morbid (more than 100 lbs over ideal weight or BMI > 40) 08/2011    Ht 5'7", wt 280 lb  . Bronchitis, chronic obstructive since 2000's  . Shortness of breath     unable to lay flat due to dyspnea  . Hiatal hernia   . Hiatal hernia 11/19/2012  . Cancer     dx with endometrial cancer   . Heart murmur   . Myeloproliferative disorder, JAK-2 positive 01/05/2014  . Polycythemia vera(238.4) 01/18/2014  . Gout   . Pneumonia 09/2014  . Dysrhythmia   . PONV (postoperative nausea and vomiting)     post-op hypoxic resp failure 09/2011 and require Bi-Pap (possible r/t - pressure pulm edema vs asp PNA, COPD exacerbation, obesity hypoventilation  . Anemia 04/02/2015  . Obesity     Past Surgical History  Procedure Laterality Date  . Hysteroscopy w/d&c  08/15/2011    Procedure: DILATATION AND CURETTAGE (D&C) /HYSTEROSCOPY;  Surgeon: Jonnie Kind, MD;  Location: [facility];  Service: Gynecology;  Laterality: N/A;  With Suction  Curette  . Abdominal hysterectomy  09/12/2011    Procedure: HYSTERECTOMY ABDOMINAL;  Surgeon: Janie Morning, MD;  [facility];  Service: Gynecology;  Laterality: N/A;  Total Abdominal Hysterectomy, Bilateral Salpingo Oophorectomy  . Salpingoophorectomy  09/12/2011    Procedure: SALPINGO OOPHERECTOMY;  Surgeon: Janie Morning, MD;  [facility];  Service: Gynecology;  Laterality: Bilateral;  . Colonoscopy  07/05/2012    Procedure: COLONOSCOPY;  Surgeon: Rogene Houston, MD;  [facility];  Service: Endoscopy;  Laterality: N/A;  730  . Knee arthroplasty Left 03/15/2015    Procedure: COMPUTER ASSISTED TOTAL KNEE ARTHROPLASTY;  Surgeon: Marybelle Killings, MD;  Location: [place];  Service: Orthopedics;  Laterality: Left;  . Flexible sigmoidoscopy N/A 04/03/2015    Procedure: FLEXIBLE SIGMOIDOSCOPY;  Surgeon: Rogene Houston, MD;  [facility];  Service: Endoscopy;  Laterality: N/A;    There were no vitals filed for this visit.  Visit Diagnosis:  Knee stiffness, left  Decreased functional activity tolerance  Weakness of left leg  Difficulty walking  Left knee pain      Subjective Assessment - 05/26/15 1603    Subjective Patient reports she is doing well today, no pain in knee but she is having a headache today    Pertinent History opatient had Lt TKA 03/15/15, patient states " had always had knee  pain fbores that, cortizone shots helped, but became decreasingly helpful with most recent shot providign no relief. Patient notes Rt knee pain as well resulting in cortizon shots into Rt knee. Patient was at [facility] center where she had 3 PT sessions a week toincrease strength noting imrpoved strength.  Patient states "I do not want to do stairs due to Rt knee pain".  patient owns a 4 pronged cane.    Currently in Pain? No/denies                         [facility] Adult PT Treatment/Exercise - 05/26/15 0001    Knee/Hip Exercises: Stretches   Active Hamstring Stretch  Both   Active Hamstring Stretch Limitations 12 inch box    Knee: Self-Stretch to increase Flexion --   Knee: Self-Stretch Limitations --   Gastroc Stretch Limitations unable to tolerate today due to calf pain    Manual Therapy   Manual Therapy Edema management   Edema Management Retro massage wtih LE elevated              Balance Exercises - 05/26/15 1607    Balance Exercises: Standing   Standing Eyes Closed Narrow base of support (BOS);Foam/compliant surface;3 reps;20 secs   Tandem Stance Eyes closed;Foam/compliant surface;3 reps;15 secs   Rockerboard Anterior/posterior;Lateral;Other (comment)  x20 lateral, x20AP U HHA    Sidestepping Other (comment)  hip ABD walks 2x42ft   Heel Raises Limitations 1x15 at parallel bars    Other Standing Exercises Tandem gait 2x58ft,            PT Education - 05/26/15 1653    Education provided Yes   Education Details educated on prescription for compression stockings    Person(s) Educated Patient   Methods Explanation   Comprehension Verbalized understanding          PT Short Term Goals - 04/28/15 1020    PT SHORT TERM GOAL #1   Title Patint will demonstrate increased knee extension to 0 degrees to  be able to ambulate with a normlized stride length.    Baseline 04/28/2015 AROM 4-117 degrees   PT SHORT TERM GOAL #2   Title Patient will be able to demonstrate knee flexion > 100 degrees to be able to sit to chair withtou compensating through uninvolved LE.    Baseline 04/28/2015 AROM 4-117 degrees   Status Achieved   PT SHORT TERM GOAL #3   Title Patient will be able to dmeonstrate increased standing tolerance to >76minutes to perform stanidng exrcises in therpay.    Baseline 04/28/2015 Able to stand 10-15 minutes without HHA/ 15-20 minutes with HHA   Status On-going   PT SHORT TERM GOAL #4   Title Patinet will be able to ambualte with a SPC >36mintues to be able to ambualte short distances in and outside of home.    Baseline  04/28/2015: Reports ability to walk with cane inside house and drive way for 20 minutes comfortably   Status Achieved           PT Long Term Goals - 04/28/15 1044    PT LONG TERM GOAL #1   Title Patient will dmeosntrate increased knee flexon of 120 degrees to be able to squat to a low chair on lt withtou compendsating through Rt LE.    Baseline 04/28/2015 AROM 4-117 degrees   Status On-going   PT LONG TERM GOAL #2   Title Patient will demosntrate increased hamstring/quadriceps strength of 5/5 to  be able to ambualte with notmalized gait speed >1.2 m/s to be able to ambulate at community gait speed.    Status On-going   PT LONG TERM GOAL #3   Title Patient will be able to ambualte without assistive device >51minutes to be able to perform grocery shopping withtou assistance withotu pain.    PT LONG TERM GOAL #4   Title Patint will be indpeendent with advanced HEP.                Plan - 05/26/15 1655    Clinical Impression Statement Focused on balance and edema management today. Patient does demonstrate significant difficulty with balance tasks such as tandem gait and tasks with her eyes closed on foam, required Min(A) to maintain safety and balance today. Patient continues to have wound on distal portion of her incision that continues to appear to be non-healing; no drainage but at this time measured at approximately  0.4cm long by 0.2cm wide. Pateint educated regarding cmpression stockings prescription however left at end of session without it- will be given prescription next session.    Pt will benefit from skilled therapeutic intervention in order to improve on the following deficits Abnormal gait;Decreased strength;Difficulty walking;Impaired flexibility;Pain;Hypermobility   Rehab Potential Good   PT Frequency 2x / week   PT Duration 8 weeks   PT Treatment/Interventions Manual techniques;Therapeutic activities;Therapeutic exercise;Patient/family education   PT Next Visit Plan  Re-assess, give compression stocking prescription    PT Home Exercise Plan Add standing exercises when appropriate, ensure safe technique   Consulted and Agree with Plan of Care Patient        Problem List Patient Active Problem List   Diagnosis Date Noted  . Hematochezia 04/02/2015  . Rectal bleeding 04/02/2015  . Anemia 04/02/2015  . Bronchitis, chronic obstructive   . Arthritis   . Hypertension   . Dysrhythmia   . Obesity   . Essential hypertension   . Status post total left knee replacement 03/15/2015  . Myeloproliferative disorder, JAK-2 positive 01/05/2014  . Thrombocytosis 12/19/2013  . CMC arthritis, thumb, degenerative 06/03/2013  . Bone, giant cell tumor 06/03/2013  . GERD (gastroesophageal reflux disease) 11/19/2012  . Hiatal hernia 11/19/2012  . Acute respiratory failure 09/13/2011  . Bilateral pulmonary infiltrates on chest x-ray 09/13/2011  . Leukocytosis 09/13/2011  . Hyperglycemia 09/13/2011  . Endometrial cancer 09/12/2011  . Chronic lung disease 08/16/2011    Class: Chronic  . Obesity, morbid (more than 100 lbs over ideal weight or BMI > 40) 08/07/2011  . KNEE, ARTHRITIS, DEGEN./OSTEO 05/24/2010    Deniece Ree PT, DPT [place] [address] [place], [place], [address] Phone: [phone]   Fax:  [phone]

## 2015-05-30 DIAGNOSIS — Z96652 Presence of left artificial knee joint: Secondary | ICD-10-CM | POA: Diagnosis not present

## 2015-05-31 ENCOUNTER — Ambulatory Visit (HOSPITAL_COMMUNITY): Payer: Medicare Other | Admitting: Physical Therapy

## 2015-05-31 DIAGNOSIS — R29898 Other symptoms and signs involving the musculoskeletal system: Secondary | ICD-10-CM | POA: Diagnosis not present

## 2015-05-31 DIAGNOSIS — M25662 Stiffness of left knee, not elsewhere classified: Secondary | ICD-10-CM

## 2015-05-31 DIAGNOSIS — R6889 Other general symptoms and signs: Secondary | ICD-10-CM

## 2015-05-31 DIAGNOSIS — M25562 Pain in left knee: Secondary | ICD-10-CM

## 2015-05-31 DIAGNOSIS — R262 Difficulty in walking, not elsewhere classified: Secondary | ICD-10-CM

## 2015-05-31 NOTE — Therapy (Signed)
[place], [place], [address] Phone: [phone]   Fax:  [phone]  Physical Therapy Treatment (Re-Assessment)  Patient Details  Name: [name] MRN: [id number] Date of Birth: [birth year] Referring Provider:  [name], MD  Encounter Date: 05/31/2015      PT End of Session - 05/31/15 1558    Visit Number 14   Number of Visits 22   Date for PT Re-Evaluation 06/28/15   Authorization Type Medicare; Gcode complete 14th session   Authorization Time Period 03/31/15 - 05/31/15; 06/01/15 to 08/02/15   Authorization - Visit Number 14   Authorization - Number of Visits 24   PT Start Time 8938   PT Stop Time 1553   PT Time Calculation (min) 38 min   Activity Tolerance Patient tolerated treatment well   Behavior During Therapy [facility] for tasks assessed/performed      Past Medical History  Diagnosis Date  . Arthritis   . Hypertension   . GERD (gastroesophageal reflux disease)   . Obesity, morbid (more than 100 lbs over ideal weight or BMI > 40) 08/2011    Ht 5'7", wt 280 lb  . Bronchitis, chronic obstructive since 2000's  . Shortness of breath     unable to lay flat due to dyspnea  . Hiatal hernia   . Hiatal hernia 11/19/2012  . Cancer     dx with endometrial cancer   . Heart murmur   . Myeloproliferative disorder, JAK-2 positive 01/05/2014  . Polycythemia vera(238.4) 01/18/2014  . Gout   . Pneumonia 09/2014  . Dysrhythmia   . PONV (postoperative nausea and vomiting)     post-op hypoxic resp failure 09/2011 and require Bi-Pap (possible r/t - pressure pulm edema vs asp PNA, COPD exacerbation, obesity hypoventilation  . Anemia 04/02/2015  . Obesity     Past Surgical History  Procedure Laterality Date  . Hysteroscopy w/d&c  08/15/2011    Procedure: DILATATION AND CURETTAGE (D&C) /HYSTEROSCOPY;  Surgeon: Jonnie Kind, MD;  Location: [facility];  Service: Gynecology;  Laterality: N/A;  With Suction Curette  .  Abdominal hysterectomy  09/12/2011    Procedure: HYSTERECTOMY ABDOMINAL;  Surgeon: Janie Morning, MD;  Location: [facility];  Service: Gynecology;  Laterality: N/A;  Total Abdominal Hysterectomy, Bilateral Salpingo Oophorectomy  . Salpingoophorectomy  09/12/2011    Procedure: SALPINGO OOPHERECTOMY;  Surgeon: Janie Morning, MD;  Location: [facility];  Service: Gynecology;  Laterality: Bilateral;  . Colonoscopy  07/05/2012    Procedure: COLONOSCOPY;  Surgeon: Rogene Houston, MD;  [facility];  Service: Endoscopy;  Laterality: N/A;  730  . Knee arthroplasty Left 03/15/2015    Procedure: COMPUTER ASSISTED TOTAL KNEE ARTHROPLASTY;  Surgeon: Marybelle Killings, MD;  Location: [place];  Service: Orthopedics;  Laterality: Left;  . Flexible sigmoidoscopy N/A 04/03/2015    Procedure: FLEXIBLE SIGMOIDOSCOPY;  Surgeon: Rogene Houston, MD;  [facility];  Service: Endoscopy;  Laterality: N/A;    There were no vitals filed for this visit.  Visit Diagnosis:  Knee stiffness, left - Plan: PT plan of care cert/re-cert  Decreased functional activity tolerance - Plan: PT plan of care cert/re-cert  Weakness of left leg - Plan: PT plan of care cert/re-cert  Difficulty walking - Plan: PT plan of care cert/re-cert  Left knee pain - Plan: PT plan of care cert/re-cert      Subjective Assessment - 05/31/15 1517    Subjective Patient reports taht she  is doing OK today, no pain but really did not do taht much this weekend    Pertinent History opatient had Lt TKA 03/15/15, patient states " had always had knee pain fbores that, cortizone shots helped, but became decreasingly helpful with most recent shot providign no relief. Patient notes Rt knee pain as well resulting in cortizon shots into Rt knee. Patient was at [facility] center where she had 3 PT sessions a week toincrease strength noting imrpoved strength.  Patient states "I do not want to do stairs due to Rt knee pain".  patient owns a 4 pronged cane.    How long  can you sit comfortably? 7/25- 60 minutes but does not prefer to sit this long    How long can you stand comfortably? 7/25- 30 minutes but more limited by back than knee    How long can you walk comfortably? 7/25- uses walker or cane, still around 30 minutes    Patient Stated Goals Patient enjoys traveling and going to the grand kids ball games. to be able to walk withotu an assistive device.    Currently in Pain? No/denies            [facility] PT Assessment - 05/31/15 0001    Assessment   Medical Diagnosis Lt TKA   Onset Date/Surgical Date 03/15/15   Next MD Visit August with Dr. 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    Observation/Other Assessments   Focus on Therapeutic Outcomes (FOTO)  25% limited    AROM   Right Knee Extension 3   Right Knee Flexion 117   Strength   Right Hip Flexion 4/5   Left Hip Flexion 4-/5   Right Knee Flexion 4/5   Right Knee Extension 4/5   Left Knee Flexion 4/5   Left Knee Extension 4+/5   Right Ankle Dorsiflexion 5/5   Left Ankle Dorsiflexion 5/5                     [facility] Adult PT Treatment/Exercise - 05/31/15 0001    Ambulation/Gait   Gait Comments 312ft on 3 minute walk test, 0.24m/s    Knee/Hip Exercises: Stretches   Active Hamstring Stretch Both;3 reps;30 seconds   Active Hamstring Stretch Limitations 12 inch box    Gastroc Stretch 3 reps;30 seconds   Gastroc Stretch Limitations slantboard                 PT Education - 05/31/15 1735    Education provided Yes   Education Details plan of care moving forward    Person(s) Educated Patient   Methods Explanation   Comprehension Verbalized understanding          PT Short Term Goals - 05/31/15 1540    PT SHORT TERM GOAL #1   Title Patint will demonstrate increased knee extension to 0 degrees to   be able to ambulate with a normlized stride length.    Time 4   Period Weeks   Status On-going   PT SHORT TERM GOAL #2   Title Patient will be able to demonstrate knee flexion > 100 degrees to be able to sit to chair withtou compensating through uninvolved LE.    Time 4   Period Weeks   Status Achieved   PT SHORT TERM GOAL #3   Title Patient will be able to  dmeonstrate increased standing tolerance to >30minutes to perform stanidng exrcises in therpay.    Baseline June 24, 2023- able to stand at least 30 but limited by back more than knee    Time 4   Period Weeks   Status Achieved   PT SHORT TERM GOAL #4   Title Patinet will be able to ambualte with a SPC >64mintues to be able to ambualte short distances in and outside of home.    Time 4   Period Weeks   Status Achieved           PT Long Term Goals - 06/24/15 1542    PT LONG TERM GOAL #1   Title Patient will dmeosntrate increased knee flexon of 120 degrees to be able to squat to a low chair on lt withtou compendsating through Rt LE.    Time 8   Period Weeks   Status On-going   PT LONG TERM GOAL #2   Title Patient will demosntrate increased hamstring/quadriceps strength of 5/5 to be able to ambualte with notmalized gait speed >1.2 m/s to be able to ambulate at community gait speed.    Time 8   Period Weeks   Status On-going   PT LONG TERM GOAL #3   Title Patient will be able to ambualte without assistive device >35minutes to be able to perform grocery shopping withtou assistance withotu pain.    Time 8   Period Weeks   Status On-going   PT LONG TERM GOAL #4   Title Patint will be indpeendent with advanced HEP.    Time 8   Period Weeks   Status On-going               Plan - 24-Jun-2015 1559    Clinical Impression Statement Re-assessment performed today. Patient continues to demonstrate bilateral lower extremity weakness, impaired balance, reduced knee ROM, edema, reduced gait tolerance, reduced functional activity  tolerance, and reduced functional task performance skills. The  patient also continues to have a small  open sore at the distal end of her inicision which continues to be approximately 0.4cm long by approximately 0.2cm wide with yellow-green slough; wound referral request was sent to MD today. Patient was also given MD prescription for compression stockings today.  The patient will continue to benefit from skilled PT services at approximately 2x/week for 4 more weeks in order to address her deficits and assist her in reaching an optimal level of function.    Pt will benefit from skilled therapeutic intervention in order to improve on the following deficits Abnormal gait;Decreased strength;Difficulty walking;Impaired flexibility;Pain;Hypermobility   Rehab Potential Good   PT Frequency 2x / week   PT Duration 4 weeks   PT Treatment/Interventions Manual techniques;Therapeutic activities;Therapeutic exercise;Patient/family education   PT Next Visit Plan Follow up to see if patient has obtained compression stockings; functional balance and gait and functional activity tolerance skills. Follow up on wound referral.    PT Home Exercise Plan Add standing exercises when appropriate, ensure safe technique   Consulted and Agree with Plan of Care Patient          G-Codes - 06/24/2015 1740    Functional Assessment Tool Used FOTO 25% limitation    Functional Limitation Mobility: Walking and moving around   Mobility: Walking and Moving Around Current Status (U6333) At least 20 percent but less than 40 percent impaired, limited or restricted   Mobility: Walking and Moving Around Goal Status (L4562) At least 1 percent but less than 20 percent impaired,  limited or restricted      Problem List Patient Active Problem List   Diagnosis Date Noted  . Hematochezia 04/02/2015  . Rectal bleeding 04/02/2015  . Anemia 04/02/2015  . Bronchitis, chronic obstructive   . Arthritis   . Hypertension   . Dysrhythmia   .  Obesity   . Essential hypertension   . Status post total left knee replacement 03/15/2015  . Myeloproliferative disorder, JAK-2 positive 01/05/2014  . Thrombocytosis 12/19/2013  . CMC arthritis, thumb, degenerative 06/03/2013  . Bone, giant cell tumor 06/03/2013  . GERD (gastroesophageal reflux disease) 11/19/2012  . Hiatal hernia 11/19/2012  . Acute respiratory failure 09/13/2011  . Bilateral pulmonary infiltrates on chest x-ray 09/13/2011  . Leukocytosis 09/13/2011  . Hyperglycemia 09/13/2011  . Endometrial cancer 09/12/2011  . Chronic lung disease 08/16/2011    Class: Chronic  . Obesity, morbid (more than 100 lbs over ideal weight or BMI > 40) 08/07/2011  . KNEE, ARTHRITIS, DEGEN./OSTEO 05/24/2010    Physical Therapy Progress Note  Dates of Reporting Period: 05/03/15 to 05/31/15  Objective Reports of Subjective Statement: see above   Objective Measurements: see above   Goal Update: see above   Plan: see above   Reason Skilled Services are Required: reduced knee ROM and strength, reduced gait speed, reduced balance, possible wound care pending MD approval    Deniece Ree PT, DPT [place], [place], [address] Phone: [phone]   Fax:  [phone]

## 2015-06-02 ENCOUNTER — Ambulatory Visit (HOSPITAL_COMMUNITY): Payer: Medicare Other | Admitting: Physical Therapy

## 2015-06-02 ENCOUNTER — Telehealth (HOSPITAL_COMMUNITY): Payer: Self-pay | Admitting: Physical Therapy

## 2015-06-02 NOTE — Telephone Encounter (Signed)
Patient came by and has decided to see her surgeon about the wound and will not except treatment for the wound at this time. She wants the referral for TKR to remain open until she see the MD.on 06/10/2015.

## 2015-06-03 DIAGNOSIS — Z96652 Presence of left artificial knee joint: Secondary | ICD-10-CM | POA: Diagnosis not present

## 2015-06-09 ENCOUNTER — Encounter (INDEPENDENT_AMBULATORY_CARE_PROVIDER_SITE_OTHER): Payer: Self-pay | Admitting: *Deleted

## 2015-06-30 ENCOUNTER — Ambulatory Visit (HOSPITAL_COMMUNITY): Payer: Medicare Other | Admitting: Hematology & Oncology

## 2015-06-30 ENCOUNTER — Other Ambulatory Visit (HOSPITAL_COMMUNITY): Payer: Medicare Other

## 2015-06-30 DIAGNOSIS — Z96652 Presence of left artificial knee joint: Secondary | ICD-10-CM | POA: Diagnosis not present

## 2015-07-01 DIAGNOSIS — M545 Low back pain: Secondary | ICD-10-CM | POA: Diagnosis not present

## 2015-07-01 DIAGNOSIS — M170 Bilateral primary osteoarthritis of knee: Secondary | ICD-10-CM | POA: Diagnosis not present

## 2015-07-04 DIAGNOSIS — Z96652 Presence of left artificial knee joint: Secondary | ICD-10-CM | POA: Diagnosis not present

## 2015-07-31 DIAGNOSIS — Z96652 Presence of left artificial knee joint: Secondary | ICD-10-CM | POA: Diagnosis not present

## 2015-08-04 DIAGNOSIS — Z96652 Presence of left artificial knee joint: Secondary | ICD-10-CM | POA: Diagnosis not present

## 2015-08-09 ENCOUNTER — Encounter (HOSPITAL_COMMUNITY): Payer: Medicare Other

## 2015-08-09 ENCOUNTER — Encounter (HOSPITAL_COMMUNITY): Payer: Self-pay | Admitting: Hematology & Oncology

## 2015-08-09 ENCOUNTER — Other Ambulatory Visit (HOSPITAL_COMMUNITY): Payer: Self-pay | Admitting: Hematology & Oncology

## 2015-08-09 ENCOUNTER — Encounter (HOSPITAL_COMMUNITY): Payer: Medicare Other | Attending: Hematology & Oncology | Admitting: Hematology & Oncology

## 2015-08-09 VITALS — BP 162/65 | HR 67 | Temp 98.0°F | Resp 22 | Wt 268.0 lb

## 2015-08-09 DIAGNOSIS — D473 Essential (hemorrhagic) thrombocythemia: Secondary | ICD-10-CM

## 2015-08-09 DIAGNOSIS — Z1231 Encounter for screening mammogram for malignant neoplasm of breast: Secondary | ICD-10-CM

## 2015-08-09 DIAGNOSIS — D471 Chronic myeloproliferative disease: Secondary | ICD-10-CM

## 2015-08-09 DIAGNOSIS — D500 Iron deficiency anemia secondary to blood loss (chronic): Secondary | ICD-10-CM

## 2015-08-09 DIAGNOSIS — D72829 Elevated white blood cell count, unspecified: Secondary | ICD-10-CM

## 2015-08-09 DIAGNOSIS — D75839 Thrombocytosis, unspecified: Secondary | ICD-10-CM

## 2015-08-09 DIAGNOSIS — Z139 Encounter for screening, unspecified: Secondary | ICD-10-CM | POA: Diagnosis not present

## 2015-08-09 LAB — CBC WITH DIFFERENTIAL/PLATELET
Basophils Absolute: 0 10*3/uL (ref 0.0–0.1)
Basophils Relative: 1 %
EOS ABS: 0.1 10*3/uL (ref 0.0–0.7)
Eosinophils Relative: 2 %
HEMATOCRIT: 38.3 % (ref 36.0–46.0)
HEMOGLOBIN: 12.8 g/dL (ref 12.0–15.0)
LYMPHS ABS: 1.9 10*3/uL (ref 0.7–4.0)
Lymphocytes Relative: 33 %
MCH: 37 pg — AB (ref 26.0–34.0)
MCHC: 33.4 g/dL (ref 30.0–36.0)
MCV: 110.7 fL — ABNORMAL HIGH (ref 78.0–100.0)
MONOS PCT: 8 %
Monocytes Absolute: 0.5 10*3/uL (ref 0.1–1.0)
NEUTROS ABS: 3.2 10*3/uL (ref 1.7–7.7)
Neutrophils Relative %: 56 %
Platelets: 224 10*3/uL (ref 150–400)
RBC: 3.46 MIL/uL — ABNORMAL LOW (ref 3.87–5.11)
RDW: 14.9 % (ref 11.5–15.5)
WBC: 5.7 10*3/uL (ref 4.0–10.5)

## 2015-08-09 LAB — COMPREHENSIVE METABOLIC PANEL
ALBUMIN: 3.7 g/dL (ref 3.5–5.0)
ALK PHOS: 99 U/L (ref 38–126)
ALT: 17 U/L (ref 14–54)
AST: 21 U/L (ref 15–41)
Anion gap: 4 — ABNORMAL LOW (ref 5–15)
BILIRUBIN TOTAL: 0.7 mg/dL (ref 0.3–1.2)
BUN: 26 mg/dL — ABNORMAL HIGH (ref 6–20)
CALCIUM: 8.8 mg/dL — AB (ref 8.9–10.3)
CO2: 29 mmol/L (ref 22–32)
Chloride: 106 mmol/L (ref 101–111)
Creatinine, Ser: 0.72 mg/dL (ref 0.44–1.00)
GFR calc non Af Amer: >60 mL/min (ref >60)
Glucose, Bld: 105 mg/dL — ABNORMAL HIGH (ref 65–99)
Potassium: 3.9 mmol/L (ref 3.5–5.1)
Sodium: 139 mmol/L (ref 135–145)
TOTAL PROTEIN: 7.5 g/dL (ref 6.5–8.1)

## 2015-08-09 NOTE — Progress Notes (Signed)
[name], [place] [address]   DIAGNOSIS: JAK 2 positive MPD, Essential Thrombocytosis Acute blood loss anemia secondary to recent knee replacement 03/2015 R wrist 3 views on 06/03/2013 ordered by Dr. Aline Brochure with eccentric lesion in the metaphyseal epiphyseal region approximately 12 x 8 mm Ddx includes giant cell tumor, benign bone lesion  CURRENT THERAPY: Hydrea  INTERVAL HISTORY: [name] 74 y.o. female returns for follow up of JAK 2 positive Essential Thrombocytosis.  [name] is here today with her husband. She was admitted to the hospital with lower GI bleed in May.  She hasn't had a mammogram since 2014. She says she had a lump evaluated in her breast last time, which was "just fluid."  She says she's doing well when it comes to her knee. She says she had a rough time with it, but it's better now. She's been up and walking and independent again. She notes that sometimes when she's walking, the top of her foot "wants to buckle and throws her off balance;" so she usually takes her cane when she's walking somewhere outside of the house. At the house, she says she's usually around something she can grab, so she doesn't need the cane.  With regards to her weight, she says "honey, from my weight, I don't have no problem with eating."  She denies any new pain.  She is still taking her hydrea. She has had some confusion with her prescription refills, and was advised to call next time she is in doubt about her prescription.  She denies any strange bleeding (nose bleeding, gum bleeding, etc.) and denies any rectal bleeding. She is scheduled to see Dr. Laural Golden sometime this month.    MEDICAL HISTORY: Past Medical History  Diagnosis Date  . Arthritis   . Hypertension   . GERD (gastroesophageal reflux disease)   . Obesity, morbid (more than 100 lbs over ideal weight or BMI > 40) (HCC) 08/2011    Ht 5'7", wt 280 lb  . Bronchitis, chronic  obstructive ([place]) since 2000's  . Shortness of breath     unable to lay flat due to dyspnea  . Hiatal hernia   . Hiatal hernia 11/19/2012  . Cancer [facility])     dx with endometrial cancer   . Heart murmur   . Myeloproliferative disorder, JAK-2 positive 01/05/2014  . Polycythemia vera(238.4) 01/18/2014  . Gout   . Pneumonia 09/2014  . Dysrhythmia   . PONV (postoperative nausea and vomiting)     post-op hypoxic resp failure 09/2011 and require Bi-Pap (possible r/t - pressure pulm edema vs asp PNA, COPD exacerbation, obesity hypoventilation  . Anemia 04/02/2015  . Obesity     has KNEE, ARTHRITIS, DEGEN./OSTEO; Chronic lung disease; Endometrial cancer ([place]); Acute respiratory failure ([place]); Bilateral pulmonary infiltrates on chest x-ray; Leukocytosis; Hyperglycemia; GERD (gastroesophageal reflux disease); Hiatal hernia; CMC arthritis, thumb, degenerative; Bone, giant cell tumor; Thrombocytosis ([place]); Myeloproliferative disorder, JAK-2 positive; Status post total left knee replacement; Hematochezia; Rectal bleeding; Bronchitis, chronic obstructive ([place]); Obesity, morbid (more than 100 lbs over ideal weight or BMI > 40) ([place]); Arthritis; Hypertension; Anemia; Dysrhythmia; Obesity; and Essential hypertension on her problem list.     is allergic to carbamazepine and sulfa antibiotics.  Current Outpatient Prescriptions on File Prior to Visit  Medication Sig Dispense Refill  . albuterol (PROVENTIL HFA;VENTOLIN HFA) 108 (90 BASE) MCG/ACT inhaler Inhale 2 puffs into the lungs every 6 (six) hours as needed for wheezing or  shortness of breath.    Marland Kitchen aspirin EC 325 MG EC tablet Take 1 tablet (325 mg total) by mouth daily with breakfast. 30 tablet 0  . atenolol (TENORMIN) 50 MG tablet Take 25 mg by mouth 2 (two) times daily.     . cloNIDine (CATAPRES) 0.3 MG tablet Take 0.15 mg by mouth 2 (two) times daily.     Marland Kitchen diltiazem (CARDIZEM CD) 180 MG 24 hr capsule Take 180 mg by mouth at bedtime.     . docusate sodium  (COLACE) 100 MG capsule Take 100 mg by mouth as needed.     Marland Kitchen esomeprazole (NEXIUM) 40 MG capsule Take 1 capsule (40 mg total) by mouth every morning. (Patient taking differently: Take 40 mg by mouth at bedtime. ) 30 capsule 11  . HYDROcodone-acetaminophen (NORCO) 10-325 MG per tablet Take 1-2 tablets by mouth every 6 (six) hours as needed (breakthrough pain). 60 tablet 0  . hydroxyurea (HYDREA) 500 MG capsule Take 2 capsules (1,000 mg total) by mouth daily. 60 capsule 3  . losartan (COZAAR) 100 MG tablet Take 100 mg by mouth daily.     . Simethicone (GAS RELIEF PO) Take 1 tablet by mouth as needed (gas).     . simvastatin (ZOCOR) 10 MG tablet Take 10 mg by mouth daily after supper.    . traZODone (DESYREL) 50 MG tablet Take 1 tablet by mouth at bedtime.    . ciprofloxacin (CIPRO) 500 MG tablet Take 1 tablet (500 mg total) by mouth 2 (two) times daily. (Patient not taking: Reported on 08/09/2015) 14 tablet 0  . metroNIDAZOLE (FLAGYL) 250 MG tablet Take 1 tablet (250 mg total) by mouth every 8 (eight) hours. (Patient not taking: Reported on 08/09/2015) 21 tablet 0   No current facility-administered medications on file prior to visit.    SURGICAL HISTORY: Past Surgical History  Procedure Laterality Date  . Hysteroscopy w/d&c  08/15/2011    Procedure: DILATATION AND CURETTAGE (D&C) /HYSTEROSCOPY;  Surgeon: Jonnie Kind, MD;  Location: [facility];  Service: Gynecology;  Laterality: N/A;  With Suction Curette  . Abdominal hysterectomy  09/12/2011    Procedure: HYSTERECTOMY ABDOMINAL;  Surgeon: Janie Morning, MD;  [facility];  Service: Gynecology;  Laterality: N/A;  Total Abdominal Hysterectomy, Bilateral Salpingo Oophorectomy  . Salpingoophorectomy  09/12/2011    Procedure: SALPINGO OOPHERECTOMY;  Surgeon: Janie Morning, MD;  Location[facility];  Service: Gynecology;  Laterality: Bilateral;  . Colonoscopy  07/05/2012    Procedure: COLONOSCOPY;  Surgeon: Rogene Houston, MD;  [facility];  Service: Endoscopy;  Laterality: N/A;  730  . Knee arthroplasty Left 03/15/2015    Procedure: COMPUTER ASSISTED TOTAL KNEE ARTHROPLASTY;  Surgeon: Marybelle Killings, MD;  Location: [place];  Service: Orthopedics;  Laterality: Left;  . Flexible sigmoidoscopy N/A 04/03/2015    Procedure: FLEXIBLE SIGMOIDOSCOPY;  Surgeon: Rogene Houston, MD;  [facility];  Service: Endoscopy;  Laterality: N/A;    SOCIAL HISTORY: Social History   Social History  . Marital Status: Married    Spouse Name: N/A  . Number of Children: N/A  . Years of Education: N/A   Occupational History  . Not on file.   Social History Main Topics  . Smoking status: Never Smoker   . Smokeless tobacco: Never Used  . Alcohol Use: No  . Drug Use: No  . Sexual Activity: No     Comment: hysterectomy   Other Topics Concern  . Not on  file   Social History Narrative  lives in [place]: Family History  Problem Relation Age of Onset  . Anesthesia problems Neg Hx   . Hypotension Neg Hx   . Malignant hyperthermia Neg Hx   . Pseudochol deficiency Neg Hx   . Other Mother   . Cancer Father   . Hypertension Son     Review of Systems  Constitutional: Positive for malaise/fatigue. Negative for fever, chills and weight loss.  HENT: Negative for hearing loss.   Eyes: Negative for blurred vision and double vision.  Respiratory: Negative for cough.   Cardiovascular: Negative for chest pain and palpitations.  Gastrointestinal: Negative for heartburn, nausea, vomiting, abdominal pain, diarrhea, constipation, blood in stool and melena.  Genitourinary: Negative for dysuria and hematuria.  Musculoskeletal: Positive for joint pain. Negative for myalgias.  Skin: Negative for rash.  Neurological:  Negative for dizziness, tingling, seizures, loss of consciousness and headaches.  Endo/Heme/Allergies: Negative.   Psychiatric/Behavioral: Negative for depression and suicidal ideas. The patient is not  nervous/anxious and does not have insomnia.    14 point review of systems was performed and is negative except as detailed under history of present illness and above   PHYSICAL EXAMINATION  ECOG PERFORMANCE STATUS: 1 - Symptomatic but completely ambulatory  Filed Vitals:   08/09/15 1435  BP: 162/65  Pulse: 67  Temp: 98 F (36.7 C)  Resp: 22    Physical Exam  Constitutional: She is oriented to person, place, and time and well-developed, well-nourished, and in no distress. Obese  Patient is ambulatory today, with no more knee trouble. Able to walk to the exam table HENT:  Head: Normocephalic and atraumatic.  Nose: Nose normal.  Mouth/Throat: Oropharynx is clear and moist. No oropharyngeal exudate.  Eyes: Conjunctivae and EOM are normal. Pupils are equal, round, and reactive to light. Right eye exhibits no discharge. Left eye exhibits no discharge. No scleral icterus.  Neck: Normal range of motion. Neck supple. No tracheal deviation present. No thyromegaly present.  Cardiovascular: Normal rate, regular rhythm and normal heart sounds.  Exam reveals no gallop and no friction rub.  Systolic ejection murmur.    Edema noted. Left lower extremity is greater than right. No murmur heard. Pulmonary/Chest: Effort normal and breath sounds normal. She has no wheezes. She has no rales.  Abdominal: Soft. Bowel sounds are normal. There is no tenderness.  Musculoskeletal: Normal range of motion. She exhibits no edema.  Lymphadenopathy:    She has no cervical adenopathy.  Neurological: She is alert and oriented to person, place, and time. She has normal reflexes. No cranial nerve deficit. Gait normal. Coordination normal.  Skin: Skin is warm and dry. No rash noted.  Psychiatric: Mood, memory, affect and judgment normal.  Nursing note and vitals reviewed.   LABORATORY DATA: I have reviewed the data as listed.  CBC    Component Value Date/Time   WBC 5.7 08/09/2015 1348   RBC 3.46*  08/09/2015 1348   RBC 2.43* 04/02/2015 1350   HGB 12.8 08/09/2015 1348   HCT 38.3 08/09/2015 1348   PLT 224 08/09/2015 1348   MCV 110.7* 08/09/2015 1348   MCH 37.0* 08/09/2015 1348   MCHC 33.4 08/09/2015 1348   RDW 14.9 08/09/2015 1348   LYMPHSABS 1.9 08/09/2015 1348   MONOABS 0.5 08/09/2015 1348   EOSABS 0.1 08/09/2015 1348   BASOSABS 0.0 08/09/2015 1348   CMP     Component Value Date/Time   NA 139 08/09/2015 1348  K 3.9 08/09/2015 1348   CL 106 08/09/2015 1348   CO2 29 08/09/2015 1348   GLUCOSE 105* 08/09/2015 1348   BUN 26* 08/09/2015 1348   CREATININE 0.72 08/09/2015 1348   CREATININE 0.76 06/16/2013 0715   CALCIUM 8.8* 08/09/2015 1348   PROT 7.5 08/09/2015 1348   ALBUMIN 3.7 08/09/2015 1348   AST 21 08/09/2015 1348   ALT 17 08/09/2015 1348   ALKPHOS 99 08/09/2015 1348   BILITOT 0.7 08/09/2015 1348   GFRNONAA >60 08/09/2015 1348   GFRAA >60 08/09/2015 1348   Results for ROZANNA, CORMANY (MRN [id number]) as of 08/09/2015 16:45  Ref. Range 03/16/2015 05:30 03/17/2015 05:49 03/18/2015 05:33 03/30/2015 07:40 04/02/2015 11:06 04/02/2015 19:46 04/03/2015 06:28 04/03/2015 14:03 04/04/2015 01:18 04/30/2015 11:05 08/09/2015 13:48  Platelets Latest Ref Range: 150-400 K/uL 241 214 201 363 301 264 260 264 243 277  224     ASSESSMENT and THERAPY PLAN:  JAK 2 positive MPD, Essential Thrombocytosis Acute blood loss anemia secondary to recent knee replacement 03/2015 Overdue for screening mammogram  She will continue with her hydrea with no change in dosing.  She was instructed to call with any bleeding or bruising. She will follow-up again in 6 months.  I will order a mammogram for Mrs. Shon Baton, and I will call her with the results of her blood tests today.  She would like her mammogram as late in the afternoon as possible, and not on a Friday.  All questions were answered. The patient knows to call the clinic with any problems, questions or concerns. We can certainly see the patient much  sooner if necessary.  This document serves as a record of services personally performed by Ancil Linsey, MD. It was created on her behalf by Toni Amend, a trained medical scribe. The creation of this record is based on the scribe's personal observations and the provider's statements to them. This document has been checked and approved by the attending provider.  I have reviewed the above documentation for accuracy and completeness, and I agree with the above.  This note was electronically signed.  Kelby Fam. Kathlynn Swofford MD

## 2015-08-09 NOTE — Progress Notes (Signed)
..  [name]'s reason for visit today are for labs as scheduled per MD orders.  Venipuncture performed with a 23 gauge butterfly needle to R Antecubital.  [name] tolerated venipuncture well and without incident; questions were answered and patient was discharged.

## 2015-08-09 NOTE — Progress Notes (Signed)
See office visit notes. 

## 2015-08-25 ENCOUNTER — Ambulatory Visit (HOSPITAL_COMMUNITY): Payer: Medicare Other

## 2015-08-30 DIAGNOSIS — Z96652 Presence of left artificial knee joint: Secondary | ICD-10-CM | POA: Diagnosis not present

## 2015-09-03 ENCOUNTER — Ambulatory Visit (HOSPITAL_COMMUNITY)
Admission: RE | Admit: 2015-09-03 | Discharge: 2015-09-03 | Disposition: A | Discharge status: Home / Self Care | Payer: Medicare Other | Source: Ambulatory Visit | Attending: Hematology & Oncology | Admitting: Hematology & Oncology

## 2015-09-03 DIAGNOSIS — Z1231 Encounter for screening mammogram for malignant neoplasm of breast: Secondary | ICD-10-CM

## 2015-09-03 DIAGNOSIS — Z96652 Presence of left artificial knee joint: Secondary | ICD-10-CM | POA: Diagnosis not present

## 2015-09-20 ENCOUNTER — Ambulatory Visit (INDEPENDENT_AMBULATORY_CARE_PROVIDER_SITE_OTHER): Payer: Medicare Other | Admitting: Internal Medicine

## 2015-09-24 ENCOUNTER — Other Ambulatory Visit (HOSPITAL_COMMUNITY): Payer: Self-pay | Admitting: Oncology

## 2015-09-24 DIAGNOSIS — D471 Chronic myeloproliferative disease: Secondary | ICD-10-CM

## 2015-09-24 MED ORDER — HYDROXYUREA 500 MG PO CAPS: 1000.0000 mg | ORAL_CAPSULE | Freq: Every day | ORAL | Status: DC

## 2015-09-30 DIAGNOSIS — Z96652 Presence of left artificial knee joint: Secondary | ICD-10-CM | POA: Diagnosis not present

## 2015-10-04 DIAGNOSIS — Z96652 Presence of left artificial knee joint: Secondary | ICD-10-CM | POA: Diagnosis not present

## 2015-10-11 ENCOUNTER — Emergency Department (HOSPITAL_COMMUNITY)
Admission: EM | Admit: 2015-10-11 | Discharge: 2015-10-11 | Disposition: A | Discharge status: Home / Self Care | Payer: Worker's Compensation | Attending: Emergency Medicine | Admitting: Emergency Medicine

## 2015-10-11 ENCOUNTER — Emergency Department (HOSPITAL_COMMUNITY): Payer: Worker's Compensation

## 2015-10-11 ENCOUNTER — Encounter (HOSPITAL_COMMUNITY): Payer: Self-pay | Admitting: Emergency Medicine

## 2015-10-11 DIAGNOSIS — S0181XA Laceration without foreign body of other part of head, initial encounter: Secondary | ICD-10-CM | POA: Diagnosis not present

## 2015-10-11 DIAGNOSIS — W01198A Fall on same level from slipping, tripping and stumbling with subsequent striking against other object, initial encounter: Secondary | ICD-10-CM | POA: Diagnosis not present

## 2015-10-11 DIAGNOSIS — Z8542 Personal history of malignant neoplasm of other parts of uterus: Secondary | ICD-10-CM | POA: Insufficient documentation

## 2015-10-11 DIAGNOSIS — Z7982 Long term (current) use of aspirin: Secondary | ICD-10-CM | POA: Diagnosis not present

## 2015-10-11 DIAGNOSIS — S0990XA Unspecified injury of head, initial encounter: Secondary | ICD-10-CM | POA: Diagnosis present

## 2015-10-11 DIAGNOSIS — Y9389 Activity, other specified: Secondary | ICD-10-CM | POA: Diagnosis not present

## 2015-10-11 DIAGNOSIS — R011 Cardiac murmur, unspecified: Secondary | ICD-10-CM | POA: Diagnosis not present

## 2015-10-11 DIAGNOSIS — K219 Gastro-esophageal reflux disease without esophagitis: Secondary | ICD-10-CM | POA: Diagnosis not present

## 2015-10-11 DIAGNOSIS — Y9289 Other specified places as the place of occurrence of the external cause: Secondary | ICD-10-CM | POA: Diagnosis not present

## 2015-10-11 DIAGNOSIS — M109 Gout, unspecified: Secondary | ICD-10-CM | POA: Diagnosis not present

## 2015-10-11 DIAGNOSIS — Z862 Personal history of diseases of the blood and blood-forming organs and certain disorders involving the immune mechanism: Secondary | ICD-10-CM | POA: Diagnosis not present

## 2015-10-11 DIAGNOSIS — Z8701 Personal history of pneumonia (recurrent): Secondary | ICD-10-CM | POA: Diagnosis not present

## 2015-10-11 DIAGNOSIS — Y998 Other external cause status: Secondary | ICD-10-CM | POA: Insufficient documentation

## 2015-10-11 DIAGNOSIS — I10 Essential (primary) hypertension: Secondary | ICD-10-CM | POA: Diagnosis not present

## 2015-10-11 DIAGNOSIS — Z79899 Other long term (current) drug therapy: Secondary | ICD-10-CM | POA: Insufficient documentation

## 2015-10-11 DIAGNOSIS — S0083XA Contusion of other part of head, initial encounter: Secondary | ICD-10-CM

## 2015-10-11 DIAGNOSIS — Z8709 Personal history of other diseases of the respiratory system: Secondary | ICD-10-CM | POA: Insufficient documentation

## 2015-10-11 DIAGNOSIS — M1990 Unspecified osteoarthritis, unspecified site: Secondary | ICD-10-CM | POA: Diagnosis not present

## 2015-10-11 DIAGNOSIS — R0602 Shortness of breath: Secondary | ICD-10-CM | POA: Insufficient documentation

## 2015-10-11 NOTE — ED Provider Notes (Signed)
CSN: [id number]     Arrival date & time 10/11/15  1329 History   First MD Initiated Contact with Patient 10/11/15 1536     Chief Complaint  Patient presents with  . Head Laceration    left     (Consider location/radiation/quality/duration/timing/severity/associated sxs/prior Treatment) Patient is a 74 y.o. female presenting with scalp laceration. The history is provided by the patient.  Head Laceration This is a new problem. The current episode started today. The problem has been unchanged. Associated symptoms include arthralgias. Pertinent negatives include no numbness or weakness. Nothing aggravates the symptoms. She has tried nothing for the symptoms. The treatment provided no relief.    Past Medical History  Diagnosis Date  . Arthritis   . Hypertension   . GERD (gastroesophageal reflux disease)   . Obesity, morbid (more than 100 lbs over ideal weight or BMI > 40) (HCC) 08/2011    Ht 5'7", wt 280 lb  . Bronchitis, chronic obstructive ([place]) since 2000's  . Shortness of breath     unable to lay flat due to dyspnea  . Hiatal hernia   . Hiatal hernia 11/19/2012  . Cancer [facility])     dx with endometrial cancer   . Heart murmur   . Myeloproliferative disorder, JAK-2 positive 01/05/2014  . Polycythemia vera(238.4) 01/18/2014  . Gout   . Pneumonia 09/2014  . Dysrhythmia   . PONV (postoperative nausea and vomiting)     post-op hypoxic resp failure 09/2011 and require Bi-Pap (possible r/t - pressure pulm edema vs asp PNA, COPD exacerbation, obesity hypoventilation  . Anemia 04/02/2015  . Obesity    Past Surgical History  Procedure Laterality Date  . Hysteroscopy w/d&c  08/15/2011    Procedure: DILATATION AND CURETTAGE (D&C) /HYSTEROSCOPY;  Surgeon: Jonnie Kind, MD;  Location: [facility];  Service: Gynecology;  Laterality: N/A;  With Suction Curette  . Abdominal hysterectomy  09/12/2011    Procedure: HYSTERECTOMY ABDOMINAL;  Surgeon: Janie Morning, MD;  Location: [facility];  Service:  Gynecology;  Laterality: N/A;  Total Abdominal Hysterectomy, Bilateral Salpingo Oophorectomy  . Salpingoophorectomy  09/12/2011    Procedure: SALPINGO OOPHERECTOMY;  Surgeon: Janie Morning, MD;  Location: [facility];  Service: Gynecology;  Laterality: Bilateral;  . Colonoscopy  07/05/2012    Procedure: COLONOSCOPY;  Surgeon: Rogene Houston, MD;  Location: [facility];  Service: Endoscopy;  Laterality: N/A;  730  . Knee arthroplasty Left 03/15/2015    Procedure: COMPUTER ASSISTED TOTAL KNEE ARTHROPLASTY;  Surgeon: Marybelle Killings, MD;  Location: [place];  Service: Orthopedics;  Laterality: Left;  . Flexible sigmoidoscopy N/A 04/03/2015    Procedure: FLEXIBLE SIGMOIDOSCOPY;  Surgeon: Rogene Houston, MD;  Location: [facility];  Service: Endoscopy;  Laterality: N/A;   Family History  Problem Relation Age of Onset  . Anesthesia problems Neg Hx   . Hypotension Neg Hx   . Malignant hyperthermia Neg Hx   . Pseudochol deficiency Neg Hx   . Other Mother   . Cancer Father   . Hypertension Son    Social History  Substance Use Topics  . Smoking status: Never Smoker   . Smokeless tobacco: Never Used  . Alcohol Use: No   OB History    Gravida Para Term Preterm AB TAB SAB Ectopic Multiple Living   5 5   0     5     Review of Systems  Respiratory: Positive for shortness of breath.   Musculoskeletal: Positive for arthralgias.  Neurological:  Negative for weakness and numbness.  All other systems reviewed and are negative.     Allergies  Carbamazepine and Sulfa antibiotics  Home Medications   Prior to Admission medications   Medication Sig Start Date End Date Taking? Authorizing Provider  albuterol (PROVENTIL HFA;VENTOLIN HFA) 108 (90 BASE) MCG/ACT inhaler Inhale 2 puffs into the lungs every 6 (six) hours as needed for wheezing or shortness of breath.    Historical Provider, MD  aspirin EC 325 MG EC tablet Take 1 tablet (325 mg total) by mouth daily with breakfast. 03/18/15   Lanae Crumbly, PA-C   atenolol (TENORMIN) 50 MG tablet Take 25 mg by mouth 2 (two) times daily.     Historical Provider, MD  ciprofloxacin (CIPRO) 500 MG tablet Take 1 tablet (500 mg total) by mouth 2 (two) times daily. Patient not taking: Reported on 08/09/2015 04/04/15   Doree Albee, MD  cloNIDine (CATAPRES) 0.3 MG tablet Take 0.15 mg by mouth 2 (two) times daily.     Historical Provider, MD  diclofenac (VOLTAREN) 75 MG EC tablet  08/03/15   Historical Provider, MD  diltiazem (CARDIZEM CD) 180 MG 24 hr capsule Take 180 mg by mouth at bedtime.  03/05/13   Historical Provider, MD  docusate sodium (COLACE) 100 MG capsule Take 100 mg by mouth as needed.     Historical Provider, MD  esomeprazole (NEXIUM) 40 MG capsule Take 1 capsule (40 mg total) by mouth every morning. Patient taking differently: Take 40 mg by mouth at bedtime.  09/15/14   Butch Penny, NP  HYDROcodone-acetaminophen (NORCO) 10-325 MG per tablet Take 1-2 tablets by mouth every 6 (six) hours as needed (breakthrough pain). 03/18/15   Lanae Crumbly, PA-C  hydroxyurea (HYDREA) 500 MG capsule Take 2 capsules (1,000 mg total) by mouth daily. 09/24/15   Baird Cancer, PA-C  losartan (COZAAR) 100 MG tablet Take 100 mg by mouth daily.  02/25/13   Historical Provider, MD  metroNIDAZOLE (FLAGYL) 250 MG tablet Take 1 tablet (250 mg total) by mouth every 8 (eight) hours. Patient not taking: Reported on 08/09/2015 04/04/15   Doree Albee, MD  Simethicone (GAS RELIEF PO) Take 1 tablet by mouth as needed (gas).     Historical Provider, MD  simvastatin (ZOCOR) 10 MG tablet Take 10 mg by mouth daily after supper.    Historical Provider, MD  traZODone (DESYREL) 50 MG tablet Take 1 tablet by mouth at bedtime. 04/01/15   Historical Provider, MD   BP 158/60 mmHg  Pulse 58  Temp(Src) 97.6 F (36.4 C) (Oral)  Resp 14  Ht 5\' 4"  (1.626 m)  Wt 124.739 kg  BMI 47.18 kg/m2  SpO2 97% Physical Exam  Constitutional: She is oriented to person, place, and time. She appears  well-developed and well-nourished.  Non-toxic appearance.  HENT:  Head: Normocephalic.    Right Ear: Tympanic membrane and external ear normal.  Left Ear: Tympanic membrane and external ear normal.  Eyes: EOM and lids are normal. Pupils are equal, round, and reactive to light.  Neck: Normal range of motion. Neck supple. Carotid bruit is not present.  Cardiovascular: Normal rate, regular rhythm, normal heart sounds, intact distal pulses and normal pulses.   Pulmonary/Chest: Breath sounds normal. No respiratory distress.  Abdominal: Soft. Bowel sounds are normal. There is no tenderness. There is no guarding.  Musculoskeletal: Normal range of motion.  Lymphadenopathy:       Head (right side): No submandibular adenopathy present.  Head (left side): No submandibular adenopathy present.    She has no cervical adenopathy.  Neurological: She is alert and oriented to person, place, and time. She has normal strength. No cranial nerve deficit or sensory deficit. Coordination normal.  Skin: Skin is warm and dry.  Psychiatric: She has a normal mood and affect. Her speech is normal.  Nursing note and vitals reviewed.   ED Course  .Marland KitchenLaceration Repair Date/Time: 10/11/2015 6:02 PM Performed by: Lily Kocher Authorized by: Lily Kocher Consent: Verbal consent obtained. Risks and benefits: risks, benefits and alternatives were discussed Consent given by: patient Patient understanding: patient states understanding of the procedure being performed Patient identity confirmed: arm band Time out: Immediately prior to procedure a "time out" was called to verify the correct patient, procedure, equipment, support staff and site/side marked as required. Body area: head/neck Location details: left eyebrow Laceration length: 1.4 cm Foreign bodies: no foreign bodies Preparation: Patient was prepped and draped in the usual sterile fashion. Irrigation solution: saline Amount of cleaning:  standard Skin closure: glue Approximation: close Approximation difficulty: simple Patient tolerance: Patient tolerated the procedure well with no immediate complications   (including critical care time) Labs Review Labs Reviewed - No data to display  Imaging Review Pt seen with me by Dr Dayna Barker. Ct Head Wo Contrast  10/11/2015  CLINICAL DATA:  Fall face for today.  Hit head on floor. EXAM: CT HEAD WITHOUT CONTRAST CT CERVICAL SPINE WITHOUT CONTRAST TECHNIQUE: Multidetector CT imaging of the head and cervical spine was performed following the standard protocol without intravenous contrast. Multiplanar CT image reconstructions of the cervical spine were also generated. COMPARISON:  09/04/2010 FINDINGS: CT HEAD FINDINGS Low-density throughout the deep white matter compatible with chronic ischemic changes. This is stable. No acute intracranial abnormality. Specifically, no hemorrhage, hydrocephalus, mass lesion, acute infarction, or significant intracranial injury. No acute calvarial abnormality. Visualized paranasal sinuses and mastoids clear. Orbital soft tissues unremarkable. CT CERVICAL SPINE FINDINGS Degenerative disc and facet disease throughout the cervical spine. Prevertebral soft tissues are normal. Erosions noted within the anterior aspect of C1 and the dens of C2 related to arthritis. Normal alignment. No fracture. No epidural or paraspinal hematoma. IMPRESSION: Chronic small vessel disease throughout the deep white matter. No acute intracranial abnormality. No acute bony abnormality in the cervical spine. Electronically Signed   By: Rolm Baptise M.D.   On: 10/11/2015 15:24   Ct Cervical Spine Wo Contrast  10/11/2015  CLINICAL DATA:  Fall face for today.  Hit head on floor. EXAM: CT HEAD WITHOUT CONTRAST CT CERVICAL SPINE WITHOUT CONTRAST TECHNIQUE: Multidetector CT imaging of the head and cervical spine was performed following the standard protocol without intravenous contrast. Multiplanar CT  image reconstructions of the cervical spine were also generated. COMPARISON:  09/04/2010 FINDINGS: CT HEAD FINDINGS Low-density throughout the deep white matter compatible with chronic ischemic changes. This is stable. No acute intracranial abnormality. Specifically, no hemorrhage, hydrocephalus, mass lesion, acute infarction, or significant intracranial injury. No acute calvarial abnormality. Visualized paranasal sinuses and mastoids clear. Orbital soft tissues unremarkable. CT CERVICAL SPINE FINDINGS Degenerative disc and facet disease throughout the cervical spine. Prevertebral soft tissues are normal. Erosions noted within the anterior aspect of C1 and the dens of C2 related to arthritis. Normal alignment. No fracture. No epidural or paraspinal hematoma. IMPRESSION: Chronic small vessel disease throughout the deep white matter. No acute intracranial abnormality. No acute bony abnormality in the cervical spine. Electronically Signed   By: Rolm Baptise M.D.  On: 10/11/2015 15:24   I have personally reviewed and evaluated these images and lab results as part of my medical decision-making.   EKG Interpretation None      MDM  Vital signs reviewed. CT head and neck negative for fracture or dislocation. The laceration was repaired with dermabond. Discussed signs of infection, and need to return.    Final diagnoses:  Laceration of forehead, initial encounter  Contusion of forehead, initial encounter    **I have reviewed nursing notes, vital signs, and all appropriate lab and imaging results for this patient.    Lily Kocher, PA-C 10/11/15 1807  Merrily Pew, MD 10/12/15 [phone]

## 2015-10-11 NOTE — ED Provider Notes (Signed)
Medical screening examination/treatment/procedure(s) were conducted as a shared visit with non-physician practitioner(s) and myself.  I personally evaluated the patient during the encounter.  Mechanical fall with head laceration, repaired with glue prior to my evaluation. On my exam, neuro intact, no bony ttp. Heart rrr no m/r/g. Lungs CTAB no w/r/r. Review of images without injury. Stable for dc.    EKG Interpretation None        Merrily Pew, MD 10/12/15 [phone]

## 2015-10-11 NOTE — ED Notes (Signed)
Tripped over strip in door way.  Hit left fore head, denies pain, c/o burning.

## 2015-10-11 NOTE — Discharge Instructions (Signed)
D CT scan of your head and cervical spine are both negative for acute changes. Your laceration was repaired with Dermabond. Please allow this to come off on its own over the next 8-10 days. Please do not apply any petroleum products to the wound site. Please see your primary physician, or return to the emergency department if any signs of advancing infection. Facial or Scalp Contusion  A facial or scalp contusion is a deep bruise on the face or head. Contusions happen when an injury causes bleeding under the skin. Signs of bruising include pain, puffiness (swelling), and discolored skin. The contusion may turn blue, purple, or yellow. HOME CARE  Only take medicines as told by your doctor.  Put ice on the injured area.  Put ice in a plastic bag.  Place a towel between your skin and the bag.  Leave the ice on for 20 minutes, 2-3 times a day. GET HELP IF:  You have bite problems.  You have pain when chewing.  You are worried about your face not healing normally. GET HELP RIGHT AWAY IF:   You have severe pain or a headache and medicine does not help.  You are very tired or confused, or your personality changes.  You throw up (vomit).  You have a nosebleed that will not stop.  You see two of everything (double vision) or have blurry vision.  You have fluid coming from your nose or ear.  You have problems walking or using your arms or legs. MAKE SURE YOU:   Understand these instructions.  Will watch your condition.  Will get help right away if you are not doing well or get worse.   This information is not intended to replace advice given to you by your health care provider. Make sure you discuss any questions you have with your health care provider.   Document Released: 10/12/2011 Document Revised: 11/13/2014 Document Reviewed: 06/05/2013 Elsevier Interactive Patient Education [employer].

## 2015-10-30 DIAGNOSIS — Z96652 Presence of left artificial knee joint: Secondary | ICD-10-CM | POA: Diagnosis not present

## 2015-11-03 DIAGNOSIS — Z96652 Presence of left artificial knee joint: Secondary | ICD-10-CM | POA: Diagnosis not present

## 2015-11-30 DIAGNOSIS — Z96652 Presence of left artificial knee joint: Secondary | ICD-10-CM | POA: Diagnosis not present

## 2015-12-04 DIAGNOSIS — Z96652 Presence of left artificial knee joint: Secondary | ICD-10-CM | POA: Diagnosis not present

## 2015-12-23 ENCOUNTER — Other Ambulatory Visit (HOSPITAL_COMMUNITY): Payer: Self-pay | Admitting: Oncology

## 2015-12-31 DIAGNOSIS — Z96652 Presence of left artificial knee joint: Secondary | ICD-10-CM | POA: Diagnosis not present

## 2016-01-04 DIAGNOSIS — Z96652 Presence of left artificial knee joint: Secondary | ICD-10-CM | POA: Diagnosis not present

## 2016-02-07 ENCOUNTER — Encounter (HOSPITAL_COMMUNITY): Payer: Self-pay | Admitting: Hematology & Oncology

## 2016-02-07 ENCOUNTER — Encounter (HOSPITAL_COMMUNITY): Payer: Medicare Other

## 2016-02-07 ENCOUNTER — Encounter (HOSPITAL_COMMUNITY): Payer: Medicare Other | Attending: Hematology & Oncology | Admitting: Hematology & Oncology

## 2016-02-07 VITALS — BP 160/80 | HR 60 | Temp 98.2°F | Resp 22 | Wt 279.0 lb

## 2016-02-07 DIAGNOSIS — I10 Essential (primary) hypertension: Secondary | ICD-10-CM | POA: Insufficient documentation

## 2016-02-07 DIAGNOSIS — C541 Malignant neoplasm of endometrium: Secondary | ICD-10-CM

## 2016-02-07 DIAGNOSIS — Z8542 Personal history of malignant neoplasm of other parts of uterus: Secondary | ICD-10-CM | POA: Diagnosis not present

## 2016-02-07 DIAGNOSIS — D72829 Elevated white blood cell count, unspecified: Secondary | ICD-10-CM

## 2016-02-07 DIAGNOSIS — Z96652 Presence of left artificial knee joint: Secondary | ICD-10-CM | POA: Diagnosis not present

## 2016-02-07 DIAGNOSIS — K219 Gastro-esophageal reflux disease without esophagitis: Secondary | ICD-10-CM | POA: Diagnosis not present

## 2016-02-07 DIAGNOSIS — Z79899 Other long term (current) drug therapy: Secondary | ICD-10-CM | POA: Insufficient documentation

## 2016-02-07 DIAGNOSIS — D471 Chronic myeloproliferative disease: Secondary | ICD-10-CM

## 2016-02-07 DIAGNOSIS — Z90710 Acquired absence of both cervix and uterus: Secondary | ICD-10-CM | POA: Insufficient documentation

## 2016-02-07 DIAGNOSIS — J984 Other disorders of lung: Secondary | ICD-10-CM | POA: Diagnosis not present

## 2016-02-07 DIAGNOSIS — K449 Diaphragmatic hernia without obstruction or gangrene: Secondary | ICD-10-CM | POA: Insufficient documentation

## 2016-02-07 DIAGNOSIS — D500 Iron deficiency anemia secondary to blood loss (chronic): Secondary | ICD-10-CM | POA: Diagnosis not present

## 2016-02-07 DIAGNOSIS — D473 Essential (hemorrhagic) thrombocythemia: Secondary | ICD-10-CM | POA: Diagnosis not present

## 2016-02-07 DIAGNOSIS — Z8489 Family history of other specified conditions: Secondary | ICD-10-CM | POA: Diagnosis not present

## 2016-02-07 DIAGNOSIS — Z7982 Long term (current) use of aspirin: Secondary | ICD-10-CM | POA: Diagnosis not present

## 2016-02-07 DIAGNOSIS — C946 Myelodysplastic disease, not classified: Secondary | ICD-10-CM | POA: Insufficient documentation

## 2016-02-07 DIAGNOSIS — M1990 Unspecified osteoarthritis, unspecified site: Secondary | ICD-10-CM | POA: Diagnosis not present

## 2016-02-07 DIAGNOSIS — R918 Other nonspecific abnormal finding of lung field: Secondary | ICD-10-CM | POA: Insufficient documentation

## 2016-02-07 DIAGNOSIS — Z1239 Encounter for other screening for malignant neoplasm of breast: Secondary | ICD-10-CM

## 2016-02-07 DIAGNOSIS — D62 Acute posthemorrhagic anemia: Secondary | ICD-10-CM | POA: Diagnosis not present

## 2016-02-07 DIAGNOSIS — Z9889 Other specified postprocedural states: Secondary | ICD-10-CM | POA: Diagnosis not present

## 2016-02-07 LAB — CBC WITH DIFFERENTIAL/PLATELET
BASOS ABS: 0 10*3/uL (ref 0.0–0.1)
BASOS PCT: 0 %
EOS PCT: 3 %
Eosinophils Absolute: 0.2 10*3/uL (ref 0.0–0.7)
HCT: 39.2 % (ref 36.0–46.0)
Hemoglobin: 13.1 g/dL (ref 12.0–15.0)
Lymphocytes Relative: 34 %
Lymphs Abs: 2.2 10*3/uL (ref 0.7–4.0)
MCH: 37.4 pg — ABNORMAL HIGH (ref 26.0–34.0)
MCHC: 33.4 g/dL (ref 30.0–36.0)
MCV: 112 fL — ABNORMAL HIGH (ref 78.0–100.0)
MONO ABS: 0.5 10*3/uL (ref 0.1–1.0)
Monocytes Relative: 8 %
Neutro Abs: 3.6 10*3/uL (ref 1.7–7.7)
Neutrophils Relative %: 56 %
PLATELETS: 224 10*3/uL (ref 150–400)
RBC: 3.5 MIL/uL — ABNORMAL LOW (ref 3.87–5.11)
RDW: 13.6 % (ref 11.5–15.5)
WBC: 6.5 10*3/uL (ref 4.0–10.5)

## 2016-02-07 LAB — COMPREHENSIVE METABOLIC PANEL
ALBUMIN: 3.9 g/dL (ref 3.5–5.0)
ALT: 16 U/L (ref 14–54)
ANION GAP: 7 (ref 5–15)
AST: 20 U/L (ref 15–41)
Alkaline Phosphatase: 77 U/L (ref 38–126)
BUN: 22 mg/dL — AB (ref 6–20)
CALCIUM: 9 mg/dL (ref 8.9–10.3)
CHLORIDE: 103 mmol/L (ref 101–111)
CO2: 30 mmol/L (ref 22–32)
CREATININE: 0.6 mg/dL (ref 0.44–1.00)
GFR calc Af Amer: >60 mL/min (ref >60)
GLUCOSE: 90 mg/dL (ref 65–99)
Potassium: 4.2 mmol/L (ref 3.5–5.1)
Sodium: 140 mmol/L (ref 135–145)
Total Bilirubin: 0.6 mg/dL (ref 0.3–1.2)
Total Protein: 7.5 g/dL (ref 6.5–8.1)

## 2016-02-07 MED ORDER — ALBUTEROL SULFATE HFA 108 (90 BASE) MCG/ACT IN AERS: 2.0000 | INHALATION_SPRAY | Freq: Four times a day (QID) | RESPIRATORY_TRACT | Status: DC | PRN

## 2016-02-07 NOTE — Patient Instructions (Addendum)
[place] at [facility] Discharge Instructions  RECOMMENDATIONS MADE BY THE CONSULTANT AND ANY TEST RESULTS WILL BE SENT TO YOUR REFERRING PHYSICIAN.   Exam and discussion by Dr Whitney Muse today Labs work was good today  Albuterol sent to [facility] in October  Return to see the doctor in 6 months with labs  Please call the clinic if you have any questions or concerns    Thank you for choosing [place] at [facility] to provide your oncology and hematology care.  To afford each patient quality time with our provider, please arrive at least 15 minutes before your scheduled appointment time.   Beginning January 23rd 2017 lab work for the [facility] will be done in the  Main lab at [facility] on 1st floor. If you have a lab appointment with the [facility] please come in thru the  Main Entrance and check in at the main information desk  You need to re-schedule your appointment should you arrive 10 or more minutes late.  We strive to give you quality time with our providers, and arriving late affects you and other patients whose appointments are after yours.  Also, if you no show three or more times for appointments you may be dismissed from the clinic at the providers discretion.     Again, thank you for choosing [facility].  Our hope is that these requests will decrease the amount of time that you wait before being seen by our physicians.       _____________________________________________________________  Should you have questions after your visit to [facility], please contact our office at [phone] between the hours of 8:30 a.m. and 4:30 p.m.  Voicemails left after 4:30 p.m. will not be returned until the following business day.  For prescription refill requests, have your pharmacy contact our office.         Resources For Cancer Patients and their Caregivers ? American Cancer  Society: Can assist with transportation, wigs, general needs, runs Look Good Feel Better.        (916)511-8055 ? Cancer Care: Provides financial assistance, online support groups, medication/co-pay assistance.  1-800-813-HOPE 727 579 6913) ? [facility] Assists [place] cancer patients and their families through emotional , educational and financial support.  873-726-6771 ? [place] DSS Where to apply for food stamps, Medicaid and utility assistance. 941-477-9815 ? [facility]: Transportation to medical appointments. 585-668-6509 ? Social Security Administration: May apply for disability if have a Stage IV cancer. 515-758-4996 (360)812-0859 ? [employer], Disability and Transit Services: Assists with nutrition, care and transit needs. 669-648-6434

## 2016-02-07 NOTE — Assessment & Plan Note (Signed)
She was encouraged to make a follow-up appointment with Dr. Luan Pulling as she has seen him in the past for her underlying pulmonary disease. I have refilled her albuterol inhaler one time only. I discussed the importance of ongoing care with both her PCP and pulmonary for her chronic issues.

## 2016-02-07 NOTE — Progress Notes (Signed)
[name], [place] / [place] [address]   DIAGNOSIS: JAK 2 positive MPD, Essential Thrombocytosis Acute blood loss anemia secondary to recent knee replacement 03/2015 R wrist 3 views on 06/03/2013 ordered by Dr. Aline Brochure with eccentric lesion in the metaphyseal epiphyseal region approximately 12 x 8 mm Ddx includes giant cell tumor, benign bone lesion  CURRENT THERAPY: Hydrea  INTERVAL HISTORY: [name] 75 y.o. female returns for follow up of JAK 2 positive Essential Thrombocytosis.  [name] is accompanied by her husband. I personally reviewed and went over laboratory results with the patient. Her O2 levels were initially low today.   Reports she has always had trouble with her oxygen levels, though it is not constant. Attributes difficulty breathing today to walking a lot. She also notes that she has allergies that make it harder to breathe in the spring and fall.  She has trouble breathing sometimes when laying down. She has not been assessed for sleep apnea. At home, their bed is elevated so she can breath at night.  Her PCP is Dr. Willey Blade. She received an inhaler from Dr. Luan Pulling. Her inhaler ran out last night and she does not have another one. She notes that she has not followed up with Dr. Luan Pulling in "a while."    She denies any bleeding or bruising. She is compliant with her hydrea. Repeat O2 saturation is 96%    MEDICAL HISTORY: Past Medical History  Diagnosis Date  . Arthritis   . Hypertension   . GERD (gastroesophageal reflux disease)   . Obesity, morbid (more than 100 lbs over ideal weight or BMI > 40) (HCC) 08/2011    Ht 5'7", wt 280 lb  . Bronchitis, chronic obstructive ([place]) since 2000's  . Shortness of breath     unable to lay flat due to dyspnea  . Hiatal hernia   . Hiatal hernia 11/19/2012  . Cancer [facility])     dx with endometrial cancer   . Heart murmur   . Myeloproliferative disorder, JAK-2 positive 01/05/2014  .  Polycythemia vera(238.4) 01/18/2014  . Gout   . Pneumonia 09/2014  . Dysrhythmia   . PONV (postoperative nausea and vomiting)     post-op hypoxic resp failure 09/2011 and require Bi-Pap (possible r/t - pressure pulm edema vs asp PNA, COPD exacerbation, obesity hypoventilation  . Anemia 04/02/2015  . Obesity     has KNEE, ARTHRITIS, DEGEN./OSTEO; Chronic lung disease; Endometrial cancer ([place]); Acute respiratory failure ([place]); Bilateral pulmonary infiltrates on chest x-ray; Leukocytosis; Hyperglycemia; GERD (gastroesophageal reflux disease); Hiatal hernia; CMC arthritis, thumb, degenerative; Bone, giant cell tumor; Thrombocytosis ([place]); Myeloproliferative disorder, JAK-2 positive; Status post total left knee replacement; Hematochezia; Rectal bleeding; Bronchitis, chronic obstructive ([place]); Obesity, morbid (more than 100 lbs over ideal weight or BMI > 40) ([place]); Arthritis; Hypertension; Anemia; Dysrhythmia; Obesity; and Essential hypertension on her problem list.     is allergic to carbamazepine and sulfa antibiotics.  Current Outpatient Prescriptions on File Prior to Visit  Medication Sig Dispense Refill  . aspirin EC 325 MG EC tablet Take 1 tablet (325 mg total) by mouth daily with breakfast. 30 tablet 0  . atenolol (TENORMIN) 50 MG tablet Take 25 mg by mouth 2 (two) times daily.     . cloNIDine (CATAPRES) 0.3 MG tablet Take 0.15 mg by mouth 2 (two) times daily.     . diclofenac (VOLTAREN) 75 MG EC tablet     . diltiazem (CARDIZEM CD) 180 MG 24  hr capsule Take 180 mg by mouth at bedtime.     Marland Kitchen esomeprazole (NEXIUM) 40 MG capsule Take 1 capsule (40 mg total) by mouth every morning. (Patient taking differently: Take 40 mg by mouth at bedtime. ) 30 capsule 11  . hydroxyurea (HYDREA) 500 MG capsule TAKE TWO CAPSULES BY MOUTH ONCE DAILY 180 capsule 1  . losartan (COZAAR) 100 MG tablet Take 100 mg by mouth daily.     . Simethicone (GAS RELIEF PO) Take 1 tablet by mouth as needed (gas).     .  simvastatin (ZOCOR) 10 MG tablet Take 10 mg by mouth daily after supper.    . traZODone (DESYREL) 50 MG tablet Take 1 tablet by mouth at bedtime.    Marland Kitchen albuterol (PROVENTIL HFA;VENTOLIN HFA) 108 (90 BASE) MCG/ACT inhaler Inhale 2 puffs into the lungs every 6 (six) hours as needed for wheezing or shortness of breath. Reported on 02/07/2016    . ciprofloxacin (CIPRO) 500 MG tablet Take 1 tablet (500 mg total) by mouth 2 (two) times daily. (Patient not taking: Reported on 08/09/2015) 14 tablet 0  . docusate sodium (COLACE) 100 MG capsule Take 100 mg by mouth as needed. Reported on 02/07/2016    . HYDROcodone-acetaminophen (NORCO) 10-325 MG per tablet Take 1-2 tablets by mouth every 6 (six) hours as needed (breakthrough pain). (Patient not taking: Reported on 02/07/2016) 60 tablet 0  . metroNIDAZOLE (FLAGYL) 250 MG tablet Take 1 tablet (250 mg total) by mouth every 8 (eight) hours. (Patient not taking: Reported on 08/09/2015) 21 tablet 0   No current facility-administered medications on file prior to visit.    SURGICAL HISTORY: Past Surgical History  Procedure Laterality Date  . Hysteroscopy w/d&c  08/15/2011    Procedure: DILATATION AND CURETTAGE (D&C) /HYSTEROSCOPY;  Surgeon: Jonnie Kind, MD;  Location: [facility];  Service: Gynecology;  Laterality: N/A;  With Suction Curette  . Abdominal hysterectomy  09/12/2011    Procedure: HYSTERECTOMY ABDOMINAL;  Surgeon: Janie Morning, MD;  Location: [facility];  Service: Gynecology;  Laterality: N/A;  Total Abdominal Hysterectomy, Bilateral Salpingo Oophorectomy  . Salpingoophorectomy  09/12/2011    Procedure: SALPINGO OOPHERECTOMY;  Surgeon: Janie Morning, MD;  Location: [facility];  Service: Gynecology;  Laterality: Bilateral;  . Colonoscopy  07/05/2012    Procedure: COLONOSCOPY;  Surgeon: Rogene Houston, MD;  Location: [facility];  Service: Endoscopy;  Laterality: N/A;  730  . Knee arthroplasty Left 03/15/2015    Procedure: COMPUTER ASSISTED TOTAL KNEE ARTHROPLASTY;   Surgeon: Marybelle Killings, MD;  Location: [facility];  Service: Orthopedics;  Laterality: Left;  . Flexible sigmoidoscopy N/A 04/03/2015    Procedure: FLEXIBLE SIGMOIDOSCOPY;  Surgeon: Rogene Houston, MD;  Location: [facility];  Service: Endoscopy;  Laterality: N/A;    SOCIAL HISTORY: Social History   Social History  . Marital Status: Married    Spouse Name: N/A  . Number of Children: N/A  . Years of Education: N/A   Occupational History  . Not on file.   Social History Main Topics  . Smoking status: Never Smoker   . Smokeless tobacco: Never Used  . Alcohol Use: No  . Drug Use: No  . Sexual Activity: No     Comment: hysterectomy   Other Topics Concern  . Not on file   Social History Narrative  lives in [place]: Family History  Problem Relation Age of Onset  . Anesthesia problems Neg Hx   . Hypotension Neg Hx   .  Malignant hyperthermia Neg Hx   . Pseudochol deficiency Neg Hx   . Other Mother   . Cancer Father   . Hypertension Son     Review of Systems  Constitutional: Positive for malaise/fatigue. Negative for fever, chills and weight loss.  HENT: Negative for hearing loss.   Eyes: Negative for blurred vision and double vision.  Respiratory: Positive for shortness of breath. Negative for cough.   Chronic SOB Cardiovascular: Negative for chest pain and palpitations.  Gastrointestinal: Negative for heartburn, nausea, vomiting, abdominal pain, diarrhea, constipation, blood in stool and melena.  Genitourinary: Negative for dysuria and hematuria.  Musculoskeletal: Positive for joint pain. Negative for myalgias.  Skin: Negative for rash.  Neurological:  Negative for dizziness, tingling, seizures, loss of consciousness and headaches.  Endo/Heme/Allergies: Negative.   Psychiatric/Behavioral: Negative for depression and suicidal ideas. The patient is not nervous/anxious and does not have insomnia.    14 point review of systems was performed and is negative  except as detailed under history of present illness and above   PHYSICAL EXAMINATION  ECOG PERFORMANCE STATUS: 1 - Symptomatic but completely ambulatory  Filed Vitals:   02/07/16 1350  BP: 160/80  Pulse: 60  Temp: 98.2 F (36.8 C)  Resp: 22    Physical Exam  Constitutional: She is oriented to person, place, and time and well-developed, well-nourished, and in no distress. Obese  Able to get onto the exam table with assistance. HENT:  Head: Normocephalic and atraumatic.  Nose: Nose normal.  Mouth/Throat: Oropharynx is clear and moist. No oropharyngeal exudate.  Eyes: Conjunctivae and EOM are normal. Pupils are equal, round, and reactive to light. Right eye exhibits no discharge. Left eye exhibits no discharge. No scleral icterus.  Neck: Normal range of motion. Neck supple. No tracheal deviation present. No thyromegaly present.  Cardiovascular: Normal rate, regular rhythm and normal heart sounds.  Exam reveals no gallop and no friction rub.  Systolic ejection murmur.    Edema noted. Left lower extremity is greater than right. No murmur heard. Pulmonary/Chest: Effort normal and breath sounds normal. She has no wheezes. She has no rales.  Abdominal: Soft. Bowel sounds are normal. There is no tenderness.  Musculoskeletal: Normal range of motion. She exhibits no edema.  Lymphadenopathy:    She has no cervical adenopathy.  Neurological: She is alert and oriented to person, place, and time. She has normal reflexes. No cranial nerve deficit.  Gait is slow, needs assistance onto the exam table Skin: Skin is warm and dry. No rash noted.  Psychiatric: Mood, memory, affect and judgment normal.  Nursing note and vitals reviewed.   LABORATORY DATA: I have reviewed the data as listed.  CBC    Component Value Date/Time   WBC 6.5 02/07/2016 1257   RBC 3.50* 02/07/2016 1257   RBC 2.43* 04/02/2015 1350   HGB 13.1 02/07/2016 1257   HCT 39.2 02/07/2016 1257   PLT 224 02/07/2016 1257    MCV 112.0* 02/07/2016 1257   MCH 37.4* 02/07/2016 1257   MCHC 33.4 02/07/2016 1257   RDW 13.6 02/07/2016 1257   LYMPHSABS 2.2 02/07/2016 1257   MONOABS 0.5 02/07/2016 1257   EOSABS 0.2 02/07/2016 1257   BASOSABS 0.0 02/07/2016 1257   CMP     Component Value Date/Time   NA 140 02/07/2016 1257   K 4.2 02/07/2016 1257   CL 103 02/07/2016 1257   CO2 30 02/07/2016 1257   GLUCOSE 90 02/07/2016 1257   BUN 22* 02/07/2016 1257   CREATININE  0.60 02/07/2016 1257   CREATININE 0.76 06/16/2013 0715   CALCIUM 9.0 02/07/2016 1257   PROT 7.5 02/07/2016 1257   ALBUMIN 3.9 02/07/2016 1257   AST 20 02/07/2016 1257   ALT 16 02/07/2016 1257   ALKPHOS 77 02/07/2016 1257   BILITOT 0.6 02/07/2016 1257   GFRNONAA >60 02/07/2016 1257   GFRAA >60 02/07/2016 1257   Results for KAIRI, ARCHILLA (MRN [id number]) as of 02/07/2016 14:00  Ref. Range 04/30/2015 11:05 08/09/2015 13:48 09/03/2015 12:00 10/11/2015 15:15 02/07/2016 12:57  Platelets Latest Ref Range: 150-400 K/uL 277 224   224    ASSESSMENT and THERAPY PLAN:  JAK 2 positive MPD, Essential Thrombocytosis Acute blood loss anemia secondary to recent knee replacement 03/2015  Myeloproliferative disorder, JAK-2 positive Counts are stable on current dose of hydrea. She is compliant. She is doing well from a hematologic perspective. She is to continue her on current dose and follow-up again in 6 months with repeat labs.   Chronic lung disease She was encouraged to make a follow-up appointment with Dr. Luan Pulling as she has seen him in the past for her underlying pulmonary disease. I have refilled her albuterol inhaler one time only. I discussed the importance of ongoing care with both her PCP and pulmonary for her chronic issues.    All questions were answered. The patient knows to call the clinic with any problems, questions or concerns. We can certainly see the patient much sooner if necessary.  This document serves as a record of services personally  performed by Ancil Linsey, MD. It was created on her behalf by Arlyce Harman, a trained medical scribe. The creation of this record is based on the scribe's personal observations and the provider's statements to them. This document has been checked and approved by the attending provider.  I have reviewed the above documentation for accuracy and completeness, and I agree with the above.  This note was electronically signed.  Kelby Fam. Penland MD

## 2016-02-07 NOTE — Assessment & Plan Note (Signed)
Counts are stable on current dose of hydrea. She is compliant. She is doing well from a hematologic perspective. She is to continue her on current dose and follow-up again in 6 months with repeat labs.

## 2016-04-06 ENCOUNTER — Other Ambulatory Visit (HOSPITAL_COMMUNITY)
Admission: RE | Admit: 2016-04-06 | Discharge: 2016-04-06 | Disposition: A | Discharge status: Home / Self Care | Payer: Medicare Other | Source: Ambulatory Visit | Attending: Adult Health | Admitting: Adult Health

## 2016-04-06 ENCOUNTER — Encounter: Payer: Self-pay | Admitting: Adult Health

## 2016-04-06 ENCOUNTER — Ambulatory Visit (INDEPENDENT_AMBULATORY_CARE_PROVIDER_SITE_OTHER): Payer: Medicare Other | Admitting: Adult Health

## 2016-04-06 VITALS — BP 156/70 | HR 68 | Ht 64.0 in | Wt 282.0 lb

## 2016-04-06 DIAGNOSIS — Z8542 Personal history of malignant neoplasm of other parts of uterus: Secondary | ICD-10-CM

## 2016-04-06 DIAGNOSIS — Z01419 Encounter for gynecological examination (general) (routine) without abnormal findings: Secondary | ICD-10-CM | POA: Diagnosis not present

## 2016-04-06 DIAGNOSIS — Z1211 Encounter for screening for malignant neoplasm of colon: Secondary | ICD-10-CM

## 2016-04-06 DIAGNOSIS — Z1151 Encounter for screening for human papillomavirus (HPV): Secondary | ICD-10-CM | POA: Insufficient documentation

## 2016-04-06 DIAGNOSIS — Z01411 Encounter for gynecological examination (general) (routine) with abnormal findings: Secondary | ICD-10-CM | POA: Insufficient documentation

## 2016-04-06 DIAGNOSIS — Z08 Encounter for follow-up examination after completed treatment for malignant neoplasm: Secondary | ICD-10-CM | POA: Insufficient documentation

## 2016-04-06 HISTORY — DX: Personal history of malignant neoplasm of other parts of uterus: Z85.42

## 2016-04-06 LAB — HEMOCCULT GUIAC POC 1CARD (OFFICE): FECAL OCCULT BLD: NEGATIVE

## 2016-04-06 NOTE — Progress Notes (Signed)
Patient ID: [name], female   DOB: 01/15/1941, 75 y.o.   MRN: [id number] History of Present Illness: [name] is a 75 year old white female, married in for well woman gyn exam and pap.She is sp hysterectomy for endometrial cancer, in 2012.She is still working. PCP is Dr Willey Blade and she sees Dr Whitney Muse.   Current Medications, Allergies, Past Medical History, Past Surgical History, Family History and Social History were reviewed in [employer] record.     Review of Systems:  Patient denies any headaches, hearing loss, fatigue, blurred vision,chest pain, abdominal pain, problems with bowel movements, urination, or intercourse(not having sex). Nomood swings.Has shortness of breath and pain in joints, had left knee replacement 2016.   Physical Exam:BP 156/70 mmHg  Pulse 68  Ht 5\' 4"  (1.626 m)  Wt 282 lb (127.914 kg)  BMI 48.38 kg/m2 General:  Well developed, well nourished, no acute distress Skin:  Warm and dry,increased number of moles Neck:  Midline trachea, normal thyroid, good ROM, no lymphadenopathy,no carotid bruits heard Lungs; Clear to auscultation bilaterally Breast:  No dominant palpable mass, retraction, or nipple discharge Cardiovascular: Regular rate and rhythm,+murmur Abdomen:  Soft, non tender, no hepatosplenomegaly,obese Pelvic:  External genitalia is normal in appearance, no lesions.  The vagina is atrophic, pap smear performed with HPV. Urethra has no lesions or masses. The cervix and uterus are absent. No adnexal masses or tenderness noted.Bladder is non tender, no masses felt. Rectal: Good sphincter tone, no polyps, or hemorrhoids felt.  Hemoccult negative. Extremities/musculoskeletal:  No clubbing or cyanosis, has swelling lower legs, L>R and some spider veins  Psych:  No mood changes, alert and cooperative,seems happy   Impression: Well woman gyn exam and pap History of endometrial cancer    Plan: Pap and physical in 1 year Mammogram  yearly Colonoscopy per Dr Trenton Gammon with PCP

## 2016-04-10 LAB — CYTOLOGY - PAP

## 2016-04-12 ENCOUNTER — Encounter: Payer: Self-pay | Admitting: Adult Health

## 2016-04-12 ENCOUNTER — Telehealth: Payer: Self-pay | Admitting: Adult Health

## 2016-04-12 DIAGNOSIS — R87629 Unspecified abnormal cytological findings in specimens from vagina: Secondary | ICD-10-CM

## 2016-04-12 HISTORY — DX: Unspecified abnormal cytological findings in specimens from vagina: R87.629

## 2016-04-12 NOTE — Telephone Encounter (Signed)
lEft message to call me

## 2016-04-12 NOTE — Telephone Encounter (Signed)
Pt aware pap smear showed atypical glandular cells, and needs colpo and biopsy,since sp hysterectomy for endometrial cancer

## 2016-04-20 ENCOUNTER — Ambulatory Visit (INDEPENDENT_AMBULATORY_CARE_PROVIDER_SITE_OTHER): Payer: Medicare Other | Admitting: Obstetrics and Gynecology

## 2016-04-20 ENCOUNTER — Encounter: Payer: Self-pay | Admitting: Obstetrics and Gynecology

## 2016-04-20 ENCOUNTER — Other Ambulatory Visit: Payer: Self-pay | Admitting: Obstetrics and Gynecology

## 2016-04-20 VITALS — BP 160/80 | Ht 64.0 in | Wt 280.0 lb

## 2016-04-20 DIAGNOSIS — Z8542 Personal history of malignant neoplasm of other parts of uterus: Secondary | ICD-10-CM | POA: Diagnosis not present

## 2016-04-20 DIAGNOSIS — R87629 Unspecified abnormal cytological findings in specimens from vagina: Secondary | ICD-10-CM

## 2016-04-20 DIAGNOSIS — R87620 Atypical squamous cells of undetermined significance on cytologic smear of vagina (ASC-US): Secondary | ICD-10-CM

## 2016-04-20 DIAGNOSIS — C541 Malignant neoplasm of endometrium: Secondary | ICD-10-CM | POA: Insufficient documentation

## 2016-04-20 DIAGNOSIS — Z90710 Acquired absence of both cervix and uterus: Secondary | ICD-10-CM | POA: Diagnosis not present

## 2016-04-20 DIAGNOSIS — N898 Other specified noninflammatory disorders of vagina: Secondary | ICD-10-CM | POA: Diagnosis not present

## 2016-04-20 NOTE — Progress Notes (Signed)
Patient ID: [name], female   DOB: Jun 16, 1941, 75 y.o.   MRN: [id number]   [name] 75 y.o. [id number] here for colposcopy for glandular cell abnormality (AGUS) pap smear on 04/06/16 (per comments on most recent pap, "There are atypical glandular cells within the specimen. Although the findings may represent a metaplastic process with reactive atypia, given the history of endometrial adenocarcinoma, further tissue evaluation is prudent, as clinically indicated.")  Discussed role for HPV in cervical dysplasia, need for surveillance.   Patient denies any vaginal bleeding. She states she takes a daily aspirin 81 mg but otherwise denies being on any anticoagulation.   Patient given informed consent, signed copy in the chart, time out was performed.  Placed in lithotomy position. Cervix viewed with speculum and colposcope after application of acetic acid.   Physical Exam: Vagina: Well-healed vaginal cuff, with smooth vaginal apex, normal vaginal mucosa   Colposcopy adequate? Yes  no visible lesions, no mosaicism, no punctation and no abnormal vasculature; biopsies obtained at left vaginal apex.   ECC specimen obtained.  All specimens were labelled and sent to pathology.  Colposcopy IMPRESSION: Normal vaginal mucosa biopsies pending.  Patient was given post procedure instructions. Will follow up pathology and manage accordingly.  Results available by MyChart.  Routine preventative health maintenance measures emphasized.  Will repeat pap smear every 6 months.    By signing my name below, I, Stephania Fragmin, attest that this documentation has been prepared under the direction and in the presence of Jonnie Kind, MD. Electronically Signed: Stephania Fragmin, ED Scribe. 04/20/2016. 2:12 PM.  I personally performed the services described in this documentation, which was SCRIBED in my presence. The recorded information has been reviewed and considered accurate. It has been edited as necessary during  review. Jonnie Kind, MD

## 2016-04-27 ENCOUNTER — Telehealth: Payer: Self-pay | Admitting: *Deleted

## 2016-04-27 NOTE — Telephone Encounter (Signed)
Pt aware of results. Pt very happy results were negative.

## 2016-06-20 ENCOUNTER — Other Ambulatory Visit (HOSPITAL_COMMUNITY): Payer: Self-pay | Admitting: Oncology

## 2016-08-17 DIAGNOSIS — N3000 Acute cystitis without hematuria: Secondary | ICD-10-CM | POA: Diagnosis not present

## 2016-08-21 ENCOUNTER — Other Ambulatory Visit: Payer: Self-pay | Admitting: *Deleted

## 2016-08-21 ENCOUNTER — Telehealth: Payer: Self-pay | Admitting: Orthopedic Surgery

## 2016-08-21 MED ORDER — TRAMADOL-ACETAMINOPHEN 37.5-325 MG PO TABS: 1.0000 | ORAL_TABLET | ORAL | 0 refills | Status: AC | PRN

## 2016-08-21 NOTE — Telephone Encounter (Signed)
Request received via fax from [facility], [place], for refill:  Ultracet 37.5-325MG  tablet, quantity 90; 1 tablet by mouth every 4 hours as needed.  Their fax [phone] / patient ph# is [phone]

## 2016-08-21 NOTE — Telephone Encounter (Signed)
yes

## 2016-08-21 NOTE — Telephone Encounter (Signed)
Routing to Dr Harrison for approval 

## 2016-08-22 NOTE — Telephone Encounter (Signed)
Faxed refill

## 2016-08-23 ENCOUNTER — Ambulatory Visit (HOSPITAL_COMMUNITY): Payer: Self-pay | Admitting: Hematology & Oncology

## 2016-08-23 ENCOUNTER — Other Ambulatory Visit (HOSPITAL_COMMUNITY): Payer: Self-pay

## 2016-08-31 ENCOUNTER — Encounter (HOSPITAL_COMMUNITY): Payer: Self-pay | Admitting: Oncology

## 2016-08-31 ENCOUNTER — Encounter (HOSPITAL_COMMUNITY): Payer: Medicare Other | Attending: Oncology | Admitting: Oncology

## 2016-08-31 ENCOUNTER — Encounter (HOSPITAL_COMMUNITY): Payer: Medicare Other

## 2016-08-31 VITALS — BP 134/60 | HR 59 | Temp 98.0°F | Resp 18 | Wt 282.3 lb

## 2016-08-31 DIAGNOSIS — C541 Malignant neoplasm of endometrium: Secondary | ICD-10-CM | POA: Diagnosis not present

## 2016-08-31 DIAGNOSIS — D473 Essential (hemorrhagic) thrombocythemia: Secondary | ICD-10-CM | POA: Diagnosis not present

## 2016-08-31 DIAGNOSIS — Z1239 Encounter for other screening for malignant neoplasm of breast: Secondary | ICD-10-CM

## 2016-08-31 LAB — CBC WITH DIFFERENTIAL/PLATELET
BASOS ABS: 0 10*3/uL (ref 0.0–0.1)
BASOS PCT: 0 %
Eosinophils Absolute: 0.1 10*3/uL (ref 0.0–0.7)
Eosinophils Relative: 2 %
HEMATOCRIT: 39.9 % (ref 36.0–46.0)
HEMOGLOBIN: 13.4 g/dL (ref 12.0–15.0)
LYMPHS PCT: 33 %
Lymphs Abs: 1.9 10*3/uL (ref 0.7–4.0)
MCH: 39 pg — ABNORMAL HIGH (ref 26.0–34.0)
MCHC: 33.6 g/dL (ref 30.0–36.0)
MCV: 116 fL — AB (ref 78.0–100.0)
MONO ABS: 0.5 10*3/uL (ref 0.1–1.0)
Monocytes Relative: 9 %
NEUTROS ABS: 3.3 10*3/uL (ref 1.7–7.7)
NEUTROS PCT: 56 %
Platelets: 212 10*3/uL (ref 150–400)
RBC: 3.44 MIL/uL — ABNORMAL LOW (ref 3.87–5.11)
RDW: 12.4 % (ref 11.5–15.5)
WBC: 5.9 10*3/uL (ref 4.0–10.5)

## 2016-08-31 LAB — COMPREHENSIVE METABOLIC PANEL
ALBUMIN: 4 g/dL (ref 3.5–5.0)
ALK PHOS: 76 U/L (ref 38–126)
ALT: 21 U/L (ref 14–54)
AST: 24 U/L (ref 15–41)
Anion gap: 5 (ref 5–15)
BILIRUBIN TOTAL: 0.6 mg/dL (ref 0.3–1.2)
BUN: 16 mg/dL (ref 6–20)
CALCIUM: 9.3 mg/dL (ref 8.9–10.3)
CO2: 32 mmol/L (ref 22–32)
CREATININE: 0.68 mg/dL (ref 0.44–1.00)
Chloride: 99 mmol/L — ABNORMAL LOW (ref 101–111)
GFR calc Af Amer: >60 mL/min (ref >60)
GLUCOSE: 102 mg/dL — AB (ref 65–99)
Potassium: 4.3 mmol/L (ref 3.5–5.1)
Sodium: 136 mmol/L (ref 135–145)
TOTAL PROTEIN: 7.6 g/dL (ref 6.5–8.1)

## 2016-08-31 MED ORDER — HYDROXYUREA 500 MG PO CAPS: 500.0000 mg | ORAL_CAPSULE | Freq: Two times a day (BID) | ORAL | 1 refills | Status: DC

## 2016-08-31 NOTE — Progress Notes (Signed)
[name], [place] / [place] [address]   DIAGNOSIS: JAK 2 positive MPD, Essential Thrombocytosis Acute blood loss anemia secondary to recent knee replacement 03/2015 R wrist 3 views on 06/03/2013 ordered by Dr. Aline Brochure with eccentric lesion in the metaphyseal epiphyseal region approximately 12 x 8 mm Ddx includes giant cell tumor, benign bone lesion  CURRENT THERAPY: Hydrea  INTERVAL HISTORY: [name] 75 y.o. female returns for follow up of JAK 2 positive Essential Thrombocytosis.  [name] is accompanied by her husband. I personally reviewed and went over laboratory results with the patient. Her O2 levels were initially low today.  Patient is here for ongoing evaluation.  Taking Hydrea 2 capsules every day without any significant problems Recently had a fall and went to emergency room Patient has a regular mammogram as well as had flu vaccine.     MEDICAL HISTORY: Past Medical History:  Diagnosis Date  . Anemia 04/02/2015  . Arthritis   . Atypical glandular cells on vaginal Papanicolaou smear 04/12/2016   Well get colpo and biopsy with JVF  . Bronchitis, chronic obstructive ([place]) since 2000's  . Cancer [facility])    dx with endometrial cancer   . Dysrhythmia   . GERD (gastroesophageal reflux disease)   . Gout   . Heart murmur   . Hiatal hernia   . Hiatal hernia 11/19/2012  . History of endometrial cancer 04/06/2016   Sp hysterectomy 2012  . Hypertension   . Myeloproliferative disorder, JAK-2 positive 01/05/2014  . Obesity   . Obesity, morbid (more than 100 lbs over ideal weight or BMI > 40) (HCC) 08/2011   Ht 5'7", wt 280 lb  . Pneumonia 09/2014  . Polycythemia vera(238.4) 01/18/2014  . PONV (postoperative nausea and vomiting)    post-op hypoxic resp failure 09/2011 and require Bi-Pap (possible r/t - pressure pulm edema vs asp PNA, COPD exacerbation, obesity hypoventilation  . Shortness of breath    unable to lay flat due to dyspnea     has KNEE, ARTHRITIS, DEGEN./OSTEO; Chronic lung disease; Endometrial cancer ([place]); Acute respiratory failure ([place]); Bilateral pulmonary infiltrates on chest x-ray; Leukocytosis; Hyperglycemia; GERD (gastroesophageal reflux disease); Hiatal hernia; CMC arthritis, thumb, degenerative; Bone, giant cell tumor; Thrombocytosis ([place]); Myeloproliferative disorder, JAK-2 positive; Status post total left knee replacement; Hematochezia; Rectal bleeding; Bronchitis, chronic obstructive ([place]); Obesity, morbid (more than 100 lbs over ideal weight or BMI > 40) ([place]); Arthritis; Hypertension; Anemia; Dysrhythmia; Obesity; Essential hypertension; History of endometrial cancer; Atypical glandular cells on vaginal Papanicolaou smear; and Endometrial cancer, grade I ([place]) on her problem list.     is allergic to carbamazepine and sulfa antibiotics.  Current Outpatient Prescriptions on File Prior to Visit  Medication Sig Dispense Refill  . albuterol (PROVENTIL HFA;VENTOLIN HFA) 108 (90 Base) MCG/ACT inhaler Inhale 2 puffs into the lungs every 6 (six) hours as needed for wheezing or shortness of breath. Reported on 02/07/2016 1 Inhaler 0  . aspirin EC 325 MG EC tablet Take 1 tablet (325 mg total) by mouth daily with breakfast. 30 tablet 0  . atenolol (TENORMIN) 50 MG tablet Take 25 mg by mouth 2 (two) times daily.     . cloNIDine (CATAPRES) 0.3 MG tablet Take 0.15 mg by mouth 2 (two) times daily.     . diclofenac (VOLTAREN) 75 MG EC tablet     . diltiazem (CARDIZEM CD) 180 MG 24 hr capsule Take 180 mg by mouth at bedtime.     . docusate  sodium (COLACE) 100 MG capsule Take 100 mg by mouth as needed. Reported on 02/07/2016    . esomeprazole (NEXIUM) 40 MG capsule Take 1 capsule (40 mg total) by mouth every morning. (Patient taking differently: Take 40 mg by mouth at bedtime. ) 30 capsule 11  . hydroxyurea (HYDREA) 500 MG capsule TAKE TWO CAPSULES BY MOUTH ONCE DAILY 180 capsule 1  . losartan (COZAAR) 100 MG tablet Take 100  mg by mouth daily.     . Simethicone (GAS RELIEF PO) Take 1 tablet by mouth as needed (gas).     . simvastatin (ZOCOR) 10 MG tablet Take 10 mg by mouth daily after supper.    . traMADol-acetaminophen (ULTRACET) 37.5-325 MG tablet Take 1 tablet by mouth every 4 (four) hours as needed. 90 tablet 0  . traZODone (DESYREL) 50 MG tablet Take 1 tablet by mouth at bedtime.     No current facility-administered medications on file prior to visit.     SURGICAL HISTORY: Past Surgical History:  Procedure Laterality Date  . ABDOMINAL HYSTERECTOMY  09/12/2011   Procedure: HYSTERECTOMY ABDOMINAL;  Surgeon: Janie Morning, MD;  [facility];  Service: Gynecology;  Laterality: N/A;  Total Abdominal Hysterectomy, Bilateral Salpingo Oophorectomy  . COLONOSCOPY  07/05/2012   Procedure: COLONOSCOPY;  Surgeon: Rogene Houston, MD;  [facility];  Service: Endoscopy;  Laterality: N/A;  730  . FLEXIBLE SIGMOIDOSCOPY N/A 04/03/2015   Procedure: FLEXIBLE SIGMOIDOSCOPY;  Surgeon: Rogene Houston, MD;  [facility];  Service: Endoscopy;  Laterality: N/A;  . HYSTEROSCOPY W/D&C  08/15/2011   Procedure: DILATATION AND CURETTAGE (D&C) /HYSTEROSCOPY;  Surgeon: Jonnie Kind, MD;  Location: [facility];  Service: Gynecology;  Laterality: N/A;  With Suction Curette  . KNEE ARTHROPLASTY Left 03/15/2015   Procedure: COMPUTER ASSISTED TOTAL KNEE ARTHROPLASTY;  Surgeon: Marybelle Killings, MD;  Location: [facility];  Service: Orthopedics;  Laterality: Left;  . SALPINGOOPHORECTOMY  09/12/2011   Procedure: SALPINGO OOPHERECTOMY;  Surgeon: Janie Morning, MD;  [facility];  Service: Gynecology;  Laterality: Bilateral;    SOCIAL HISTORY: Social History   Social History  . Marital status: Married    Spouse name: N/A  . Number of children: N/A  . Years of education: N/A   Occupational History  . Not on file.   Social History Main Topics  . Smoking status: Never Smoker  . Smokeless tobacco: Never Used  . Alcohol  use No  . Drug use: No  . Sexual activity: No     Comment: hysterectomy   Other Topics Concern  . Not on file   Social History Narrative  . No narrative on file  lives in [place]: Family History  Problem Relation Age of Onset  . Anesthesia problems Neg Hx   . Hypotension Neg Hx   . Malignant hyperthermia Neg Hx   . Pseudochol deficiency Neg Hx   . Other Mother   . Cancer Father   . Hypertension Son     Review of Systems  Constitutional: Positive for fatigue. Negative for fever, chills and weight loss.  HENT: Negative for hearing loss.   Eyes: Negative for blurred vision and double vision.  Respiratory: Positive for shortness of breath  On exertion  Negative for cough.   Chronic SOB Cardiovascular: Negative for chest pain and palpitations.  Gastrointestinal: Negative for heartburn, nausea, vomiting, abdominal pain, diarrhea, constipation, blood in stool and melena.  Genitourinary: Negative for dysuria and hematuria.  Musculoskeletal:  Positive for joint pain. Negative for myalgias.  Skin: Negative for rash.  Neurological:  Negative for dizziness, tingling, seizures, loss of consciousness and headaches.  Endo/Heme/Allergies: Negative.   Psychiatric/Behavioral: Negative for depression and suicidal ideas. The patient is not nervous/anxious and does not have insomnia.    14 point review of systems was performed and is negative except as detailed under history of present illness and above   PHYSICAL EXAMINATION  ECOG PERFORMANCE STATUS: 1 - Symptomatic but completely ambulatory  Blood pressure 134/60, pulse (!) 59, temperature 98 F (36.7 C), temperature source Oral, resp. rate 18, weight 282 lb 4.8 oz (128.1 kg), SpO2 100 %.    Physical Exam  Constitutional: She is oriented to person, place, and time and well-developed, well-nourished, and in no distress. Obese  Able to get onto the exam table with assistance. HENT:  Head: Normocephalic and  atraumatic.  Nose: Nose normal.  Mouth/Throat: Oropharynx is clear and moist. No oropharyngeal exudate.  Eyes: Conjunctivae and EOM are normal. Pupils are equal, round, and reactive to light. Right eye exhibits no discharge. Left eye exhibits no discharge. No scleral icterus.  Neck: Normal range of motion. Neck supple. No tracheal deviation present. No thyromegaly present.  Cardiovascular: Normal rate, regular rhythm and normal heart sounds.  Exam reveals no gallop and no friction rub.  Systolic ejection murmur.   No  evidence of swelling No murmur heard. Pulmonary/Chest: Effort normal and breath sounds normal. She has no wheezes. She has no rales.  Abdominal: Soft. Bowel sounds are normal. There is no tenderness.  Musculoskeletal: Normal range of motion. She exhibits no edema.  Lymphadenopathy:    She has no cervical adenopathy.  Neurological: She is alert and oriented to person, place, and time. She has normal reflexes. No cranial nerve deficit.  Gait is slow, needs assistance onto the exam table Skin: Skin is warm and dry. No rash noted.  Psychiatric: Mood, memory, affect and judgment normal.  Nursing note and vitals reviewed.   LABORATORY DATA: I have reviewed the data as listed.   CBC CBC Latest Ref Rng & Units 08/31/2016 02/07/2016 08/09/2015  WBC 4.0 - 10.5 K/uL 5.9 6.5 5.7  Hemoglobin 12.0 - 15.0 g/dL 13.4 13.1 12.8  Hematocrit 36.0 - 46.0 % 39.9 39.2 38.3  Platelets 150 - 400 K/uL 212 224 224      Component Value Date/Time   WBC 5.9 08/31/2016 1331   RBC 3.44 (L) 08/31/2016 1331   HGB 13.4 08/31/2016 1331   HCT 39.9 08/31/2016 1331   PLT 212 08/31/2016 1331   MCV 116.0 (H) 08/31/2016 1331   MCH 39.0 (H) 08/31/2016 1331   MCHC 33.6 08/31/2016 1331   RDW 12.4 08/31/2016 1331   LYMPHSABS 1.9 08/31/2016 1331   MONOABS 0.5 08/31/2016 1331   EOSABS 0.1 08/31/2016 1331   BASOSABS 0.0 08/31/2016 1331   CMP     Component Value Date/Time   NA 136 08/31/2016 1331   K 4.3  08/31/2016 1331   CL 99 (L) 08/31/2016 1331   CO2 32 08/31/2016 1331   GLUCOSE 102 (H) 08/31/2016 1331   BUN 16 08/31/2016 1331   CREATININE 0.68 08/31/2016 1331   CREATININE 0.76 06/16/2013 0715   CALCIUM 9.3 08/31/2016 1331   PROT 7.6 08/31/2016 1331   ALBUMIN 4.0 08/31/2016 1331   AST 24 08/31/2016 1331   ALT 21 08/31/2016 1331   ALKPHOS 76 08/31/2016 1331   BILITOT 0.6 08/31/2016 1331   GFRNONAA >60 08/31/2016 1331  GFRAA >60 08/31/2016 1331      ASSESSMENT and THERAPY PLAN:  JAK 2 positive MPD, Essential Thrombocytosis Reviewing blood count and platelet count is an acceptable range Continue Hydrea 2 capsules daily Return appointment in 6 months with CBC and comprehensive metabolic panel

## 2016-08-31 NOTE — Patient Instructions (Signed)
[name] at [facility] Discharge Instructions  RECOMMENDATIONS MADE BY THE CONSULTANT AND ANY TEST RESULTS WILL BE SENT TO YOUR REFERRING PHYSICIAN.  Continue to take your Hydrea as you have been. Be sure to continue to do your mammogram yearly.   Return to the clinic in 6 months for a doctor appointment. Please see Amy as you leave for appointment days and times.    Thank you for choosing [place] at [facility] to provide your oncology and hematology care.  To afford each patient quality time with our provider, please arrive at least 15 minutes before your scheduled appointment time.   Beginning January 23rd 2017 lab work for the [facility] will be done in the  Main lab at [facility] on 1st floor. If you have a lab appointment with the [facility] please come in thru the  Main Entrance and check in at the main information desk  You need to re-schedule your appointment should you arrive 10 or more minutes late.  We strive to give you quality time with our providers, and arriving late affects you and other patients whose appointments are after yours.  Also, if you no show three or more times for appointments you may be dismissed from the clinic at the providers discretion.     Again, thank you for choosing [facility].  Our hope is that these requests will decrease the amount of time that you wait before being seen by our physicians.       _____________________________________________________________  Should you have questions after your visit to [facility], please contact our office at [phone] between the hours of 8:30 a.m. and 4:30 p.m.  Voicemails left after 4:30 p.m. will not be returned until the following business day.  For prescription refill requests, have your pharmacy contact our office.         Resources For Cancer Patients and their Caregivers ? American Cancer Society: Can assist with  transportation, wigs, general needs, runs Look Good Feel Better.        251-854-0168 ? Cancer Care: Provides financial assistance, online support groups, medication/co-pay assistance.  1-800-813-HOPE (618)100-4562) ? [place] Assists [place] cancer patients and their families through emotional , educational and financial support.  201-208-4966 ? [place] DSS Where to apply for food stamps, Medicaid and utility assistance. 765-079-2995 ? [facility]: Transportation to medical appointments. (564) 436-6149 ? Social Security Administration: May apply for disability if have a Stage IV cancer. 201-064-9476 506-863-2938 ? [employer], Disability and Transit Services: Assists with nutrition, care and transit needs. [place] Support Programs: @10RELATIVEDAYS @ > Cancer Support Group  2nd Tuesday of the month 1pm-2pm, Journey Room  > Creative Journey  3rd Tuesday of the month 1130am-1pm, Journey Room  > Look Good Feel Better  1st Wednesday of the month 10am-12 noon, Journey Room (Call [place] to register [phone])

## 2016-09-04 ENCOUNTER — Ambulatory Visit (HOSPITAL_COMMUNITY): Payer: Self-pay

## 2016-09-21 ENCOUNTER — Ambulatory Visit (HOSPITAL_COMMUNITY)
Admission: RE | Admit: 2016-09-21 | Discharge: 2016-09-21 | Disposition: A | Discharge status: Home / Self Care | Payer: Medicare Other | Source: Ambulatory Visit | Attending: Internal Medicine | Admitting: Internal Medicine

## 2016-09-21 ENCOUNTER — Other Ambulatory Visit (HOSPITAL_COMMUNITY): Payer: Self-pay | Admitting: Internal Medicine

## 2016-09-21 DIAGNOSIS — I700 Atherosclerosis of aorta: Secondary | ICD-10-CM | POA: Insufficient documentation

## 2016-09-21 DIAGNOSIS — R600 Localized edema: Secondary | ICD-10-CM | POA: Diagnosis not present

## 2016-09-21 DIAGNOSIS — M5136 Other intervertebral disc degeneration, lumbar region: Secondary | ICD-10-CM | POA: Diagnosis not present

## 2016-09-21 DIAGNOSIS — R52 Pain, unspecified: Secondary | ICD-10-CM

## 2016-09-21 DIAGNOSIS — M545 Low back pain: Secondary | ICD-10-CM | POA: Diagnosis not present

## 2016-10-04 DIAGNOSIS — M545 Low back pain: Secondary | ICD-10-CM | POA: Diagnosis not present

## 2016-10-05 ENCOUNTER — Other Ambulatory Visit (HOSPITAL_COMMUNITY): Payer: Self-pay | Admitting: Internal Medicine

## 2016-10-05 DIAGNOSIS — M545 Low back pain, unspecified: Secondary | ICD-10-CM

## 2016-10-10 ENCOUNTER — Telehealth (INDEPENDENT_AMBULATORY_CARE_PROVIDER_SITE_OTHER): Payer: Self-pay | Admitting: Orthopaedic Surgery

## 2016-10-10 DIAGNOSIS — D471 Chronic myeloproliferative disease: Secondary | ICD-10-CM

## 2016-10-10 DIAGNOSIS — M1990 Unspecified osteoarthritis, unspecified site: Secondary | ICD-10-CM

## 2016-10-10 MED ORDER — DICLOFENAC SODIUM 75 MG PO TBEC: 75.0000 mg | DELAYED_RELEASE_TABLET | Freq: Two times a day (BID) | ORAL | 3 refills | Status: DC

## 2016-10-10 NOTE — Telephone Encounter (Signed)
Patient calling re Rx Diclofenac.  She had requested the refill thru her [facility] in [place] last Thursday.  She states they have not heard from our office since the request.

## 2016-10-10 NOTE — Telephone Encounter (Signed)
Ok for refill? 

## 2016-10-10 NOTE — Telephone Encounter (Signed)
I called patient and advised. Script called to pharmacy.

## 2016-10-10 NOTE — Telephone Encounter (Signed)
Called to pharmacy 

## 2016-10-10 NOTE — Telephone Encounter (Signed)
Ok refill times 3 thanks 

## 2016-10-13 ENCOUNTER — Ambulatory Visit (HOSPITAL_COMMUNITY): Payer: Medicare Other

## 2016-10-14 ENCOUNTER — Inpatient Hospital Stay: Admission: RE | Admit: 2016-10-14 | Payer: Self-pay | Source: Ambulatory Visit

## 2016-10-20 ENCOUNTER — Ambulatory Visit (INDEPENDENT_AMBULATORY_CARE_PROVIDER_SITE_OTHER): Payer: Medicare Other | Admitting: Obstetrics and Gynecology

## 2016-10-20 ENCOUNTER — Encounter: Payer: Self-pay | Admitting: Obstetrics and Gynecology

## 2016-10-20 ENCOUNTER — Other Ambulatory Visit (HOSPITAL_COMMUNITY)
Admission: RE | Admit: 2016-10-20 | Discharge: 2016-10-20 | Disposition: A | Discharge status: Home / Self Care | Payer: Medicare Other | Source: Ambulatory Visit | Attending: Obstetrics and Gynecology | Admitting: Obstetrics and Gynecology

## 2016-10-20 VITALS — BP 140/60 | HR 66 | Wt 182.2 lb

## 2016-10-20 DIAGNOSIS — Z124 Encounter for screening for malignant neoplasm of cervix: Secondary | ICD-10-CM

## 2016-10-20 DIAGNOSIS — C541 Malignant neoplasm of endometrium: Secondary | ICD-10-CM

## 2016-10-20 DIAGNOSIS — Z1212 Encounter for screening for malignant neoplasm of rectum: Secondary | ICD-10-CM

## 2016-10-20 DIAGNOSIS — R87619 Unspecified abnormal cytological findings in specimens from cervix uteri: Secondary | ICD-10-CM

## 2016-10-20 DIAGNOSIS — Z8542 Personal history of malignant neoplasm of other parts of uterus: Secondary | ICD-10-CM

## 2016-10-20 DIAGNOSIS — Z1151 Encounter for screening for human papillomavirus (HPV): Secondary | ICD-10-CM | POA: Diagnosis present

## 2016-10-20 DIAGNOSIS — Z01411 Encounter for gynecological examination (general) (routine) with abnormal findings: Secondary | ICD-10-CM | POA: Insufficient documentation

## 2016-10-20 DIAGNOSIS — Z01419 Encounter for gynecological examination (general) (routine) without abnormal findings: Secondary | ICD-10-CM | POA: Diagnosis not present

## 2016-10-20 LAB — HEMOCCULT GUIAC POC 1CARD (OFFICE): FECAL OCCULT BLD: NEGATIVE

## 2016-10-20 NOTE — Addendum Note (Signed)
Addended by: Octaviano Glow on: 10/20/2016 01:50 PM   Modules accepted: Orders

## 2016-10-20 NOTE — Progress Notes (Signed)
Assessment:  Annual Gyn Exam  monitoring s/p Grade II Ca Endometrium now 5 yrs wi/o recurrence. Plan:  1. pap smear done, next pap due 1 yr 2. return annually or prn 3    Annual mammogram advised Subjective:  [name] is a 75 y.o. female [id number] who presents for annual exam. No LMP recorded. Patient has had a hysterectomy. The patient has complaints today of none   The following portions of the patient's history were reviewed and updated as appropriate: allergies, current medications, past family history, past medical history, past social history, past surgical history and problem list. Past Medical History:  Diagnosis Date  . Anemia 04/02/2015  . Arthritis   . Atypical glandular cells on vaginal Papanicolaou smear 04/12/2016   Well get colpo and biopsy with JVF  . Bronchitis, chronic obstructive ([place]) since 2000's  . Cancer [facility])    dx with endometrial cancer   . Dysrhythmia   . GERD (gastroesophageal reflux disease)   . Gout   . Heart murmur   . Hiatal hernia   . Hiatal hernia 11/19/2012  . History of endometrial cancer 04/06/2016   Sp hysterectomy 2012  . Hypertension   . Myeloproliferative disorder, JAK-2 positive 01/05/2014  . Obesity   . Obesity, morbid (more than 100 lbs over ideal weight or BMI > 40) (HCC) 08/2011   Ht 5'7", wt 280 lb  . Pneumonia 09/2014  . Polycythemia vera(238.4) 01/18/2014  . PONV (postoperative nausea and vomiting)    post-op hypoxic resp failure 09/2011 and require Bi-Pap (possible r/t - pressure pulm edema vs asp PNA, COPD exacerbation, obesity hypoventilation  . Shortness of breath    unable to lay flat due to dyspnea  . Vaginal Pap smear, abnormal     Past Surgical History:  Procedure Laterality Date  . ABDOMINAL HYSTERECTOMY  09/12/2011   Procedure: HYSTERECTOMY ABDOMINAL;  Surgeon: Janie Morning, MD;  [facility];  Service: Gynecology;  Laterality: N/A;  Total Abdominal Hysterectomy, Bilateral Salpingo Oophorectomy  .  COLONOSCOPY  07/05/2012   Procedure: COLONOSCOPY;  Surgeon: Rogene Houston, MD;  [facility];  Service: Endoscopy;  Laterality: N/A;  730  . FLEXIBLE SIGMOIDOSCOPY N/A 04/03/2015   Procedure: FLEXIBLE SIGMOIDOSCOPY;  Surgeon: Rogene Houston, MD;  [facility];  Service: Endoscopy;  Laterality: N/A;  . HYSTEROSCOPY W/D&C  08/15/2011   Procedure: DILATATION AND CURETTAGE (D&C) /HYSTEROSCOPY;  Surgeon: Jonnie Kind, MD;  [facility];  Service: Gynecology;  Laterality: N/A;  With Suction Curette  . KNEE ARTHROPLASTY Left 03/15/2015   Procedure: COMPUTER ASSISTED TOTAL KNEE ARTHROPLASTY;  Surgeon: Marybelle Killings, MD;  Location: [facility];  Service: Orthopedics;  Laterality: Left;  . SALPINGOOPHORECTOMY  09/12/2011   Procedure: SALPINGO OOPHERECTOMY;  Surgeon: Janie Morning, MD;  [facility];  Service: Gynecology;  Laterality: Bilateral;     Current Outpatient Prescriptions:  .  albuterol (PROVENTIL HFA;VENTOLIN HFA) 108 (90 Base) MCG/ACT inhaler, Inhale 2 puffs into the lungs every 6 (six) hours as needed for wheezing or shortness of breath. Reported on 02/07/2016, Disp: 1 Inhaler, Rfl: 0 .  aspirin EC 325 MG EC tablet, Take 1 tablet (325 mg total) by mouth daily with breakfast., Disp: 30 tablet, Rfl: 0 .  atenolol (TENORMIN) 50 MG tablet, Take 25 mg by mouth 2 (two) times daily. , Disp: , Rfl:  .  cloNIDine (CATAPRES) 0.3 MG tablet, Take 0.15 mg by mouth 2 (two) times daily. , Disp: , Rfl:  .  diclofenac (VOLTAREN) 75 MG EC tablet, Take 1 tablet (75 mg total) by mouth 2 (two) times daily., Disp: 60 tablet, Rfl: 3 .  diltiazem (CARDIZEM CD) 180 MG 24 hr capsule, Take 180 mg by mouth at bedtime. , Disp: , Rfl:  .  docusate sodium (COLACE) 100 MG capsule, Take 100 mg by mouth as needed. Reported on 02/07/2016, Disp: , Rfl:  .  esomeprazole (NEXIUM) 40 MG capsule, Take 1 capsule (40 mg total) by mouth every morning. (Patient taking differently: Take 40 mg by mouth at bedtime. ),  Disp: 30 capsule, Rfl: 11 .  hydroxyurea (HYDREA) 500 MG capsule, Take 1 capsule (500 mg total) by mouth 2 (two) times daily. May take with food to minimize GI side effects., Disp: 180 capsule, Rfl: 1 .  losartan (COZAAR) 100 MG tablet, Take 100 mg by mouth daily. , Disp: , Rfl:  .  Simethicone (GAS RELIEF PO), Take 1 tablet by mouth as needed (gas). , Disp: , Rfl:  .  simvastatin (ZOCOR) 10 MG tablet, Take 10 mg by mouth daily after supper., Disp: , Rfl:  .  traZODone (DESYREL) 50 MG tablet, Take 1 tablet by mouth at bedtime., Disp: , Rfl:   Review of Systems Constitutional: negative Gastrointestinal: negative Genitourinary: negative  Objective:  BP 140/60 (BP Location: Right Arm, Patient Position: Sitting, Cuff Size: Large)   Pulse 66   Wt 182 lb 3.2 oz (82.6 kg)   BMI 31.27 kg/m    BMI: Body mass index is 31.27 kg/m. wight is more like 282 General Appearance: Alert, appropriate appearance for age. No acute distress HEENT: Grossly normal Neck / Thyroid:  Cardiovascular: RRR; normal S1, S2, no murmur Lungs: CTA bilaterally Back: No CVAT Breast Exam:  Gastrointestinal: Soft, non-tender, no masses or organomegaly exam limited by obesity Pelvic Exam: Vulva and vagina appear normal. Bimanual exam reveals normal uterus and adnexa. Rectovaginal: normal rectal, no masses Lymphatic Exam: Non-palpable nodes in neck, clavicular, axillary, or inguinal regions Skin: no rash or abnormalities Neurologic: Normal gait and speech, no tremor  Psychiatric: Alert and oriented, appropriate affect.  Urinalysis:normal and Not done  Mallory Shirk. MD Pgr [phone] 11:10 AM

## 2016-10-25 LAB — CYTOLOGY - PAP
DIAGNOSIS: NEGATIVE
HPV (WINDOPATH): NOT DETECTED

## 2016-10-28 ENCOUNTER — Ambulatory Visit
Admission: RE | Admit: 2016-10-28 | Discharge: 2016-10-28 | Disposition: A | Discharge status: Home / Self Care | Payer: Medicare Other | Source: Ambulatory Visit | Attending: Internal Medicine | Admitting: Internal Medicine

## 2016-10-28 DIAGNOSIS — M545 Low back pain, unspecified: Secondary | ICD-10-CM

## 2016-10-28 DIAGNOSIS — M48061 Spinal stenosis, lumbar region without neurogenic claudication: Secondary | ICD-10-CM | POA: Diagnosis not present

## 2016-11-01 DIAGNOSIS — M545 Low back pain: Secondary | ICD-10-CM | POA: Diagnosis not present

## 2016-12-14 ENCOUNTER — Other Ambulatory Visit (HOSPITAL_COMMUNITY): Payer: Self-pay | Admitting: Oncology

## 2017-01-10 DIAGNOSIS — R011 Cardiac murmur, unspecified: Secondary | ICD-10-CM | POA: Diagnosis not present

## 2017-01-10 DIAGNOSIS — R0602 Shortness of breath: Secondary | ICD-10-CM | POA: Diagnosis not present

## 2017-01-10 DIAGNOSIS — M545 Low back pain: Secondary | ICD-10-CM | POA: Diagnosis not present

## 2017-01-10 DIAGNOSIS — R600 Localized edema: Secondary | ICD-10-CM | POA: Diagnosis not present

## 2017-01-10 DIAGNOSIS — R0789 Other chest pain: Secondary | ICD-10-CM | POA: Diagnosis not present

## 2017-01-16 DIAGNOSIS — R609 Edema, unspecified: Secondary | ICD-10-CM | POA: Diagnosis not present

## 2017-01-16 DIAGNOSIS — R05 Cough: Secondary | ICD-10-CM | POA: Diagnosis not present

## 2017-01-16 DIAGNOSIS — M545 Low back pain: Secondary | ICD-10-CM | POA: Diagnosis not present

## 2017-03-02 ENCOUNTER — Other Ambulatory Visit (HOSPITAL_COMMUNITY): Payer: Self-pay

## 2017-03-02 ENCOUNTER — Ambulatory Visit (HOSPITAL_COMMUNITY): Payer: Self-pay | Admitting: Adult Health

## 2017-03-05 ENCOUNTER — Other Ambulatory Visit (HOSPITAL_COMMUNITY): Payer: Self-pay | Admitting: *Deleted

## 2017-03-05 DIAGNOSIS — C541 Malignant neoplasm of endometrium: Secondary | ICD-10-CM

## 2017-03-05 NOTE — Progress Notes (Signed)
[name], [name] [id number]   CLINIC:  Medical Oncology/Hematology  PCP:  [name], [place] [address] [phone]   REASON FOR VISIT:  Follow-up for Essential thrombocytosis, JAK2+ myeloproliferative disorder  CURRENT THERAPY: Hydrea 1000 mg daily     INTERVAL HISTORY:  [name] 76 y.o. female returns for follow-up for essential thrombocytosis in the setting of JAK2+ myeloproliferative disorder.   She remains on Hydrea 1000 mg daily; she takes as directed and has not missed any doses. She is tolerating without complaints, aside from fatigue. Her energy levels are 25%. Appetite is excellent at 100%.    Reports chronic dyspnea on exertion with cough; she attributes much of this to COPD and asthma. She feels that her shortness of breath is mostly stable and not any worse.  She also has bilateral lower extremity edema, with left worse than right.  Swelling improves with elevation of her legs and is worse with walking/standing.  She was told she "may need a fluid pill." She is asking if I would give her a prescription for a diuretic today.  She has chronic back pain, which is worse with standing. She recently had MRI spine ordered by her PCP.  She was told "that it wasn't bad enough for surgery or injections or anything right now."     REVIEW OF SYSTEMS:  Review of Systems  Constitutional: Positive for fatigue. Negative for chills and fever.  HENT:  Negative.  Negative for lump/mass and nosebleeds.   Eyes: Negative.   Respiratory: Positive for cough and shortness of breath.   Cardiovascular: Positive for leg swelling. Negative for chest pain.  Gastrointestinal: Negative.  Negative for abdominal pain, blood in stool, constipation, diarrhea, nausea and vomiting.  Endocrine: Negative.   Genitourinary: Negative.  Negative for dysuria and hematuria.   Musculoskeletal: Positive for arthralgias and back pain.  Skin:  Negative.  Negative for rash.  Neurological: Negative.  Negative for dizziness and headaches.  Hematological: Negative.  Negative for adenopathy. Does not bruise/bleed easily.  Psychiatric/Behavioral: Negative.  Negative for depression and sleep disturbance. The patient is not nervous/anxious.      PAST MEDICAL/SURGICAL HISTORY:  Past Medical History:  Diagnosis Date  . Anemia 04/02/2015  . Arthritis   . Atypical glandular cells on vaginal Papanicolaou smear 04/12/2016   Well get colpo and biopsy with JVF  . Bronchitis, chronic obstructive ([place]) since 2000's  . Cancer [facility])    dx with endometrial cancer   . Dysrhythmia   . GERD (gastroesophageal reflux disease)   . Gout   . Heart murmur   . Hiatal hernia   . Hiatal hernia 11/19/2012  . History of endometrial cancer 04/06/2016   Sp hysterectomy 2012  . Hypertension   . Myeloproliferative disorder, JAK-2 positive 01/05/2014  . Obesity   . Obesity, morbid (more than 100 lbs over ideal weight or BMI > 40) (HCC) 08/2011   Ht 5'7", wt 280 lb  . Pneumonia 09/2014  . Polycythemia vera(238.4) 01/18/2014  . PONV (postoperative nausea and vomiting)    post-op hypoxic resp failure 09/2011 and require Bi-Pap (possible r/t - pressure pulm edema vs asp PNA, COPD exacerbation, obesity hypoventilation  . Shortness of breath    unable to lay flat due to dyspnea  . Vaginal Pap smear, abnormal    Past Surgical History:  Procedure Laterality Date  . ABDOMINAL HYSTERECTOMY  09/12/2011   Procedure: HYSTERECTOMY ABDOMINAL;  Surgeon: Janie Morning, MD;  Location: [facility];  Service: Gynecology;  Laterality: N/A;  Total Abdominal Hysterectomy, Bilateral Salpingo Oophorectomy  . COLONOSCOPY  07/05/2012   Procedure: COLONOSCOPY;  Surgeon: Rogene Houston, MD;  Location: [facility];  Service: Endoscopy;  Laterality: N/A;  730  . FLEXIBLE SIGMOIDOSCOPY N/A 04/03/2015   Procedure: FLEXIBLE SIGMOIDOSCOPY;  Surgeon: Rogene Houston, MD;  Location: [facility];   Service: Endoscopy;  Laterality: N/A;  . HYSTEROSCOPY W/D&C  08/15/2011   Procedure: DILATATION AND CURETTAGE (D&C) /HYSTEROSCOPY;  Surgeon: Jonnie Kind, MD;  Location: [facility];  Service: Gynecology;  Laterality: N/A;  With Suction Curette  . KNEE ARTHROPLASTY Left 03/15/2015   Procedure: COMPUTER ASSISTED TOTAL KNEE ARTHROPLASTY;  Surgeon: Marybelle Killings, MD;  Location: [facility];  Service: Orthopedics;  Laterality: Left;  . SALPINGOOPHORECTOMY  09/12/2011   Procedure: SALPINGO OOPHERECTOMY;  Surgeon: Janie Morning, MD;  Location: [facility];  Service: Gynecology;  Laterality: Bilateral;     SOCIAL HISTORY:  Social History   Social History  . Marital status: Married    Spouse name: N/A  . Number of children: N/A  . Years of education: N/A   Occupational History  . Not on file.   Social History Main Topics  . Smoking status: Never Smoker  . Smokeless tobacco: Never Used  . Alcohol use No  . Drug use: No  . Sexual activity: No     Comment: hysterectomy   Other Topics Concern  . Not on file   Social History Narrative  . No narrative on file    FAMILY HISTORY:  Family History  Problem Relation Age of Onset  . Other Mother   . Cancer Father   . Hypertension Son   . Anesthesia problems Neg Hx   . Hypotension Neg Hx   . Malignant hyperthermia Neg Hx   . Pseudochol deficiency Neg Hx     CURRENT MEDICATIONS:  Outpatient Encounter Prescriptions as of 03/06/2017  Medication Sig  . albuterol (PROVENTIL HFA;VENTOLIN HFA) 108 (90 Base) MCG/ACT inhaler Inhale 2 puffs into the lungs every 6 (six) hours as needed for wheezing or shortness of breath. Reported on 02/07/2016  . aspirin EC 325 MG EC tablet Take 1 tablet (325 mg total) by mouth daily with breakfast.  . atenolol (TENORMIN) 50 MG tablet Take 25 mg by mouth 2 (two) times daily.   . cloNIDine (CATAPRES) 0.3 MG tablet Take 0.15 mg by mouth 2 (two) times daily.   . diclofenac (VOLTAREN) 75 MG EC tablet Take 1 tablet (75 mg total) by  mouth 2 (two) times daily.  Marland Kitchen diltiazem (CARDIZEM CD) 180 MG 24 hr capsule Take 180 mg by mouth at bedtime.   . docusate sodium (COLACE) 100 MG capsule Take 100 mg by mouth as needed. Reported on 02/07/2016  . esomeprazole (NEXIUM) 40 MG capsule Take 1 capsule (40 mg total) by mouth every morning. (Patient taking differently: Take 40 mg by mouth at bedtime. )  . hydroxyurea (HYDREA) 500 MG capsule TAKE TWO CAPSULES BY MOUTH ONCE DAILY  . losartan (COZAAR) 100 MG tablet Take 100 mg by mouth daily.   . Simethicone (GAS RELIEF PO) Take 1 tablet by mouth as needed (gas).   . simvastatin (ZOCOR) 10 MG tablet Take 10 mg by mouth daily after supper.  . traZODone (DESYREL) 50 MG tablet Take 1 tablet by mouth at bedtime.   No facility-administered encounter medications on file as of 03/06/2017.     ALLERGIES:  Allergies  Allergen  Reactions  . Carbamazepine Hives and Other (See Comments)    headache  . Sulfa Antibiotics     Rash     PHYSICAL EXAM:  ECOG Performance status: 2 - Symptomatic; requires assistance. Requires frequent rest breaks when performing ADLs.   Vitals:   03/06/17 1319  BP: 140/61  Pulse: 67  Resp: (!) 24  Temp: 98 F (36.7 C)   Filed Weights   03/06/17 1319  Weight: 283 lb 3.2 oz (128.5 kg)    Physical Exam  Constitutional: She is oriented to person, place, and time and well-developed, well-nourished, and in no distress.  Patient unable to step up onto exam table; examination done with patient standing.   HENT:  Head: Normocephalic.  Mouth/Throat: Oropharynx is clear and moist. No oropharyngeal exudate.  Eyes: Conjunctivae are normal. Pupils are equal, round, and reactive to light. No scleral icterus.  Neck: Normal range of motion. Neck supple.  Cardiovascular: Normal rate.   Murmur (chronic per patient ) heard. Pulmonary/Chest: Effort normal and breath sounds normal. No respiratory distress. She has no rales.  Abdominal: Soft. Bowel sounds are normal. There is  no tenderness. There is no rebound and no guarding.  Musculoskeletal: She exhibits edema.  -2+ BLE edema (left worse than right).  -LE weakness bilaterally (unable to step up onto exam table) and complains of back pain with standing as well.   Lymphadenopathy:    She has no cervical adenopathy.  Neurological: She is alert and oriented to person, place, and time.  Skin: Skin is warm and dry. No rash noted.  Psychiatric: Mood, memory, affect and judgment normal.  Nursing note and vitals reviewed.    LABORATORY DATA:  I have reviewed the labs as listed.  CBC    Component Value Date/Time   WBC 6.0 03/06/2017 1254   RBC 3.64 (L) 03/06/2017 1254   HGB 13.9 03/06/2017 1254   HCT 42.1 03/06/2017 1254   PLT 227 03/06/2017 1254   MCV 115.7 (H) 03/06/2017 1254   MCH 38.2 (H) 03/06/2017 1254   MCHC 33.0 03/06/2017 1254   RDW 13.4 03/06/2017 1254   LYMPHSABS 2.0 03/06/2017 1254   MONOABS 0.5 03/06/2017 1254   EOSABS 0.1 03/06/2017 1254   BASOSABS 0.0 03/06/2017 1254   CMP Latest Ref Rng & Units 03/06/2017 08/31/2016 02/07/2016  Glucose 65 - 99 mg/dL 120(H) 102(H) 90  BUN 6 - 20 mg/dL 22(H) 16 22(H)  Creatinine 0.44 - 1.00 mg/dL 0.79 0.68 0.60  Sodium 135 - 145 mmol/L 139 136 140  Potassium 3.5 - 5.1 mmol/L 4.5 4.3 4.2  Chloride 101 - 111 mmol/L 102 99(L) 103  CO2 22 - 32 mmol/L 32 32 30  Calcium 8.9 - 10.3 mg/dL 9.3 9.3 9.0  Total Protein 6.5 - 8.1 g/dL 7.4 7.6 7.5  Total Bilirubin 0.3 - 1.2 mg/dL 0.8 0.6 0.6  Alkaline Phos 38 - 126 U/L 79 76 77  AST 15 - 41 U/L 20 24 20   ALT 14 - 54 U/L 17 21 16     PENDING LABS:    DIAGNOSTIC IMAGING:    PATHOLOGY:     ASSESSMENT & PLAN:   Essential thrombocytosis, JAK2+ myeloproliferative disorder:  -Remains on Hydrea 1000 mg daily. Platelet count today normal at 227,000 today.  -Recommended continue current dose of Hydrea. Given JAK2 positivity, lifelong Hydrea therapy is anticipated. -Will check CBC with diff every 2 months to ensure  stability of platelet count.  -Return to cancer center in 6 months with labs for  continued follow-up.   Lower extremity edema, left > right:  -Chronic x at least 4 years. History of left knee replacement.  Did have (L) LE ultrasound to rule out DVT, which was negative on 04/15/15.  She denies any calf pain or skin changes.  While she is certainly at increased risk of DVT given myeloproliferative disorder, her platelet count is well-controlled on current dose of Hydrea. I have low clinical suspicion for DVT.  -Recommended she follow-up with her PCP for diuretic requests. Explained that diuretics require more frequent lab monitoring than we do for her condition here at the cancer center.  Shared that I would feel more comfortable with her PCP helping manage her LE edema, since there is low clinical suspicion that the edema is related to her myeloproliferative disorder or treatment with Hydrea.  She agreed with this plan.    Shortness of breath:  -Chronic dyspnea on exertion with intermittent cough; not worsened per patient and has been present for many years. She does have h/o restrictive and obstructive airway disease with asthma and COPD per patient.  -Again, based on her compliance with Hydrea and normal platelet counts, as well as the chronicity of her dyspnea symptoms, I have low suspicion for VTE/PE as the etiology of her shortness of breath. Will defer continued management to her PCP as clinically indicated.   Endometrial cancer:  -Treated definitively with hysterectomy; no vaginal bleeding or abdominal pain. No clinical symptoms of recurrence.   Health maintenance:  -Mammogram is overdue; orders placed today.  -Recommended continued follow-up with PCP for continued health maintenance exams, vaccinations, and cancer screenings.     Dispo:  -Mammogram overdue; orders placed today.  -Labs every other month (CBC with diff); standing orders placed.  -Return to cancer center in 6 months with  labs (CBC with diff, CMET); orders placed.    All questions were answered to patient's stated satisfaction. Encouraged patient to call with any new concerns or questions before her next visit to the cancer center and we can certain see her sooner, if needed.    Plan of care discussed with Dr. Talbert Cage, who agrees with the above aforementioned.    Orders placed this encounter:  Orders Placed This Encounter  Procedures  . MM SCREENING BREAST TOMO BILATERAL  . CBC with Differential/Platelet  . CBC with Differential/Platelet  . Comprehensive metabolic panel      Mike Craze, NP [place] [phone]

## 2017-03-06 ENCOUNTER — Encounter (HOSPITAL_BASED_OUTPATIENT_CLINIC_OR_DEPARTMENT_OTHER): Payer: Medicare Other | Admitting: Adult Health

## 2017-03-06 ENCOUNTER — Encounter (HOSPITAL_COMMUNITY): Payer: Self-pay | Admitting: Adult Health

## 2017-03-06 ENCOUNTER — Encounter (HOSPITAL_COMMUNITY): Payer: Medicare Other | Attending: Oncology

## 2017-03-06 VITALS — BP 140/61 | HR 67 | Temp 98.0°F | Resp 24 | Wt 283.2 lb

## 2017-03-06 DIAGNOSIS — D473 Essential (hemorrhagic) thrombocythemia: Secondary | ICD-10-CM | POA: Insufficient documentation

## 2017-03-06 DIAGNOSIS — Z90710 Acquired absence of both cervix and uterus: Secondary | ICD-10-CM | POA: Insufficient documentation

## 2017-03-06 DIAGNOSIS — J449 Chronic obstructive pulmonary disease, unspecified: Secondary | ICD-10-CM | POA: Diagnosis not present

## 2017-03-06 DIAGNOSIS — Z79899 Other long term (current) drug therapy: Secondary | ICD-10-CM | POA: Diagnosis not present

## 2017-03-06 DIAGNOSIS — K219 Gastro-esophageal reflux disease without esophagitis: Secondary | ICD-10-CM | POA: Insufficient documentation

## 2017-03-06 DIAGNOSIS — Z1231 Encounter for screening mammogram for malignant neoplasm of breast: Secondary | ICD-10-CM

## 2017-03-06 DIAGNOSIS — Z8542 Personal history of malignant neoplasm of other parts of uterus: Secondary | ICD-10-CM | POA: Insufficient documentation

## 2017-03-06 DIAGNOSIS — Z8544 Personal history of malignant neoplasm of other female genital organs: Secondary | ICD-10-CM | POA: Diagnosis not present

## 2017-03-06 DIAGNOSIS — D471 Chronic myeloproliferative disease: Secondary | ICD-10-CM

## 2017-03-06 DIAGNOSIS — R600 Localized edema: Secondary | ICD-10-CM | POA: Insufficient documentation

## 2017-03-06 DIAGNOSIS — Z7982 Long term (current) use of aspirin: Secondary | ICD-10-CM | POA: Insufficient documentation

## 2017-03-06 DIAGNOSIS — Z809 Family history of malignant neoplasm, unspecified: Secondary | ICD-10-CM | POA: Diagnosis not present

## 2017-03-06 DIAGNOSIS — Z6841 Body Mass Index (BMI) 40.0 and over, adult: Secondary | ICD-10-CM | POA: Diagnosis not present

## 2017-03-06 DIAGNOSIS — I10 Essential (primary) hypertension: Secondary | ICD-10-CM | POA: Insufficient documentation

## 2017-03-06 DIAGNOSIS — C541 Malignant neoplasm of endometrium: Secondary | ICD-10-CM

## 2017-03-06 LAB — COMPREHENSIVE METABOLIC PANEL
ALBUMIN: 3.8 g/dL (ref 3.5–5.0)
ALT: 17 U/L (ref 14–54)
AST: 20 U/L (ref 15–41)
Alkaline Phosphatase: 79 U/L (ref 38–126)
Anion gap: 5 (ref 5–15)
BILIRUBIN TOTAL: 0.8 mg/dL (ref 0.3–1.2)
BUN: 22 mg/dL — AB (ref 6–20)
CHLORIDE: 102 mmol/L (ref 101–111)
CO2: 32 mmol/L (ref 22–32)
Calcium: 9.3 mg/dL (ref 8.9–10.3)
Creatinine, Ser: 0.79 mg/dL (ref 0.44–1.00)
GFR calc Af Amer: >60 mL/min (ref >60)
GFR calc non Af Amer: >60 mL/min (ref >60)
GLUCOSE: 120 mg/dL — AB (ref 65–99)
POTASSIUM: 4.5 mmol/L (ref 3.5–5.1)
Sodium: 139 mmol/L (ref 135–145)
Total Protein: 7.4 g/dL (ref 6.5–8.1)

## 2017-03-06 LAB — CBC WITH DIFFERENTIAL/PLATELET
BASOS ABS: 0 10*3/uL (ref 0.0–0.1)
BASOS PCT: 0 %
EOS ABS: 0.1 10*3/uL (ref 0.0–0.7)
EOS PCT: 2 %
HCT: 42.1 % (ref 36.0–46.0)
Hemoglobin: 13.9 g/dL (ref 12.0–15.0)
Lymphocytes Relative: 33 %
Lymphs Abs: 2 10*3/uL (ref 0.7–4.0)
MCH: 38.2 pg — ABNORMAL HIGH (ref 26.0–34.0)
MCHC: 33 g/dL (ref 30.0–36.0)
MCV: 115.7 fL — ABNORMAL HIGH (ref 78.0–100.0)
MONO ABS: 0.5 10*3/uL (ref 0.1–1.0)
Monocytes Relative: 8 %
NEUTROS ABS: 3.4 10*3/uL (ref 1.7–7.7)
NEUTROS PCT: 57 %
Platelets: 227 10*3/uL (ref 150–400)
RBC: 3.64 MIL/uL — AB (ref 3.87–5.11)
RDW: 13.4 % (ref 11.5–15.5)
WBC: 6 10*3/uL (ref 4.0–10.5)

## 2017-03-06 NOTE — Patient Instructions (Addendum)
[place] at [facility] Discharge Instructions  RECOMMENDATIONS MADE BY THE CONSULTANT AND ANY TEST RESULTS WILL BE SENT TO YOUR REFERRING PHYSICIAN.  You were seen today by Mike Craze, NP Call your Primary Care Doctor to discuss being put on fluid pills.  Mammogram ordered. Labs every 2 months. Return in 6 months for labs and follow up.     Thank you for choosing [place] at [facility] to provide your oncology and hematology care.  To afford each patient quality time with our provider, please arrive at least 15 minutes before your scheduled appointment time.    If you have a lab appointment with the [facility] please come in thru the  Main Entrance and check in at the main information desk  You need to re-schedule your appointment should you arrive 10 or more minutes late.  We strive to give you quality time with our providers, and arriving late affects you and other patients whose appointments are after yours.  Also, if you no show three or more times for appointments you may be dismissed from the clinic at the providers discretion.     Again, thank you for choosing [facility].  Our hope is that these requests will decrease the amount of time that you wait before being seen by our physicians.       _____________________________________________________________  Should you have questions after your visit to [facility], please contact our office at [phone] between the hours of 8:30 a.m. and 4:30 p.m.  Voicemails left after 4:30 p.m. will not be returned until the following business day.  For prescription refill requests, have your pharmacy contact our office.       Resources For Cancer Patients and their Caregivers ? American Cancer Society: Can assist with transportation, wigs, general needs, runs Look Good Feel Better.        6414848718 ? Cancer Care: Provides financial assistance,  online support groups, medication/co-pay assistance.  1-800-813-HOPE 709 174 7271) ? [place] [facility] Assists [place] cancer patients and their families through emotional , educational and financial support.  (336) 511-0665 ? [place] DSS Where to apply for food stamps, Medicaid and utility assistance. 7023532678 ? [facility]: Transportation to medical appointments. 228-470-8074 ? Social Security Administration: May apply for disability if have a Stage IV cancer. 765-761-7829 (815) 590-1949 ? [employer], Disability and Transit Services: Assists with nutrition, care and transit needs. [place] Support Programs: @10RELATIVEDAYS @ > Cancer Support Group  2nd Tuesday of the month 1pm-2pm, Journey Room  > Creative Journey  3rd Tuesday of the month 1130am-1pm, Journey Room  > Look Good Feel Better  1st Wednesday of the month 10am-12 noon, Journey Room (Call [facility] to register [phone])

## 2017-03-09 ENCOUNTER — Other Ambulatory Visit (HOSPITAL_COMMUNITY): Payer: Self-pay | Admitting: Adult Health

## 2017-03-09 DIAGNOSIS — Z1231 Encounter for screening mammogram for malignant neoplasm of breast: Secondary | ICD-10-CM

## 2017-03-29 ENCOUNTER — Ambulatory Visit (HOSPITAL_COMMUNITY): Payer: Self-pay

## 2017-05-04 ENCOUNTER — Ambulatory Visit (HOSPITAL_COMMUNITY)
Admission: RE | Admit: 2017-05-04 | Discharge: 2017-05-04 | Disposition: A | Discharge status: Home / Self Care | Payer: Medicare Other | Source: Ambulatory Visit | Attending: Adult Health | Admitting: Adult Health

## 2017-05-04 ENCOUNTER — Encounter (HOSPITAL_COMMUNITY): Payer: Medicare Other | Attending: Oncology

## 2017-05-04 DIAGNOSIS — D471 Chronic myeloproliferative disease: Secondary | ICD-10-CM | POA: Diagnosis not present

## 2017-05-04 DIAGNOSIS — Z1231 Encounter for screening mammogram for malignant neoplasm of breast: Secondary | ICD-10-CM | POA: Diagnosis not present

## 2017-05-04 LAB — CBC WITH DIFFERENTIAL/PLATELET
Basophils Absolute: 0 10*3/uL (ref 0.0–0.1)
Basophils Relative: 0 %
EOS ABS: 0.1 10*3/uL (ref 0.0–0.7)
Eosinophils Relative: 2 %
HCT: 40.7 % (ref 36.0–46.0)
Hemoglobin: 13.7 g/dL (ref 12.0–15.0)
LYMPHS PCT: 26 %
Lymphs Abs: 1.8 10*3/uL (ref 0.7–4.0)
MCH: 38.4 pg — ABNORMAL HIGH (ref 26.0–34.0)
MCHC: 33.7 g/dL (ref 30.0–36.0)
MCV: 114 fL — ABNORMAL HIGH (ref 78.0–100.0)
MONO ABS: 0.5 10*3/uL (ref 0.1–1.0)
Monocytes Relative: 7 %
NEUTROS PCT: 65 %
Neutro Abs: 4.4 10*3/uL (ref 1.7–7.7)
Platelets: 215 10*3/uL (ref 150–400)
RBC: 3.57 MIL/uL — AB (ref 3.87–5.11)
RDW: 13 % (ref 11.5–15.5)
WBC: 6.8 10*3/uL (ref 4.0–10.5)

## 2017-05-07 ENCOUNTER — Other Ambulatory Visit: Payer: Self-pay | Admitting: Adult Health

## 2017-05-07 DIAGNOSIS — N6320 Unspecified lump in the left breast, unspecified quadrant: Secondary | ICD-10-CM

## 2017-05-07 DIAGNOSIS — R229 Localized swelling, mass and lump, unspecified: Principal | ICD-10-CM

## 2017-05-07 DIAGNOSIS — IMO0002 Reserved for concepts with insufficient information to code with codable children: Secondary | ICD-10-CM

## 2017-05-08 DIAGNOSIS — G4762 Sleep related leg cramps: Secondary | ICD-10-CM | POA: Diagnosis not present

## 2017-05-08 DIAGNOSIS — J209 Acute bronchitis, unspecified: Secondary | ICD-10-CM | POA: Diagnosis not present

## 2017-05-12 DIAGNOSIS — R252 Cramp and spasm: Secondary | ICD-10-CM | POA: Diagnosis not present

## 2017-05-15 ENCOUNTER — Encounter (HOSPITAL_COMMUNITY): Payer: Self-pay

## 2017-05-22 ENCOUNTER — Ambulatory Visit (HOSPITAL_COMMUNITY)
Admission: RE | Admit: 2017-05-22 | Discharge: 2017-05-22 | Disposition: A | Discharge status: Home / Self Care | Payer: Medicare Other | Source: Ambulatory Visit | Attending: Adult Health | Admitting: Adult Health

## 2017-05-22 ENCOUNTER — Other Ambulatory Visit: Payer: Self-pay | Admitting: Adult Health

## 2017-05-22 DIAGNOSIS — N6320 Unspecified lump in the left breast, unspecified quadrant: Secondary | ICD-10-CM

## 2017-05-22 DIAGNOSIS — IMO0002 Reserved for concepts with insufficient information to code with codable children: Secondary | ICD-10-CM

## 2017-05-22 DIAGNOSIS — N6321 Unspecified lump in the left breast, upper outer quadrant: Secondary | ICD-10-CM | POA: Insufficient documentation

## 2017-05-22 DIAGNOSIS — R229 Localized swelling, mass and lump, unspecified: Secondary | ICD-10-CM

## 2017-05-22 DIAGNOSIS — N6489 Other specified disorders of breast: Secondary | ICD-10-CM | POA: Diagnosis not present

## 2017-05-22 DIAGNOSIS — R928 Other abnormal and inconclusive findings on diagnostic imaging of breast: Secondary | ICD-10-CM | POA: Diagnosis not present

## 2017-05-29 ENCOUNTER — Other Ambulatory Visit (HOSPITAL_COMMUNITY): Payer: Self-pay | Admitting: Internal Medicine

## 2017-05-29 ENCOUNTER — Ambulatory Visit (HOSPITAL_COMMUNITY)
Admission: RE | Admit: 2017-05-29 | Discharge: 2017-05-29 | Disposition: A | Discharge status: Home / Self Care | Payer: Medicare Other | Source: Ambulatory Visit | Attending: Internal Medicine | Admitting: Internal Medicine

## 2017-05-29 ENCOUNTER — Ambulatory Visit (HOSPITAL_COMMUNITY)
Admission: RE | Admit: 2017-05-29 | Discharge: 2017-05-29 | Disposition: A | Discharge status: Home / Self Care | Payer: Medicare Other | Source: Ambulatory Visit | Attending: Adult Health | Admitting: Adult Health

## 2017-05-29 DIAGNOSIS — N6320 Unspecified lump in the left breast, unspecified quadrant: Secondary | ICD-10-CM | POA: Diagnosis present

## 2017-05-29 DIAGNOSIS — N6321 Unspecified lump in the left breast, upper outer quadrant: Secondary | ICD-10-CM | POA: Diagnosis not present

## 2017-05-29 DIAGNOSIS — C50412 Malignant neoplasm of upper-outer quadrant of left female breast: Secondary | ICD-10-CM | POA: Diagnosis not present

## 2017-05-29 DIAGNOSIS — IMO0002 Reserved for concepts with insufficient information to code with codable children: Secondary | ICD-10-CM

## 2017-05-29 DIAGNOSIS — R229 Localized swelling, mass and lump, unspecified: Principal | ICD-10-CM

## 2017-05-29 MED ORDER — LIDOCAINE HCL (PF) 1 % IJ SOLN
INTRAMUSCULAR | Status: AC
  Administered 2017-05-29: 5 mL
  Filled 2017-05-29: qty 5

## 2017-05-29 MED ORDER — LIDOCAINE-EPINEPHRINE (PF) 1 %-1:200000 IJ SOLN
INTRAMUSCULAR | Status: AC
  Administered 2017-05-29: 5 mL
  Filled 2017-05-29: qty 30

## 2017-05-31 ENCOUNTER — Telehealth: Payer: Self-pay | Admitting: *Deleted

## 2017-06-01 NOTE — Telephone Encounter (Signed)
[name] calls to let me know that she's been diagnosed as having breast cancer. Review of the pathology shows its ER positive PR negative on initial testing. She asked for names in [place], and I have given her the name of Dr. Donne Hazel and Dr. Barry Dienes at [facility] surgery she'll keep me informed of the progress in her care

## 2017-06-07 ENCOUNTER — Other Ambulatory Visit (INDEPENDENT_AMBULATORY_CARE_PROVIDER_SITE_OTHER): Payer: Self-pay | Admitting: Orthopaedic Surgery

## 2017-06-07 DIAGNOSIS — M1990 Unspecified osteoarthritis, unspecified site: Secondary | ICD-10-CM

## 2017-06-07 NOTE — Telephone Encounter (Signed)
Recommend return office visit. Has not been seen in a while. Also needs to come in for lab work due to chronic anti-inflammatory use

## 2017-06-07 NOTE — Telephone Encounter (Signed)
Can you advise since Dr  Lorin Mercy is out of the office? Thanks.

## 2017-06-08 DIAGNOSIS — C50412 Malignant neoplasm of upper-outer quadrant of left female breast: Secondary | ICD-10-CM | POA: Diagnosis not present

## 2017-06-13 ENCOUNTER — Telehealth (INDEPENDENT_AMBULATORY_CARE_PROVIDER_SITE_OTHER): Payer: Self-pay | Admitting: Orthopaedic Surgery

## 2017-06-13 NOTE — Telephone Encounter (Signed)
Called patient left message to return call to schedule appointment with Jeneen Rinks or Dr. Lorin Mercy   [phone]

## 2017-06-13 NOTE — Telephone Encounter (Signed)
Can we call her and make appt with Lorin Mercy or Karen Kitchens denied her med request since she hasn't been seen in some time  Thanks!

## 2017-06-14 ENCOUNTER — Encounter: Payer: Self-pay | Admitting: Radiation Oncology

## 2017-06-14 ENCOUNTER — Other Ambulatory Visit: Payer: Self-pay | Admitting: General Surgery

## 2017-06-14 DIAGNOSIS — Z170 Estrogen receptor positive status [ER+]: Principal | ICD-10-CM

## 2017-06-14 DIAGNOSIS — C50412 Malignant neoplasm of upper-outer quadrant of left female breast: Secondary | ICD-10-CM

## 2017-06-15 NOTE — Telephone Encounter (Signed)
Called patient she advised she will not be making any appointment now due to her going to be scheduled for surgery.

## 2017-06-18 NOTE — Progress Notes (Signed)
Location of Breast Cancer: left upper outer breast  Histology per Pathology Report:   05/29/17 Diagnosis Breast, left, needle core biopsy, upper outer, 2:30 distortion/mass, approx 1.0 cm on mammo and ultrasound in a region that used to have chronic cysts. - INVASIVE DUCTAL CARCINOMA, GRADE 1/2. - SEE MICROSCOPIC DESCRIPTION. Microscopic Comment Breast prognostic profile will be performed.  Receptor Status: ER(5%), PR (0%), Her2-neu (negative), Ki-(15%)  Did patient present with symptoms (if so, please note symptoms) or was this found on screening mammography?: screening mammogram  Past/Anticipated interventions by surgeon, if any:Dr. Donne Hazel, MD  Past/Anticipated interventions by medical oncology, if any: has an appointment with Mike Craze, NP at [facility] on 06/22/17.  Lymphedema issues, if any: feet    Pain issues, if any: No   SAFETY ISSUES:  Prior radiation? No  Pacemaker/ICD? No  Is the patient on methotrexate? NO  Current Complaints / other details:  Patient has a history of endometrial cancer - s/p hysterectomy.G5P5,no tobacco use on alcohol or drug use BP (!) 146/55 Comment: right arm sitting  Pulse 67   Temp 97.8 F (36.6 C) (Oral)   Resp 20   Ht 5' 4"  (1.626 m)   Wt 288 lb 6.4 oz (130.8 kg)   BMI 49.50 kg/m

## 2017-06-19 ENCOUNTER — Encounter: Payer: Self-pay | Admitting: Radiation Oncology

## 2017-06-19 ENCOUNTER — Ambulatory Visit
Admission: RE | Admit: 2017-06-19 | Discharge: 2017-06-19 | Disposition: A | Discharge status: Home / Self Care | Payer: Medicare Other | Source: Ambulatory Visit | Attending: Radiation Oncology | Admitting: Radiation Oncology

## 2017-06-19 VITALS — BP 146/55 | HR 67 | Temp 97.8°F | Resp 20 | Ht 64.0 in | Wt 288.4 lb

## 2017-06-19 DIAGNOSIS — R011 Cardiac murmur, unspecified: Secondary | ICD-10-CM | POA: Diagnosis not present

## 2017-06-19 DIAGNOSIS — Z8701 Personal history of pneumonia (recurrent): Secondary | ICD-10-CM | POA: Diagnosis not present

## 2017-06-19 DIAGNOSIS — C50412 Malignant neoplasm of upper-outer quadrant of left female breast: Secondary | ICD-10-CM | POA: Diagnosis not present

## 2017-06-19 DIAGNOSIS — K469 Unspecified abdominal hernia without obstruction or gangrene: Secondary | ICD-10-CM | POA: Diagnosis not present

## 2017-06-19 DIAGNOSIS — K219 Gastro-esophageal reflux disease without esophagitis: Secondary | ICD-10-CM | POA: Diagnosis not present

## 2017-06-19 DIAGNOSIS — Z170 Estrogen receptor positive status [ER+]: Secondary | ICD-10-CM | POA: Insufficient documentation

## 2017-06-19 DIAGNOSIS — I10 Essential (primary) hypertension: Secondary | ICD-10-CM | POA: Insufficient documentation

## 2017-06-19 DIAGNOSIS — Z8542 Personal history of malignant neoplasm of other parts of uterus: Secondary | ICD-10-CM | POA: Diagnosis not present

## 2017-06-19 DIAGNOSIS — D649 Anemia, unspecified: Secondary | ICD-10-CM | POA: Diagnosis not present

## 2017-06-19 NOTE — Progress Notes (Signed)
Radiation Oncology         [phone]) [phone] ________________________________  Name: [name]        MRN: [id number]  Date of Service: 06/19/2017 DOB: [birth year]  WE:[id number], Carloyn Manner, MD  Rolm Bookbinder, MD     REFERRING PHYSICIAN: Rolm Bookbinder, MD   DIAGNOSIS: The encounter diagnosis was Malignant neoplasm of upper-outer quadrant of left breast in female, estrogen receptor positive ([place]).   HISTORY OF PRESENT ILLNESS: [name] is a 76 y.o. female seen at the request of Dr. Donne Hazel for a new diagnosis of left breast cancer. She has a history of endometrial cancer treated surgically in 2012, and a history of JAK2+ myeloproliferative disorder and is followed for her bone marrow dysfunction by hematology/oncology at [facility]. She was encouraged to continue screening mammogram and this revealed a possible left breast mass when performed on 05/04/17. She returned for diagnostic imaging which revealed a 8 x 8 x 15 mm mass in the left breast at 2:30. A few cysts were also seen and no adenopathy was noted in the axilla.   A biopsy on 05/29/17 revealed a weakly ER positive (5%), PR and HER2 negative, grade 1-2 invasive ductal carcinoma of the left breast with Ki 67 of 15%.  She will meet with medical oncology this Friday in the [place] office. She comes today to discuss options of treatment for her cancer. Patient presents today with her daughter and husband.   PREVIOUS RADIATION THERAPY: No   PAST MEDICAL HISTORY:  Past Medical History:  Diagnosis Date  . Anemia 04/02/2015  . Arthritis   . Atypical glandular cells on vaginal Papanicolaou smear 04/12/2016   Well get colpo and biopsy with JVF  . Bronchitis, chronic obstructive ([place]) since 2000's  . Cancer [facility])    dx with endometrial cancer   . Dysrhythmia   . GERD (gastroesophageal reflux disease)   . Gout   . Heart murmur   . Hiatal hernia   . Hiatal hernia 11/19/2012  . History of endometrial cancer 04/06/2016   Sp  hysterectomy 2012  . Hypertension   . Myeloproliferative disorder, JAK-2 positive 01/05/2014  . Obesity   . Obesity, morbid (more than 100 lbs over ideal weight or BMI > 40) ([facility]) 08/2011   Ht 5'7", wt 280 lb  . Pneumonia 09/2014  . Polycythemia vera(238.4) 01/18/2014  . PONV (postoperative nausea and vomiting)    post-op hypoxic resp failure 09/2011 and require Bi-Pap (possible r/t - pressure pulm edema vs asp PNA, COPD exacerbation, obesity hypoventilation  . Shortness of breath    unable to lay flat due to dyspnea  . Vaginal Pap smear, abnormal        PAST SURGICAL HISTORY: Past Surgical History:  Procedure Laterality Date  . ABDOMINAL HYSTERECTOMY  09/12/2011   Procedure: HYSTERECTOMY ABDOMINAL;  Surgeon: Janie Morning, MD;  Location: [facility];  Service: Gynecology;  Laterality: N/A;  Total Abdominal Hysterectomy, Bilateral Salpingo Oophorectomy  . COLONOSCOPY  07/05/2012   Procedure: COLONOSCOPY;  Surgeon: Rogene Houston, MD;  Location: [facility];  Service: Endoscopy;  Laterality: N/A;  730  . FLEXIBLE SIGMOIDOSCOPY N/A 04/03/2015   Procedure: FLEXIBLE SIGMOIDOSCOPY;  Surgeon: Rogene Houston, MD;  Location: [facility];  Service: Endoscopy;  Laterality: N/A;  . HYSTEROSCOPY W/D&C  08/15/2011   Procedure: DILATATION AND CURETTAGE (D&C) /HYSTEROSCOPY;  Surgeon: Jonnie Kind, MD;  Location: [facility];  Service: Gynecology;  Laterality: N/A;  With Suction Curette  . KNEE ARTHROPLASTY  Left 03/15/2015   Procedure: COMPUTER ASSISTED TOTAL KNEE ARTHROPLASTY;  Surgeon: Marybelle Killings, MD;  Location: [place];  Service: Orthopedics;  Laterality: Left;  . SALPINGOOPHORECTOMY  09/12/2011   Procedure: SALPINGO OOPHERECTOMY;  Surgeon: Janie Morning, MD;  Location: [facility];  Service: Gynecology;  Laterality: Bilateral;     FAMILY HISTORY:  Family History  Problem Relation Age of Onset  . Other Mother   . Cancer Father   . Hypertension Son   . Anesthesia problems Neg Hx   . Hypotension Neg Hx     . Malignant hyperthermia Neg Hx   . Pseudochol deficiency Neg Hx      SOCIAL HISTORY:  reports that she has never smoked. She has never used smokeless tobacco. She reports that she does not drink alcohol or use drugs. The patient is married and lives in [place].   ALLERGIES: Carbamazepine and Sulfa antibiotics   MEDICATIONS:  Current Outpatient Prescriptions  Medication Sig Dispense Refill  . albuterol (PROVENTIL HFA;VENTOLIN HFA) 108 (90 Base) MCG/ACT inhaler Inhale 2 puffs into the lungs every 6 (six) hours as needed for wheezing or shortness of breath. Reported on 02/07/2016 1 Inhaler 0  . aspirin EC 81 MG tablet Take 81 mg by mouth daily.    Marland Kitchen atenolol (TENORMIN) 50 MG tablet Take 25 mg by mouth 2 (two) times daily.     . cloNIDine (CATAPRES) 0.3 MG tablet Take 0.15 mg by mouth 2 (two) times daily.     . diclofenac (VOLTAREN) 75 MG EC tablet Take 1 tablet (75 mg total) by mouth 2 (two) times daily. 60 tablet 3  . diltiazem (CARDIZEM CD) 180 MG 24 hr capsule Take 180 mg by mouth at bedtime.     Marland Kitchen esomeprazole (NEXIUM) 40 MG capsule Take 1 capsule (40 mg total) by mouth every morning. (Patient taking differently: Take 40 mg by mouth at bedtime. ) 30 capsule 11  . hydroxyurea (HYDREA) 500 MG capsule TAKE TWO CAPSULES BY MOUTH ONCE DAILY 180 capsule 1  . losartan (COZAAR) 100 MG tablet Take 100 mg by mouth daily.     . Simethicone (GAS RELIEF PO) Take 1 tablet by mouth as needed (gas).     . simvastatin (ZOCOR) 10 MG tablet Take 10 mg by mouth daily after supper.    . traZODone (DESYREL) 50 MG tablet Take 1 tablet by mouth at bedtime.    . docusate sodium (COLACE) 100 MG capsule Take 100 mg by mouth as needed. Reported on 02/07/2016     No current facility-administered medications for this encounter.      REVIEW OF SYSTEMS: On review of systems, the patient reports that she is doing well overall. She denies any chest pain, shortness of breath, cough, fevers, chills, night sweats,  unintended weight changes. She denies any bowel or bladder disturbances, and denies abdominal pain, nausea or vomiting. She denies any new musculoskeletal or joint aches or pains. A complete review of systems is obtained and is otherwise negative.     PHYSICAL EXAM:  Wt Readings from Last 3 Encounters:  06/19/17 288 lb 6.4 oz (130.8 kg)  03/06/17 283 lb 3.2 oz (128.5 kg)  10/20/16 182 lb 3.2 oz (82.6 kg)   Temp Readings from Last 3 Encounters:  06/19/17 97.8 F (36.6 C) (Oral)  05/29/17 98 F (36.7 C) (Oral)  03/06/17 98 F (36.7 C) (Oral)   BP Readings from Last 3 Encounters:  06/19/17 (!) 146/55  05/29/17 127/87  03/06/17 140/61  Pulse Readings from Last 3 Encounters:  06/19/17 67  05/29/17 60  03/06/17 67   Pain Assessment Pain Score: 0-No pain/10  In general this is a well appearing caucasian female in no acute distress. She is alert and oriented x4 and appropriate throughout the examination. HEENT reveals that the patient is normocephalic, atraumatic. EOMs are intact. PERRLA. Skin is intact without any evidence of gross lesions. Cardiovascular exam reveals a regular rate and rhythm, no clicks rubs or murmurs are auscultated. Chest is clear to auscultation bilaterally. Lymphatic assessment is performed and does not reveal any adenopathy in the cervical, supraclavicular, axillary, or inguinal chains. Bilateral breast exam reveals no palpable masses of either breast, no nipple bleeding or discharge is noted either.  Abdomen has active bowel sounds in all quadrants and is intact. The abdomen is soft, non tender, non distended. Lower extremities are negative for pretibial pitting edema, deep calf tenderness, cyanosis or clubbing.   ECOG = 0  0 - Asymptomatic (Fully active, able to carry on all predisease activities without restriction)  1 - Symptomatic but completely ambulatory (Restricted in physically strenuous activity but ambulatory and able to carry out work of a light or  sedentary nature. For example, light housework, office work)  2 - Symptomatic, <50% in bed during the day (Ambulatory and capable of all self care but unable to carry out any work activities. Up and about more than 50% of waking hours)  3 - Symptomatic, >50% in bed, but not bedbound (Capable of only limited self-care, confined to bed or chair 50% or more of waking hours)  4 - Bedbound (Completely disabled. Cannot carry on any self-care. Totally confined to bed or chair)  5 - Death   Eustace Pen MM, Creech RH, Tormey DC, et al. [phone]). "Toxicity and response criteria of the [facility] Group". [place] Oncol. 5 (6): 649-55    LABORATORY DATA:  Lab Results  Component Value Date   WBC 6.8 05/04/2017   HGB 13.7 05/04/2017   HCT 40.7 05/04/2017   MCV 114.0 (H) 05/04/2017   PLT 215 05/04/2017   Lab Results  Component Value Date   NA 139 03/06/2017   K 4.5 03/06/2017   CL 102 03/06/2017   CO2 32 03/06/2017   Lab Results  Component Value Date   ALT 17 03/06/2017   AST 20 03/06/2017   ALKPHOS 79 03/06/2017   BILITOT 0.8 03/06/2017      RADIOGRAPHY: US Breast Ltd Uni Left Inc Axilla  Result Date: 05/22/2017 CLINICAL DATA:  Screening recall for possible left breast mass. EXAM: 2D DIGITAL DIAGNOSTIC UNILATERAL LEFT MAMMOGRAM WITH CAD AND ADJUNCT TOMO LEFT BREAST ULTRASOUND COMPARISON:  Previous exam(s). ACR Breast Density Category b: There are scattered areas of fibroglandular density. FINDINGS: Spot compression CC and MLO tomograms were performed over the upper-outer left breast demonstrating a persistent area of distortion with an ill-defined mass and coarse calcification present centrally. Mammographic images were processed with CAD. Physical examination of the outer left breast does not reveal any palpable masses. Targeted ultrasound of the left breast was performed demonstrating an irregular shadowing mass at the 2:30 position 5 cm from nipple measuring 0.8 x 0.8 x 1.5  cm. This corresponds well with the area of distortion seen at mammography. A few complicated cysts are seen in this region. No lymphadenopathy seen in the left axilla. IMPRESSION: Suspicious left breast mass/ distortion. RECOMMENDATION: Ultrasound-guided biopsy of the mass/ distortion in the left breast at the 2:30 position is  recommended. If the biopsy marking clip is not located at the site of distortion seen mammographically, then stereotactic guided biopsy of that area of distortion is recommended. Biopsy is scheduled for Tuesday 05/29/2017 at 8 a.m. I have discussed the findings and recommendations with the patient. Results were also provided in writing at the conclusion of the visit. If applicable, a reminder letter will be sent to the patient regarding the next appointment. BI-RADS CATEGORY  4: Suspicious. Electronically Signed   By: Everlean Alstrom M.D.   On: 05/22/2017 13:32   Mm Diag Breast Tomo Uni Left  Result Date: 05/22/2017 CLINICAL DATA:  Screening recall for possible left breast mass. EXAM: 2D DIGITAL DIAGNOSTIC UNILATERAL LEFT MAMMOGRAM WITH CAD AND ADJUNCT TOMO LEFT BREAST ULTRASOUND COMPARISON:  Previous exam(s). ACR Breast Density Category b: There are scattered areas of fibroglandular density. FINDINGS: Spot compression CC and MLO tomograms were performed over the upper-outer left breast demonstrating a persistent area of distortion with an ill-defined mass and coarse calcification present centrally. Mammographic images were processed with CAD. Physical examination of the outer left breast does not reveal any palpable masses. Targeted ultrasound of the left breast was performed demonstrating an irregular shadowing mass at the 2:30 position 5 cm from nipple measuring 0.8 x 0.8 x 1.5 cm. This corresponds well with the area of distortion seen at mammography. A few complicated cysts are seen in this region. No lymphadenopathy seen in the left axilla. IMPRESSION: Suspicious left breast mass/  distortion. RECOMMENDATION: Ultrasound-guided biopsy of the mass/ distortion in the left breast at the 2:30 position is recommended. If the biopsy marking clip is not located at the site of distortion seen mammographically, then stereotactic guided biopsy of that area of distortion is recommended. Biopsy is scheduled for Tuesday 05/29/2017 at 8 a.m. I have discussed the findings and recommendations with the patient. Results were also provided in writing at the conclusion of the visit. If applicable, a reminder letter will be sent to the patient regarding the next appointment. BI-RADS CATEGORY  4: Suspicious. Electronically Signed   By: Everlean Alstrom M.D.   On: 05/22/2017 13:32   Mm Clip Placement Left  Result Date: 05/29/2017 CLINICAL DATA:  Ultrasound-guided biopsy was performed of a suspicious mass in the 2:30 position of the left breast. EXAM: DIAGNOSTIC LEFT MAMMOGRAM POST ULTRASOUND BIOPSY COMPARISON:  Previous exam(s). FINDINGS: Mammographic images were obtained following ultrasound guided biopsy of a left breast mass at 2:30 position approximately 5 cm from the nipple. A ribbon shaped biopsy clip is satisfactorily positioned in the region of concern. There are some post biopsy changes in this region as well. IMPRESSION: Satisfactory position of ribbon shaped biopsy clip in the upper-outer quadrant of the left breast. Final Assessment: Post Procedure Mammograms for Marker Placement Electronically Signed   By: Curlene Dolphin M.D.   On: 05/29/2017 09:09   [place] Lt Breast Bx W Loc Dev 1st Lesion Img Bx Spec US Guide  Addendum Date: 06/01/2017   ADDENDUM REPORT: 06/01/2017 09:56 ADDENDUM: Pathology of the left breast biopsy revealed INVASIVE DUCTAL CARCINOMA, GRADE 1/2. This was found to be concordant by Dr. Radford Pax. Recommendation:  Surgical and oncology referrals. At the patient's request, results and recommendations were relayed to the patient by phone by Dr. Radford Pax on 05/31/17. The patient stated she had  done well following the biopsy. All of her questions were answered. The surgical referral will be made and the patient will be contacted with appointment information. On 06/01/17, the patient's son, Gene, spoke  with Dr. Radford Pax by phone. He stated his mother has spoken to her primary care physician, Dr. [name]. At Dr. Ria Comment recommendation, his office will arrange the surgical referral to [facility] in [place]. Addendum by Jetta Lout, RRA on 06/01/17. Electronically Signed   By: Curlene Dolphin M.D.   On: 06/01/2017 09:56   Result Date: 06/01/2017 CLINICAL DATA:  Ultrasound-guided biopsy was recommended for a left breast mass at 2:30 position. EXAM: ULTRASOUND GUIDED LEFT BREAST CORE NEEDLE BIOPSY COMPARISON:  Previous exam(s). FINDINGS: I met with the patient and we discussed the procedure of ultrasound-guided biopsy, including benefits and alternatives. We discussed the high likelihood of a successful procedure. We discussed the risks of the procedure, including infection, bleeding, tissue injury, clip migration, and inadequate sampling. Informed written consent was given. The usual time-out protocol was performed immediately prior to the procedure. Lesion quadrant: Upper outer quadrant Using sterile technique and 1% Lidocaine as local anesthetic, under direct ultrasound visualization, a 14 gauge spring-loaded device was used to perform biopsy of a hypoechoic mass with shadowing in the 2:30 position 5 cm from the nipple using a lateral to medial approach. At the conclusion of the procedure a ribbon shaped tissue marker clip was deployed into the biopsy cavity. Follow up 2 view mammogram was performed and dictated separately. IMPRESSION: Ultrasound guided biopsy of left breast mass. No apparent complications. Electronically Signed: By: Curlene Dolphin M.D. On: 05/29/2017 09:13       IMPRESSION/PLAN: 1. Stage IA, cT1cN0Mx ER weakly positive, PR/HER2 negative, grade 1-2 invasive ductal  carcinoma of the left breast. Dr. Lisbeth Renshaw discusses the pathology findings and reviews the nature of left breast cancer disease. Surgical recommendations are to proceed with breast conservation with lumpectomy with sentinel lymph node evaluation. Provided that chemotherapy is not indicated, the patient's course would then be followed by external radiotherapy to the breast followed by antiestrogen therapy. We discussed the risks, benefits, short, and long term effects of radiotherapy, and the patient is interested in proceeding. Dr. Lisbeth Renshaw discusses the delivery and logistics of radiotherapy including deep inspiration breath hold technique. A course of treatment could be between 4- 6 1/2 weeks following final pathologic findings. We will see her back about 2 weeks after surgery to move forward with the simulation and planning process and anticipate starting radiotherapy about 4 weeks after surgery.   The above documentation reflects my direct findings during this shared patient visit. Please see the separate note by Dr. Lisbeth Renshaw on this date for the remainder of the patient's plan of care.    Carola Rhine, PAC  ------------------------------------------------  Jodelle Gross, MD, PhD This document serves as a record of services personally performed by Shona Simpson, PA-C and Kyung Rudd, MD. It was created on their behalf by Valeta Harms, a trained medical scribe. The creation of this record is based on the scribe's personal observations and the providers' statements to them. This document has been checked and approved by the attending provider.

## 2017-06-19 NOTE — Progress Notes (Signed)
Please see the Nurse Progress Note in the MD Initial Consult Encounter for this patient. 

## 2017-06-21 ENCOUNTER — Ambulatory Visit (HOSPITAL_COMMUNITY): Payer: Self-pay

## 2017-06-21 ENCOUNTER — Other Ambulatory Visit: Payer: Self-pay | Admitting: General Surgery

## 2017-06-21 DIAGNOSIS — Z170 Estrogen receptor positive status [ER+]: Principal | ICD-10-CM

## 2017-06-21 DIAGNOSIS — C50412 Malignant neoplasm of upper-outer quadrant of left female breast: Secondary | ICD-10-CM

## 2017-06-22 ENCOUNTER — Encounter (HOSPITAL_COMMUNITY): Payer: Medicare Other | Attending: Oncology | Admitting: Adult Health

## 2017-06-22 ENCOUNTER — Other Ambulatory Visit (HOSPITAL_COMMUNITY): Payer: Self-pay | Admitting: *Deleted

## 2017-06-22 ENCOUNTER — Encounter (HOSPITAL_COMMUNITY): Payer: Self-pay | Admitting: Adult Health

## 2017-06-22 VITALS — BP 126/52 | HR 64 | Temp 97.9°F | Resp 18 | Ht 64.0 in | Wt 287.4 lb

## 2017-06-22 DIAGNOSIS — Z8544 Personal history of malignant neoplasm of other female genital organs: Secondary | ICD-10-CM | POA: Diagnosis not present

## 2017-06-22 DIAGNOSIS — R600 Localized edema: Secondary | ICD-10-CM | POA: Diagnosis not present

## 2017-06-22 DIAGNOSIS — Z170 Estrogen receptor positive status [ER+]: Secondary | ICD-10-CM | POA: Diagnosis not present

## 2017-06-22 DIAGNOSIS — R530 Neoplastic (malignant) related fatigue: Secondary | ICD-10-CM | POA: Diagnosis not present

## 2017-06-22 DIAGNOSIS — D471 Chronic myeloproliferative disease: Secondary | ICD-10-CM

## 2017-06-22 DIAGNOSIS — C50412 Malignant neoplasm of upper-outer quadrant of left female breast: Secondary | ICD-10-CM | POA: Insufficient documentation

## 2017-06-22 DIAGNOSIS — C50912 Malignant neoplasm of unspecified site of left female breast: Secondary | ICD-10-CM | POA: Diagnosis not present

## 2017-06-22 DIAGNOSIS — D473 Essential (hemorrhagic) thrombocythemia: Secondary | ICD-10-CM

## 2017-06-22 NOTE — Progress Notes (Signed)
[place], [place] [address]   CLINIC:  Medical Oncology/Hematology  PCP:  [name], MD [address] [place] [address] [phone]   REASON FOR VISIT:  Follow-up for Essential thrombocytosis, JAK2+ myeloproliferative disorder AND Newly diagnosed clinical Stage IA invasive ductal carcinoma of (L) breast; ER+ (weak staining), PR-/HER2-  CURRENT THERAPY: Hydrea 1000 mg daily AND pending breast surgery    INTERVAL HISTORY:  Ms. Khurana 76 y.o. female returns for follow-up for essential thrombocytosis in the setting of JAK2+ myeloproliferative disorder and newly diagnosed left breast cancer.   Underwent screening mammogram on 05/04/17 and was recalled for (L) breast abnormality. Diagnosed (L) breast mammogram and ultrasound were performed on 05/22/17 revealing a 0.8 x 0.8 x 1.5 cm mass in the left breast at the 2:30 position; no lymphadenopathy seen in the left axilla on ultrasound.  She underwent left breast biopsy on 05/29/17 which showed invasive ductal carcinoma, grade 1/2; ER positive at 5% with weak staining, progesterone negative, HER-2 negative with a ratio 1.73, Ki-67 15%.  She has already with Dr. Donne Hazel at [facility] and her left breast lumpectomy is scheduled for 07/10/17. She has also already met with Dr. Lisbeth Renshaw with radiation oncology in [place].  She presents today to discuss further treatment planning from a medical oncology standpoint.  Overall she tells me she feels quite well. Appetite 100%, energy levels 50%. She denies any pain. She remains on Hydrea 1000 mg daily for her myeloproliferative disorder. Continues to tolerate Hydrea without complaints. Denies any new chest pain or bleeding episodes. Fatigue is her biggest complaint as it relates to Hydrea.  She has chronic dyspnea on exertion, which is not new and largely stable. She has bilateral lower extremity edema which is also chronic and  unchanged.  Otherwise, she is largely without complaints today. She is here today with her husband and daughter.     REVIEW OF SYSTEMS:  Review of Systems  Constitutional: Positive for fatigue. Negative for chills and fever.  HENT:  Negative.  Negative for lump/mass and nosebleeds.   Eyes: Negative.   Respiratory: Positive for shortness of breath. Negative for cough.   Cardiovascular: Positive for leg swelling. Negative for chest pain.  Gastrointestinal: Negative.  Negative for abdominal pain, blood in stool, constipation, diarrhea, nausea and vomiting.  Endocrine: Negative.   Genitourinary: Negative.  Negative for dysuria and hematuria.   Musculoskeletal: Negative.   Skin: Negative.  Negative for rash.  Neurological: Negative.  Negative for dizziness and headaches.  Hematological: Negative.  Negative for adenopathy. Does not bruise/bleed easily.  Psychiatric/Behavioral: Negative.  Negative for depression and sleep disturbance. The patient is not nervous/anxious.      PAST MEDICAL/SURGICAL HISTORY:  Past Medical History:  Diagnosis Date  . Anemia 04/02/2015  . Arthritis   . Atypical glandular cells on vaginal Papanicolaou smear 04/12/2016   Well get colpo and biopsy with JVF  . Bronchitis, chronic obstructive ([place]) since 2000's  . Cancer [facility])    dx with endometrial cancer   . Dysrhythmia   . GERD (gastroesophageal reflux disease)   . Gout   . Heart murmur   . Hiatal hernia   . Hiatal hernia 11/19/2012  . History of endometrial cancer 04/06/2016   Sp hysterectomy 2012  . Hypertension   . Myeloproliferative disorder, JAK-2 positive 01/05/2014  . Obesity   . Obesity, morbid (more than 100 lbs over ideal weight or BMI > 40) ([place]) 08/2011   Ht  5'7", wt 280 lb  . Pneumonia 09/2014  . Polycythemia vera(238.4) 01/18/2014  . PONV (postoperative nausea and vomiting)    post-op hypoxic resp failure 09/2011 and require Bi-Pap (possible r/t - pressure pulm edema vs asp PNA, COPD  exacerbation, obesity hypoventilation  . Shortness of breath    unable to lay flat due to dyspnea  . Vaginal Pap smear, abnormal    Past Surgical History:  Procedure Laterality Date  . ABDOMINAL HYSTERECTOMY  09/12/2011   Procedure: HYSTERECTOMY ABDOMINAL;  Surgeon: Janie Morning, MD;  Location: [facility];  Service: Gynecology;  Laterality: N/A;  Total Abdominal Hysterectomy, Bilateral Salpingo Oophorectomy  . COLONOSCOPY  07/05/2012   Procedure: COLONOSCOPY;  Surgeon: Rogene Houston, MD;  Location: [facility];  Service: Endoscopy;  Laterality: N/A;  730  . FLEXIBLE SIGMOIDOSCOPY N/A 04/03/2015   Procedure: FLEXIBLE SIGMOIDOSCOPY;  Surgeon: Rogene Houston, MD;  Location: [facility];  Service: Endoscopy;  Laterality: N/A;  . HYSTEROSCOPY W/D&C  08/15/2011   Procedure: DILATATION AND CURETTAGE (D&C) /HYSTEROSCOPY;  Surgeon: Jonnie Kind, MD;  Location: [facility];  Service: Gynecology;  Laterality: N/A;  With Suction Curette  . KNEE ARTHROPLASTY Left 03/15/2015   Procedure: COMPUTER ASSISTED TOTAL KNEE ARTHROPLASTY;  Surgeon: Marybelle Killings, MD;  Location: [facility];  Service: Orthopedics;  Laterality: Left;  . SALPINGOOPHORECTOMY  09/12/2011   Procedure: SALPINGO OOPHERECTOMY;  Surgeon: Janie Morning, MD;  Location: [facility];  Service: Gynecology;  Laterality: Bilateral;     SOCIAL HISTORY:  Social History   Social History  . Marital status: Married    Spouse name: N/A  . Number of children: N/A  . Years of education: N/A   Occupational History  . Not on file.   Social History Main Topics  . Smoking status: Never Smoker  . Smokeless tobacco: Never Used  . Alcohol use No  . Drug use: No  . Sexual activity: No     Comment: hysterectomy   Other Topics Concern  . Not on file   Social History Narrative  . No narrative on file    FAMILY HISTORY:  Family History  Problem Relation Age of Onset  . Other Mother   . Lung cancer Father   . Hypertension Son   . Anesthesia problems Neg  Hx   . Hypotension Neg Hx   . Malignant hyperthermia Neg Hx   . Pseudochol deficiency Neg Hx     CURRENT MEDICATIONS:  Outpatient Encounter Prescriptions as of 06/22/2017  Medication Sig  . albuterol (PROVENTIL HFA;VENTOLIN HFA) 108 (90 Base) MCG/ACT inhaler Inhale 2 puffs into the lungs every 6 (six) hours as needed for wheezing or shortness of breath. Reported on 02/07/2016  . aspirin EC 81 MG tablet Take 81 mg by mouth daily.  Marland Kitchen atenolol (TENORMIN) 50 MG tablet Take 25 mg by mouth 2 (two) times daily.   . cloNIDine (CATAPRES) 0.3 MG tablet Take 0.15 mg by mouth 2 (two) times daily.   . diclofenac (VOLTAREN) 75 MG EC tablet Take 1 tablet (75 mg total) by mouth 2 (two) times daily.  Marland Kitchen diltiazem (CARDIZEM CD) 180 MG 24 hr capsule Take 180 mg by mouth at bedtime.   . docusate sodium (COLACE) 100 MG capsule Take 100 mg by mouth as needed. Reported on 02/07/2016  . esomeprazole (NEXIUM) 40 MG capsule Take 1 capsule (40 mg total) by mouth every morning. (Patient taking differently: Take 40 mg by mouth at bedtime. )  . hydroxyurea (HYDREA) 500  MG capsule TAKE TWO CAPSULES BY MOUTH ONCE DAILY  . losartan (COZAAR) 100 MG tablet Take 100 mg by mouth daily.   . Simethicone (GAS RELIEF PO) Take 1 tablet by mouth as needed (gas).   . simvastatin (ZOCOR) 10 MG tablet Take 10 mg by mouth daily after supper.  . traZODone (DESYREL) 50 MG tablet Take 1 tablet by mouth at bedtime.  . [DISCONTINUED] aspirin EC 325 MG EC tablet Take 1 tablet (325 mg total) by mouth daily with breakfast. (Patient not taking: Reported on 06/19/2017)   No facility-administered encounter medications on file as of 06/22/2017.     ALLERGIES:  Allergies  Allergen Reactions  . Carbamazepine Hives and Other (See Comments)    headache  . Sulfa Antibiotics     Rash     PHYSICAL EXAM:  ECOG Performance status: 2 - Symptomatic; requires assistance. Requires frequent rest breaks when performing ADLs.   Vitals:   06/22/17 0835  BP:  (!) 126/52  Pulse: 64  Resp: 18  Temp: 97.9 F (36.6 C)  SpO2: 91%   Filed Weights   06/22/17 0835  Weight: 287 lb 6.4 oz (130.4 kg)    Physical Exam  Constitutional: She is oriented to person, place, and time and well-developed, well-nourished, and in no distress.  Patient unable to get onto exam table -Physical exam done with patient seated in chair.   HENT:  Head: Normocephalic.  Mouth/Throat: Oropharynx is clear and moist.  Eyes: Conjunctivae are normal. No scleral icterus.  Neck: Normal range of motion. Neck supple.  Cardiovascular: Normal rate and regular rhythm.   Murmur (chronic ) heard. Pulmonary/Chest: Effort normal. No respiratory distress. She has no wheezes.  Diminished breath sounds to bilat bases.   Abdominal: Soft. Bowel sounds are normal. There is no tenderness. There is no rebound.  Musculoskeletal: Normal range of motion. She exhibits edema (1+ BLE/ankle/pedal edema ).  Lymphadenopathy:    She has no cervical adenopathy.  Neurological: She is alert and oriented to person, place, and time. No cranial nerve deficit. Gait normal.  Skin: Skin is warm and dry. No rash noted.  Psychiatric: Mood, memory, affect and judgment normal.  Nursing note and vitals reviewed.    LABORATORY DATA:  I have reviewed the labs as listed.  CBC    Component Value Date/Time   WBC 6.8 05/04/2017 1301   RBC 3.57 (L) 05/04/2017 1301   HGB 13.7 05/04/2017 1301   HCT 40.7 05/04/2017 1301   PLT 215 05/04/2017 1301   MCV 114.0 (H) 05/04/2017 1301   MCH 38.4 (H) 05/04/2017 1301   MCHC 33.7 05/04/2017 1301   RDW 13.0 05/04/2017 1301   LYMPHSABS 1.8 05/04/2017 1301   MONOABS 0.5 05/04/2017 1301   EOSABS 0.1 05/04/2017 1301   BASOSABS 0.0 05/04/2017 1301   CMP Latest Ref Rng & Units 03/06/2017 08/31/2016 02/07/2016  Glucose 65 - 99 mg/dL 120(H) 102(H) 90  BUN 6 - 20 mg/dL 22(H) 16 22(H)  Creatinine 0.44 - 1.00 mg/dL 0.79 0.68 0.60  Sodium 135 - 145 mmol/L 139 136 140  Potassium  3.5 - 5.1 mmol/L 4.5 4.3 4.2  Chloride 101 - 111 mmol/L 102 99(L) 103  CO2 22 - 32 mmol/L 32 32 30  Calcium 8.9 - 10.3 mg/dL 9.3 9.3 9.0  Total Protein 6.5 - 8.1 g/dL 7.4 7.6 7.5  Total Bilirubin 0.3 - 1.2 mg/dL 0.8 0.6 0.6  Alkaline Phos 38 - 126 U/L 79 76 77  AST 15 - 41 U/L  20 24 20   ALT 14 - 54 U/L 17 21 16     PENDING LABS:    DIAGNOSTIC IMAGING:  (L) diagnostic mammogram & breast US: 05/22/17 CLINICAL DATA:  Screening recall for possible left breast mass.  EXAM: 2D DIGITAL DIAGNOSTIC UNILATERAL LEFT MAMMOGRAM WITH CAD AND ADJUNCT TOMO  LEFT BREAST ULTRASOUND  COMPARISON:  Previous exam(s).  ACR Breast Density Category b: There are scattered areas of fibroglandular density.  FINDINGS: Spot compression CC and MLO tomograms were performed over the upper-outer left breast demonstrating a persistent area of distortion with an ill-defined mass and coarse calcification present centrally.  Mammographic images were processed with CAD.  Physical examination of the outer left breast does not reveal any palpable masses.  Targeted ultrasound of the left breast was performed demonstrating an irregular shadowing mass at the 2:30 position 5 cm from nipple measuring 0.8 x 0.8 x 1.5 cm. This corresponds well with the area of distortion seen at mammography. A few complicated cysts are seen in this region. No lymphadenopathy seen in the left axilla.  IMPRESSION: Suspicious left breast mass/ distortion.  RECOMMENDATION: Ultrasound-guided biopsy of the mass/ distortion in the left breast at the 2:30 position is recommended. If the biopsy marking clip is not located at the site of distortion seen mammographically, then stereotactic guided biopsy of that area of distortion is recommended. Biopsy is scheduled for Tuesday 05/29/2017 at 8 a.m.  I have discussed the findings and recommendations with the patient. Results were also provided in writing at the conclusion of  the visit. If applicable, a reminder letter will be sent to the patient regarding the next appointment.  BI-RADS CATEGORY  4: Suspicious.   Electronically Signed   By: Everlean Alstrom M.D.   On: 05/22/2017 13:32     PATHOLOGY:  (L) breast biopsy: 05/29/17          ASSESSMENT & PLAN:   Essential thrombocytosis, JAK2+ myeloproliferative disorder:  -Remains on Hydrea 1000 mg daily. Last platelet count 215,000 on 05/04/17.  -Recommended continue current dose of Hydrea. Given JAK2 positivity, lifelong Hydrea therapy is anticipated. -Will continue to monitor CBC with diff going forward. Her platelets have been stable in the 200,000 range for >1 year on current dose of Hydrea.   Newly diagnosed clinical Stage IA invasive ductal carcinoma of (L) breast; ER+ (weak staining), PR-/HER2-:  -Majority of today's visit was spent discussing patient's newly diagnosed left breast cancer and treatment recommendations. We reviewed pathology and staging; she understands that this is an early stage breast cancer, which is favorable.  She was given 2 different patient education books about breast cancer today as well.  -Given weak positivity of ER staining on prognostic panel, we discussed ordering Oncotype DX testing to determine if she would benefit from chemotherapy. It is possible her cancer is behaving more like a triple negative breast cancer (ie more aggressive) and Oncotype DX would help [place] determine if she would benefit from chemotheapy. Her performance status is borderline given comorbid conditions.  She is undecided about the "idea" of chemotherapy, but she would like to proceed with Oncotype testing to gain further information, which is reasonable; orders sent today for Oncotype DX.   -Lumpectomy scheduled with Dr. Donne Hazel for 07/10/17. Based on final pathologic staging, she should be considered for adjuvant radiation therapy. She actually has already met Dr, Lisbeth Renshaw in [place] and wishes  to have her radiation therapy in [place], which is great. They are planning hypo-fractionated radiation for her.  -Although ER  staining is weakly positive, general consensus guidelines recommend attempting anti-estrogen therapy for patients like Ms. Sano in the adjuvant setting. Would consider aromatase inhibitor therapy for her post-radiation. We discussed the role that anti-estrogen therapy plays in the prevention of breast cancer recurrence after definitive treatment is complete.  -Return to cancer center in about 5-6 weeks to review Oncotype DX results, surgical path, and finalize treatment plan. Oncotype DX results should be available then as well, if she requires adjuvant chemotherapy. If no chemo, then she will proceed with radiation.   Endometrial cancer:  -Treated definitively with hysterectomy; no vaginal bleeding or abdominal pain. No clinical symptoms of recurrence.   Genetics:  -Given new breast cancer and h/o endometrial cancer, we discussed the option of referral to our genetics counselor for consultation. We discussed how genetic testing can help provide helpful information for not only her, but her children as well. She is not interested in pursuing genetic testing at this time.       Dispo:  -Oncotype DX ordered today.  -Return to cancer center in 5-6 weeks to review Oncotype DX and surgical pathology results to finalize treatment planning for breast cancer.    All questions were answered to patient's stated satisfaction. Encouraged patient to call with any new concerns or questions before her next visit to the cancer center and we can certain see her sooner, if needed.    Plan of care discussed with Dr. Talbert Cage, who agrees with the above aforementioned.    A total of 50 minutes was spent in face-to-face care of this patient, with greater than 50% of that time spent in counseling and care-coordination.    Orders placed this encounter:  No orders of the defined types  were placed in this encounter.     Mike Craze, NP [place] [phone]

## 2017-06-22 NOTE — Patient Instructions (Addendum)
[place] at [facility] Discharge Instructions  RECOMMENDATIONS MADE BY THE CONSULTANT AND ANY TEST RESULTS WILL BE SENT TO YOUR REFERRING PHYSICIAN.  You were seen today by Mike Craze NP. Return in 5-6 weeks for follow up.    Thank you for choosing [place] at [facility] to provide your oncology and hematology care.  To afford each patient quality time with our provider, please arrive at least 15 minutes before your scheduled appointment time.    If you have a lab appointment with the [facility] please come in thru the  Main Entrance and check in at the main information desk  You need to re-schedule your appointment should you arrive 10 or more minutes late.  We strive to give you quality time with our providers, and arriving late affects you and other patients whose appointments are after yours.  Also, if you no show three or more times for appointments you may be dismissed from the clinic at the providers discretion.     Again, thank you for choosing [facility].  Our hope is that these requests will decrease the amount of time that you wait before being seen by our physicians.       _____________________________________________________________  Should you have questions after your visit to [facility], please contact our office at [phone] between the hours of 8:30 a.m. and 4:30 p.m.  Voicemails left after 4:30 p.m. will not be returned until the following business day.  For prescription refill requests, have your pharmacy contact our office.       Resources For Cancer Patients and their Caregivers ? American Cancer Society: Can assist with transportation, wigs, general needs, runs Look Good Feel Better.        641-454-9343 ? Cancer Care: Provides financial assistance, online support groups, medication/co-pay assistance.  1-800-813-HOPE (678)155-9675) ? [place] Assists  [place] cancer patients and their families through emotional , educational and financial support.  952-716-2309 ? [place] DSS Where to apply for food stamps, Medicaid and utility assistance. 585-531-4283 ? [facility]: Transportation to medical appointments. 903-816-8502 ? Social Security Administration: May apply for disability if have a Stage IV cancer. 6200994937 430 388 0741 ? [employer], Disability and Transit Services: Assists with nutrition, care and transit needs. [place] Support Programs: @10RELATIVEDAYS @ > Cancer Support Group  2nd Tuesday of the month 1pm-2pm, Journey Room  > Creative Journey  3rd Tuesday of the month 1130am-1pm, Journey Room  > Look Good Feel Better  1st Wednesday of the month 10am-12 noon, Journey Room (Call [place] to register [phone])

## 2017-06-28 ENCOUNTER — Encounter: Payer: Self-pay | Admitting: Obstetrics and Gynecology

## 2017-06-28 ENCOUNTER — Ambulatory Visit (INDEPENDENT_AMBULATORY_CARE_PROVIDER_SITE_OTHER): Payer: Medicare Other | Admitting: Obstetrics and Gynecology

## 2017-06-28 VITALS — BP 130/60 | HR 80 | Wt 286.8 lb

## 2017-06-28 DIAGNOSIS — Z170 Estrogen receptor positive status [ER+]: Secondary | ICD-10-CM

## 2017-06-28 DIAGNOSIS — Z8542 Personal history of malignant neoplasm of other parts of uterus: Secondary | ICD-10-CM | POA: Diagnosis not present

## 2017-06-28 DIAGNOSIS — C50912 Malignant neoplasm of unspecified site of left female breast: Secondary | ICD-10-CM | POA: Diagnosis not present

## 2017-06-28 NOTE — Progress Notes (Addendum)
Duplicated note  [facility] Visit  06/28/2017            Patient name: [name] MRN [id number]  Date of birth: 07/03/1941  CC & HPI:  [name] is a 76 y.o. female presenting today for discussion of recent dx of left breast invasive ductal carcinoma, grade 1-2. Dr. Donne Hazel will be performing a partial mastectomy. Pt wants to discuss radiation possibilities. She has been going to the [facility] for appointments. Pt has hx of uterine cancer 5.5 years ago.   ROS:  ROS  -fever -pain All systems are negative except as noted in the HPI and PMH.   Pertinent History Reviewed:   Reviewed: Significant for endometrial cancer, breast cancer Medical         Past Medical History:  Diagnosis Date   Anemia 04/02/2015   Arthritis    Atypical glandular cells on vaginal Papanicolaou smear 04/12/2016   Well get colpo and biopsy with JVF   Bronchitis, chronic obstructive ([place]) since 2000's   Cancer ([place])    dx with endometrial cancer    Dysrhythmia    GERD (gastroesophageal reflux disease)    Gout    Heart murmur    Hiatal hernia    Hiatal hernia 11/19/2012   History of endometrial cancer 04/06/2016   Sp hysterectomy 2012   Hypertension    Myeloproliferative disorder, JAK-2 positive 01/05/2014   Obesity    Obesity, morbid (more than 100 lbs over ideal weight or BMI > 40) ([place]) 08/2011   Ht 5'7", wt 280 lb   Pneumonia 09/2014   Polycythemia vera(238.4) 01/18/2014   PONV (postoperative nausea and vomiting)    post-op hypoxic resp failure 09/2011 and require Bi-Pap (possible r/t - pressure pulm edema vs asp PNA, COPD exacerbation, obesity hypoventilation   Shortness of breath    unable to lay flat due to dyspnea   Vaginal Pap smear, abnormal                               Surgical Hx:    Past Surgical History:  Procedure Laterality Date   ABDOMINAL HYSTERECTOMY  09/12/2011   Procedure: HYSTERECTOMY ABDOMINAL;  Surgeon: Janie Morning, MD;  [facility]:  [facility];  Service: Gynecology;  Laterality: N/A;  Total Abdominal Hysterectomy, Bilateral Salpingo Oophorectomy   COLONOSCOPY  07/05/2012   Procedure: COLONOSCOPY;  Surgeon: Rogene Houston, MD;  [facility];  Service: Endoscopy;  Laterality: N/A;  [place] N/A 04/03/2015   Procedure: FLEXIBLE SIGMOIDOSCOPY;  Surgeon: Rogene Houston, MD;  [facility];  Service: Endoscopy;  Laterality: N/A;   HYSTEROSCOPY W/D&C  08/15/2011   Procedure: DILATATION AND CURETTAGE (D&C) /HYSTEROSCOPY;  Surgeon: Jonnie Kind, MD;  Location: [facility];  Service: Gynecology;  Laterality: N/A;  With Suction Curette   KNEE ARTHROPLASTY Left 03/15/2015   Procedure: COMPUTER ASSISTED TOTAL KNEE ARTHROPLASTY;  Surgeon: Marybelle Killings, MD;  Location: [place];  Service: Orthopedics;  Laterality: Left;   SALPINGOOPHORECTOMY  09/12/2011   Procedure: SALPINGO OOPHERECTOMY;  Surgeon: Janie Morning, MD;  [facility];  Service: Gynecology;  Laterality: Bilateral;   Medications: Reviewed & Updated - see associated section                       Current Outpatient Prescriptions:    aspirin EC 81 MG tablet, Take 81 mg by  mouth daily., Disp: , Rfl:    atenolol (TENORMIN) 50 MG tablet, Take 25 mg by mouth 2 (two) times daily. , Disp: , Rfl:    cloNIDine (CATAPRES) 0.3 MG tablet, Take 0.15 mg by mouth 2 (two) times daily. , Disp: , Rfl:    diclofenac (VOLTAREN) 75 MG EC tablet, Take 1 tablet (75 mg total) by mouth 2 (two) times daily., Disp: 60 tablet, Rfl: 3   diltiazem (CARDIZEM CD) 180 MG 24 hr capsule, Take 180 mg by mouth at bedtime. , Disp: , Rfl:    esomeprazole (NEXIUM) 40 MG capsule, Take 1 capsule (40 mg total) by mouth every morning. (Patient taking differently: Take 40 mg by mouth at bedtime. ), Disp: 30 capsule, Rfl: 11   hydroxyurea (HYDREA) 500 MG capsule, TAKE TWO CAPSULES BY MOUTH ONCE DAILY, Disp: 180 capsule, Rfl: 1   losartan (COZAAR) 100 MG tablet, Take 100 mg by  mouth daily. , Disp: , Rfl:    Simethicone (GAS RELIEF PO), Take 1 tablet by mouth as needed (gas). , Disp: , Rfl:    simvastatin (ZOCOR) 10 MG tablet, Take 10 mg by mouth daily after supper., Disp: , Rfl:    traZODone (DESYREL) 50 MG tablet, Take 1 tablet by mouth at bedtime., Disp: , Rfl:    albuterol (PROVENTIL HFA;VENTOLIN HFA) 108 (90 Base) MCG/ACT inhaler, Inhale 2 puffs into the lungs every 6 (six) hours as needed for wheezing or shortness of breath. Reported on 02/07/2016 (Patient not taking: Reported on 06/28/2017), Disp: 1 Inhaler, Rfl: 0   docusate sodium (COLACE) 100 MG capsule, Take 100 mg by mouth as needed. Reported on 02/07/2016, Disp: , Rfl:    Social History: Reviewed -  reports that she has never smoked. She has never used smokeless tobacco.  Objective Findings:  Vitals: Blood pressure 130/60, pulse 80, weight 286 lb 12.8 oz (130.1 kg).  Physical Examination: General appearance - alert, well appearing, and in no distress Mental status - alert, oriented to person, place, and time Pelvic - not indicated  Discussion: Pt pathology reviewed, invasive ductoral carcinoma of left breast, grade 1-2. Test results show estrogen recepter positive, PR negative on initial testing. Pt's upcoming surgery was discussed, as well as the possibility/function of radiation therapy.    Assessment & Plan:   A:  1. New diagnosis of left invasive breast caner, scheduled for partial mastectomy with Dr. Donne Hazel 2. 5.5 years since endometrial cancer with negative f/u  P:  1. Discuss monitoring GYN after 5 years with general oncology 2. f/u in 3 months     By signing my name below, I, [name], attest that this documentation has been prepared under the direction and in the presence of Jonnie Kind, MD. Electronically Signed: Jabier Stafford, Medical Scribe. 06/28/17. 9:24 AM.  I personally performed the services described in this documentation, which was SCRIBED in my presence. The  recorded information has been reviewed and considered accurate. It has been edited as necessary during review. Jonnie Kind, MD

## 2017-07-02 ENCOUNTER — Encounter (HOSPITAL_COMMUNITY): Payer: Self-pay

## 2017-07-02 ENCOUNTER — Encounter (HOSPITAL_COMMUNITY)
Admission: RE | Admit: 2017-07-02 | Discharge: 2017-07-02 | Disposition: A | Discharge status: Home / Self Care | Payer: Medicare Other | Source: Ambulatory Visit | Attending: General Surgery | Admitting: General Surgery

## 2017-07-02 DIAGNOSIS — I4510 Unspecified right bundle-branch block: Secondary | ICD-10-CM | POA: Insufficient documentation

## 2017-07-02 DIAGNOSIS — Z01810 Encounter for preprocedural cardiovascular examination: Secondary | ICD-10-CM | POA: Diagnosis not present

## 2017-07-02 DIAGNOSIS — Z01812 Encounter for preprocedural laboratory examination: Secondary | ICD-10-CM | POA: Diagnosis not present

## 2017-07-02 HISTORY — DX: Vascular disorder of intestine, unspecified: K55.9

## 2017-07-02 LAB — BASIC METABOLIC PANEL
Anion gap: 10 (ref 5–15)
BUN: 18 mg/dL (ref 6–20)
CHLORIDE: 102 mmol/L (ref 101–111)
CO2: 28 mmol/L (ref 22–32)
Calcium: 9.5 mg/dL (ref 8.9–10.3)
Creatinine, Ser: 0.71 mg/dL (ref 0.44–1.00)
GFR calc Af Amer: >60 mL/min (ref >60)
GFR calc non Af Amer: >60 mL/min (ref >60)
GLUCOSE: 118 mg/dL — AB (ref 65–99)
POTASSIUM: 4.4 mmol/L (ref 3.5–5.1)
Sodium: 140 mmol/L (ref 135–145)

## 2017-07-02 LAB — CBC
HCT: 41.1 % (ref 36.0–46.0)
HEMOGLOBIN: 13.7 g/dL (ref 12.0–15.0)
MCH: 38.3 pg — ABNORMAL HIGH (ref 26.0–34.0)
MCHC: 33.3 g/dL (ref 30.0–36.0)
MCV: 114.8 fL — AB (ref 78.0–100.0)
Platelets: 214 10*3/uL (ref 150–400)
RBC: 3.58 MIL/uL — AB (ref 3.87–5.11)
RDW: 13.4 % (ref 11.5–15.5)
WBC: 5.4 10*3/uL (ref 4.0–10.5)

## 2017-07-02 NOTE — Progress Notes (Signed)
Anesthesia PAT Evaluation: Patient is a 76 year old female scheduled for left breast lumpectomy with radioactive seed and sentinel lymph node biopsy on 07/10/17 by Dr. Rolm Bookbinder. Seed implant is scheduled for 07/06/17.  History includes non-smoker, left breast cancer (stage 1A invasive ductal carcinoma, 05/2017), polycythemia vera, JAK2+ myeloproliferative disorder, HTN, GERD, hiatal hernia, murmur (trace MR, mild TR '13 echo), dysrhythmia (no history afib), endometrial cancer s/p hysterectomy '12, dyspnea (unable to lie flat), left TKA 03/15/15 (under GETA), ischemic colitis (admitted 04/02/15-04/04/15). BMI is consistent with morbid obesity.  For anesthesia history she reports post-operative N/V and post-operative hypoxic respiratory failure following hysterectomy done under [facility] 09/2011.  She was seen by PCCM (Dr. Christinia Gully) and Hospitalist. Treated with Bi-PAP, nebulizers, Lasix, Unasyn-->Avelox. Respiratory failure felt multi-factorial with differential including negative pressure pulmonary edema, aspiration PNA (had significant N/V in PACU), COPD exacerbation, CHF (NL LVEF, grade 1 diastolic dysfunction), COPD exacerbation, and obestiy hypoventilation syndrome.  She has never been evaluated for OSA. 03/12/15 CXR shows chronic elevation of right hemi-diaphragm.  She is morbidly obese. She also said that she had a colonoscopy at [facility] in the past under sedation and "the medication didn't work." There were no anesthesia complications following GETA for left TKA in 2016. She thinks she was given a scopolamine patch in the past that helped with post-operative N/V.  - PCP is Dr. [name].   - She has seen by pulmonologist Dr. Sinda Du in 2012 prior to her hysterectomy for what sounds like pulmonology preoperative evaluation; otherwise, she is not followed by pulmonology.  - HEM-ONC was Dr. Ancil Linsey who [facility], who recently left the practice. She was last seen on 06/22/17 with Mike Craze, NP  with plan discussed with Dr. Talbert Cage. RAD-ONC is Dr. Lisbeth Renshaw.  Meds include albuterol, ASA 81 mg (holding for three days pre-operative), atenolol, clonidine, diltiazem, Sominex, Nexium, 65 Fe, hydroxyurea, losartan, simvastatin.  BP 138/61 Comment: rechecked  Pulse 64   Temp 36.8 C   Resp 18   Ht 5\' 4"  (1.626 m)   Wt 286 lb (129.7 kg)   SpO2 96%   BMI 49.09 kg/m  BP 139/101, recheck 138/61. Exam shows a pleasant Caucasian female who is morbidly obese. I evaluated her right after she transferred from the gurney (for EKG) to the hospital wheelchair. She was visibly/audibly SOB with this level of activity. It took her several minutes before the SOB subsided. She was winded, but not in acute distress. Lungs sounds were clear. Heart RRR, but with II/VI SEM. BLE are large, generalized edema, L > R. She says her dyspnea has been stable for at least two years. She is unable to lie flat and sleeps with her HOB ~ 40 degrees elevated. She walks with a walker. She is able to do some light housework as long as she takes frequent breaks. She can take short trips to the grocery store if she uses a buggy for walking support. She denied chest pain, syncope, palpitations. Her LLE edema has been a little worse since TKA, but overall she doesn't feel that her edema has worsened in the past year. She has taken HCTZ in the past. She was treated for bronchitis 05/08/17, but feels completely recovered.  EKG 07/02/17: NSR, right BBB, LVH. Overall, I think EKG is stable when compared to 04/05/15 tracing.  Echo 07/23/12: Study Conclusions - Left ventricle: The cavity size was normal. There was moderate concentric hypertrophy. Systolic function was normal. The estimated ejection fraction was in the range  of 60% to 65%. Doppler parameters are consistent with abnormal left ventricular relaxation (grade 1 diastolic dysfunction). The E/e' ratio is <10, suggesting normal LV filling pressure. - Mitral valve: Calcified  annulus (mostly posterior). Trivial regurgitation. No stenosis. - Left atrium: Moderately dilated (38 ml/m2) - Tricuspid valve: Mild regurgitation. - Pulmonic valve: Trivial regurgitation. - Pulmonary arteries: PA peak pressure: 2mm Hg (S). - Inferior vena cava: The vessel was normal in size; the respirophasic diameter changes were in the normal range (=50%); findings are consistent with normal central venous pressure. - Pericardium, extracardiac: There was no pericardial effusion.  Carotid duplex 06/20/12: IMPRESSION:  Less than 50% stenosis in the right and left internal carotid arteries.  PFTs 03/13/11: FVC 1.84 (69%), FEV1 1.31 (61%), DLCO 19.4 (73%) (DLCO may be underestimated, patient unable to meet desired inspiratory volume). Mild response to bronchodilator.   Preoperative labs noted. Cr 0.71. Non-fasting glucose 118. WBC 5.4. H/H 13.7/41.1, PLT 214K.  Due to patient's SOB with minimal activity, edema, and murmur (reported it was heard ~ 5 years ago; although I did not note it on my 03/2015 exam) I called to speak with Dr. Willey Blade about exam findings with initial plan for her to be seen for preoperative evaluation (although patient did insist that symptoms were chronic). However, after speaking personally with Dr. Willey Blade he discussed that he had last seen her her 05/08/17 and had spoken with her earlier this month prior to composing a letter of medical clearance for this procedure. (Dr. Cristal Generous office had not faxed a copy of the clearance, so Dr. Willey Blade reviewed contents and later faxed PAT a copy as well.) He feels her chronic medical conditions are stable (ie, HTN, essential thrombocytosis). Despite her post-operative respiratory failure in 2012, she did well with GETA in 2016. He felt she had a murmur of aortic sclerosis with prior echo showing normal LVF. Based on his knowledge of patient's known medical history and evaluation of her within the past 2 months, he did not think  patient was exhibiting any acute symptoms and did not feel strongly that he needed to re-evaluate her prior to surgery unless specified by anesthesia. I reviewed this with anesthesiologist Dr. Lissa Hoard. If no acute changes then it is anticipated that she can proceed as planned since I was able to confirm with Dr. Willey Blade that he continues to feel that patient's medical issues are stable and did not recommend preoperative office exam. Her anesthesiologist will re-evaluate on the day of surgery to discuss the definitive anesthesia plan. Of note, she did inquire about the use of a perioperative scopolamine patch. I discussed that she may ask her anesthesiologist, but they are used less often in the elderly due to side effects.  [facility] Short Stay Center/Anesthesiology Phone [phone] 07/02/2017 9:07 PM

## 2017-07-02 NOTE — Progress Notes (Addendum)
PCP is Dr. Salena Saner  LOV 06/2017. He did detect a murmur "not bad enough to worry about"  Echo was done 2013, no cardiac tests recently.  When she does walk and exert, she will wheeze and sob. Initial BP 139/101. Repeated bp at PAT 138/61 HR 61 A. Zelenak, PA to come and interview.

## 2017-07-02 NOTE — Pre-Procedure Instructions (Addendum)
[name]  07/02/2017      [place] [place] [id number] [place]  [id number] Phone: [phone] Fax: [phone]    Your procedure is scheduled on Tuesday, Sept. 4th   Report to [facility] Admitting at 11:15 Am             (posted surgery time 1:15 pm - 2:45 pm)   Call this number if you have problems the MORNING of surgery:  [phone], other questions, call [phone] Mon-Fri from 8a-4p   Remember:  Do not eat food or drink liquids after midnight Monday.              However, at 0900, please drink the [facility]. (exception to the above 'rule')              4-5 days prior to surgery, STOP TAKING any Vitamins, Herbal Supplements, Anti-inflammatories.   Take these medicines the morning of surgery with A SIP OF WATER : Atenolol, Diltiazem, Nexium.   Do not wear jewelry, make-up or nail polish.  Do not wear lotions, powders, perfumes, or deoderant.  Do not shave 48 hours prior to surgery.     Do not bring valuables to the hospital.  [facility] is not responsible for any belongings or valuables.  Contacts, dentures or bridgework may not be worn into surgery.  Leave your suitcase in the car.  After surgery it may be brought to your room. For patients admitted to the hospital, discharge time will be determined by your treatment team.  Patients discharged the day of surgery will not be allowed to drive home, and will need someone to stay with you for the first 24 hrs after.  Please read over the following fact sheets that you were given. Pain Booklet and Surgical Site Infection Prevention      [place]- Preparing For Surgery  Before surgery, you can play an important role. Because skin is not sterile, your skin needs to be as free of germs as possible. You can reduce the number of germs on your skin by washing with CHG (chlorahexidine gluconate) Soap before surgery.  CHG is an antiseptic  cleaner which kills germs and bonds with the skin to continue killing germs even after washing.  Please do not use if you have an allergy to CHG or antibacterial soaps. If your skin becomes reddened/irritated stop using the CHG.  Do not shave (including legs and underarms) for at least 48 hours prior to first CHG shower. It is OK to shave your face.  Please follow these instructions carefully.   1. Shower the NIGHT BEFORE SURGERY and the MORNING OF SURGERY with CHG.   2. If you chose to wash your hair, wash your hair first as usual with your normal shampoo.  3. After you shampoo, rinse your hair and body thoroughly to remove the shampoo.  4. Use CHG as you would any other liquid soap. You can apply CHG directly to the skin and wash gently with a scrungie or a clean washcloth.   5. Apply the CHG Soap to your body ONLY FROM THE NECK DOWN.  Do not use on open wounds or open sores. Avoid contact with your eyes, ears, mouth and genitals (private parts). Wash genitals (private parts) with your normal soap.  6. Wash thoroughly, paying special attention to the area where your surgery will be performed.  7. Thoroughly rinse your body  with warm water from the neck down.  8. DO NOT shower/wash with your normal soap after using and rinsing off the CHG Soap.  9. Pat yourself dry with a CLEAN TOWEL.   10. Wear CLEAN PAJAMAS   11. Place CLEAN SHEETS on your bed the night of your first shower and DO NOT SLEEP WITH PETS.    Day of Surgery: Do not apply any deodorants/lotions. Please wear clean clothes to the hospital/surgery center.

## 2017-07-04 DIAGNOSIS — C50919 Malignant neoplasm of unspecified site of unspecified female breast: Secondary | ICD-10-CM | POA: Insufficient documentation

## 2017-07-04 NOTE — Progress Notes (Signed)
[place] [facility] Visit  06/28/2017            Patient name: [name] MRN [id number]  Date of birth: 02-03-1941  CC & HPI:  [name] is a 76 y.o. female presenting today for discussion of recent dx of left breast invasive ductal carcinoma, grade 1-2. Dr. Donne Hazel will be performing a partial mastectomy. Pt wants to discuss radiation possibilities. She has been going to the [facility] for appointments. Pt has hx of uterine cancer 5.5 years ago.   ROS:  ROS  -fever -pain All systems are negative except as noted in the HPI and PMH.   Pertinent History Reviewed:   Reviewed: Significant for endometrial cancer, breast cancer Medical         Past Medical History:  Diagnosis Date  . Anemia 04/02/2015  . Arthritis   . Atypical glandular cells on vaginal Papanicolaou smear 04/12/2016   Well get colpo and biopsy with JVF  . Bronchitis, chronic obstructive ([place]) since 2000's  . Cancer [facility])    dx with endometrial cancer   . Dysrhythmia   . GERD (gastroesophageal reflux disease)   . Gout   . Heart murmur   . Hiatal hernia   . Hiatal hernia 11/19/2012  . History of endometrial cancer 04/06/2016   Sp hysterectomy 2012  . Hypertension   . Myeloproliferative disorder, JAK-2 positive 01/05/2014  . Obesity   . Obesity, morbid (more than 100 lbs over ideal weight or BMI > 40) ([facility]) 08/2011   Ht 5'7", wt 280 lb  . Pneumonia 09/2014  . Polycythemia vera(238.4) 01/18/2014  . PONV (postoperative nausea and vomiting)    post-op hypoxic resp failure 09/2011 and require Bi-Pap (possible r/t - pressure pulm edema vs asp PNA, COPD exacerbation, obesity hypoventilation  . Shortness of breath    unable to lay flat due to dyspnea  . Vaginal Pap smear, abnormal                               Surgical Hx:    Past Surgical History:  Procedure Laterality Date  . ABDOMINAL HYSTERECTOMY  09/12/2011   Procedure: HYSTERECTOMY ABDOMINAL;  Surgeon: Janie Morning, MD;  [facility];   Service: Gynecology;  Laterality: N/A;  Total Abdominal Hysterectomy, Bilateral Salpingo Oophorectomy  . COLONOSCOPY  07/05/2012   Procedure: COLONOSCOPY;  Surgeon: Rogene Houston, MD;  [facility];  Service: Endoscopy;  Laterality: N/A;  730  . FLEXIBLE SIGMOIDOSCOPY N/A 04/03/2015   Procedure: FLEXIBLE SIGMOIDOSCOPY;  Surgeon: Rogene Houston, MD;  [facility];  Service: Endoscopy;  Laterality: N/A;  . HYSTEROSCOPY W/D&C  08/15/2011   Procedure: DILATATION AND CURETTAGE (D&C) /HYSTEROSCOPY;  Surgeon: Jonnie Kind, MD;  [facility];  Service: Gynecology;  Laterality: N/A;  With Suction Curette  . KNEE ARTHROPLASTY Left 03/15/2015   Procedure: COMPUTER ASSISTED TOTAL KNEE ARTHROPLASTY;  Surgeon: Marybelle Killings, MD;  Location: [place];  Service: Orthopedics;  Laterality: Left;  . SALPINGOOPHORECTOMY  09/12/2011   Procedure: SALPINGO OOPHERECTOMY;  Surgeon: Janie Morning, MD;  [facility];  Service: Gynecology;  Laterality: Bilateral;   Medications: Reviewed & Updated - see associated section                       Current Outpatient Prescriptions:  .  aspirin EC 81 MG tablet, Take 81 mg by mouth daily.,  Disp: , Rfl:  .  atenolol (TENORMIN) 50 MG tablet, Take 25 mg by mouth 2 (two) times daily. , Disp: , Rfl:  .  cloNIDine (CATAPRES) 0.3 MG tablet, Take 0.15 mg by mouth 2 (two) times daily. , Disp: , Rfl:  .  diclofenac (VOLTAREN) 75 MG EC tablet, Take 1 tablet (75 mg total) by mouth 2 (two) times daily., Disp: 60 tablet, Rfl: 3 .  diltiazem (CARDIZEM CD) 180 MG 24 hr capsule, Take 180 mg by mouth at bedtime. , Disp: , Rfl:  .  esomeprazole (NEXIUM) 40 MG capsule, Take 1 capsule (40 mg total) by mouth every morning. (Patient taking differently: Take 40 mg by mouth at bedtime. ), Disp: 30 capsule, Rfl: 11 .  hydroxyurea (HYDREA) 500 MG capsule, TAKE TWO CAPSULES BY MOUTH ONCE DAILY, Disp: 180 capsule, Rfl: 1 .  losartan (COZAAR) 100 MG tablet, Take 100 mg by mouth  daily. , Disp: , Rfl:  .  Simethicone (GAS RELIEF PO), Take 1 tablet by mouth as needed (gas). , Disp: , Rfl:  .  simvastatin (ZOCOR) 10 MG tablet, Take 10 mg by mouth daily after supper., Disp: , Rfl:  .  traZODone (DESYREL) 50 MG tablet, Take 1 tablet by mouth at bedtime., Disp: , Rfl:  .  albuterol (PROVENTIL HFA;VENTOLIN HFA) 108 (90 Base) MCG/ACT inhaler, Inhale 2 puffs into the lungs every 6 (six) hours as needed for wheezing or shortness of breath. Reported on 02/07/2016 (Patient not taking: Reported on 06/28/2017), Disp: 1 Inhaler, Rfl: 0 .  docusate sodium (COLACE) 100 MG capsule, Take 100 mg by mouth as needed. Reported on 02/07/2016, Disp: , Rfl:    Social History: Reviewed -  reports that she has never smoked. She has never used smokeless tobacco.  Objective Findings:  Vitals: Blood pressure 130/60, pulse 80, weight 286 lb 12.8 oz (130.1 kg).  Physical Examination: General appearance - alert, well appearing, and in no distress Mental status - alert, oriented to person, place, and time Pelvic - not indicated  Discussion: Pt pathology reviewed, invasive ductoral carcinoma of left breast, grade 1-2. Test results show estrogen recepter positive, PR negative on initial testing. Pt's upcoming surgery was discussed, as well as the possibility/function of radiation therapy.    Assessment & Plan:   A:  1. New diagnosis of left invasive breast caner, scheduled for partial mastectomy with Dr. Donne Hazel 2. 5.5 years since endometrial cancer with negative f/u  P:  1. Discuss monitoring GYN after 5 years with general oncology 2. f/u in 3 months     By signing my name below, I, [name], attest that this documentation has been prepared under the direction and in the presence of Jonnie Kind, MD. Electronically Signed: Jabier Gauss, Medical Scribe. 06/28/17. 9:24 AM.  I personally performed the services described in this documentation, which was SCRIBED in my presence. The recorded  information has been reviewed and considered accurate. It has been edited as necessary during review. Jonnie Kind, MD

## 2017-07-05 DIAGNOSIS — C50412 Malignant neoplasm of upper-outer quadrant of left female breast: Secondary | ICD-10-CM | POA: Diagnosis not present

## 2017-07-05 DIAGNOSIS — Z170 Estrogen receptor positive status [ER+]: Secondary | ICD-10-CM | POA: Diagnosis not present

## 2017-07-06 ENCOUNTER — Ambulatory Visit
Admission: RE | Admit: 2017-07-06 | Discharge: 2017-07-06 | Disposition: A | Discharge status: Home / Self Care | Payer: Medicare Other | Source: Ambulatory Visit | Attending: General Surgery | Admitting: General Surgery

## 2017-07-06 DIAGNOSIS — C50412 Malignant neoplasm of upper-outer quadrant of left female breast: Secondary | ICD-10-CM

## 2017-07-06 DIAGNOSIS — Z170 Estrogen receptor positive status [ER+]: Principal | ICD-10-CM

## 2017-07-08 MED ORDER — DEXTROSE 5 % IV SOLN
3.0000 g | INTRAVENOUS | Status: AC
  Administered 2017-07-10: 3 g via INTRAVENOUS
  Filled 2017-07-08: qty 3000

## 2017-07-10 ENCOUNTER — Ambulatory Visit (HOSPITAL_COMMUNITY): Payer: Medicare Other | Admitting: Vascular Surgery

## 2017-07-10 ENCOUNTER — Encounter (HOSPITAL_COMMUNITY)
Admission: AD | Disposition: A | Discharge status: Home Health | Payer: Self-pay | Source: Ambulatory Visit | Attending: General Surgery

## 2017-07-10 ENCOUNTER — Ambulatory Visit (HOSPITAL_COMMUNITY)
Admission: RE | Admit: 2017-07-10 | Discharge: 2017-07-10 | Disposition: A | Discharge status: Home / Self Care | Payer: Medicare Other | Source: Ambulatory Visit | Attending: General Surgery | Admitting: General Surgery

## 2017-07-10 ENCOUNTER — Ambulatory Visit
Admission: RE | Admit: 2017-07-10 | Discharge: 2017-07-10 | Disposition: A | Discharge status: Home / Self Care | Payer: Medicare Other | Source: Ambulatory Visit | Attending: General Surgery | Admitting: General Surgery

## 2017-07-10 ENCOUNTER — Inpatient Hospital Stay (HOSPITAL_COMMUNITY)
Admission: AD | Admit: 2017-07-10 | Discharge: 2017-07-12 | DRG: 579 | Disposition: A | Discharge status: Home Health | Payer: Medicare Other | Source: Ambulatory Visit | Attending: General Surgery | Admitting: General Surgery

## 2017-07-10 ENCOUNTER — Encounter (HOSPITAL_COMMUNITY): Payer: Medicare Other

## 2017-07-10 ENCOUNTER — Observation Stay (HOSPITAL_COMMUNITY): Payer: Medicare Other

## 2017-07-10 ENCOUNTER — Ambulatory Visit (HOSPITAL_COMMUNITY): Payer: Medicare Other | Admitting: Certified Registered"

## 2017-07-10 DIAGNOSIS — C50412 Malignant neoplasm of upper-outer quadrant of left female breast: Secondary | ICD-10-CM

## 2017-07-10 DIAGNOSIS — R739 Hyperglycemia, unspecified: Secondary | ICD-10-CM | POA: Diagnosis present

## 2017-07-10 DIAGNOSIS — R0902 Hypoxemia: Secondary | ICD-10-CM | POA: Diagnosis not present

## 2017-07-10 DIAGNOSIS — E669 Obesity, unspecified: Secondary | ICD-10-CM | POA: Diagnosis present

## 2017-07-10 DIAGNOSIS — Z6841 Body Mass Index (BMI) 40.0 and over, adult: Secondary | ICD-10-CM

## 2017-07-10 DIAGNOSIS — I110 Hypertensive heart disease with heart failure: Secondary | ICD-10-CM | POA: Diagnosis present

## 2017-07-10 DIAGNOSIS — D649 Anemia, unspecified: Secondary | ICD-10-CM | POA: Diagnosis present

## 2017-07-10 DIAGNOSIS — Z8542 Personal history of malignant neoplasm of other parts of uterus: Secondary | ICD-10-CM

## 2017-07-10 DIAGNOSIS — Z7982 Long term (current) use of aspirin: Secondary | ICD-10-CM | POA: Diagnosis not present

## 2017-07-10 DIAGNOSIS — J9601 Acute respiratory failure with hypoxia: Secondary | ICD-10-CM | POA: Diagnosis present

## 2017-07-10 DIAGNOSIS — Z79899 Other long term (current) drug therapy: Secondary | ICD-10-CM

## 2017-07-10 DIAGNOSIS — C50912 Malignant neoplasm of unspecified site of left female breast: Secondary | ICD-10-CM | POA: Diagnosis not present

## 2017-07-10 DIAGNOSIS — R918 Other nonspecific abnormal finding of lung field: Secondary | ICD-10-CM | POA: Diagnosis not present

## 2017-07-10 DIAGNOSIS — J449 Chronic obstructive pulmonary disease, unspecified: Secondary | ICD-10-CM | POA: Diagnosis not present

## 2017-07-10 DIAGNOSIS — G8918 Other acute postprocedural pain: Secondary | ICD-10-CM | POA: Diagnosis not present

## 2017-07-10 DIAGNOSIS — Z803 Family history of malignant neoplasm of breast: Secondary | ICD-10-CM

## 2017-07-10 DIAGNOSIS — K449 Diaphragmatic hernia without obstruction or gangrene: Secondary | ICD-10-CM | POA: Diagnosis present

## 2017-07-10 DIAGNOSIS — C773 Secondary and unspecified malignant neoplasm of axilla and upper limb lymph nodes: Secondary | ICD-10-CM | POA: Diagnosis not present

## 2017-07-10 DIAGNOSIS — N6012 Diffuse cystic mastopathy of left breast: Secondary | ICD-10-CM | POA: Diagnosis not present

## 2017-07-10 DIAGNOSIS — K219 Gastro-esophageal reflux disease without esophagitis: Secondary | ICD-10-CM | POA: Diagnosis present

## 2017-07-10 DIAGNOSIS — D7589 Other specified diseases of blood and blood-forming organs: Secondary | ICD-10-CM | POA: Diagnosis not present

## 2017-07-10 DIAGNOSIS — M109 Gout, unspecified: Secondary | ICD-10-CM | POA: Diagnosis not present

## 2017-07-10 DIAGNOSIS — Z888 Allergy status to other drugs, medicaments and biological substances status: Secondary | ICD-10-CM

## 2017-07-10 DIAGNOSIS — K559 Vascular disorder of intestine, unspecified: Secondary | ICD-10-CM | POA: Diagnosis not present

## 2017-07-10 DIAGNOSIS — C50911 Malignant neoplasm of unspecified site of right female breast: Secondary | ICD-10-CM | POA: Diagnosis not present

## 2017-07-10 DIAGNOSIS — I5033 Acute on chronic diastolic (congestive) heart failure: Secondary | ICD-10-CM | POA: Diagnosis present

## 2017-07-10 DIAGNOSIS — Z882 Allergy status to sulfonamides status: Secondary | ICD-10-CM | POA: Diagnosis not present

## 2017-07-10 DIAGNOSIS — I10 Essential (primary) hypertension: Secondary | ICD-10-CM | POA: Diagnosis present

## 2017-07-10 DIAGNOSIS — Z96652 Presence of left artificial knee joint: Secondary | ICD-10-CM | POA: Diagnosis not present

## 2017-07-10 DIAGNOSIS — Z170 Estrogen receptor positive status [ER+]: Principal | ICD-10-CM

## 2017-07-10 DIAGNOSIS — J9589 Other postprocedural complications and disorders of respiratory system, not elsewhere classified: Secondary | ICD-10-CM | POA: Diagnosis not present

## 2017-07-10 DIAGNOSIS — D45 Polycythemia vera: Secondary | ICD-10-CM | POA: Diagnosis present

## 2017-07-10 DIAGNOSIS — C50919 Malignant neoplasm of unspecified site of unspecified female breast: Secondary | ICD-10-CM | POA: Diagnosis present

## 2017-07-10 DIAGNOSIS — D471 Chronic myeloproliferative disease: Secondary | ICD-10-CM | POA: Diagnosis present

## 2017-07-10 DIAGNOSIS — R928 Other abnormal and inconclusive findings on diagnostic imaging of breast: Secondary | ICD-10-CM | POA: Diagnosis not present

## 2017-07-10 DIAGNOSIS — Z90710 Acquired absence of both cervix and uterus: Secondary | ICD-10-CM

## 2017-07-10 HISTORY — PX: BREAST LUMPECTOMY: SHX2

## 2017-07-10 HISTORY — PX: BREAST LUMPECTOMY WITH RADIOACTIVE SEED AND SENTINEL LYMPH NODE BIOPSY: SHX6550

## 2017-07-10 SURGERY — BREAST LUMPECTOMY WITH RADIOACTIVE SEED AND SENTINEL LYMPH NODE BIOPSY
Anesthesia: General | Site: Breast | Laterality: Left

## 2017-07-10 MED ORDER — PROPOFOL 10 MG/ML IV BOLUS
INTRAVENOUS | Status: DC | PRN
  Administered 2017-07-10: 50 mg via INTRAVENOUS
  Administered 2017-07-10: 150 mg via INTRAVENOUS

## 2017-07-10 MED ORDER — FENTANYL CITRATE (PF) 100 MCG/2ML IJ SOLN
25.0000 ug | INTRAMUSCULAR | Status: DC | PRN
  Administered 2017-07-10: 25 ug via INTRAVENOUS

## 2017-07-10 MED ORDER — PROPOFOL 10 MG/ML IV BOLUS
INTRAVENOUS | Status: AC
  Filled 2017-07-10: qty 20

## 2017-07-10 MED ORDER — FENTANYL CITRATE (PF) 250 MCG/5ML IJ SOLN
INTRAMUSCULAR | Status: DC | PRN
  Administered 2017-07-10: 50 ug via INTRAVENOUS

## 2017-07-10 MED ORDER — SIMETHICONE 80 MG PO CHEW: 80.0000 mg | CHEWABLE_TABLET | Freq: Four times a day (QID) | ORAL | Status: DC | PRN

## 2017-07-10 MED ORDER — ALBUTEROL SULFATE (2.5 MG/3ML) 0.083% IN NEBU: 2.5000 mg | INHALATION_SOLUTION | Freq: Four times a day (QID) | RESPIRATORY_TRACT | Status: DC | PRN

## 2017-07-10 MED ORDER — SODIUM CHLORIDE 0.9 % IN NEBU
INHALATION_SOLUTION | RESPIRATORY_TRACT | Status: AC
  Filled 2017-07-10: qty 3

## 2017-07-10 MED ORDER — SCOPOLAMINE 1 MG/3DAYS TD PT72
MEDICATED_PATCH | TRANSDERMAL | Status: AC
Start: 2017-07-10 — End: 2017-07-10
  Administered 2017-07-10: 1.5 mg
  Filled 2017-07-10: qty 1

## 2017-07-10 MED ORDER — ALBUTEROL SULFATE HFA 108 (90 BASE) MCG/ACT IN AERS
INHALATION_SPRAY | RESPIRATORY_TRACT | Status: DC | PRN
  Administered 2017-07-10 (×6): 2 via RESPIRATORY_TRACT

## 2017-07-10 MED ORDER — ACETAMINOPHEN 10 MG/ML IV SOLN
INTRAVENOUS | Status: AC
  Administered 2017-07-10: 1000 mg via INTRAVENOUS
  Filled 2017-07-10: qty 100

## 2017-07-10 MED ORDER — LACTATED RINGERS IV SOLN
INTRAVENOUS | Status: DC
  Administered 2017-07-10: 15:00:00 via INTRAVENOUS
  Administered 2017-07-10: 50 mL/h via INTRAVENOUS

## 2017-07-10 MED ORDER — DICLOFENAC SODIUM 75 MG PO TBEC
75.0000 mg | DELAYED_RELEASE_TABLET | Freq: Every evening | ORAL | Status: DC
  Administered 2017-07-11 – 2017-07-12 (×2): 75 mg via ORAL
  Filled 2017-07-10 (×2): qty 1

## 2017-07-10 MED ORDER — DEXAMETHASONE SODIUM PHOSPHATE 10 MG/ML IJ SOLN
INTRAMUSCULAR | Status: DC | PRN
  Administered 2017-07-10: 10 mg via INTRAVENOUS

## 2017-07-10 MED ORDER — SODIUM CHLORIDE 0.9 % IV SOLN
INTRAVENOUS | Status: DC
  Administered 2017-07-10: 50 mL/h via INTRAVENOUS

## 2017-07-10 MED ORDER — HEMOSTATIC AGENTS (NO CHARGE) OPTIME
TOPICAL | Status: DC | PRN
  Administered 2017-07-10: 1 via TOPICAL

## 2017-07-10 MED ORDER — PHENYLEPHRINE HCL 10 MG/ML IJ SOLN
INTRAVENOUS | Status: DC | PRN
  Administered 2017-07-10: 40 ug/min via INTRAVENOUS

## 2017-07-10 MED ORDER — EPHEDRINE 5 MG/ML INJ
INTRAVENOUS | Status: AC
  Filled 2017-07-10: qty 10

## 2017-07-10 MED ORDER — PANTOPRAZOLE SODIUM 40 MG PO TBEC
40.0000 mg | DELAYED_RELEASE_TABLET | Freq: Every day | ORAL | Status: DC
  Administered 2017-07-11 – 2017-07-12 (×2): 40 mg via ORAL
  Filled 2017-07-10 (×2): qty 1

## 2017-07-10 MED ORDER — OXYCODONE HCL 5 MG PO TABS: 5.0000 mg | ORAL_TABLET | ORAL | Status: DC | PRN

## 2017-07-10 MED ORDER — MIDAZOLAM HCL 2 MG/2ML IJ SOLN
INTRAMUSCULAR | Status: AC
Start: 2017-07-10 — End: 2017-07-10
  Administered 2017-07-10: 1 mg
  Filled 2017-07-10: qty 2

## 2017-07-10 MED ORDER — ONDANSETRON 4 MG PO TBDP: 4.0000 mg | ORAL_TABLET | Freq: Four times a day (QID) | ORAL | Status: DC | PRN

## 2017-07-10 MED ORDER — CLONIDINE HCL 0.1 MG PO TABS
0.1500 mg | ORAL_TABLET | Freq: Two times a day (BID) | ORAL | Status: DC
  Administered 2017-07-11 – 2017-07-12 (×3): 0.15 mg via ORAL
  Filled 2017-07-10 (×4): qty 2

## 2017-07-10 MED ORDER — ONDANSETRON HCL 4 MG/2ML IJ SOLN
INTRAMUSCULAR | Status: DC | PRN
  Administered 2017-07-10: 4 mg via INTRAVENOUS

## 2017-07-10 MED ORDER — ONDANSETRON HCL 4 MG/2ML IJ SOLN
INTRAMUSCULAR | Status: AC
  Filled 2017-07-10: qty 2

## 2017-07-10 MED ORDER — MORPHINE SULFATE (PF) 2 MG/ML IV SOLN: 2.0000 mg | INTRAVENOUS | Status: DC | PRN

## 2017-07-10 MED ORDER — ONDANSETRON HCL 4 MG/2ML IJ SOLN: 4.0000 mg | Freq: Four times a day (QID) | INTRAMUSCULAR | Status: DC | PRN

## 2017-07-10 MED ORDER — OXYCODONE HCL 5 MG/5ML PO SOLN: 5.0000 mg | Freq: Once | ORAL | Status: DC | PRN

## 2017-07-10 MED ORDER — SUCCINYLCHOLINE CHLORIDE 20 MG/ML IJ SOLN
INTRAMUSCULAR | Status: DC | PRN
  Administered 2017-07-10: 100 mg via INTRAVENOUS

## 2017-07-10 MED ORDER — FENTANYL CITRATE (PF) 100 MCG/2ML IJ SOLN
INTRAMUSCULAR | Status: AC
  Filled 2017-07-10: qty 2

## 2017-07-10 MED ORDER — GABAPENTIN 300 MG PO CAPS
ORAL_CAPSULE | ORAL | Status: AC
  Administered 2017-07-10: 300 mg
  Filled 2017-07-10: qty 1

## 2017-07-10 MED ORDER — LIDOCAINE HCL (CARDIAC) 20 MG/ML IV SOLN
INTRAVENOUS | Status: DC | PRN
  Administered 2017-07-10: 100 mg via INTRAVENOUS

## 2017-07-10 MED ORDER — BUPIVACAINE-EPINEPHRINE 0.25% -1:200000 IJ SOLN
INTRAMUSCULAR | Status: DC | PRN
  Administered 2017-07-10: 4 mL

## 2017-07-10 MED ORDER — ALBUTEROL SULFATE (2.5 MG/3ML) 0.083% IN NEBU
INHALATION_SOLUTION | RESPIRATORY_TRACT | Status: AC
  Administered 2017-07-10: 2.5 mg
  Filled 2017-07-10: qty 3

## 2017-07-10 MED ORDER — METHOCARBAMOL 500 MG PO TABS
500.0000 mg | ORAL_TABLET | Freq: Four times a day (QID) | ORAL | Status: DC | PRN
  Filled 2017-07-10: qty 1

## 2017-07-10 MED ORDER — ACETAMINOPHEN 500 MG PO TABS
ORAL_TABLET | ORAL | Status: AC
  Administered 2017-07-10: 1000 mg
  Filled 2017-07-10: qty 2

## 2017-07-10 MED ORDER — BUPIVACAINE-EPINEPHRINE (PF) 0.25% -1:200000 IJ SOLN
INTRAMUSCULAR | Status: AC
  Filled 2017-07-10: qty 30

## 2017-07-10 MED ORDER — FENTANYL CITRATE (PF) 100 MCG/2ML IJ SOLN
INTRAMUSCULAR | Status: AC
  Administered 2017-07-10: 100 ug
  Filled 2017-07-10: qty 2

## 2017-07-10 MED ORDER — ACETAMINOPHEN 160 MG/5ML PO SOLN: 325.0000 mg | ORAL | Status: DC | PRN

## 2017-07-10 MED ORDER — MORPHINE SULFATE (PF) 4 MG/ML IV SOLN: 2.0000 mg | INTRAVENOUS | Status: DC | PRN

## 2017-07-10 MED ORDER — HYDROXYUREA 500 MG PO CAPS
1000.0000 mg | ORAL_CAPSULE | Freq: Every day | ORAL | Status: DC
  Administered 2017-07-10 – 2017-07-12 (×3): 1000 mg via ORAL
  Filled 2017-07-10 (×3): qty 2

## 2017-07-10 MED ORDER — OXYCODONE HCL 5 MG PO TABS: 5.0000 mg | ORAL_TABLET | Freq: Once | ORAL | Status: DC | PRN

## 2017-07-10 MED ORDER — ACETAMINOPHEN 10 MG/ML IV SOLN
1000.0000 mg | Freq: Once | INTRAVENOUS | Status: AC
  Administered 2017-07-10: 1000 mg via INTRAVENOUS

## 2017-07-10 MED ORDER — EPHEDRINE SULFATE 50 MG/ML IJ SOLN
INTRAMUSCULAR | Status: DC | PRN
  Administered 2017-07-10 (×4): 10 mg via INTRAVENOUS

## 2017-07-10 MED ORDER — ACETAMINOPHEN 325 MG PO TABS: 325.0000 mg | ORAL_TABLET | ORAL | Status: DC | PRN

## 2017-07-10 MED ORDER — FENTANYL CITRATE (PF) 250 MCG/5ML IJ SOLN
INTRAMUSCULAR | Status: AC
  Filled 2017-07-10: qty 5

## 2017-07-10 MED ORDER — LOSARTAN POTASSIUM 50 MG PO TABS
100.0000 mg | ORAL_TABLET | Freq: Every evening | ORAL | Status: DC
  Administered 2017-07-11 – 2017-07-12 (×2): 100 mg via ORAL
  Filled 2017-07-10 (×2): qty 2

## 2017-07-10 MED ORDER — 0.9 % SODIUM CHLORIDE (POUR BTL) OPTIME
TOPICAL | Status: DC | PRN
Start: 2017-07-10 — End: 2017-07-10
  Administered 2017-07-10: 1000 mL

## 2017-07-10 MED ORDER — TECHNETIUM TC 99M SULFUR COLLOID FILTERED
1.0000 | Freq: Once | INTRAVENOUS | Status: AC | PRN
  Administered 2017-07-10: 1 via INTRADERMAL

## 2017-07-10 MED ORDER — ACETAMINOPHEN 500 MG PO TABS
1000.0000 mg | ORAL_TABLET | Freq: Four times a day (QID) | ORAL | Status: DC
  Administered 2017-07-11 – 2017-07-12 (×8): 1000 mg via ORAL
  Filled 2017-07-10 (×8): qty 2

## 2017-07-10 MED ORDER — DILTIAZEM HCL ER COATED BEADS 180 MG PO CP24
180.0000 mg | ORAL_CAPSULE | Freq: Every day | ORAL | Status: DC
  Administered 2017-07-11 – 2017-07-12 (×2): 180 mg via ORAL
  Filled 2017-07-10 (×2): qty 1

## 2017-07-10 MED ORDER — SUCCINYLCHOLINE CHLORIDE 200 MG/10ML IV SOSY
PREFILLED_SYRINGE | INTRAVENOUS | Status: AC
  Filled 2017-07-10: qty 10

## 2017-07-10 MED ORDER — ROPIVACAINE HCL 7.5 MG/ML IJ SOLN
INTRAMUSCULAR | Status: DC | PRN
  Administered 2017-07-10: 20 mL via PERINEURAL

## 2017-07-10 MED ORDER — PROPOFOL 500 MG/50ML IV EMUL
INTRAVENOUS | Status: DC | PRN
  Administered 2017-07-10: 200 ug/kg/min via INTRAVENOUS

## 2017-07-10 MED ORDER — DEXAMETHASONE SODIUM PHOSPHATE 10 MG/ML IJ SOLN
INTRAMUSCULAR | Status: AC
  Filled 2017-07-10: qty 1

## 2017-07-10 MED ORDER — ATENOLOL 25 MG PO TABS
25.0000 mg | ORAL_TABLET | Freq: Two times a day (BID) | ORAL | Status: DC
  Administered 2017-07-11 – 2017-07-12 (×2): 25 mg via ORAL
  Filled 2017-07-10 (×4): qty 1

## 2017-07-10 SURGICAL SUPPLY — 48 items
APPLIER CLIP 9.375 MED OPEN (MISCELLANEOUS) ×2
BINDER BREAST XXLRG (GAUZE/BANDAGES/DRESSINGS) ×2 IMPLANT
BLADE SURG 15 STRL LF DISP TIS (BLADE) ×1 IMPLANT
BLADE SURG 15 STRL SS (BLADE) ×1
CANISTER SUCT 3000ML PPV (MISCELLANEOUS) ×2 IMPLANT
CHLORAPREP W/TINT 26ML (MISCELLANEOUS) ×2 IMPLANT
CLIP APPLIE 9.375 MED OPEN (MISCELLANEOUS) ×1 IMPLANT
CONT SPEC 4OZ CLIKSEAL STRL BL (MISCELLANEOUS) ×2 IMPLANT
COVER PROBE W GEL 5X96 (DRAPES) ×2 IMPLANT
COVER SURGICAL LIGHT HANDLE (MISCELLANEOUS) ×2 IMPLANT
DERMABOND ADVANCED (GAUZE/BANDAGES/DRESSINGS) ×1
DERMABOND ADVANCED .7 DNX12 (GAUZE/BANDAGES/DRESSINGS) ×1 IMPLANT
DEVICE DUBIN SPECIMEN MAMMOGRA (MISCELLANEOUS) ×2 IMPLANT
DRAPE CHEST BREAST 15X10 FENES (DRAPES) ×2 IMPLANT
DRAPE UTILITY XL STRL (DRAPES) ×2 IMPLANT
ELECT COATED BLADE 2.86 ST (ELECTRODE) ×2 IMPLANT
ELECT REM PT RETURN 9FT ADLT (ELECTROSURGICAL) ×2
ELECTRODE REM PT RTRN 9FT ADLT (ELECTROSURGICAL) ×1 IMPLANT
GLOVE BIO SURGEON STRL SZ7 (GLOVE) ×4 IMPLANT
GLOVE BIOGEL PI IND STRL 7.5 (GLOVE) ×1 IMPLANT
GLOVE BIOGEL PI INDICATOR 7.5 (GLOVE) ×1
GOWN STRL REUS W/ TWL LRG LVL3 (GOWN DISPOSABLE) ×2 IMPLANT
GOWN STRL REUS W/TWL LRG LVL3 (GOWN DISPOSABLE) ×2
HEMOSTAT ARISTA ABSORB 3G PWDR (MISCELLANEOUS) ×2 IMPLANT
ILLUMINATOR WAVEGUIDE N/F (MISCELLANEOUS) ×2 IMPLANT
KIT BASIN OR (CUSTOM PROCEDURE TRAY) ×2 IMPLANT
KIT MARKER MARGIN INK (KITS) ×2 IMPLANT
MARKER SKIN DUAL TIP RULER LAB (MISCELLANEOUS) ×2 IMPLANT
NDL SAFETY ECLIPSE 18X1.5 (NEEDLE) ×1 IMPLANT
NEEDLE HYPO 18GX1.5 SHARP (NEEDLE) ×1
NEEDLE HYPO 25GX1X1/2 BEV (NEEDLE) ×2 IMPLANT
NS IRRIG 1000ML POUR BTL (IV SOLUTION) ×2 IMPLANT
PACK SURGICAL SETUP 50X90 (CUSTOM PROCEDURE TRAY) ×2 IMPLANT
PENCIL BUTTON HOLSTER BLD 10FT (ELECTRODE) ×2 IMPLANT
SPONGE LAP 18X18 X RAY DECT (DISPOSABLE) ×2 IMPLANT
STRIP CLOSURE SKIN 1/2X4 (GAUZE/BANDAGES/DRESSINGS) ×2 IMPLANT
SUT MNCRL AB 4-0 PS2 18 (SUTURE) ×4 IMPLANT
SUT MON AB 5-0 PS2 18 (SUTURE) ×2 IMPLANT
SUT SILK 2 0 SH (SUTURE) ×6 IMPLANT
SUT VIC AB 2-0 SH 27 (SUTURE) ×4
SUT VIC AB 2-0 SH 27XBRD (SUTURE) ×4 IMPLANT
SUT VIC AB 3-0 SH 27 (SUTURE) ×3
SUT VIC AB 3-0 SH 27X BRD (SUTURE) ×3 IMPLANT
SYR BULB 3OZ (MISCELLANEOUS) ×2 IMPLANT
SYR CONTROL 10ML LL (SYRINGE) ×2 IMPLANT
TOWEL OR 17X24 6PK STRL BLUE (TOWEL DISPOSABLE) ×2 IMPLANT
TUBE CONNECTING 12X1/4 (SUCTIONS) ×2 IMPLANT
YANKAUER SUCT BULB TIP NO VENT (SUCTIONS) ×2 IMPLANT

## 2017-07-10 NOTE — Anesthesia Postprocedure Evaluation (Signed)
Anesthesia Post Note  Patient: [name]  Procedure(s) Performed: Procedure(s) (LRB): LEFT BREAST LUMPECTOMY WITH RADIOACTIVE SEED AND SENTINEL LYMPH NODE BIOPSY (Left)     Patient location during evaluation: PACU Anesthesia Type: General Level of consciousness: awake, awake and alert and oriented Pain management: pain level controlled Vital Signs Assessment: post-procedure vital signs reviewed and stable Respiratory status: spontaneous breathing, nonlabored ventilation, respiratory function stable and patient connected to nasal cannula oxygen Cardiovascular status: blood pressure returned to baseline Anesthetic complications: no    Last Vitals:  Vitals:   07/10/17 1625 07/10/17 1640  BP: 102/66 (!) 115/55  Pulse: 65 63  Resp: (!) 23 18  Temp:    SpO2: 95% 95%    Last Pain:  Vitals:   07/10/17 1610  PainSc: Asleep                 Starling Christofferson COKER

## 2017-07-10 NOTE — Op Note (Signed)
Preoperative diagnosis: clinical stage I left breast cancer Postoperative diagnosis: same as above Procedure: Leftbreast seed guided lumpectomy Leftdeep axillary sentinel node biopsy Surgeon: Dr Serita Grammes EBL: minimal Anes: general  Specimens  1. Left breast tissue marked with paint 2. Leftaxillary sentinel nodes with highest count 721 3. Additional lateral, superior and inferior marginsmarked short superior, long lateral double deep Complications none Drains none Sponge count correct Dispo to pacu stable  Indications: This is a 24yof with clinical stage I left breast cancer. She has elected to proceed with lumpectomy and sentinel node biopsy. She had a radioactive seed placed prior to beginning.   Procedure: After informed consent was obtained the patient was taken to the operating room. She first was given technetium in standard periareolar fashion. She had a pectoral block. She was given antibiotics. Sequential compression devices were on her legs. She was then placed under general anesthesia with an LMA. Then she was prepped and draped in the standard sterile surgical fashion. Surgical timeout was then performed.  I then located the seed in the lateral left breast. I infiltrated marcaine in the skin and then made a periareolar incision in an attempt to hide the scar later. I tunneled to the seed and the mass using the lighted retractor.  I then used the neoprobe to remove the seed and the surrounding tissue with attempt to get clear margins. I marked this with paint. MM confirmed removal of seed and clip. I did remove additional inferior, superior and lateral margins as I thought these might be close.  I placed clips in the cavity.  I then obtained hemostasis. I closed this with 2-0 vicryl, 3-0 vicryl and 5-0 monocryl. Glue and steristrips were applied.   I then made an incision in the axilla and carried this through the axillary fascia. I then located the sentinel  nodes. I excised the radioactive nodes. There were no enlarged nodes. The background radioactivity was zero. I then obtained hemostasis. There was some bleeding in the axilla that I ended up enlarging my incision to stop with suture. This was very difficult due to her habitus.I closed the fascia with 2-0 vicryl. The skin was closed with 3-0 vicryl and 4-0 monocryl. Glue and steristrips were applied. A binder was placed. She was extubated and transferred to PACU.

## 2017-07-10 NOTE — Anesthesia Procedure Notes (Signed)
Procedure Name: Intubation Date/Time: 07/10/2017 1:53 PM Performed by: Lance Coon Pre-anesthesia Checklist: Patient identified, Emergency Drugs available, Suction available, Patient being monitored and Timeout performed Patient Re-evaluated:Patient Re-evaluated prior to induction Oxygen Delivery Method: Circle system utilized Preoxygenation: Pre-oxygenation with 100% oxygen Induction Type: IV induction Laryngoscope Size: Glidescope and 4 Grade View: Grade I Tube type: Oral Tube size: 7.0 mm Number of attempts: 1 Airway Equipment and Method: Video-laryngoscopy Placement Confirmation: ETT inserted through vocal cords under direct vision,  positive ETCO2 and breath sounds checked- equal and bilateral Secured at: 21 cm Tube secured with: Tape Dental Injury: Teeth and Oropharynx as per pre-operative assessment

## 2017-07-10 NOTE — Interval H&P Note (Signed)
History and Physical Interval Note:  07/10/2017 1:33 PM  [name]  has presented today for surgery, with the diagnosis of LEFT BREAST CANCER  The various methods of treatment have been discussed with the patient and family. After consideration of risks, benefits and other options for treatment, the patient has consented to  Procedure(s): LEFT BREAST LUMPECTOMY WITH RADIOACTIVE SEED AND SENTINEL LYMPH NODE BIOPSY (Left) as a surgical intervention .  The patient's history has been reviewed, patient examined, no change in status, stable for surgery.  I have reviewed the patient's chart and labs.  Questions were answered to the patient's satisfaction.     Jamelle Goldston

## 2017-07-10 NOTE — Anesthesia Procedure Notes (Signed)
Anesthesia Regional Block: Pectoralis block   Pre-Anesthetic Checklist: ,, timeout performed, Correct Patient, Correct Site, Correct Laterality, Correct Procedure, Correct Position, site marked, Risks and benefits discussed,  Surgical consent,  Pre-op evaluation,  At surgeon's request and post-op pain management  Laterality: Left  Prep: chloraprep       Needles:  Injection technique: Single-shot  Needle Type: Echogenic Stimulator Needle          Additional Needles:   Procedures: ultrasound guided,,,,,,,,  Narrative:  Start time: 07/10/2017 1:18 PM End time: 07/10/2017 1:24 PM Injection made incrementally with aspirations every 5 mL.  Performed by: Personally  Anesthesiologist: Tabetha Haraway  Additional Notes: H+P and labs reviewed, risks and benefits discussed with patient, procedure tolerated well without complications

## 2017-07-10 NOTE — Transfer of Care (Signed)
Immediate Anesthesia Transfer of Care Note  Patient: [name]  Procedure(s) Performed: Procedure(s): LEFT BREAST LUMPECTOMY WITH RADIOACTIVE SEED AND SENTINEL LYMPH NODE BIOPSY (Left)  Patient Location: PACU  Anesthesia Type:General  Level of Consciousness: awake and patient cooperative  Airway & Oxygen Therapy: Patient Spontanous Breathing and Patient connected to face mask oxygen  Post-op Assessment: Report given to RN and Post -op Vital signs reviewed and stable  Post vital signs: Reviewed and stable  Last Vitals:  Vitals:   07/10/17 1108 07/10/17 1300  BP: (!) 194/81   Pulse: 65 63  Resp: 20 (!) 22  Temp: 36.6 C   SpO2: 97% 95%    Last Pain:  Vitals:   07/10/17 1330  PainSc: 0-No pain      Patients Stated Pain Goal: 3 (12/06/41 8887)  Complications: No apparent anesthesia complications

## 2017-07-10 NOTE — Anesthesia Preprocedure Evaluation (Signed)
Anesthesia Evaluation  Patient identified by MRN, date of birth, ID band Patient awake    Reviewed: Allergy & Precautions, NPO status , Patient's Chart, lab work & pertinent test results, reviewed documented beta blocker date and time   History of Anesthesia Complications (+) PONV and history of anesthetic complications  Airway Mallampati: III  TM Distance: <3 FB Neck ROM: Full    Dental  (+) Partial Lower, Partial Upper   Pulmonary shortness of breath, COPD,    + rhonchi  + decreased breath sounds      Cardiovascular hypertension, Pt. on medications and Pt. on home beta blockers Normal cardiovascular exam+ dysrhythmias  Rhythm:Regular     Neuro/Psych  Neuromuscular disease negative psych ROS   GI/Hepatic Neg liver ROS, hiatal hernia, GERD  Medicated and Controlled,  Endo/Other  Morbid obesity  Renal/GU negative Renal ROS     Musculoskeletal  (+) Arthritis ,   Abdominal   Peds  Hematology   Anesthesia Other Findings   Reproductive/Obstetrics                             Anesthesia Physical Anesthesia Plan  ASA: III  Anesthesia Plan: General   Post-op Pain Management:  Regional for Post-op pain   Induction: Intravenous  PONV Risk Score and Plan: 4 or greater and Ondansetron, Dexamethasone, Midazolam, Scopolamine patch - Pre-op and Propofol infusion  Airway Management Planned: LMA  Additional Equipment: None  Intra-op Plan:   Post-operative Plan: Extubation in OR  Informed Consent: I have reviewed the patients History and Physical, chart, labs and discussed the procedure including the risks, benefits and alternatives for the proposed anesthesia with the patient or authorized representative who has indicated his/her understanding and acceptance.   Dental advisory given  Plan Discussed with: CRNA and Surgeon  Anesthesia Plan Comments:         Anesthesia Quick  Evaluation

## 2017-07-10 NOTE — Discharge Instructions (Signed)
[facility] °Office Phone Number [phone] °POST OP INSTRUCTIONS ° °Always review your discharge instruction sheet given to you by the facility where your surgery was performed. ° °IF YOU HAVE DISABILITY OR FAMILY LEAVE FORMS, YOU MUST BRING THEM TO THE OFFICE FOR PROCESSING.  DO NOT GIVE THEM TO YOUR DOCTOR. ° °1. A prescription for pain medication may be given to you upon discharge.  Take your pain medication as prescribed, if needed.  If narcotic pain medicine is not needed, then you may take acetaminophen (Tylenol), naprosyn (Alleve) or ibuprofen (Advil) as needed. °2. Take your usually prescribed medications unless otherwise directed °3. If you need a refill on your pain medication, please contact your pharmacy.  They will contact our office to request authorization.  Prescriptions will not be filled after 5pm or on week-ends. °4. You should eat very light the first 24 hours after surgery, such as soup, crackers, pudding, etc.  Resume your normal diet the day after surgery. °5. Most patients will experience some swelling and bruising in the breast.  Ice packs and a good support bra will help.  Wear the breast binder provided or a sports bra for 72 hours day and night.  After that wear a sports bra during the day until you return to the office. Swelling and bruising can take several days to resolve.  °6. It is common to experience some constipation if taking pain medication after surgery.  Increasing fluid intake and taking a stool softener will usually help or prevent this problem from occurring.  A mild laxative (Milk of Magnesia or Miralax) should be taken according to package directions if there are no bowel movements after 48 hours. °7. Unless discharge instructions indicate otherwise, you may remove your bandages 48 hours after surgery and you may shower at that time.  You may have steri-strips (small skin tapes) in place directly over the incision.  These strips should be left on the  skin for 7-10 days and will come off on their own.  If your surgeon used skin glue on the incision, you may shower in 24 hours.  The glue will flake off over the next 2-3 weeks.  Any sutures or staples will be removed at the office during your follow-up visit. °8. ACTIVITIES:  You may resume regular daily activities (gradually increasing) beginning the next day.  Wearing a good support bra or sports bra minimizes pain and swelling.  You may have sexual intercourse when it is comfortable. °a. You may drive when you no longer are taking prescription pain medication, you can comfortably wear a seatbelt, and you can safely maneuver your car and apply brakes. °b. RETURN TO WORK:  ______________________________________________________________________________________ °9. You should see your doctor in the office for a follow-up appointment approximately two weeks after your surgery.  Your doctor’s nurse will typically make your follow-up appointment when she calls you with your pathology report.  Expect your pathology report 3-4 business days after your surgery.  You may call to check if you do not hear from us after three days. °10. OTHER INSTRUCTIONS: _______________________________________________________________________________________________ _____________________________________________________________________________________________________________________________________ °_____________________________________________________________________________________________________________________________________ °_____________________________________________________________________________________________________________________________________ ° °WHEN TO CALL DR Jamarea Selner: °1. Fever over 101.0 °2. Nausea and/or vomiting. °3. Extreme swelling or bruising. °4. Continued bleeding from incision. °5. Increased pain, redness, or drainage from the incision. ° °The clinic staff is available to answer your questions during regular  business hours.  Please don’t hesitate to call and ask to speak to one of the nurses for clinical concerns.  If you   have a medical emergency, go to the nearest emergency room or call 911.  A surgeon from [facility] is always on call at the hospital. ° °For further questions, please visit [email or web address] ° °

## 2017-07-10 NOTE — H&P (Signed)
76 yof referred by Dr [name] for newly diagnosed left breast cancer. she had a screening mm with b density breasts. she had no mass or dc. she had an ill defined mass present centrally. [place] of this area shows at 230 5 cm from nipple a mass measuring 0.8x0.8x1.5 cm in size. there are also a few complicated cysts. there is no axillary lad. core biopsy was obtained that shows a grade 1/2 idc that is 5% er pos, pr negative, and her2 is negative. the Ki is 15%. she is here with on of her daughters to discuss options. she has chronic mpd that she is on hydroxyurea. she is sob when lying flat and is sob with climbing two flights of stairs. she has le edema. she has no chest pain  Past Surgical History  Breast Biopsy  Left. Cataract Surgery  Bilateral. Hysterectomy (due to cancer) - Partial  Knee Surgery  Left. Ventral / Umbilical Hernia Surgery  Left.  Diagnostic Studies History  Colonoscopy  1-5 years ago Mammogram  1-3 years ago Pap Smear  1-5 years ago  Allergies  Toprol XL *BETA BLOCKERS*  Allergies Reconciled   Medication History  Atenolol (50MG Tablet, Oral) Active. Azithromycin (250MG Tablet, Oral) Active. Cartia XT (180MG Capsule ER 24HR, Oral) Active. CloNIDine HCl (0.3MG Tablet, Oral) Active. Diclofenac Sodium (75MG Tablet DR, Oral) Active. Doxycycline Hyclate (100MG Capsule, Oral) Active. Hydroxyurea (500MG Capsule, Oral) Active. Losartan Potassium (100MG Tablet, Oral) Active. Prochlorperazine Maleate (10MG Tablet, Oral) Active. Simvastatin (10MG Tablet, Oral) Active. Aspirin (81MG Tablet, Oral) Active. Medications Reconciled  Social History Caffeine use  Carbonated beverages, Coffee. No alcohol use  No drug use  Tobacco use  Never smoker.  Family History  Alcohol Abuse  Father. Arthritis  Mother. Cancer  Father. Respiratory Condition  Father.  Pregnancy / Birth History  Gravida  5 Length (months) of breastfeeding   3-6 Maternal age  19-20 Para  4  Other Problems  Arthritis  Back Pain  Breast Cancer  Cancer  Gastroesophageal Reflux Disease  Lump In Breast   Review of Systems  General Not Present- Appetite Loss, Chills, Fatigue, Fever, Night Sweats, Weight Gain and Weight Loss. Skin Present- Dryness. Not Present- Change in Wart/Mole, Hives, Jaundice, New Lesions, Non-Healing Wounds, Rash and Ulcer. HEENT Not Present- Earache, Hearing Loss, Hoarseness, Nose Bleed, Oral Ulcers, Ringing in the Ears, Seasonal Allergies, Sinus Pain, Sore Throat, Visual Disturbances, Wears glasses/contact lenses and Yellow Eyes. Respiratory Present- Difficulty Breathing and Wheezing. Not Present- Bloody sputum, Chronic Cough and Snoring. Breast Present- Breast Mass. Not Present- Breast Pain, Nipple Discharge and Skin Changes. Cardiovascular Present- Difficulty Breathing Lying Down, Leg Cramps, Shortness of Breath and Swelling of Extremities. Not Present- Chest Pain, Palpitations and Rapid Heart Rate. Gastrointestinal Not Present- Abdominal Pain, Bloating, Bloody Stool, Change in Bowel Habits, Chronic diarrhea, Constipation, Difficulty Swallowing, Excessive gas, Gets full quickly at meals, Hemorrhoids, Indigestion, Nausea, Rectal Pain and Vomiting. Female Genitourinary Present- Nocturia. Not Present- Frequency, Painful Urination, Pelvic Pain and Urgency. Musculoskeletal Present- Back Pain. Not Present- Joint Pain, Joint Stiffness, Muscle Pain, Muscle Weakness and Swelling of Extremities. Neurological Present- Trouble walking and Weakness. Not Present- Decreased Memory, Fainting, Headaches, Numbness, Seizures, Tingling and Tremor. Psychiatric Not Present- Anxiety, Bipolar, Change in Sleep Pattern, Depression, Fearful and Frequent crying. Endocrine Not Present- Cold Intolerance, Excessive Hunger, Hair Changes, Heat Intolerance, Hot flashes and New Diabetes. Hematology Present- Blood Thinners. Not Present- Easy Bruising,  Excessive bleeding, Gland problems, HIV and Persistent Infections.  Vitals  Weight:  288 lb Height: 64in Body Surface Area: 2.28 m Body Mass Index: 49.43 kg/m  Temp.: 98.34F  Pulse: 63 (Regular)  P.OX: 90% (Room air) BP: 142/88 (Sitting, Left Arm, Standard) Physical Exam  General Mental Status-Alert. Orientation-Oriented X3. Head and Neck Trachea-midline. Thyroid -Note: no thyromegaly. Eye Sclera/Conjunctiva - Bilateral-No scleral icterus. Pupil - Bilateral-Equal. Chest and Lung Exam Chest and lung exam reveals -quiet, even and easy respiratory effort with no use of accessory muscles and on auscultation, normal breath sounds, no adventitious sounds and normal vocal resonance. Breast Nipples-No Discharge. Breast Lump-No Palpable Breast Mass. Cardiovascular Cardiovascular examination reveals -normal heart sounds, regular rate and rhythm with no murmurs, normal pedal pulses bilaterally and no digital clubbing, cyanosis, edema, increased warmth or tenderness. Abdomen Note: soft nt/nd Neurologic Mental Status-Normal. Motor-Normal. Lymphatic Head & Neck General Head & Neck Lymphatics: Bilateral - Description - Normal. Axillary General Axillary Region: Bilateral - Description - Normal. Note: no [place] adenopathy   Assessment & Plan  BREAST CANCER OF UPPER-OUTER QUADRANT OF LEFT FEMALE BREAST (C50.412) Story: Left breast seed guided lumpectomy, left axillary sn biopsy, We discussed the staging and pathophysiology of breast cancer. We discussed all of the different options for treatment for breast cancer including surgery, chemotherapy, radiation therapy, Herceptin, and antiestrogen therapy. We discussed a sentinel lymph node biopsy as she does not appear to having lymph node involvement right now. We discussed the performance of that with injection of radioactive tracer. We discussed that there is a chance of having a positive node with a sentinel  lymph node biopsy and we will await the permanent pathology to make any other first further decisions in terms of her treatment. We discussed up to a 5-10% risk lifetime of chronic shoulder pain as well as lymphedema associated with a sentinel lymph node biopsy. We discussed the options for treatment of the breast cancer which included lumpectomy versus a mastectomy. We discussed the performance of the lumpectomy with radioactive seed placement. We discussed a 5-10% chance of a positive margin requiring reexcision in the operating room. We also discussed that she will likely need radiation therapy if she undergoes lumpectomy. We discussed the mastectomy (removal of whole breast) and the postoperative care for that as well. Mastectomy can be followed by reconstruction. The decision for lumpectomy vs mastectomy has no impact on decision for chemotherapy. Most mastectomy patients will not need radiation therapy. We discussed that there is no difference in her survival whether she undergoes lumpectomy with radiation therapy or antiestrogen therapy versus a mastectomy. There is also no real difference between her recurrence in the breast. We discussed the risks of operation including bleeding, infection, possible reoperation. She understands her further therapy will be based on final path. will get clearance from Dr Willey Blade prior to surgery and have her see med and rad onc

## 2017-07-10 NOTE — Progress Notes (Signed)
Patient given 100 mcg fentanyl and 1 mg versed per order of Dr. Ermalene Postin.

## 2017-07-11 ENCOUNTER — Encounter (HOSPITAL_COMMUNITY): Payer: Self-pay | Admitting: *Deleted

## 2017-07-11 ENCOUNTER — Encounter (HOSPITAL_COMMUNITY): Payer: Self-pay

## 2017-07-11 ENCOUNTER — Observation Stay (HOSPITAL_COMMUNITY): Payer: Medicare Other

## 2017-07-11 DIAGNOSIS — C50911 Malignant neoplasm of unspecified site of right female breast: Secondary | ICD-10-CM

## 2017-07-11 DIAGNOSIS — Z90710 Acquired absence of both cervix and uterus: Secondary | ICD-10-CM | POA: Diagnosis not present

## 2017-07-11 DIAGNOSIS — Z882 Allergy status to sulfonamides status: Secondary | ICD-10-CM | POA: Diagnosis not present

## 2017-07-11 DIAGNOSIS — D45 Polycythemia vera: Secondary | ICD-10-CM | POA: Diagnosis not present

## 2017-07-11 DIAGNOSIS — J9601 Acute respiratory failure with hypoxia: Secondary | ICD-10-CM

## 2017-07-11 DIAGNOSIS — Z6841 Body Mass Index (BMI) 40.0 and over, adult: Secondary | ICD-10-CM | POA: Diagnosis not present

## 2017-07-11 DIAGNOSIS — J9589 Other postprocedural complications and disorders of respiratory system, not elsewhere classified: Secondary | ICD-10-CM

## 2017-07-11 DIAGNOSIS — D7589 Other specified diseases of blood and blood-forming organs: Secondary | ICD-10-CM | POA: Diagnosis not present

## 2017-07-11 DIAGNOSIS — I110 Hypertensive heart disease with heart failure: Secondary | ICD-10-CM | POA: Diagnosis not present

## 2017-07-11 DIAGNOSIS — Z888 Allergy status to other drugs, medicaments and biological substances status: Secondary | ICD-10-CM | POA: Diagnosis not present

## 2017-07-11 DIAGNOSIS — R0902 Hypoxemia: Secondary | ICD-10-CM | POA: Diagnosis not present

## 2017-07-11 DIAGNOSIS — R0602 Shortness of breath: Secondary | ICD-10-CM | POA: Diagnosis not present

## 2017-07-11 DIAGNOSIS — Z7982 Long term (current) use of aspirin: Secondary | ICD-10-CM | POA: Diagnosis not present

## 2017-07-11 DIAGNOSIS — D649 Anemia, unspecified: Secondary | ICD-10-CM | POA: Diagnosis not present

## 2017-07-11 DIAGNOSIS — Z79899 Other long term (current) drug therapy: Secondary | ICD-10-CM | POA: Diagnosis not present

## 2017-07-11 DIAGNOSIS — R0609 Other forms of dyspnea: Secondary | ICD-10-CM | POA: Diagnosis not present

## 2017-07-11 DIAGNOSIS — K219 Gastro-esophageal reflux disease without esophagitis: Secondary | ICD-10-CM | POA: Diagnosis present

## 2017-07-11 DIAGNOSIS — R739 Hyperglycemia, unspecified: Secondary | ICD-10-CM | POA: Diagnosis not present

## 2017-07-11 DIAGNOSIS — Z96652 Presence of left artificial knee joint: Secondary | ICD-10-CM | POA: Diagnosis present

## 2017-07-11 DIAGNOSIS — R05 Cough: Secondary | ICD-10-CM | POA: Diagnosis not present

## 2017-07-11 DIAGNOSIS — J449 Chronic obstructive pulmonary disease, unspecified: Secondary | ICD-10-CM | POA: Diagnosis present

## 2017-07-11 DIAGNOSIS — C50412 Malignant neoplasm of upper-outer quadrant of left female breast: Secondary | ICD-10-CM | POA: Diagnosis not present

## 2017-07-11 DIAGNOSIS — I10 Essential (primary) hypertension: Secondary | ICD-10-CM | POA: Diagnosis not present

## 2017-07-11 DIAGNOSIS — R011 Cardiac murmur, unspecified: Secondary | ICD-10-CM | POA: Diagnosis not present

## 2017-07-11 DIAGNOSIS — K449 Diaphragmatic hernia without obstruction or gangrene: Secondary | ICD-10-CM | POA: Diagnosis present

## 2017-07-11 DIAGNOSIS — K559 Vascular disorder of intestine, unspecified: Secondary | ICD-10-CM | POA: Diagnosis not present

## 2017-07-11 DIAGNOSIS — D471 Chronic myeloproliferative disease: Secondary | ICD-10-CM | POA: Diagnosis not present

## 2017-07-11 DIAGNOSIS — Z8542 Personal history of malignant neoplasm of other parts of uterus: Secondary | ICD-10-CM | POA: Diagnosis not present

## 2017-07-11 DIAGNOSIS — I5033 Acute on chronic diastolic (congestive) heart failure: Secondary | ICD-10-CM | POA: Diagnosis not present

## 2017-07-11 DIAGNOSIS — M109 Gout, unspecified: Secondary | ICD-10-CM | POA: Diagnosis present

## 2017-07-11 DIAGNOSIS — E669 Obesity, unspecified: Secondary | ICD-10-CM | POA: Diagnosis not present

## 2017-07-11 LAB — BASIC METABOLIC PANEL
Anion gap: 6 (ref 5–15)
BUN: 10 mg/dL (ref 6–20)
CHLORIDE: 105 mmol/L (ref 101–111)
CO2: 30 mmol/L (ref 22–32)
Calcium: 8.5 mg/dL — ABNORMAL LOW (ref 8.9–10.3)
Creatinine, Ser: 0.65 mg/dL (ref 0.44–1.00)
GFR calc Af Amer: >60 mL/min (ref >60)
GLUCOSE: 167 mg/dL — AB (ref 65–99)
POTASSIUM: 4.1 mmol/L (ref 3.5–5.1)
Sodium: 141 mmol/L (ref 135–145)

## 2017-07-11 LAB — CBC
HEMATOCRIT: 37.7 % (ref 36.0–46.0)
Hemoglobin: 12.2 g/dL (ref 12.0–15.0)
MCH: 37.1 pg — AB (ref 26.0–34.0)
MCHC: 32.4 g/dL (ref 30.0–36.0)
MCV: 114.6 fL — AB (ref 78.0–100.0)
Platelets: 187 10*3/uL (ref 150–400)
RBC: 3.29 MIL/uL — ABNORMAL LOW (ref 3.87–5.11)
RDW: 12.8 % (ref 11.5–15.5)
WBC: 8.7 10*3/uL (ref 4.0–10.5)

## 2017-07-11 LAB — FOLATE: Folate: 15.1 ng/mL (ref >5.9)

## 2017-07-11 LAB — VITAMIN B12: VITAMIN B 12: 230 pg/mL (ref 180–914)

## 2017-07-11 LAB — PROCALCITONIN: Procalcitonin: <0.1 ng/mL

## 2017-07-11 LAB — MAGNESIUM: MAGNESIUM: 1.9 mg/dL (ref 1.7–2.4)

## 2017-07-11 LAB — TROPONIN I

## 2017-07-11 LAB — TSH: TSH: 0.423 u[IU]/mL (ref 0.350–4.500)

## 2017-07-11 LAB — BRAIN NATRIURETIC PEPTIDE: B Natriuretic Peptide: 197.2 pg/mL — ABNORMAL HIGH (ref 0.0–100.0)

## 2017-07-11 MED ORDER — ENOXAPARIN SODIUM 40 MG/0.4ML ~~LOC~~ SOLN
40.0000 mg | SUBCUTANEOUS | Status: DC
  Administered 2017-07-11 – 2017-07-12 (×2): 40 mg via SUBCUTANEOUS
  Filled 2017-07-11 (×2): qty 0.4

## 2017-07-11 MED ORDER — FUROSEMIDE 10 MG/ML IJ SOLN
20.0000 mg | Freq: Two times a day (BID) | INTRAMUSCULAR | Status: DC
  Administered 2017-07-11 – 2017-07-12 (×2): 20 mg via INTRAVENOUS
  Filled 2017-07-11 (×2): qty 2

## 2017-07-11 MED ORDER — OXYCODONE HCL 5 MG PO TABS: 5.0000 mg | ORAL_TABLET | ORAL | 0 refills | Status: DC | PRN

## 2017-07-11 MED ORDER — FUROSEMIDE 10 MG/ML IJ SOLN
40.0000 mg | Freq: Once | INTRAMUSCULAR | Status: AC
  Administered 2017-07-11: 40 mg via INTRAVENOUS
  Filled 2017-07-11: qty 4

## 2017-07-11 NOTE — Consult Note (Addendum)
Medical Consultation   [name]  TIW:[id number]  DOB: [birth year]  DOA: 07/10/2017  PCP: [name], MD   Outpatient Specialists:  Pulmonology, general surgery   Requesting physician: Dr. Donne Hazel of general surgery  Reason for consultation: Hypoxemia   History of Present Illness: [name] is an 76 y.o. female polycythemia vera, obesity, myeloproliferative disorder with [name] 2 positive mutation, ischemic colitis, hypertension, hiatal hernia, gout, GERD, anemia, uterine cancer. Patient presented to [facility] for elective lumpectomy. Performed on 07/10/2017 by Gen. surgical team. On 07/11/2017 patient was noted to be hypoxic feeling short of breath. She is placed on oxygen with improvement. Hospitalist services consultation for assistance. Currently patient denies any chest pain, palpitations, significant lower extremity edema, fevers, productive cough. Patient states that over the last several weeks she has become much more winded after traveling even a short distance. Patient has not been ambulatory since surgery. SCDs in place since surgery. The problem of hypoxemia is constant and not better or worse since onset.  Review of Systems:  ROS As per HPI otherwise all other systems reviewed and are negative   Past Medical History: Past Medical History:  Diagnosis Date  . Anemia 04/02/2015  . Arthritis   . Atypical glandular cells on vaginal Papanicolaou smear 04/12/2016   Well get colpo and biopsy with JVF  . Bronchitis, chronic obstructive ([place]) since 2000's  . Cancer [facility])    dx with endometrial cancer   . Dysrhythmia   . GERD (gastroesophageal reflux disease)   . Gout   . Heart murmur   . Hiatal hernia   . Hiatal hernia 11/19/2012  . History of endometrial cancer 04/06/2016   Sp hysterectomy 2012  . Hypertension   . Ischemic colitis ([place])    03/2015  . Myeloproliferative disorder, JAK-2 positive 01/05/2014  . Obesity   . Obesity, morbid  (more than 100 lbs over ideal weight or BMI > 40) (HCC) 08/2011   Ht 5'7", wt 280 lb  . Pneumonia 09/2014  . Polycythemia vera(238.4) 01/18/2014  . PONV (postoperative nausea and vomiting)    post-op hypoxic resp failure 09/2011 and require Bi-Pap (possible r/t - pressure pulm edema vs asp PNA, COPD exacerbation, obesity hypoventilation  . Shortness of breath    unable to lay flat due to dyspnea  . Vaginal Pap smear, abnormal     Past Surgical History: Past Surgical History:  Procedure Laterality Date  . ABDOMINAL HYSTERECTOMY  09/12/2011   Procedure: HYSTERECTOMY ABDOMINAL;  Surgeon: Janie Morning, MD;  Location: [facility];  Service: Gynecology;  Laterality: N/A;  Total Abdominal Hysterectomy, Bilateral Salpingo Oophorectomy  . BREAST LUMPECTOMY WITH RADIOACTIVE SEED AND SENTINEL LYMPH NODE BIOPSY Left 07/10/2017   Procedure: LEFT BREAST LUMPECTOMY WITH RADIOACTIVE SEED AND SENTINEL LYMPH NODE BIOPSY;  Surgeon: Rolm Bookbinder, MD;  Location: [place];  Service: General;  Laterality: Left;  . COLONOSCOPY  07/05/2012   Procedure: COLONOSCOPY;  Surgeon: Rogene Houston, MD;  [facility]: [facility];  Service: Endoscopy;  Laterality: N/A;  730  . EYE SURGERY     bilateral 8 - 12 yrs ago  . FLEXIBLE SIGMOIDOSCOPY N/A 04/03/2015   Procedure: FLEXIBLE SIGMOIDOSCOPY;  Surgeon: Rogene Houston, MD;  [facility]: [facility];  Service: Endoscopy;  Laterality: N/A;  . HYSTEROSCOPY W/D&C  08/15/2011   Procedure: DILATATION AND CURETTAGE (D&C) /HYSTEROSCOPY;  Surgeon: Jonnie Kind, MD;  Location: [facility];  Service: Gynecology;  Laterality: N/A;  With Suction Curette  . JOINT REPLACEMENT     left knee replaced 2014  . KNEE ARTHROPLASTY Left 03/15/2015   Procedure: COMPUTER ASSISTED TOTAL KNEE ARTHROPLASTY;  Surgeon: Marybelle Killings, MD;  Location: [place];  Service: Orthopedics;  Laterality: Left;  . SALPINGOOPHORECTOMY  09/12/2011   Procedure: SALPINGO OOPHERECTOMY;  Surgeon: Janie Morning, MD;  Location: [facility];  Service: Gynecology;  Laterality: Bilateral;     Allergies:   Allergies  Allergen Reactions  . Carbamazepine Hives and Other (See Comments)    headache  . Sulfa Antibiotics     Rash     Social History:  reports that she has never smoked. She has never used smokeless tobacco. She reports that she does not drink alcohol or use drugs.   Family History: Family History  Problem Relation Age of Onset  . Other Mother   . Lung cancer Father   . Hypertension Son   . Anesthesia problems Neg Hx   . Hypotension Neg Hx   . Malignant hyperthermia Neg Hx   . Pseudochol deficiency Neg Hx      Physical Exam: Vitals:   07/10/17 2152 07/11/17 0250 07/11/17 0537 07/11/17 1022  BP: (!) 117/45 (!) 127/43 (!) 120/45 (!) 130/48  Pulse: 67 77 86 68  Resp: 17 18 17 17   Temp: 98 F (36.7 C) 98.4 F (36.9 C) 98 F (36.7 C) 98.6 F (37 C)  TempSrc: Oral Oral Oral Oral  SpO2: 90% 95% 93% 98%  Weight: 129.7 kg (286 lb)     Height: 5\' 4"  (1.626 m)       General:  Appears calm and comfortable Eyes:  PERRL, EOMI, normal lids, iris ENT:  grossly normal hearing, lips & tongue, mmm Neck:  no LAD, masses or thyromegaly Cardiovascular: 3/6 systolic murmur, trace bilateral lower extremity pitting edema, regular rate and rhythm Respiratory: Increased effort. Diminished breath sounds in bases with intermittent crackles. Requiring 6 L nor is to maintain O2 saturations in the mid 90s. 4 L nasal cannula with O2 saturations of 90%, room air with O2 saturations dropping into the low 80s and occasionally high in 70s. Abdomen:  soft, ntnd, NABS Skin:  no rash or induration seen on limited exam, surgical dressings clean dry and intact Musculoskeletal:  grossly normal tone BUE/BLE, good ROM, no bony abnormality Psychiatric:  grossly normal mood and affect, speech fluent and appropriate, AOx3 Neurologic:  CN 2-12 grossly intact, moves all extremities in coordinated fashion, sensation intact  Data  reviewed:  I have personally reviewed following labs and imaging studies Labs:  CBC:  Recent Labs Lab 07/11/17 0415  WBC 8.7  HGB 12.2  HCT 37.7  MCV 114.6*  PLT 297    Basic Metabolic Panel:  Recent Labs Lab 07/11/17 0415  NA 141  K 4.1  CL 105  CO2 30  GLUCOSE 167*  BUN 10  CREATININE 0.65  CALCIUM 8.5*   GFR Estimated Creatinine Clearance: 78.7 mL/min (by C-G formula based on SCr of 0.65 mg/dL). Liver Function Tests: No results for input(s): AST, ALT, ALKPHOS, BILITOT, PROT, ALBUMIN in the last 168 hours. No results for input(s): LIPASE, AMYLASE in the last 168 hours. No results for input(s): AMMONIA in the last 168 hours. Coagulation profile No results for input(s): INR, PROTIME in the last 168 hours.  Cardiac Enzymes: No results for input(s): CKTOTAL, CKMB, CKMBINDEX, TROPONINI in the last 168 hours. BNP: Invalid input(s): POCBNP CBG: No results for  input(s): GLUCAP in the last 168 hours. D-Dimer No results for input(s): DDIMER in the last 72 hours. Hgb A1c No results for input(s): HGBA1C in the last 72 hours. Lipid Profile No results for input(s): CHOL, HDL, LDLCALC, TRIG, CHOLHDL, LDLDIRECT in the last 72 hours. Thyroid function studies No results for input(s): TSH, T4TOTAL, T3FREE, THYROIDAB in the last 72 hours.  Invalid input(s): FREET3 Anemia work up No results for input(s): VITAMINB12, FOLATE, FERRITIN, TIBC, IRON, RETICCTPCT in the last 72 hours. Urinalysis    Component Value Date/Time   COLORURINE AMBER (A) 03/12/2015 1003   APPEARANCEUR CLEAR 03/12/2015 1003   LABSPEC 1.019 03/12/2015 1003   PHURINE 7.5 03/12/2015 1003   GLUCOSEU NEGATIVE 03/12/2015 1003   HGBUR NEGATIVE 03/12/2015 1003   BILIRUBINUR SMALL (A) 03/12/2015 1003   KETONESUR 15 (A) 03/12/2015 1003   PROTEINUR NEGATIVE 03/12/2015 1003   UROBILINOGEN 1.0 03/12/2015 1003   NITRITE NEGATIVE 03/12/2015 1003   LEUKOCYTESUR TRACE (A) 03/12/2015 1003     Microbiology No  results found for this or any previous visit (from the past 240 hour(s)).     Inpatient Medications:   Scheduled Meds: . acetaminophen  1,000 mg Oral Q6H  . atenolol  25 mg Oral BID  . cloNIDine  0.15 mg Oral BID  . diclofenac  75 mg Oral QPM  . diltiazem  180 mg Oral Daily  . enoxaparin (LOVENOX) injection  40 mg Subcutaneous Q24H  . furosemide  20 mg Intravenous BID  . furosemide  40 mg Intravenous Once  . hydroxyurea  1,000 mg Oral Daily  . losartan  100 mg Oral QPM  . pantoprazole  40 mg Oral Daily   Continuous Infusions:   Radiological Exams on Admission: Dg Chest 2 View  Result Date: 07/11/2017 CLINICAL DATA:  Hypoxemia, cough, and shortness of breath. EXAM: CHEST  2 VIEW COMPARISON:  One-view chest x-ray 07/10/2017. FINDINGS: Cardiomegaly is exaggerated by low lung volumes. Increasing pulmonary vascular congestion is present. Chronic elevation of the right hemidiaphragm is noted. Bibasilar airspace disease is evident. Small effusions are noted. IMPRESSION: 1. Cardiomegaly with increasing pulmonary vascular congestion compatible with congestive heart failure. 2. Small effusions and bibasilar airspace disease, likely atelectasis. Electronically Signed   By: San Morelle M.D.   On: 07/11/2017 10:27   Mm Breast Surgical Specimen  Result Date: 07/10/2017 CLINICAL DATA:  Status post excision following radioactive seed localization left breast. EXAM: SPECIMEN RADIOGRAPH OF THE LEFT BREAST COMPARISON:  Previous exam(s). FINDINGS: Status post excision of the left breast. The radioactive seed and biopsy marker clip are present, completely intact, and were marked for pathology. IMPRESSION: Specimen radiograph of the left breast. Electronically Signed   By: Nolon Nations M.D.   On: 07/10/2017 17:04   Dg Chest Port 1 View  Result Date: 07/10/2017 CLINICAL DATA:  Respiratory complication, postoperative. EXAM: PORTABLE CHEST 1 VIEW COMPARISON:  Chest x-ray of Mar 12, 2015 FINDINGS:  The lungs remain hypoinflated. There is linear increased density in the left mid to lower lung and left perihilar region. The cardiac silhouette is enlarged. The pulmonary vascularity is not engorged. There is calcification in the wall of the aortic arch. The bony thorax exhibits no acute abnormality. IMPRESSION: Bilateral hypoinflation limits the study. There is likely subsegmental atelectasis in the left perihilar and infrahilar region. There is cardiomegaly without pulmonary vascular congestion. Thoracic aortic atherosclerosis. Electronically Signed   By: Ousmane Seeman  Martinique M.D.   On: 07/10/2017 16:40    Impression/Recommendations Active Problems:  Acute respiratory failure with hypoxia (HCC)   Hyperglycemia   Essential hypertension   Breast cancer (HCC) left   Macrocytosis  Acute hypoxic respiratory failure: Likely secondary to decompensated acute on chronic diastolic CHF in the setting of moderate to advanced COPD without exacerbation. Doubt infectious process, aspiration pneumonitis. O2 saturation drops into the high 70s 1 on room air. Requiring 6 L nasal cannula in order to maintain O2 saturations in the normal range. Patient likely to need home O2 as it sounds like she becomes winded very easily at baseline. Chest x-ray ordered with results as above - Lasix 40mg  X1 then 20mg  BID - O2 PRN, wean as able - ambulatory O2 for home O2 approval - procalcitonin, BNP, TSH, Trop - consider repeat CXR in am if not improving - Echo, I/O, Daily wts.  - ARB and bblocker onboard for optimization   Macrocytosis: 114.6. Hgb low end of normal - Folate and B12 levels w/ next blood draw.  Hypertension: Resistant but well-controlled at this time - Continue current regimen - May need to make adjustments patient comes hypotension based on aggressive diuresis  Hyperglycemia: likely reactive. No h/o DM.  - A1c  Breast cancer: Right breast lumpectomy performed on 07/10/2017. Postoperatively patient appears  to be doing well. Dressings are clean dry and intact. - Further management per primary team  Discussed case w/ Dr. Donne Hazel to update on findings as well as nursing staff.    Thank you for this consultation.  Our [facility] hospitalist team will follow the patient with you. If pts CHF/respiratory status continue to deteriorate or necessitate further hospitalization and is stable for DC from a surgical standpoint [facility] will gladly assume care of the pt as the primary team.    Anntonette Madewell J M.D. [facility] 07/11/2017, 10:58 AM

## 2017-07-11 NOTE — Care Management Note (Addendum)
Case Management Note  Patient Details  Name: [name] MRN: [id number] Date of Birth: 02/14/1941  Subjective/Objective:                    Action/Plan: Consult for home health RN and home oxygen . Will need orders and face to face for home health RN .  Also orders for home oxygen and oxygen saturation ambulation note.  Paged Dr Marily Memos, Spoke with Dr Marily Memos, patient will not need home health RN.  Will ask bedside nurse for oxygen saturation within 24 hours of discharge.  Thanks Magdalen Spatz RN BSN [phone]  Expected Discharge Date:  07/11/17               Expected Discharge Plan:  [place]  In-House Referral:     Discharge planning Services  CM Consult  Post Acute Care Choice:  Home Health, Durable Medical Equipment Choice offered to:     DME Arranged:  Oxygen DME Agency:     HH Arranged:    HH Agency:     Status of Service:  In process, will continue to follow  If discussed at Long Length of Stay Meetings, dates discussed:    Additional Comments:  Marilu Favre, RN 07/11/2017, 11:00 AM

## 2017-07-11 NOTE — Progress Notes (Signed)
SATURATION QUALIFICATIONS: (This note is used to comply with regulatory documentation for home oxygen)  Patient Saturations on Room Air at Rest = 90%  Patient Saturations on Room Air while Ambulating = 77%  Patient Saturations on 6 Liters of oxygen while Ambulating = 93%  Please briefly explain why patient needs home oxygen: Decreased O2 saturation and shortness of breath

## 2017-07-11 NOTE — Progress Notes (Signed)
1 Day Post-Op   Subjective/Chief Complaint: Baseline shortness of breath, pain controlled, sitting up    Objective: Vital signs in last 24 hours: Temp:  [97.5 F (36.4 C)-98.4 F (36.9 C)] 98 F (36.7 C) (09/05 0537) Pulse Rate:  [59-86] 86 (09/05 0537) Resp:  [14-23] 17 (09/05 0537) BP: (102-194)/(43-81) 120/45 (09/05 0537) SpO2:  [90 %-97 %] 93 % (09/05 0537) Weight:  [129.7 kg (286 lb)] 129.7 kg (286 lb) (09/04 2152) Last BM Date: 07/09/17  Intake/Output from previous day: 09/04 0701 - 09/05 0700 In: [place] [P.O.:215; I.V.:1620] Out: 300 [Blood:300] Intake/Output this shift: No intake/output data recorded.  pulm clear bilaterally, no wheezes, decreased bilateral bases cv rrr Left breast with no hematoma  Lab Results:   Recent Labs  07/11/17 0415  WBC 8.7  HGB 12.2  HCT 37.7  PLT 187   BMET  Recent Labs  07/11/17 0415  NA 141  K 4.1  CL 105  CO2 30  GLUCOSE 167*  BUN 10  CREATININE 0.65  CALCIUM 8.5*   PT/INR No results for input(s): LABPROT, INR in the last 72 hours. ABG No results for input(s): PHART, HCO3 in the last 72 hours.  Invalid input(s): PCO2, PO2  Studies/Results: Mm Breast Surgical Specimen  Result Date: 07/10/2017 CLINICAL DATA:  Status post excision following radioactive seed localization left breast. EXAM: SPECIMEN RADIOGRAPH OF THE LEFT BREAST COMPARISON:  Previous exam(s). FINDINGS: Status post excision of the left breast. The radioactive seed and biopsy marker clip are present, completely intact, and were marked for pathology. IMPRESSION: Specimen radiograph of the left breast. Electronically Signed   By: Nolon Nations M.D.   On: 07/10/2017 17:04   Dg Chest Port 1 View  Result Date: 07/10/2017 CLINICAL DATA:  Respiratory complication, postoperative. EXAM: PORTABLE CHEST 1 VIEW COMPARISON:  Chest x-ray of Mar 12, 2015 FINDINGS: The lungs remain hypoinflated. There is linear increased density in the left mid to lower lung and left  perihilar region. The cardiac silhouette is enlarged. The pulmonary vascularity is not engorged. There is calcification in the wall of the aortic arch. The bony thorax exhibits no acute abnormality. IMPRESSION: Bilateral hypoinflation limits the study. There is likely subsegmental atelectasis in the left perihilar and infrahilar region. There is cardiomegaly without pulmonary vascular congestion. Thoracic aortic atherosclerosis. Electronically Signed   By: David  Martinique M.D.   On: 07/10/2017 16:40    Anti-infectives: Anti-infectives    Start     Dose/Rate Route Frequency Ordered Stop   07/10/17 0600  ceFAZolin (ANCEF) 3 g in dextrose 5 % 50 mL IVPB     3 g 130 mL/hr over 30 Minutes Intravenous On call to O.R. 07/08/17 1457 07/10/17 1405      Assessment/Plan: POD 1 lump/sn  Doing well except o2 requirment Will have medicine see this am, I think this is likely exacerbation of her baseline  [facility] 07/11/2017

## 2017-07-12 ENCOUNTER — Inpatient Hospital Stay (HOSPITAL_COMMUNITY): Payer: Medicare Other

## 2017-07-12 DIAGNOSIS — R0902 Hypoxemia: Secondary | ICD-10-CM

## 2017-07-12 LAB — ECHOCARDIOGRAM COMPLETE
Area-P 1/2: 2.86 cm2
CHL CUP PV REG GRAD DIAS: 8 mmHg
CHL CUP REG VEL DIAS: 142 cm/s
CHL CUP RV SYS PRESS: 59 mmHg
EERAT: 14.26
EWDT: 264 ms
FS: 46 % — AB (ref 28–44)
Height: 64 in
IVS/LV PW RATIO, ED: 1.39
LA ID, A-P, ES: 55 mm
LA diam end sys: 55 mm
LA vol A4C: 66.7 ml
LA vol index: 31.4 mL/m2
LA vol: 79.6 mL
LADIAMINDEX: 2.17 cm/m2
LV E/e' medial: 14.26
LV E/e'average: 14.26
LV TDI E'LATERAL: 10.1
LV e' LATERAL: 10.1 cm/s
LVOT area: 3.46 cm2
LVOT diameter: 21 mm
Lateral S' vel: 10.4 cm/s
MV Dec: 264
MV pk A vel: 138 m/s
MV pk E vel: 144 m/s
MVPG: 8 mmHg
MVSPHT: 77 ms
PW: 13.8 mm — AB (ref 0.6–1.1)
Reg peak vel: 330 cm/s
TAPSE: 22.4 mm
TDI e' medial: 4.7
TRMAXVEL: 330 cm/s
WEIGHTICAEL: 4697.6 [oz_av]

## 2017-07-12 LAB — BASIC METABOLIC PANEL
ANION GAP: 6 (ref 5–15)
BUN: 25 mg/dL — ABNORMAL HIGH (ref 6–20)
CO2: 30 mmol/L (ref 22–32)
Calcium: 8.8 mg/dL — ABNORMAL LOW (ref 8.9–10.3)
Chloride: 104 mmol/L (ref 101–111)
Creatinine, Ser: 0.91 mg/dL (ref 0.44–1.00)
GFR calc Af Amer: >60 mL/min (ref >60)
GFR, EST NON AFRICAN AMERICAN: 59 mL/min — AB (ref >60)
GLUCOSE: 122 mg/dL — AB (ref 65–99)
POTASSIUM: 4.2 mmol/L (ref 3.5–5.1)
Sodium: 140 mmol/L (ref 135–145)

## 2017-07-12 MED ORDER — FUROSEMIDE 20 MG PO TABS
20.0000 mg | ORAL_TABLET | Freq: Every day | ORAL | 0 refills | Status: DC
Start: 2017-07-12 — End: 2017-11-12

## 2017-07-12 MED ORDER — FUROSEMIDE 20 MG PO TABS
20.0000 mg | ORAL_TABLET | Freq: Every day | ORAL | Status: DC
  Administered 2017-07-12: 20 mg via ORAL
  Filled 2017-07-12: qty 1

## 2017-07-12 NOTE — Progress Notes (Signed)
  Echocardiogram 2D Echocardiogram has been performed.  [name] [name] 07/12/2017, 11:38 AM

## 2017-07-12 NOTE — Progress Notes (Signed)
Pt given discharge instructions and prescriptions. She was given home O2 to take with her.

## 2017-07-12 NOTE — Progress Notes (Signed)
2 Days Post-Op   Subjective/Chief Complaint: Feels better today, still on four liters oxygen   Objective: Vital signs in last 24 hours: Temp:  [98.2 F (36.8 C)-98.6 F (37 C)] 98.2 F (36.8 C) (09/06 0520) Pulse Rate:  [56-68] 66 (09/06 0520) Resp:  [16-18] 17 (09/06 0520) BP: (130-132)/(48-68) 130/68 (09/06 0520) SpO2:  [77 %-98 %] 96 % (09/06 0520) Weight:  [133.2 kg (293 lb 9.6 oz)] 133.2 kg (293 lb 9.6 oz) (09/06 0500) Last BM Date: 07/09/17  Intake/Output from previous day: 09/05 0701 - 09/06 0700 In: 222 [P.O.:222] Out: 1825 [Urine:1825] Intake/Output this shift: No intake/output data recorded.  Incision clean without infection  Lab Results:   Recent Labs  07/11/17 0415  WBC 8.7  HGB 12.2  HCT 37.7  PLT 187   BMET  Recent Labs  07/11/17 0415 07/12/17 0530  NA 141 140  K 4.1 4.2  CL 105 104  CO2 30 30  GLUCOSE 167* 122*  BUN 10 25*  CREATININE 0.65 0.91  CALCIUM 8.5* 8.8*   PT/INR No results for input(s): LABPROT, INR in the last 72 hours. ABG No results for input(s): PHART, HCO3 in the last 72 hours.  Invalid input(s): PCO2, PO2  Studies/Results: Dg Chest 2 View  Result Date: 07/11/2017 CLINICAL DATA:  Hypoxemia, cough, and shortness of breath. EXAM: CHEST  2 VIEW COMPARISON:  One-view chest x-ray 07/10/2017. FINDINGS: Cardiomegaly is exaggerated by low lung volumes. Increasing pulmonary vascular congestion is present. Chronic elevation of the right hemidiaphragm is noted. Bibasilar airspace disease is evident. Small effusions are noted. IMPRESSION: 1. Cardiomegaly with increasing pulmonary vascular congestion compatible with congestive heart failure. 2. Small effusions and bibasilar airspace disease, likely atelectasis. Electronically Signed   By: San Morelle M.D.   On: 07/11/2017 10:27   Mm Breast Surgical Specimen  Result Date: 07/10/2017 CLINICAL DATA:  Status post excision following radioactive seed localization left breast. EXAM:  SPECIMEN RADIOGRAPH OF THE LEFT BREAST COMPARISON:  Previous exam(s). FINDINGS: Status post excision of the left breast. The radioactive seed and biopsy marker clip are present, completely intact, and were marked for pathology. IMPRESSION: Specimen radiograph of the left breast. Electronically Signed   By: Nolon Nations M.D.   On: 07/10/2017 17:04   Dg Chest Port 1 View  Result Date: 07/10/2017 CLINICAL DATA:  Respiratory complication, postoperative. EXAM: PORTABLE CHEST 1 VIEW COMPARISON:  Chest x-ray of Mar 12, 2015 FINDINGS: The lungs remain hypoinflated. There is linear increased density in the left mid to lower lung and left perihilar region. The cardiac silhouette is enlarged. The pulmonary vascularity is not engorged. There is calcification in the wall of the aortic arch. The bony thorax exhibits no acute abnormality. IMPRESSION: Bilateral hypoinflation limits the study. There is likely subsegmental atelectasis in the left perihilar and infrahilar region. There is cardiomegaly without pulmonary vascular congestion. Thoracic aortic atherosclerosis. Electronically Signed   By: David  Martinique M.D.   On: 07/10/2017 16:40    Anti-infectives: Anti-infectives    Start     Dose/Rate Route Frequency Ordered Stop   07/10/17 0600  ceFAZolin (ANCEF) 3 g in dextrose 5 % 50 mL IVPB     3 g 130 mL/hr over 30 Minutes Intravenous On call to O.R. 07/08/17 1457 07/10/17 1405      Assessment/Plan: POD 1 lump/sn   Doing fine with respect to breast Path pending dispo pending oxygen  Appreciate Dr Marily Memos assistance   [facility] 07/12/2017

## 2017-07-12 NOTE — Progress Notes (Signed)
SATURATION QUALIFICATIONS: (This note is used to comply with regulatory documentation for home oxygen)  Patient Saturations on Room Air at Rest = *94**%  Patient Saturations on Room Air while Ambulating = **88-90*%  Patient Saturations on **2* Liters of oxygen while Ambulating = **95*%  Please briefly explain why patient needs home oxygen:

## 2017-07-12 NOTE — Discharge Summary (Signed)
Physician Discharge Summary  Patient ID: LADREA [name] MRN: [id number] DOB/AGE: [birth year] 76 y.o.  Admit date: 07/10/2017 Discharge date: 07/12/2017  Admission Diagnoses: Breast cancerr P vera COPD   Discharge Diagnoses:  Active Problems:   Acute respiratory failure with hypoxia (HCC)   Hyperglycemia   Essential hypertension   Breast cancer (HCC) left   Macrocytosis   Respiratory complication, postoperative   Discharged Condition: good  Hospital Course: 75 yof with left breast cancer underwent seed guided lumpectomy and sentinel node biopsy. Pathology pending. Postoperatively pain has been well controlled.  She has had issues with low oxygen saturations.  She has undergone chest xray and echocardiogram with a medicine consult.  Her oxygen saturations have improved with pulmonary toilet and some diuresis.  She will be discharged home on lasix with follow up with Dr Willey Blade.  Final echo results are pending.    Consults: internal medicine  Significant Diagnostic Studies: echocardiogram, chest xray  Treatments: left breast seed guided lumpectomy, left axillary sentinel node biopsy    Disposition: 01-Home or Self Care  Discharge Instructions    Discharge instructions    Complete by:  As directed    If your weight increases more than 2-3 lbs in 1 day or greater than 5 lbs in 1 week, please take extra dose of the lasix and let Dr. Willey Blade know.  Please follow up with Dr. Willey Blade within the week for your heart failure.     Allergies as of 07/12/2017      Reactions   Carbamazepine Hives, Other (See Comments)   headache   Sulfa Antibiotics    Rash      Medication List    TAKE these medications   albuterol 108 (90 Base) MCG/ACT inhaler Commonly known as:  PROVENTIL HFA;VENTOLIN HFA Inhale 2 puffs into the lungs every 6 (six) hours as needed for wheezing or shortness of breath. Reported on 02/07/2016   aspirin EC 81 MG tablet Take 81 mg by mouth at bedtime.   atenolol 50 MG  tablet Commonly known as:  TENORMIN Take 25 mg by mouth 2 (two) times daily.   cloNIDine 0.3 MG tablet Commonly known as:  CATAPRES Take 0.15 mg by mouth 2 (two) times daily.   diclofenac 75 MG EC tablet Commonly known as:  VOLTAREN Take 1 tablet (75 mg total) by mouth 2 (two) times daily. What changed:  when to take this   diltiazem 180 MG 24 hr capsule Commonly known as:  CARDIZEM CD Take 180 mg by mouth daily.   diphenhydrAMINE 25 MG tablet Commonly known as:  SOMINEX Take 25 mg by mouth at bedtime.   docusate sodium 100 MG capsule Commonly known as:  COLACE Take 100 mg by mouth daily as needed for mild constipation.   esomeprazole 40 MG capsule Commonly known as:  NEXIUM Take 1 capsule (40 mg total) by mouth every morning.   ferrous sulfate 325 (65 FE) MG EC tablet Take 325 mg by mouth daily.   furosemide 20 MG tablet Commonly known as:  LASIX Take 1 tablet (20 mg total) by mouth daily.   GAS RELIEF PO Take 1 tablet by mouth daily.   hydroxyurea 500 MG capsule Commonly known as:  HYDREA TAKE TWO CAPSULES BY MOUTH ONCE DAILY   losartan 100 MG tablet Commonly known as:  COZAAR Take 100 mg by mouth every evening.   oxyCODONE 5 MG immediate release tablet Commonly known as:  Oxy IR/ROXICODONE Take 1 tablet (5 mg total) by mouth  every 4 (four) hours as needed for moderate pain.   simvastatin 10 MG tablet Commonly known as:  ZOCOR Take 10 mg by mouth every evening.            Durable Medical Equipment        Start     Ordered   07/11/17 1440  For home use only DME oxygen  Once    Question Answer Comment  Mode or (Route) Nasal cannula   Liters per Minute 2   Frequency Continuous (stationary and portable oxygen unit needed)   Oxygen conserving device Yes   Oxygen delivery system Gas      07/11/17 1439       Discharge Care Instructions        Start     Ordered   07/12/17 0000  furosemide (LASIX) 20 MG tablet  Daily     07/12/17 1650    07/12/17 0000  Discharge instructions    Comments:  If your weight increases more than 2-3 lbs in 1 day or greater than 5 lbs in 1 week, please take extra dose of the lasix and let Dr. Willey Blade know.  Please follow up with Dr. Willey Blade within the week for your heart failure.   07/12/17 1650   07/11/17 0000  oxyCODONE (OXY IR/ROXICODONE) 5 MG immediate release tablet  Every 4 hours PRN     07/11/17 0744     Follow-up Information    Rolm Bookbinder, MD Follow up in 3 week(s).   Specialty:  General Surgery Contact information: [address]  [place] [address] [phone]        [name], MD Follow up in 1 week(s).   Specialty:  Internal Medicine Contact information: [address] [place] [address] [phone]           SignedRolm Bookbinder 07/12/2017, 5:54 PM

## 2017-07-12 NOTE — Consult Note (Signed)
Consult NOTE    [name]  CXK:[id number] DOB: 02/14/1941 DOA: 07/10/2017 PCP: [name], MD    Brief Narrative: (Start on day 1 of progress note - keep it brief and live) [name] is an 76 y.o. 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 Patient presented to [facility] for elective lumpectomy. Performed on 07/10/2017 by Gen. surgical team. On 07/11/2017 patient was noted to be hypoxic feeling short of breath. She is placed on oxygen with improvement. Hospitalist services consultation for assistance. Currently patient denies any chest pain, palpitations, significant lower extremity edema, fevers, productive cough. Patient states that over the last several weeks she has become much more winded after traveling even a short distance. Patient has not been ambulatory since surgery. SCDs in place since surgery. The problem of hypoxemia is constant and not better or worse since onset.  She's improved after the IV lasix.  Currently likely ready for discharge pending results of echo.  Will need to go home on lasix 20 mg daily (instructions to double dose if weight up 2-3 lbs in a day or 5 lbs in a week).  Continue beta blocker and arb.  Follow up with Dr. Willey Blade within the next few days.  Depending on echo results, may need cardiology follow up.  No supplemental O2 given her walk test.  Consider MMA with Dr. Willey Blade for macrocytosis.    Assessment & Plan:   Active Problems:   Acute respiratory failure with hypoxia (HCC)   Hyperglycemia   Essential hypertension   Breast cancer (HCC) left   Macrocytosis   Respiratory complication, postoperative   Acute hypoxic respiratory failure: Likely secondary to decompensated CHF in the setting of moderate to advanced COPD without exacerbation which is supported by her elevated BNP.  Was requiring up to 6 L, but  now on room air satting at goal 88-92%.  Walk test performed with saturations 88-90% on room air, which is at goal given her history of COPD.  She notes she feels at her baseline and is ready for discharge.  She improved with lasix 40x1 yesterday IV and 20 IV today.  Will plan to discharge with 20 mg PO daily as she's lasix naive.  She should take her weight daily and double the dose if her weight increases 2-3 lbs in a day or more than 5 lbs over a week.  Will need close follow up with her PCP for her heart failure.  She likely does not need home O2 given her sats, but if she notices worsening DOE, would repeat 6 minute walk test with PCP as outpatient.    [ ]  discharge currently pending echo results [ ]  lasix 20 mg PO daily, she's out 1.8 L since admission - no home O2 necessary given improvement in sx and walk test as noted above - procalcitonin, TSH, Trop unremarkable - ARB and bblocker onboard for optimization   Macrocytosis: 114.6. Hgb low end of normal - normal folate, low normal B12 [ ]  consider MMA as outpatient  Hypertension: Resistant but well-controlled at this time - Continue current regimen - May need to make adjustments patient comes hypotension based on aggressive diuresis  Hyperglycemia: likely reactive. No h/o DM.  - A1c  Breast cancer: Right breast lumpectomy performed on 07/10/2017. Postoperatively patient appears to be doing well. Dressings are clean dry and intact. - Further management per primary team  Discussed case w/  Dr. Donne Hazel to update on findings as well as nursing staff.   DVT prophylaxis: lovenox Code Status: full Family Communication: husband Disposition Plan: pending echo.   Primary:   surgery  Procedures: (Don't include imaging studies which can be auto populated. Include things that cannot be auto populated i.e. Echo, Carotid and venous dopplers, Foley, Bipap, HD, tubes/drains, wound vac, central lines etc)  S/p lumpectomy as  above  Antimicrobials: (specify start and planned stop date. Auto populated tables are space occupying and do not give end dates)  none    Subjective: Doing well.  Feels at her baseline.  No SOB.  Denies SOB with exertion.  She denies progressive SOB over the past few weeks.  Notes that she doesn't typically stop to catch her breath.  She's eager to go home.  Doesn't know that she needs the O2, she's always had low normal O2 sats in the office.   Objective: Vitals:   07/12/17 0200 07/12/17 0500 07/12/17 0520 07/12/17 1028  BP:   130/68 (!) 134/50  Pulse:   66   Resp:   17   Temp:   98.2 F (36.8 C)   TempSrc:   Oral   SpO2: 96%  96% 95%  Weight:  133.2 kg (293 lb 9.6 oz)    Height:        Intake/Output Summary (Last 24 hours) at 07/12/17 1630 Last data filed at 07/12/17 1426  Gross per 24 hour  Intake              600 ml  Output              925 ml  Net             -325 ml   Filed Weights   07/10/17 1108 07/10/17 2152 07/12/17 0500  Weight: 129.7 kg (286 lb) 129.7 kg (286 lb) 133.2 kg (293 lb 9.6 oz)    Examination:  General exam: Appears calm and comfortable  Respiratory system: Clear to auscultation. Respiratory effort normal. Cardiovascular system: S1 & S2 heard, RRR. No JVD, murmurs, rubs, gallops or clicks.  Gastrointestinal system: Abdomen is nondistended, soft and nontender. No organomegaly or masses felt. Normal bowel sounds heard. Central nervous system: Alert and oriented. No focal neurological deficits. Extremities: 1+ edema bilaterally Skin: No rashes, lesions or ulcers Psychiatry: Judgement and insight appear normal. Mood & affect appropriate.     Data Reviewed: I have personally reviewed following labs and imaging studies  CBC:  Recent Labs Lab 07/11/17 0415  WBC 8.7  HGB 12.2  HCT 37.7  MCV 114.6*  PLT 440   Basic Metabolic Panel:  Recent Labs Lab 07/11/17 0415 07/11/17 1102 07/12/17 0530  NA 141  --  140  K 4.1  --  4.2  CL 105   --  104  CO2 30  --  30  GLUCOSE 167*  --  122*  BUN 10  --  25*  CREATININE 0.65  --  0.91  CALCIUM 8.5*  --  8.8*  MG  --  1.9  --    GFR: Estimated Creatinine Clearance: 70.4 mL/min (by C-G formula based on SCr of 0.91 mg/dL). Liver Function Tests: No results for input(s): AST, ALT, ALKPHOS, BILITOT, PROT, ALBUMIN in the last 168 hours. No results for input(s): LIPASE, AMYLASE in the last 168 hours. No results for input(s): AMMONIA in the last 168 hours. Coagulation Profile: No results for input(s): INR, PROTIME in the last 168 hours. Cardiac Enzymes:  Recent Labs Lab 07/11/17 1102  TROPONINI <0.03   BNP (last 3 results) No results for input(s): PROBNP in the last 8760 hours. HbA1C: No results for input(s): HGBA1C in the last 72 hours. CBG: No results for input(s): GLUCAP in the last 168 hours. Lipid Profile: No results for input(s): CHOL, HDL, LDLCALC, TRIG, CHOLHDL, LDLDIRECT in the last 72 hours. Thyroid Function Tests:  Recent Labs  07/11/17 1102  TSH 0.423   Anemia Panel:  Recent Labs  07/11/17 1102  VITAMINB12 230  FOLATE 15.1   Sepsis Labs:  Recent Labs Lab 07/11/17 1102  PROCALCITON <0.10    No results found for this or any previous visit (from the past 240 hour(s)).       Radiology Studies: Dg Chest 2 View  Result Date: 07/11/2017 CLINICAL DATA:  Hypoxemia, cough, and shortness of breath. EXAM: CHEST  2 VIEW COMPARISON:  One-view chest x-ray 07/10/2017. FINDINGS: Cardiomegaly is exaggerated by low lung volumes. Increasing pulmonary vascular congestion is present. Chronic elevation of the right hemidiaphragm is noted. Bibasilar airspace disease is evident. Small effusions are noted. IMPRESSION: 1. Cardiomegaly with increasing pulmonary vascular congestion compatible with congestive heart failure. 2. Small effusions and bibasilar airspace disease, likely atelectasis. Electronically Signed   By: San Morelle M.D.   On: 07/11/2017 10:27    Dg Chest Port 1 View  Result Date: 07/10/2017 CLINICAL DATA:  Respiratory complication, postoperative. EXAM: PORTABLE CHEST 1 VIEW COMPARISON:  Chest x-ray of Mar 12, 2015 FINDINGS: The lungs remain hypoinflated. There is linear increased density in the left mid to lower lung and left perihilar region. The cardiac silhouette is enlarged. The pulmonary vascularity is not engorged. There is calcification in the wall of the aortic arch. The bony thorax exhibits no acute abnormality. IMPRESSION: Bilateral hypoinflation limits the study. There is likely subsegmental atelectasis in the left perihilar and infrahilar region. There is cardiomegaly without pulmonary vascular congestion. Thoracic aortic atherosclerosis. Electronically Signed   By: David  Martinique M.D.   On: 07/10/2017 16:40        Scheduled Meds: . acetaminophen  1,000 mg Oral Q6H  . atenolol  25 mg Oral BID  . cloNIDine  0.15 mg Oral BID  . diclofenac  75 mg Oral QPM  . diltiazem  180 mg Oral Daily  . enoxaparin (LOVENOX) injection  40 mg Subcutaneous Q24H  . furosemide  20 mg Intravenous BID  . hydroxyurea  1,000 mg Oral Daily  . losartan  100 mg Oral QPM  . pantoprazole  40 mg Oral Daily   Continuous Infusions:   LOS: 1 day    Time spent: 35 minutes    A Melven Sartorius, MD [facility] [phone]   If 7PM-7AM, please contact night-coverage [email or web address] Password [email or web address] 07/12/2017, 4:30 PM

## 2017-07-13 DIAGNOSIS — J449 Chronic obstructive pulmonary disease, unspecified: Secondary | ICD-10-CM | POA: Diagnosis not present

## 2017-07-13 NOTE — Consult Note (Signed)
           [facility] CM Primary Care Navigator  07/13/2017  [name] 10-24-1941 [id number]   Went to seepatient earlier at the bedsideto identify possible discharge needs but she was already discharged per staff report.   Patient was discharged home yesterday.  Noted that patient has discharge instruction to follow-up with primary care provider in a week.   For questions, please contact:  Dannielle Huh, BSN, RN- [facility] Primary Care Navigator  Telephone: [phone] [place]

## 2017-07-16 DIAGNOSIS — I5031 Acute diastolic (congestive) heart failure: Secondary | ICD-10-CM | POA: Diagnosis not present

## 2017-07-16 DIAGNOSIS — D473 Essential (hemorrhagic) thrombocythemia: Secondary | ICD-10-CM | POA: Diagnosis not present

## 2017-07-16 DIAGNOSIS — C50912 Malignant neoplasm of unspecified site of left female breast: Secondary | ICD-10-CM | POA: Diagnosis not present

## 2017-07-19 ENCOUNTER — Encounter (HOSPITAL_COMMUNITY): Payer: Self-pay | Admitting: Adult Health

## 2017-07-19 NOTE — Progress Notes (Signed)
Per Si Raider, [place], patient contacted our office and has requested to transfer all of her care to the cancer center in [place].  Therefore, all of her subsequent appointments here at [facility] have been cancelled. I will make Dr. Donne Hazel and the breast nurse navigators at [facility] aware so they can help coordinate patient's oncology care in [place].   Mike Craze, NP [place] [phone]

## 2017-07-20 ENCOUNTER — Telehealth: Payer: Self-pay | Admitting: Hematology and Oncology

## 2017-07-20 NOTE — Telephone Encounter (Signed)
Appt has been scheduled for the pt to see Dr. Lindi Adie on 9/18 at 1pm. Pt agreed to the appt date and time. She's a transfer from [facility]. Aware to arrive 30 minutes early.

## 2017-07-24 ENCOUNTER — Ambulatory Visit (HOSPITAL_COMMUNITY): Payer: Self-pay

## 2017-07-24 ENCOUNTER — Encounter: Payer: Self-pay | Admitting: Hematology and Oncology

## 2017-07-24 ENCOUNTER — Ambulatory Visit (HOSPITAL_BASED_OUTPATIENT_CLINIC_OR_DEPARTMENT_OTHER): Payer: Medicare Other | Admitting: Hematology and Oncology

## 2017-07-24 DIAGNOSIS — Z170 Estrogen receptor positive status [ER+]: Secondary | ICD-10-CM

## 2017-07-24 DIAGNOSIS — C50412 Malignant neoplasm of upper-outer quadrant of left female breast: Secondary | ICD-10-CM

## 2017-07-24 NOTE — Assessment & Plan Note (Signed)
07/10/2017 Left lumpectomy: IDC grade 2, 1.3 cm, intermediate grade DCIS, margins negative, 0/2 lymph nodes negative, ER 5% positive weak staining, PR 0%, HER-2 negative ratio 1.73, Ki-67 15%, T1cN0 stage IA AJCC 8  Pathology counseling: I discussed the final pathology report of the patient provided  a copy of this report. I discussed the margins as well as lymph node surgeries. We also discussed the final staging along with previously performed ER/PR and HER-2/neu testing.  Recommendations: 1. Oncotype DX testing to determine if chemotherapy would be of any benefit followed by 2. Adjuvant radiation therapy followed by 3. Adjuvant antiestrogen therapy  Oncotype counseling: I discussed Oncotype DX test. I explained to the patient that this is a 21 gene panel to evaluate patient tumors DNA to calculate recurrence score. This would help determine whether patient has high risk or intermediate risk or low risk breast cancer. She understands that if her tumor was found to be high risk, she would benefit from systemic chemotherapy. If low risk, no need of chemotherapy. If she was found to be intermediate risk, we would need to evaluate the score as well as other risk factors and determine if an abbreviated chemotherapy may be of benefit.  Return to clinic based upon Oncotype DX testing

## 2017-07-24 NOTE — Progress Notes (Signed)
[place] NOTE  Patient Care Team: [name], MD as PCP - General (Internal Medicine) Jonnie Kind, MD as Consulting Physician (Obstetrics and Gynecology) Janie Morning, MD as Attending Physician (Obstetrics and Gynecology) Carole Civil, MD as Consulting Physician (Orthopedic Surgery)  CHIEF COMPLAINTS/PURPOSE OF CONSULTATION:  Newly diagnosed breast cancer  HISTORY OF PRESENTING ILLNESS:  [name] 75 y.o. female is here because of recent diagnosis of left breast cancer. Patient had a routine screening mammogram that detected abnormality in the left breast which was biopsy-proven to be invasive ductal carcinoma. She was taken for lumpectomy and sentinel lymph node study by Dr. Donne Hazel on 07/10/2017. Final pathology revealed invasive ductal carcinoma that is ER weakly positive PR negative HER-2 negative Ki-67 of 15%. She was then sent to [place] for discussion regarding treatment options because one of the 2 sentinel lymph nodes skin back positive for breast cancer. She has recovered very well from recent surgeries and is accompanied today by her husband and her daughter. Patient has a previous history of uterine cancer and had undergone hysterectomy. Patient had stage II disease.  I reviewed her records extensively and collaborated the history with the patient.  SUMMARY OF ONCOLOGIC HISTORY:   Malignant neoplasm of upper-outer quadrant of left breast in female, estrogen receptor positive ([place])   07/10/2017 Surgery    Left lumpectomy: IDC grade 2, 1.3 cm, intermediate grade DCIS, margins negative, 1/2 lymph nodes positive, ER 5% positive weak staining, PR 0%, HER-2 negative ratio 1.73, Ki-67 15%, T1cN0 stage IA AJCC 8       MEDICAL HISTORY:  Past Medical History:  Diagnosis Date  . Anemia 04/02/2015  . Arthritis   . Atypical glandular cells on vaginal Papanicolaou smear 04/12/2016   Well get colpo and biopsy with JVF  . Bronchitis, chronic  obstructive ([place]) since 2000's  . Cancer [facility])    dx with endometrial cancer   . Dysrhythmia   . GERD (gastroesophageal reflux disease)   . Gout   . Heart murmur   . Hiatal hernia   . Hiatal hernia 11/19/2012  . History of endometrial cancer 04/06/2016   Sp hysterectomy 2012  . Hypertension   . Ischemic colitis ([place])    03/2015  . Myeloproliferative disorder, JAK-2 positive 01/05/2014  . Obesity   . Obesity, morbid (more than 100 lbs over ideal weight or BMI > 40) (HCC) 08/2011   Ht 5'7", wt 280 lb  . Pneumonia 09/2014  . Polycythemia vera(238.4) 01/18/2014  . PONV (postoperative nausea and vomiting)    post-op hypoxic resp failure 09/2011 and require Bi-Pap (possible r/t - pressure pulm edema vs asp PNA, COPD exacerbation, obesity hypoventilation  . Shortness of breath    unable to lay flat due to dyspnea  . Vaginal Pap smear, abnormal     SURGICAL HISTORY: Past Surgical History:  Procedure Laterality Date  . ABDOMINAL HYSTERECTOMY  09/12/2011   Procedure: HYSTERECTOMY ABDOMINAL;  Surgeon: Janie Morning, MD;  [facility];  Service: Gynecology;  Laterality: N/A;  Total Abdominal Hysterectomy, Bilateral Salpingo Oophorectomy  . BREAST LUMPECTOMY Left 07/10/2017   LEFT BREAST LUMPECTOMY WITH RADIOACTIVE SEED AND SENTINEL LYMPH NODE BIOPSY   . BREAST LUMPECTOMY WITH RADIOACTIVE SEED AND SENTINEL LYMPH NODE BIOPSY Left 07/10/2017   Procedure: LEFT BREAST LUMPECTOMY WITH RADIOACTIVE SEED AND SENTINEL LYMPH NODE BIOPSY;  Surgeon: Rolm Bookbinder, MD;  Location: [place];  Service: General;  Laterality: Left;  . COLONOSCOPY  07/05/2012   Procedure: COLONOSCOPY;  Surgeon: Rogene Houston, MD;  [facility];  Service: Endoscopy;  Laterality: N/A;  730  . EYE SURGERY     bilateral 8 - 12 yrs ago  . FLEXIBLE SIGMOIDOSCOPY N/A 04/03/2015   Procedure: FLEXIBLE SIGMOIDOSCOPY;  Surgeon: Rogene Houston, MD;  [facility];  Service: Endoscopy;  Laterality: N/A;  .  HYSTEROSCOPY W/D&C  08/15/2011   Procedure: DILATATION AND CURETTAGE (D&C) /HYSTEROSCOPY;  Surgeon: Jonnie Kind, MD;  Location[facility];  Service: Gynecology;  Laterality: N/A;  With Suction Curette  . JOINT REPLACEMENT     left knee replaced 2014  . KNEE ARTHROPLASTY Left 03/15/2015   Procedure: COMPUTER ASSISTED TOTAL KNEE ARTHROPLASTY;  Surgeon: Marybelle Killings, MD;  Location: [place];  Service: Orthopedics;  Laterality: Left;  . SALPINGOOPHORECTOMY  09/12/2011   Procedure: SALPINGO OOPHERECTOMY;  Surgeon: Janie Morning, MD;  [facility];  Service: Gynecology;  Laterality: Bilateral;    SOCIAL HISTORY: Social History   Social History  . Marital status: Married    Spouse name: N/A  . Number of children: N/A  . Years of education: N/A   Occupational History  . Not on file.   Social History Main Topics  . Smoking status: Never Smoker  . Smokeless tobacco: Never Used  . Alcohol use No  . Drug use: No  . Sexual activity: No     Comment: hysterectomy   Other Topics Concern  . Not on file   Social History Narrative  . No narrative on file    FAMILY HISTORY: Family History  Problem Relation Age of Onset  . Other Mother   . Lung cancer Father   . Hypertension Son   . Anesthesia problems Neg Hx   . Hypotension Neg Hx   . Malignant hyperthermia Neg Hx   . Pseudochol deficiency Neg Hx     ALLERGIES:  is allergic to carbamazepine and sulfa antibiotics.  MEDICATIONS:  Current Outpatient Prescriptions  Medication Sig Dispense Refill  . albuterol (PROVENTIL HFA;VENTOLIN HFA) 108 (90 Base) MCG/ACT inhaler Inhale 2 puffs into the lungs every 6 (six) hours as needed for wheezing or shortness of breath. Reported on 02/07/2016 1 Inhaler 0  . aspirin EC 81 MG tablet Take 81 mg by mouth at bedtime.     Marland Kitchen atenolol (TENORMIN) 50 MG tablet Take 25 mg by mouth 2 (two) times daily.     . cloNIDine (CATAPRES) 0.3 MG tablet Take 0.15 mg by mouth 2 (two) times daily.     . diclofenac  (VOLTAREN) 75 MG EC tablet Take 1 tablet (75 mg total) by mouth 2 (two) times daily. (Patient taking differently: Take 75 mg by mouth every evening. ) 60 tablet 3  . diltiazem (CARDIZEM CD) 180 MG 24 hr capsule Take 180 mg by mouth daily.     . diphenhydrAMINE (SOMINEX) 25 MG tablet Take 25 mg by mouth at bedtime.    . docusate sodium (COLACE) 100 MG capsule Take 100 mg by mouth daily as needed for mild constipation.     Marland Kitchen esomeprazole (NEXIUM) 40 MG capsule Take 1 capsule (40 mg total) by mouth every morning. 30 capsule 11  . ferrous sulfate 325 (65 FE) MG EC tablet Take 325 mg by mouth daily.    . furosemide (LASIX) 20 MG tablet Take 1 tablet (20 mg total) by mouth daily. 30 tablet 0  . hydroxyurea (HYDREA) 500 MG capsule TAKE TWO CAPSULES BY MOUTH ONCE DAILY 180 capsule 1  .  losartan (COZAAR) 100 MG tablet Take 100 mg by mouth every evening.     Marland Kitchen oxyCODONE (OXY IR/ROXICODONE) 5 MG immediate release tablet Take 1 tablet (5 mg total) by mouth every 4 (four) hours as needed for moderate pain. 10 tablet 0  . Simethicone (GAS RELIEF PO) Take 1 tablet by mouth daily.     . simvastatin (ZOCOR) 10 MG tablet Take 10 mg by mouth every evening.      No current facility-administered medications for this visit.     REVIEW OF SYSTEMS:   Constitutional: Denies fevers, chills or abnormal night sweats Eyes: Denies blurriness of vision, double vision or watery eyes Ears, nose, mouth, throat, and face: Denies mucositis or sore throat Respiratory: Denies cough, dyspnea or wheezes Cardiovascular: Denies palpitation, chest discomfort or lower extremity swelling Gastrointestinal:  Denies nausea, heartburn or change in bowel habits Skin: Denies abnormal skin rashes Lymphatics: Denies new lymphadenopathy or easy bruising Neurological:Denies numbness, tingling or new weaknesses Behavioral/Psych: Mood is stable, no new changes  Breast:  Recent left lumpectomy All other systems were reviewed with the patient and  are negative.  PHYSICAL EXAMINATION: ECOG PERFORMANCE STATUS: 1 - Symptomatic but completely ambulatory  Vitals:   07/24/17 1259  BP: (!) 153/50  Pulse: 60  Resp: 18  Temp: 97.7 F (36.5 C)  SpO2: 91%   Filed Weights   07/24/17 1259  Weight: 286 lb (129.7 kg)    GENERAL:alert, no distress and comfortable SKIN: skin color, texture, turgor are normal, no rashes or significant lesions EYES: normal, conjunctiva are pink and non-injected, sclera clear OROPHARYNX:no exudate, no erythema and lips, buccal mucosa, and tongue normal  NECK: supple, thyroid normal size, non-tender, without nodularity LYMPH:  no palpable lymphadenopathy in the cervical, axillary or inguinal LUNGS: clear to auscultation and percussion with normal breathing effort HEART: regular rate & rhythm and no murmurs and no lower extremity edema ABDOMEN:abdomen soft, non-tender and normal bowel sounds Musculoskeletal:no cyanosis of digits and no clubbing  PSYCH: alert & oriented x 3 with fluent speech NEURO: no focal motor/sensory deficits  LABORATORY DATA:  I have reviewed the data as listed Lab Results  Component Value Date   WBC 8.7 07/11/2017   HGB 12.2 07/11/2017   HCT 37.7 07/11/2017   MCV 114.6 (H) 07/11/2017   PLT 187 07/11/2017   Lab Results  Component Value Date   NA 140 07/12/2017   K 4.2 07/12/2017   CL 104 07/12/2017   CO2 30 07/12/2017    RADIOGRAPHIC STUDIES: I have personally reviewed the radiological reports and agreed with the findings in the report.  ASSESSMENT AND PLAN:  Malignant neoplasm of upper-outer quadrant of left breast in female, estrogen receptor positive ([place]) 07/10/2017 Left lumpectomy: IDC grade 2, 1.3 cm, intermediate grade DCIS, margins negative, 0/2 lymph nodes negative, ER 5% positive weak staining, PR 0%, HER-2 negative ratio 1.73, Ki-67 15%, T1cN0 stage IA AJCC 8  Pathology counseling: I discussed the final pathology report of the patient provided  a copy of this  report. I discussed the margins as well as lymph node surgeries. We also discussed the final staging along with previously performed ER/PR and HER-2/neu testing.  Recommendations: 1.Mammaprint testing to determine if chemotherapy would be of any benefit followed by 2. Adjuvant radiation therapy followed by 3. Adjuvant antiestrogen therapy  Mammaprint counseling: MINDACT is a prospective, randomized phase III controlled trial that investigates the clinical utility of MammaPrint, when compared to standard clinical pathological criteria, with 6,693 patients enrolled from  over 111 institutions. Clinical high-risk patients with a Low Risk MammaPrint result, including 48% node-positive, had 5-year distant metastasis-free survival rate in excess of 94 percent, whether randomized to receive adjuvant chemotherapy or not proving MammaPrint's ability to safely identify Low Risk patients.  Return to clinic based upon Mammaprint testing. We will call her with the result of this test. If she is low risk then she can proceed with radiation. If she is high risk and we will consider treatment with Taxotere and Cytoxan every 3 weeks 4 cycles.   All questions were answered. The patient knows to call the clinic with any problems, questions or concerns.    Rulon Eisenmenger, MD 07/24/17

## 2017-07-25 ENCOUNTER — Telehealth: Payer: Self-pay | Admitting: *Deleted

## 2017-07-25 ENCOUNTER — Telehealth: Payer: Self-pay | Admitting: Hematology and Oncology

## 2017-07-25 NOTE — Telephone Encounter (Signed)
Per 9/18 no los °

## 2017-07-25 NOTE — Telephone Encounter (Signed)
Ordered mammaprint per Dr. Lindi Adie.  Faxed requisition to pathology and [facility]

## 2017-07-27 ENCOUNTER — Encounter: Payer: Self-pay | Admitting: Cardiovascular Disease

## 2017-07-27 ENCOUNTER — Ambulatory Visit (INDEPENDENT_AMBULATORY_CARE_PROVIDER_SITE_OTHER): Payer: Medicare Other | Admitting: Cardiovascular Disease

## 2017-07-27 VITALS — BP 112/52 | HR 64 | Ht 64.0 in | Wt 271.0 lb

## 2017-07-27 DIAGNOSIS — Z9289 Personal history of other medical treatment: Secondary | ICD-10-CM | POA: Diagnosis not present

## 2017-07-27 DIAGNOSIS — E785 Hyperlipidemia, unspecified: Secondary | ICD-10-CM | POA: Diagnosis not present

## 2017-07-27 DIAGNOSIS — I110 Hypertensive heart disease with heart failure: Secondary | ICD-10-CM

## 2017-07-27 DIAGNOSIS — I5032 Chronic diastolic (congestive) heart failure: Secondary | ICD-10-CM

## 2017-07-27 DIAGNOSIS — I10 Essential (primary) hypertension: Secondary | ICD-10-CM | POA: Diagnosis not present

## 2017-07-27 NOTE — Patient Instructions (Signed)
Medication Instructions:  Your physician recommends that you continue on your current medications as directed. Please refer to the Current Medication list given to you today.  Labwork: none  Testing/Procedures: none  Follow-Up: Your physician wants you to follow-up in: [place] DR. Bronson Ing You will receive a reminder letter in the mail two months in advance. If you don't receive a letter, please call our office to schedule the follow-up appointment.  Any Other Special Instructions Will Be Listed Below (If Applicable).  If you need a refill on your cardiac medications before your next appointment, please call your pharmacy.

## 2017-07-27 NOTE — Progress Notes (Signed)
CARDIOLOGY CONSULT NOTE  Patient ID: [name] MRN: [id number] DOB/AGE: 03/03/1941 76 y.o.  Admit date: (Not on file) Primary Physician: [name], MD Referring Physician: Dr. Willey Stafford  Reason for Consultation: diastolic heart failure  HPI: [name] is a 76 y.o. female who is being seen today for the evaluation of diastolic heart failure at the request of [name], MD.   She has a history of polycythemia vera, myeloproliferative disorder with JAC2 positive mutation, ischemic colitis, hypertension, gout, GERD, and uterine cancer.  I personally reviewed electronic medical records, labs, and studies.  She went to [facility] for elective lumpectomy on 07/10/17 and on 9/5, she was noted to be hypoxic and short of breath. She was given oxygen. She denied chest pain, palpitations, and leg swelling. She commented that she had been more dyspneic with exertion over the previous few weeks.  She was given IV Lasix and was discharged on Lasix 20 mg daily. She was evaluated by nephrology. She did not require supplemental oxygen.  She initially required 6 L of oxygen and was given 40 mg IV Lasix initially followed by 20 mg IV Lasix the following day.  BNP was mildly elevated at 197.   Troponin was normal. Hemoglobin was normal, 12.2. BUN 10, creatinine 0.65.  Two-view chest x-ray on 07/11/17 showed CHF.  ECG on 07/02/17 which I personally interpreted demonstrated sinus rhythm with right bundle-branch block and LVH.  Echocardiogram 07/12/17: Normal left ventricular systolic function and regional wall motion, LVEF 60-65%, moderate LVH, grade 2 diastolic dysfunction with increased filling pressures, mild aortic root dilatation, mild mitral regurgitation, mild to moderate biatrial dilatation, mild to moderate tricuspid regurgitation, pulmonary pressures 52 mmHg.  She tells me she has had exertional dyspnea for years. This has not gotten any worse. She denies chest tightness  and chest pain.  She has tried 3 different pairs of compression stockings but each of them cuts off her circulation. She tries to elevate her legs at home.  She is morbidly obese. She said she and her husband eat out a lot. She tries to cook with less salt at home.  She said oxygen saturations are typically between 90 and 91%.   Allergies  Allergen Reactions  . Carbamazepine Hives and Other (See Comments)    headache  . Sulfa Antibiotics     Rash    Current Outpatient Prescriptions  Medication Sig Dispense Refill  . albuterol (PROVENTIL HFA;VENTOLIN HFA) 108 (90 Base) MCG/ACT inhaler Inhale 2 puffs into the lungs every 6 (six) hours as needed for wheezing or shortness of breath. Reported on 02/07/2016 1 Inhaler 0  . aspirin EC 81 MG tablet Take 81 mg by mouth at bedtime.     Marland Kitchen atenolol (TENORMIN) 50 MG tablet Take 25 mg by mouth 2 (two) times daily.     . cephALEXin (KEFLEX) 500 MG capsule Take 500 mg by mouth 2 (two) times daily.    . cloNIDine (CATAPRES) 0.3 MG tablet Take 0.15 mg by mouth 2 (two) times daily.     . diclofenac (VOLTAREN) 75 MG EC tablet Take 1 tablet (75 mg total) by mouth 2 (two) times daily. (Patient taking differently: Take 75 mg by mouth every evening. ) 60 tablet 3  . diltiazem (CARDIZEM CD) 180 MG 24 hr capsule Take 180 mg by mouth daily.     . diphenhydrAMINE (SOMINEX) 25 MG tablet Take 25 mg by mouth at bedtime.    . docusate  sodium (COLACE) 100 MG capsule Take 100 mg by mouth daily as needed for mild constipation.     Marland Kitchen esomeprazole (NEXIUM) 40 MG capsule Take 1 capsule (40 mg total) by mouth every morning. 30 capsule 11  . ferrous sulfate 325 (65 FE) MG EC tablet Take 325 mg by mouth daily.    . furosemide (LASIX) 20 MG tablet Take 1 tablet (20 mg total) by mouth daily. 30 tablet 0  . hydroxyurea (HYDREA) 500 MG capsule TAKE TWO CAPSULES BY MOUTH ONCE DAILY 180 capsule 1  . losartan (COZAAR) 100 MG tablet Take 100 mg by mouth every evening.     Marland Kitchen oxyCODONE  (OXY IR/ROXICODONE) 5 MG immediate release tablet Take 1 tablet (5 mg total) by mouth every 4 (four) hours as needed for moderate pain. 10 tablet 0  . Simethicone (GAS RELIEF PO) Take 1 tablet by mouth daily.     . simvastatin (ZOCOR) 10 MG tablet Take 10 mg by mouth every evening.      No current facility-administered medications for this visit.     Past Medical History:  Diagnosis Date  . Anemia 04/02/2015  . Arthritis   . Atypical glandular cells on vaginal Papanicolaou smear 04/12/2016   Well get colpo and biopsy with JVF  . Bronchitis, chronic obstructive ([place]) since 2000's  . Cancer [facility])    dx with endometrial cancer   . Dysrhythmia   . GERD (gastroesophageal reflux disease)   . Gout   . Heart murmur   . Hiatal hernia   . Hiatal hernia 11/19/2012  . History of endometrial cancer 04/06/2016   Sp hysterectomy 2012  . Hypertension   . Ischemic colitis ([place])    03/2015  . Myeloproliferative disorder, JAK-2 positive 01/05/2014  . Obesity   . Obesity, morbid (more than 100 lbs over ideal weight or BMI > 40) (HCC) 08/2011   Ht 5'7", wt 280 lb  . Pneumonia 09/2014  . Polycythemia vera(238.4) 01/18/2014  . PONV (postoperative nausea and vomiting)    post-op hypoxic resp failure 09/2011 and require Bi-Pap (possible r/t - pressure pulm edema vs asp PNA, COPD exacerbation, obesity hypoventilation  . Shortness of breath    unable to lay flat due to dyspnea  . Vaginal Pap smear, abnormal     Past Surgical History:  Procedure Laterality Date  . ABDOMINAL HYSTERECTOMY  09/12/2011   Procedure: HYSTERECTOMY ABDOMINAL;  Surgeon: Janie Morning, MD;  [facility];  Service: Gynecology;  Laterality: N/A;  Total Abdominal Hysterectomy, Bilateral Salpingo Oophorectomy  . BREAST LUMPECTOMY Left 07/10/2017   LEFT BREAST LUMPECTOMY WITH RADIOACTIVE SEED AND SENTINEL LYMPH NODE BIOPSY   . BREAST LUMPECTOMY WITH RADIOACTIVE SEED AND SENTINEL LYMPH NODE BIOPSY Left 07/10/2017   Procedure: LEFT BREAST  LUMPECTOMY WITH RADIOACTIVE SEED AND SENTINEL LYMPH NODE BIOPSY;  Surgeon: Rolm Bookbinder, MD;  Location: [place];  Service: General;  Laterality: Left;  . COLONOSCOPY  07/05/2012   Procedure: COLONOSCOPY;  Surgeon: Rogene Houston, MD;  [facility];  Service: Endoscopy;  Laterality: N/A;  730  . EYE SURGERY     bilateral 8 - 12 yrs ago  . FLEXIBLE SIGMOIDOSCOPY N/A 04/03/2015   Procedure: FLEXIBLE SIGMOIDOSCOPY;  Surgeon: Rogene Houston, MD;  [facility];  Service: Endoscopy;  Laterality: N/A;  . HYSTEROSCOPY W/D&C  08/15/2011   Procedure: DILATATION AND CURETTAGE (D&C) /HYSTEROSCOPY;  Surgeon: Jonnie Kind, MD;  [facility];  Service: Gynecology;  Laterality: N/A;  With  Suction Curette  . JOINT REPLACEMENT     left knee replaced 2014  . KNEE ARTHROPLASTY Left 03/15/2015   Procedure: COMPUTER ASSISTED TOTAL KNEE ARTHROPLASTY;  Surgeon: Marybelle Killings, MD;  Location: [place];  Service: Orthopedics;  Laterality: Left;  . SALPINGOOPHORECTOMY  09/12/2011   Procedure: SALPINGO OOPHERECTOMY;  Surgeon: Janie Morning, MD;  [facility];  Service: Gynecology;  Laterality: Bilateral;    Social History   Social History  . Marital status: Married    Spouse name: N/A  . Number of children: N/A  . Years of education: N/A   Occupational History  . Not on file.   Social History Main Topics  . Smoking status: Never Smoker  . Smokeless tobacco: Never Used  . Alcohol use No  . Drug use: No  . Sexual activity: No     Comment: hysterectomy   Other Topics Concern  . Not on file   Social History Narrative  . No narrative on file     No family history of premature CAD in 1st degree relatives.  Current Meds  Medication Sig  . albuterol (PROVENTIL HFA;VENTOLIN HFA) 108 (90 Base) MCG/ACT inhaler Inhale 2 puffs into the lungs every 6 (six) hours as needed for wheezing or shortness of breath. Reported on 02/07/2016  . aspirin EC 81 MG tablet Take 81 mg by mouth at  bedtime.   Marland Kitchen atenolol (TENORMIN) 50 MG tablet Take 25 mg by mouth 2 (two) times daily.   . cephALEXin (KEFLEX) 500 MG capsule Take 500 mg by mouth 2 (two) times daily.  . cloNIDine (CATAPRES) 0.3 MG tablet Take 0.15 mg by mouth 2 (two) times daily.   . diclofenac (VOLTAREN) 75 MG EC tablet Take 1 tablet (75 mg total) by mouth 2 (two) times daily. (Patient taking differently: Take 75 mg by mouth every evening. )  . diltiazem (CARDIZEM CD) 180 MG 24 hr capsule Take 180 mg by mouth daily.   . diphenhydrAMINE (SOMINEX) 25 MG tablet Take 25 mg by mouth at bedtime.  . docusate sodium (COLACE) 100 MG capsule Take 100 mg by mouth daily as needed for mild constipation.   Marland Kitchen esomeprazole (NEXIUM) 40 MG capsule Take 1 capsule (40 mg total) by mouth every morning.  . ferrous sulfate 325 (65 FE) MG EC tablet Take 325 mg by mouth daily.  . furosemide (LASIX) 20 MG tablet Take 1 tablet (20 mg total) by mouth daily.  . hydroxyurea (HYDREA) 500 MG capsule TAKE TWO CAPSULES BY MOUTH ONCE DAILY  . losartan (COZAAR) 100 MG tablet Take 100 mg by mouth every evening.   Marland Kitchen oxyCODONE (OXY IR/ROXICODONE) 5 MG immediate release tablet Take 1 tablet (5 mg total) by mouth every 4 (four) hours as needed for moderate pain.  . Simethicone (GAS RELIEF PO) Take 1 tablet by mouth daily.   . simvastatin (ZOCOR) 10 MG tablet Take 10 mg by mouth every evening.       Review of systems complete and found to be negative unless listed above in HPI    Physical exam Blood pressure (!) 112/52, pulse 64, height 5\' 4"  (1.626 m), weight 271 lb (122.9 kg), SpO2 90 %. General: NAD Neck: No JVD, no thyromegaly or thyroid nodule.  Lungs: Clear to auscultation bilaterally with normal respiratory effort. CV: Nondisplaced PMI. Regular rate and rhythm, normal S1/S2, no S3/S4, no murmur.  Trace lower extremity and periankle edema bilaterally with findings of chronic venous insufficiency.  No carotid bruit.  Abdomen: Soft, nontender,  protuberant.  Skin: Intact without lesions or rashes.  Neurologic: Alert and oriented x 3.  Psych: Normal affect. Extremities: No clubbing or cyanosis.  HEENT: Normal.   ECG: Most recent ECG reviewed.   Labs: Lab Results  Component Value Date/Time   K 4.2 07/12/2017 05:30 AM   BUN 25 (H) 07/12/2017 05:30 AM   CREATININE 0.91 07/12/2017 05:30 AM   CREATININE 0.76 06/16/2013 07:15 AM   ALT 17 03/06/2017 12:54 PM   TSH 0.423 07/11/2017 11:02 AM   HGB 12.2 07/11/2017 04:15 AM     Lipids: No results found for: LDLCALC, LDLDIRECT, CHOL, TRIG, HDL      ASSESSMENT AND PLAN:  1. Hypertensive heart disease with chronic diastolic heart failure: Blood pressure is controlled. I educated her on the importance of a low sodium diet. She and her husband eat out quite frequently. She appears euvolemic on Lasix 20 mg daily. No changes to therapy.  2. Hypertension: Blood pressure is controlled. No changes to therapy.  3. Hyperlipidemia: Continue statin.  4. Morbid obesity: I encouraged weight loss. She likely has restrictive lung disease due to this.   Disposition: Follow up in 6 months  Signed: Kate Sable, M.D., F.A.C.C.  07/27/2017, 8:40 AM

## 2017-07-31 ENCOUNTER — Ambulatory Visit (HOSPITAL_COMMUNITY): Payer: Self-pay | Admitting: Adult Health

## 2017-07-31 ENCOUNTER — Encounter (HOSPITAL_COMMUNITY): Payer: Self-pay

## 2017-08-03 ENCOUNTER — Telehealth: Payer: Self-pay | Admitting: *Deleted

## 2017-08-03 ENCOUNTER — Encounter: Payer: Self-pay | Admitting: Radiation Oncology

## 2017-08-03 NOTE — Telephone Encounter (Signed)
Received mammaprint results of low risk.  Spoke with patient and she is aware.  Msg sent for a Dr. Lisbeth Renshaw appt.

## 2017-08-06 ENCOUNTER — Ambulatory Visit: Payer: Self-pay | Admitting: Cardiology

## 2017-08-14 ENCOUNTER — Telehealth: Payer: Self-pay | Admitting: *Deleted

## 2017-08-14 NOTE — Telephone Encounter (Signed)
Patient called stating she is being followed by a Dr in [place] for her recent breast surgery. She was prescribed some antibiotics because an area around her incision is red. She developed a yeast infection and was prescribed Diflucan. She started taking it last night and woke up this morning with dizziness and noticed smears of bright red blood when she used the bathroom. Patient is concerned because she has had uterine cancer in the past. Informed patient that Dr Glo Herring was out of the office today but can get patient in tomorrow to be seen. Verbalized understanding.

## 2017-08-15 ENCOUNTER — Ambulatory Visit (INDEPENDENT_AMBULATORY_CARE_PROVIDER_SITE_OTHER): Payer: Medicare Other | Admitting: Obstetrics and Gynecology

## 2017-08-15 ENCOUNTER — Encounter: Payer: Self-pay | Admitting: Obstetrics and Gynecology

## 2017-08-15 VITALS — BP 130/72 | HR 63 | Ht 64.0 in | Wt 288.0 lb

## 2017-08-15 DIAGNOSIS — N905 Atrophy of vulva: Secondary | ICD-10-CM | POA: Diagnosis not present

## 2017-08-15 DIAGNOSIS — N9089 Other specified noninflammatory disorders of vulva and perineum: Secondary | ICD-10-CM

## 2017-08-15 NOTE — Progress Notes (Signed)
[place] [facility] Visit  08/15/2017            Patient name: [name] MRN [id number]  Date of birth: 1941/03/24  CC & HPI:  [name] is a 76 y.o. female presenting today for vaginal bleeding with onset of 3 days ago. Pt has had left breast lumpectomy with radioactive seed and sentinel lymph node biopsy done by Dr. Donne Hazel on 07/12/17. Pt has associated symptoms of a burning sensation. No alleviating factors noted. Her left breast had recent redness, which she saw Dr. Donne Hazel on Monday for. Pt was prescribed antibiotics, which she took. She believed the medication caused her bleeding. Pt says she is to see Dr. Donne Hazel on Monday, and if redness hasn't resolved, he will do an U/S. Pt is not taking abx now. She denies a fever.   Pt has had an abdominal hysterectomy.  ROS:  ROS  +vaginal bleeding +burning sensation -fever All systems are negative except as noted in the HPI and PMH.    Pertinent History Reviewed:   Reviewed: Significant for left invasive breast cancer Medical         Past Medical History:  Diagnosis Date  . Anemia 04/02/2015  . Arthritis   . Atypical glandular cells on vaginal Papanicolaou smear 04/12/2016   Well get colpo and biopsy with JVF  . Bronchitis, chronic obstructive ([place]) since 2000's  . Cancer [facility])    dx with endometrial cancer   . Dysrhythmia   . GERD (gastroesophageal reflux disease)   . Gout   . Heart murmur   . Hiatal hernia   . Hiatal hernia 11/19/2012  . History of endometrial cancer 04/06/2016   Sp hysterectomy 2012  . Hypertension   . Ischemic colitis ([place])    03/2015  . Myeloproliferative disorder, JAK-2 positive 01/05/2014  . Obesity   . Obesity, morbid (more than 100 lbs over ideal weight or BMI > 40) (HCC) 08/2011   Ht 5'7", wt 280 lb  . Pneumonia 09/2014  . Polycythemia vera(238.4) 01/18/2014  . PONV (postoperative nausea and vomiting)    post-op hypoxic resp failure 09/2011 and require Bi-Pap (possible r/t -  pressure pulm edema vs asp PNA, COPD exacerbation, obesity hypoventilation  . Shortness of breath    unable to lay flat due to dyspnea  . Vaginal Pap smear, abnormal                               Surgical Hx:    Past Surgical History:  Procedure Laterality Date  . ABDOMINAL HYSTERECTOMY  09/12/2011   Procedure: HYSTERECTOMY ABDOMINAL;  Surgeon: Janie Morning, MD;  [facility];  Service: Gynecology;  Laterality: N/A;  Total Abdominal Hysterectomy, Bilateral Salpingo Oophorectomy  . BREAST LUMPECTOMY Left 07/10/2017   LEFT BREAST LUMPECTOMY WITH RADIOACTIVE SEED AND SENTINEL LYMPH NODE BIOPSY   . BREAST LUMPECTOMY WITH RADIOACTIVE SEED AND SENTINEL LYMPH NODE BIOPSY Left 07/10/2017   Procedure: LEFT BREAST LUMPECTOMY WITH RADIOACTIVE SEED AND SENTINEL LYMPH NODE BIOPSY;  Surgeon: Rolm Bookbinder, MD;  Location: [place];  Service: General;  Laterality: Left;  . COLONOSCOPY  07/05/2012   Procedure: COLONOSCOPY;  Surgeon: Rogene Houston, MD;  [facility];  Service: Endoscopy;  Laterality: N/A;  730  . EYE SURGERY     bilateral 8 - 12 yrs ago  . FLEXIBLE SIGMOIDOSCOPY N/A 04/03/2015   Procedure: FLEXIBLE SIGMOIDOSCOPY;  Surgeon: Rogene Houston,  MD;  [facility];  Service: Endoscopy;  Laterality: N/A;  . HYSTEROSCOPY W/D&C  08/15/2011   Procedure: DILATATION AND CURETTAGE (D&C) /HYSTEROSCOPY;  Surgeon: Jonnie Kind, MD;  [facility];  Service: Gynecology;  Laterality: N/A;  With Suction Curette  . JOINT REPLACEMENT     left knee replaced 2014  . KNEE ARTHROPLASTY Left 03/15/2015   Procedure: COMPUTER ASSISTED TOTAL KNEE ARTHROPLASTY;  Surgeon: Marybelle Killings, MD;  Location: [place];  Service: Orthopedics;  Laterality: Left;  . SALPINGOOPHORECTOMY  09/12/2011   Procedure: SALPINGO OOPHERECTOMY;  Surgeon: Janie Morning, MD;  [facility];  Service: Gynecology;  Laterality: Bilateral;   Medications: Reviewed & Updated - see associated section                        Current Outpatient Prescriptions:  .  albuterol (PROVENTIL HFA;VENTOLIN HFA) 108 (90 Base) MCG/ACT inhaler, Inhale 2 puffs into the lungs every 6 (six) hours as needed for wheezing or shortness of breath. Reported on 02/07/2016, Disp: 1 Inhaler, Rfl: 0 .  aspirin EC 81 MG tablet, Take 81 mg by mouth at bedtime. , Disp: , Rfl:  .  atenolol (TENORMIN) 50 MG tablet, Take 25 mg by mouth 2 (two) times daily. , Disp: , Rfl:  .  cephALEXin (KEFLEX) 500 MG capsule, Take 500 mg by mouth 2 (two) times daily., Disp: , Rfl:  .  cloNIDine (CATAPRES) 0.3 MG tablet, Take 0.15 mg by mouth 2 (two) times daily. , Disp: , Rfl:  .  diclofenac (VOLTAREN) 75 MG EC tablet, Take 1 tablet (75 mg total) by mouth 2 (two) times daily. (Patient taking differently: Take 75 mg by mouth every evening. ), Disp: 60 tablet, Rfl: 3 .  diltiazem (CARDIZEM CD) 180 MG 24 hr capsule, Take 180 mg by mouth daily. , Disp: , Rfl:  .  diphenhydrAMINE (SOMINEX) 25 MG tablet, Take 25 mg by mouth at bedtime., Disp: , Rfl:  .  docusate sodium (COLACE) 100 MG capsule, Take 100 mg by mouth daily as needed for mild constipation. , Disp: , Rfl:  .  esomeprazole (NEXIUM) 40 MG capsule, Take 1 capsule (40 mg total) by mouth every morning., Disp: 30 capsule, Rfl: 11 .  ferrous sulfate 325 (65 FE) MG EC tablet, Take 325 mg by mouth daily., Disp: , Rfl:  .  hydroxyurea (HYDREA) 500 MG capsule, TAKE TWO CAPSULES BY MOUTH ONCE DAILY, Disp: 180 capsule, Rfl: 1 .  losartan (COZAAR) 100 MG tablet, Take 100 mg by mouth every evening. , Disp: , Rfl:  .  oxyCODONE (OXY IR/ROXICODONE) 5 MG immediate release tablet, Take 1 tablet (5 mg total) by mouth every 4 (four) hours as needed for moderate pain., Disp: 10 tablet, Rfl: 0 .  Simethicone (GAS RELIEF PO), Take 1 tablet by mouth daily. , Disp: , Rfl:  .  simvastatin (ZOCOR) 10 MG tablet, Take 10 mg by mouth every evening. , Disp: , Rfl:  .  furosemide (LASIX) 20 MG tablet, Take 1 tablet (20 mg total) by mouth  daily., Disp: 30 tablet, Rfl: 0   Social History: Reviewed -  reports that she has never smoked. She has never used smokeless tobacco.  Objective Findings:  Vitals: Blood pressure 130/72, pulse 63, height 5\' 4"  (1.626 m), weight 288 lb (130.6 kg).  Physical Examination: General appearance - alert, well appearing, and in no distress Mental status - alert, oriented to person, place, and time Pelvic -  VULVA: brownish discharge VAGINA: tissues atrophic and thinned out, No lesions, masses or sores. Healed well CERVIX: surgically absent UTERUS: surgically absent ADNEXA: not done   Assessment & Plan:   A:  1. Mastitis of left breast, s/p partial mastectomy, currently under treatment by dr. Donne Hazel 2. Atrophic vag tissues, tissue injury secondary to wiping  P:  1 Desitin to soothe tissues will switch to metrogel if pain and bleeding persist. 2. F/u PRN     By signing my name below, I, [name], attest that this documentation has been prepared under the direction and in the presence of Jonnie Kind, MD. Electronically Signed: Jabier Gauss, Medical Scribe. 08/15/17. 2:23 PM.  I personally performed the services described in this documentation, which was SCRIBED in my presence. The recorded information has been reviewed and considered accurate. It has been edited as necessary during review. Jonnie Kind, MD

## 2017-08-15 NOTE — Progress Notes (Addendum)
[facility]  08/15/2017            Patient name: [name] MRN [id number]  Date of birth: 1941-08-30  CC & HPI:  [name] is a 76 y.o. female presenting today for vaginal bleeding with onset of 3 days ago. Pt has had left breast lumpectomy with radioactive seed and sentinel lymph node biopsy done by Dr. Donne Hazel on 07/12/17. Pt has associated symptoms of a burning sensation. No alleviating factors noted. Her left breast had recent redness, which she saw Dr. Donne Hazel on Monday for. Pt was prescribed antibiotics, which she took. She believed the medication caused her bleeding. Pt says she is to see Dr. Donne Hazel on Monday, and if redness hasn't resolved, he will do an U/S. Pt is not taking abx now. She denies a fever.   Pt has had an abdominal hysterectomy.  ROS:  ROS  +vaginal bleeding +burning sensation -fever All systems are negative except as noted in the HPI and PMH.    Pertinent History Reviewed:   Reviewed: Significant for left invasive breast cancer Medical         Past Medical History:  Diagnosis Date   Anemia 04/02/2015   Arthritis    Atypical glandular cells on vaginal Papanicolaou smear 04/12/2016   Well get colpo and biopsy with JVF   Bronchitis, chronic obstructive ([place]) since 2000's   Cancer ([place])    dx with endometrial cancer    Dysrhythmia    GERD (gastroesophageal reflux disease)    Gout    Heart murmur    Hiatal hernia    Hiatal hernia 11/19/2012   History of endometrial cancer 04/06/2016   Sp hysterectomy 2012   Hypertension    Ischemic colitis ([place])    03/2015   Myeloproliferative disorder, JAK-2 positive 01/05/2014   Obesity    Obesity, morbid (more than 100 lbs over ideal weight or BMI > 40) ([place]) 08/2011   Ht 5'7", wt 280 lb   Pneumonia 09/2014   Polycythemia vera(238.4) 01/18/2014   PONV (postoperative nausea and vomiting)    post-op hypoxic resp failure 09/2011 and require Bi-Pap (possible r/t -  pressure pulm edema vs asp PNA, COPD exacerbation, obesity hypoventilation   Shortness of breath    unable to lay flat due to dyspnea   Vaginal Pap smear, abnormal                               Surgical Hx:    Past Surgical History:  Procedure Laterality Date   ABDOMINAL HYSTERECTOMY  09/12/2011   Procedure: HYSTERECTOMY ABDOMINAL;  Surgeon: Janie Morning, MD;  [facility];  Service: Gynecology;  Laterality: N/A;  Total Abdominal Hysterectomy, Bilateral Salpingo Oophorectomy   BREAST LUMPECTOMY Left 07/10/2017   LEFT BREAST LUMPECTOMY WITH RADIOACTIVE SEED AND SENTINEL LYMPH NODE BIOPSY    BREAST LUMPECTOMY WITH RADIOACTIVE SEED AND SENTINEL LYMPH NODE BIOPSY Left 07/10/2017   Procedure: LEFT BREAST LUMPECTOMY WITH RADIOACTIVE SEED AND SENTINEL LYMPH NODE BIOPSY;  Surgeon: Rolm Bookbinder, MD;  Location: [place];  Service: General;  Laterality: Left;   COLONOSCOPY  07/05/2012   Procedure: COLONOSCOPY;  Surgeon: Rogene Houston, MD;  [facility];  Service: Endoscopy;  Laterality: N/A;  730   EYE SURGERY     bilateral 8 - 12 yrs ago   FLEXIBLE SIGMOIDOSCOPY N/A 04/03/2015   Procedure: FLEXIBLE SIGMOIDOSCOPY;  Surgeon: Rogene Houston,  MD;  [facility];  Service: Endoscopy;  Laterality: N/A;   HYSTEROSCOPY W/D&C  08/15/2011   Procedure: DILATATION AND CURETTAGE (D&C) /HYSTEROSCOPY;  Surgeon: Jonnie Kind, MD;  [facility];  Service: Gynecology;  Laterality: N/A;  With Suction Curette   JOINT REPLACEMENT     left knee replaced 2014   KNEE ARTHROPLASTY Left 03/15/2015   Procedure: COMPUTER ASSISTED TOTAL KNEE ARTHROPLASTY;  Surgeon: Marybelle Killings, MD;  Location: [place];  Service: Orthopedics;  Laterality: Left;   SALPINGOOPHORECTOMY  09/12/2011   Procedure: SALPINGO OOPHERECTOMY;  Surgeon: Janie Morning, MD;  [facility];  Service: Gynecology;  Laterality: Bilateral;   Medications: Reviewed & Updated - see associated section                        Current Outpatient Prescriptions:    albuterol (PROVENTIL HFA;VENTOLIN HFA) 108 (90 Base) MCG/ACT inhaler, Inhale 2 puffs into the lungs every 6 (six) hours as needed for wheezing or shortness of breath. Reported on 02/07/2016, Disp: 1 Inhaler, Rfl: 0   aspirin EC 81 MG tablet, Take 81 mg by mouth at bedtime. , Disp: , Rfl:    atenolol (TENORMIN) 50 MG tablet, Take 25 mg by mouth 2 (two) times daily. , Disp: , Rfl:    cephALEXin (KEFLEX) 500 MG capsule, Take 500 mg by mouth 2 (two) times daily., Disp: , Rfl:    cloNIDine (CATAPRES) 0.3 MG tablet, Take 0.15 mg by mouth 2 (two) times daily. , Disp: , Rfl:    diclofenac (VOLTAREN) 75 MG EC tablet, Take 1 tablet (75 mg total) by mouth 2 (two) times daily. (Patient taking differently: Take 75 mg by mouth every evening. ), Disp: 60 tablet, Rfl: 3   diltiazem (CARDIZEM CD) 180 MG 24 hr capsule, Take 180 mg by mouth daily. , Disp: , Rfl:    diphenhydrAMINE (SOMINEX) 25 MG tablet, Take 25 mg by mouth at bedtime., Disp: , Rfl:    docusate sodium (COLACE) 100 MG capsule, Take 100 mg by mouth daily as needed for mild constipation. , Disp: , Rfl:    esomeprazole (NEXIUM) 40 MG capsule, Take 1 capsule (40 mg total) by mouth every morning., Disp: 30 capsule, Rfl: 11   ferrous sulfate 325 (65 FE) MG EC tablet, Take 325 mg by mouth daily., Disp: , Rfl:    hydroxyurea (HYDREA) 500 MG capsule, TAKE TWO CAPSULES BY MOUTH ONCE DAILY, Disp: 180 capsule, Rfl: 1   losartan (COZAAR) 100 MG tablet, Take 100 mg by mouth every evening. , Disp: , Rfl:    oxyCODONE (OXY IR/ROXICODONE) 5 MG immediate release tablet, Take 1 tablet (5 mg total) by mouth every 4 (four) hours as needed for moderate pain., Disp: 10 tablet, Rfl: 0   Simethicone (GAS RELIEF PO), Take 1 tablet by mouth daily. , Disp: , Rfl:    simvastatin (ZOCOR) 10 MG tablet, Take 10 mg by mouth every evening. , Disp: , Rfl:    furosemide (LASIX) 20 MG tablet, Take 1 tablet (20 mg total) by mouth  daily., Disp: 30 tablet, Rfl: 0   Social History: Reviewed -  reports that she has never smoked. She has never used smokeless tobacco.  Objective Findings:  Vitals: Blood pressure 130/72, pulse 63, height 5\' 4"  (1.626 m), weight 288 lb (130.6 kg).  Physical Examination: General appearance - alert, well appearing, and in no distress Mental status - alert, oriented to person, place, and time Pelvic -  VULVA: brownish discharge VAGINA: tissues atrophic and thinned out, No lesions, masses or sores. Healed well CERVIX: surgically absent UTERUS: surgically absent ADNEXA: not done   Assessment & Plan:   A:  1. Mastitis of left breast, s/p partial mastectomy, currently under treatment by dr. Donne Hazel 2. Atrophic vag tissues, tissue injury secondary to wiping  P:  1 Desitin to soothe tissues will switch to metrogel if pain and bleeding persist. 2. F/u PRN     By signing my name below, I, [name], attest that this documentation has been prepared under the direction and in the presence of Jonnie Kind, MD. Electronically Signed: Jabier Gauss, Medical Scribe. 08/15/17. 2:23 PM.  I personally performed the services described in this documentation, which was SCRIBED in my presence. The recorded information has been reviewed and considered accurate. It has been edited as necessary during review. Jonnie Kind, MD

## 2017-08-16 ENCOUNTER — Ambulatory Visit
Admission: RE | Admit: 2017-08-16 | Discharge: 2017-08-16 | Disposition: A | Discharge status: Home / Self Care | Payer: Medicare Other | Source: Ambulatory Visit | Attending: Radiation Oncology | Admitting: Radiation Oncology

## 2017-08-16 ENCOUNTER — Ambulatory Visit: Payer: Medicare Other

## 2017-08-23 ENCOUNTER — Ambulatory Visit
Admission: RE | Admit: 2017-08-23 | Discharge: 2017-08-23 | Disposition: A | Discharge status: Home / Self Care | Payer: Medicare Other | Source: Ambulatory Visit | Attending: General Surgery | Admitting: General Surgery

## 2017-08-23 ENCOUNTER — Other Ambulatory Visit: Payer: Self-pay | Admitting: General Surgery

## 2017-08-23 DIAGNOSIS — L539 Erythematous condition, unspecified: Secondary | ICD-10-CM

## 2017-08-23 DIAGNOSIS — C50412 Malignant neoplasm of upper-outer quadrant of left female breast: Secondary | ICD-10-CM

## 2017-08-23 DIAGNOSIS — N6321 Unspecified lump in the left breast, upper outer quadrant: Secondary | ICD-10-CM | POA: Diagnosis not present

## 2017-08-23 DIAGNOSIS — N611 Abscess of the breast and nipple: Secondary | ICD-10-CM | POA: Diagnosis not present

## 2017-08-28 LAB — AEROBIC/ANAEROBIC CULTURE (SURGICAL/DEEP WOUND): CULTURE: NO GROWTH

## 2017-08-28 LAB — AEROBIC/ANAEROBIC CULTURE W GRAM STAIN (SURGICAL/DEEP WOUND): Special Requests: NORMAL

## 2017-08-30 ENCOUNTER — Ambulatory Visit
Admission: RE | Admit: 2017-08-30 | Discharge: 2017-08-30 | Disposition: A | Discharge status: Home / Self Care | Payer: Medicare Other | Source: Ambulatory Visit | Attending: General Surgery | Admitting: General Surgery

## 2017-08-30 DIAGNOSIS — N611 Abscess of the breast and nipple: Secondary | ICD-10-CM

## 2017-08-30 DIAGNOSIS — N6489 Other specified disorders of breast: Secondary | ICD-10-CM | POA: Diagnosis not present

## 2017-08-30 DIAGNOSIS — C50412 Malignant neoplasm of upper-outer quadrant of left female breast: Secondary | ICD-10-CM

## 2017-09-04 ENCOUNTER — Ambulatory Visit: Payer: Medicare Other | Admitting: Radiation Oncology

## 2017-09-04 ENCOUNTER — Ambulatory Visit: Admission: RE | Admit: 2017-09-04 | Payer: Medicare Other | Source: Ambulatory Visit | Admitting: Radiation Oncology

## 2017-09-04 ENCOUNTER — Ambulatory Visit: Payer: Medicare Other

## 2017-09-06 ENCOUNTER — Ambulatory Visit (HOSPITAL_COMMUNITY): Payer: Self-pay

## 2017-09-06 ENCOUNTER — Telehealth: Payer: Self-pay | Admitting: Radiation Oncology

## 2017-09-06 ENCOUNTER — Other Ambulatory Visit (HOSPITAL_COMMUNITY): Payer: Self-pay

## 2017-09-06 NOTE — Telephone Encounter (Addendum)
I called the patient to check on her progress since antibiotics. We will await her return call to determine process of scheduling her sim.

## 2017-09-11 ENCOUNTER — Other Ambulatory Visit: Payer: Self-pay | Admitting: General Surgery

## 2017-09-11 DIAGNOSIS — C44619 Basal cell carcinoma of skin of left upper limb, including shoulder: Secondary | ICD-10-CM | POA: Diagnosis not present

## 2017-09-11 DIAGNOSIS — L820 Inflamed seborrheic keratosis: Secondary | ICD-10-CM | POA: Diagnosis not present

## 2017-09-12 ENCOUNTER — Encounter: Payer: Self-pay | Admitting: Radiation Oncology

## 2017-09-14 NOTE — Progress Notes (Signed)
  Locationof Breast Cancer: Left upper outer quadrant of left breast  FUN after 07-10-17 Breast, lumpectomy, Left w/seed to move forward with the simulation and planning process. Had a recent episode 08-15-17 with vaginal bleeding, burning sensation and fever took antibiotics treated by Jonnie Kind, MD OB.  Completed third round of antibiotics three weeks ago.  Histology per Pathology Report:  Diagnosis  07-10-17 1. Breast, lumpectomy, Left w/seed - INVASIVE DUCTAL CARCINOMA, GRADE 2, SPANNING 1.3 CM. - INTERMEDIATE GRADE DUCTAL CARCINOMA IN SITU. - RESECTION MARGINS ARE NEGATIVE FOR CARCINOMA. - BIOPSY SITE. - FIBROCYTIC CHANGE AND USUAL DUCTAL HYPERPLASIA. - SEE ONCOLOGY TABLE. 2. Lymph node, sentinel, biopsy, Left Axillary - ONE OF ONE LYMPH NODES NEGATIVE FOR CARCINOMA (0/1). 3. Lymph node, sentinel, biopsy, Left - METASTATIC CARCINOMA IN ONE OF ONE LYMPH NODE (1/1). 4. Breast, excision, Left Superior Margin - FIBROCYSTIC CHANGE AND USUAL DUCTAL HYPERPLASIA. - NO MALIGNANCY IDENTIFIED. 5. Breast, excision, Left Inferior Margin - COMPLEX SCLEROSING LESION. - FIBROCYSTIC CHANGE WITH CALCIFICATIONS. - NO MALIGNANCY IDENTIFIED. 6. Breast, excision, Left Lateral Margin - COMPLEX SCLEROSING LESION. - FIBROCYSTIC CHANGE AND USUAL DUCTAL HYPERPLASIA. - NO MALIGNANCY IDENTIFIED. Microscopic Comment 1. BREAST, INVASIVE TUMOR Procedure: Left breast lumpectomy with additional superior, inferior, and lateral margins. Left axillary sentinel lymph Receptor Status: ER(5%), PR (0%), Her2-neu (negative), Ki-(15%)  07/10/2017 MM Breast Surgical Specimen FINDINGS: Status post excision of the left breast. The radioactive seed and biopsy marker clip are present, completely intact, and were marked for pathology.  05/29/17 Diagnosis Breast, left, needle core biopsy, upper outer, 2:30 distortion/mass, approx 1.0 cm on mammo and  ultrasound in a region that used to have chronic cysts. - INVASIVE DUCTAL CARCINOMA, GRADE 1/2. - SEE MICROSCOPIC DESCRIPTION. Microscopic Comment Breast prognostic profile will be performed.  Receptor Status: ER(5%), PR (0%), Her2-neu (negative), Ki-(15%)  Did patient present with symptoms (if so, please note symptoms) or was this found on screening mammography?: screening mammogram  Past/Anticipated interventions by surgeon, if any: 07/10/2017 Left Breast Lumpectomy Dr. Donne Hazel   Past/Anticipated interventions by medical oncology, if any:  Dr. Lindi Adie  07-10-17 Mammaprint result negative,Adjuvant radiation therapy followed by Adjuvant radiation therapy  Lymphedema issues, if any: 09-18-17  No   ,feet  edema ongoing Has full ROM with a slight pull under the right axilla, patient reports has to raise her head because of diffcult breathing if she is flat. Patient reports skin discoloration to left breast incision site. Pain issues, if any: No  Labs :07-12-2017   Bun 25  Creatinine 0.91  SAFETY ISSUES:  Prior radiation? No  Pacemaker/ICD? No  Is the patient on methotrexate? No  Current Complaints / other details:  Patient has a history of endometrial cancer - s/p hysterectomy.G5P5,no tobacco use on alcohol or drug  Wt Readings from Last 3 Encounters:  09/18/17 290 lb 3.2 oz (131.6 kg)  08/15/17 288 lb (130.6 kg)  07/27/17 271 lb (122.9 kg)  BP 128/60 (BP Location: Right Arm, Patient Position: Sitting, Cuff Size: Large)   Pulse (!) 59   Temp 98.5 F (36.9 C) (Oral)   Resp 18   Ht '5\' 4"'$  (1.626 m)   Wt 290 lb 3.2 oz (131.6 kg)   SpO2 94%   BMI 49.81 kg/m

## 2017-09-14 NOTE — Progress Notes (Deleted)
FUN   After 07-10-17 Breast, lumpectomy, Left w/seed to move forward with the simulation and planning process.                                                               Location of Breast Cancer: left upper outer breast  Histology per Pathology Report:  Diagnosis  07-17-17 1. Breast, lumpectomy, Left w/seed - INVASIVE DUCTAL CARCINOMA, GRADE 2, SPANNING 1.3 CM. - INTERMEDIATE GRADE DUCTAL CARCINOMA IN SITU. - RESECTION MARGINS ARE NEGATIVE FOR CARCINOMA. - BIOPSY SITE. - FIBROCYTIC CHANGE AND USUAL DUCTAL HYPERPLASIA. - SEE ONCOLOGY TABLE. 2. Lymph node, sentinel, biopsy, Left Axillary - ONE OF ONE LYMPH NODES NEGATIVE FOR CARCINOMA (0/1). 3. Lymph node, sentinel, biopsy, Left - METASTATIC CARCINOMA IN ONE OF ONE LYMPH NODE (1/1). 4. Breast, excision, Left Superior Margin - FIBROCYSTIC CHANGE AND USUAL DUCTAL HYPERPLASIA. - NO MALIGNANCY IDENTIFIED. 5. Breast, excision, Left Inferior Margin - COMPLEX SCLEROSING LESION. - FIBROCYSTIC CHANGE WITH CALCIFICATIONS. - NO MALIGNANCY IDENTIFIED. 6. Breast, excision, Left Lateral Margin - COMPLEX SCLEROSING LESION. - FIBROCYSTIC CHANGE AND USUAL DUCTAL HYPERPLASIA. - NO MALIGNANCY IDENTIFIED. Microscopic Comment 1. BREAST, INVASIVE TUMOR Procedure: Left breast lumpectomy with additional superior, inferior, and lateral margins. Left axillary sentinel lymph Receptor Status: ER(5%), PR (0%), Her2-neu (negative), Ki-(15%)  07/10/2017 MM Breast Surgical Specimen FINDINGS: Status post excision of the left breast. The radioactive seed and biopsy marker clip are present, completely intact, and were marked for pathology.  05/29/17 Diagnosis Breast, left, needle core biopsy, upper outer, 2:30 distortion/mass, approx 1.0 cm on mammo and ultrasound in a region that used to have chronic cysts. - INVASIVE DUCTAL CARCINOMA, GRADE 1/2. - SEE MICROSCOPIC DESCRIPTION. Microscopic Comment Breast prognostic profile will be  performed.  Receptor Status: ER(5%), PR (0%), Her2-neu (negative), Ki-(15%)  Did patient present with symptoms (if so, please note symptoms) or was this found on screening mammography?: screening mammogram  Past/Anticipated interventions by surgeon, if any: 07/10/2017 Left Breast Lumpectomy Dr. Donne Hazel   Past/Anticipated interventions by medical oncology, if any:  Dr. Lindi Adie  07-10-17 Mammaprint result negative,Adjuvant radiation therapy followed by Adjuvant radiation therapy  Lymphedema issues, if any: 08-16-17        ,feet    Pain issues, if any:   Labs :07-12-2017   Bun 25  Creatinine 0.91  SAFETY ISSUES:  Prior radiation? No  Pacemaker/ICD? No  Is the patient on methotrexate? No  Current Complaints / other details:  Patient has a history of endometrial cancer - s/p hysterectomy.G5P5,no tobacco use on alcohol or drug

## 2017-09-16 ENCOUNTER — Other Ambulatory Visit (HOSPITAL_COMMUNITY): Payer: Self-pay | Admitting: Oncology

## 2017-09-16 DIAGNOSIS — C541 Malignant neoplasm of endometrium: Secondary | ICD-10-CM

## 2017-09-17 NOTE — Progress Notes (Signed)
  Locationof Breast Cancer: Left upper outer quadrant of left breast  FUN after 07-10-17 Breast, lumpectomy, Left w/seed to move forward with the simulation and planning process. Had a recent episode 08-15-17 with vaginal bleeding, burning sensation and fever took antibiotics treated by Jonnie Kind, MD OB.  Histology per Pathology Report:  Diagnosis  07-10-17 1. Breast, lumpectomy, Left w/seed - INVASIVE DUCTAL CARCINOMA, GRADE 2, SPANNING 1.3 CM. - INTERMEDIATE GRADE DUCTAL CARCINOMA IN SITU. - RESECTION MARGINS ARE NEGATIVE FOR CARCINOMA. - BIOPSY SITE. - FIBROCYTIC CHANGE AND USUAL DUCTAL HYPERPLASIA. - SEE ONCOLOGY TABLE. 2. Lymph node, sentinel, biopsy, Left Axillary - ONE OF ONE LYMPH NODES NEGATIVE FOR CARCINOMA (0/1). 3. Lymph node, sentinel, biopsy, Left - METASTATIC CARCINOMA IN ONE OF ONE LYMPH NODE (1/1). 4. Breast, excision, Left Superior Margin - FIBROCYSTIC CHANGE AND USUAL DUCTAL HYPERPLASIA. - NO MALIGNANCY IDENTIFIED. 5. Breast, excision, Left Inferior Margin - COMPLEX SCLEROSING LESION. - FIBROCYSTIC CHANGE WITH CALCIFICATIONS. - NO MALIGNANCY IDENTIFIED. 6. Breast, excision, Left Lateral Margin - COMPLEX SCLEROSING LESION. - FIBROCYSTIC CHANGE AND USUAL DUCTAL HYPERPLASIA. - NO MALIGNANCY IDENTIFIED. Microscopic Comment 1. BREAST, INVASIVE TUMOR Procedure: Left breast lumpectomy with additional superior, inferior, and lateral margins. Left axillary sentinel lymph Receptor Status: ER(5%), PR (0%), Her2-neu (negative), Ki-(15%)  07/10/2017 MM Breast Surgical Specimen FINDINGS: Status post excision of the left breast. The radioactive seed and biopsy marker clip are present, completely intact, and were marked for pathology.  05/29/17 Diagnosis Breast, left, needle core biopsy, upper outer, 2:30 distortion/mass, approx 1.0 cm on mammo and ultrasound in a region that used to have chronic  cysts. - INVASIVE DUCTAL CARCINOMA, GRADE 1/2. - SEE MICROSCOPIC DESCRIPTION. Microscopic Comment Breast prognostic profile will be performed.  Receptor Status: ER(5%), PR (0%), Her2-neu (negative), Ki-(15%)  Did patient present with symptoms (if so, please note symptoms) or was this found on screening mammography?: screening mammogram  Past/Anticipated interventions by surgeon, if any: 07/10/2017 Left Breast Lumpectomy Dr. Donne Hazel seen on 08/23/17,  :US BREAST LTD UNI LEFT INC AXILLA 08/30/17: IMPRESSION: Fluid collection in the 2 o'clock position of the left breast 8 cm from the nipple most consistent with benign postoperative seroma. Fluid from this collection was recently aspirated and was negative for growth at culture.  RECOMMENDATION: Continued follow-up with Dr. Donne Hazel. Bilateral diagnostic mammogram is due in June 2019, unless earlier imaging is needed.  Past/Anticipated interventions by medical oncology, if any:  Dr. Lindi Adie  07-10-17 Mammaprint result negative,Adjuvant radiation therapy followed by Adjuvant radiation therapy   Lymphedema issues, if any: 09-18-17       ,feet    Pain issues, if any:   Labs :07-12-2017   Bun 25  Creatinine 0.91  SAFETY ISSUES:  Prior radiation? No  Pacemaker/ICD? No  Is the patient on methotrexate? No  Current Complaints / other details:  Patient has a history of endometrial cancer - s/p hysterectomy.G5P5,no tobacco use on alcohol or drug

## 2017-09-18 ENCOUNTER — Ambulatory Visit
Admission: RE | Admit: 2017-09-18 | Discharge: 2017-09-18 | Disposition: A | Discharge status: Home / Self Care | Payer: Medicare Other | Source: Ambulatory Visit | Attending: Radiation Oncology | Admitting: Radiation Oncology

## 2017-09-18 ENCOUNTER — Encounter: Payer: Self-pay | Admitting: Radiation Oncology

## 2017-09-18 ENCOUNTER — Other Ambulatory Visit: Payer: Self-pay

## 2017-09-18 VITALS — BP 128/60 | HR 59 | Temp 98.5°F | Resp 18 | Ht 64.0 in | Wt 290.2 lb

## 2017-09-18 DIAGNOSIS — R011 Cardiac murmur, unspecified: Secondary | ICD-10-CM | POA: Insufficient documentation

## 2017-09-18 DIAGNOSIS — C50412 Malignant neoplasm of upper-outer quadrant of left female breast: Secondary | ICD-10-CM

## 2017-09-18 DIAGNOSIS — I10 Essential (primary) hypertension: Secondary | ICD-10-CM | POA: Diagnosis not present

## 2017-09-18 DIAGNOSIS — K469 Unspecified abdominal hernia without obstruction or gangrene: Secondary | ICD-10-CM | POA: Insufficient documentation

## 2017-09-18 DIAGNOSIS — K219 Gastro-esophageal reflux disease without esophagitis: Secondary | ICD-10-CM | POA: Insufficient documentation

## 2017-09-18 DIAGNOSIS — C773 Secondary and unspecified malignant neoplasm of axilla and upper limb lymph nodes: Secondary | ICD-10-CM | POA: Diagnosis not present

## 2017-09-18 DIAGNOSIS — D649 Anemia, unspecified: Secondary | ICD-10-CM | POA: Diagnosis not present

## 2017-09-18 DIAGNOSIS — Z170 Estrogen receptor positive status [ER+]: Secondary | ICD-10-CM | POA: Diagnosis not present

## 2017-09-18 DIAGNOSIS — Z8701 Personal history of pneumonia (recurrent): Secondary | ICD-10-CM | POA: Insufficient documentation

## 2017-09-18 DIAGNOSIS — Z8542 Personal history of malignant neoplasm of other parts of uterus: Secondary | ICD-10-CM | POA: Insufficient documentation

## 2017-09-18 NOTE — Progress Notes (Signed)
  Radiation Oncology         (336) [phone] ________________________________  Name: [name] MRN: [id number]  Date: 09/18/2017  DOB: January 01, 1941  Diagnosis DIAGNOSIS:     ICD-10-CM   1. Malignant neoplasm of upper-outer quadrant of left breast in female, estrogen receptor positive ([place]) C50.412    Z17.0      SIMULATION AND TREATMENT PLANNING NOTE  The patient presented for simulation prior to beginning her course of radiation treatment for her diagnosis of left-sided breast cancer. The patient was placed in a supine position on a breast board. A customized vac-lock bag was also constructed and this complex treatment device will be used on a daily basis during her treatment. In this fashion, a CT scan was obtained through the chest area and an isocenter was placed near the chest wall at the upper aspect of the right chest. A breath-hold technique has also been evaluated to determine if this significantly improves the spatial relationship between the target region and the heart. Based on this analysis, a breath-hold technique has not been ordered for the patient's treatment.  The patient will be planned to receive a course of radiation initially to a dose of 50.4 gray. This will consist of a 4 field technique targeting the left breast as well as the supraclavicular region. Therefore 2 customized medial and lateral tangent fields have been created targeting the chest wall, and also 2 additional customized fields have been designed to treat the supraclavicular region both with a left supraclavicular field and a left posterior axillary boost field. A forward planning/reduced field technique will also be evaluated to determine if this significantly improves the dose homogeneity of the overall plan. Therefore, additional customized blocks/fields may be necessary.  This initial treatment will be accomplished at 1.8 gray per fraction.   The initial plan will consist of a 3-D conformal technique. The  target volume/scar, heart and lungs have been contoured and dose volume histograms of each of these structures will be evaluated as part of the 3-D conformal treatment planning process.   It is anticipated that the patient will then receive a 10 gray boost to the surgical scar. This will be accomplished at 2 gray per fraction. The final anticipated total dose therefore will correspond to 60.4 gray.    _______________________________   Jodelle Gross, MD, PhD

## 2017-09-18 NOTE — Progress Notes (Signed)
  Radiation Oncology         [phone]) [phone] ________________________________  Name: [name] MRN: [id number]  Date: 09/18/2017  DOB: 1941-06-23  Optical Surface Tracking Plan:  Since intensity modulated radiotherapy (IMRT) and 3D conformal radiation treatment methods are predicated on accurate and precise positioning for treatment, intrafraction motion monitoring is medically necessary to ensure accurate and safe treatment delivery.  The ability to quantify intrafraction motion without excessive ionizing radiation dose can only be performed with optical surface tracking. Accordingly, surface imaging offers the opportunity to obtain 3D measurements of patient position throughout IMRT and 3D treatments without excessive radiation exposure.  I am ordering optical surface tracking for this patient's upcoming course of radiotherapy. ________________________________  Kyung Rudd, MD 09/18/2017 4:46 PM    Reference:   Ursula Alert, J, et al. Surface imaging-based analysis of intrafraction motion for breast radiotherapy patients.Journal of [place], n. 6, nov. 2014. ISSN [id number].   Available at: <http://www.jacmp.org/index.php/jacmp/article/view/4957>.

## 2017-09-18 NOTE — Addendum Note (Signed)
Encounter addended by: Malena Edman, RN on: 09/18/2017 2:57 PM  Actions taken: Charge Capture section accepted

## 2017-09-18 NOTE — Progress Notes (Signed)
Radiation Oncology         (336) [phone] ________________________________  Name: [name]        MRN: [id number]  Date of Service: 09/18/2017 DOB: May 30, 1941  SW:FUXNA, Carloyn Manner, MD  [name], MD     REFERRING PHYSICIAN: [name], MD   DIAGNOSIS: The encounter diagnosis was Malignant neoplasm of upper-outer quadrant of left breast in female, estrogen receptor positive ([place]).  HISTORY OF PRESENT ILLNESS: [name] is a 76 y.o. female seen at the request of Dr. Donne Hazel for a new diagnosis of left breast cancer. She has a history of endometrial cancer treated surgically in 2012, and a history of JAK2+ myeloproliferative disorder and is followed for her bone marrow dysfunction by hematology/oncology at [facility]. She was encouraged to continue screening mammogram and this revealed a possible left breast mass when performed on 05/04/17. She returned for diagnostic imaging which revealed a 8 x 8 x 15 mm mass in the left breast at 2:30. A few cysts were also seen and no adenopathy was noted in the axilla.   A biopsy on 05/29/17 revealed a weakly ER positive (5%), PR and HER2 negative, grade 1-2 invasive ductal carcinoma of the left breast with Ki 67 of 15%. She underwent Oncotype score which was 37. She then had surgery on 07/10/17 which revealed a grade 2, 1.3 cm invasive ductal carcinoma and intermediate DCIS of the left breast. 1/2 of her nodes were positive for disease. She had a postoperative mammaprint test that was low risk. Dr. Lindi Adie is assuming her care in [place] and has discussed the discrepancy in her oncotype and mammaprint but recommends relying on the mammaprint results and does not recommend chemotherapy.   PREVIOUS RADIATION THERAPY: No   PAST MEDICAL HISTORY:  Past Medical History:  Diagnosis Date  . Anemia 04/02/2015  . Arthritis   . Atypical glandular cells on vaginal Papanicolaou smear 04/12/2016   Well get colpo and biopsy with JVF  . Bronchitis, chronic  obstructive ([place]) since 2000's  . Cancer [facility])    dx with endometrial cancer   . Dysrhythmia   . GERD (gastroesophageal reflux disease)   . Gout   . Heart murmur   . Hiatal hernia   . Hiatal hernia 11/19/2012  . History of endometrial cancer 04/06/2016   Sp hysterectomy 2012  . Hypertension   . Ischemic colitis ([place])    03/2015  . Myeloproliferative disorder, JAK-2 positive 01/05/2014  . Obesity   . Obesity, morbid (more than 100 lbs over ideal weight or BMI > 40) ([facility]) 08/2011   Ht 5'7", wt 280 lb  . Pneumonia 09/2014  . Polycythemia vera(238.4) 01/18/2014  . PONV (postoperative nausea and vomiting)    post-op hypoxic resp failure 09/2011 and require Bi-Pap (possible r/t - pressure pulm edema vs asp PNA, COPD exacerbation, obesity hypoventilation  . Shortness of breath    unable to lay flat due to dyspnea  . Vaginal Pap smear, abnormal        PAST SURGICAL HISTORY: Past Surgical History:  Procedure Laterality Date  . BREAST LUMPECTOMY Left 07/10/2017   LEFT BREAST LUMPECTOMY WITH RADIOACTIVE SEED AND SENTINEL LYMPH NODE BIOPSY   . EYE SURGERY     bilateral 8 - 12 yrs ago  . JOINT REPLACEMENT     left knee replaced 2014     FAMILY HISTORY:  Family History  Problem Relation Age of Onset  . Other Mother   . Lung cancer Father   .  Hypertension Son   . Anesthesia problems Neg Hx   . Hypotension Neg Hx   . Malignant hyperthermia Neg Hx   . Pseudochol deficiency Neg Hx      SOCIAL HISTORY:  reports that  has never smoked. she has never used smokeless tobacco. She reports that she does not drink alcohol or use drugs.The patient is married and lives in [place].   ALLERGIES: Carbamazepine and Sulfa antibiotics   MEDICATIONS:  Current Outpatient Medications  Medication Sig Dispense Refill  . aspirin EC 81 MG tablet Take 81 mg by mouth at bedtime.     Marland Kitchen atenolol (TENORMIN) 50 MG tablet Take 25 mg by mouth 2 (two) times daily.     . cloNIDine (CATAPRES) 0.3 MG tablet  Take 0.15 mg by mouth 2 (two) times daily.     Marland Kitchen diltiazem (CARDIZEM CD) 180 MG 24 hr capsule Take 180 mg by mouth daily.     . diphenhydrAMINE (SOMINEX) 25 MG tablet Take 25 mg by mouth at bedtime.    Marland Kitchen esomeprazole (NEXIUM) 40 MG capsule Take 1 capsule (40 mg total) by mouth every morning. 30 capsule 11  . ferrous sulfate 325 (65 FE) MG EC tablet Take 325 mg by mouth daily.    . furosemide (LASIX) 20 MG tablet Take 1 tablet (20 mg total) by mouth daily. 30 tablet 0  . hydroxyurea (HYDREA) 500 MG capsule TAKE TWO CAPSULES BY MOUTH ONCE DAILY 180 capsule 1  . losartan (COZAAR) 100 MG tablet Take 100 mg by mouth every evening.     . Simethicone (GAS RELIEF PO) Take 1 tablet by mouth daily.     . simvastatin (ZOCOR) 10 MG tablet Take 10 mg by mouth every evening.     Marland Kitchen albuterol (PROVENTIL HFA;VENTOLIN HFA) 108 (90 Base) MCG/ACT inhaler Inhale 2 puffs into the lungs every 6 (six) hours as needed for wheezing or shortness of breath. Reported on 02/07/2016 (Patient not taking: Reported on 09/18/2017) 1 Inhaler 0  . diclofenac (VOLTAREN) 75 MG EC tablet Take 1 tablet (75 mg total) by mouth 2 (two) times daily. (Patient not taking: Reported on 09/18/2017) 60 tablet 3  . docusate sodium (COLACE) 100 MG capsule Take 100 mg by mouth daily as needed for mild constipation.     Marland Kitchen oxyCODONE (OXY IR/ROXICODONE) 5 MG immediate release tablet Take 1 tablet (5 mg total) by mouth every 4 (four) hours as needed for moderate pain. (Patient not taking: Reported on 09/18/2017) 10 tablet 0   No current facility-administered medications for this encounter.      REVIEW OF SYSTEMS: On review of systems, the patient reports that she is doing well overall. She denies any chest pain, shortness of breath, cough, fevers, chills, night sweats, unintended weight changes. She denies any bowel or bladder disturbances, and denies abdominal pain, nausea or vomiting. She denies any new musculoskeletal or joint aches or pains. A complete  review of systems is obtained and is otherwise negative.     PHYSICAL EXAM:  Wt Readings from Last 3 Encounters:  09/18/17 290 lb 3.2 oz (131.6 kg)  08/15/17 288 lb (130.6 kg)  07/27/17 271 lb (122.9 kg)   Temp Readings from Last 3 Encounters:  09/18/17 98.5 F (36.9 C) (Oral)  07/24/17 97.7 F (36.5 C) (Oral)  07/12/17 98.2 F (36.8 C) (Oral)   BP Readings from Last 3 Encounters:  09/18/17 128/60  08/15/17 130/72  07/27/17 (!) 112/52   Pulse Readings from Last 3 Encounters:  09/18/17 Marland Kitchen)  59  08/15/17 63  07/27/17 64   Pain Assessment Pain Score: 0-No pain/10  In general this is a well appearing caucasian female in no acute distress. She's alert and oriented x4 and appropriate throughout the examination. Cardiopulmonary assessment is negative for acute distress and she exhibits normal effort. Her left breast is intact with a well healed lumpectomy and axillary incision site.There is some fullness deep in the breast consistent with induration from prior surgery. No fluctuance is noted. No cellulitic appearing changes are noted.     ECOG = 0  0 - Asymptomatic (Fully active, able to carry on all predisease activities without restriction)  1 - Symptomatic but completely ambulatory (Restricted in physically strenuous activity but ambulatory and able to carry out work of a light or sedentary nature. For example, light housework, office work)  2 - Symptomatic, <50% in bed during the day (Ambulatory and capable of all self care but unable to carry out any work activities. Up and about more than 50% of waking hours)  3 - Symptomatic, >50% in bed, but not bedbound (Capable of only limited self-care, confined to bed or chair 50% or more of waking hours)  4 - Bedbound (Completely disabled. Cannot carry on any self-care. Totally confined to bed or chair)  5 - Death   Eustace Pen MM, Creech RH, Tormey DC, et al. [phone]). "Toxicity and response criteria of the [facility]  Group". [place] Oncol. 5 (6): 649-55    LABORATORY DATA:  Lab Results  Component Value Date   WBC 8.7 07/11/2017   HGB 12.2 07/11/2017   HCT 37.7 07/11/2017   MCV 114.6 (H) 07/11/2017   PLT 187 07/11/2017   Lab Results  Component Value Date   NA 140 07/12/2017   K 4.2 07/12/2017   CL 104 07/12/2017   CO2 30 07/12/2017   Lab Results  Component Value Date   ALT 17 03/06/2017   AST 20 03/06/2017   ALKPHOS 79 03/06/2017   BILITOT 0.8 03/06/2017      RADIOGRAPHY: US Breast Ltd Uni Left Inc Axilla  Result Date: 08/30/2017 CLINICAL DATA:  76 year old patient presents for follow-up of the left breast. She was seen by Dr. Donne Hazel earlier this month for erythema, pain and some palpable firmness in the upper outer left breast in the region of her recent lumpectomy changes. Lumpectomy for cancer was performed July 10, 2017. She was treated for presumed cellulitis of the left breast, and ultrasound of the left breast was performed showing a complex fluid collection at the postoperative site. This was aspirated on August 23, 2017 at the [facility] and the culture showed no organisms, and no growth aerobically or anaerobically. Given no growth on culture and recent lumpectomy, findings suggest that this was a postoperative seroma. She reports significant improvement after antibiotic therapy of the erythema pain and firmness in the upper outer left breast. She has approximately 1 more day of antibiotics left to take. EXAM: ULTRASOUND OF THE LEFT BREAST COMPARISON:  Previous exam(s). FINDINGS: On physical exam, minimal if any residual erythema of the upper-outer quadrant of the left breast. Upper outer periareolar lumpectomy scar. There is some palpable firmness in the 2 o'clock position of the left breast. Targeted ultrasound is performed, showing an oval fluid collection with some scattered echogenic bands within it at 2 o'clock position 8 cm from the nipple that measures  approximately 4.5 x 2.6 x 2 6.5 cm. The wall of this fluid collection is thin. The  overlying skin thickness is normal. There is no breast edema in this region. IMPRESSION: Fluid collection in the 2 o'clock position of the left breast 8 cm from the nipple most consistent with benign postoperative seroma. Fluid from this collection was recently aspirated and was negative for growth at culture. RECOMMENDATION: Continued follow-up with Dr. Donne Hazel. Bilateral diagnostic mammogram is due in June 2019, unless earlier imaging is needed. I have discussed the findings and recommendations with the patient. Results were also provided in writing at the conclusion of the visit. If applicable, a reminder letter will be sent to the patient regarding the next appointment. BI-RADS CATEGORY  2: Benign. Electronically Signed   By: Curlene Dolphin M.D.   On: 08/30/2017 10:22   US Breast Ltd Uni Left Inc Axilla  Result Date: 08/23/2017 CLINICAL DATA:  Status post breast conservation surgery in September of 2018. Patient now presents with a palpable mass in the outer left breast and overlying erythema. EXAM: ULTRASOUND OF THE LEFT BREAST COMPARISON:  Previous exam(s). FINDINGS: Targeted ultrasound is performed, showing a large mildly complex fluid collection in the left breast at the 2:30 o'clock axis, 8 cm from the nipple, measuring approximately 7 cm greatest dimension. IMPRESSION: Mildly complex fluid collection in the left breast at the 2:30 o'clock axis, 8 cm from nipple, measuring approximately 7 cm, corresponding to the area of clinical concern, suspected abscess. RECOMMENDATION: Ultrasound-guided aspiration of the fluid collection in the outer left breast, presumed abscess. I have discussed the findings and recommendations with the patient. Results were also provided in writing at the conclusion of the visit. If applicable, a reminder letter will be sent to the patient regarding the next appointment. BI-RADS CATEGORY  2:  Benign. Electronically Signed   By: Franki Cabot M.D.   On: 08/23/2017 15:43   US Breast Aspiration Left  Addendum Date: 08/30/2017   ADDENDUM REPORT: 08/30/2017 14:39 ADDENDUM: Left breast aspiration yielded Gram Stain-RARE WBC PRESENT, PREDOMINANTLY MONONUCLEAR, NO ORGANISMS SEEN. Culture-No growth aerobically or anaerobically. The patient was notified of results in person by Dr. Curlene Dolphin on August 30, 2017. The patient reported significant improvement after antibiotic therapy of the pain and firmness. The patient returned to [facility] on August 30, 2017 for a Right breast ultrasound to ensure continued resolution of the seroma. This procedure is dictated in a separate report. She was instructed to call for any additional questions or concerns. Pathology results reported by Terie Purser, RN on 08/30/2017. Electronically Signed   By: Franki Cabot M.D.   On: 08/30/2017 14:39   Result Date: 08/30/2017 CLINICAL DATA:  Status post breast conservation surgery in September of 2018. Patient with a mildly complex fluid collection demonstrated on ultrasound earlier today in the outer left breast presents now for ultrasound-guided aspiration. EXAM: ULTRASOUND GUIDED LEFT BREAST CYST ASPIRATION COMPARISON:  Previous exams. PROCEDURE: Using sterile technique, 1% lidocaine, under direct ultrasound visualization, needle aspiration of the left breast fluid collection was performed. 20 cc of serosanguineous fluid was sent to the laboratory for culture and sensitivity. A total of 140 cc was removed. The fluid collection was completely aspirated. IMPRESSION: Ultrasound-guided aspiration of the large fluid collection in the outer left breast. Collection was completely aspirated. 20 cc was sent to the laboratory for culture and sensitivity. No apparent complications. RECOMMENDATIONS: 1. Ten day course of Augmentin was prescribed. 2. Patient will return for follow-up ultrasound in 7 days to  exclude reaccumulation. Patient was encouraged to return sooner if the  associated palpable finding returned. Findings and plan discussed with Dr. Donne Hazel at the conclusion of the procedure. Electronically Signed: By: Franki Cabot M.D. On: 08/23/2017 15:48       IMPRESSION/PLAN: 1. Stage IIA, pT1cN1aMx ER weakly positive, PR/HER2 negative, grade 2 invasive ductal carcinoma of the left breast. Dr. Lisbeth Renshaw reviews her final pathology findings and reviews the nature of left breast cancer disease. Her mammaprint score was low risk and she is ready to move forward with simulation and external radiotherapy to the breast and regional nodes. Again we reviewed the discrepancy of oncotype and mammaprint with Dr. Lindi Adie and he does not recommend chemotherapy. We discussed the risks, benefits, short, and long term effects of radiotherapy, and the patient is interested in proceeding. Dr. Lisbeth Renshaw discusses the delivery and logistics of radiotherapy including deep inspiration breath hold technique for 6 1/2 weeks. She will simulate today. Written consent is obtained and placed in the chart, a copy was provided to the patient. 2. Myeloproliferative disorder. The patient will follow up with Dr. Lindi Adie next with with this.   In a visit lasting 25 minutes, greater than 50% of the time was spent face to face discussing her treatment recommendations, and coordinating the patient's care.  The above documentation reflects my direct findings during this shared patient visit. Please see the separate note by Dr. Lisbeth Renshaw on this date for the remainder of the patient's plan of care.    Carola Rhine, PAC

## 2017-09-21 ENCOUNTER — Other Ambulatory Visit: Payer: Self-pay

## 2017-09-21 DIAGNOSIS — R011 Cardiac murmur, unspecified: Secondary | ICD-10-CM | POA: Diagnosis not present

## 2017-09-21 DIAGNOSIS — D649 Anemia, unspecified: Secondary | ICD-10-CM | POA: Diagnosis not present

## 2017-09-21 DIAGNOSIS — I10 Essential (primary) hypertension: Secondary | ICD-10-CM | POA: Diagnosis not present

## 2017-09-21 DIAGNOSIS — C50412 Malignant neoplasm of upper-outer quadrant of left female breast: Secondary | ICD-10-CM

## 2017-09-21 DIAGNOSIS — Z170 Estrogen receptor positive status [ER+]: Secondary | ICD-10-CM | POA: Diagnosis not present

## 2017-09-21 DIAGNOSIS — K469 Unspecified abdominal hernia without obstruction or gangrene: Secondary | ICD-10-CM | POA: Diagnosis not present

## 2017-09-21 DIAGNOSIS — D480 Neoplasm of uncertain behavior of bone and articular cartilage: Secondary | ICD-10-CM

## 2017-09-21 DIAGNOSIS — C541 Malignant neoplasm of endometrium: Secondary | ICD-10-CM

## 2017-09-21 DIAGNOSIS — K219 Gastro-esophageal reflux disease without esophagitis: Secondary | ICD-10-CM | POA: Diagnosis not present

## 2017-09-24 ENCOUNTER — Other Ambulatory Visit (HOSPITAL_BASED_OUTPATIENT_CLINIC_OR_DEPARTMENT_OTHER): Payer: Medicare Other

## 2017-09-24 ENCOUNTER — Ambulatory Visit (HOSPITAL_BASED_OUTPATIENT_CLINIC_OR_DEPARTMENT_OTHER): Payer: Medicare Other | Admitting: Hematology and Oncology

## 2017-09-24 DIAGNOSIS — C541 Malignant neoplasm of endometrium: Secondary | ICD-10-CM

## 2017-09-24 DIAGNOSIS — Z170 Estrogen receptor positive status [ER+]: Secondary | ICD-10-CM | POA: Diagnosis not present

## 2017-09-24 DIAGNOSIS — D480 Neoplasm of uncertain behavior of bone and articular cartilage: Secondary | ICD-10-CM

## 2017-09-24 DIAGNOSIS — C50412 Malignant neoplasm of upper-outer quadrant of left female breast: Secondary | ICD-10-CM

## 2017-09-24 LAB — CBC WITH DIFFERENTIAL/PLATELET
BASO%: 0.8 % (ref 0.0–2.0)
Basophils Absolute: 0 10*3/uL (ref 0.0–0.1)
EOS%: 2.1 % (ref 0.0–7.0)
Eosinophils Absolute: 0.1 10*3/uL (ref 0.0–0.5)
HCT: 39.4 % (ref 34.8–46.6)
HEMOGLOBIN: 13.2 g/dL (ref 11.6–15.9)
LYMPH%: 29.9 % (ref 14.0–49.7)
MCH: 38.4 pg — ABNORMAL HIGH (ref 25.1–34.0)
MCHC: 33.4 g/dL (ref 31.5–36.0)
MCV: 115 fL — ABNORMAL HIGH (ref 79.5–101.0)
MONO#: 0.4 10*3/uL (ref 0.1–0.9)
MONO%: 8.6 % (ref 0.0–14.0)
NEUT%: 58.6 % (ref 38.4–76.8)
NEUTROS ABS: 2.5 10*3/uL (ref 1.5–6.5)
Platelets: 241 10*3/uL (ref 145–400)
RBC: 3.43 10*6/uL — ABNORMAL LOW (ref 3.70–5.45)
RDW: 13.7 % (ref 11.2–14.5)
WBC: 4.3 10*3/uL (ref 3.9–10.3)
lymph#: 1.3 10*3/uL (ref 0.9–3.3)

## 2017-09-24 LAB — DRAW EXTRA CLOT TUBE

## 2017-09-24 LAB — COMPREHENSIVE METABOLIC PANEL
ALBUMIN: 3.4 g/dL — AB (ref 3.5–5.0)
ALK PHOS: 77 U/L (ref 40–150)
ALT: 15 U/L (ref 0–55)
AST: 17 U/L (ref 5–34)
Anion Gap: 7 mEq/L (ref 3–11)
BILIRUBIN TOTAL: 0.57 mg/dL (ref 0.20–1.20)
BUN: 17.7 mg/dL (ref 7.0–26.0)
CO2: 31 mEq/L — ABNORMAL HIGH (ref 22–29)
Calcium: 9 mg/dL (ref 8.4–10.4)
Chloride: 103 mEq/L (ref 98–109)
Creatinine: 0.8 mg/dL (ref 0.6–1.1)
EGFR: >60 mL/min/{1.73_m2} (ref >60)
GLUCOSE: 128 mg/dL (ref 70–140)
POTASSIUM: 4 meq/L (ref 3.5–5.1)
SODIUM: 141 meq/L (ref 136–145)
TOTAL PROTEIN: 7.1 g/dL (ref 6.4–8.3)

## 2017-09-24 NOTE — Assessment & Plan Note (Addendum)
07/10/2017 Left lumpectomy: IDC grade 2, 1.3 cm, intermediate grade DCIS, margins negative, 0/2 lymph nodes negative, ER 5% positive weak staining, PR 0%, HER-2 negative ratio 1.73, Ki-67 15%, T1cN0 stage IA AJCC 8  Mammaprint: Low risk luminal type A, average 10-year risk of recurrence untreated 10% I counseled her extensively regarding the results of the Mammaprint and provided her with a copy of the test.  Recommendation: 1.  Adjuvant radiation therapy 2. followed by adjuvant antiestrogen therapy with anastrozole 1 mg daily times 5-7 years  Return to clinic at the end of radiation to start antiestrogen therapy

## 2017-09-24 NOTE — Progress Notes (Signed)
Patient Care Team: [name], MD as PCP - General (Internal Medicine) Jonnie Kind, MD as Consulting Physician (Obstetrics and Gynecology) Janie Morning, MD as Attending Physician (Obstetrics and Gynecology) Carole Civil, MD as Consulting Physician (Orthopedic Surgery)  DIAGNOSIS:  Encounter Diagnosis  Name Primary?  . Malignant neoplasm of upper-outer quadrant of left breast in female, estrogen receptor positive ([place])     SUMMARY OF ONCOLOGIC HISTORY:   Malignant neoplasm of upper-outer quadrant of left breast in female, estrogen receptor positive ([place])   07/10/2017 Surgery    Left lumpectomy: IDC grade 2, 1.3 cm, intermediate grade DCIS, margins negative, 1/2 lymph nodes positive, ER 5% positive weak staining, PR 0%, HER-2 negative ratio 1.73, Ki-67 15%, T1cN0 stage IA AJCC 8      07/10/2017 Miscellaneous    Mammaprint: Low risk, 10-year risk of recurrence untreated 10%       CHIEF COMPLIANT: Follow-up to discuss Mammaprint test results  INTERVAL HISTORY: [name] is a 76 year old with above-mentioned history of left lumpectomy who had a very prolonged healing from the surgery with redness and a seroma that had to be drained.  She is on multiple antibiotics.  She has finally healed properly and is ready to begin her radiation next week.  She is here today to discuss the results of the Mammaprint tests.  REVIEW OF SYSTEMS:   Constitutional: Denies fevers, chills or abnormal weight loss Eyes: Denies blurriness of vision Ears, nose, mouth, throat, and face: Denies mucositis or sore throat Respiratory: Denies cough, dyspnea or wheezes Cardiovascular: Denies palpitation, chest discomfort Gastrointestinal:  Denies nausea, heartburn or change in bowel habits Skin: Denies abnormal skin rashes Lymphatics: Denies new lymphadenopathy or easy bruising Neurological:Denies numbness, tingling or new weaknesses Behavioral/Psych: Mood is stable, no new changes    Extremities: No lower extremity edema Breast: Breast cellulitis has improved All other systems were reviewed with the patient and are negative.  I have reviewed the past medical history, past surgical history, social history and family history with the patient and they are unchanged from previous note.  ALLERGIES:  is allergic to carbamazepine and sulfa antibiotics.  MEDICATIONS:  Current Outpatient Medications  Medication Sig Dispense Refill  . albuterol (PROVENTIL HFA;VENTOLIN HFA) 108 (90 Base) MCG/ACT inhaler Inhale 2 puffs into the lungs every 6 (six) hours as needed for wheezing or shortness of breath. Reported on 02/07/2016 (Patient not taking: Reported on 09/18/2017) 1 Inhaler 0  . aspirin EC 81 MG tablet Take 81 mg by mouth at bedtime.     Marland Kitchen atenolol (TENORMIN) 50 MG tablet Take 25 mg by mouth 2 (two) times daily.     . cloNIDine (CATAPRES) 0.3 MG tablet Take 0.15 mg by mouth 2 (two) times daily.     . diclofenac (VOLTAREN) 75 MG EC tablet Take 1 tablet (75 mg total) by mouth 2 (two) times daily. (Patient not taking: Reported on 09/18/2017) 60 tablet 3  . diltiazem (CARDIZEM CD) 180 MG 24 hr capsule Take 180 mg by mouth daily.     . diphenhydrAMINE (SOMINEX) 25 MG tablet Take 25 mg by mouth at bedtime.    . docusate sodium (COLACE) 100 MG capsule Take 100 mg by mouth daily as needed for mild constipation.     Marland Kitchen esomeprazole (NEXIUM) 40 MG capsule Take 1 capsule (40 mg total) by mouth every morning. 30 capsule 11  . ferrous sulfate 325 (65 FE) MG EC tablet Take 325 mg by mouth daily.    Marland Kitchen  furosemide (LASIX) 20 MG tablet Take 1 tablet (20 mg total) by mouth daily. 30 tablet 0  . hydroxyurea (HYDREA) 500 MG capsule TAKE TWO CAPSULES BY MOUTH ONCE DAILY 180 capsule 1  . losartan (COZAAR) 100 MG tablet Take 100 mg by mouth every evening.     Marland Kitchen oxyCODONE (OXY IR/ROXICODONE) 5 MG immediate release tablet Take 1 tablet (5 mg total) by mouth every 4 (four) hours as needed for moderate pain.  (Patient not taking: Reported on 09/18/2017) 10 tablet 0  . Simethicone (GAS RELIEF PO) Take 1 tablet by mouth daily.     . simvastatin (ZOCOR) 10 MG tablet Take 10 mg by mouth every evening.      No current facility-administered medications for this visit.     PHYSICAL EXAMINATION: ECOG PERFORMANCE STATUS: 1 - Symptomatic but completely ambulatory  Vitals:   09/24/17 0952  BP: 102/75  Pulse: 66  Resp: 20  Temp: 97.7 F (36.5 C)  SpO2: (!) 87%   Filed Weights   09/24/17 0952  Weight: 291 lb (132 kg)    GENERAL:alert, no distress and comfortable SKIN: skin color, texture, turgor are normal, no rashes or significant lesions EYES: normal, Conjunctiva are pink and non-injected, sclera clear OROPHARYNX:no exudate, no erythema and lips, buccal mucosa, and tongue normal  NECK: supple, thyroid normal size, non-tender, without nodularity LYMPH:  no palpable lymphadenopathy in the cervical, axillary or inguinal LUNGS: clear to auscultation and percussion with normal breathing effort HEART: regular rate & rhythm and no murmurs and no lower extremity edema ABDOMEN:abdomen soft, non-tender and normal bowel sounds MUSCULOSKELETAL:no cyanosis of digits and no clubbing  NEURO: alert & oriented x 3 with fluent speech, no focal motor/sensory deficits EXTREMITIES: No lower extremity edema  LABORATORY DATA:  I have reviewed the data as listed   Chemistry      Component Value Date/Time   NA 140 07/12/2017 0530   K 4.2 07/12/2017 0530   CL 104 07/12/2017 0530   CO2 30 07/12/2017 0530   BUN 25 (H) 07/12/2017 0530   CREATININE 0.91 07/12/2017 0530   CREATININE 0.76 06/16/2013 0715      Component Value Date/Time   CALCIUM 8.8 (L) 07/12/2017 0530   ALKPHOS 79 03/06/2017 1254   AST 20 03/06/2017 1254   ALT 17 03/06/2017 1254   BILITOT 0.8 03/06/2017 1254       Lab Results  Component Value Date   WBC 4.3 09/24/2017   HGB 13.2 09/24/2017   HCT 39.4 09/24/2017   MCV 115.0 (H)  09/24/2017   PLT 241 09/24/2017   NEUTROABS 2.5 09/24/2017    ASSESSMENT & PLAN:  Malignant neoplasm of upper-outer quadrant of left breast in female, estrogen receptor positive ([place]) 07/10/2017 Left lumpectomy: IDC grade 2, 1.3 cm, intermediate grade DCIS, margins negative, 0/2 lymph nodes negative, ER 5% positive weak staining, PR 0%, HER-2 negative ratio 1.73, Ki-67 15%, T1cN0 stage IA AJCC 8  Mammaprint: Low risk luminal type A, average 10-year risk of recurrence untreated 10% I counseled her extensively regarding the results of the Mammaprint and provided her with a copy of the test. Postoperative breast cellulitis: Treated and resolved with antibiotics  Recommendation: 1.  Adjuvant radiation therapy to start next week 2. followed by adjuvant antiestrogen therapy with anastrozole 1 mg daily times 5-7 years  Return to clinic at the end of radiation to start antiestrogen therapy  I spent 25 minutes talking to the patient of which more than half was spent in counseling  and coordination of care.  No orders of the defined types were placed in this encounter.  The patient has a good understanding of the overall plan. she agrees with it. she will call with any problems that may develop before the next visit here.   Rulon Eisenmenger, MD 09/24/17

## 2017-09-25 ENCOUNTER — Ambulatory Visit
Admission: RE | Admit: 2017-09-25 | Discharge: 2017-09-25 | Disposition: A | Discharge status: Home / Self Care | Payer: Medicare Other | Source: Ambulatory Visit | Attending: Radiation Oncology | Admitting: Radiation Oncology

## 2017-09-25 DIAGNOSIS — R011 Cardiac murmur, unspecified: Secondary | ICD-10-CM | POA: Diagnosis not present

## 2017-09-25 DIAGNOSIS — I10 Essential (primary) hypertension: Secondary | ICD-10-CM | POA: Diagnosis not present

## 2017-09-25 DIAGNOSIS — D649 Anemia, unspecified: Secondary | ICD-10-CM | POA: Diagnosis not present

## 2017-09-25 DIAGNOSIS — Z170 Estrogen receptor positive status [ER+]: Secondary | ICD-10-CM | POA: Diagnosis not present

## 2017-09-25 DIAGNOSIS — K219 Gastro-esophageal reflux disease without esophagitis: Secondary | ICD-10-CM | POA: Diagnosis not present

## 2017-09-25 DIAGNOSIS — C50412 Malignant neoplasm of upper-outer quadrant of left female breast: Secondary | ICD-10-CM | POA: Diagnosis not present

## 2017-09-25 DIAGNOSIS — K469 Unspecified abdominal hernia without obstruction or gangrene: Secondary | ICD-10-CM | POA: Diagnosis not present

## 2017-10-01 ENCOUNTER — Ambulatory Visit
Admission: RE | Admit: 2017-10-01 | Discharge: 2017-10-01 | Disposition: A | Discharge status: Home / Self Care | Payer: Medicare Other | Source: Ambulatory Visit | Attending: Radiation Oncology | Admitting: Radiation Oncology

## 2017-10-01 ENCOUNTER — Ambulatory Visit: Payer: Medicare Other | Admitting: Obstetrics and Gynecology

## 2017-10-01 DIAGNOSIS — C50412 Malignant neoplasm of upper-outer quadrant of left female breast: Secondary | ICD-10-CM | POA: Diagnosis not present

## 2017-10-01 DIAGNOSIS — R011 Cardiac murmur, unspecified: Secondary | ICD-10-CM | POA: Diagnosis not present

## 2017-10-01 DIAGNOSIS — I10 Essential (primary) hypertension: Secondary | ICD-10-CM | POA: Diagnosis not present

## 2017-10-01 DIAGNOSIS — D649 Anemia, unspecified: Secondary | ICD-10-CM | POA: Diagnosis not present

## 2017-10-01 DIAGNOSIS — K219 Gastro-esophageal reflux disease without esophagitis: Secondary | ICD-10-CM | POA: Diagnosis not present

## 2017-10-01 DIAGNOSIS — Z170 Estrogen receptor positive status [ER+]: Principal | ICD-10-CM

## 2017-10-01 DIAGNOSIS — K469 Unspecified abdominal hernia without obstruction or gangrene: Secondary | ICD-10-CM | POA: Diagnosis not present

## 2017-10-01 MED ORDER — RADIAPLEXRX EX GEL
Freq: Once | CUTANEOUS | Status: AC
  Administered 2017-10-01: 15:00:00 via TOPICAL

## 2017-10-01 MED ORDER — ALRA NON-METALLIC DEODORANT (RAD-ONC)
1.0000 | Freq: Once | TOPICAL | Status: AC
Start: 2017-10-01 — End: 2017-10-01
  Administered 2017-10-01: 1 via TOPICAL

## 2017-10-01 NOTE — Progress Notes (Signed)
Patient education done, my business card, Alra deodorant and radiaplex given, discussed side effects, skin irritation,pain, swelling, of breast, fatigue, apply radiaplex after rad tx and bedtime daily for moisturizing and soothing skin, alra after rad tx and prn, luke warm shower/bath,no rubbing,scrubbing, or scratching treated area, unscented soap such as dove,mild, pat dry, no under wire bra if possible, electric shaver only for axilla increase protein in diet, stay hydrated,drink plenty water, sees MD weekly and prn, teach back given

## 2017-10-02 ENCOUNTER — Ambulatory Visit: Payer: Medicare Other

## 2017-10-02 ENCOUNTER — Ambulatory Visit
Admission: RE | Admit: 2017-10-02 | Discharge: 2017-10-02 | Disposition: A | Discharge status: Home / Self Care | Payer: Medicare Other | Source: Ambulatory Visit | Attending: Radiation Oncology | Admitting: Radiation Oncology

## 2017-10-02 DIAGNOSIS — K469 Unspecified abdominal hernia without obstruction or gangrene: Secondary | ICD-10-CM | POA: Diagnosis not present

## 2017-10-02 DIAGNOSIS — D649 Anemia, unspecified: Secondary | ICD-10-CM | POA: Diagnosis not present

## 2017-10-02 DIAGNOSIS — C50412 Malignant neoplasm of upper-outer quadrant of left female breast: Secondary | ICD-10-CM | POA: Diagnosis not present

## 2017-10-02 DIAGNOSIS — I10 Essential (primary) hypertension: Secondary | ICD-10-CM | POA: Diagnosis not present

## 2017-10-02 DIAGNOSIS — K219 Gastro-esophageal reflux disease without esophagitis: Secondary | ICD-10-CM | POA: Diagnosis not present

## 2017-10-02 DIAGNOSIS — Z170 Estrogen receptor positive status [ER+]: Secondary | ICD-10-CM | POA: Diagnosis not present

## 2017-10-02 DIAGNOSIS — R011 Cardiac murmur, unspecified: Secondary | ICD-10-CM | POA: Diagnosis not present

## 2017-10-03 ENCOUNTER — Ambulatory Visit
Admission: RE | Admit: 2017-10-03 | Discharge: 2017-10-03 | Disposition: A | Discharge status: Home / Self Care | Payer: Medicare Other | Source: Ambulatory Visit | Attending: Radiation Oncology | Admitting: Radiation Oncology

## 2017-10-03 DIAGNOSIS — D649 Anemia, unspecified: Secondary | ICD-10-CM | POA: Diagnosis not present

## 2017-10-03 DIAGNOSIS — R011 Cardiac murmur, unspecified: Secondary | ICD-10-CM | POA: Diagnosis not present

## 2017-10-03 DIAGNOSIS — K219 Gastro-esophageal reflux disease without esophagitis: Secondary | ICD-10-CM | POA: Diagnosis not present

## 2017-10-03 DIAGNOSIS — Z170 Estrogen receptor positive status [ER+]: Secondary | ICD-10-CM | POA: Diagnosis not present

## 2017-10-03 DIAGNOSIS — I10 Essential (primary) hypertension: Secondary | ICD-10-CM | POA: Diagnosis not present

## 2017-10-03 DIAGNOSIS — K469 Unspecified abdominal hernia without obstruction or gangrene: Secondary | ICD-10-CM | POA: Diagnosis not present

## 2017-10-03 DIAGNOSIS — C50412 Malignant neoplasm of upper-outer quadrant of left female breast: Secondary | ICD-10-CM | POA: Diagnosis not present

## 2017-10-04 ENCOUNTER — Ambulatory Visit
Admission: RE | Admit: 2017-10-04 | Discharge: 2017-10-04 | Disposition: A | Discharge status: Home / Self Care | Payer: Medicare Other | Source: Ambulatory Visit | Attending: Radiation Oncology | Admitting: Radiation Oncology

## 2017-10-04 DIAGNOSIS — K219 Gastro-esophageal reflux disease without esophagitis: Secondary | ICD-10-CM | POA: Diagnosis not present

## 2017-10-04 DIAGNOSIS — C50412 Malignant neoplasm of upper-outer quadrant of left female breast: Secondary | ICD-10-CM | POA: Diagnosis not present

## 2017-10-04 DIAGNOSIS — Z170 Estrogen receptor positive status [ER+]: Secondary | ICD-10-CM | POA: Diagnosis not present

## 2017-10-04 DIAGNOSIS — D649 Anemia, unspecified: Secondary | ICD-10-CM | POA: Diagnosis not present

## 2017-10-04 DIAGNOSIS — I10 Essential (primary) hypertension: Secondary | ICD-10-CM | POA: Diagnosis not present

## 2017-10-04 DIAGNOSIS — K469 Unspecified abdominal hernia without obstruction or gangrene: Secondary | ICD-10-CM | POA: Diagnosis not present

## 2017-10-04 DIAGNOSIS — R011 Cardiac murmur, unspecified: Secondary | ICD-10-CM | POA: Diagnosis not present

## 2017-10-05 ENCOUNTER — Ambulatory Visit
Admission: RE | Admit: 2017-10-05 | Discharge: 2017-10-05 | Disposition: A | Discharge status: Home / Self Care | Payer: Medicare Other | Source: Ambulatory Visit | Attending: Radiation Oncology | Admitting: Radiation Oncology

## 2017-10-05 DIAGNOSIS — Z170 Estrogen receptor positive status [ER+]: Secondary | ICD-10-CM | POA: Diagnosis not present

## 2017-10-05 DIAGNOSIS — D649 Anemia, unspecified: Secondary | ICD-10-CM | POA: Diagnosis not present

## 2017-10-05 DIAGNOSIS — R011 Cardiac murmur, unspecified: Secondary | ICD-10-CM | POA: Diagnosis not present

## 2017-10-05 DIAGNOSIS — C50412 Malignant neoplasm of upper-outer quadrant of left female breast: Secondary | ICD-10-CM | POA: Diagnosis not present

## 2017-10-05 DIAGNOSIS — I10 Essential (primary) hypertension: Secondary | ICD-10-CM | POA: Diagnosis not present

## 2017-10-05 DIAGNOSIS — K469 Unspecified abdominal hernia without obstruction or gangrene: Secondary | ICD-10-CM | POA: Diagnosis not present

## 2017-10-05 DIAGNOSIS — K219 Gastro-esophageal reflux disease without esophagitis: Secondary | ICD-10-CM | POA: Diagnosis not present

## 2017-10-08 ENCOUNTER — Ambulatory Visit
Admission: RE | Admit: 2017-10-08 | Discharge: 2017-10-08 | Disposition: A | Discharge status: Home / Self Care | Payer: Medicare Other | Source: Ambulatory Visit | Attending: Radiation Oncology | Admitting: Radiation Oncology

## 2017-10-08 DIAGNOSIS — I10 Essential (primary) hypertension: Secondary | ICD-10-CM | POA: Diagnosis not present

## 2017-10-08 DIAGNOSIS — K469 Unspecified abdominal hernia without obstruction or gangrene: Secondary | ICD-10-CM | POA: Diagnosis not present

## 2017-10-08 DIAGNOSIS — K219 Gastro-esophageal reflux disease without esophagitis: Secondary | ICD-10-CM | POA: Diagnosis not present

## 2017-10-08 DIAGNOSIS — Z170 Estrogen receptor positive status [ER+]: Secondary | ICD-10-CM | POA: Diagnosis not present

## 2017-10-08 DIAGNOSIS — R011 Cardiac murmur, unspecified: Secondary | ICD-10-CM | POA: Diagnosis not present

## 2017-10-08 DIAGNOSIS — C50412 Malignant neoplasm of upper-outer quadrant of left female breast: Secondary | ICD-10-CM | POA: Diagnosis not present

## 2017-10-08 DIAGNOSIS — D649 Anemia, unspecified: Secondary | ICD-10-CM | POA: Diagnosis not present

## 2017-10-09 ENCOUNTER — Ambulatory Visit
Admission: RE | Admit: 2017-10-09 | Discharge: 2017-10-09 | Disposition: A | Discharge status: Home / Self Care | Payer: Medicare Other | Source: Ambulatory Visit | Attending: Radiation Oncology | Admitting: Radiation Oncology

## 2017-10-09 DIAGNOSIS — R011 Cardiac murmur, unspecified: Secondary | ICD-10-CM | POA: Diagnosis not present

## 2017-10-09 DIAGNOSIS — K219 Gastro-esophageal reflux disease without esophagitis: Secondary | ICD-10-CM | POA: Diagnosis not present

## 2017-10-09 DIAGNOSIS — D649 Anemia, unspecified: Secondary | ICD-10-CM | POA: Diagnosis not present

## 2017-10-09 DIAGNOSIS — K469 Unspecified abdominal hernia without obstruction or gangrene: Secondary | ICD-10-CM | POA: Diagnosis not present

## 2017-10-09 DIAGNOSIS — I10 Essential (primary) hypertension: Secondary | ICD-10-CM | POA: Diagnosis not present

## 2017-10-09 DIAGNOSIS — C50412 Malignant neoplasm of upper-outer quadrant of left female breast: Secondary | ICD-10-CM | POA: Diagnosis not present

## 2017-10-09 DIAGNOSIS — Z170 Estrogen receptor positive status [ER+]: Secondary | ICD-10-CM | POA: Diagnosis not present

## 2017-10-10 ENCOUNTER — Ambulatory Visit
Admission: RE | Admit: 2017-10-10 | Discharge: 2017-10-10 | Disposition: A | Discharge status: Home / Self Care | Payer: Medicare Other | Source: Ambulatory Visit | Attending: Radiation Oncology | Admitting: Radiation Oncology

## 2017-10-10 DIAGNOSIS — R011 Cardiac murmur, unspecified: Secondary | ICD-10-CM | POA: Diagnosis not present

## 2017-10-10 DIAGNOSIS — Z170 Estrogen receptor positive status [ER+]: Secondary | ICD-10-CM | POA: Diagnosis not present

## 2017-10-10 DIAGNOSIS — K219 Gastro-esophageal reflux disease without esophagitis: Secondary | ICD-10-CM | POA: Diagnosis not present

## 2017-10-10 DIAGNOSIS — D649 Anemia, unspecified: Secondary | ICD-10-CM | POA: Diagnosis not present

## 2017-10-10 DIAGNOSIS — C50412 Malignant neoplasm of upper-outer quadrant of left female breast: Secondary | ICD-10-CM | POA: Diagnosis not present

## 2017-10-10 DIAGNOSIS — I10 Essential (primary) hypertension: Secondary | ICD-10-CM | POA: Diagnosis not present

## 2017-10-10 DIAGNOSIS — K469 Unspecified abdominal hernia without obstruction or gangrene: Secondary | ICD-10-CM | POA: Diagnosis not present

## 2017-10-11 ENCOUNTER — Ambulatory Visit
Admission: RE | Admit: 2017-10-11 | Discharge: 2017-10-11 | Disposition: A | Discharge status: Home / Self Care | Payer: Medicare Other | Source: Ambulatory Visit | Attending: Radiation Oncology | Admitting: Radiation Oncology

## 2017-10-11 DIAGNOSIS — D649 Anemia, unspecified: Secondary | ICD-10-CM | POA: Diagnosis not present

## 2017-10-11 DIAGNOSIS — Z170 Estrogen receptor positive status [ER+]: Secondary | ICD-10-CM | POA: Diagnosis not present

## 2017-10-11 DIAGNOSIS — I10 Essential (primary) hypertension: Secondary | ICD-10-CM | POA: Diagnosis not present

## 2017-10-11 DIAGNOSIS — K219 Gastro-esophageal reflux disease without esophagitis: Secondary | ICD-10-CM | POA: Diagnosis not present

## 2017-10-11 DIAGNOSIS — K469 Unspecified abdominal hernia without obstruction or gangrene: Secondary | ICD-10-CM | POA: Diagnosis not present

## 2017-10-11 DIAGNOSIS — C50412 Malignant neoplasm of upper-outer quadrant of left female breast: Secondary | ICD-10-CM | POA: Diagnosis not present

## 2017-10-11 DIAGNOSIS — R011 Cardiac murmur, unspecified: Secondary | ICD-10-CM | POA: Diagnosis not present

## 2017-10-12 ENCOUNTER — Ambulatory Visit
Admission: RE | Admit: 2017-10-12 | Discharge: 2017-10-12 | Disposition: A | Discharge status: Home / Self Care | Payer: Medicare Other | Source: Ambulatory Visit | Attending: Radiation Oncology | Admitting: Radiation Oncology

## 2017-10-12 DIAGNOSIS — Z170 Estrogen receptor positive status [ER+]: Secondary | ICD-10-CM | POA: Diagnosis not present

## 2017-10-12 DIAGNOSIS — R011 Cardiac murmur, unspecified: Secondary | ICD-10-CM | POA: Diagnosis not present

## 2017-10-12 DIAGNOSIS — D649 Anemia, unspecified: Secondary | ICD-10-CM | POA: Diagnosis not present

## 2017-10-12 DIAGNOSIS — K219 Gastro-esophageal reflux disease without esophagitis: Secondary | ICD-10-CM | POA: Diagnosis not present

## 2017-10-12 DIAGNOSIS — I10 Essential (primary) hypertension: Secondary | ICD-10-CM | POA: Diagnosis not present

## 2017-10-12 DIAGNOSIS — C50412 Malignant neoplasm of upper-outer quadrant of left female breast: Secondary | ICD-10-CM | POA: Diagnosis not present

## 2017-10-12 DIAGNOSIS — K469 Unspecified abdominal hernia without obstruction or gangrene: Secondary | ICD-10-CM | POA: Diagnosis not present

## 2017-10-15 ENCOUNTER — Ambulatory Visit: Payer: Medicare Other

## 2017-10-16 ENCOUNTER — Ambulatory Visit
Admission: RE | Admit: 2017-10-16 | Discharge: 2017-10-16 | Disposition: A | Discharge status: Home / Self Care | Payer: Medicare Other | Source: Ambulatory Visit | Attending: Radiation Oncology | Admitting: Radiation Oncology

## 2017-10-16 DIAGNOSIS — C50412 Malignant neoplasm of upper-outer quadrant of left female breast: Secondary | ICD-10-CM | POA: Diagnosis not present

## 2017-10-16 DIAGNOSIS — K469 Unspecified abdominal hernia without obstruction or gangrene: Secondary | ICD-10-CM | POA: Diagnosis not present

## 2017-10-16 DIAGNOSIS — D649 Anemia, unspecified: Secondary | ICD-10-CM | POA: Diagnosis not present

## 2017-10-16 DIAGNOSIS — R011 Cardiac murmur, unspecified: Secondary | ICD-10-CM | POA: Diagnosis not present

## 2017-10-16 DIAGNOSIS — Z170 Estrogen receptor positive status [ER+]: Secondary | ICD-10-CM | POA: Diagnosis not present

## 2017-10-16 DIAGNOSIS — I10 Essential (primary) hypertension: Secondary | ICD-10-CM | POA: Diagnosis not present

## 2017-10-16 DIAGNOSIS — K219 Gastro-esophageal reflux disease without esophagitis: Secondary | ICD-10-CM | POA: Diagnosis not present

## 2017-10-17 ENCOUNTER — Ambulatory Visit
Admission: RE | Admit: 2017-10-17 | Discharge: 2017-10-17 | Disposition: A | Discharge status: Home / Self Care | Payer: Medicare Other | Source: Ambulatory Visit | Attending: Radiation Oncology | Admitting: Radiation Oncology

## 2017-10-17 DIAGNOSIS — C50412 Malignant neoplasm of upper-outer quadrant of left female breast: Secondary | ICD-10-CM | POA: Diagnosis not present

## 2017-10-17 DIAGNOSIS — K469 Unspecified abdominal hernia without obstruction or gangrene: Secondary | ICD-10-CM | POA: Diagnosis not present

## 2017-10-17 DIAGNOSIS — I10 Essential (primary) hypertension: Secondary | ICD-10-CM | POA: Diagnosis not present

## 2017-10-17 DIAGNOSIS — K219 Gastro-esophageal reflux disease without esophagitis: Secondary | ICD-10-CM | POA: Diagnosis not present

## 2017-10-17 DIAGNOSIS — Z170 Estrogen receptor positive status [ER+]: Secondary | ICD-10-CM | POA: Diagnosis not present

## 2017-10-17 DIAGNOSIS — R011 Cardiac murmur, unspecified: Secondary | ICD-10-CM | POA: Diagnosis not present

## 2017-10-17 DIAGNOSIS — D649 Anemia, unspecified: Secondary | ICD-10-CM | POA: Diagnosis not present

## 2017-10-18 ENCOUNTER — Ambulatory Visit
Admission: RE | Admit: 2017-10-18 | Discharge: 2017-10-18 | Disposition: A | Discharge status: Home / Self Care | Payer: Medicare Other | Source: Ambulatory Visit | Attending: Radiation Oncology | Admitting: Radiation Oncology

## 2017-10-18 DIAGNOSIS — D649 Anemia, unspecified: Secondary | ICD-10-CM | POA: Diagnosis not present

## 2017-10-18 DIAGNOSIS — Z170 Estrogen receptor positive status [ER+]: Secondary | ICD-10-CM | POA: Diagnosis not present

## 2017-10-18 DIAGNOSIS — K219 Gastro-esophageal reflux disease without esophagitis: Secondary | ICD-10-CM | POA: Diagnosis not present

## 2017-10-18 DIAGNOSIS — K469 Unspecified abdominal hernia without obstruction or gangrene: Secondary | ICD-10-CM | POA: Diagnosis not present

## 2017-10-18 DIAGNOSIS — R011 Cardiac murmur, unspecified: Secondary | ICD-10-CM | POA: Diagnosis not present

## 2017-10-18 DIAGNOSIS — C50412 Malignant neoplasm of upper-outer quadrant of left female breast: Secondary | ICD-10-CM | POA: Diagnosis not present

## 2017-10-18 DIAGNOSIS — I10 Essential (primary) hypertension: Secondary | ICD-10-CM | POA: Diagnosis not present

## 2017-10-19 ENCOUNTER — Ambulatory Visit
Admission: RE | Admit: 2017-10-19 | Discharge: 2017-10-19 | Disposition: A | Discharge status: Home / Self Care | Payer: Medicare Other | Source: Ambulatory Visit | Attending: Radiation Oncology | Admitting: Radiation Oncology

## 2017-10-19 DIAGNOSIS — Z170 Estrogen receptor positive status [ER+]: Secondary | ICD-10-CM | POA: Diagnosis not present

## 2017-10-19 DIAGNOSIS — R011 Cardiac murmur, unspecified: Secondary | ICD-10-CM | POA: Diagnosis not present

## 2017-10-19 DIAGNOSIS — C50412 Malignant neoplasm of upper-outer quadrant of left female breast: Secondary | ICD-10-CM | POA: Diagnosis not present

## 2017-10-19 DIAGNOSIS — D649 Anemia, unspecified: Secondary | ICD-10-CM | POA: Diagnosis not present

## 2017-10-19 DIAGNOSIS — I10 Essential (primary) hypertension: Secondary | ICD-10-CM | POA: Diagnosis not present

## 2017-10-19 DIAGNOSIS — K469 Unspecified abdominal hernia without obstruction or gangrene: Secondary | ICD-10-CM | POA: Diagnosis not present

## 2017-10-19 DIAGNOSIS — K219 Gastro-esophageal reflux disease without esophagitis: Secondary | ICD-10-CM | POA: Diagnosis not present

## 2017-10-20 ENCOUNTER — Ambulatory Visit
Admission: RE | Admit: 2017-10-20 | Discharge: 2017-10-20 | Disposition: A | Discharge status: Home / Self Care | Payer: Medicare Other | Source: Ambulatory Visit | Attending: Radiation Oncology | Admitting: Radiation Oncology

## 2017-10-20 DIAGNOSIS — Z170 Estrogen receptor positive status [ER+]: Secondary | ICD-10-CM | POA: Diagnosis not present

## 2017-10-20 DIAGNOSIS — C50412 Malignant neoplasm of upper-outer quadrant of left female breast: Secondary | ICD-10-CM | POA: Diagnosis not present

## 2017-10-20 DIAGNOSIS — D649 Anemia, unspecified: Secondary | ICD-10-CM | POA: Diagnosis not present

## 2017-10-20 DIAGNOSIS — K469 Unspecified abdominal hernia without obstruction or gangrene: Secondary | ICD-10-CM | POA: Diagnosis not present

## 2017-10-20 DIAGNOSIS — K219 Gastro-esophageal reflux disease without esophagitis: Secondary | ICD-10-CM | POA: Diagnosis not present

## 2017-10-20 DIAGNOSIS — R011 Cardiac murmur, unspecified: Secondary | ICD-10-CM | POA: Diagnosis not present

## 2017-10-20 DIAGNOSIS — I10 Essential (primary) hypertension: Secondary | ICD-10-CM | POA: Diagnosis not present

## 2017-10-22 ENCOUNTER — Telehealth: Payer: Self-pay | Admitting: Hematology and Oncology

## 2017-10-22 ENCOUNTER — Ambulatory Visit
Admission: RE | Admit: 2017-10-22 | Discharge: 2017-10-22 | Disposition: A | Discharge status: Home / Self Care | Payer: Medicare Other | Source: Ambulatory Visit | Attending: Radiation Oncology | Admitting: Radiation Oncology

## 2017-10-22 DIAGNOSIS — K469 Unspecified abdominal hernia without obstruction or gangrene: Secondary | ICD-10-CM | POA: Diagnosis not present

## 2017-10-22 DIAGNOSIS — I10 Essential (primary) hypertension: Secondary | ICD-10-CM | POA: Diagnosis not present

## 2017-10-22 DIAGNOSIS — D649 Anemia, unspecified: Secondary | ICD-10-CM | POA: Diagnosis not present

## 2017-10-22 DIAGNOSIS — R011 Cardiac murmur, unspecified: Secondary | ICD-10-CM | POA: Diagnosis not present

## 2017-10-22 DIAGNOSIS — Z170 Estrogen receptor positive status [ER+]: Secondary | ICD-10-CM | POA: Diagnosis not present

## 2017-10-22 DIAGNOSIS — C50412 Malignant neoplasm of upper-outer quadrant of left female breast: Secondary | ICD-10-CM | POA: Diagnosis not present

## 2017-10-22 DIAGNOSIS — K219 Gastro-esophageal reflux disease without esophagitis: Secondary | ICD-10-CM | POA: Diagnosis not present

## 2017-10-22 NOTE — Telephone Encounter (Signed)
Scheduled appt per 12/17 sch message - patient is  Aware of appt date and time.

## 2017-10-23 ENCOUNTER — Ambulatory Visit
Admission: RE | Admit: 2017-10-23 | Discharge: 2017-10-23 | Disposition: A | Discharge status: Home / Self Care | Payer: Medicare Other | Source: Ambulatory Visit | Attending: Radiation Oncology | Admitting: Radiation Oncology

## 2017-10-23 DIAGNOSIS — C50412 Malignant neoplasm of upper-outer quadrant of left female breast: Secondary | ICD-10-CM | POA: Diagnosis not present

## 2017-10-23 DIAGNOSIS — D649 Anemia, unspecified: Secondary | ICD-10-CM | POA: Diagnosis not present

## 2017-10-23 DIAGNOSIS — K219 Gastro-esophageal reflux disease without esophagitis: Secondary | ICD-10-CM | POA: Diagnosis not present

## 2017-10-23 DIAGNOSIS — I10 Essential (primary) hypertension: Secondary | ICD-10-CM | POA: Diagnosis not present

## 2017-10-23 DIAGNOSIS — K469 Unspecified abdominal hernia without obstruction or gangrene: Secondary | ICD-10-CM | POA: Diagnosis not present

## 2017-10-23 DIAGNOSIS — Z170 Estrogen receptor positive status [ER+]: Secondary | ICD-10-CM | POA: Diagnosis not present

## 2017-10-23 DIAGNOSIS — R011 Cardiac murmur, unspecified: Secondary | ICD-10-CM | POA: Diagnosis not present

## 2017-10-24 ENCOUNTER — Ambulatory Visit
Admission: RE | Admit: 2017-10-24 | Discharge: 2017-10-24 | Disposition: A | Discharge status: Home / Self Care | Payer: Medicare Other | Source: Ambulatory Visit | Attending: Radiation Oncology | Admitting: Radiation Oncology

## 2017-10-24 DIAGNOSIS — R011 Cardiac murmur, unspecified: Secondary | ICD-10-CM | POA: Diagnosis not present

## 2017-10-24 DIAGNOSIS — I10 Essential (primary) hypertension: Secondary | ICD-10-CM | POA: Diagnosis not present

## 2017-10-24 DIAGNOSIS — Z170 Estrogen receptor positive status [ER+]: Secondary | ICD-10-CM | POA: Diagnosis not present

## 2017-10-24 DIAGNOSIS — C50412 Malignant neoplasm of upper-outer quadrant of left female breast: Secondary | ICD-10-CM | POA: Diagnosis not present

## 2017-10-24 DIAGNOSIS — D649 Anemia, unspecified: Secondary | ICD-10-CM | POA: Diagnosis not present

## 2017-10-24 DIAGNOSIS — K219 Gastro-esophageal reflux disease without esophagitis: Secondary | ICD-10-CM | POA: Diagnosis not present

## 2017-10-24 DIAGNOSIS — K469 Unspecified abdominal hernia without obstruction or gangrene: Secondary | ICD-10-CM | POA: Diagnosis not present

## 2017-10-25 ENCOUNTER — Ambulatory Visit
Admission: RE | Admit: 2017-10-25 | Discharge: 2017-10-25 | Disposition: A | Discharge status: Home / Self Care | Payer: Medicare Other | Source: Ambulatory Visit | Attending: Radiation Oncology | Admitting: Radiation Oncology

## 2017-10-25 DIAGNOSIS — C50412 Malignant neoplasm of upper-outer quadrant of left female breast: Secondary | ICD-10-CM | POA: Diagnosis not present

## 2017-10-25 DIAGNOSIS — I10 Essential (primary) hypertension: Secondary | ICD-10-CM | POA: Diagnosis not present

## 2017-10-25 DIAGNOSIS — D649 Anemia, unspecified: Secondary | ICD-10-CM | POA: Diagnosis not present

## 2017-10-25 DIAGNOSIS — K219 Gastro-esophageal reflux disease without esophagitis: Secondary | ICD-10-CM | POA: Diagnosis not present

## 2017-10-25 DIAGNOSIS — R011 Cardiac murmur, unspecified: Secondary | ICD-10-CM | POA: Diagnosis not present

## 2017-10-25 DIAGNOSIS — Z170 Estrogen receptor positive status [ER+]: Secondary | ICD-10-CM | POA: Diagnosis not present

## 2017-10-25 DIAGNOSIS — K469 Unspecified abdominal hernia without obstruction or gangrene: Secondary | ICD-10-CM | POA: Diagnosis not present

## 2017-10-26 ENCOUNTER — Ambulatory Visit: Payer: Medicare Other

## 2017-10-29 ENCOUNTER — Ambulatory Visit
Admission: RE | Admit: 2017-10-29 | Discharge: 2017-10-29 | Disposition: A | Discharge status: Home / Self Care | Payer: Medicare Other | Source: Ambulatory Visit | Attending: Radiation Oncology | Admitting: Radiation Oncology

## 2017-10-29 DIAGNOSIS — D649 Anemia, unspecified: Secondary | ICD-10-CM | POA: Diagnosis not present

## 2017-10-29 DIAGNOSIS — R011 Cardiac murmur, unspecified: Secondary | ICD-10-CM | POA: Diagnosis not present

## 2017-10-29 DIAGNOSIS — K469 Unspecified abdominal hernia without obstruction or gangrene: Secondary | ICD-10-CM | POA: Diagnosis not present

## 2017-10-29 DIAGNOSIS — I10 Essential (primary) hypertension: Secondary | ICD-10-CM | POA: Diagnosis not present

## 2017-10-29 DIAGNOSIS — K219 Gastro-esophageal reflux disease without esophagitis: Secondary | ICD-10-CM | POA: Diagnosis not present

## 2017-10-29 DIAGNOSIS — C50412 Malignant neoplasm of upper-outer quadrant of left female breast: Secondary | ICD-10-CM | POA: Diagnosis not present

## 2017-10-29 DIAGNOSIS — Z170 Estrogen receptor positive status [ER+]: Secondary | ICD-10-CM | POA: Diagnosis not present

## 2017-10-31 ENCOUNTER — Ambulatory Visit
Admission: RE | Admit: 2017-10-31 | Discharge: 2017-10-31 | Disposition: A | Discharge status: Home / Self Care | Payer: Medicare Other | Source: Ambulatory Visit | Attending: Radiation Oncology | Admitting: Radiation Oncology

## 2017-10-31 DIAGNOSIS — C50412 Malignant neoplasm of upper-outer quadrant of left female breast: Secondary | ICD-10-CM | POA: Diagnosis not present

## 2017-10-31 DIAGNOSIS — D649 Anemia, unspecified: Secondary | ICD-10-CM | POA: Diagnosis not present

## 2017-10-31 DIAGNOSIS — Z170 Estrogen receptor positive status [ER+]: Secondary | ICD-10-CM | POA: Diagnosis not present

## 2017-10-31 DIAGNOSIS — I10 Essential (primary) hypertension: Secondary | ICD-10-CM | POA: Diagnosis not present

## 2017-10-31 DIAGNOSIS — K219 Gastro-esophageal reflux disease without esophagitis: Secondary | ICD-10-CM | POA: Diagnosis not present

## 2017-10-31 DIAGNOSIS — K469 Unspecified abdominal hernia without obstruction or gangrene: Secondary | ICD-10-CM | POA: Diagnosis not present

## 2017-10-31 DIAGNOSIS — R011 Cardiac murmur, unspecified: Secondary | ICD-10-CM | POA: Diagnosis not present

## 2017-11-01 ENCOUNTER — Ambulatory Visit
Admission: RE | Admit: 2017-11-01 | Discharge: 2017-11-01 | Disposition: A | Discharge status: Home / Self Care | Payer: Medicare Other | Source: Ambulatory Visit | Attending: Radiation Oncology | Admitting: Radiation Oncology

## 2017-11-01 DIAGNOSIS — I10 Essential (primary) hypertension: Secondary | ICD-10-CM | POA: Diagnosis not present

## 2017-11-01 DIAGNOSIS — R011 Cardiac murmur, unspecified: Secondary | ICD-10-CM | POA: Diagnosis not present

## 2017-11-01 DIAGNOSIS — C50412 Malignant neoplasm of upper-outer quadrant of left female breast: Secondary | ICD-10-CM | POA: Diagnosis not present

## 2017-11-01 DIAGNOSIS — D649 Anemia, unspecified: Secondary | ICD-10-CM | POA: Diagnosis not present

## 2017-11-01 DIAGNOSIS — K469 Unspecified abdominal hernia without obstruction or gangrene: Secondary | ICD-10-CM | POA: Diagnosis not present

## 2017-11-01 DIAGNOSIS — Z170 Estrogen receptor positive status [ER+]: Secondary | ICD-10-CM | POA: Diagnosis not present

## 2017-11-01 DIAGNOSIS — K219 Gastro-esophageal reflux disease without esophagitis: Secondary | ICD-10-CM | POA: Diagnosis not present

## 2017-11-02 ENCOUNTER — Ambulatory Visit
Admission: RE | Admit: 2017-11-02 | Discharge: 2017-11-02 | Disposition: A | Discharge status: Home / Self Care | Payer: Medicare Other | Source: Ambulatory Visit | Attending: Radiation Oncology | Admitting: Radiation Oncology

## 2017-11-02 DIAGNOSIS — Z170 Estrogen receptor positive status [ER+]: Secondary | ICD-10-CM | POA: Diagnosis not present

## 2017-11-02 DIAGNOSIS — C50412 Malignant neoplasm of upper-outer quadrant of left female breast: Secondary | ICD-10-CM | POA: Diagnosis not present

## 2017-11-02 DIAGNOSIS — I10 Essential (primary) hypertension: Secondary | ICD-10-CM | POA: Diagnosis not present

## 2017-11-02 DIAGNOSIS — K219 Gastro-esophageal reflux disease without esophagitis: Secondary | ICD-10-CM | POA: Diagnosis not present

## 2017-11-02 DIAGNOSIS — D649 Anemia, unspecified: Secondary | ICD-10-CM | POA: Diagnosis not present

## 2017-11-02 DIAGNOSIS — R011 Cardiac murmur, unspecified: Secondary | ICD-10-CM | POA: Diagnosis not present

## 2017-11-02 DIAGNOSIS — K469 Unspecified abdominal hernia without obstruction or gangrene: Secondary | ICD-10-CM | POA: Diagnosis not present

## 2017-11-05 ENCOUNTER — Ambulatory Visit
Admission: RE | Admit: 2017-11-05 | Discharge: 2017-11-05 | Disposition: A | Discharge status: Home / Self Care | Payer: Medicare Other | Source: Ambulatory Visit | Attending: Radiation Oncology | Admitting: Radiation Oncology

## 2017-11-05 DIAGNOSIS — K469 Unspecified abdominal hernia without obstruction or gangrene: Secondary | ICD-10-CM | POA: Diagnosis not present

## 2017-11-05 DIAGNOSIS — C50412 Malignant neoplasm of upper-outer quadrant of left female breast: Secondary | ICD-10-CM | POA: Diagnosis not present

## 2017-11-05 DIAGNOSIS — I10 Essential (primary) hypertension: Secondary | ICD-10-CM | POA: Diagnosis not present

## 2017-11-05 DIAGNOSIS — D649 Anemia, unspecified: Secondary | ICD-10-CM | POA: Diagnosis not present

## 2017-11-05 DIAGNOSIS — R011 Cardiac murmur, unspecified: Secondary | ICD-10-CM | POA: Diagnosis not present

## 2017-11-05 DIAGNOSIS — Z170 Estrogen receptor positive status [ER+]: Secondary | ICD-10-CM | POA: Diagnosis not present

## 2017-11-05 DIAGNOSIS — K219 Gastro-esophageal reflux disease without esophagitis: Secondary | ICD-10-CM | POA: Diagnosis not present

## 2017-11-07 ENCOUNTER — Ambulatory Visit
Admission: RE | Admit: 2017-11-07 | Discharge: 2017-11-07 | Disposition: A | Discharge status: Home / Self Care | Payer: Medicare Other | Source: Ambulatory Visit | Attending: Radiation Oncology | Admitting: Radiation Oncology

## 2017-11-07 DIAGNOSIS — I10 Essential (primary) hypertension: Secondary | ICD-10-CM | POA: Diagnosis not present

## 2017-11-07 DIAGNOSIS — Z170 Estrogen receptor positive status [ER+]: Secondary | ICD-10-CM | POA: Diagnosis not present

## 2017-11-07 DIAGNOSIS — K219 Gastro-esophageal reflux disease without esophagitis: Secondary | ICD-10-CM | POA: Diagnosis not present

## 2017-11-07 DIAGNOSIS — C50412 Malignant neoplasm of upper-outer quadrant of left female breast: Secondary | ICD-10-CM | POA: Diagnosis not present

## 2017-11-07 DIAGNOSIS — R011 Cardiac murmur, unspecified: Secondary | ICD-10-CM | POA: Diagnosis not present

## 2017-11-07 DIAGNOSIS — D649 Anemia, unspecified: Secondary | ICD-10-CM | POA: Diagnosis not present

## 2017-11-07 DIAGNOSIS — K469 Unspecified abdominal hernia without obstruction or gangrene: Secondary | ICD-10-CM | POA: Diagnosis not present

## 2017-11-08 ENCOUNTER — Ambulatory Visit
Admission: RE | Admit: 2017-11-08 | Discharge: 2017-11-08 | Disposition: A | Discharge status: Home / Self Care | Payer: Medicare Other | Source: Ambulatory Visit | Attending: Radiation Oncology | Admitting: Radiation Oncology

## 2017-11-08 DIAGNOSIS — K469 Unspecified abdominal hernia without obstruction or gangrene: Secondary | ICD-10-CM | POA: Diagnosis not present

## 2017-11-08 DIAGNOSIS — K219 Gastro-esophageal reflux disease without esophagitis: Secondary | ICD-10-CM | POA: Diagnosis not present

## 2017-11-08 DIAGNOSIS — Z170 Estrogen receptor positive status [ER+]: Secondary | ICD-10-CM | POA: Diagnosis not present

## 2017-11-08 DIAGNOSIS — C50412 Malignant neoplasm of upper-outer quadrant of left female breast: Secondary | ICD-10-CM | POA: Diagnosis not present

## 2017-11-08 DIAGNOSIS — D649 Anemia, unspecified: Secondary | ICD-10-CM | POA: Diagnosis not present

## 2017-11-08 DIAGNOSIS — R011 Cardiac murmur, unspecified: Secondary | ICD-10-CM | POA: Diagnosis not present

## 2017-11-08 DIAGNOSIS — I10 Essential (primary) hypertension: Secondary | ICD-10-CM | POA: Diagnosis not present

## 2017-11-09 ENCOUNTER — Encounter (HOSPITAL_COMMUNITY): Payer: Self-pay | Admitting: Emergency Medicine

## 2017-11-09 ENCOUNTER — Emergency Department (HOSPITAL_COMMUNITY): Payer: Medicare Other

## 2017-11-09 ENCOUNTER — Ambulatory Visit: Payer: Medicare Other | Admitting: Radiation Oncology

## 2017-11-09 ENCOUNTER — Inpatient Hospital Stay (HOSPITAL_COMMUNITY)
Admission: EM | Admit: 2017-11-09 | Discharge: 2017-11-12 | DRG: 193 | Disposition: A | Discharge status: Home Health | Payer: Medicare Other | Attending: Internal Medicine | Admitting: Internal Medicine

## 2017-11-09 ENCOUNTER — Ambulatory Visit: Payer: Medicare Other

## 2017-11-09 DIAGNOSIS — Z79899 Other long term (current) drug therapy: Secondary | ICD-10-CM

## 2017-11-09 DIAGNOSIS — Z882 Allergy status to sulfonamides status: Secondary | ICD-10-CM | POA: Diagnosis not present

## 2017-11-09 DIAGNOSIS — Z170 Estrogen receptor positive status [ER+]: Secondary | ICD-10-CM

## 2017-11-09 DIAGNOSIS — Z7982 Long term (current) use of aspirin: Secondary | ICD-10-CM

## 2017-11-09 DIAGNOSIS — Z8542 Personal history of malignant neoplasm of other parts of uterus: Secondary | ICD-10-CM | POA: Diagnosis not present

## 2017-11-09 DIAGNOSIS — I5031 Acute diastolic (congestive) heart failure: Secondary | ICD-10-CM | POA: Diagnosis not present

## 2017-11-09 DIAGNOSIS — Z801 Family history of malignant neoplasm of trachea, bronchus and lung: Secondary | ICD-10-CM | POA: Diagnosis not present

## 2017-11-09 DIAGNOSIS — Z96652 Presence of left artificial knee joint: Secondary | ICD-10-CM | POA: Diagnosis present

## 2017-11-09 DIAGNOSIS — D45 Polycythemia vera: Secondary | ICD-10-CM | POA: Diagnosis present

## 2017-11-09 DIAGNOSIS — J449 Chronic obstructive pulmonary disease, unspecified: Secondary | ICD-10-CM | POA: Diagnosis not present

## 2017-11-09 DIAGNOSIS — R0602 Shortness of breath: Secondary | ICD-10-CM | POA: Diagnosis not present

## 2017-11-09 DIAGNOSIS — K219 Gastro-esophageal reflux disease without esophagitis: Secondary | ICD-10-CM | POA: Diagnosis not present

## 2017-11-09 DIAGNOSIS — Z90710 Acquired absence of both cervix and uterus: Secondary | ICD-10-CM

## 2017-11-09 DIAGNOSIS — Z8249 Family history of ischemic heart disease and other diseases of the circulatory system: Secondary | ICD-10-CM

## 2017-11-09 DIAGNOSIS — D471 Chronic myeloproliferative disease: Secondary | ICD-10-CM | POA: Diagnosis present

## 2017-11-09 DIAGNOSIS — J9601 Acute respiratory failure with hypoxia: Secondary | ICD-10-CM | POA: Diagnosis present

## 2017-11-09 DIAGNOSIS — M109 Gout, unspecified: Secondary | ICD-10-CM | POA: Diagnosis not present

## 2017-11-09 DIAGNOSIS — J9621 Acute and chronic respiratory failure with hypoxia: Secondary | ICD-10-CM | POA: Diagnosis not present

## 2017-11-09 DIAGNOSIS — J440 Chronic obstructive pulmonary disease with acute lower respiratory infection: Secondary | ICD-10-CM | POA: Diagnosis present

## 2017-11-09 DIAGNOSIS — J810 Acute pulmonary edema: Secondary | ICD-10-CM

## 2017-11-09 DIAGNOSIS — I10 Essential (primary) hypertension: Secondary | ICD-10-CM | POA: Diagnosis present

## 2017-11-09 DIAGNOSIS — Z6841 Body Mass Index (BMI) 40.0 and over, adult: Secondary | ICD-10-CM

## 2017-11-09 DIAGNOSIS — I110 Hypertensive heart disease with heart failure: Secondary | ICD-10-CM | POA: Diagnosis not present

## 2017-11-09 DIAGNOSIS — I2510 Atherosclerotic heart disease of native coronary artery without angina pectoris: Secondary | ICD-10-CM | POA: Diagnosis not present

## 2017-11-09 DIAGNOSIS — I5033 Acute on chronic diastolic (congestive) heart failure: Secondary | ICD-10-CM | POA: Diagnosis present

## 2017-11-09 DIAGNOSIS — Z888 Allergy status to other drugs, medicaments and biological substances status: Secondary | ICD-10-CM | POA: Diagnosis not present

## 2017-11-09 DIAGNOSIS — J189 Pneumonia, unspecified organism: Principal | ICD-10-CM | POA: Diagnosis present

## 2017-11-09 DIAGNOSIS — C50412 Malignant neoplasm of upper-outer quadrant of left female breast: Secondary | ICD-10-CM | POA: Diagnosis present

## 2017-11-09 DIAGNOSIS — C50919 Malignant neoplasm of unspecified site of unspecified female breast: Secondary | ICD-10-CM | POA: Diagnosis not present

## 2017-11-09 DIAGNOSIS — R42 Dizziness and giddiness: Secondary | ICD-10-CM | POA: Diagnosis not present

## 2017-11-09 DIAGNOSIS — R0600 Dyspnea, unspecified: Secondary | ICD-10-CM | POA: Diagnosis not present

## 2017-11-09 HISTORY — DX: Pulmonary hypertension, unspecified: I27.20

## 2017-11-09 LAB — URINALYSIS, ROUTINE W REFLEX MICROSCOPIC
BILIRUBIN URINE: NEGATIVE
Glucose, UA: NEGATIVE mg/dL
HGB URINE DIPSTICK: NEGATIVE
Ketones, ur: 5 mg/dL — AB
Leukocytes, UA: NEGATIVE
NITRITE: NEGATIVE
PROTEIN: NEGATIVE mg/dL
SPECIFIC GRAVITY, URINE: 1.021 (ref 1.005–1.030)
pH: 5 (ref 5.0–8.0)

## 2017-11-09 LAB — CBC WITH DIFFERENTIAL/PLATELET
BASOS ABS: 0 10*3/uL (ref 0.0–0.1)
BASOS PCT: 0 %
Eosinophils Absolute: 0.3 10*3/uL (ref 0.0–0.7)
Eosinophils Relative: 4 %
HCT: 40.4 % (ref 36.0–46.0)
Hemoglobin: 12.6 g/dL (ref 12.0–15.0)
Lymphocytes Relative: 13 %
Lymphs Abs: 0.9 10*3/uL (ref 0.7–4.0)
MCH: 33.4 pg (ref 26.0–34.0)
MCHC: 31.2 g/dL (ref 30.0–36.0)
MCV: 107.2 fL — ABNORMAL HIGH (ref 78.0–100.0)
Monocytes Absolute: 0.6 10*3/uL (ref 0.1–1.0)
Monocytes Relative: 9 %
NEUTROS ABS: 4.8 10*3/uL (ref 1.7–7.7)
Neutrophils Relative %: 74 %
PLATELETS: 280 10*3/uL (ref 150–400)
RBC: 3.77 MIL/uL — AB (ref 3.87–5.11)
RDW: 13.6 % (ref 11.5–15.5)
WBC: 6.6 10*3/uL (ref 4.0–10.5)

## 2017-11-09 LAB — COMPREHENSIVE METABOLIC PANEL
ALT: 16 U/L (ref 14–54)
ANION GAP: 3 — AB (ref 5–15)
AST: 23 U/L (ref 15–41)
Albumin: 3.4 g/dL — ABNORMAL LOW (ref 3.5–5.0)
Alkaline Phosphatase: 84 U/L (ref 38–126)
BUN: 18 mg/dL (ref 6–20)
CHLORIDE: 104 mmol/L (ref 101–111)
CO2: 32 mmol/L (ref 22–32)
Calcium: 8.8 mg/dL — ABNORMAL LOW (ref 8.9–10.3)
Creatinine, Ser: 0.72 mg/dL (ref 0.44–1.00)
Glucose, Bld: 99 mg/dL (ref 65–99)
POTASSIUM: 4.6 mmol/L (ref 3.5–5.1)
Sodium: 139 mmol/L (ref 135–145)
TOTAL PROTEIN: 7.2 g/dL (ref 6.5–8.1)
Total Bilirubin: 1.1 mg/dL (ref 0.3–1.2)

## 2017-11-09 LAB — BLOOD GAS, VENOUS
Acid-Base Excess: 6.1 mmol/L — ABNORMAL HIGH (ref 0.0–2.0)
Bicarbonate: 32.7 mmol/L — ABNORMAL HIGH (ref 20.0–28.0)
DRAWN BY: 257881
O2 Saturation: 69.3 %
PATIENT TEMPERATURE: 98.6
PH VEN: 7.363 (ref 7.250–7.430)
PO2 VEN: 39 mmHg (ref 32.0–45.0)
pCO2, Ven: 59 mmHg (ref 44.0–60.0)

## 2017-11-09 LAB — I-STAT TROPONIN, ED: TROPONIN I, POC: 0 ng/mL (ref 0.00–0.08)

## 2017-11-09 LAB — I-STAT CG4 LACTIC ACID, ED: Lactic Acid, Venous: 0.44 mmol/L — ABNORMAL LOW (ref 0.5–1.9)

## 2017-11-09 LAB — TROPONIN I

## 2017-11-09 LAB — BRAIN NATRIURETIC PEPTIDE: B NATRIURETIC PEPTIDE 5: 119.5 pg/mL — AB (ref 0.0–100.0)

## 2017-11-09 MED ORDER — LORAZEPAM 2 MG/ML IJ SOLN
0.5000 mg | Freq: Once | INTRAMUSCULAR | Status: AC
  Administered 2017-11-09: 0.5 mg via INTRAVENOUS
  Filled 2017-11-09: qty 1

## 2017-11-09 MED ORDER — IPRATROPIUM-ALBUTEROL 0.5-2.5 (3) MG/3ML IN SOLN
3.0000 mL | Freq: Once | RESPIRATORY_TRACT | Status: AC
Start: 2017-11-09 — End: 2017-11-09
  Administered 2017-11-09: 3 mL via RESPIRATORY_TRACT
  Filled 2017-11-09: qty 3

## 2017-11-09 MED ORDER — OSELTAMIVIR PHOSPHATE 75 MG PO CAPS
75.0000 mg | ORAL_CAPSULE | Freq: Once | ORAL | Status: AC
  Administered 2017-11-09: 75 mg via ORAL
  Filled 2017-11-09: qty 1

## 2017-11-09 MED ORDER — DOXYCYCLINE HYCLATE 100 MG PO TABS
100.0000 mg | ORAL_TABLET | Freq: Once | ORAL | Status: AC
  Administered 2017-11-09: 100 mg via ORAL
  Filled 2017-11-09: qty 1

## 2017-11-09 MED ORDER — FUROSEMIDE 10 MG/ML IJ SOLN
40.0000 mg | INTRAMUSCULAR | Status: AC
  Administered 2017-11-09: 40 mg via INTRAVENOUS
  Filled 2017-11-09: qty 4

## 2017-11-09 MED ORDER — SODIUM CHLORIDE 0.9 % IJ SOLN
INTRAMUSCULAR | Status: AC
  Administered 2017-11-09
  Filled 2017-11-09: qty 150

## 2017-11-09 MED ORDER — DEXTROSE 5 % IV SOLN
1.0000 g | Freq: Once | INTRAVENOUS | Status: AC
  Administered 2017-11-09: 1 g via INTRAVENOUS
  Filled 2017-11-09: qty 10

## 2017-11-09 MED ORDER — IOPAMIDOL (ISOVUE-370) INJECTION 76%
INTRAVENOUS | Status: AC
  Administered 2017-11-09: 100 mL via INTRAVENOUS
  Filled 2017-11-09: qty 100

## 2017-11-09 NOTE — ED Notes (Signed)
Unable to collect 2nd set of blood cultures. EDMD aware.

## 2017-11-09 NOTE — ED Notes (Signed)
Placed Pt on purewick catheter as Pt's O2 drops with any movement. Performed Pericare prior to catheter placement.

## 2017-11-09 NOTE — ED Notes (Signed)
Only one set of blood cultures obtained due to patient refusing to be stuck. MD notified.

## 2017-11-09 NOTE — ED Notes (Signed)
ED Provider at bedside. 

## 2017-11-09 NOTE — ED Notes (Signed)
Patient transported to CT. CT notified staff patient having difficulty breathing after lying flat. MD notified. Placed patient on non rebreather for comfort and medication ordered.

## 2017-11-09 NOTE — H&P (Signed)
TR[facility] H&P   Patient Demographics:    [name], is a 77 y.o. female  MRN: [id number]   DOB - 1941-02-03  Admit Date - 11/09/2017  Outpatient Primary MD for the patient is [name], MD  Referring MD/NP/PA:  Dr. Rex Kras  Outpatient Specialists: Dr. Lisbeth Renshaw (rad-onc) Dr. Lindi Adie (oncology) Dr. Luan Pulling (pulmonary)  Patient coming from: home  Chief Complaint  Patient presents with  . Shortness of Breath      HPI:    [name]  is a 77 y.o. female, w breast cancer , Copd (not on home o2) apparently has had subjective fever since Wednesday as well as dry cough, and increasing dyspnea.  Pt was seen at office by her pcp Dr. Willey Blade and pox low.  Pt was sent to ER for evaluation.    In ED,  Pox low 76% on RA,. Pt tx with lasix 40mg  iv X1, rocephin 1gm iv x1  Wbc 6.6, Hgb 12.6, Plt 280 mcv 107 Na 139, I 46,  Bun 18, Creatinine 0.72 Alb 3.4 Ast 23, Alt 16,  BNP 119.5  CTA chestIMPRESSION: 1. Moderate respiratory motion artifact. No central or lobar pulmonary embolus. Suboptimal assessment of segmental arteries. 2. Interstitial and mild alveolar pulmonary edema. 3. Small area of consolidation in right lower lobe basal medial segment may represent pneumonia. 4. 4.1 cm main pulmonary artery may represent pulmonary artery hypertension. 5. Mild coronary and aortic calcific atherosclerosis. Mild cardiomegaly. 6. Moderate hiatal hernia. 7. Postsurgical changes in left breast partially visualized. Left breast skin thickening, possibly postradiation changes.    Review of systems:    In addition to the HPI above,   No Headache, No changes with Vision or hearing, No problems swallowing food or Liquids, No Chest pain No Abdominal pain, No Nausea or Vommitting, Bowel movements are regular, No Blood in stool or Urine, No dysuria, No new skin rashes or bruises, No new  joints pains-aches,  No new weakness, tingling, numbness in any extremity, No recent weight gain or loss, No polyuria, polydypsia or polyphagia, No significant Mental Stressors.  A full 10 point Review of Systems was done, except as stated above, all other Review of Systems were negative.   With Past History of the following :    Past Medical History:  Diagnosis Date  . Anemia 04/02/2015  . Arthritis   . Atypical glandular cells on vaginal Papanicolaou smear 04/12/2016   Well get colpo and biopsy with JVF  . Bronchitis, chronic obstructive ([place]) since 2000's  . Cancer [facility])    dx with endometrial cancer   . Dysrhythmia   . GERD (gastroesophageal reflux disease)   . Gout   . Heart murmur   . Hiatal hernia   . Hiatal hernia 11/19/2012  . History of endometrial cancer 04/06/2016   Sp hysterectomy 2012  . Hypertension   . Ischemic colitis ([place])  03/2015  . Myeloproliferative disorder, JAK-2 positive 01/05/2014  . Obesity   . Obesity, morbid (more than 100 lbs over ideal weight or BMI > 40) ([facility]) 08/2011   Ht 5'7", wt 280 lb  . Pneumonia 09/2014  . Polycythemia vera(238.4) 01/18/2014  . PONV (postoperative nausea and vomiting)    post-op hypoxic resp failure 09/2011 and require Bi-Pap (possible r/t - pressure pulm edema vs asp PNA, COPD exacerbation, obesity hypoventilation  . Pulmonary hypertension ([place])   . Shortness of breath    unable to lay flat due to dyspnea  . Vaginal Pap smear, abnormal       Past Surgical History:  Procedure Laterality Date  . ABDOMINAL HYSTERECTOMY  09/12/2011   Procedure: HYSTERECTOMY ABDOMINAL;  Surgeon: Janie Morning, MD;  Location: [facility];  Service: Gynecology;  Laterality: N/A;  Total Abdominal Hysterectomy, Bilateral Salpingo Oophorectomy  . BREAST LUMPECTOMY Left 07/10/2017   LEFT BREAST LUMPECTOMY WITH RADIOACTIVE SEED AND SENTINEL LYMPH NODE BIOPSY   . BREAST LUMPECTOMY WITH RADIOACTIVE SEED AND SENTINEL LYMPH NODE BIOPSY Left 07/10/2017    Procedure: LEFT BREAST LUMPECTOMY WITH RADIOACTIVE SEED AND SENTINEL LYMPH NODE BIOPSY;  Surgeon: Rolm Bookbinder, MD;  Location: [place];  Service: General;  Laterality: Left;  . COLONOSCOPY  07/05/2012   Procedure: COLONOSCOPY;  Surgeon: Rogene Houston, MD;  Location: [facility];  Service: Endoscopy;  Laterality: N/A;  730  . EYE SURGERY     bilateral 8 - 12 yrs ago  . FLEXIBLE SIGMOIDOSCOPY N/A 04/03/2015   Procedure: FLEXIBLE SIGMOIDOSCOPY;  Surgeon: Rogene Houston, MD;  [facility];  Service: Endoscopy;  Laterality: N/A;  . HYSTEROSCOPY W/D&C  08/15/2011   Procedure: DILATATION AND CURETTAGE (D&C) /HYSTEROSCOPY;  Surgeon: Jonnie Kind, MD;  Location: [facility];  Service: Gynecology;  Laterality: N/A;  With Suction Curette  . JOINT REPLACEMENT     left knee replaced 2014  . KNEE ARTHROPLASTY Left 03/15/2015   Procedure: COMPUTER ASSISTED TOTAL KNEE ARTHROPLASTY;  Surgeon: Marybelle Killings, MD;  Location: [place];  Service: Orthopedics;  Laterality: Left;  . SALPINGOOPHORECTOMY  09/12/2011   Procedure: SALPINGO OOPHERECTOMY;  Surgeon: Janie Morning, MD;  Location: [facility];  Service: Gynecology;  Laterality: Bilateral;      Social History:     Social History   Tobacco Use  . Smoking status: Never Smoker  . Smokeless tobacco: Never Used  Substance Use Topics  . Alcohol use: No     Lives - at home  Mobility - walks by self   Family History :     Family History  Problem Relation Age of Onset  . Other Mother   . Lung cancer Father   . Hypertension Son   . Anesthesia problems Neg Hx   . Hypotension Neg Hx   . Malignant hyperthermia Neg Hx   . Pseudochol deficiency Neg Hx       Home Medications:   Prior to Admission medications   Medication Sig Start Date End Date Taking? Authorizing Provider  albuterol (PROVENTIL HFA;VENTOLIN HFA) 108 (90 Base) MCG/ACT inhaler Inhale 2 puffs into the lungs every 6 (six) hours as needed for wheezing or shortness of breath.  Reported on 02/07/2016 02/07/16  Yes Penland, Kelby Fam, MD  aspirin EC 81 MG tablet Take 81 mg by mouth at bedtime.    Yes [provider]  atenolol (TENORMIN) 50 MG tablet Take 25 mg by mouth 2 (two) times daily.    Yes [provider]  cloNIDine (CATAPRES) 0.3 MG tablet Take 0.15 mg by mouth 2 (two) times daily.    Yes [provider]  diltiazem (CARDIZEM CD) 180 MG 24 hr capsule Take 180 mg by mouth daily.  03/05/13  Yes [provider]  esomeprazole (NEXIUM) 40 MG capsule Take 1 capsule (40 mg total) by mouth every morning. 09/15/14  Yes Setzer, Terri L, NP  ferrous sulfate 325 (65 FE) MG EC tablet Take 325 mg by mouth daily.   Yes [provider]  furosemide (LASIX) 20 MG tablet Take 1 tablet (20 mg total) by mouth daily. 07/12/17 11/09/17 Yes Elodia Florence., MD  hyaluronate sodium (RADIAPLEXRX) GEL Apply 1 application topically 2 (two) times daily. Apply  To skin after rad tx and bedtime, nothing 4 hours prior to rad tx 10/02/17  Yes Hayden Pedro, PA-C  losartan (COZAAR) 100 MG tablet Take 100 mg by mouth every evening.  02/25/13  Yes [provider]  non-metallic deodorant Jethro Poling) MISC Apply 1 application topically. Apply after rad tx daily and as needed,, nothing prior to rad tx 10/02/17  Yes Hayden Pedro, PA-C  Simethicone (GAS RELIEF PO) Take 1 tablet by mouth daily.    Yes [provider]  simvastatin (ZOCOR) 10 MG tablet Take 10 mg by mouth every evening.    Yes [provider]  diclofenac (VOLTAREN) 75 MG EC tablet Take 1 tablet (75 mg total) by mouth 2 (two) times daily. Patient not taking: Reported on 09/18/2017 10/10/16   Marybelle Killings, MD  hydroxyurea (HYDREA) 500 MG capsule TAKE TWO CAPSULES BY MOUTH ONCE DAILY Patient not taking: Reported on 11/09/2017 12/15/16   Baird Cancer, PA-C  oxyCODONE (OXY IR/ROXICODONE) 5 MG immediate release tablet Take 1 tablet (5 mg total) by mouth every 4 (four)  hours as needed for moderate pain. Patient not taking: Reported on 09/18/2017 07/11/17   Rolm Bookbinder, MD     Allergies:     Allergies  Allergen Reactions  . Carbamazepine Hives and Other (See Comments)    headache  . Sulfa Antibiotics     Rash     Physical Exam:   Vitals  Blood pressure (!) 146/69, pulse 71, temperature 98.3 F (36.8 C), temperature source Oral, resp. rate 19, height 5\' 4"  (1.626 m), weight 132 kg (291 lb), SpO2 92 %.   1. General  lying in bed in NAD,    2. Normal affect and insight, Not Suicidal or Homicidal, Awake Alert, Oriented X 3.  3. No F.N deficits, ALL C.Nerves Intact, Strength 5/5 all 4 extremities, Sensation intact all 4 extremities, Plantars down going.  4. Ears and Eyes appear Normal, Conjunctivae clear, PERRLA. Moist Oral Mucosa.  5. Supple Neck, No JVD, No cervical lymphadenopathy appriciated, No Carotid Bruits.  6. Symmetrical Chest wall movement, Good air movement bilaterally, + crackles left lung bases and mid lung  7. RRR,  S1, s2, 2/6 sem rusb  8. Positive Bowel Sounds, Abdomen Soft, No tenderness, No organomegaly appriciated,No rebound -guarding or rigidity.  9.  No Cyanosis, Normal Skin Turgor, No Skin Rash or Bruise.  10. Good muscle tone,  joints appear normal , no effusions, Normal ROM.  11. No Palpable Lymph Nodes in Neck or Axillae     Data Review:    CBC Recent Labs  Lab 11/09/17 1602  WBC 6.6  HGB 12.6  HCT 40.4  PLT 280  MCV 107.2*  MCH 33.4  MCHC 31.2  RDW 13.6  LYMPHSABS 0.9  MONOABS 0.6  EOSABS 0.3  BASOSABS 0.0   ------------------------------------------------------------------------------------------------------------------  Chemistries  Recent Labs  Lab 11/09/17 1602  NA 139  K 4.6  CL 104  CO2 32  GLUCOSE 99  BUN 18  CREATININE 0.72  CALCIUM 8.8*  AST 23  ALT 16  ALKPHOS 84  BILITOT 1.1    ------------------------------------------------------------------------------------------------------------------ estimated creatinine clearance is 79.6 mL/min (by C-G formula based on SCr of 0.72 mg/dL). ------------------------------------------------------------------------------------------------------------------ No results for input(s): TSH, T4TOTAL, T3FREE, THYROIDAB in the last 72 hours.  Invalid input(s): FREET3  Coagulation profile No results for input(s): INR, PROTIME in the last 168 hours. ------------------------------------------------------------------------------------------------------------------- No results for input(s): DDIMER in the last 72 hours. -------------------------------------------------------------------------------------------------------------------  Cardiac Enzymes No results for input(s): CKMB, TROPONINI, MYOGLOBIN in the last 168 hours.  Invalid input(s): CK ------------------------------------------------------------------------------------------------------------------    Component Value Date/Time   BNP 119.5 (H) 11/09/2017 1603     ---------------------------------------------------------------------------------------------------------------  Urinalysis    Component Value Date/Time   COLORURINE YELLOW 11/09/2017 1900   APPEARANCEUR CLEAR 11/09/2017 1900   LABSPEC 1.021 11/09/2017 1900   PHURINE 5.0 11/09/2017 1900   GLUCOSEU NEGATIVE 11/09/2017 1900   HGBUR NEGATIVE 11/09/2017 1900   BILIRUBINUR NEGATIVE 11/09/2017 1900   KETONESUR 5 (A) 11/09/2017 1900   PROTEINUR NEGATIVE 11/09/2017 1900   UROBILINOGEN 1.0 03/12/2015 1003   NITRITE NEGATIVE 11/09/2017 1900   LEUKOCYTESUR NEGATIVE 11/09/2017 1900    ----------------------------------------------------------------------------------------------------------------   Imaging Results:    Ct Angio Chest Pe W/cm &/or Wo Cm  Result Date: 11/09/2017 CLINICAL DATA:  77 y/o F; radiation  therapy 2 days ago. History of breast cancer. Patient presents with chills, lethargy, and shortness of breath. PE suspected, high pretest probability. EXAM: CT ANGIOGRAPHY CHEST WITH CONTRAST TECHNIQUE: Multidetector CT imaging of the chest was performed using the standard protocol during bolus administration of intravenous contrast. Multiplanar CT image reconstructions and MIPs were obtained to evaluate the vascular anatomy. CONTRAST:  159mL ISOVUE-370 IOPAMIDOL (ISOVUE-370) INJECTION 76% COMPARISON:  11/09/2017 chest radiograph FINDINGS: Cardiovascular: 3.8 cm ascending aorta. Mild aortic calcific atherosclerosis. 4.1 cm main pulmonary artery. Cardiomegaly. No pericardial effusion. Mild coronary artery calcification. Mitral annular calcification. Satisfactory opacification of main pulmonary arteries. Moderate respiratory artifact. No central or lobar pulmonary embolus. Suboptimal assessment of segmental arteries. Mediastinum/Nodes: No enlarged mediastinal, hilar, or axillary lymph nodes. Thyroid gland, trachea, and esophagus demonstrate no significant findings. Lungs/Pleura: Interlobular septal thickening and ground-glass opacification of the lungs compatible with interstitial and mild alveolar pulmonary edema. No pleural effusion. Consolidation in right lower lobe basal medial segment. No pneumothorax. Upper Abdomen: No acute abnormality. Musculoskeletal: Partially visualize left breast postsurgical changes. Left breast skin thickening. Moderate hiatal hernia. Review of the MIP images confirms the above findings. IMPRESSION: 1. Moderate respiratory motion artifact. No central or lobar pulmonary embolus. Suboptimal assessment of segmental arteries. 2. Interstitial and mild alveolar pulmonary edema. 3. Small area of consolidation in right lower lobe basal medial segment may represent pneumonia. 4. 4.1 cm main pulmonary artery may represent pulmonary artery hypertension. 5. Mild coronary and aortic calcific  atherosclerosis. Mild cardiomegaly. 6. Moderate hiatal hernia. 7. Postsurgical changes in left breast partially visualized. Left breast skin thickening, possibly postradiation changes. Electronically Signed   By: Kristine Garbe M.D.   On: 11/09/2017 20:00   Dg Chest Port 1 View  Result Date: 11/09/2017 CLINICAL DATA:  Hypoxia and shortness of breath. History breast cancer. EXAM: PORTABLE CHEST 1 VIEW COMPARISON:  07/11/2017 FINDINGS: Chronic cardiomegaly and vascular pedicle widening. Vascular congestion  similar to prior. Chronic asymmetric elevation of the left diaphragm. No Kerley lines, effusion, pneumothorax, or air bronchograms. IMPRESSION: 1. Cardiomegaly and vascular congestion. 2. Chronic low lung volumes with asymmetric right diaphragm elevation. Electronically Signed   By: Monte Fantasia M.D.   On: 11/09/2017 17:42     Assessment & Plan:    Principal Problem:   CAP (community acquired pneumonia)    Hypoxia,  CAP Blood culture x2 Urine legionella, urine stre antigen Sputum culture Viral resp culture Rocephin 1gm iv qday, zithromax 500mg  iv qday  Subjective Fever secondary to cap Monitor, tylenol prn   ? CHF Tele Trop I q6h x3 Check cardiac echo Lasix 40mg  iv qday Cont atenolol Cont losartan  CAD on CT scan Cont aspirin, cont atenolol, cont cardizem, cont losartan, cont simvastatin  Hypertension Continue clonidine, atenolol, cardizem, losartan  PCV Cont hydroxyurea  Gerd Cont PPI  Breast cancer Will need outpatient f/u with oncology       DVT Prophylaxis Lovenox - SCDs   AM Labs Ordered, also please review Full Orders  Family Communication: Admission, patients condition and plan of care including tests being ordered have been discussed with the patient  who indicate understanding and agree with the plan and Code Status.  Code Status Full code  Likely DC to  home  Condition GUARDED   Consults called: none  Admission status:  inpatient  Time spent in minutes : 45   Jani Gravel M.D on 11/09/2017 at 8:53 PM  Between 7am to 7pm - Pager - [phone]  . After 7pm go to [email or web address] - password [facility]  [facility] - Office  [phone]

## 2017-11-09 NOTE — ED Notes (Signed)
Bed: WA04 Expected date:  Expected time:  Means of arrival:  Comments: Hold for triage 

## 2017-11-09 NOTE — ED Provider Notes (Signed)
[facility] DEPT Provider Note   CSN: [id number] Arrival date & time: 11/09/17  1500     History   Chief Complaint Chief Complaint  Patient presents with  . Shortness of Breath    HPI CHEVIE BIRKHEAD is a 77 y.o. female.  77yo F w/ PMH including COPD, breast cancer on radiation currently, P. Vera who p/w shortness of breath, body aches, and hypoxia. She reports feeling SOB at least 1 month but it has been worse recently. She reports 2 days of chills, body aches, intermittent lightheadedness, and feeling feverish yesterday. She went to PCP today for these symptoms and was noted to be hypoxic, sent here for further evaluation. Last radiation was 2 days ago. She reports a cough at night when laying down. She reports LE edema, weight gain, PND, and orthopnea. She reports intermittent, non-exertional, sharp left-sided chest pain that sometimes goes down her left arm. No pleuritic chest pain. She was around a coworker recently who was diagnosed with the flu. No vomiting, diarrhea, urinary symptoms, recent travel, or h/o blood clots.    The history is provided by the patient.  Shortness of Breath     Past Medical History:  Diagnosis Date  . Anemia 04/02/2015  . Arthritis   . Atypical glandular cells on vaginal Papanicolaou smear 04/12/2016   Well get colpo and biopsy with JVF  . Bronchitis, chronic obstructive ([place]) since 2000's  . Cancer [facility])    dx with endometrial cancer   . Dysrhythmia   . GERD (gastroesophageal reflux disease)   . Gout   . Heart murmur   . Hiatal hernia   . Hiatal hernia 11/19/2012  . History of endometrial cancer 04/06/2016   Sp hysterectomy 2012  . Hypertension   . Ischemic colitis ([place])    03/2015  . Myeloproliferative disorder, JAK-2 positive 01/05/2014  . Obesity   . Obesity, morbid (more than 100 lbs over ideal weight or BMI > 40) (HCC) 08/2011   Ht 5'7", wt 280 lb  . Pneumonia 09/2014  . Polycythemia vera(238.4) 01/18/2014    . PONV (postoperative nausea and vomiting)    post-op hypoxic resp failure 09/2011 and require Bi-Pap (possible r/t - pressure pulm edema vs asp PNA, COPD exacerbation, obesity hypoventilation  . Shortness of breath    unable to lay flat due to dyspnea  . Vaginal Pap smear, abnormal     Patient Active Problem List   Diagnosis Date Noted  . Macrocytosis 07/11/2017  . Respiratory complication, postoperative   . Breast cancer ([place]) left 07/04/2017  . Malignant neoplasm of upper-outer quadrant of left breast in female, estrogen receptor positive ([place]) 06/22/2017  . Endometrial cancer, grade I ([place]) 04/20/2016  . History of endometrial cancer 04/06/2016  . Hematochezia 04/02/2015  . Rectal bleeding 04/02/2015  . Anemia 04/02/2015  . Bronchitis, chronic obstructive ([place])   . Arthritis   . Hypertension   . Dysrhythmia   . Obesity   . Essential hypertension   . Status post total left knee replacement 03/15/2015  . Myeloproliferative disorder, JAK-2 positive 01/05/2014  . Thrombocytosis ([place]) 12/19/2013  . CMC arthritis, thumb, degenerative 06/03/2013  . Bone, giant cell tumor 06/03/2013  . GERD (gastroesophageal reflux disease) 11/19/2012  . Hiatal hernia 11/19/2012  . Acute respiratory failure with hypoxia ([place]) 09/13/2011  . Bilateral pulmonary infiltrates on chest x-ray 09/13/2011  . Leukocytosis 09/13/2011  . Hyperglycemia 09/13/2011  . Chronic lung disease 08/16/2011    Class: Chronic  . Obesity,  morbid (more than 100 lbs over ideal weight or BMI > 40) ([place]) 08/07/2011  . KNEE, ARTHRITIS, DEGEN./OSTEO 05/24/2010    Past Surgical History:  Procedure Laterality Date  . ABDOMINAL HYSTERECTOMY  09/12/2011   Procedure: HYSTERECTOMY ABDOMINAL;  Surgeon: Janie Morning, MD;  Location: [facility];  Service: Gynecology;  Laterality: N/A;  Total Abdominal Hysterectomy, Bilateral Salpingo Oophorectomy  . BREAST LUMPECTOMY Left 07/10/2017   LEFT BREAST LUMPECTOMY WITH RADIOACTIVE SEED AND  SENTINEL LYMPH NODE BIOPSY   . BREAST LUMPECTOMY WITH RADIOACTIVE SEED AND SENTINEL LYMPH NODE BIOPSY Left 07/10/2017   Procedure: LEFT BREAST LUMPECTOMY WITH RADIOACTIVE SEED AND SENTINEL LYMPH NODE BIOPSY;  Surgeon: Rolm Bookbinder, MD;  Location: [facility];  Service: General;  Laterality: Left;  . COLONOSCOPY  07/05/2012   Procedure: COLONOSCOPY;  Surgeon: Rogene Houston, MD;  Location: [facility];  Service: Endoscopy;  Laterality: N/A;  730  . EYE SURGERY     bilateral 8 - 12 yrs ago  . FLEXIBLE SIGMOIDOSCOPY N/A 04/03/2015   Procedure: FLEXIBLE SIGMOIDOSCOPY;  Surgeon: Rogene Houston, MD;  Location: [facility];  Service: Endoscopy;  Laterality: N/A;  . HYSTEROSCOPY W/D&C  08/15/2011   Procedure: DILATATION AND CURETTAGE (D&C) /HYSTEROSCOPY;  Surgeon: Jonnie Kind, MD;  Location: [facility];  Service: Gynecology;  Laterality: N/A;  With Suction Curette  . JOINT REPLACEMENT     left knee replaced 2014  . KNEE ARTHROPLASTY Left 03/15/2015   Procedure: COMPUTER ASSISTED TOTAL KNEE ARTHROPLASTY;  Surgeon: Marybelle Killings, MD;  Location: [facility];  Service: Orthopedics;  Laterality: Left;  . SALPINGOOPHORECTOMY  09/12/2011   Procedure: SALPINGO OOPHERECTOMY;  Surgeon: Janie Morning, MD;  Location: [facility];  Service: Gynecology;  Laterality: Bilateral;    OB History    Gravida Para Term Preterm AB Living   5 5     0 5   SAB TAB Ectopic Multiple Live Births                   Home Medications    Prior to Admission medications   Medication Sig Start Date End Date Taking? Authorizing Provider  albuterol (PROVENTIL HFA;VENTOLIN HFA) 108 (90 Base) MCG/ACT inhaler Inhale 2 puffs into the lungs every 6 (six) hours as needed for wheezing or shortness of breath. Reported on 02/07/2016 Patient not taking: Reported on 09/18/2017 02/07/16   Patrici Ranks, MD  aspirin EC 81 MG tablet Take 81 mg by mouth at bedtime.     [provider]  atenolol (TENORMIN) 50 MG tablet Take 25 mg by mouth 2 (two)  times daily.     [provider]  cloNIDine (CATAPRES) 0.3 MG tablet Take 0.15 mg by mouth 2 (two) times daily.     [provider]  diclofenac (VOLTAREN) 75 MG EC tablet Take 1 tablet (75 mg total) by mouth 2 (two) times daily. Patient not taking: Reported on 09/18/2017 10/10/16   Marybelle Killings, MD  diltiazem (CARDIZEM CD) 180 MG 24 hr capsule Take 180 mg by mouth daily.  03/05/13   [provider]  diphenhydrAMINE (SOMINEX) 25 MG tablet Take 25 mg by mouth at bedtime.    [provider]  docusate sodium (COLACE) 100 MG capsule Take 100 mg by mouth daily as needed for mild constipation.     [provider]  esomeprazole (NEXIUM) 40 MG capsule Take 1 capsule (40 mg total) by mouth every morning. 09/15/14   Setzer, Rona Ravens, NP  ferrous  sulfate 325 (65 FE) MG EC tablet Take 325 mg by mouth daily.    [provider]  furosemide (LASIX) 20 MG tablet Take 1 tablet (20 mg total) by mouth daily. 07/12/17 09/18/17  Elodia Florence., MD  hyaluronate sodium (RADIAPLEXRX) GEL Apply 1 application topically 2 (two) times daily. Apply  To skin after rad tx and bedtime, nothing 4 hours prior to rad tx 10/02/17   Hayden Pedro, PA-C  hydroxyurea (HYDREA) 500 MG capsule TAKE TWO CAPSULES BY MOUTH ONCE DAILY 12/15/16   Baird Cancer, PA-C  losartan (COZAAR) 100 MG tablet Take 100 mg by mouth every evening.  02/25/13   [provider]  non-metallic deodorant Jethro Poling) MISC Apply 1 application topically. Apply after rad tx daily and as needed,, nothing prior to rad tx 10/02/17   Hayden Pedro, PA-C  oxyCODONE (OXY IR/ROXICODONE) 5 MG immediate release tablet Take 1 tablet (5 mg total) by mouth every 4 (four) hours as needed for moderate pain. Patient not taking: Reported on 09/18/2017 07/11/17   Rolm Bookbinder, MD  Simethicone (GAS RELIEF PO) Take 1 tablet by mouth daily.     [provider]  simvastatin (ZOCOR) 10 MG tablet  Take 10 mg by mouth every evening.     [provider]    Family History Family History  Problem Relation Age of Onset  . Other Mother   . Lung cancer Father   . Hypertension Son   . Anesthesia problems Neg Hx   . Hypotension Neg Hx   . Malignant hyperthermia Neg Hx   . Pseudochol deficiency Neg Hx     Social History Social History   Tobacco Use  . Smoking status: Never Smoker  . Smokeless tobacco: Never Used  Substance Use Topics  . Alcohol use: No  . Drug use: No     Allergies   Carbamazepine and Sulfa antibiotics   Review of Systems Review of Systems  Respiratory: Positive for shortness of breath.    All other systems reviewed and are negative except that which was mentioned in HPI   Physical Exam Updated Vital Signs BP (!) 163/71 (BP Location: Right Arm)   Pulse 72   Temp (!) 97.5 F (36.4 C) (Oral)   Resp (!) 22   Ht 5\' 4"  (1.626 m)   Wt 132 kg (291 lb)   SpO2 95%   BMI 49.95 kg/m   Physical Exam  Constitutional: She is oriented to person, place, and time. She appears well-developed and well-nourished.  Dyspneic   HENT:  Head: Normocephalic and atraumatic.  Moist mucous membranes  Eyes: Conjunctivae are normal. Pupils are equal, round, and reactive to light.  Neck: Neck supple.  Cardiovascular: Normal rate, regular rhythm and normal heart sounds.  No murmur heard. Pulmonary/Chest:  Increased WOB without respiratory distress, expiratory wheezes b/l upper fields, diminished b/l lung bases with some crackles  Abdominal: Soft. Bowel sounds are normal. She exhibits no distension. There is no tenderness.  Musculoskeletal:       Right lower leg: She exhibits edema.       Left lower leg: She exhibits edema.  3+ pitting edema BLE  Neurological: She is alert and oriented to person, place, and time.  Fluent speech  Skin: Skin is warm and dry.  Erythema on L anterior chest over breast and extending up to clavicle  Psychiatric: She has a normal  mood and affect. Judgment normal.  Nursing note and vitals reviewed.  ED Treatments / Results  Labs (all labs ordered are listed, but only abnormal results are displayed) Labs Reviewed  COMPREHENSIVE METABOLIC PANEL - Abnormal; Notable for the following components:      Result Value   Calcium 8.8 (*)    Albumin 3.4 (*)    Anion gap 3 (*)    All other components within normal limits  BRAIN NATRIURETIC PEPTIDE - Abnormal; Notable for the following components:   B Natriuretic Peptide 119.5 (*)    All other components within normal limits  CBC WITH DIFFERENTIAL/PLATELET - Abnormal; Notable for the following components:   RBC 3.77 (*)    MCV 107.2 (*)    All other components within normal limits  BLOOD GAS, VENOUS - Abnormal; Notable for the following components:   Bicarbonate 32.7 (*)    Acid-Base Excess 6.1 (*)    All other components within normal limits  URINALYSIS, ROUTINE W REFLEX MICROSCOPIC - Abnormal; Notable for the following components:   Ketones, ur 5 (*)    All other components within normal limits  I-STAT CG4 LACTIC ACID, ED - Abnormal; Notable for the following components:   Lactic Acid, Venous 0.44 (*)    All other components within normal limits  URINE CULTURE  CULTURE, BLOOD (ROUTINE X 2)  CULTURE, BLOOD (ROUTINE X 2)  RESPIRATORY PANEL BY PCR  TROPONIN I  TROPONIN I  TROPONIN I  I-STAT TROPONIN, ED    EKG  EKG Interpretation  Date/Time:  Friday November 09 2017 15:20:48 EST Ventricular Rate:  69 PR Interval:    QRS Duration: 155 QT Interval:  422 QTC Calculation: 453 R Axis:   32 Text Interpretation:  Sinus rhythm Right bundle branch block No significant change since last tracing Confirmed by Theotis Burrow [id number]) on 11/09/2017 3:25:17 PM       Radiology Ct Angio Chest Pe W/cm &/or Wo Cm  Result Date: 11/09/2017 CLINICAL DATA:  77 y/o F; radiation therapy 2 days ago. History of breast cancer. Patient presents with chills, lethargy, and  shortness of breath. PE suspected, high pretest probability. EXAM: CT ANGIOGRAPHY CHEST WITH CONTRAST TECHNIQUE: Multidetector CT imaging of the chest was performed using the standard protocol during bolus administration of intravenous contrast. Multiplanar CT image reconstructions and MIPs were obtained to evaluate the vascular anatomy. CONTRAST:  127mL ISOVUE-370 IOPAMIDOL (ISOVUE-370) INJECTION 76% COMPARISON:  11/09/2017 chest radiograph FINDINGS: Cardiovascular: 3.8 cm ascending aorta. Mild aortic calcific atherosclerosis. 4.1 cm main pulmonary artery. Cardiomegaly. No pericardial effusion. Mild coronary artery calcification. Mitral annular calcification. Satisfactory opacification of main pulmonary arteries. Moderate respiratory artifact. No central or lobar pulmonary embolus. Suboptimal assessment of segmental arteries. Mediastinum/Nodes: No enlarged mediastinal, hilar, or axillary lymph nodes. Thyroid gland, trachea, and esophagus demonstrate no significant findings. Lungs/Pleura: Interlobular septal thickening and ground-glass opacification of the lungs compatible with interstitial and mild alveolar pulmonary edema. No pleural effusion. Consolidation in right lower lobe basal medial segment. No pneumothorax. Upper Abdomen: No acute abnormality. Musculoskeletal: Partially visualize left breast postsurgical changes. Left breast skin thickening. Moderate hiatal hernia. Review of the MIP images confirms the above findings. IMPRESSION: 1. Moderate respiratory motion artifact. No central or lobar pulmonary embolus. Suboptimal assessment of segmental arteries. 2. Interstitial and mild alveolar pulmonary edema. 3. Small area of consolidation in right lower lobe basal medial segment may represent pneumonia. 4. 4.1 cm main pulmonary artery may represent pulmonary artery hypertension. 5. Mild coronary and aortic calcific atherosclerosis. Mild cardiomegaly. 6. Moderate hiatal hernia. 7. Postsurgical changes in left  breast partially visualized. Left breast skin thickening, possibly postradiation changes. Electronically Signed   By: Kristine Garbe M.D.   On: 11/09/2017 20:00   Dg Chest Port 1 View  Result Date: 11/09/2017 CLINICAL DATA:  Hypoxia and shortness of breath. History breast cancer. EXAM: PORTABLE CHEST 1 VIEW COMPARISON:  07/11/2017 FINDINGS: Chronic cardiomegaly and vascular pedicle widening. Vascular congestion similar to prior. Chronic asymmetric elevation of the left diaphragm. No Kerley lines, effusion, pneumothorax, or air bronchograms. IMPRESSION: 1. Cardiomegaly and vascular congestion. 2. Chronic low lung volumes with asymmetric right diaphragm elevation. Electronically Signed   By: Monte Fantasia M.D.   On: 11/09/2017 17:42    Procedures Procedures (including critical care time)  Medications Ordered in ED Medications  oseltamivir (TAMIFLU) capsule 75 mg (not administered)  ipratropium-albuterol (DUONEB) 0.5-2.5 (3) MG/3ML nebulizer solution 3 mL (not administered)     Initial Impression / Assessment and Plan / ED Course  I have reviewed the triage vital signs and the nursing notes.  Pertinent labs & imaging results that were available during my care of the patient were reviewed by me and considered in my medical decision making (see chart for details).    Pt w/ breast cancer on radiation p/w worsening SOB and 2 days of flu-like sx. she was hypoxic and requiring 4 L O2 to maintain saturations.  She was mentating appropriately and without respiratory distress on my exam.  Significant lower extremity edema noted, some faint expiratory wheezes.  Because of recent exposure to the flu and flulike symptoms, initiated Tamiflu and gave DuoNeb.  Normal trop, neg lactate, normal CMP and CBC.  Obtain CTA chest to rule out PE.  CTA negative for PE, does show pulmonary edema and small area of consolidation in right lower lobe, possible pneumonia.  Added ceftriaxone and doxycycline.  Also  gave IV Lasix.  Discussed admission with [facility] hospitalist, Dr. Maudie Mercury, and pt admitted for further treatment.  Final Clinical Impressions(s) / ED Diagnoses   Final diagnoses:  None    ED Discharge Orders    None       Karry Barrilleaux, Wenda Overland, MD 11/10/17 0100

## 2017-11-09 NOTE — ED Triage Notes (Signed)
Patient brought in by daughter stating she had radiation 2 days ago and saw PCP today for chills, lethargy, shortness of breath. Sent for fluid overload. Pt was in 106s on room air. 92% on 5L [place].

## 2017-11-10 ENCOUNTER — Other Ambulatory Visit: Payer: Self-pay

## 2017-11-10 ENCOUNTER — Inpatient Hospital Stay (HOSPITAL_COMMUNITY): Payer: Medicare Other

## 2017-11-10 DIAGNOSIS — J189 Pneumonia, unspecified organism: Principal | ICD-10-CM

## 2017-11-10 DIAGNOSIS — J9601 Acute respiratory failure with hypoxia: Secondary | ICD-10-CM

## 2017-11-10 DIAGNOSIS — R0600 Dyspnea, unspecified: Secondary | ICD-10-CM

## 2017-11-10 DIAGNOSIS — J9621 Acute and chronic respiratory failure with hypoxia: Secondary | ICD-10-CM

## 2017-11-10 DIAGNOSIS — J810 Acute pulmonary edema: Secondary | ICD-10-CM

## 2017-11-10 LAB — RESPIRATORY PANEL BY PCR
ADENOVIRUS-RVPPCR: NOT DETECTED
BORDETELLA PERTUSSIS-RVPCR: NOT DETECTED
CHLAMYDOPHILA PNEUMONIAE-RVPPCR: NOT DETECTED
CORONAVIRUS 229E-RVPPCR: NOT DETECTED
CORONAVIRUS HKU1-RVPPCR: NOT DETECTED
CORONAVIRUS NL63-RVPPCR: NOT DETECTED
Coronavirus OC43: NOT DETECTED
Influenza A: NOT DETECTED
Influenza B: NOT DETECTED
MYCOPLASMA PNEUMONIAE-RVPPCR: NOT DETECTED
Metapneumovirus: NOT DETECTED
Parainfluenza Virus 1: NOT DETECTED
Parainfluenza Virus 2: NOT DETECTED
Parainfluenza Virus 3: NOT DETECTED
Parainfluenza Virus 4: NOT DETECTED
Respiratory Syncytial Virus: NOT DETECTED
Rhinovirus / Enterovirus: NOT DETECTED

## 2017-11-10 LAB — ECHOCARDIOGRAM COMPLETE
AV Peak grad: 20 mmHg
AV pk vel: 222 cm/s
CHL CUP RV SYS PRESS: 70 mmHg
E/e' ratio: 14.73
FS: 38 % (ref 28–44)
Height: 64 in
IVS/LV PW RATIO, ED: 1.69
LA ID, A-P, ES: 46.1 mm
LADIAMINDEX: 2.01 cm/m2
LEFT ATRIUM END SYS DIAM: 46.1 mm
LV PW d: 12.4 mm — AB (ref 0.6–1.1)
LV TDI E'LATERAL: 8.69
LV TDI E'MEDIAL: 4.92
LV e' LATERAL: 8.69 cm/s
LVEEAVG: 14.73
LVEEMED: 14.73
LVOT area: 4.16 cm2
LVOTD: 23 mm
MV Peak grad: 7 mmHg
MV pk E vel: 128 m/s
MVPKAVEL: 160 m/s
Reg peak vel: 394 cm/s
TRMAXVEL: 394 cm/s
Weight: 4638.48 oz

## 2017-11-10 LAB — TROPONIN I
Troponin I: <0.03 ng/mL (ref <0.03)
Troponin I: <0.03 ng/mL (ref <0.03)

## 2017-11-10 MED ORDER — DEXTROSE 5 % IV SOLN: 1.0000 g | INTRAVENOUS | Status: DC

## 2017-11-10 MED ORDER — LOSARTAN POTASSIUM 50 MG PO TABS
100.0000 mg | ORAL_TABLET | Freq: Every evening | ORAL | Status: DC
  Administered 2017-11-10 – 2017-11-11 (×2): 100 mg via ORAL
  Filled 2017-11-10 (×2): qty 2

## 2017-11-10 MED ORDER — FUROSEMIDE 10 MG/ML IJ SOLN
40.0000 mg | Freq: Every day | INTRAMUSCULAR | Status: DC
  Administered 2017-11-10 – 2017-11-11 (×2): 40 mg via INTRAVENOUS
  Filled 2017-11-10 (×2): qty 4

## 2017-11-10 MED ORDER — ATENOLOL 25 MG PO TABS
25.0000 mg | ORAL_TABLET | Freq: Two times a day (BID) | ORAL | Status: DC
  Administered 2017-11-10 – 2017-11-12 (×5): 25 mg via ORAL
  Filled 2017-11-10 (×5): qty 1

## 2017-11-10 MED ORDER — DEXTROSE 5 % IV SOLN
500.0000 mg | INTRAVENOUS | Status: DC
  Administered 2017-11-10 – 2017-11-11 (×2): 500 mg via INTRAVENOUS
  Filled 2017-11-10 (×2): qty 500

## 2017-11-10 MED ORDER — DEXTROSE 5 % IV SOLN: 500.0000 mg | INTRAVENOUS | Status: DC

## 2017-11-10 MED ORDER — SODIUM CHLORIDE 0.9% FLUSH
3.0000 mL | Freq: Two times a day (BID) | INTRAVENOUS | Status: DC
  Administered 2017-11-10 – 2017-11-11 (×3): 3 mL via INTRAVENOUS

## 2017-11-10 MED ORDER — DILTIAZEM HCL ER COATED BEADS 180 MG PO CP24
180.0000 mg | ORAL_CAPSULE | Freq: Every day | ORAL | Status: DC
  Administered 2017-11-10 – 2017-11-12 (×3): 180 mg via ORAL
  Filled 2017-11-10 (×3): qty 1

## 2017-11-10 MED ORDER — NITROGLYCERIN 0.4 MG SL SUBL
0.4000 mg | SUBLINGUAL_TABLET | SUBLINGUAL | Status: DC | PRN
  Administered 2017-11-10: 0.4 mg via SUBLINGUAL
  Filled 2017-11-10 (×2): qty 1

## 2017-11-10 MED ORDER — FERROUS SULFATE 325 (65 FE) MG PO TABS
325.0000 mg | ORAL_TABLET | Freq: Every day | ORAL | Status: DC
  Administered 2017-11-10 – 2017-11-12 (×3): 325 mg via ORAL
  Filled 2017-11-10 (×3): qty 1

## 2017-11-10 MED ORDER — HYDROCODONE-ACETAMINOPHEN 5-325 MG PO TABS
1.0000 | ORAL_TABLET | Freq: Four times a day (QID) | ORAL | Status: DC | PRN
  Administered 2017-11-10: 1 via ORAL
  Filled 2017-11-10: qty 1

## 2017-11-10 MED ORDER — SIMVASTATIN 10 MG PO TABS
10.0000 mg | ORAL_TABLET | Freq: Every evening | ORAL | Status: DC
  Administered 2017-11-10: 10 mg via ORAL
  Filled 2017-11-10: qty 1

## 2017-11-10 MED ORDER — DEXTROSE 5 % IV SOLN
1.0000 g | INTRAVENOUS | Status: DC
  Administered 2017-11-10 – 2017-11-11 (×2): 1 g via INTRAVENOUS
  Filled 2017-11-10 (×3): qty 10

## 2017-11-10 MED ORDER — ALBUTEROL SULFATE HFA 108 (90 BASE) MCG/ACT IN AERS: 2.0000 | INHALATION_SPRAY | Freq: Four times a day (QID) | RESPIRATORY_TRACT | Status: DC | PRN

## 2017-11-10 MED ORDER — SODIUM CHLORIDE 0.9 % IV SOLN: 250.0000 mL | INTRAVENOUS | Status: DC | PRN

## 2017-11-10 MED ORDER — ENOXAPARIN SODIUM 40 MG/0.4ML ~~LOC~~ SOLN
40.0000 mg | SUBCUTANEOUS | Status: DC
  Administered 2017-11-10 – 2017-11-11 (×2): 40 mg via SUBCUTANEOUS
  Filled 2017-11-10 (×2): qty 0.4

## 2017-11-10 MED ORDER — SIMETHICONE 80 MG PO CHEW: 80.0000 mg | CHEWABLE_TABLET | Freq: Four times a day (QID) | ORAL | Status: DC | PRN

## 2017-11-10 MED ORDER — CLONIDINE HCL 0.3 MG PO TABS
0.1500 mg | ORAL_TABLET | Freq: Two times a day (BID) | ORAL | Status: DC
  Administered 2017-11-10 – 2017-11-12 (×5): 0.15 mg via ORAL
  Filled 2017-11-10 (×5): qty 1

## 2017-11-10 MED ORDER — ACETAMINOPHEN 325 MG PO TABS
650.0000 mg | ORAL_TABLET | Freq: Four times a day (QID) | ORAL | Status: DC | PRN
  Administered 2017-11-12: 650 mg via ORAL
  Filled 2017-11-10: qty 2

## 2017-11-10 MED ORDER — PANTOPRAZOLE SODIUM 40 MG PO TBEC
40.0000 mg | DELAYED_RELEASE_TABLET | Freq: Every day | ORAL | Status: DC
  Administered 2017-11-10 – 2017-11-12 (×3): 40 mg via ORAL
  Filled 2017-11-10 (×3): qty 1

## 2017-11-10 MED ORDER — ASPIRIN EC 81 MG PO TBEC
81.0000 mg | DELAYED_RELEASE_TABLET | Freq: Every day | ORAL | Status: DC
  Administered 2017-11-10 – 2017-11-11 (×2): 81 mg via ORAL
  Filled 2017-11-10 (×2): qty 1

## 2017-11-10 MED ORDER — ALBUTEROL SULFATE (2.5 MG/3ML) 0.083% IN NEBU
2.5000 mg | INHALATION_SOLUTION | Freq: Four times a day (QID) | RESPIRATORY_TRACT | Status: DC | PRN
  Administered 2017-11-10 (×2): 2.5 mg via RESPIRATORY_TRACT
  Filled 2017-11-10 (×2): qty 3

## 2017-11-10 MED ORDER — FUROSEMIDE 10 MG/ML IJ SOLN
40.0000 mg | Freq: Once | INTRAMUSCULAR | Status: AC
  Administered 2017-11-10: 40 mg via INTRAVENOUS
  Filled 2017-11-10: qty 4

## 2017-11-10 MED ORDER — SODIUM CHLORIDE 0.9% FLUSH: 3.0000 mL | INTRAVENOUS | Status: DC | PRN

## 2017-11-10 NOTE — Progress Notes (Signed)
Patient woke up out of sleep complaining of chest heaviness 7/10. Pt with labored breathing and tachypnea. EKG obtained and Vitals recorded. NP on call made aware. New orders placed. RT called to give pt PRN neb treatment. After administration of nitro x1 and lasix pt stated the chest heaviness resolved and the SOB improved. Pt still drowsy, but easily aroused. Son in room at time of event and spoke to daughter over the phone. Both informed and updated on patient's condition and POC. Will continue to monitor patient closely.

## 2017-11-10 NOTE — Progress Notes (Signed)
PROGRESS NOTE    [name]  QPR:[id number] DOB: 1941/10/17 DOA: 11/09/2017 PCP: [name], MD    Brief Narrative: [name]  is a 77 y.o. female, w breast cancer , Copd (not on home o2) apparently has had subjective fever since Wednesday as well as dry cough, and increasing dyspnea.  Pt was seen at office by her pcp Dr. Burr Medico and sent to ED for further evaluation.    Assessment & Plan:   Principal Problem:   CAP (community acquired pneumonia)   Acute respiratory failure with hypoxia secondary to a combination of community-acquired pneumonia and mild acute on chronic diastolic heart failure. Admitted to telemetry for closer monitoring. Started on IV Lasix with appropriate diuresis.  Continue with strict intake and output and daily weights.  Watch electrolytes and urine output and renal parameters while on IV Lasix.  Echocardiogram ordered and reviewed.  Continue with atenolol and losartan for now. Patient was started on IV Rocephin and Zithromax for the community-acquired pneumonia.  Blood cultures ordered and have been negative so far.  Urine for Legionella and urine for strep antigen pending Sputum cultures are pending Respiratory panel is negative. Cannula oxygen to keep sats greater than 90%.  Ambulate out of bed as tolerated.   Polycythemia vera resume hydroxyurea   Hypertension well controlled resume home medications  GERD continue with PPI   Breast cancer recommend outpatient follow-up with oncology.    Of coronary artery disease continue with aspirin beta-blocker losartan and statin.   DVT prophylaxis: Lovenox Code Status: (Full code Family Communication: None at bedside Disposition Plan: Resolution of respiratory symptoms. Consultants:   None   Procedures: (Echo, showing left ventricular ejection fraction in the range of 65% to 70% Dopplers parameters are consistent with abnormal left ventricular relaxation dysfunction/tach grade 1 diastolic  dysfunction.  No wall motion abnormalities wall thickness is increased in a pattern of moderate to severe LVH.   Antimicrobials: Rocephin and Zithromax since admission   Subjective: Reports breathing is the same as yesterday  Objective: Vitals:   11/10/17 0400 11/10/17 0530 11/10/17 0603 11/10/17 0643  BP: (!) 170/82 111/89 (!) 153/79 (!) 183/80  Pulse: 80 87 92 88  Resp: (!) 25 (!) 29 (!) 26 (!) 24  Temp:    98 F (36.7 C)  TempSrc:    Oral  SpO2: 95% 96% 90% 95%  Weight:    131.5 kg (289 lb 14.5 oz)  Height:    5\' 4"  (1.626 m)    Intake/Output Summary (Last 24 hours) at 11/10/2017 1522 Last data filed at 11/10/2017 1315 Gross per 24 hour  Intake 300 ml  Output 900 ml  Net -600 ml   Filed Weights   11/09/17 1521 11/09/17 1952 11/10/17 0643  Weight: 132 kg (291 lb) 132 kg (291 lb) 131.5 kg (289 lb 14.5 oz)    Examination:  General exam: Appears calm and comfortable on 3 L of nasal cannula oxygen Respiratory system: Diminished air entry at bases no wheezing or rhonchi Cardiovascular system: S1 & S2 heard, tachycardic. No JVD, murmurs, rubs, gallops or clicks.  Gastrointestinal system: Abdomen is nondistended, soft and nontender. No organomegaly or masses felt. Normal bowel sounds heard. Central nervous system: Alert and oriented. No focal neurological deficits. Extremities: Symmetric 5 x 5 power.  2+ pedal edema Skin: No rashes, lesions or ulcers Psychiatry: Judgement and insight appear normal. Mood & affect appropriate.     Data Reviewed: I have personally reviewed following labs and imaging studies  CBC: Recent Labs  Lab 11/09/17 1602  WBC 6.6  NEUTROABS 4.8  HGB 12.6  HCT 40.4  MCV 107.2*  PLT 631   Basic Metabolic Panel: Recent Labs  Lab 11/09/17 1602  NA 139  K 4.6  CL 104  CO2 32  GLUCOSE 99  BUN 18  CREATININE 0.72  CALCIUM 8.8*   GFR: Estimated Creatinine Clearance: 79.4 mL/min (by C-G formula based on SCr of 0.72 mg/dL). Liver Function  Tests: Recent Labs  Lab 11/09/17 1602  AST 23  ALT 16  ALKPHOS 84  BILITOT 1.1  PROT 7.2  ALBUMIN 3.4*   No results for input(s): LIPASE, AMYLASE in the last 168 hours. No results for input(s): AMMONIA in the last 168 hours. Coagulation Profile: No results for input(s): INR, PROTIME in the last 168 hours. Cardiac Enzymes: Recent Labs  Lab 11/09/17 2200 11/10/17 0403 11/10/17 0932  TROPONINI <0.03 <0.03 <0.03   BNP (last 3 results) No results for input(s): PROBNP in the last 8760 hours. HbA1C: No results for input(s): HGBA1C in the last 72 hours. CBG: No results for input(s): GLUCAP in the last 168 hours. Lipid Profile: No results for input(s): CHOL, HDL, LDLCALC, TRIG, CHOLHDL, LDLDIRECT in the last 72 hours. Thyroid Function Tests: No results for input(s): TSH, T4TOTAL, FREET4, T3FREE, THYROIDAB in the last 72 hours. Anemia Panel: No results for input(s): VITAMINB12, FOLATE, FERRITIN, TIBC, IRON, RETICCTPCT in the last 72 hours. Sepsis Labs: Recent Labs  Lab 11/09/17 1612  LATICACIDVEN 0.44*    Recent Results (from the past 240 hour(s))  Culture, blood (routine x 2)     Status: None (Preliminary result)   Collection Time: 11/09/17  4:08 PM  Result Value Ref Range Status   Specimen Description BLOOD BLOOD RIGHT ARM  Final   Special Requests   Final    BOTTLES DRAWN AEROBIC AND ANAEROBIC Blood Culture adequate volume   Culture   Final    NO GROWTH < 12 HOURS Performed at [facility], [place] [address] [place], [place] [address]    Report Status PENDING  Incomplete  Respiratory Panel by PCR     Status: None   Collection Time: 11/09/17  4:25 PM  Result Value Ref Range Status   Adenovirus NOT DETECTED NOT DETECTED Final   Coronavirus 229E NOT DETECTED NOT DETECTED Final   Coronavirus HKU1 NOT DETECTED NOT DETECTED Final   Coronavirus NL63 NOT DETECTED NOT DETECTED Final   Coronavirus OC43 NOT DETECTED NOT DETECTED Final   Metapneumovirus NOT DETECTED NOT  DETECTED Final   Rhinovirus / Enterovirus NOT DETECTED NOT DETECTED Final   Influenza A NOT DETECTED NOT DETECTED Final   Influenza B NOT DETECTED NOT DETECTED Final   Parainfluenza Virus 1 NOT DETECTED NOT DETECTED Final   Parainfluenza Virus 2 NOT DETECTED NOT DETECTED Final   Parainfluenza Virus 3 NOT DETECTED NOT DETECTED Final   Parainfluenza Virus 4 NOT DETECTED NOT DETECTED Final   Respiratory Syncytial Virus NOT DETECTED NOT DETECTED Final   Bordetella pertussis NOT DETECTED NOT DETECTED Final   Chlamydophila pneumoniae NOT DETECTED NOT DETECTED Final   Mycoplasma pneumoniae NOT DETECTED NOT DETECTED Final    Comment: Performed at [facility], [place] [address] [place], [place] [address]  Culture, blood (routine x 2)     Status: None (Preliminary result)   Collection Time: 11/09/17 10:05 PM  Result Value Ref Range Status   Specimen Description BLOOD RIGHT HAND  Final   Special Requests  Final    BOTTLES DRAWN AEROBIC AND ANAEROBIC Blood Culture adequate volume   Culture   Final    NO GROWTH < 12 HOURS Performed at [facility], [place] [address] [place], [place] [address]    Report Status PENDING  Incomplete         Radiology Studies: Ct Angio Chest Pe W/cm &/or Wo Cm  Result Date: 11/09/2017 CLINICAL DATA:  77 y/o F; radiation therapy 2 days ago. History of breast cancer. Patient presents with chills, lethargy, and shortness of breath. PE suspected, high pretest probability. EXAM: CT ANGIOGRAPHY CHEST WITH CONTRAST TECHNIQUE: Multidetector CT imaging of the chest was performed using the standard protocol during bolus administration of intravenous contrast. Multiplanar CT image reconstructions and MIPs were obtained to evaluate the vascular anatomy. CONTRAST:  155mL ISOVUE-370 IOPAMIDOL (ISOVUE-370) INJECTION 76% COMPARISON:  11/09/2017 chest radiograph FINDINGS: Cardiovascular: 3.8 cm ascending aorta. Mild aortic calcific atherosclerosis. 4.1 cm main pulmonary  artery. Cardiomegaly. No pericardial effusion. Mild coronary artery calcification. Mitral annular calcification. Satisfactory opacification of main pulmonary arteries. Moderate respiratory artifact. No central or lobar pulmonary embolus. Suboptimal assessment of segmental arteries. Mediastinum/Nodes: No enlarged mediastinal, hilar, or axillary lymph nodes. Thyroid gland, trachea, and esophagus demonstrate no significant findings. Lungs/Pleura: Interlobular septal thickening and ground-glass opacification of the lungs compatible with interstitial and mild alveolar pulmonary edema. No pleural effusion. Consolidation in right lower lobe basal medial segment. No pneumothorax. Upper Abdomen: No acute abnormality. Musculoskeletal: Partially visualize left breast postsurgical changes. Left breast skin thickening. Moderate hiatal hernia. Review of the MIP images confirms the above findings. IMPRESSION: 1. Moderate respiratory motion artifact. No central or lobar pulmonary embolus. Suboptimal assessment of segmental arteries. 2. Interstitial and mild alveolar pulmonary edema. 3. Small area of consolidation in right lower lobe basal medial segment may represent pneumonia. 4. 4.1 cm main pulmonary artery may represent pulmonary artery hypertension. 5. Mild coronary and aortic calcific atherosclerosis. Mild cardiomegaly. 6. Moderate hiatal hernia. 7. Postsurgical changes in left breast partially visualized. Left breast skin thickening, possibly postradiation changes. Electronically Signed   By: Kristine Garbe M.D.   On: 11/09/2017 20:00   Dg Chest Port 1 View  Result Date: 11/09/2017 CLINICAL DATA:  Hypoxia and shortness of breath. History breast cancer. EXAM: PORTABLE CHEST 1 VIEW COMPARISON:  07/11/2017 FINDINGS: Chronic cardiomegaly and vascular pedicle widening. Vascular congestion similar to prior. Chronic asymmetric elevation of the left diaphragm. No Kerley lines, effusion, pneumothorax, or air bronchograms.  IMPRESSION: 1. Cardiomegaly and vascular congestion. 2. Chronic low lung volumes with asymmetric right diaphragm elevation. Electronically Signed   By: Monte Fantasia M.D.   On: 11/09/2017 17:42        Scheduled Meds: . aspirin EC  81 mg Oral QHS  . atenolol  25 mg Oral BID  . cloNIDine  0.15 mg Oral BID  . diltiazem  180 mg Oral Daily  . enoxaparin (LOVENOX) injection  40 mg Subcutaneous Q24H  . ferrous sulfate  325 mg Oral Daily  . furosemide  40 mg Intravenous Daily  . losartan  100 mg Oral QPM  . pantoprazole  40 mg Oral Daily  . simvastatin  10 mg Oral QPM  . sodium chloride flush  3 mL Intravenous Q12H   Continuous Infusions: . sodium chloride    . azithromycin Stopped (11/10/17 0226)  . cefTRIAXone (ROCEPHIN)  IV       LOS: 1 day    Time spent: 35 minutes    Hosie Poisson, MD [facility] Pager [phone]  If 7PM-7AM, please contact night-coverage [email or web address] Password [email or web address] 11/10/2017, 3:22 PM

## 2017-11-10 NOTE — Progress Notes (Signed)
  Echocardiogram 2D Echocardiogram has been performed.  [name] M 11/10/2017, 10:44 AM

## 2017-11-10 NOTE — Progress Notes (Signed)
Call Gully RN @ [phone] for report.

## 2017-11-10 NOTE — Plan of Care (Signed)
  Clinical Measurements: Will remain free from infection 11/10/2017 2058 - Progressing by Ashley Murrain, RN

## 2017-11-11 DIAGNOSIS — I10 Essential (primary) hypertension: Secondary | ICD-10-CM

## 2017-11-11 DIAGNOSIS — D471 Chronic myeloproliferative disease: Secondary | ICD-10-CM

## 2017-11-11 LAB — URINE CULTURE: SPECIAL REQUESTS: NORMAL

## 2017-11-11 LAB — STREP PNEUMONIAE URINARY ANTIGEN: Strep Pneumo Urinary Antigen: NEGATIVE

## 2017-11-11 LAB — CBC
HCT: 41.8 % (ref 36.0–46.0)
Hemoglobin: 12.6 g/dL (ref 12.0–15.0)
MCH: 33.2 pg (ref 26.0–34.0)
MCHC: 30.1 g/dL (ref 30.0–36.0)
MCV: 110 fL — AB (ref 78.0–100.0)
PLATELETS: 326 10*3/uL (ref 150–400)
RBC: 3.8 MIL/uL — AB (ref 3.87–5.11)
RDW: 13.8 % (ref 11.5–15.5)
WBC: 7.1 10*3/uL (ref 4.0–10.5)

## 2017-11-11 LAB — BASIC METABOLIC PANEL
Anion gap: 6 (ref 5–15)
BUN: 13 mg/dL (ref 6–20)
CALCIUM: 8.6 mg/dL — AB (ref 8.9–10.3)
CO2: 38 mmol/L — AB (ref 22–32)
CREATININE: 0.52 mg/dL (ref 0.44–1.00)
Chloride: 94 mmol/L — ABNORMAL LOW (ref 101–111)
GFR calc non Af Amer: >60 mL/min (ref >60)
Glucose, Bld: 107 mg/dL — ABNORMAL HIGH (ref 65–99)
Potassium: 3.7 mmol/L (ref 3.5–5.1)
SODIUM: 138 mmol/L (ref 135–145)

## 2017-11-11 LAB — HIV ANTIBODY (ROUTINE TESTING W REFLEX): HIV SCREEN 4TH GENERATION: NONREACTIVE

## 2017-11-11 MED ORDER — FUROSEMIDE 10 MG/ML IJ SOLN
40.0000 mg | Freq: Two times a day (BID) | INTRAMUSCULAR | Status: DC
  Administered 2017-11-11: 40 mg via INTRAVENOUS
  Filled 2017-11-11: qty 4

## 2017-11-11 MED ORDER — POTASSIUM CHLORIDE CRYS ER 20 MEQ PO TBCR
20.0000 meq | EXTENDED_RELEASE_TABLET | Freq: Once | ORAL | Status: AC
  Administered 2017-11-11: 20 meq via ORAL
  Filled 2017-11-11: qty 1

## 2017-11-11 MED ORDER — CEFTRIAXONE SODIUM 1 G IJ SOLR
1.0000 g | Freq: Once | INTRAMUSCULAR | Status: AC
  Administered 2017-11-11: 1 g via INTRAMUSCULAR
  Filled 2017-11-11: qty 10

## 2017-11-11 MED ORDER — CYCLOBENZAPRINE HCL 5 MG PO TABS
5.0000 mg | ORAL_TABLET | Freq: Once | ORAL | Status: AC
  Administered 2017-11-11: 5 mg via ORAL
  Filled 2017-11-11: qty 1

## 2017-11-11 MED ORDER — AZITHROMYCIN 250 MG PO TABS
500.0000 mg | ORAL_TABLET | Freq: Every day | ORAL | Status: DC
  Administered 2017-11-11: 500 mg via ORAL
  Filled 2017-11-11: qty 2

## 2017-11-11 NOTE — Progress Notes (Signed)
SATURATION QUALIFICATIONS: (This note is used to comply with regulatory documentation for home oxygen)  Patient Saturations on Room Air at Rest =87%  Patient Saturations on Room Air while Ambulating = 78%  Patient Saturations on 4 Liters of oxygen while Ambulating = 94%  Please briefly explain why patient needs home oxygen:

## 2017-11-11 NOTE — Plan of Care (Signed)
  Clinical Measurements: Respiratory complications will improve 11/11/2017 2041 - Progressing by Ashley Murrain, RN   Clinical Measurements: Cardiovascular complication will be avoided 11/11/2017 2041 - Progressing by Ashley Murrain, RN

## 2017-11-11 NOTE — Progress Notes (Signed)
PROGRESS NOTE    [name]  WUX:[id number] DOB: Apr 30, 1941 DOA: 11/09/2017 PCP: [name], MD    Brief Narrative: [name]  is a 77 y.o. female, w breast cancer , Copd (not on home o2) apparently has had subjective fever since Wednesday as well as dry cough, and increasing dyspnea.  Pt was seen at office by her pcp Dr. Burr Medico and sent to ED for further evaluation.    Assessment & Plan:   Principal Problem:   CAP (community acquired pneumonia) Active Problems:   Acute respiratory failure with hypoxia ([place])   Myeloproliferative disorder, JAK-2 positive   Hypertension   Acute respiratory failure with hypoxia secondary to a combination of community-acquired pneumonia and mild acute on chronic diastolic heart failure. Admitted to telemetry for closer monitoring. Started on IV Lasix with appropriate diuresis.  Continue with strict intake and output and daily weights.  Watch electrolytes and urine output and renal parameters while on IV Lasix.  Echocardiogram ordered and reviewed.  Continue with atenolol and losartan for now.  Echocardiogram reviewed and discussed the results with the patient and family.  Increase Lasix from 40 mg daily to 40 mg twice a day.  Patient has diuresed about 2.3 L yesterday. Patient was started on IV Rocephin and Zithromax for the community-acquired pneumonia.  Blood cultures ordered and have been negative so far.  Urine for Legionella and urine for strep antigen pending Sputum cultures are pending Respiratory panel is negative. Continue with nasal cannula oxygen to keep sats greater than 90%.  Ambulate out of bed as tolerated.  We will order nasal cannula oxygen for discharge tomorrow   Polycythemia vera resume hydroxyurea   Hypertension well controlled resume home medications  GERD continue with PPI   Breast cancer recommend outpatient follow-up with oncology. Patient will need radiation therapy prior to discharge tomorrow   Of coronary  artery disease continue with aspirin beta-blocker losartan and statin.   DVT prophylaxis: Lovenox Code Status: (Full code Family Communication: None at bedside Disposition Plan: Resolution of respiratory symptoms.  Will DC in a.m. Consultants:   None   Procedures: (Echo, showing left ventricular ejection fraction in the range of 65% to 70% Dopplers parameters are consistent with abnormal left ventricular relaxation dysfunction/tach grade 1 diastolic dysfunction.  No wall motion abnormalities wall thickness is increased in a pattern of moderate to severe LVH.   Antimicrobials: Rocephin and Zithromax since admission   Subjective: Overnight patient had some chest pressure which was relieved with pain medication and nitro.  EKG was reviewed and troponins were negative.  Objective: Vitals:   11/11/17 0920 11/11/17 1432 11/11/17 1440 11/11/17 1445  BP:  (!) 126/58    Pulse: 70 65    Resp:  18    Temp:  98.1 F (36.7 C)    TempSrc:  Oral    SpO2:  94% (!) 87% (!) 79%  Weight:      Height:        Intake/Output Summary (Last 24 hours) at 11/11/2017 1625 Last data filed at 11/11/2017 1455 Gross per 24 hour  Intake 730 ml  Output 2100 ml  Net -1370 ml   Filed Weights   11/09/17 1521 11/09/17 1952 11/10/17 0643  Weight: 132 kg (291 lb) 132 kg (291 lb) 131.5 kg (289 lb 14.5 oz)    Examination:  General exam: Appears calm and comfortable on 3 L of nasal cannula oxygen Respiratory system: Decreased air entry at bases, no wheezing or rhonchi Cardiovascular system: S1 &  S2 heard, tachycardic. No JVD, murmurs, rubs, gallops or clicks.  Gastrointestinal system: Abdomen is soft, nontender, nondistended with good bowel sounds Central nervous system: Alert and oriented. No focal neurological deficits. Extremities: Symmetric 5 x 5 power.  2+ pedal edema Skin: No rashes, lesions or ulcers Psychiatry:. Mood & affect appropriate.     Data Reviewed: I have personally reviewed following  labs and imaging studies  CBC: Recent Labs  Lab 11/09/17 1602 11/11/17 0508  WBC 6.6 7.1  NEUTROABS 4.8  --   HGB 12.6 12.6  HCT 40.4 41.8  MCV 107.2* 110.0*  PLT 280 182   Basic Metabolic Panel: Recent Labs  Lab 11/09/17 1602 11/11/17 0508  NA 139 138  K 4.6 3.7  CL 104 94*  CO2 32 38*  GLUCOSE 99 107*  BUN 18 13  CREATININE 0.72 0.52  CALCIUM 8.8* 8.6*   GFR: Estimated Creatinine Clearance: 79.4 mL/min (by C-G formula based on SCr of 0.52 mg/dL). Liver Function Tests: Recent Labs  Lab 11/09/17 1602  AST 23  ALT 16  ALKPHOS 84  BILITOT 1.1  PROT 7.2  ALBUMIN 3.4*   No results for input(s): LIPASE, AMYLASE in the last 168 hours. No results for input(s): AMMONIA in the last 168 hours. Coagulation Profile: No results for input(s): INR, PROTIME in the last 168 hours. Cardiac Enzymes: Recent Labs  Lab 11/09/17 2200 11/10/17 0403 11/10/17 0932 11/10/17 2130  TROPONINI <0.03 <0.03 <0.03 <0.03   BNP (last 3 results) No results for input(s): PROBNP in the last 8760 hours. HbA1C: No results for input(s): HGBA1C in the last 72 hours. CBG: No results for input(s): GLUCAP in the last 168 hours. Lipid Profile: No results for input(s): CHOL, HDL, LDLCALC, TRIG, CHOLHDL, LDLDIRECT in the last 72 hours. Thyroid Function Tests: No results for input(s): TSH, T4TOTAL, FREET4, T3FREE, THYROIDAB in the last 72 hours. Anemia Panel: No results for input(s): VITAMINB12, FOLATE, FERRITIN, TIBC, IRON, RETICCTPCT in the last 72 hours. Sepsis Labs: Recent Labs  Lab 11/09/17 1612  LATICACIDVEN 0.44*    Recent Results (from the past 240 hour(s))  Culture, blood (routine x 2)     Status: None (Preliminary result)   Collection Time: 11/09/17  4:08 PM  Result Value Ref Range Status   Specimen Description BLOOD RIGHT ARM  Final   Special Requests   Final    BOTTLES DRAWN AEROBIC AND ANAEROBIC Blood Culture adequate volume   Culture   Final    NO GROWTH 2 DAYS Performed  at [facility], [address] [place], [place] [address]    Report Status PENDING  Incomplete  Respiratory Panel by PCR     Status: None   Collection Time: 11/09/17  4:25 PM  Result Value Ref Range Status   Adenovirus NOT DETECTED NOT DETECTED Final   Coronavirus 229E NOT DETECTED NOT DETECTED Final   Coronavirus HKU1 NOT DETECTED NOT DETECTED Final   Coronavirus NL63 NOT DETECTED NOT DETECTED Final   Coronavirus OC43 NOT DETECTED NOT DETECTED Final   Metapneumovirus NOT DETECTED NOT DETECTED Final   Rhinovirus / Enterovirus NOT DETECTED NOT DETECTED Final   Influenza A NOT DETECTED NOT DETECTED Final   Influenza B NOT DETECTED NOT DETECTED Final   Parainfluenza Virus 1 NOT DETECTED NOT DETECTED Final   Parainfluenza Virus 2 NOT DETECTED NOT DETECTED Final   Parainfluenza Virus 3 NOT DETECTED NOT DETECTED Final   Parainfluenza Virus 4 NOT DETECTED NOT DETECTED Final   Respiratory Syncytial  Virus NOT DETECTED NOT DETECTED Final   Bordetella pertussis NOT DETECTED NOT DETECTED Final   Chlamydophila pneumoniae NOT DETECTED NOT DETECTED Final   Mycoplasma pneumoniae NOT DETECTED NOT DETECTED Final    Comment: Performed at [facility], [place] [address], [place], [place] [address]  Urine culture     Status: Abnormal   Collection Time: 11/09/17  7:00 PM  Result Value Ref Range Status   Specimen Description URINE, CLEAN CATCH  Final   Special Requests Normal  Final   Culture MULTIPLE SPECIES PRESENT, SUGGEST RECOLLECTION (A)  Final   Report Status 11/11/2017 FINAL  Final  Culture, blood (routine x 2)     Status: None (Preliminary result)   Collection Time: 11/09/17 10:05 PM  Result Value Ref Range Status   Specimen Description BLOOD RIGHT HAND  Final   Special Requests   Final    BOTTLES DRAWN AEROBIC AND ANAEROBIC Blood Culture adequate volume   Culture   Final    NO GROWTH 2 DAYS Performed at [facility], [place] [address] [place], [place] [address]    Report  Status PENDING  Incomplete  Culture, blood (routine x 2) Call MD if unable to obtain prior to antibiotics being given     Status: None (Preliminary result)   Collection Time: 11/10/17  9:32 AM  Result Value Ref Range Status   Specimen Description BLOOD RIGHT HAND  Final   Special Requests IN PEDIATRIC BOTTLE Blood Culture adequate volume  Final   Culture   Final    NO GROWTH 1 DAY Performed at [facility], [place] [address] [place], [place] [address]    Report Status PENDING  Incomplete         Radiology Studies: Ct Angio Chest Pe W/cm &/or Wo Cm  Result Date: 11/09/2017 CLINICAL DATA:  77 y/o F; radiation therapy 2 days ago. History of breast cancer. Patient presents with chills, lethargy, and shortness of breath. PE suspected, high pretest probability. EXAM: CT ANGIOGRAPHY CHEST WITH CONTRAST TECHNIQUE: Multidetector CT imaging of the chest was performed using the standard protocol during bolus administration of intravenous contrast. Multiplanar CT image reconstructions and MIPs were obtained to evaluate the vascular anatomy. CONTRAST:  164mL ISOVUE-370 IOPAMIDOL (ISOVUE-370) INJECTION 76% COMPARISON:  11/09/2017 chest radiograph FINDINGS: Cardiovascular: 3.8 cm ascending aorta. Mild aortic calcific atherosclerosis. 4.1 cm main pulmonary artery. Cardiomegaly. No pericardial effusion. Mild coronary artery calcification. Mitral annular calcification. Satisfactory opacification of main pulmonary arteries. Moderate respiratory artifact. No central or lobar pulmonary embolus. Suboptimal assessment of segmental arteries. Mediastinum/Nodes: No enlarged mediastinal, hilar, or axillary lymph nodes. Thyroid gland, trachea, and esophagus demonstrate no significant findings. Lungs/Pleura: Interlobular septal thickening and ground-glass opacification of the lungs compatible with interstitial and mild alveolar pulmonary edema. No pleural effusion. Consolidation in right lower lobe basal medial segment.  No pneumothorax. Upper Abdomen: No acute abnormality. Musculoskeletal: Partially visualize left breast postsurgical changes. Left breast skin thickening. Moderate hiatal hernia. Review of the MIP images confirms the above findings. IMPRESSION: 1. Moderate respiratory motion artifact. No central or lobar pulmonary embolus. Suboptimal assessment of segmental arteries. 2. Interstitial and mild alveolar pulmonary edema. 3. Small area of consolidation in right lower lobe basal medial segment may represent pneumonia. 4. 4.1 cm main pulmonary artery may represent pulmonary artery hypertension. 5. Mild coronary and aortic calcific atherosclerosis. Mild cardiomegaly. 6. Moderate hiatal hernia. 7. Postsurgical changes in left breast partially visualized. Left breast skin thickening, possibly postradiation changes. Electronically Signed  By: Kristine Garbe M.D.   On: 11/09/2017 20:00   Dg Chest Port 1 View  Result Date: 11/10/2017 CLINICAL DATA:  Shortness of breath. EXAM: PORTABLE CHEST 1 VIEW COMPARISON:  Radiograph and chest CT yesterday. Multiple prior radiographs. FINDINGS: Unchanged cardiomegaly. Persistent elevation of right hemidiaphragm. Unchanged pulmonary vasculature from prior exam with vascular congestion. Right lower lobe consolidation on CT not as well visualized radiographically. Linear atelectasis in the left mid lung. No definite pleural effusion. No pneumothorax. IMPRESSION: 1. Unchanged appearance of the chest with cardiomegaly and vascular congestion. 2. Chronic elevation of right hemidiaphragm. Linear atelectasis or scarring in the left mid lung. Electronically Signed   By: Jeb Levering M.D.   On: 11/10/2017 21:35   Dg Chest Port 1 View  Result Date: 11/09/2017 CLINICAL DATA:  Hypoxia and shortness of breath. History breast cancer. EXAM: PORTABLE CHEST 1 VIEW COMPARISON:  07/11/2017 FINDINGS: Chronic cardiomegaly and vascular pedicle widening. Vascular congestion similar to prior.  Chronic asymmetric elevation of the left diaphragm. No Kerley lines, effusion, pneumothorax, or air bronchograms. IMPRESSION: 1. Cardiomegaly and vascular congestion. 2. Chronic low lung volumes with asymmetric right diaphragm elevation. Electronically Signed   By: Monte Fantasia M.D.   On: 11/09/2017 17:42        Scheduled Meds: . aspirin EC  81 mg Oral QHS  . atenolol  25 mg Oral BID  . azithromycin  500 mg Oral QHS  . cloNIDine  0.15 mg Oral BID  . cyclobenzaprine  5 mg Oral Once  . diltiazem  180 mg Oral Daily  . enoxaparin (LOVENOX) injection  40 mg Subcutaneous Q24H  . ferrous sulfate  325 mg Oral Daily  . furosemide  40 mg Intravenous BID  . losartan  100 mg Oral QPM  . pantoprazole  40 mg Oral Daily  . simvastatin  10 mg Oral QPM  . sodium chloride flush  3 mL Intravenous Q12H   Continuous Infusions: . sodium chloride    . cefTRIAXone (ROCEPHIN)  IV Stopped (11/10/17 2322)     LOS: 2 days    Time spent: 35 minutes    Hosie Poisson, MD [facility] Pager [phone]  If 7PM-7AM, please contact night-coverage [email or web address] Password [facility] 11/11/2017, 4:25 PM

## 2017-11-11 NOTE — Progress Notes (Signed)
PHARMACIST - PHYSICIAN COMMUNICATION DR:   Karleen Hampshire CONCERNING: Antibiotic IV to Oral Route Change Policy  RECOMMENDATION: This patient is receiving azithromycin by the intravenous route.  Based on criteria approved by the Pharmacy and Therapeutics Committee, the antibiotic(s) is/are being converted to the equivalent oral dose form(s).   DESCRIPTION: These criteria include:  Patient being treated for a respiratory tract infection, urinary tract infection, cellulitis or clostridium difficile associated diarrhea if on metronidazole  The patient is not neutropenic and does not exhibit a GI malabsorption state  The patient is eating (either orally or via tube) and/or has been taking other orally administered medications for a least 24 hours  The patient is improving clinically and has a Tmax < 100.5  If you have questions about this conversion, please contact the Pharmacy Department  []   [phone] )  [facility] []   [phone] )  [facility] []   [phone] )  [facility] []   [phone] )  [facility] [x]   [phone] )  [place], [place].D. [phone] 11/11/2017 1:49 PM

## 2017-11-12 ENCOUNTER — Ambulatory Visit: Payer: Medicare Other

## 2017-11-12 ENCOUNTER — Ambulatory Visit
Admission: RE | Admit: 2017-11-12 | Discharge: 2017-11-12 | Disposition: A | Discharge status: Home / Self Care | Payer: Medicare Other | Source: Ambulatory Visit | Attending: Radiation Oncology | Admitting: Radiation Oncology

## 2017-11-12 LAB — BASIC METABOLIC PANEL
Anion gap: 8 (ref 5–15)
BUN: 16 mg/dL (ref 6–20)
CHLORIDE: 94 mmol/L — AB (ref 101–111)
CO2: 37 mmol/L — AB (ref 22–32)
Calcium: 8.9 mg/dL (ref 8.9–10.3)
Creatinine, Ser: 0.55 mg/dL (ref 0.44–1.00)
GFR calc non Af Amer: >60 mL/min (ref >60)
Glucose, Bld: 106 mg/dL — ABNORMAL HIGH (ref 65–99)
POTASSIUM: 3.9 mmol/L (ref 3.5–5.1)
SODIUM: 139 mmol/L (ref 135–145)

## 2017-11-12 LAB — LEGIONELLA PNEUMOPHILA SEROGP 1 UR AG: L. pneumophila Serogp 1 Ur Ag: NEGATIVE

## 2017-11-12 MED ORDER — LEVOFLOXACIN 750 MG PO TABS: 750.0000 mg | ORAL_TABLET | Freq: Every day | ORAL | 0 refills | Status: AC

## 2017-11-12 MED ORDER — FUROSEMIDE 20 MG PO TABS: 40.0000 mg | ORAL_TABLET | Freq: Every day | ORAL | 0 refills | Status: DC

## 2017-11-12 NOTE — Discharge Summary (Addendum)
Physician Discharge Summary  [name]:[id number] DOB: [birth year] DOA: 78/02/2018  PCP: Lori Noble, MD  Admit date: 11/09/2017 Discharge date: 11/12/2017  Admitted From: Home.  Disposition:  HOme.   Recommendations for Outpatient Follow-up:  1. Follow up with PCP in 1-2 weeks 2. Please obtain BMP/CBC in one week   Home Health: yes  Discharge Condition: stable.  CODE STATUS: full code.  Diet recommendation: Heart Healthy   Brief/Interim Summary: [name],w breast cancer , Copd (not on home o2) apparently has had subjective fever since Wednesday as well as dry cough, and increasing dyspnea. Pt was seen at office by her pcp Dr. Burr Medico and sent to ED for further evaluation.     Discharge Diagnoses:  Principal Problem:   CAP (community acquired pneumonia) Active Problems:   Acute respiratory failure with hypoxia ([place])   Myeloproliferative disorder, JAK-2 positive   Hypertension   Acute respiratory failure with hypoxia secondary to a combination of community-acquired pneumonia and mild acute on chronic diastolic heart failure. Admitted to telemetry for closer monitoring. Started on IV Lasix with appropriate diuresis.  Continue with strict intake and output and daily weights.  Watch electrolytes and urine output and renal parameters while on  Lasix.  Echocardiogram ordered and reviewed.  Continue with atenolol and losartan for now.  Echocardiogram reviewed and discussed the results with the patient and family.  Increase Lasix from 40 mg daily o n discharge.  Patient was started on IV Rocephin and Zithromax for the community-acquired pneumonia.  Blood cultures ordered and have been negative so far.  Urine for Legionella and urine for strep antigen negative.   Respiratory panel is negative. Continue with nasal cannula oxygen to keep sats greater than 90%.  Ambulate out of bed as tolerated.  We will order nasal cannula oxygen for discharge  tomorrow   Recommend outpatient follow up with cardiology in 1 to 2 weeks.    Polycythemia vera resume hydroxyurea  Morbid obesity : Recommend outpatient follow up with PCP reg weight loss.    Hypertension well controlled resume home medications  GERD continue with PPI   Breast cancer recommend outpatient follow-up with oncology. Patient will need radiation therapy prior to discharge .   Of coronary artery disease continue with aspirin beta-blocker losartan and statin.     Discharge Instructions  Discharge Instructions    Diet - low sodium heart healthy   Complete by:  As directed    Increase activity slowly   Complete by:  As directed      Allergies as of 11/12/2017      Reactions   Carbamazepine Hives, Other (See Comments)   headache   Sulfa Antibiotics    Rash      Medication List    STOP taking these medications   oxyCODONE 5 MG immediate release tablet Commonly known as:  Oxy IR/ROXICODONE     TAKE these medications   albuterol 108 (90 Base) MCG/ACT inhaler Commonly known as:  PROVENTIL HFA;VENTOLIN HFA Inhale 2 puffs into the lungs every 6 (six) hours as needed for wheezing or shortness of breath. Reported on 02/07/2016   aspirin EC 81 MG tablet Take 81 mg by mouth at bedtime.   atenolol 50 MG tablet Commonly known as:  TENORMIN Take 25 mg by mouth 2 (two) times daily.   cloNIDine 0.3 MG tablet Commonly known as:  CATAPRES Take 0.15 mg by mouth 2 (two) times daily.   diclofenac 75 MG EC tablet Commonly known as:  VOLTAREN Take 1 tablet (75 mg total) by mouth 2 (two) times daily.   diltiazem 180 MG 24 hr capsule Commonly known as:  CARDIZEM CD Take 180 mg by mouth daily.   esomeprazole 40 MG capsule Commonly known as:  NEXIUM Take 1 capsule (40 mg total) by mouth every morning.   ferrous sulfate 325 (65 FE) MG EC tablet Take 325 mg by mouth daily.   furosemide 20 MG tablet Commonly known as:  LASIX Take 2 tablets (40 mg  total) by mouth daily. What changed:  how much to take   GAS RELIEF PO Take 1 tablet by mouth daily.   hyaluronate sodium Gel Apply 1 application topically 2 (two) times daily. Apply  To skin after rad tx and bedtime, nothing 4 hours prior to rad tx   hydroxyurea 500 MG capsule Commonly known as:  HYDREA TAKE TWO CAPSULES BY MOUTH ONCE DAILY   levofloxacin 750 MG tablet Commonly known as:  LEVAQUIN Take 1 tablet (750 mg total) by mouth daily for 5 days.   losartan 100 MG tablet Commonly known as:  COZAAR Take 100 mg by mouth every evening.   non-metallic deodorant Misc Commonly known as:  ALRA Apply 1 application topically. Apply after rad tx daily and as needed,, nothing prior to rad tx   simvastatin 10 MG tablet Commonly known as:  ZOCOR Take 10 mg by mouth every evening.            Durable Medical Equipment  (From admission, onward)        Start     Ordered   11/12/17 1339  DME Oxygen  Once    Question Answer Comment  Mode or (Route) Nasal cannula   Liters per Minute 4   Frequency Continuous (stationary and portable oxygen unit needed)   Oxygen conserving device Yes   Oxygen delivery system Gas      11/12/17 1339      Allergies  Allergen Reactions  . Carbamazepine Hives and Other (See Comments)    headache  . Sulfa Antibiotics     Rash    Consultations:  Radiation oncology    Procedures/Studies: Ct Angio Chest Pe W/cm &/or Wo Cm  Result Date: 11/09/2017 CLINICAL DATA:  77 y/o F; radiation therapy 2 days ago. History of breast cancer. Patient presents with chills, lethargy, and shortness of breath. PE suspected, high pretest probability. EXAM: CT ANGIOGRAPHY CHEST WITH CONTRAST TECHNIQUE: Multidetector CT imaging of the chest was performed using the standard protocol during bolus administration of intravenous contrast. Multiplanar CT image reconstructions and MIPs were obtained to evaluate the vascular anatomy. CONTRAST:  179mL ISOVUE-370 IOPAMIDOL  (ISOVUE-370) INJECTION 76% COMPARISON:  11/09/2017 chest radiograph FINDINGS: Cardiovascular: 3.8 cm ascending aorta. Mild aortic calcific atherosclerosis. 4.1 cm main pulmonary artery. Cardiomegaly. No pericardial effusion. Mild coronary artery calcification. Mitral annular calcification. Satisfactory opacification of main pulmonary arteries. Moderate respiratory artifact. No central or lobar pulmonary embolus. Suboptimal assessment of segmental arteries. Mediastinum/Nodes: No enlarged mediastinal, hilar, or axillary lymph nodes. Thyroid gland, trachea, and esophagus demonstrate no significant findings. Lungs/Pleura: Interlobular septal thickening and ground-glass opacification of the lungs compatible with interstitial and mild alveolar pulmonary edema. No pleural effusion. Consolidation in right lower lobe basal medial segment. No pneumothorax. Upper Abdomen: No acute abnormality. Musculoskeletal: Partially visualize left breast postsurgical changes. Left breast skin thickening. Moderate hiatal hernia. Review of the MIP images confirms the above findings. IMPRESSION: 1. Moderate respiratory motion artifact. No central or lobar pulmonary embolus. Suboptimal assessment  of segmental arteries. 2. Interstitial and mild alveolar pulmonary edema. 3. Small area of consolidation in right lower lobe basal medial segment may represent pneumonia. 4. 4.1 cm main pulmonary artery may represent pulmonary artery hypertension. 5. Mild coronary and aortic calcific atherosclerosis. Mild cardiomegaly. 6. Moderate hiatal hernia. 7. Postsurgical changes in left breast partially visualized. Left breast skin thickening, possibly postradiation changes. Electronically Signed   By: Kristine Garbe M.D.   On: 11/09/2017 20:00   Dg Chest Port 1 View  Result Date: 11/10/2017 CLINICAL DATA:  Shortness of breath. EXAM: PORTABLE CHEST 1 VIEW COMPARISON:  Radiograph and chest CT yesterday. Multiple prior radiographs. FINDINGS:  Unchanged cardiomegaly. Persistent elevation of right hemidiaphragm. Unchanged pulmonary vasculature from prior exam with vascular congestion. Right lower lobe consolidation on CT not as well visualized radiographically. Linear atelectasis in the left mid lung. No definite pleural effusion. No pneumothorax. IMPRESSION: 1. Unchanged appearance of the chest with cardiomegaly and vascular congestion. 2. Chronic elevation of right hemidiaphragm. Linear atelectasis or scarring in the left mid lung. Electronically Signed   By: Jeb Levering M.D.   On: 11/10/2017 21:35   Dg Chest Port 1 View  Result Date: 11/09/2017 CLINICAL DATA:  Hypoxia and shortness of breath. History breast cancer. EXAM: PORTABLE CHEST 1 VIEW COMPARISON:  07/11/2017 FINDINGS: Chronic cardiomegaly and vascular pedicle widening. Vascular congestion similar to prior. Chronic asymmetric elevation of the left diaphragm. No Kerley lines, effusion, pneumothorax, or air bronchograms. IMPRESSION: 1. Cardiomegaly and vascular congestion. 2. Chronic low lung volumes with asymmetric right diaphragm elevation. Electronically Signed   By: Monte Fantasia M.D.   On: 11/09/2017 17:42    Echo.   Subjective:  No new complaints.  Discharge Exam: Vitals:   11/11/17 2028 11/12/17 0504  BP: (!) 148/73 (!) 126/58  Pulse: 69 66  Resp: 18 18  Temp: 98.3 F (36.8 C) 98.4 F (36.9 C)  SpO2: 92% 93%   Vitals:   11/11/17 1440 11/11/17 1445 11/11/17 2028 11/12/17 0504  BP:   (!) 148/73 (!) 126/58  Pulse:   69 66  Resp:   18 18  Temp:   98.3 F (36.8 C) 98.4 F (36.9 C)  TempSrc:   Oral Oral  SpO2: (!) 87% (!) 79% 92% 93%  Weight:      Height:        General: Pt is alert, awake, not in acute distress Cardiovascular: RRR, S1/S2 +, no rubs, no gallops Respiratory: CTA bilaterally, no wheezing, no rhonchi Abdominal: Soft, NT, ND, bowel sounds + Extremities: no edema, no cyanosis    The results of significant diagnostics from this  hospitalization (including imaging, microbiology, ancillary and laboratory) are listed below for reference.     Microbiology: Recent Results (from the past 240 hour(s))  Culture, blood (routine x 2)     Status: None (Preliminary result)   Collection Time: 11/09/17  4:08 PM  Result Value Ref Range Status   Specimen Description BLOOD RIGHT ARM  Final   Special Requests   Final    BOTTLES DRAWN AEROBIC AND ANAEROBIC Blood Culture adequate volume   Culture   Final    NO GROWTH 3 DAYS Performed at [facility], [address], [place], [place] [address]    Report Status PENDING  Incomplete  Respiratory Panel by PCR     Status: None   Collection Time: 11/09/17  4:25 PM  Result Value Ref Range Status   Adenovirus NOT DETECTED NOT DETECTED Final   Coronavirus  229E NOT DETECTED NOT DETECTED Final   Coronavirus HKU1 NOT DETECTED NOT DETECTED Final   Coronavirus NL63 NOT DETECTED NOT DETECTED Final   Coronavirus OC43 NOT DETECTED NOT DETECTED Final   Metapneumovirus NOT DETECTED NOT DETECTED Final   Rhinovirus / Enterovirus NOT DETECTED NOT DETECTED Final   Influenza A NOT DETECTED NOT DETECTED Final   Influenza B NOT DETECTED NOT DETECTED Final   Parainfluenza Virus 1 NOT DETECTED NOT DETECTED Final   Parainfluenza Virus 2 NOT DETECTED NOT DETECTED Final   Parainfluenza Virus 3 NOT DETECTED NOT DETECTED Final   Parainfluenza Virus 4 NOT DETECTED NOT DETECTED Final   Respiratory Syncytial Virus NOT DETECTED NOT DETECTED Final   Bordetella pertussis NOT DETECTED NOT DETECTED Final   Chlamydophila pneumoniae NOT DETECTED NOT DETECTED Final   Mycoplasma pneumoniae NOT DETECTED NOT DETECTED Final    Comment: Performed at [facility], [place] [address] [place], [place] [address]  Urine culture     Status: Abnormal   Collection Time: 11/09/17  7:00 PM  Result Value Ref Range Status   Specimen Description URINE, CLEAN CATCH  Final   Special Requests Normal  Final   Culture MULTIPLE  SPECIES PRESENT, SUGGEST RECOLLECTION (A)  Final   Report Status 11/11/2017 FINAL  Final  Culture, blood (routine x 2)     Status: None (Preliminary result)   Collection Time: 11/09/17 10:05 PM  Result Value Ref Range Status   Specimen Description BLOOD RIGHT HAND  Final   Special Requests   Final    BOTTLES DRAWN AEROBIC AND ANAEROBIC Blood Culture adequate volume   Culture   Final    NO GROWTH 3 DAYS Performed at [facility], [place] [address] [place], [place] [address]    Report Status PENDING  Incomplete  Culture, blood (routine x 2) Call MD if unable to obtain prior to antibiotics being given     Status: None (Preliminary result)   Collection Time: 11/10/17  9:32 AM  Result Value Ref Range Status   Specimen Description BLOOD RIGHT HAND  Final   Special Requests IN PEDIATRIC BOTTLE Blood Culture adequate volume  Final   Culture   Final    NO GROWTH 2 DAYS Performed at [facility], [place] [address] [place], [place] [address]    Report Status PENDING  Incomplete  Culture, blood (routine x 2) Call MD if unable to obtain prior to antibiotics being given     Status: None (Preliminary result)   Collection Time: 11/11/17  5:08 AM  Result Value Ref Range Status   Specimen Description BLOOD RIGHT ANTECUBITAL  Final   Special Requests IN PEDIATRIC BOTTLE Blood Culture adequate volume  Final   Culture   Final    NO GROWTH 1 DAY Performed at [facility], [place] [address] [place], [place] [address]    Report Status PENDING  Incomplete     Labs: BNP (last 3 results) Recent Labs    07/11/17 1102 11/09/17 1603  BNP 197.2* 465.6*   Basic Metabolic Panel: Recent Labs  Lab 11/09/17 1602 11/11/17 0508 11/12/17 0432  NA 139 138 139  K 4.6 3.7 3.9  CL 104 94* 94*  CO2 32 38* 37*  GLUCOSE 99 107* 106*  BUN 18 13 16   CREATININE 0.72 0.52 0.55  CALCIUM 8.8* 8.6* 8.9   Liver Function Tests: Recent Labs  Lab 11/09/17 1602  AST 23  ALT 16  ALKPHOS 84  BILITOT  1.1  PROT 7.2  ALBUMIN 3.4*   No results for input(s): LIPASE, AMYLASE in the last 168 hours. No results for input(s): AMMONIA in the last 168 hours. CBC: Recent Labs  Lab 11/09/17 1602 11/11/17 0508  WBC 6.6 7.1  NEUTROABS 4.8  --   HGB 12.6 12.6  HCT 40.4 41.8  MCV 107.2* 110.0*  PLT 280 326   Cardiac Enzymes: Recent Labs  Lab 11/09/17 2200 11/10/17 0403 11/10/17 0932 11/10/17 2130  TROPONINI <0.03 <0.03 <0.03 <0.03   BNP: Invalid input(s): POCBNP CBG: No results for input(s): GLUCAP in the last 168 hours. D-Dimer No results for input(s): DDIMER in the last 72 hours. Hgb A1c No results for input(s): HGBA1C in the last 72 hours. Lipid Profile No results for input(s): CHOL, HDL, LDLCALC, TRIG, CHOLHDL, LDLDIRECT in the last 72 hours. Thyroid function studies No results for input(s): TSH, T4TOTAL, T3FREE, THYROIDAB in the last 72 hours.  Invalid input(s): FREET3 Anemia work up No results for input(s): VITAMINB12, FOLATE, FERRITIN, TIBC, IRON, RETICCTPCT in the last 72 hours. Urinalysis    Component Value Date/Time   COLORURINE YELLOW 11/09/2017 1900   APPEARANCEUR CLEAR 11/09/2017 1900   LABSPEC 1.021 11/09/2017 1900   PHURINE 5.0 11/09/2017 1900   GLUCOSEU NEGATIVE 11/09/2017 1900   HGBUR NEGATIVE 11/09/2017 1900   BILIRUBINUR NEGATIVE 11/09/2017 1900   KETONESUR 5 (A) 11/09/2017 1900   PROTEINUR NEGATIVE 11/09/2017 1900   UROBILINOGEN 1.0 03/12/2015 1003   NITRITE NEGATIVE 11/09/2017 1900   LEUKOCYTESUR NEGATIVE 11/09/2017 1900   Sepsis Labs Invalid input(s): PROCALCITONIN,  WBC,  LACTICIDVEN Microbiology Recent Results (from the past 240 hour(s))  Culture, blood (routine x 2)     Status: None (Preliminary result)   Collection Time: 11/09/17  4:08 PM  Result Value Ref Range Status   Specimen Description BLOOD RIGHT ARM  Final   Special Requests   Final    BOTTLES DRAWN AEROBIC AND ANAEROBIC Blood Culture adequate volume   Culture   Final    NO  GROWTH 3 DAYS Performed at [facility], [place] [address] [place], [place] [address]    Report Status PENDING  Incomplete  Respiratory Panel by PCR     Status: None   Collection Time: 11/09/17  4:25 PM  Result Value Ref Range Status   Adenovirus NOT DETECTED NOT DETECTED Final   Coronavirus 229E NOT DETECTED NOT DETECTED Final   Coronavirus HKU1 NOT DETECTED NOT DETECTED Final   Coronavirus NL63 NOT DETECTED NOT DETECTED Final   Coronavirus OC43 NOT DETECTED NOT DETECTED Final   Metapneumovirus NOT DETECTED NOT DETECTED Final   Rhinovirus / Enterovirus NOT DETECTED NOT DETECTED Final   Influenza A NOT DETECTED NOT DETECTED Final   Influenza B NOT DETECTED NOT DETECTED Final   Parainfluenza Virus 1 NOT DETECTED NOT DETECTED Final   Parainfluenza Virus 2 NOT DETECTED NOT DETECTED Final   Parainfluenza Virus 3 NOT DETECTED NOT DETECTED Final   Parainfluenza Virus 4 NOT DETECTED NOT DETECTED Final   Respiratory Syncytial Virus NOT DETECTED NOT DETECTED Final   Bordetella pertussis NOT DETECTED NOT DETECTED Final   Chlamydophila pneumoniae NOT DETECTED NOT DETECTED Final   Mycoplasma pneumoniae NOT DETECTED NOT DETECTED Final    Comment: Performed at [facility], [place] [address], [place], [place] [address]  Urine culture     Status: Abnormal   Collection Time: 11/09/17  7:00 PM  Result Value Ref Range Status   Specimen Description URINE, CLEAN  CATCH  Final   Special Requests Normal  Final   Culture MULTIPLE SPECIES PRESENT, SUGGEST RECOLLECTION (A)  Final   Report Status 11/11/2017 FINAL  Final  Culture, blood (routine x 2)     Status: None (Preliminary result)   Collection Time: 11/09/17 10:05 PM  Result Value Ref Range Status   Specimen Description BLOOD RIGHT HAND  Final   Special Requests   Final    BOTTLES DRAWN AEROBIC AND ANAEROBIC Blood Culture adequate volume   Culture   Final    NO GROWTH 3 DAYS Performed at [facility], [address], [place],  [place] [address]    Report Status PENDING  Incomplete  Culture, blood (routine x 2) Call MD if unable to obtain prior to antibiotics being given     Status: None (Preliminary result)   Collection Time: 11/10/17  9:32 AM  Result Value Ref Range Status   Specimen Description BLOOD RIGHT HAND  Final   Special Requests IN PEDIATRIC BOTTLE Blood Culture adequate volume  Final   Culture   Final    NO GROWTH 2 DAYS Performed at [facility], [place] [address], [place], [place] [address]    Report Status PENDING  Incomplete  Culture, blood (routine x 2) Call MD if unable to obtain prior to antibiotics being given     Status: None (Preliminary result)   Collection Time: 11/11/17  5:08 AM  Result Value Ref Range Status   Specimen Description BLOOD RIGHT ANTECUBITAL  Final   Special Requests IN PEDIATRIC BOTTLE Blood Culture adequate volume  Final   Culture   Final    NO GROWTH 1 DAY Performed at [facility], [place] [address] [place], [place] [address]    Report Status PENDING  Incomplete     Time coordinating discharge: Over 30 minutes  SIGNED:   Hosie Poisson, MD  [facility] 11/12/2017, 1:40 PM Pager   If 7PM-7AM, please contact night-coverage [email or web address] Password [email or web address]

## 2017-11-12 NOTE — Progress Notes (Signed)
Cyril Mourning, RN caring for patient today on [place] is aware that radiation transporters will present at 1015 to bring patient to radiation oncology department for a 1030 treatment.

## 2017-11-12 NOTE — Progress Notes (Signed)
[facility] Therapy Treatment Record Phone [phone]   Radiation Therapy was administered to [name] on: 11/12/2017  10:10 AM and was treatment # 27 out of a planned course of 33 treatments.  Radiation Treatment  1). Beam photons with 6-10 energy  2). Brachytherapy None  3). Stereotactic Radiosurgery None  4). Other Radiation None     Bennett Scrape, RT (T)

## 2017-11-12 NOTE — Care Management Important Message (Signed)
Important Message  Patient Details  Name: SWEETIE GIEBLER MRN: [id number] Date of Birth: 1941-09-19   Medicare Important Message Given:  Yes    Kerin Salen 11/12/2017, 12:11 [place] Message  Patient Details  Name: BRIENNA BASS MRN: [id number] Date of Birth: 1941-06-12   Medicare Important Message Given:  Yes    Kerin Salen 11/12/2017, 12:11 PM

## 2017-11-12 NOTE — Progress Notes (Addendum)
Patient loss IV access due to infiltration. Patient refused another IV site, because she felt like it was an allergic reaction to the medication. Patient informed that it was not an allergic reaction, because she got the same medication the previous night. Patient still refusing IV site. NP on call made aware.

## 2017-11-12 NOTE — Care Management Note (Signed)
Case Management Note  Patient Details  Name: [name] MRN: [id number] Date of Birth: [birth year]  Subjective/Objective:   Pt admitted with CAP                 Action/Plan: Pt plan to discharge home with [facility] for DME O2 and HH. Referral was given to in house rep.   Expected Discharge Date:  11/12/17               Expected Discharge Plan:  [place]  In-House Referral:     Discharge planning Services  CM Consult  Post Acute Care Choice:    Choice offered to:  Patient  DME Arranged:    DME Agency:     HH Arranged:  RN, PT [place] Agency:  [facility]  Status of Service:  Completed, signed off  If discussed at [place] of Stay Meetings, dates discussed:    Additional CommentsPurcell Mouton, RN 11/12/2017, 1:59 PM

## 2017-11-12 NOTE — Evaluation (Signed)
Physical Therapy One Time Evaluation Patient Details Name: [name] MRN: [id number] DOB: Mar 15, 1941 Today's Date: 11/12/2017   History of Present Illness  77 y.o. female with breast cancer currently undergoing radiation, COPD (not on home O2), myeloproliferative disorder and admitted for Acute respiratory failure with hypoxia secondary to a combination of community-acquired pneumonia and mild acute on chronic diastolic heart failure.  Clinical Impression  Patient evaluated by Physical Therapy with no further acute PT needs identified. All education has been completed and the patient has no further questions.  Pt ambulated short distance in hallway and requires supplemental oxygen.  Pt politely declined HHPT.  Spouse and son in room and can assist as needed upon d/c however pt mobilizing at supervision level. See below for any follow-up Physical Therapy or equipment needs. PT is signing off. Thank you for this referral.  SATURATION QUALIFICATIONS: (This note is used to comply with regulatory documentation for home oxygen)  Patient Saturations on Room Air at Rest = 87%  Patient Saturations on Room Air while Ambulating = N/A  Patient Saturations on 4 Liters of oxygen while Ambulating = 89%  Please briefly explain why patient needs home oxygen: to improve oxygen saturations at rest and during physical activity such as ambulation.     Follow Up Recommendations Home health PT(pt politely declined HHPT)    Equipment Recommendations  None recommended by PT    Recommendations for Other Services       Precautions / Restrictions Precautions Precautions: Other (comment) Precaution Comments: monitor sats      Mobility  Bed Mobility               General bed mobility comments: pt up in recliner on arrival  Transfers Overall transfer level: Needs assistance Equipment used: Rolling walker (2 wheeled) Transfers: Sit to/from Stand Sit to Stand: Supervision         General  transfer comment: SPO2 87% on room air, supervision for safety  Ambulation/Gait Ambulation/Gait assistance: Supervision Ambulation Distance (Feet): 60 Feet Assistive device: Rolling walker (2 wheeled) Gait Pattern/deviations: Step-through pattern;Decreased stride length     General Gait Details: distance to pt tolerance, pt reports ambulating today was improved compared to yesterday in regards to breathing, discussed current oxygen needs based on oxygen saturations  Stairs            Wheelchair Mobility    Modified Rankin (Stroke Patients Only)       Balance                                             Pertinent Vitals/Pain Pain Assessment: No/denies pain    Home Living Family/patient expects to be discharged to:: Private residence Living Arrangements: Spouse/significant other Available Help at Discharge: Family;Available PRN/intermittently Type of Home: House Home Access: Stairs to enter Entrance Stairs-Rails: None Entrance Stairs-Number of Steps: 1 Home Layout: One level Home Equipment: Walker - 4 wheels;Cane - single point Additional Comments: uses rollator    Prior Function Level of Independence: Independent with assistive device(s)               Hand Dominance        Extremity/Trunk Assessment        Lower Extremity Assessment Lower Extremity Assessment: Generalized weakness       Communication   Communication: No difficulties  Cognition Arousal/Alertness: Awake/alert Behavior During Therapy: WFL for  tasks assessed/performed Overall Cognitive Status: Within Functional Limits for tasks assessed                                        General Comments      Exercises     Assessment/Plan    PT Assessment Patent does not need any further PT services  PT Problem List         PT Treatment Interventions      PT Goals (Current goals can be found in the Care Plan section)  Acute Rehab PT Goals PT  Goal Formulation: All assessment and education complete, DC therapy    Frequency     Barriers to discharge        Co-evaluation               AM-PAC PT "6 Clicks" Daily Activity  Outcome Measure Difficulty turning over in bed (including adjusting bedclothes, sheets and blankets)?: None Difficulty moving from lying on back to sitting on the side of the bed? : None Difficulty sitting down on and standing up from a chair with arms (e.g., wheelchair, bedside commode, etc,.)?: A Little Help needed moving to and from a bed to chair (including a wheelchair)?: A Little Help needed walking in hospital room?: A Little Help needed climbing 3-5 steps with a railing? : A Little 6 Click Score: 20    End of Session Equipment Utilized During Treatment: Gait belt Activity Tolerance: Patient tolerated treatment well Patient left: in chair;with call bell/phone within reach;with family/visitor present Nurse Communication: Mobility status PT Visit Diagnosis: Other abnormalities of gait and mobility (R26.89)    Time: 6546-5035 PT Time Calculation (min) (ACUTE ONLY): 10 min   Charges:   PT Evaluation $PT Eval Low Complexity: 1 Low     PT G CodesCarmelia Bake, PT, DPT 11/12/2017 Pager: [phone]  York Ram E 11/12/2017, 12:50 PM

## 2017-11-13 ENCOUNTER — Ambulatory Visit: Payer: Medicare Other

## 2017-11-13 ENCOUNTER — Ambulatory Visit
Admission: RE | Admit: 2017-11-13 | Discharge: 2017-11-13 | Disposition: A | Discharge status: Home / Self Care | Payer: Medicare Other | Source: Ambulatory Visit | Attending: Radiation Oncology | Admitting: Radiation Oncology

## 2017-11-13 DIAGNOSIS — I5032 Chronic diastolic (congestive) heart failure: Secondary | ICD-10-CM | POA: Diagnosis not present

## 2017-11-13 DIAGNOSIS — I110 Hypertensive heart disease with heart failure: Secondary | ICD-10-CM | POA: Diagnosis not present

## 2017-11-13 DIAGNOSIS — J440 Chronic obstructive pulmonary disease with acute lower respiratory infection: Secondary | ICD-10-CM | POA: Diagnosis not present

## 2017-11-13 DIAGNOSIS — Z170 Estrogen receptor positive status [ER+]: Secondary | ICD-10-CM | POA: Diagnosis not present

## 2017-11-13 DIAGNOSIS — K469 Unspecified abdominal hernia without obstruction or gangrene: Secondary | ICD-10-CM | POA: Diagnosis not present

## 2017-11-13 DIAGNOSIS — K219 Gastro-esophageal reflux disease without esophagitis: Secondary | ICD-10-CM | POA: Diagnosis not present

## 2017-11-13 DIAGNOSIS — J189 Pneumonia, unspecified organism: Secondary | ICD-10-CM | POA: Diagnosis not present

## 2017-11-13 DIAGNOSIS — R011 Cardiac murmur, unspecified: Secondary | ICD-10-CM | POA: Diagnosis not present

## 2017-11-13 DIAGNOSIS — D649 Anemia, unspecified: Secondary | ICD-10-CM | POA: Diagnosis not present

## 2017-11-13 DIAGNOSIS — I10 Essential (primary) hypertension: Secondary | ICD-10-CM | POA: Diagnosis not present

## 2017-11-13 DIAGNOSIS — Z7951 Long term (current) use of inhaled steroids: Secondary | ICD-10-CM | POA: Diagnosis not present

## 2017-11-13 DIAGNOSIS — C50412 Malignant neoplasm of upper-outer quadrant of left female breast: Secondary | ICD-10-CM | POA: Diagnosis not present

## 2017-11-14 ENCOUNTER — Ambulatory Visit
Admission: RE | Admit: 2017-11-14 | Discharge: 2017-11-14 | Disposition: A | Discharge status: Home / Self Care | Payer: Medicare Other | Source: Ambulatory Visit | Attending: Radiation Oncology | Admitting: Radiation Oncology

## 2017-11-14 ENCOUNTER — Ambulatory Visit: Payer: Medicare Other

## 2017-11-14 DIAGNOSIS — R011 Cardiac murmur, unspecified: Secondary | ICD-10-CM | POA: Diagnosis not present

## 2017-11-14 DIAGNOSIS — C50412 Malignant neoplasm of upper-outer quadrant of left female breast: Secondary | ICD-10-CM | POA: Diagnosis not present

## 2017-11-14 DIAGNOSIS — K469 Unspecified abdominal hernia without obstruction or gangrene: Secondary | ICD-10-CM | POA: Diagnosis not present

## 2017-11-14 DIAGNOSIS — Z170 Estrogen receptor positive status [ER+]: Secondary | ICD-10-CM | POA: Diagnosis not present

## 2017-11-14 DIAGNOSIS — D649 Anemia, unspecified: Secondary | ICD-10-CM | POA: Diagnosis not present

## 2017-11-14 DIAGNOSIS — I10 Essential (primary) hypertension: Secondary | ICD-10-CM | POA: Diagnosis not present

## 2017-11-14 DIAGNOSIS — K219 Gastro-esophageal reflux disease without esophagitis: Secondary | ICD-10-CM | POA: Diagnosis not present

## 2017-11-14 LAB — CULTURE, BLOOD (ROUTINE X 2)
CULTURE: NO GROWTH
Culture: NO GROWTH
SPECIAL REQUESTS: ADEQUATE
Special Requests: ADEQUATE

## 2017-11-14 NOTE — Assessment & Plan Note (Signed)
07/10/2017 Left lumpectomy: IDC grade 2, 1.3 cm, intermediate grade DCIS, margins negative, 0/2 lymph nodes negative, ER 5% positive weak staining, PR 0%, HER-2 negative ratio 1.73, Ki-67 15%, T1cN0 stage IA AJCC 8  Mammaprint: Low risk luminal type A, average 10-year risk of recurrence untreated 10% I counseled her extensively regarding the results of the Mammaprint and provided her with a copy of the test. Postoperative breast cellulitis: Treated and resolved with antibiotics Adj XRT 10/01/17- 11/15/17  Recommendation: 1. Adjuvant antiestrogen therapy with anastrozole 1 mg daily times 5-7 years

## 2017-11-15 ENCOUNTER — Inpatient Hospital Stay: Payer: Medicare Other | Attending: Hematology and Oncology | Admitting: Hematology and Oncology

## 2017-11-15 ENCOUNTER — Ambulatory Visit
Admission: RE | Admit: 2017-11-15 | Discharge: 2017-11-15 | Disposition: A | Discharge status: Home / Self Care | Payer: Medicare Other | Source: Ambulatory Visit | Attending: Radiation Oncology | Admitting: Radiation Oncology

## 2017-11-15 ENCOUNTER — Telehealth: Payer: Self-pay | Admitting: Hematology and Oncology

## 2017-11-15 VITALS — BP 144/66 | HR 69 | Temp 98.6°F | Resp 16 | Ht 64.0 in | Wt 287.0 lb

## 2017-11-15 DIAGNOSIS — C50412 Malignant neoplasm of upper-outer quadrant of left female breast: Secondary | ICD-10-CM | POA: Insufficient documentation

## 2017-11-15 DIAGNOSIS — I5032 Chronic diastolic (congestive) heart failure: Secondary | ICD-10-CM | POA: Diagnosis not present

## 2017-11-15 DIAGNOSIS — D649 Anemia, unspecified: Secondary | ICD-10-CM | POA: Diagnosis not present

## 2017-11-15 DIAGNOSIS — J440 Chronic obstructive pulmonary disease with acute lower respiratory infection: Secondary | ICD-10-CM | POA: Diagnosis not present

## 2017-11-15 DIAGNOSIS — K469 Unspecified abdominal hernia without obstruction or gangrene: Secondary | ICD-10-CM | POA: Diagnosis not present

## 2017-11-15 DIAGNOSIS — Z7951 Long term (current) use of inhaled steroids: Secondary | ICD-10-CM | POA: Diagnosis not present

## 2017-11-15 DIAGNOSIS — K219 Gastro-esophageal reflux disease without esophagitis: Secondary | ICD-10-CM | POA: Diagnosis not present

## 2017-11-15 DIAGNOSIS — Z9981 Dependence on supplemental oxygen: Secondary | ICD-10-CM

## 2017-11-15 DIAGNOSIS — I110 Hypertensive heart disease with heart failure: Secondary | ICD-10-CM | POA: Diagnosis not present

## 2017-11-15 DIAGNOSIS — R011 Cardiac murmur, unspecified: Secondary | ICD-10-CM | POA: Diagnosis not present

## 2017-11-15 DIAGNOSIS — Z170 Estrogen receptor positive status [ER+]: Secondary | ICD-10-CM | POA: Insufficient documentation

## 2017-11-15 DIAGNOSIS — Z780 Asymptomatic menopausal state: Secondary | ICD-10-CM

## 2017-11-15 DIAGNOSIS — I10 Essential (primary) hypertension: Secondary | ICD-10-CM | POA: Diagnosis not present

## 2017-11-15 DIAGNOSIS — J189 Pneumonia, unspecified organism: Secondary | ICD-10-CM | POA: Diagnosis not present

## 2017-11-15 LAB — CULTURE, BLOOD (ROUTINE X 2)
CULTURE: NO GROWTH
Special Requests: ADEQUATE

## 2017-11-15 MED ORDER — LETROZOLE 2.5 MG PO TABS: 2.5000 mg | ORAL_TABLET | Freq: Every day | ORAL | 3 refills | Status: DC

## 2017-11-15 NOTE — Progress Notes (Signed)
Patient Care Team: [name], MD as PCP - General (Internal Medicine) Jonnie Kind, MD as Consulting Physician (Obstetrics and Gynecology) Janie Morning, MD as Attending Physician (Obstetrics and Gynecology) Carole Civil, MD as Consulting Physician (Orthopedic Surgery)  DIAGNOSIS:  Encounter Diagnosis  Name Primary?  . Malignant neoplasm of upper-outer quadrant of left breast in female, estrogen receptor positive ([place])     SUMMARY OF ONCOLOGIC HISTORY:   Malignant neoplasm of upper-outer quadrant of left breast in female, estrogen receptor positive ([place])   07/10/2017 Surgery    Left lumpectomy: IDC grade 2, 1.3 cm, intermediate grade DCIS, margins negative, 1/2 lymph nodes positive, ER 5% positive weak staining, PR 0%, HER-2 negative ratio 1.73, Ki-67 15%, T1cN0 stage IA AJCC 8      07/10/2017 Miscellaneous    Mammaprint: Low risk, 10-year risk of recurrence untreated 10%      10/01/2017 - 11/14/2017 Radiation Therapy    Adj XRT       CHIEF COMPLIANT: Follow-up after radiation  INTERVAL HISTORY: [name] is a 77 year old with above-mentioned history left breast cancer treated with lumpectomy and is currently undergoing radiation.  She will finish radiation in a few days.  She is here to discuss the next steps in the treatment plan which would be with antiestrogen therapy.  She is in a wheelchair and uses 24-hour oxygen.  REVIEW OF SYSTEMS:   Constitutional: Denies fevers, chills or abnormal weight loss Eyes: Denies blurriness of vision Ears, nose, mouth, throat, and face: Denies mucositis or sore throat Respiratory: Denies cough, dyspnea or wheezes Cardiovascular: Denies palpitation, chest discomfort Gastrointestinal:  Denies nausea, heartburn or change in bowel habits Skin: Denies abnormal skin rashes Lymphatics: Denies new lymphadenopathy or easy bruising Neurological:Denies numbness, tingling or new weaknesses Behavioral/Psych: Mood is stable, no new  changes  Extremities: No lower extremity edema  All other systems were reviewed with the patient and are negative.  I have reviewed the past medical history, past surgical history, social history and family history with the patient and they are unchanged from previous note.  ALLERGIES:  is allergic to carbamazepine and sulfa antibiotics.  MEDICATIONS:  Current Outpatient Medications  Medication Sig Dispense Refill  . albuterol (PROVENTIL HFA;VENTOLIN HFA) 108 (90 Base) MCG/ACT inhaler Inhale 2 puffs into the lungs every 6 (six) hours as needed for wheezing or shortness of breath. Reported on 02/07/2016 1 Inhaler 0  . aspirin EC 81 MG tablet Take 81 mg by mouth at bedtime.     Marland Kitchen atenolol (TENORMIN) 50 MG tablet Take 25 mg by mouth 2 (two) times daily.     . cloNIDine (CATAPRES) 0.3 MG tablet Take 0.15 mg by mouth 2 (two) times daily.     . diclofenac (VOLTAREN) 75 MG EC tablet Take 1 tablet (75 mg total) by mouth 2 (two) times daily. (Patient not taking: Reported on 09/18/2017) 60 tablet 3  . diltiazem (CARDIZEM CD) 180 MG 24 hr capsule Take 180 mg by mouth daily.     Marland Kitchen esomeprazole (NEXIUM) 40 MG capsule Take 1 capsule (40 mg total) by mouth every morning. 30 capsule 11  . ferrous sulfate 325 (65 FE) MG EC tablet Take 325 mg by mouth daily.    . furosemide (LASIX) 20 MG tablet Take 2 tablets (40 mg total) by mouth daily. 60 tablet 0  . hyaluronate sodium (RADIAPLEXRX) GEL Apply 1 application topically 2 (two) times daily. Apply  To skin after rad tx and bedtime, nothing 4 hours prior  to rad tx    . hydroxyurea (HYDREA) 500 MG capsule TAKE TWO CAPSULES BY MOUTH ONCE DAILY (Patient not taking: Reported on 11/09/2017) 180 capsule 1  . levofloxacin (LEVAQUIN) 750 MG tablet Take 1 tablet (750 mg total) by mouth daily for 5 days. 5 tablet 0  . losartan (COZAAR) 100 MG tablet Take 100 mg by mouth every evening.     . non-metallic deodorant Jethro Poling) MISC Apply 1 application topically. Apply after rad tx  daily and as needed,, nothing prior to rad tx    . Simethicone (GAS RELIEF PO) Take 1 tablet by mouth daily.     . simvastatin (ZOCOR) 10 MG tablet Take 10 mg by mouth every evening.      No current facility-administered medications for this visit.     PHYSICAL EXAMINATION: ECOG PERFORMANCE STATUS: 2 - Symptomatic, <50% confined to bed  Vitals:   11/15/17 1513  BP: (!) 144/66  Pulse: 69  Resp: 16  Temp: 98.6 F (37 C)  SpO2: 92%   Filed Weights   11/15/17 1513  Weight: 287 lb (130.2 kg)    GENERAL:alert, no distress and comfortable SKIN: skin color, texture, turgor are normal, no rashes or significant lesions EYES: normal, Conjunctiva are pink and non-injected, sclera clear OROPHARYNX:no exudate, no erythema and lips, buccal mucosa, and tongue normal  NECK: supple, thyroid normal size, non-tender, without nodularity LYMPH:  no palpable lymphadenopathy in the cervical, axillary or inguinal LUNGS: clear to auscultation and percussion with normal breathing effort HEART: regular rate & rhythm and no murmurs and no lower extremity edema ABDOMEN:abdomen soft, non-tender and normal bowel sounds MUSCULOSKELETAL:no cyanosis of digits and no clubbing  NEURO: alert & oriented x 3 with fluent speech, no focal motor/sensory deficits EXTREMITIES: No lower extremity edema  LABORATORY DATA:  I have reviewed the data as listed CMP Latest Ref Rng & Units 11/12/2017 11/11/2017 11/09/2017  Glucose 65 - 99 mg/dL 106(H) 107(H) 99  BUN 6 - 20 mg/dL _0 Creatinine 0.44 - 1.00 mg/dL 0.55 0.52 0.72  Sodium 135 - 145 mmol/L 139 138 139  Potassium 3.5 - 5.1 mmol/L 3.9 3.7 4.6  Chloride 101 - 111 mmol/L 94(L) 94(L) 104  CO2 22 - 32 mmol/L 37(H) 38(H) 32  Calcium 8.9 - 10.3 mg/dL 8.9 8.6(L) 8.8(L)  Total Protein 6.5 - 8.1 g/dL - - 7.2  Total Bilirubin 0.3 - 1.2 mg/dL - - 1.1  Alkaline Phos 38 - 126 U/L - - 84  AST 15 - 41 U/L - - 23  ALT 14 - 54 U/L - - 16    Lab Results  Component Value  Date   WBC 7.1 11/11/2017   HGB 12.6 11/11/2017   HCT 41.8 11/11/2017   MCV 110.0 (H) 11/11/2017   PLT 326 11/11/2017   NEUTROABS 4.8 11/09/2017    ASSESSMENT & PLAN:  Malignant neoplasm of upper-outer quadrant of left breast in female, estrogen receptor positive ([place]) 07/10/2017 Left lumpectomy: IDC grade 2, 1.3 cm, intermediate grade DCIS, margins negative, 0/2 lymph nodes negative, ER 5% positive weak staining, PR 0%, HER-2 negative ratio 1.73, Ki-67 15%, T1cN0 stage IA AJCC 8  Mammaprint: Low risk luminal type A, average 10-year risk of recurrence untreated 10% I counseled her extensively regarding the results of the Mammaprint and provided her with a copy of the test. Postoperative breast cellulitis: Treated and resolved with antibiotics Adj XRT 10/01/17- 11/15/17  Recommendation: 1. Adjuvant antiestrogen therapy with Letrozole 1 mg daily times 5-7  years We discussed the risks and benefits of anti-estrogen therapy with aromatase inhibitors. These include but not limited to insomnia, hot flashes, mood changes, vaginal dryness, bone density loss, and weight gain. We strongly believe that the benefits far outweigh the risks. Patient understands these risks and consented to starting treatment. Planned treatment duration is 5-10 years.  I spent 25 minutes talking to the patient of which more than half was spent in counseling and coordination of care.  No orders of the defined types were placed in this encounter.  The patient has a good understanding of the overall plan. she agrees with it. she will call with any problems that may develop before the next visit here.   Lori Ohara, MD 11/15/17

## 2017-11-15 NOTE — Telephone Encounter (Signed)
Gave patients daughter AVs and calendar of upcoming January through May appointments.

## 2017-11-16 ENCOUNTER — Ambulatory Visit: Payer: Medicare Other

## 2017-11-16 ENCOUNTER — Ambulatory Visit
Admission: RE | Admit: 2017-11-16 | Discharge: 2017-11-16 | Disposition: A | Discharge status: Home / Self Care | Payer: Medicare Other | Source: Ambulatory Visit | Attending: Radiation Oncology | Admitting: Radiation Oncology

## 2017-11-16 DIAGNOSIS — K219 Gastro-esophageal reflux disease without esophagitis: Secondary | ICD-10-CM | POA: Diagnosis not present

## 2017-11-16 DIAGNOSIS — C50412 Malignant neoplasm of upper-outer quadrant of left female breast: Secondary | ICD-10-CM | POA: Diagnosis not present

## 2017-11-16 DIAGNOSIS — R011 Cardiac murmur, unspecified: Secondary | ICD-10-CM | POA: Diagnosis not present

## 2017-11-16 DIAGNOSIS — K469 Unspecified abdominal hernia without obstruction or gangrene: Secondary | ICD-10-CM | POA: Diagnosis not present

## 2017-11-16 DIAGNOSIS — I5032 Chronic diastolic (congestive) heart failure: Secondary | ICD-10-CM | POA: Diagnosis not present

## 2017-11-16 DIAGNOSIS — Z170 Estrogen receptor positive status [ER+]: Secondary | ICD-10-CM | POA: Diagnosis not present

## 2017-11-16 DIAGNOSIS — I110 Hypertensive heart disease with heart failure: Secondary | ICD-10-CM | POA: Diagnosis not present

## 2017-11-16 DIAGNOSIS — J189 Pneumonia, unspecified organism: Secondary | ICD-10-CM | POA: Diagnosis not present

## 2017-11-16 DIAGNOSIS — D649 Anemia, unspecified: Secondary | ICD-10-CM | POA: Diagnosis not present

## 2017-11-16 DIAGNOSIS — I10 Essential (primary) hypertension: Secondary | ICD-10-CM | POA: Diagnosis not present

## 2017-11-16 DIAGNOSIS — J440 Chronic obstructive pulmonary disease with acute lower respiratory infection: Secondary | ICD-10-CM | POA: Diagnosis not present

## 2017-11-16 DIAGNOSIS — Z7951 Long term (current) use of inhaled steroids: Secondary | ICD-10-CM | POA: Diagnosis not present

## 2017-11-16 LAB — CULTURE, BLOOD (ROUTINE X 2)
CULTURE: NO GROWTH
SPECIAL REQUESTS: ADEQUATE

## 2017-11-19 ENCOUNTER — Ambulatory Visit: Payer: Medicare Other

## 2017-11-19 ENCOUNTER — Ambulatory Visit
Admission: RE | Admit: 2017-11-19 | Discharge: 2017-11-19 | Disposition: A | Discharge status: Home / Self Care | Payer: Medicare Other | Source: Ambulatory Visit | Attending: Radiation Oncology | Admitting: Radiation Oncology

## 2017-11-19 DIAGNOSIS — K219 Gastro-esophageal reflux disease without esophagitis: Secondary | ICD-10-CM | POA: Diagnosis not present

## 2017-11-19 DIAGNOSIS — D649 Anemia, unspecified: Secondary | ICD-10-CM | POA: Diagnosis not present

## 2017-11-19 DIAGNOSIS — K469 Unspecified abdominal hernia without obstruction or gangrene: Secondary | ICD-10-CM | POA: Diagnosis not present

## 2017-11-19 DIAGNOSIS — Z170 Estrogen receptor positive status [ER+]: Secondary | ICD-10-CM | POA: Diagnosis not present

## 2017-11-19 DIAGNOSIS — R011 Cardiac murmur, unspecified: Secondary | ICD-10-CM | POA: Diagnosis not present

## 2017-11-19 DIAGNOSIS — C50412 Malignant neoplasm of upper-outer quadrant of left female breast: Secondary | ICD-10-CM | POA: Diagnosis not present

## 2017-11-19 DIAGNOSIS — I10 Essential (primary) hypertension: Secondary | ICD-10-CM | POA: Diagnosis not present

## 2017-11-20 ENCOUNTER — Encounter: Payer: Self-pay | Admitting: Radiation Oncology

## 2017-11-20 ENCOUNTER — Ambulatory Visit
Admission: RE | Admit: 2017-11-20 | Discharge: 2017-11-20 | Disposition: A | Discharge status: Home / Self Care | Payer: Medicare Other | Source: Ambulatory Visit | Attending: Radiation Oncology | Admitting: Radiation Oncology

## 2017-11-20 DIAGNOSIS — K469 Unspecified abdominal hernia without obstruction or gangrene: Secondary | ICD-10-CM | POA: Diagnosis not present

## 2017-11-20 DIAGNOSIS — Z170 Estrogen receptor positive status [ER+]: Secondary | ICD-10-CM | POA: Diagnosis not present

## 2017-11-20 DIAGNOSIS — I5032 Chronic diastolic (congestive) heart failure: Secondary | ICD-10-CM | POA: Diagnosis not present

## 2017-11-20 DIAGNOSIS — R011 Cardiac murmur, unspecified: Secondary | ICD-10-CM | POA: Diagnosis not present

## 2017-11-20 DIAGNOSIS — I10 Essential (primary) hypertension: Secondary | ICD-10-CM | POA: Diagnosis not present

## 2017-11-20 DIAGNOSIS — I110 Hypertensive heart disease with heart failure: Secondary | ICD-10-CM | POA: Diagnosis not present

## 2017-11-20 DIAGNOSIS — D649 Anemia, unspecified: Secondary | ICD-10-CM | POA: Diagnosis not present

## 2017-11-20 DIAGNOSIS — Z7951 Long term (current) use of inhaled steroids: Secondary | ICD-10-CM | POA: Diagnosis not present

## 2017-11-20 DIAGNOSIS — J440 Chronic obstructive pulmonary disease with acute lower respiratory infection: Secondary | ICD-10-CM | POA: Diagnosis not present

## 2017-11-20 DIAGNOSIS — C50412 Malignant neoplasm of upper-outer quadrant of left female breast: Secondary | ICD-10-CM | POA: Diagnosis not present

## 2017-11-20 DIAGNOSIS — J189 Pneumonia, unspecified organism: Secondary | ICD-10-CM | POA: Diagnosis not present

## 2017-11-20 DIAGNOSIS — K219 Gastro-esophageal reflux disease without esophagitis: Secondary | ICD-10-CM | POA: Diagnosis not present

## 2017-11-22 ENCOUNTER — Telehealth: Payer: Self-pay | Admitting: Radiation Oncology

## 2017-11-22 DIAGNOSIS — Z23 Encounter for immunization: Secondary | ICD-10-CM | POA: Diagnosis not present

## 2017-11-22 DIAGNOSIS — J9601 Acute respiratory failure with hypoxia: Secondary | ICD-10-CM | POA: Diagnosis not present

## 2017-11-22 DIAGNOSIS — J189 Pneumonia, unspecified organism: Secondary | ICD-10-CM | POA: Diagnosis not present

## 2017-11-22 DIAGNOSIS — I5032 Chronic diastolic (congestive) heart failure: Secondary | ICD-10-CM | POA: Diagnosis not present

## 2017-11-22 NOTE — Telephone Encounter (Signed)
Received voicemail message from patient requesting a return call. Phoned her back. Patient reports she completed radiation on Tuesday but noticed a scant amount of clear fluid that dried on her treated breast. Patient confirms the drainage is not coming from her nipple. Explained skin changes associated with radiation therapy will continue for two weeks s/p therapy. Advised patient to continue to apply radiaplex or sonafine bid for the next two weeks. Explained the scant clear liquid in the area of skin breakdown is most likely lymph where her body is trying to heal. Patient denies fever, edema or breast pain. Encouraged patient to keep breast clean and moisturized. Attempted to reassure her this is a normal reaction. Patient verbalized understanding of all reviewed and expressed appreciation for the return call.

## 2017-11-22 NOTE — Progress Notes (Signed)
  Radiation Oncology         [phone] ________________________________  Name: [name] MRN: [id number]  Date: 11/20/2017  DOB: 1941-09-09  End of Treatment Note  Diagnosis:   77 y.o. female with Stage IIA, pT1cN1aM0 ER weakly positive, PR/HER2 negative, grade 2 invasive ductal carcinoma of the left breast     Indication for treatment:  Curative       Radiation treatment dates:   10/01/2017 - 11/20/2017  Site/dose:   The patient initially received a dose of 50.4 Gy in 28 fractions to the breast using whole-breast tangent fields. This was delivered using a 3-D conformal technique. The patient then received a boost to the seroma. This delivered an additional 10 Gy in 5 fractions using a 3-D technique. The total dose was 60.4 Gy.  Narrative: The patient tolerated radiation treatment relatively well.   The patient had some expected skin irritation as she progressed during treatment. Moist desquamation was not present at the end of treatment.  Plan: The patient has completed radiation treatment. The patient will return to radiation oncology clinic for routine followup in one month. I advised the patient to call or return sooner if they have any questions or concerns related to their recovery or treatment. ________________________________  Jodelle Gross, MD, PhD  This document serves as a record of services personally performed by Kyung Rudd, MD. It was created on his behalf by Rae Lips, a trained medical scribe. The creation of this record is based on the scribe's personal observations and the provider's statements to them. This document has been checked and approved by the attending provider.

## 2017-11-28 DIAGNOSIS — J189 Pneumonia, unspecified organism: Secondary | ICD-10-CM | POA: Diagnosis not present

## 2017-11-28 DIAGNOSIS — Z7951 Long term (current) use of inhaled steroids: Secondary | ICD-10-CM | POA: Diagnosis not present

## 2017-11-28 DIAGNOSIS — J440 Chronic obstructive pulmonary disease with acute lower respiratory infection: Secondary | ICD-10-CM | POA: Diagnosis not present

## 2017-11-28 DIAGNOSIS — I110 Hypertensive heart disease with heart failure: Secondary | ICD-10-CM | POA: Diagnosis not present

## 2017-11-28 DIAGNOSIS — K219 Gastro-esophageal reflux disease without esophagitis: Secondary | ICD-10-CM | POA: Diagnosis not present

## 2017-11-28 DIAGNOSIS — I5032 Chronic diastolic (congestive) heart failure: Secondary | ICD-10-CM | POA: Diagnosis not present

## 2017-11-28 DIAGNOSIS — R011 Cardiac murmur, unspecified: Secondary | ICD-10-CM | POA: Diagnosis not present

## 2017-12-03 IMAGING — US ULTRASOUND GUIDED BREAST CYST ASPIRATION
1 series · 4 of 4 positions shown · non-contrast
Comparison: Previous exams.

ADDENDUM:
Left breast aspiration yielded Gram Stain-RARE WBC PRESENT,
PREDOMINANTLY MONONUCLEAR, NO ORGANISMS SEEN. Culture-No growth
aerobically or anaerobically. The patient was notified of results in
person by Dr. Soren Htx on August 30, 2017. The patient reported
significant improvement after antibiotic therapy of the pain and
firmness. The patient returned to [REDACTED] on August 30, 2017 for a Right breast ultrasound to ensure
continued resolution of the seroma. This procedure is dictated in a
separate report. She was instructed to call for any additional
questions or concerns.

Pathology results reported by Maiara Chou, RN on 08/30/2017.
CLINICAL DATA: Status post breast conservation surgery in Friday July, 2017. Patient with a mildly complex fluid collection demonstrated
on ultrasound earlier today in the outer left breast presents now
for ultrasound-guided aspiration.
EXAM:
ULTRASOUND GUIDED LEFT BREAST CYST ASPIRATION

[Series 1: ultrasound guided breast cyst aspiration · 0.07mm/px · 4 of 4 slices shown]
[im 1/4]
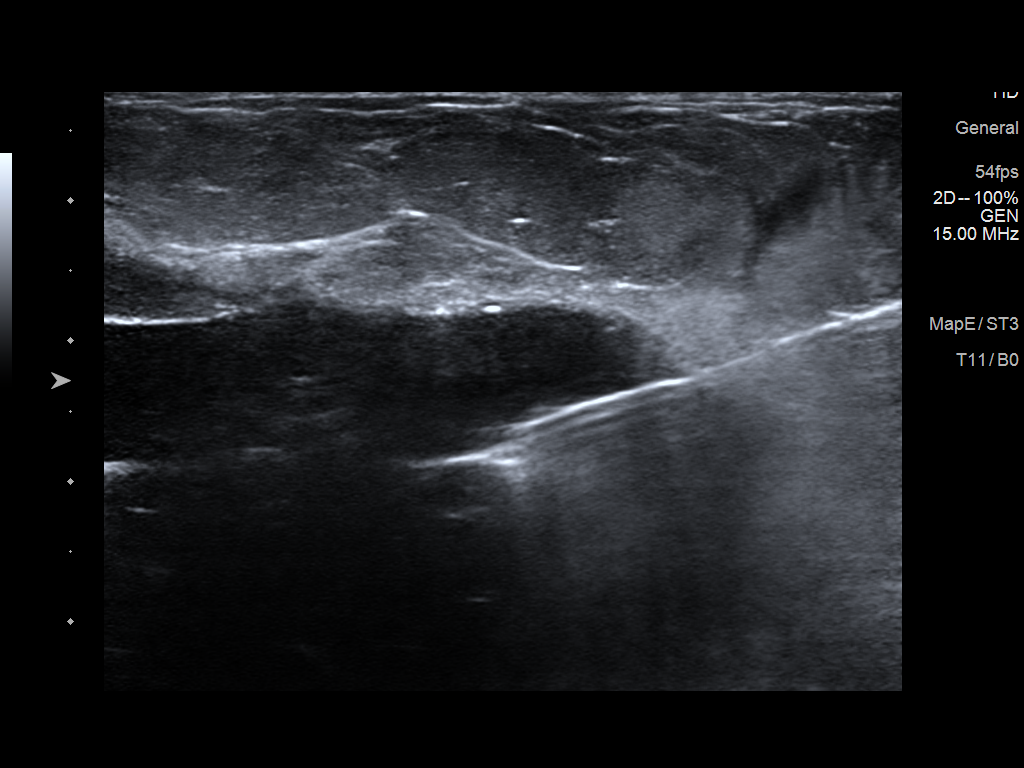
[im 2/4]
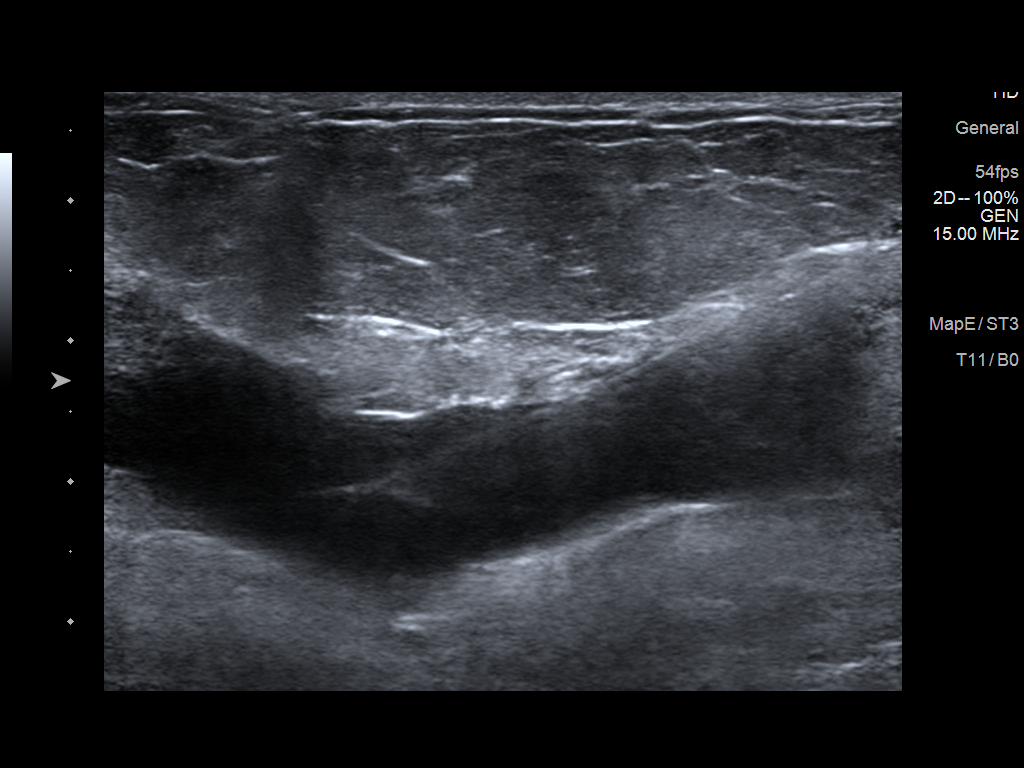
[im 3/4]
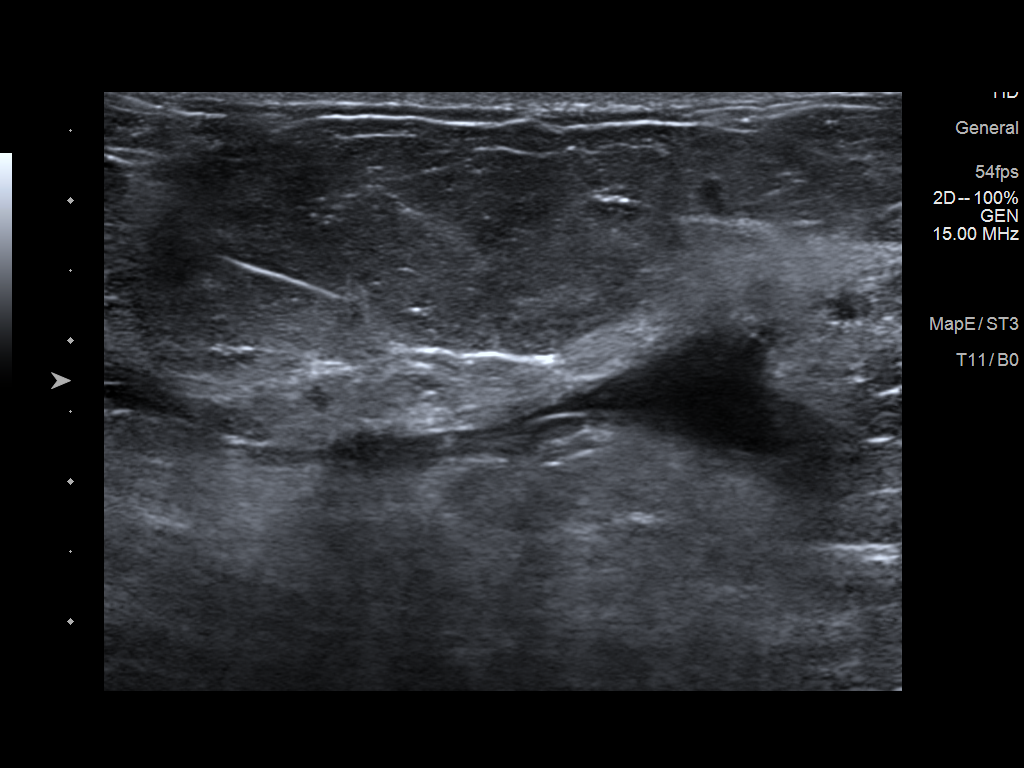
[im 4/4]
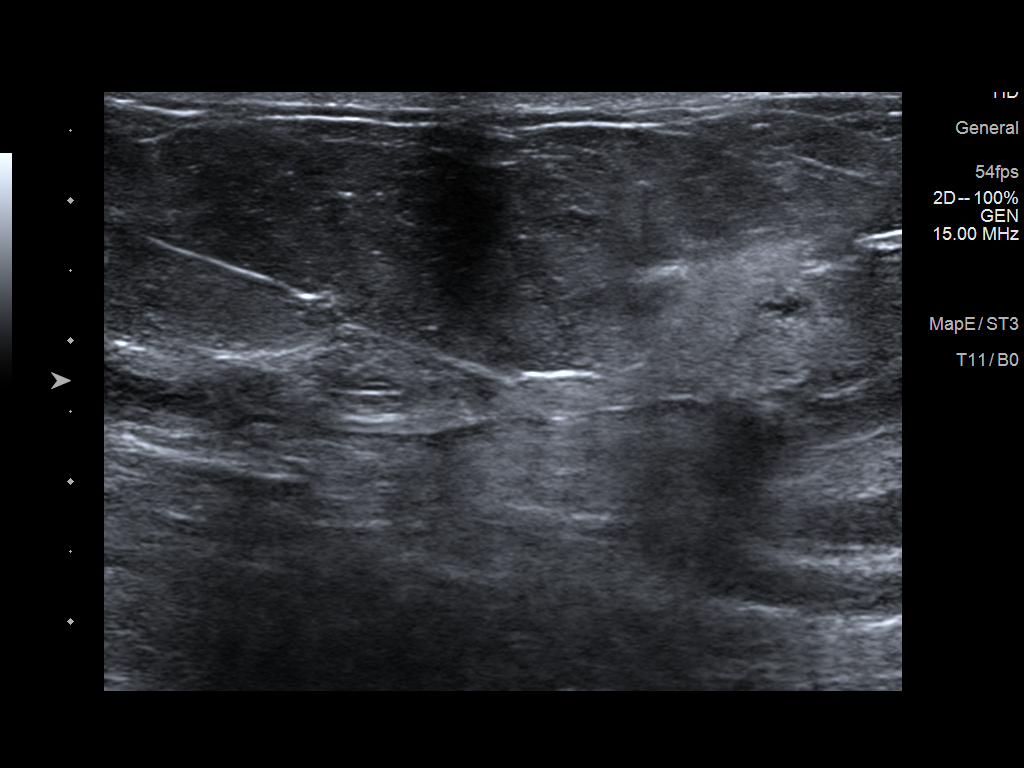

[4 of 4 positions shown; findings below may reference images not displayed]

PROCEDURE:
Using sterile technique, 1% lidocaine, under direct ultrasound
visualization, needle aspiration of the left breast fluid collection
was performed. 20 cc of serosanguineous fluid was sent to the
laboratory for culture and sensitivity. A total of 140 cc was
removed. The fluid collection was completely aspirated.
IMPRESSION: Ultrasound-guided aspiration of the large fluid collection in the
outer left breast. Collection was completely aspirated. 20 cc was
sent to the laboratory for culture and sensitivity. No apparent
complications.

RECOMMENDATIONS:
1. Ten day course of Augmentin was prescribed.
2. Patient will return for follow-up ultrasound in 7 days to exclude
reaccumulation. Patient was encouraged to return sooner if the
associated palpable finding returned.
Findings and plan discussed with Dr. Goriva at the conclusion of
the procedure.

## 2017-12-03 IMAGING — US ULTRASOUND LEFT BREAST LIMITED
1 series · 7 of 7 positions shown · non-contrast
Comparison: Previous exam(s).

CLINICAL DATA: Status post breast conservation surgery in Friday July, 2017. Patient now presents with a palpable mass in the outer left
breast and overlying erythema.

EXAM:
ULTRASOUND OF THE LEFT BREAST

[Series 1: ultrasound left breast limited · 0.09mm/px · 7 of 7 slices shown]
[im 1/7]
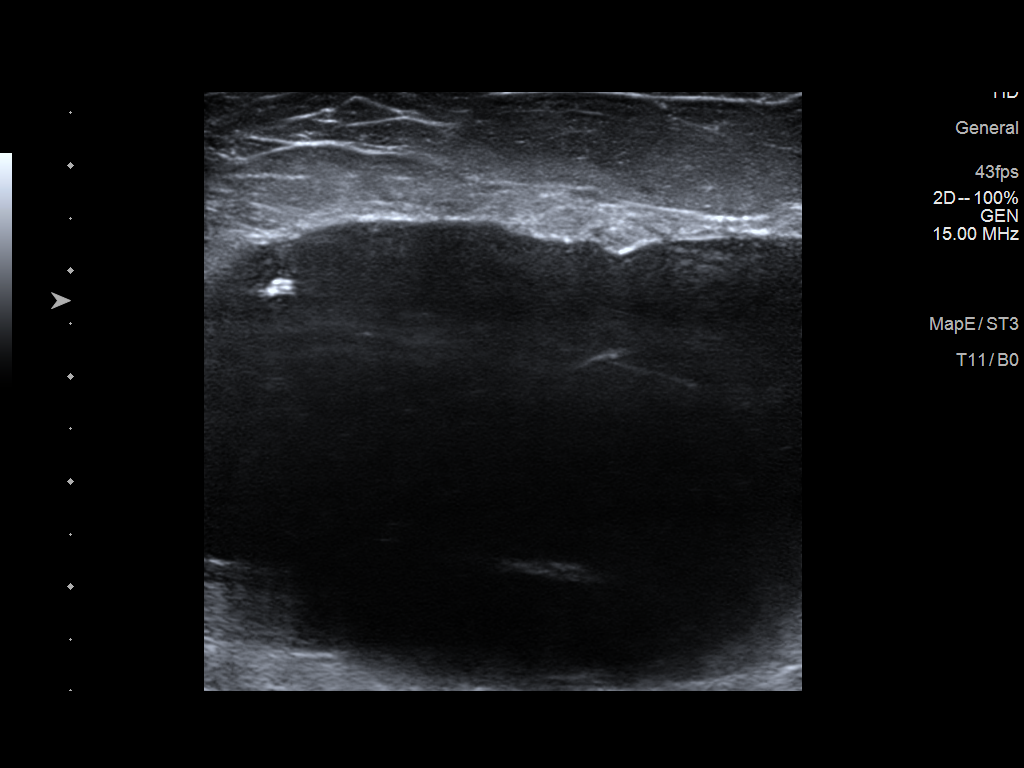
[im 2/7]
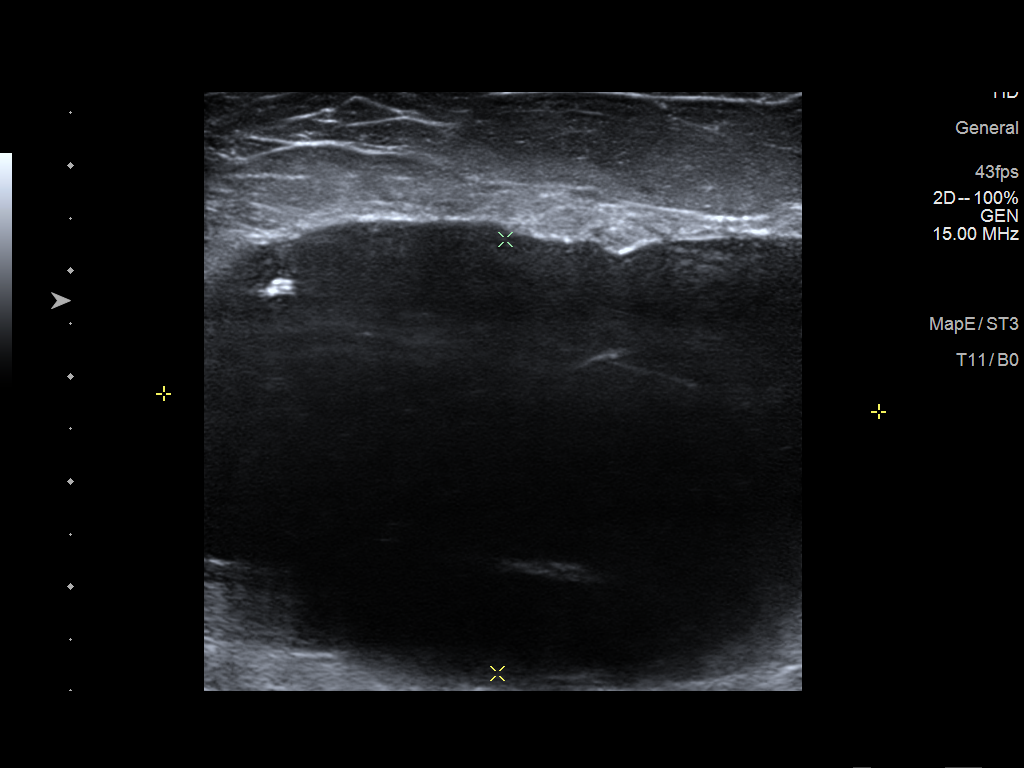
[im 3/7]
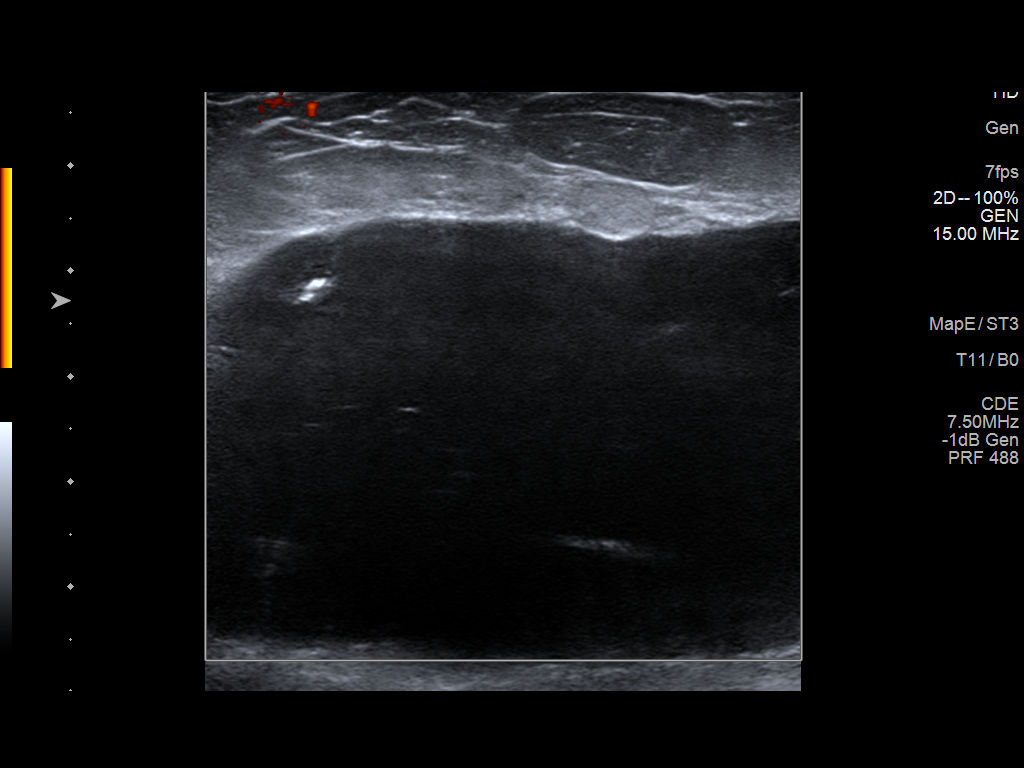
[im 4/7]
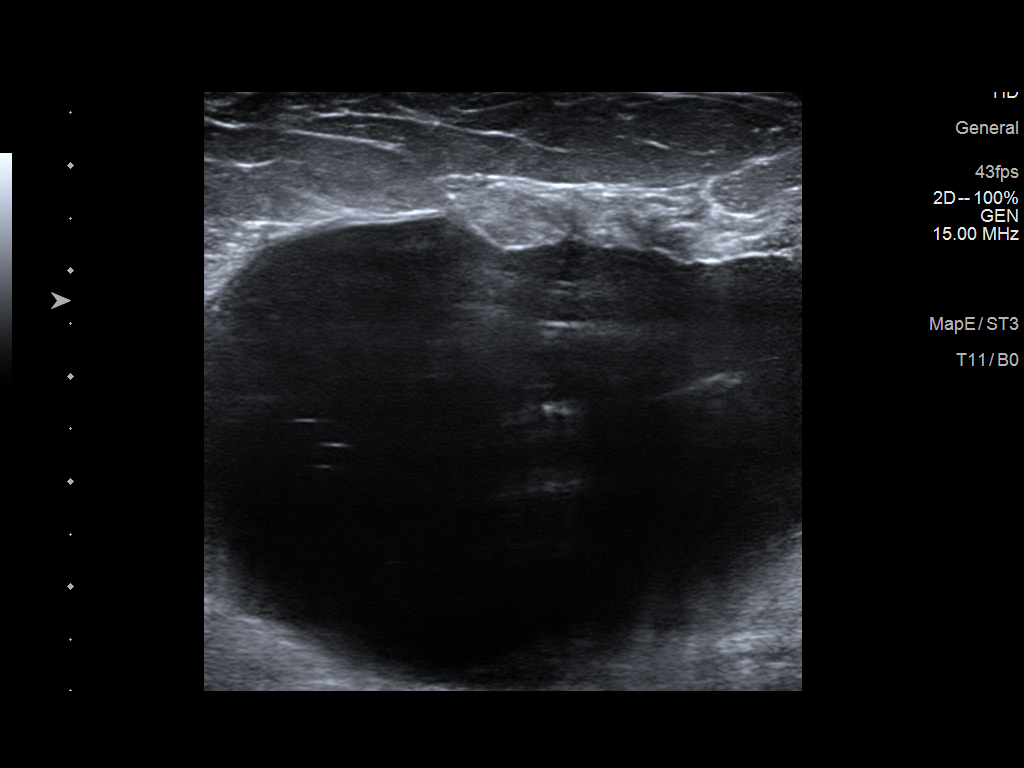
[im 5/7]
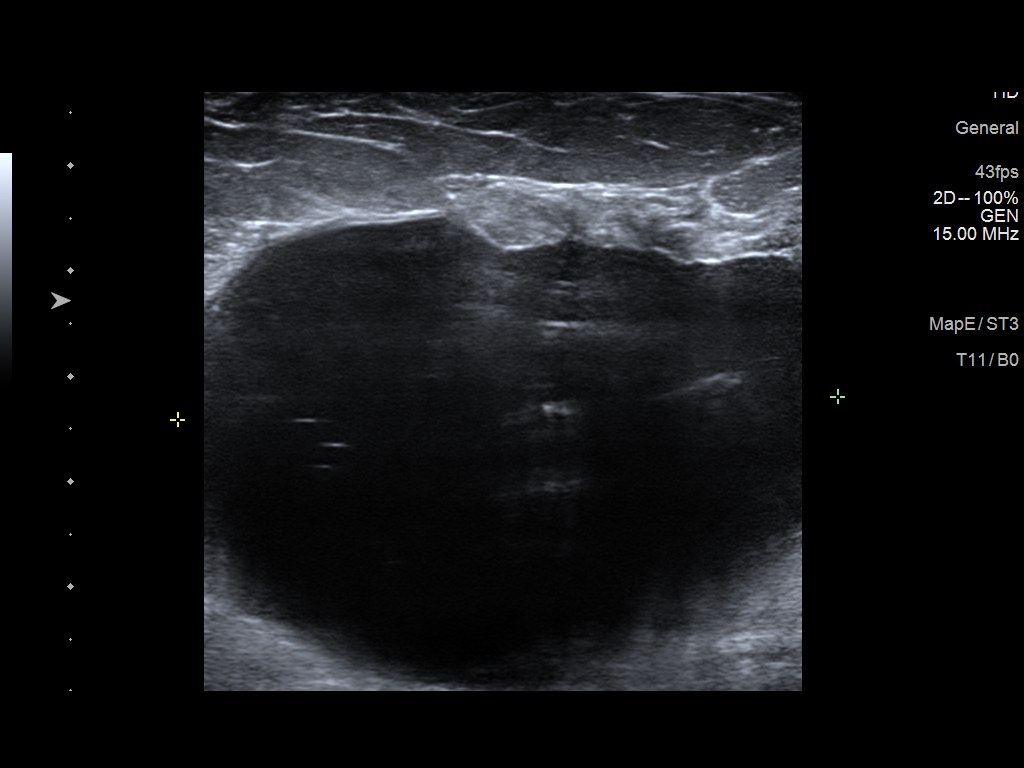
[im 6/7]
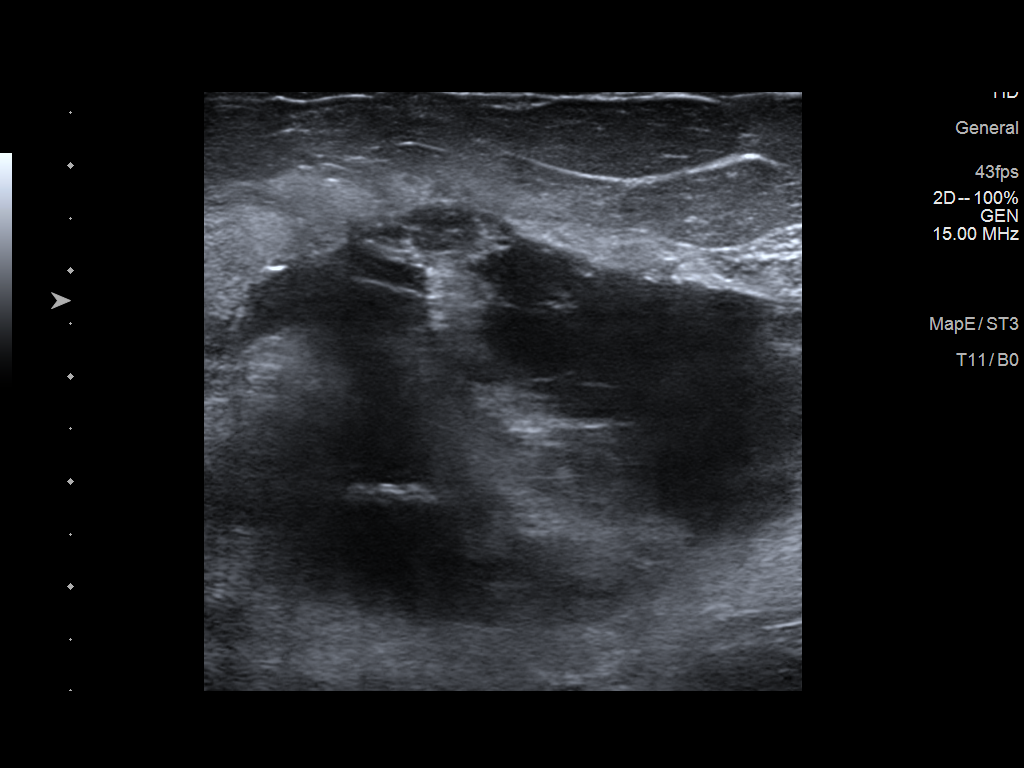
[im 7/7]
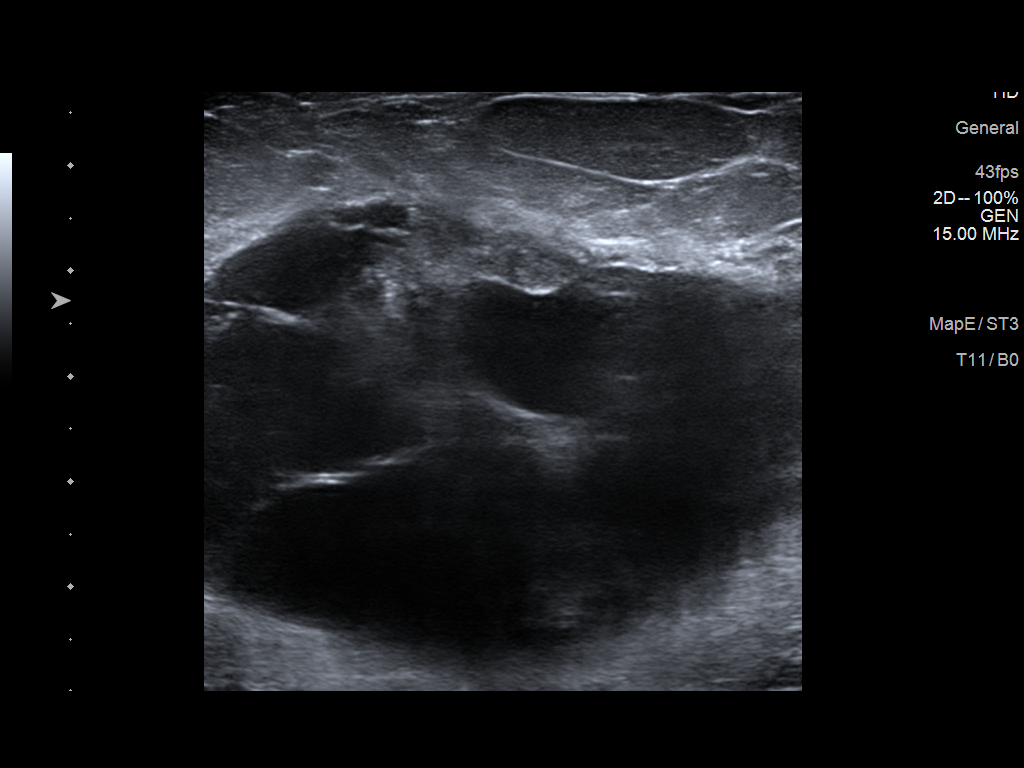

[7 of 7 positions shown; findings below may reference images not displayed]

FINDINGS: Targeted ultrasound is performed, showing a large mildly complex
fluid collection in the left breast at the 2:30 o'clock axis, 8 cm
from the nipple, measuring approximately 7 cm greatest dimension.
IMPRESSION: Mildly complex fluid collection in the left breast at the 2:30
o'clock axis, 8 cm from nipple, measuring approximately 7 cm,
corresponding to the area of clinical concern, suspected abscess.

RECOMMENDATION:
Ultrasound-guided aspiration of the fluid collection in the outer
left breast, presumed abscess.

I have discussed the findings and recommendations with the patient.
Results were also provided in writing at the conclusion of the
visit. If applicable, a reminder letter will be sent to the patient
regarding the next appointment.

BI-RADS CATEGORY  2: Benign.

## 2017-12-12 ENCOUNTER — Ambulatory Visit: Payer: Medicare Other | Admitting: Orthopedic Surgery

## 2017-12-13 DIAGNOSIS — J449 Chronic obstructive pulmonary disease, unspecified: Secondary | ICD-10-CM | POA: Diagnosis not present

## 2017-12-17 ENCOUNTER — Ambulatory Visit (HOSPITAL_COMMUNITY)
Admission: RE | Admit: 2017-12-17 | Discharge: 2017-12-17 | Disposition: A | Discharge status: Home / Self Care | Payer: Medicare Other | Source: Ambulatory Visit | Attending: Hematology and Oncology | Admitting: Hematology and Oncology

## 2017-12-17 DIAGNOSIS — Z13820 Encounter for screening for osteoporosis: Secondary | ICD-10-CM | POA: Diagnosis not present

## 2017-12-17 DIAGNOSIS — Z780 Asymptomatic menopausal state: Secondary | ICD-10-CM | POA: Insufficient documentation

## 2018-01-02 ENCOUNTER — Encounter: Payer: Self-pay | Admitting: Orthopedic Surgery

## 2018-01-02 ENCOUNTER — Ambulatory Visit (INDEPENDENT_AMBULATORY_CARE_PROVIDER_SITE_OTHER): Payer: Medicare Other

## 2018-01-02 ENCOUNTER — Ambulatory Visit: Payer: Medicare Other | Admitting: Orthopedic Surgery

## 2018-01-02 VITALS — BP 152/95 | HR 68 | Ht 64.0 in | Wt 272.0 lb

## 2018-01-02 DIAGNOSIS — I5032 Chronic diastolic (congestive) heart failure: Secondary | ICD-10-CM | POA: Diagnosis not present

## 2018-01-02 DIAGNOSIS — M12811 Other specific arthropathies, not elsewhere classified, right shoulder: Secondary | ICD-10-CM

## 2018-01-02 DIAGNOSIS — M25511 Pain in right shoulder: Secondary | ICD-10-CM

## 2018-01-02 DIAGNOSIS — J9601 Acute respiratory failure with hypoxia: Secondary | ICD-10-CM | POA: Diagnosis not present

## 2018-01-02 DIAGNOSIS — Z79899 Other long term (current) drug therapy: Secondary | ICD-10-CM | POA: Diagnosis not present

## 2018-01-02 NOTE — Progress Notes (Signed)
Patient ID: [name], female   DOB: May 05, 1941, 77 y.o.   MRN: [id number]  Chief Complaint  Patient presents with  . Shoulder Pain    Right shoulder pain, no injury.    HPI [name] is a 77 y.o. female.   [place] evaluation of right shoulder.  She complains of pain right shoulder for about a year increasing over the last month associated with intermittent dull aching pain radiates from the right shoulder to the right biceps.   she does not complain of weakness tenderness or numbness    Review of Systems Review of Systems  Constitutional: Negative for chills, fever and weight loss.  Respiratory: Negative for shortness of breath.   Cardiovascular: Negative for chest pain.  Neurological: Negative for tingling.    Past Medical History:  Diagnosis Date  . Anemia 04/02/2015  . Arthritis   . Atypical glandular cells on vaginal Papanicolaou smear 04/12/2016   Well get colpo and biopsy with JVF  . Bronchitis, chronic obstructive ([place]) since 2000's  . Cancer [facility])    dx with endometrial cancer   . Dysrhythmia   . GERD (gastroesophageal reflux disease)   . Gout   . Heart murmur   . Hiatal hernia   . Hiatal hernia 11/19/2012  . History of endometrial cancer 04/06/2016   Sp hysterectomy 2012  . Hypertension   . Ischemic colitis ([place])    03/2015  . Myeloproliferative disorder, JAK-2 positive 01/05/2014  . Obesity   . Obesity, morbid (more than 100 lbs over ideal weight or BMI > 40) (HCC) 08/2011   Ht 5'7", wt 280 lb  . Pneumonia 09/2014  . Polycythemia vera(238.4) 01/18/2014  . PONV (postoperative nausea and vomiting)    post-op hypoxic resp failure 09/2011 and require Bi-Pap (possible r/t - pressure pulm edema vs asp PNA, COPD exacerbation, obesity hypoventilation  . Pulmonary hypertension ([place])   . Shortness of breath    unable to lay flat due to dyspnea  . Vaginal Pap smear, abnormal     Past Surgical History:  Procedure Laterality Date  .  ABDOMINAL HYSTERECTOMY  09/12/2011   Procedure: HYSTERECTOMY ABDOMINAL;  Surgeon: Lori Morning, MD;  [facility];  Service: Gynecology;  Laterality: N/A;  Total Abdominal Hysterectomy, Bilateral Salpingo Oophorectomy  . BREAST LUMPECTOMY Left 07/10/2017   LEFT BREAST LUMPECTOMY WITH RADIOACTIVE SEED AND SENTINEL LYMPH NODE BIOPSY   . BREAST LUMPECTOMY WITH RADIOACTIVE SEED AND SENTINEL LYMPH NODE BIOPSY Left 07/10/2017   Procedure: LEFT BREAST LUMPECTOMY WITH RADIOACTIVE SEED AND SENTINEL LYMPH NODE BIOPSY;  Surgeon: Lori Bookbinder, MD;  Location: [facility];  Service: General;  Laterality: Left;  . COLONOSCOPY  07/05/2012   Procedure: COLONOSCOPY;  Surgeon: Lori Houston, MD;  [facility];  Service: Endoscopy;  Laterality: N/A;  730  . EYE SURGERY     bilateral 8 - 12 yrs ago  . FLEXIBLE SIGMOIDOSCOPY N/A 04/03/2015   Procedure: FLEXIBLE SIGMOIDOSCOPY;  Surgeon: Lori Houston, MD;  [facility];  Service: Endoscopy;  Laterality: N/A;  . HYSTEROSCOPY W/D&C  08/15/2011   Procedure: DILATATION AND CURETTAGE (D&C) /HYSTEROSCOPY;  Surgeon: Lori Kind, MD;  [facility];  Service: Gynecology;  Laterality: N/A;  With Suction Curette  . JOINT REPLACEMENT     left knee replaced 2014  . KNEE ARTHROPLASTY Left 03/15/2015   Procedure: COMPUTER ASSISTED TOTAL KNEE ARTHROPLASTY;  Surgeon: Lori Killings, MD;  Location: [place];  Service: Orthopedics;  Laterality: Left;  . SALPINGOOPHORECTOMY  09/12/2011   Procedure: SALPINGO OOPHERECTOMY;  Surgeon: Lori Morning, MD;  [facility];  Service: Gynecology;  Laterality: Bilateral;    Family History  Problem Relation Age of Onset  . Other Mother   . Lung cancer Father   . Hypertension Son   . Anesthesia problems Neg Hx   . Hypotension Neg Hx   . Malignant hyperthermia Neg Hx   . Pseudochol deficiency Neg Hx      Social History   Tobacco Use  . Smoking status: Never Smoker  . Smokeless tobacco: Never Used   Substance Use Topics  . Alcohol use: No  . Drug use: No    Allergies  Allergen Reactions  . Carbamazepine Hives and Other (See Comments)    headache  . Sulfa Antibiotics     Rash    Allergies  Allergen Reactions  . Carbamazepine Hives and Other (See Comments)    headache  . Sulfa Antibiotics     Rash     No outpatient medications have been marked as taking for the 01/02/18 encounter (Office Visit) with Carole Civil, MD.    Physical Exam BP (!) 152/95   Pulse 68   Ht 5\' 4"  (1.626 m)   Wt 272 lb (123.4 kg)   BMI 46.69 kg/m  Physical Exam  Constitutional: She is oriented to person, place, and time. She appears well-developed and well-nourished.  Neurological: She is alert and oriented to person, place, and time.  Psychiatric: She has a normal mood and affect. Judgment normal.  Vitals reviewed.  Ambulatory status normal with no assistive devices Right Shoulder Exam   Tenderness  The patient is experiencing tenderness in the acromion and biceps tendon.  Range of Motion  Active abduction: normal  Passive abduction: normal  Extension: normal  External rotation: normal  Forward flexion: normal   Muscle Strength  Abduction: 4/5  Internal rotation: 5/5  External rotation: 5/5  Supraspinatus: 4/5  Subscapularis: 5/5  Biceps: 5/5   Tests  Apprehension: negative Impingement: positive Drop arm: negative Sulcus: absent  Other  Erythema: absent Scars: absent Sensation: normal Pulse: present   Left Shoulder Exam  Left shoulder exam is normal.  Tenderness  The patient is experiencing no tenderness.   Range of Motion  The patient has normal left shoulder ROM.  Muscle Strength  The patient has normal left shoulder strength.  Tests  Apprehension: negative Hawkins test: negative Cross arm: negative Impingement: negative Drop arm: negative Sulcus: absent  Other  Erythema: absent Scars: absent Sensation: normal Pulse: present        Data Reviewed Imaging of the RIGHT shoulder are independently reviewed and I interpreted these as [place]   Assessment  Encounter Diagnoses  Name Primary?  . Pain in joint of right shoulder   . Rotator cuff arthropathy of right shoulder Yes     Plan  SUBACROMIAL INJECTION  HOME EXERCISES THEN PT  TRAMADOL PRN   FU PRN    Procedure note the subacromial injection shoulder RIGHT  Verbal consent was obtained to inject the  RIGHT   Shoulder  Timeout was completed to confirm the injection site is a subacromial space of the  RIGHT  shoulder   Medication used Depo-Medrol 40 mg and lidocaine 1% 3 cc  Anesthesia was provided by ethyl chloride  The injection was performed in the RIGHT  posterior subacromial space. After pinning the skin with alcohol and anesthetized the  skin with ethyl chloride the subacromial space was injected using a 20-gauge needle. There were no complications  Sterile dressing was applied.

## 2018-01-02 NOTE — Patient Instructions (Addendum)

## 2018-01-03 ENCOUNTER — Telehealth: Payer: Self-pay | Admitting: Radiology

## 2018-01-03 ENCOUNTER — Telehealth: Payer: Self-pay | Admitting: Orthopedic Surgery

## 2018-01-03 ENCOUNTER — Other Ambulatory Visit: Payer: Self-pay | Admitting: Orthopedic Surgery

## 2018-01-03 MED ORDER — TRAMADOL HCL 50 MG PO TABS: 50.0000 mg | ORAL_TABLET | Freq: Four times a day (QID) | ORAL | 5 refills | Status: DC | PRN

## 2018-01-03 NOTE — Progress Notes (Signed)
Data bank checked

## 2018-01-03 NOTE — Telephone Encounter (Signed)
done

## 2018-01-03 NOTE — Telephone Encounter (Signed)
Pharmacy can not fill tramadol for full quantity since she has never used it before, they have changed to 5 day supply #20.   To you FYI

## 2018-01-03 NOTE — Telephone Encounter (Signed)
Patient called saying she thought a prescription for Tramadol was being sent to her pharmacy, but it wasn't.  She is requesting a prescription for Tramadol.  SHE USES [facility]

## 2018-01-10 DIAGNOSIS — J449 Chronic obstructive pulmonary disease, unspecified: Secondary | ICD-10-CM | POA: Diagnosis not present

## 2018-01-11 DIAGNOSIS — I5032 Chronic diastolic (congestive) heart failure: Secondary | ICD-10-CM | POA: Diagnosis not present

## 2018-01-11 DIAGNOSIS — J9601 Acute respiratory failure with hypoxia: Secondary | ICD-10-CM | POA: Diagnosis not present

## 2018-01-11 DIAGNOSIS — I700 Atherosclerosis of aorta: Secondary | ICD-10-CM | POA: Diagnosis not present

## 2018-02-01 DIAGNOSIS — J9611 Chronic respiratory failure with hypoxia: Secondary | ICD-10-CM | POA: Diagnosis not present

## 2018-02-01 DIAGNOSIS — I509 Heart failure, unspecified: Secondary | ICD-10-CM | POA: Diagnosis not present

## 2018-02-01 DIAGNOSIS — J449 Chronic obstructive pulmonary disease, unspecified: Secondary | ICD-10-CM | POA: Diagnosis not present

## 2018-02-01 DIAGNOSIS — I10 Essential (primary) hypertension: Secondary | ICD-10-CM | POA: Diagnosis not present

## 2018-02-10 DIAGNOSIS — J449 Chronic obstructive pulmonary disease, unspecified: Secondary | ICD-10-CM | POA: Diagnosis not present

## 2018-02-11 ENCOUNTER — Encounter: Payer: Self-pay | Admitting: Obstetrics and Gynecology

## 2018-02-11 ENCOUNTER — Other Ambulatory Visit: Payer: Self-pay

## 2018-02-11 ENCOUNTER — Ambulatory Visit: Payer: Medicare Other | Admitting: Obstetrics and Gynecology

## 2018-02-11 VITALS — BP 120/64 | HR 71 | Ht 64.0 in | Wt 270.0 lb

## 2018-02-11 DIAGNOSIS — Z08 Encounter for follow-up examination after completed treatment for malignant neoplasm: Secondary | ICD-10-CM

## 2018-02-11 DIAGNOSIS — Z8542 Personal history of malignant neoplasm of other parts of uterus: Secondary | ICD-10-CM | POA: Diagnosis not present

## 2018-02-11 DIAGNOSIS — Z01411 Encounter for gynecological examination (general) (routine) with abnormal findings: Secondary | ICD-10-CM | POA: Diagnosis not present

## 2018-02-11 NOTE — Progress Notes (Signed)
Assessment:  Annual Gyn Exam S/p endometrial cancer 2010 staging 1 a grade 1 Plan:   1. return annually or prn 3    Annual mammogram advised after age 77 Subjective:  [name] is a 77 y.o. female G5P0005 who presents for annual exam. No LMP recorded. Patient has had a hysterectomy.  With one A grade 1 endometrial cancer she has had 7 years of negative surveillance the patient has complaints today of none.  She is now 10 months status post left breast cancer status post radiation therapy did not require chemotherapy.  She is on letrozole with a 5% or less recurrence rate  The following portions of the patient's history were reviewed and updated as appropriate: allergies, current medications, past family history, past medical history, past social history, past surgical history and problem list. Past Medical History:  Diagnosis Date  . Anemia 04/02/2015  . Arthritis   . Atypical glandular cells on vaginal Papanicolaou smear 04/12/2016   Well get colpo and biopsy with JVF  . Bronchitis, chronic obstructive ([place]) since 2000's  . Cancer [facility])    dx with endometrial cancer   . Dysrhythmia   . GERD (gastroesophageal reflux disease)   . Gout   . Heart murmur   . Hiatal hernia   . Hiatal hernia 11/19/2012  . History of endometrial cancer 04/06/2016   Sp hysterectomy 2012  . Hypertension   . Ischemic colitis ([place])    03/2015  . Myeloproliferative disorder, JAK-2 positive 01/05/2014  . Obesity   . Obesity, morbid (more than 100 lbs over ideal weight or BMI > 40) (HCC) 08/2011   Ht 5'7", wt 280 lb  . Pneumonia 09/2014  . Polycythemia vera(238.4) 01/18/2014  . PONV (postoperative nausea and vomiting)    post-op hypoxic resp failure 09/2011 and require Bi-Pap (possible r/t - pressure pulm edema vs asp PNA, COPD exacerbation, obesity hypoventilation  . Pulmonary hypertension ([place])   . Shortness of breath    unable to lay flat due to dyspnea  . Vaginal Pap smear, abnormal     Past  Surgical History:  Procedure Laterality Date  . ABDOMINAL HYSTERECTOMY  09/12/2011   Procedure: HYSTERECTOMY ABDOMINAL;  Surgeon: Janie Morning, MD;  [facility];  Service: Gynecology;  Laterality: N/A;  Total Abdominal Hysterectomy, Bilateral Salpingo Oophorectomy  . BREAST LUMPECTOMY Left 07/10/2017   LEFT BREAST LUMPECTOMY WITH RADIOACTIVE SEED AND SENTINEL LYMPH NODE BIOPSY   . BREAST LUMPECTOMY WITH RADIOACTIVE SEED AND SENTINEL LYMPH NODE BIOPSY Left 07/10/2017   Procedure: LEFT BREAST LUMPECTOMY WITH RADIOACTIVE SEED AND SENTINEL LYMPH NODE BIOPSY;  Surgeon: Rolm Bookbinder, MD;  Location: [place];  Service: General;  Laterality: Left;  . COLONOSCOPY  07/05/2012   Procedure: COLONOSCOPY;  Surgeon: Rogene Houston, MD;  [facility];  Service: Endoscopy;  Laterality: N/A;  730  . EYE SURGERY     bilateral 8 - 12 yrs ago  . FLEXIBLE SIGMOIDOSCOPY N/A 04/03/2015   Procedure: FLEXIBLE SIGMOIDOSCOPY;  Surgeon: Rogene Houston, MD;  [facility];  Service: Endoscopy;  Laterality: N/A;  . HYSTEROSCOPY W/D&C  08/15/2011   Procedure: DILATATION AND CURETTAGE (D&C) /HYSTEROSCOPY;  Surgeon: Jonnie Kind, MD;  [facility];  Service: Gynecology;  Laterality: N/A;  With Suction Curette  . JOINT REPLACEMENT     left knee replaced 2014  . KNEE ARTHROPLASTY Left 03/15/2015   Procedure: COMPUTER ASSISTED TOTAL KNEE ARTHROPLASTY;  Surgeon: Marybelle Killings, MD;  Location: [place];  Service: Orthopedics;  Laterality: Left;  . SALPINGOOPHORECTOMY  09/12/2011   Procedure: SALPINGO OOPHERECTOMY;  Surgeon: Janie Morning, MD;  [facility];  Service: Gynecology;  Laterality: Bilateral;     Current Outpatient Medications:  .  albuterol (PROVENTIL HFA;VENTOLIN HFA) 108 (90 Base) MCG/ACT inhaler, Inhale 2 puffs into the lungs every 6 (six) hours as needed for wheezing or shortness of breath. Reported on 02/07/2016, Disp: 1 Inhaler, Rfl: 0 .  aspirin EC 81 MG tablet, Take 81 mg by mouth  at bedtime. , Disp: , Rfl:  .  atenolol (TENORMIN) 50 MG tablet, Take 25 mg by mouth 2 (two) times daily. , Disp: , Rfl:  .  cloNIDine (CATAPRES) 0.3 MG tablet, Take 0.15 mg by mouth 2 (two) times daily. , Disp: , Rfl:  .  esomeprazole (NEXIUM) 40 MG capsule, Take 1 capsule (40 mg total) by mouth every morning., Disp: 30 capsule, Rfl: 11 .  ferrous sulfate 325 (65 FE) MG EC tablet, Take 325 mg by mouth daily., Disp: , Rfl:  .  furosemide (LASIX) 20 MG tablet, , Disp: , Rfl: 12 .  hydroxyurea (HYDREA) 500 MG capsule, TAKE TWO CAPSULES BY MOUTH ONCE DAILY, Disp: 180 capsule, Rfl: 1 .  letrozole (FEMARA) 2.5 MG tablet, Take 1 tablet (2.5 mg total) by mouth daily., Disp: 90 tablet, Rfl: 3 .  losartan (COZAAR) 100 MG tablet, Take 100 mg by mouth every evening. , Disp: , Rfl:  .  traMADol (ULTRAM) 50 MG tablet, Take 1 tablet (50 mg total) by mouth every 6 (six) hours as needed., Disp: 60 tablet, Rfl: 5 .  diclofenac (VOLTAREN) 75 MG EC tablet, Take 1 tablet (75 mg total) by mouth 2 (two) times daily. (Patient not taking: Reported on 09/18/2017), Disp: 60 tablet, Rfl: 3 .  diltiazem (CARDIZEM CD) 180 MG 24 hr capsule, Take 180 mg by mouth daily. , Disp: , Rfl:  .  furosemide (LASIX) 20 MG tablet, Take 2 tablets (40 mg total) by mouth daily., Disp: 60 tablet, Rfl: 0 .  hyaluronate sodium (RADIAPLEXRX) GEL, Apply 1 application topically 2 (two) times daily. Apply  To skin after rad tx and bedtime, nothing 4 hours prior to rad tx, Disp: , Rfl:  .  non-metallic deodorant (ALRA) MISC, Apply 1 application topically. Apply after rad tx daily and as needed,, nothing prior to rad tx, Disp: , Rfl:  .  Simethicone (GAS RELIEF PO), Take 1 tablet by mouth daily. , Disp: , Rfl:  .  simvastatin (ZOCOR) 10 MG tablet, Take 10 mg by mouth every evening. , Disp: , Rfl:   Review of Systems Constitutional: negative, Obesity persists Gastrointestinal: negative Genitourinary: No urinary tract problems colonoscopy 2017 or  2018  Objective:  BP 120/64 (BP Location: Right Arm, Patient Position: Sitting, Cuff Size: Large)   Pulse 71   Ht 5\' 4"  (1.626 m)   Wt 270 lb (122.5 kg)   BMI 46.35 kg/m    BMI: Body mass index is 46.35 kg/m.  General Appearance: Alert, appropriate appearance for age. No acute distress HEENT: Grossly normal Neck / Thyroid:  Cardiovascular: RRR; normal S1, S2, no murmur Lungs: CTA bilaterally Back: No CVAT Breast Exam: No masses or nodes.No dimpling, nipple retraction or discharge. Gastrointestinal: Soft, non-tender, no masses or organomegaly Pelvic Exam: External genitalia: normal general appearance Vaginal: normal mucosa without prolapse or lesions and atrophic mucosa Cervix: removed surgically Adnexa: removed surgically Rectovaginal: normal rectal, no masses and guaiac negative stool obtained Lymphatic Exam: Non-palpable nodes  in neck, clavicular, axillary, or inguinal regions Skin: no rash or abnormalities Neurologic: Normal gait and speech, no tremor  Psychiatric: Alert and oriented, appropriate affect.  Urinalysis:Not done  Mallory Shirk. MD Pgr [phone] 9:19 AM

## 2018-02-19 IMAGING — DX DG CHEST 1V PORT
1 series · 1 of 1 positions shown · non-contrast
Comparison: 07/11/2017

CLINICAL DATA: Hypoxia and shortness of breath. History breast
cancer.

EXAM:
PORTABLE CHEST 1 VIEW

[chest ap]
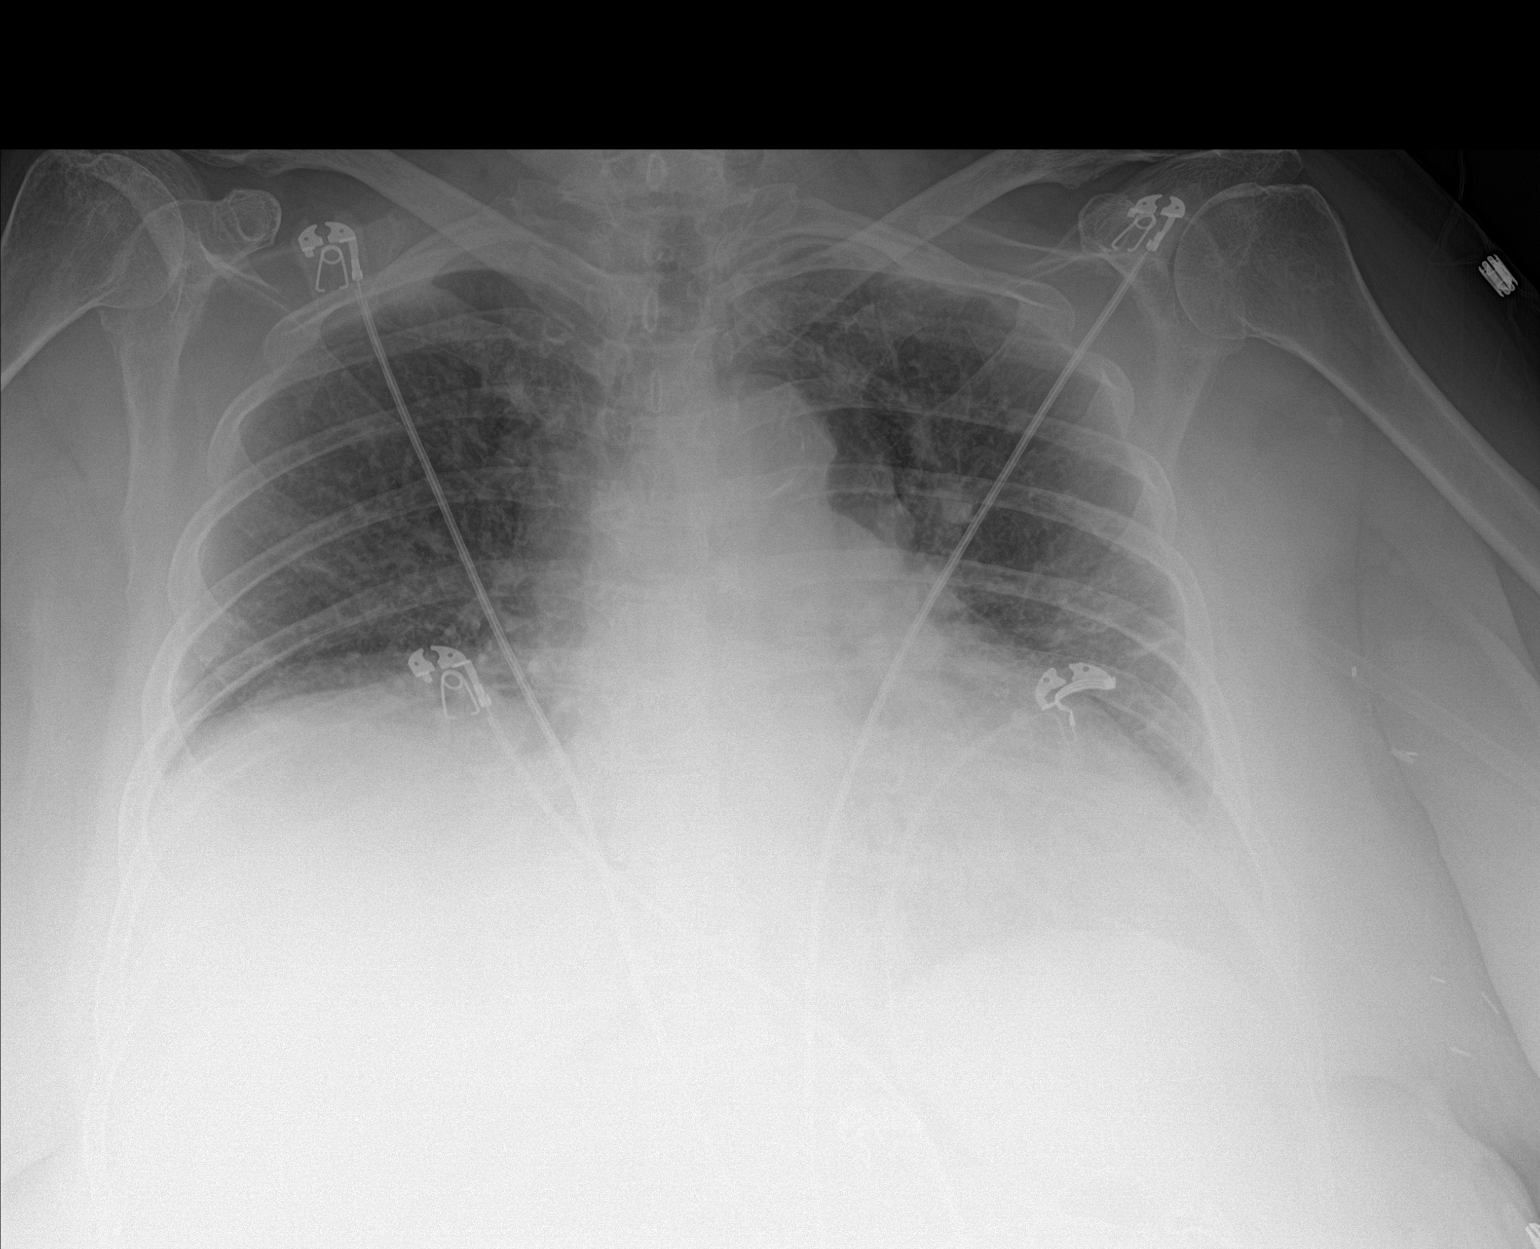

[1 of 1 positions shown; findings below may reference images not displayed]

FINDINGS: Chronic cardiomegaly and vascular pedicle widening. Vascular
congestion similar to prior. Chronic asymmetric elevation of the
left diaphragm. No Kerley lines, effusion, pneumothorax, or air
bronchograms.
IMPRESSION: 1. Cardiomegaly and vascular congestion.
2. Chronic low lung volumes with asymmetric right diaphragm
elevation.

## 2018-02-21 DIAGNOSIS — J189 Pneumonia, unspecified organism: Secondary | ICD-10-CM | POA: Diagnosis not present

## 2018-02-21 DIAGNOSIS — J449 Chronic obstructive pulmonary disease, unspecified: Secondary | ICD-10-CM | POA: Diagnosis not present

## 2018-02-21 DIAGNOSIS — R05 Cough: Secondary | ICD-10-CM | POA: Diagnosis not present

## 2018-03-12 DIAGNOSIS — J449 Chronic obstructive pulmonary disease, unspecified: Secondary | ICD-10-CM | POA: Diagnosis not present

## 2018-03-22 ENCOUNTER — Telehealth: Payer: Self-pay

## 2018-03-22 ENCOUNTER — Inpatient Hospital Stay (HOSPITAL_BASED_OUTPATIENT_CLINIC_OR_DEPARTMENT_OTHER): Payer: Medicare Other | Admitting: Adult Health

## 2018-03-22 ENCOUNTER — Encounter: Payer: Self-pay | Admitting: Adult Health

## 2018-03-22 ENCOUNTER — Inpatient Hospital Stay: Payer: Medicare Other | Attending: Hematology and Oncology

## 2018-03-22 ENCOUNTER — Telehealth: Payer: Self-pay | Admitting: Oncology

## 2018-03-22 VITALS — BP 150/64 | HR 72 | Temp 98.3°F | Resp 17 | Ht 64.0 in | Wt 270.0 lb

## 2018-03-22 DIAGNOSIS — Z170 Estrogen receptor positive status [ER+]: Secondary | ICD-10-CM | POA: Diagnosis not present

## 2018-03-22 DIAGNOSIS — Z79811 Long term (current) use of aromatase inhibitors: Secondary | ICD-10-CM | POA: Diagnosis not present

## 2018-03-22 DIAGNOSIS — Z79899 Other long term (current) drug therapy: Secondary | ICD-10-CM | POA: Diagnosis not present

## 2018-03-22 DIAGNOSIS — Z7982 Long term (current) use of aspirin: Secondary | ICD-10-CM | POA: Insufficient documentation

## 2018-03-22 DIAGNOSIS — C50412 Malignant neoplasm of upper-outer quadrant of left female breast: Secondary | ICD-10-CM | POA: Diagnosis not present

## 2018-03-22 DIAGNOSIS — Z923 Personal history of irradiation: Secondary | ICD-10-CM | POA: Diagnosis not present

## 2018-03-22 LAB — CBC WITH DIFFERENTIAL (CANCER CENTER ONLY)
BASOS PCT: 1 %
Basophils Absolute: 0.1 10*3/uL (ref 0.0–0.1)
EOS PCT: 5 %
Eosinophils Absolute: 0.4 10*3/uL (ref 0.0–0.5)
HCT: 48 % — ABNORMAL HIGH (ref 34.8–46.6)
Hemoglobin: 15.7 g/dL (ref 11.6–15.9)
LYMPHS ABS: 1.6 10*3/uL (ref 0.9–3.3)
Lymphocytes Relative: 22 %
MCH: 29.9 pg (ref 25.1–34.0)
MCHC: 32.7 g/dL (ref 31.5–36.0)
MCV: 91.4 fL (ref 79.5–101.0)
MONOS PCT: 9 %
Monocytes Absolute: 0.7 10*3/uL (ref 0.1–0.9)
Neutro Abs: 4.7 10*3/uL (ref 1.5–6.5)
Neutrophils Relative %: 63 %
PLATELETS: 395 10*3/uL (ref 145–400)
RBC: 5.25 MIL/uL (ref 3.70–5.45)
RDW: 15 % — ABNORMAL HIGH (ref 11.2–14.5)
WBC: 7.4 10*3/uL (ref 3.9–10.3)

## 2018-03-22 NOTE — Telephone Encounter (Signed)
Returned pt call to confirm appt's today.  Cyndia Bent RN

## 2018-03-22 NOTE — Patient Instructions (Signed)
Bone Health Bones protect organs, store calcium, and anchor muscles. Good health habits, such as eating nutritious foods and exercising regularly, are important for maintaining healthy bones. They can also help to prevent a condition that causes bones to lose density and become weak and brittle (osteoporosis). Why is bone mass important? Bone mass refers to the amount of bone tissue that you have. The higher your bone mass, the stronger your bones. An important step toward having healthy bones throughout life is to have strong and dense bones during childhood. A young adult who has a high bone mass is more likely to have a high bone mass later in life. Bone mass at its greatest it is called peak bone mass. A large decline in bone mass occurs in older adults. In women, it occurs about the time of menopause. During this time, it is important to practice good health habits, because if more bone is lost than what is replaced, the bones will become less healthy and more likely to break (fracture). If you find that you have a low bone mass, you may be able to prevent osteoporosis or further bone loss by changing your diet and lifestyle. How can I find out if my bone mass is low? Bone mass can be measured with an X-ray test that is called a bone mineral density (BMD) test. This test is recommended for all women who are age 65 or older. It may also be recommended for men who are age 70 or older, or for people who are more likely to develop osteoporosis due to:  Having bones that break easily.  Having a long-term disease that weakens bones, such as kidney disease or rheumatoid arthritis.  Having menopause earlier than normal.  Taking medicine that weakens bones, such as steroids, thyroid hormones, or hormone treatment for breast cancer or prostate cancer.  Smoking.  Drinking three or more alcoholic drinks each day.  What are the nutritional recommendations for healthy bones? To have healthy bones, you  need to get enough of the right minerals and vitamins. Most nutrition experts recommend getting these nutrients from the foods that you eat. Nutritional recommendations vary from person to person. Ask your health care provider what is healthy for you. Here are some general guidelines. Calcium Recommendations Calcium is the most important (essential) mineral for bone health. Most people can get enough calcium from their diet, but supplements may be recommended for people who are at risk for osteoporosis. Good sources of calcium include:  Dairy products, such as low-fat or nonfat milk, cheese, and yogurt.  Dark green leafy vegetables, such as bok choy and broccoli.  Calcium-fortified foods, such as orange juice, cereal, bread, soy beverages, and tofu products.  Nuts, such as almonds.  Follow these recommended amounts for daily calcium intake:  Children, age 1?3: 700 mg.  Children, age 4?8: 1,000 mg.  Children, age 9?13: 1,300 mg.  Teens, age 14?18: 1,300 mg.  Adults, age 19?50: 1,000 mg.  Adults, age 51?70: ? Men: 1,000 mg. ? Women: 1,200 mg.  Adults, age 71 or older: 1,200 mg.  Pregnant and breastfeeding females: ? Teens: 1,300 mg. ? Adults: 1,000 mg.  Vitamin D Recommendations Vitamin D is the most essential vitamin for bone health. It helps the body to absorb calcium. Sunlight stimulates the skin to make vitamin D, so be sure to get enough sunlight. If you live in a cold climate or you do not get outside often, your health care provider may recommend that you take vitamin   D supplements. Good sources of vitamin D in your diet include:  Egg yolks.  Saltwater fish.  Milk and cereal fortified with vitamin D.  Follow these recommended amounts for daily vitamin D intake:  Children and teens, age 1?18: 600 international units.  Adults, age 50 or younger: 400-800 international units.  Adults, age 51 or older: 800-1,000 international units.  Other Nutrients Other nutrients  for bone health include:  Phosphorus. This mineral is found in meat, poultry, dairy foods, nuts, and legumes. The recommended daily intake for adult men and adult women is 700 mg.  Magnesium. This mineral is found in seeds, nuts, dark green vegetables, and legumes. The recommended daily intake for adult men is 400?420 mg. For adult women, it is 310?320 mg.  Vitamin K. This vitamin is found in green leafy vegetables. The recommended daily intake is 120 mg for adult men and 90 mg for adult women.  What type of physical activity is best for building and maintaining healthy bones? Weight-bearing and strength-building activities are important for building and maintaining peak bone mass. Weight-bearing activities cause muscles and bones to work against gravity. Strength-building activities increases muscle strength that supports bones. Weight-bearing and muscle-building activities include:  Walking and hiking.  Jogging and running.  Dancing.  Gym exercises.  Lifting weights.  Tennis and racquetball.  Climbing stairs.  Aerobics.  Adults should get at least 30 minutes of moderate physical activity on most days. Children should get at least 60 minutes of moderate physical activity on most days. Ask your health care provide what type of exercise is best for you. Where can I find more information? For more information, check out the following websites:  National Osteoporosis Foundation: http://nof.org/learn/basics  National Institutes of Health: http://www.niams.nih.gov/Health_Info/Bone/Bone_Health/bone_health_for_life.asp  This information is not intended to replace advice given to you by your health care provider. Make sure you discuss any questions you have with your health care provider. Document Released: 01/13/2004 Document Revised: 05/12/2016 Document Reviewed: 10/28/2014 Elsevier Interactive Patient Education  2018 [employer].  

## 2018-03-22 NOTE — Telephone Encounter (Signed)
Left message re July mammo at [facility] and November f/u with [facility]. Appointments mailed.

## 2018-03-22 NOTE — Progress Notes (Signed)
CLINIC:  Survivorship   REASON FOR VISIT:  Routine follow-up post-treatment for a recent history of breast cancer.  BRIEF ONCOLOGIC HISTORY:    Malignant neoplasm of upper-outer quadrant of left breast in female, estrogen receptor positive ([place])   07/10/2017 Surgery    Left lumpectomy: IDC grade 2, 1.3 cm, intermediate grade DCIS, margins negative, 1/2 lymph nodes positive, ER 5% positive weak staining, PR 0%, HER-2 negative ratio 1.73, Ki-67 15%, T1cN0 stage IA AJCC 8      07/10/2017 Miscellaneous    Mammaprint: Low risk, 10-year risk of recurrence untreated 10%      10/01/2017 - 11/14/2017 Radiation Therapy    Adj XRT      11/2017 -  Anti-estrogen oral therapy    Letrozole daily       INTERVAL HISTORY:  [name] presents to the [facility] today for our initial meeting to review her survivorship care plan detailing her treatment course for breast cancer, as well as monitoring long-term side effects of that treatment, education regarding health maintenance, screening, and overall wellness and health promotion.     Overall, [name] reports feeling quite well.  She is taking Letrozole daily and is tolerating it well.  She doesn't note any vaginal dryness, hot flashes, or an increase in arthralgias.    She sees her PCP Dr. Willey Blade regularly.      REVIEW OF SYSTEMS:  Review of Systems  Constitutional: Negative for appetite change, chills, fatigue, fever and unexpected weight change.  HENT:   Negative for hearing loss, lump/mass and trouble swallowing.   Eyes: Negative for eye problems and icterus.  Respiratory: Negative for chest tightness, cough and shortness of breath.   Cardiovascular: Negative for chest pain, leg swelling and palpitations.  Gastrointestinal: Negative for abdominal distention, abdominal pain, constipation, nausea and vomiting.  Endocrine: Negative for hot flashes.  Skin: Negative for itching and rash.  Neurological: Negative for dizziness,  extremity weakness, headaches and numbness.  Psychiatric/Behavioral: Negative for depression. The patient is not nervous/anxious.   Breast: Denies any new nodularity, masses, tenderness, nipple changes, or nipple discharge.      ONCOLOGY TREATMENT TEAM:  1. Surgeon:  Dr. Donne Hazel at [facility] 2. Medical Oncologist: Dr. Lindi Adie  3. Radiation Oncologist: Dr. Lisbeth Renshaw    PAST MEDICAL/SURGICAL HISTORY:  Past Medical History:  Diagnosis Date  . Anemia 04/02/2015  . Arthritis   . Atypical glandular cells on vaginal Papanicolaou smear 04/12/2016   Well get colpo and biopsy with JVF  . Bronchitis, chronic obstructive ([place]) since 2000's  . Cancer [facility])    dx with endometrial cancer   . Dysrhythmia   . GERD (gastroesophageal reflux disease)   . Gout   . Heart murmur   . Hiatal hernia   . Hiatal hernia 11/19/2012  . History of endometrial cancer 04/06/2016   Sp hysterectomy 2012  . Hypertension   . Ischemic colitis ([place])    03/2015  . Myeloproliferative disorder, JAK-2 positive 01/05/2014  . Obesity   . Obesity, morbid (more than 100 lbs over ideal weight or BMI > 40) ([facility]) 08/2011   Ht 5'7", wt 280 lb  . Pneumonia 09/2014  . Polycythemia vera(238.4) 01/18/2014  . PONV (postoperative nausea and vomiting)    post-op hypoxic resp failure 09/2011 and require Bi-Pap (possible r/t - pressure pulm edema vs asp PNA, COPD exacerbation, obesity hypoventilation  . Pulmonary hypertension ([place])   . Shortness of breath    unable to lay flat due to  dyspnea  . Vaginal Pap smear, abnormal    Past Surgical History:  Procedure Laterality Date  . ABDOMINAL HYSTERECTOMY  09/12/2011   Procedure: HYSTERECTOMY ABDOMINAL;  Surgeon: Janie Morning, MD;  Location: [facility];  Service: Gynecology;  Laterality: N/A;  Total Abdominal Hysterectomy, Bilateral Salpingo Oophorectomy  . BREAST LUMPECTOMY Left 07/10/2017   LEFT BREAST LUMPECTOMY WITH RADIOACTIVE SEED AND SENTINEL LYMPH NODE BIOPSY   . BREAST  LUMPECTOMY WITH RADIOACTIVE SEED AND SENTINEL LYMPH NODE BIOPSY Left 07/10/2017   Procedure: LEFT BREAST LUMPECTOMY WITH RADIOACTIVE SEED AND SENTINEL LYMPH NODE BIOPSY;  Surgeon: Rolm Bookbinder, MD;  Location: [place];  Service: General;  Laterality: Left;  . COLONOSCOPY  07/05/2012   Procedure: COLONOSCOPY;  Surgeon: Rogene Houston, MD;  Location[facility];  Service: Endoscopy;  Laterality: N/A;  730  . EYE SURGERY     bilateral 8 - 12 yrs ago  . FLEXIBLE SIGMOIDOSCOPY N/A 04/03/2015   Procedure: FLEXIBLE SIGMOIDOSCOPY;  Surgeon: Rogene Houston, MD;  [facility];  Service: Endoscopy;  Laterality: N/A;  . HYSTEROSCOPY W/D&C  08/15/2011   Procedure: DILATATION AND CURETTAGE (D&C) /HYSTEROSCOPY;  Surgeon: Jonnie Kind, MD;  Location: [facility];  Service: Gynecology;  Laterality: N/A;  With Suction Curette  . JOINT REPLACEMENT     left knee replaced 2014  . KNEE ARTHROPLASTY Left 03/15/2015   Procedure: COMPUTER ASSISTED TOTAL KNEE ARTHROPLASTY;  Surgeon: Marybelle Killings, MD;  Location: [place];  Service: Orthopedics;  Laterality: Left;  . SALPINGOOPHORECTOMY  09/12/2011   Procedure: SALPINGO OOPHERECTOMY;  Surgeon: Janie Morning, MD;  Location: [facility];  Service: Gynecology;  Laterality: Bilateral;     ALLERGIES:  Allergies  Allergen Reactions  . Carbamazepine Hives and Other (See Comments)    headache  . Sulfa Antibiotics     Rash     CURRENT MEDICATIONS:  Outpatient Encounter Medications as of 03/22/2018  Medication Sig Note  . albuterol (PROVENTIL HFA;VENTOLIN HFA) 108 (90 Base) MCG/ACT inhaler Inhale 2 puffs into the lungs every 6 (six) hours as needed for wheezing or shortness of breath. Reported on 02/07/2016   . aspirin EC 81 MG tablet Take 81 mg by mouth at bedtime.    Marland Kitchen atenolol (TENORMIN) 50 MG tablet Take 25 mg by mouth 2 (two) times daily.  07/10/2017: 900am  . cloNIDine (CATAPRES) 0.3 MG tablet Take 0.15 mg by mouth 2 (two) times daily.    . diclofenac (VOLTAREN) 75  MG EC tablet Take 1 tablet (75 mg total) by mouth 2 (two) times daily. (Patient not taking: Reported on 09/18/2017)   . diltiazem (CARDIZEM CD) 180 MG 24 hr capsule Take 180 mg by mouth daily.    Marland Kitchen esomeprazole (NEXIUM) 40 MG capsule Take 1 capsule (40 mg total) by mouth every morning.   . ferrous sulfate 325 (65 FE) MG EC tablet Take 325 mg by mouth daily.   . furosemide (LASIX) 20 MG tablet Take 2 tablets (40 mg total) by mouth daily.   . furosemide (LASIX) 20 MG tablet    . hyaluronate sodium (RADIAPLEXRX) GEL Apply 1 application topically 2 (two) times daily. Apply  To skin after rad tx and bedtime, nothing 4 hours prior to rad tx   . hydroxyurea (HYDREA) 500 MG capsule TAKE TWO CAPSULES BY MOUTH ONCE DAILY   . letrozole (FEMARA) 2.5 MG tablet Take 1 tablet (2.5 mg total) by mouth daily.   Marland Kitchen losartan (COZAAR) 100 MG tablet Take 100 mg by  mouth every evening.    . non-metallic deodorant Jethro Poling) MISC Apply 1 application topically. Apply after rad tx daily and as needed,, nothing prior to rad tx   . Simethicone (GAS RELIEF PO) Take 1 tablet by mouth daily.    . simvastatin (ZOCOR) 10 MG tablet Take 10 mg by mouth every evening.    . traMADol (ULTRAM) 50 MG tablet Take 1 tablet (50 mg total) by mouth every 6 (six) hours as needed.    No facility-administered encounter medications on file as of 03/22/2018.      ONCOLOGIC FAMILY HISTORY:  Family History  Problem Relation Age of Onset  . Other Mother   . Lung cancer Father   . Hypertension Son   . Anesthesia problems Neg Hx   . Hypotension Neg Hx   . Malignant hyperthermia Neg Hx   . Pseudochol deficiency Neg Hx      GENETIC COUNSELING/TESTING: Not at this time  SOCIAL HISTORY:  Social History   Socioeconomic History  . Marital status: Married    Spouse name: Not on file  . Number of children: Not on file  . Years of education: Not on file  . Highest education level: Not on file  Occupational History  . Not on file  Social  Needs  . Financial resource strain: Not on file  . Food insecurity:    Worry: Not on file    Inability: Not on file  . Transportation needs:    Medical: Not on file    Non-medical: Not on file  Tobacco Use  . Smoking status: Never Smoker  . Smokeless tobacco: Never Used  Substance and Sexual Activity  . Alcohol use: No  . Drug use: No  . Sexual activity: Never    Birth control/protection: Surgical    Comment: hysterectomy  Lifestyle  . Physical activity:    Days per week: Not on file    Minutes per session: Not on file  . Stress: Not on file  Relationships  . Social connections:    Talks on phone: Not on file    Gets together: Not on file    Attends religious service: Not on file    Active member of club or organization: Not on file    Attends meetings of clubs or organizations: Not on file    Relationship status: Not on file  . Intimate partner violence:    Fear of current or ex partner: Not on file    Emotionally abused: Not on file    Physically abused: Not on file    Forced sexual activity: Not on file  Other Topics Concern  . Not on file  Social History Narrative  . Not on file      PHYSICAL EXAMINATION:  Vital Signs:  There were no vitals filed for this visit. There were no vitals filed for this visit. General: Well-nourished, well-appearing female in no acute distress.  She is unaccompanied today.   HEENT: Head is normocephalic.  Pupils equal and reactive to light. Conjunctivae clear without exudate.  Sclerae anicteric. Oral mucosa is pink, moist.  Oropharynx is pink without lesions or erythema.  Lymph: No cervical, supraclavicular, or infraclavicular lymphadenopathy noted on palpation.  Cardiovascular: Regular rate and rhythm.Marland Kitchen Respiratory: Clear to auscultation bilaterally. Chest expansion symmetric; breathing non-labored.  GI: Abdomen soft and round; non-tender, non-distended. Bowel sounds normoactive.  GU: Deferred.  Neuro: No focal deficits. Steady  gait.  Psych: Mood and affect normal and appropriate for situation.  Extremities: No  edema. MSK: No focal spinal tenderness to palpation.  Full range of motion in bilateral upper extremities Skin: Warm and dry.  LABORATORY DATA:  None for this visit.  DIAGNOSTIC IMAGING:  None for this visit.      ASSESSMENT AND PLAN:  Ms.. Stafford is a pleasant 77 y.o. female with Stage IIA left breast invasive ductal carcinoma, ER+/PR-/HER2-, diagnosed in 07/2017, treated with lumpectomy, adjuvant radiation therapy, and anti-estrogen therapy with Letrozole beginning in 11/2017.  She presents to the Survivorship Clinic for our initial meeting and routine follow-up post-completion of treatment for breast cancer.    1. Stage IIA left breast cancer:  [name] is continuing to recover from definitive treatment for breast cancer. She will follow-up with her medical oncologist, Dr. Lindi Adie in 6 months with history and physical exam per surveillance protocol.  She will continue her anti-estrogen therapy with Letrozole. Thus far, she is tolerating the Letrozole well, with minimal side effects. She was instructed to make Dr. Lindi Adie or myself aware if she begins to experience any worsening side effects of the medication and I could see her back in clinic to help manage those side effects, as needed. Today, a comprehensive survivorship care plan and treatment summary was reviewed with the patient today detailing her breast cancer diagnosis, treatment course, potential late/long-term effects of treatment, appropriate follow-up care with recommendations for the future, and patient education resources.  A copy of this summary, along with a letter will be sent to the patient's primary care provider via mail/fax/In Basket message after today's visit.    2. Bone health:  Given [name]'s age/history of breast cancer and her current treatment regimen including anti-estrogen therapy with Letrozole, she is at risk for bone  demineralization.  Her last DEXA scan was 12/17/2017 and was normal.  I counseled her that we will need to repeat bone density testing every 2 years while she is taking an aromatase inhibitor.  In the meantime, she was encouraged to increase her consumption of foods rich in calcium, as well as increase her weight-bearing activities.  She was given education on specific activities to promote bone health.  3. Cancer screening:  Due to [name]'s history and her age, she should receive screening for skin cancers, colon cancer, and gynecologic cancers.  The information and recommendations are listed on the patient's comprehensive care plan/treatment summary and were reviewed in detail with the patient.    4. Health maintenance and wellness promotion: [name] was encouraged to consume 5-7 servings of fruits and vegetables per day. We reviewed the "Nutrition Rainbow" handout, as well as the handout "Take Control of Your Health and Reduce Your Cancer Risk" from the [employer].  She was also encouraged to engage in moderate to vigorous exercise for 30 minutes per day most days of the week. We discussed the [employer] fitness program, which is designed for cancer survivors to help them become more physically fit after cancer treatments.  She was instructed to limit her alcohol consumption and continue to abstain from tobacco use.     5. Support services/counseling: It is not uncommon for this period of the patient's cancer care trajectory to be one of many emotions and stressors.  We discussed an opportunity for her to participate in the next session of [facility] ("Finding Your New Normal") support group series designed for patients after they have completed treatment.   [name] was encouraged to take advantage of our many other support services programs, support groups, and/or  counseling in coping with her new life as a cancer survivor after completing anti-cancer treatment.  She was offered  support today through active listening and expressive supportive counseling.  She was given information regarding our available services and encouraged to contact me with any questions or for help enrolling in any of our support group/programs.    Dispo:   -Return to cancer center in 6 months for follow up  -Mammogram due in 05/2018 -Follow up with Dr. Donne Hazel in 1 year -She is welcome to return back to the Survivorship Clinic at any time; no additional follow-up needed at this time.  -Consider referral back to survivorship as a long-term survivor for continued surveillance  A total of (30) minutes of face-to-face time was spent with this patient with greater than 50% of that time in counseling and care-coordination.   Gardenia Phlegm, NP Survivorship Program [facility] [phone]   Note: PRIMARY CARE PROVIDER [name], [place] [phone]

## 2018-04-12 DIAGNOSIS — J449 Chronic obstructive pulmonary disease, unspecified: Secondary | ICD-10-CM | POA: Diagnosis not present

## 2018-04-19 DIAGNOSIS — Z79899 Other long term (current) drug therapy: Secondary | ICD-10-CM | POA: Diagnosis not present

## 2018-04-19 DIAGNOSIS — E539 Vitamin B deficiency, unspecified: Secondary | ICD-10-CM | POA: Diagnosis not present

## 2018-04-19 DIAGNOSIS — I10 Essential (primary) hypertension: Secondary | ICD-10-CM | POA: Diagnosis not present

## 2018-04-19 DIAGNOSIS — K219 Gastro-esophageal reflux disease without esophagitis: Secondary | ICD-10-CM | POA: Diagnosis not present

## 2018-04-26 DIAGNOSIS — I5032 Chronic diastolic (congestive) heart failure: Secondary | ICD-10-CM | POA: Diagnosis not present

## 2018-04-26 DIAGNOSIS — E785 Hyperlipidemia, unspecified: Secondary | ICD-10-CM | POA: Diagnosis not present

## 2018-04-26 DIAGNOSIS — I10 Essential (primary) hypertension: Secondary | ICD-10-CM | POA: Diagnosis not present

## 2018-05-12 DIAGNOSIS — J449 Chronic obstructive pulmonary disease, unspecified: Secondary | ICD-10-CM | POA: Diagnosis not present

## 2018-05-13 ENCOUNTER — Other Ambulatory Visit: Payer: Self-pay | Admitting: Adult Health

## 2018-05-13 DIAGNOSIS — Z9889 Other specified postprocedural states: Secondary | ICD-10-CM

## 2018-05-14 ENCOUNTER — Ambulatory Visit (HOSPITAL_COMMUNITY)
Admission: RE | Admit: 2018-05-14 | Discharge: 2018-05-14 | Disposition: A | Discharge status: Home / Self Care | Payer: Medicare Other | Source: Ambulatory Visit | Attending: Adult Health | Admitting: Adult Health

## 2018-05-14 DIAGNOSIS — C50412 Malignant neoplasm of upper-outer quadrant of left female breast: Secondary | ICD-10-CM

## 2018-05-14 DIAGNOSIS — Z853 Personal history of malignant neoplasm of breast: Secondary | ICD-10-CM | POA: Diagnosis not present

## 2018-05-14 DIAGNOSIS — R928 Other abnormal and inconclusive findings on diagnostic imaging of breast: Secondary | ICD-10-CM | POA: Diagnosis not present

## 2018-05-14 DIAGNOSIS — Z170 Estrogen receptor positive status [ER+]: Secondary | ICD-10-CM | POA: Insufficient documentation

## 2018-06-12 DIAGNOSIS — J449 Chronic obstructive pulmonary disease, unspecified: Secondary | ICD-10-CM | POA: Diagnosis not present

## 2018-07-04 DIAGNOSIS — L820 Inflamed seborrheic keratosis: Secondary | ICD-10-CM | POA: Diagnosis not present

## 2018-07-13 DIAGNOSIS — J449 Chronic obstructive pulmonary disease, unspecified: Secondary | ICD-10-CM | POA: Diagnosis not present

## 2018-07-23 DIAGNOSIS — Z961 Presence of intraocular lens: Secondary | ICD-10-CM | POA: Diagnosis not present

## 2018-07-23 DIAGNOSIS — H524 Presbyopia: Secondary | ICD-10-CM | POA: Diagnosis not present

## 2018-07-27 DIAGNOSIS — I5032 Chronic diastolic (congestive) heart failure: Secondary | ICD-10-CM | POA: Diagnosis not present

## 2018-07-27 DIAGNOSIS — Z79899 Other long term (current) drug therapy: Secondary | ICD-10-CM | POA: Diagnosis not present

## 2018-08-05 ENCOUNTER — Telehealth: Payer: Self-pay | Admitting: Hematology and Oncology

## 2018-08-05 DIAGNOSIS — D473 Essential (hemorrhagic) thrombocythemia: Secondary | ICD-10-CM | POA: Diagnosis not present

## 2018-08-05 DIAGNOSIS — I5032 Chronic diastolic (congestive) heart failure: Secondary | ICD-10-CM | POA: Diagnosis not present

## 2018-08-05 NOTE — Telephone Encounter (Signed)
I received a phone call from the patient's primary care physician stating that she has essential thrombocytosis and wants to consolidate her care for both breast cancer and ET with me.  Because of this I will order CBC when she comes back to see me in November.  I called and left a message for her.

## 2018-08-09 ENCOUNTER — Encounter (HOSPITAL_COMMUNITY): Payer: Self-pay | Admitting: Physical Therapy

## 2018-08-09 NOTE — Therapy (Signed)
[place], [place], [address] Phone: [phone]   Fax:  [phone]  Patient Details  Name: [name] MRN: [id number] Date of Birth: Nov 01, 1941 Referring Provider:  No ref. provider found  Encounter Date: 08/09/2018  PHYSICAL THERAPY DISCHARGE SUMMARY  Visits from Start of Care: 14  Current functional level related to goals / functional outcomes: DC    Remaining deficits: Unknown    Education / Equipment: None  Plan: Patient agrees to discharge.  Patient goals were partially met. Patient is being discharged due to not returning since the last visit.  ?????      Deniece Ree PT, DPT, CBIS  Supplemental Physical Therapist [facility]    Pager [phone] Acute Rehab Office [place] [address] [place], [place], [address] Phone: [phone]   Fax:  [phone]

## 2018-08-12 DIAGNOSIS — J449 Chronic obstructive pulmonary disease, unspecified: Secondary | ICD-10-CM | POA: Diagnosis not present

## 2018-09-12 DIAGNOSIS — J449 Chronic obstructive pulmonary disease, unspecified: Secondary | ICD-10-CM | POA: Diagnosis not present

## 2018-09-18 ENCOUNTER — Telehealth: Payer: Self-pay | Admitting: Hematology and Oncology

## 2018-09-18 NOTE — Telephone Encounter (Signed)
Spoke with patient and verified rescheduled appointment from Nov 15 to Nov 26.

## 2018-09-20 ENCOUNTER — Ambulatory Visit: Payer: Medicare Other | Admitting: Hematology and Oncology

## 2018-09-20 ENCOUNTER — Other Ambulatory Visit: Payer: Medicare Other

## 2018-09-26 NOTE — Progress Notes (Signed)
Patient Care Team: [name], MD as PCP - General (Internal Medicine) Jonnie Kind, MD as Consulting Physician (Obstetrics and Gynecology) Janie Morning, MD as Attending Physician (Obstetrics and Gynecology) Carole Civil, MD as Consulting Physician (Orthopedic Surgery) Nicholas Lose, MD as Consulting Physician (Hematology and Oncology) Kyung Rudd, MD as Consulting Physician (Radiation Oncology) Rolm Bookbinder, MD as Consulting Physician (General Surgery) Delice Bison, Charlestine Massed, NP as Nurse Practitioner (Hematology and Oncology)  DIAGNOSIS:    ICD-10-CM   1. Malignant neoplasm of upper-outer quadrant of left breast in female, estrogen receptor positive ([place]) C50.412    Z17.0   2. Myeloproliferative disorder, JAK-2 positive D47.1     SUMMARY OF ONCOLOGIC HISTORY:   Malignant neoplasm of upper-outer quadrant of left breast in female, estrogen receptor positive ([place])   07/10/2017 Surgery    Left lumpectomy: IDC grade 2, 1.3 cm, intermediate grade DCIS, margins negative, 1/2 lymph nodes positive, ER 5% positive weak staining, PR 0%, HER-2 negative ratio 1.73, Ki-67 15%, T1cN0 stage IA AJCC 8    07/10/2017 Miscellaneous    Mammaprint: Low risk, 10-year risk of recurrence untreated 10%    10/01/2017 - 11/14/2017 Radiation Therapy    Adj XRT    11/2017 -  Anti-estrogen oral therapy    Letrozole daily     CHIEF COMPLIANT: Follow-up on Letrozole therapy  INTERVAL HISTORY: [name] is a 77 y.o. with above-mentioned history of left breast cancer treated with lumpectomy and radiation. She is currently on letrozole therapy. Her most recent mammogram on 05/14/18 showed no evidence of malignancy in either breast. She presents to the clinic today by herself and notes she is doing well. She denies hot flashes or muscle stiffness. She notes cramps in her legs and that she is trying to drink enough water. She reports pain in her left breast and soreness under her arm last  month for a few days that has resolved in addition to redness around her left breast. Her most recent CBC shows WBC 9.0, Hg 16.0, platelets 593 and she expressed concern about her high platelet count effecting her eyesight. She has been taking ferrous sulfate per recommendation of her PCP.   REVIEW OF SYSTEMS:   Constitutional: Denies fevers, chills or abnormal weight loss Eyes: Denies blurriness of vision Ears, nose, mouth, throat, and face: Denies mucositis or sore throat Respiratory: Denies cough, dyspnea or wheezes Cardiovascular: Denies palpitation, chest discomfort Gastrointestinal:  Denies nausea, heartburn or change in bowel habits Skin: Denies abnormal skin rashes MSK: (+) leg cramps Lymphatics: Denies new lymphadenopathy or easy bruising Neurological:Denies numbness, tingling or new weaknesses Behavioral/Psych: Mood is stable, no new changes  Extremities: No lower extremity edema Breast: denies any  lumps or nodules in either breasts (+) soreness in left breast, resolved (+) redness in left breast All other systems were reviewed with the patient and are negative.  I have reviewed the past medical history, past surgical history, social history and family history with the patient and they are unchanged from previous note.  ALLERGIES:  is allergic to carbamazepine and sulfa antibiotics.  MEDICATIONS:  Current Outpatient Medications  Medication Sig Dispense Refill  . albuterol (PROVENTIL HFA;VENTOLIN HFA) 108 (90 Base) MCG/ACT inhaler Inhale 2 puffs into the lungs every 6 (six) hours as needed for wheezing or shortness of breath. Reported on 02/07/2016 1 Inhaler 0  . aspirin EC 81 MG tablet Take 81 mg by mouth at bedtime.     Marland Kitchen atenolol (TENORMIN) 50 MG tablet Take 25  mg by mouth 2 (two) times daily.     . cloNIDine (CATAPRES) 0.3 MG tablet Take 0.15 mg by mouth 2 (two) times daily.     Marland Kitchen diltiazem (CARDIZEM CD) 180 MG 24 hr capsule Take 180 mg by mouth daily.     Marland Kitchen esomeprazole  (NEXIUM) 40 MG capsule Take 1 capsule (40 mg total) by mouth every morning. 30 capsule 11  . ferrous sulfate 325 (65 FE) MG EC tablet Take 325 mg by mouth daily.    . furosemide (LASIX) 20 MG tablet Take 2 tablets (40 mg total) by mouth daily. 60 tablet 0  . furosemide (LASIX) 20 MG tablet   12  . hydroxyurea (HYDREA) 500 MG capsule Take 1 capsule (500 mg total) by mouth daily. May take with food to minimize GI side effects. 30 capsule 3  . letrozole (FEMARA) 2.5 MG tablet Take 1 tablet (2.5 mg total) by mouth daily. 90 tablet 3  . losartan (COZAAR) 100 MG tablet Take 100 mg by mouth every evening.     . Simethicone (GAS RELIEF PO) Take 1 tablet by mouth daily.     . simvastatin (ZOCOR) 10 MG tablet Take 10 mg by mouth every evening.     . traMADol (ULTRAM) 50 MG tablet Take 1 tablet (50 mg total) by mouth every 6 (six) hours as needed. 60 tablet 5   No current facility-administered medications for this visit.     PHYSICAL EXAMINATION: ECOG PERFORMANCE STATUS: 1 - Symptomatic but completely ambulatory  Vitals:   10/01/18 1408  BP: 132/72  Pulse: 63  Resp: 17  Temp: 98.5 F (36.9 C)  SpO2: 91%   Filed Weights   10/01/18 1408  Weight: 268 lb 1.6 oz (121.6 kg)    GENERAL:alert, no distress and comfortable SKIN: skin color, texture, turgor are normal, no rashes or significant lesions EYES: normal, Conjunctiva are pink and non-injected, sclera clear OROPHARYNX:no exudate, no erythema and lips, buccal mucosa, and tongue normal  NECK: supple, thyroid normal size, non-tender, without nodularity LYMPH:  no palpable lymphadenopathy in the cervical, axillary or inguinal LUNGS: clear to auscultation and percussion with normal breathing effort HEART: regular rate & rhythm and no murmurs and no lower extremity edema ABDOMEN:abdomen soft, non-tender and normal bowel sounds MUSCULOSKELETAL:no cyanosis of digits and no clubbing  NEURO: alert & oriented x 3 with fluent speech, no focal  motor/sensory deficits EXTREMITIES: No lower extremity edema BREAST: No palpable masses or nodules in either right or left breasts. No palpable axillary supraclavicular or infraclavicular adenopathy no breast tenderness or nipple discharge. (exam performed in the presence of a chaperone)  LABORATORY DATA:  I have reviewed the data as listed CMP Latest Ref Rng & Units 11/12/2017 11/11/2017 11/09/2017  Glucose 65 - 99 mg/dL 106(H) 107(H) 99  BUN 6 - 20 mg/dL 16 13 18   Creatinine 0.44 - 1.00 mg/dL 0.55 0.52 0.72  Sodium 135 - 145 mmol/L 139 138 139  Potassium 3.5 - 5.1 mmol/L 3.9 3.7 4.6  Chloride 101 - 111 mmol/L 94(L) 94(L) 104  CO2 22 - 32 mmol/L 37(H) 38(H) 32  Calcium 8.9 - 10.3 mg/dL 8.9 8.6(L) 8.8(L)  Total Protein 6.5 - 8.1 g/dL - - 7.2  Total Bilirubin 0.3 - 1.2 mg/dL - - 1.1  Alkaline Phos 38 - 126 U/L - - 84  AST 15 - 41 U/L - - 23  ALT 14 - 54 U/L - - 16    Lab Results  Component Value Date  WBC 9.0 10/01/2018   HGB 16.0 (H) 10/01/2018   HCT 50.2 (H) 10/01/2018   MCV 90.8 10/01/2018   PLT 593 (H) 10/01/2018   NEUTROABS 6.1 10/01/2018    ASSESSMENT & PLAN:  Malignant neoplasm of upper-outer quadrant of left breast in female, estrogen receptor positive ([place]) 07/10/2017 Left lumpectomy: IDC grade 2, 1.3 cm, intermediate grade DCIS, margins negative, 0/2 lymph nodes negative, ER 5% positive weak staining, PR 0%, HER-2 negative ratio 1.73, Ki-67 15%, T1cN0 stage IA AJCC 8  Mammaprint: Low risk luminal typeA, average 10-year risk of recurrence untreated 10% I counseled her extensively regarding the results of the Mammaprint and provided her with a copy of the test. Postoperative breast cellulitis: Treated and resolved with antibiotics Adj XRT 10/01/17- 11/15/17  Current treatment: Adjuvant antiestrogen therapy with Letrozole 1 mg daily times 5-7 years started 11/15/2017  Letrozole toxicities: Denies any hot flashes or myalgias denies any side effects  Breast cancer  surveillance: 1.  Breast exam 10/01/2018: Benign 2. Mammogram 05/14/2018: Benign breast density category B  Myeloproliferative disorder, JAK-2 positive Essential thrombocytosis: Lab review: Platelets 593 Recommendation: Start hydroxyurea 500 mg daily Return to clinic in 3 months with labs and follow-up I also instructed her to stop taking iron supplement   No orders of the defined types were placed in this encounter.  The patient has a good understanding of the overall plan. she agrees with it. she will call with any problems that may develop before the next visit here.  Nicholas Lose, MD 10/01/2018   I, Cloyde Reams Dorshimer, am acting as scribe for Nicholas Lose, MD.  I have reviewed the above documentation for accuracy and completeness, and I agree with the above.

## 2018-10-01 ENCOUNTER — Inpatient Hospital Stay (HOSPITAL_BASED_OUTPATIENT_CLINIC_OR_DEPARTMENT_OTHER): Payer: Medicare Other | Admitting: Hematology and Oncology

## 2018-10-01 ENCOUNTER — Inpatient Hospital Stay: Payer: Medicare Other | Attending: Hematology and Oncology

## 2018-10-01 ENCOUNTER — Telehealth: Payer: Self-pay | Admitting: Hematology and Oncology

## 2018-10-01 DIAGNOSIS — Z170 Estrogen receptor positive status [ER+]: Secondary | ICD-10-CM | POA: Insufficient documentation

## 2018-10-01 DIAGNOSIS — Z79899 Other long term (current) drug therapy: Secondary | ICD-10-CM | POA: Diagnosis not present

## 2018-10-01 DIAGNOSIS — Z79811 Long term (current) use of aromatase inhibitors: Secondary | ICD-10-CM

## 2018-10-01 DIAGNOSIS — C946 Myelodysplastic disease, not classified: Secondary | ICD-10-CM | POA: Insufficient documentation

## 2018-10-01 DIAGNOSIS — D473 Essential (hemorrhagic) thrombocythemia: Secondary | ICD-10-CM | POA: Diagnosis not present

## 2018-10-01 DIAGNOSIS — Z923 Personal history of irradiation: Secondary | ICD-10-CM

## 2018-10-01 DIAGNOSIS — Z7982 Long term (current) use of aspirin: Secondary | ICD-10-CM | POA: Insufficient documentation

## 2018-10-01 DIAGNOSIS — C50412 Malignant neoplasm of upper-outer quadrant of left female breast: Secondary | ICD-10-CM | POA: Insufficient documentation

## 2018-10-01 DIAGNOSIS — R252 Cramp and spasm: Secondary | ICD-10-CM | POA: Insufficient documentation

## 2018-10-01 DIAGNOSIS — D471 Chronic myeloproliferative disease: Secondary | ICD-10-CM

## 2018-10-01 LAB — CBC WITH DIFFERENTIAL (CANCER CENTER ONLY)
Abs Immature Granulocytes: 0.01 10*3/uL (ref 0.00–0.07)
BASOS PCT: 1 %
Basophils Absolute: 0.1 10*3/uL (ref 0.0–0.1)
EOS ABS: 0.4 10*3/uL (ref 0.0–0.5)
EOS PCT: 5 %
HEMATOCRIT: 50.2 % — AB (ref 36.0–46.0)
Hemoglobin: 16 g/dL — ABNORMAL HIGH (ref 12.0–15.0)
Immature Granulocytes: 0 %
Lymphocytes Relative: 19 %
Lymphs Abs: 1.7 10*3/uL (ref 0.7–4.0)
MCH: 28.9 pg (ref 26.0–34.0)
MCHC: 31.9 g/dL (ref 30.0–36.0)
MCV: 90.8 fL (ref 80.0–100.0)
MONO ABS: 0.6 10*3/uL (ref 0.1–1.0)
MONOS PCT: 7 %
NEUTROS PCT: 68 %
Neutro Abs: 6.1 10*3/uL (ref 1.7–7.7)
Platelet Count: 593 10*3/uL — ABNORMAL HIGH (ref 150–400)
RBC: 5.53 MIL/uL — ABNORMAL HIGH (ref 3.87–5.11)
RDW: 14.3 % (ref 11.5–15.5)
WBC Count: 9 10*3/uL (ref 4.0–10.5)
nRBC: 0 % (ref 0.0–0.2)

## 2018-10-01 MED ORDER — HYDROXYUREA 500 MG PO CAPS: 500.0000 mg | ORAL_CAPSULE | Freq: Every day | ORAL | 3 refills | Status: DC

## 2018-10-01 NOTE — Telephone Encounter (Signed)
Gave avs and calendar ° °

## 2018-10-01 NOTE — Assessment & Plan Note (Signed)
07/10/2017 Left lumpectomy: IDC grade 2, 1.3 cm, intermediate grade DCIS, margins negative, 0/2 lymph nodes negative, ER 5% positive weak staining, PR 0%, HER-2 negative ratio 1.73, Ki-67 15%, T1cN0 stage IA AJCC 8  Mammaprint: Low risk luminal typeA, average 10-year risk of recurrence untreated 10% I counseled her extensively regarding the results of the Mammaprint and provided her with a copy of the test. Postoperative breast cellulitis: Treated and resolved with antibiotics Adj XRT 10/01/17- 11/15/17  Current treatment: Adjuvant antiestrogen therapy with Letrozole 1 mg daily times 5-7 years started 11/15/2017  Letrozole toxicities:  Breast cancer surveillance: 1.  Breast exam 10/01/2018: Benign 2. Mammogram 05/14/2018: Benign breast density category B

## 2018-10-01 NOTE — Assessment & Plan Note (Signed)
Essential thrombocytosis: Lab review:

## 2018-10-12 DIAGNOSIS — I10 Essential (primary) hypertension: Secondary | ICD-10-CM | POA: Diagnosis not present

## 2018-10-12 DIAGNOSIS — E785 Hyperlipidemia, unspecified: Secondary | ICD-10-CM | POA: Diagnosis not present

## 2018-10-12 DIAGNOSIS — J449 Chronic obstructive pulmonary disease, unspecified: Secondary | ICD-10-CM | POA: Diagnosis not present

## 2018-10-12 DIAGNOSIS — G459 Transient cerebral ischemic attack, unspecified: Secondary | ICD-10-CM | POA: Diagnosis not present

## 2018-11-08 DIAGNOSIS — D473 Essential (hemorrhagic) thrombocythemia: Secondary | ICD-10-CM | POA: Diagnosis not present

## 2018-11-08 DIAGNOSIS — I5032 Chronic diastolic (congestive) heart failure: Secondary | ICD-10-CM | POA: Diagnosis not present

## 2018-11-09 DIAGNOSIS — D473 Essential (hemorrhagic) thrombocythemia: Secondary | ICD-10-CM | POA: Diagnosis not present

## 2018-11-14 DIAGNOSIS — Z961 Presence of intraocular lens: Secondary | ICD-10-CM | POA: Diagnosis not present

## 2018-11-14 DIAGNOSIS — H353220 Exudative age-related macular degeneration, left eye, stage unspecified: Secondary | ICD-10-CM | POA: Diagnosis not present

## 2018-11-14 DIAGNOSIS — H3530 Unspecified macular degeneration: Secondary | ICD-10-CM | POA: Diagnosis not present

## 2018-11-21 DIAGNOSIS — H43811 Vitreous degeneration, right eye: Secondary | ICD-10-CM | POA: Diagnosis not present

## 2018-11-21 DIAGNOSIS — H353132 Nonexudative age-related macular degeneration, bilateral, intermediate dry stage: Secondary | ICD-10-CM | POA: Diagnosis not present

## 2018-11-21 DIAGNOSIS — H35362 Drusen (degenerative) of macula, left eye: Secondary | ICD-10-CM | POA: Diagnosis not present

## 2018-11-21 DIAGNOSIS — H353221 Exudative age-related macular degeneration, left eye, with active choroidal neovascularization: Secondary | ICD-10-CM | POA: Diagnosis not present

## 2018-12-03 DIAGNOSIS — J449 Chronic obstructive pulmonary disease, unspecified: Secondary | ICD-10-CM | POA: Diagnosis not present

## 2018-12-03 DIAGNOSIS — J9611 Chronic respiratory failure with hypoxia: Secondary | ICD-10-CM | POA: Diagnosis not present

## 2018-12-03 DIAGNOSIS — I5032 Chronic diastolic (congestive) heart failure: Secondary | ICD-10-CM | POA: Diagnosis not present

## 2018-12-06 ENCOUNTER — Other Ambulatory Visit: Payer: Self-pay | Admitting: Hematology and Oncology

## 2018-12-15 DIAGNOSIS — S86911A Strain of unspecified muscle(s) and tendon(s) at lower leg level, right leg, initial encounter: Secondary | ICD-10-CM | POA: Diagnosis not present

## 2018-12-15 DIAGNOSIS — L03115 Cellulitis of right lower limb: Secondary | ICD-10-CM | POA: Diagnosis not present

## 2018-12-15 DIAGNOSIS — Z79899 Other long term (current) drug therapy: Secondary | ICD-10-CM | POA: Diagnosis not present

## 2018-12-15 DIAGNOSIS — M25551 Pain in right hip: Secondary | ICD-10-CM | POA: Diagnosis not present

## 2018-12-15 DIAGNOSIS — M129 Arthropathy, unspecified: Secondary | ICD-10-CM | POA: Diagnosis not present

## 2018-12-15 DIAGNOSIS — M25561 Pain in right knee: Secondary | ICD-10-CM | POA: Diagnosis not present

## 2018-12-21 DIAGNOSIS — M25561 Pain in right knee: Secondary | ICD-10-CM | POA: Diagnosis not present

## 2018-12-21 DIAGNOSIS — M1711 Unilateral primary osteoarthritis, right knee: Secondary | ICD-10-CM | POA: Diagnosis not present

## 2018-12-21 DIAGNOSIS — L03115 Cellulitis of right lower limb: Secondary | ICD-10-CM | POA: Diagnosis not present

## 2018-12-21 DIAGNOSIS — M25551 Pain in right hip: Secondary | ICD-10-CM | POA: Diagnosis not present

## 2018-12-24 DIAGNOSIS — H353221 Exudative age-related macular degeneration, left eye, with active choroidal neovascularization: Secondary | ICD-10-CM | POA: Diagnosis not present

## 2018-12-24 DIAGNOSIS — H353132 Nonexudative age-related macular degeneration, bilateral, intermediate dry stage: Secondary | ICD-10-CM | POA: Diagnosis not present

## 2018-12-24 DIAGNOSIS — H43811 Vitreous degeneration, right eye: Secondary | ICD-10-CM | POA: Diagnosis not present

## 2018-12-31 NOTE — Progress Notes (Signed)
Patient Care Team: [name], MD as PCP - General (Internal Medicine) Jonnie Kind, MD as Consulting Physician (Obstetrics and Gynecology) Janie Morning, MD as Attending Physician (Obstetrics and Gynecology) Carole Civil, MD as Consulting Physician (Orthopedic Surgery) Nicholas Lose, MD as Consulting Physician (Hematology and Oncology) Kyung Rudd, MD as Consulting Physician (Radiation Oncology) Rolm Bookbinder, MD as Consulting Physician (General Surgery) Delice Bison, Charlestine Massed, NP as Nurse Practitioner (Hematology and Oncology)  DIAGNOSIS:    ICD-10-CM   1. Malignant neoplasm of upper-outer quadrant of left breast in female, estrogen receptor positive ([place]) C50.412    Z17.0   2. Myeloproliferative disorder, JAK-2 positive D47.1 CBC with Differential (Cancer Center Only)    SUMMARY OF ONCOLOGIC HISTORY:   Malignant neoplasm of upper-outer quadrant of left breast in female, estrogen receptor positive ([place])   07/10/2017 Surgery    Left lumpectomy: IDC grade 2, 1.3 cm, intermediate grade DCIS, margins negative, 1/2 lymph nodes positive, ER 5% positive weak staining, PR 0%, HER-2 negative ratio 1.73, Ki-67 15%, T1cN0 stage IA AJCC 8    07/10/2017 Miscellaneous    Mammaprint: Low risk, 10-year risk of recurrence untreated 10%    10/01/2017 - 11/14/2017 Radiation Therapy    Adj XRT    11/2017 -  Anti-estrogen oral therapy    Letrozole daily     CHIEF COMPLIANT: Follow-up of letrozole therapy and hydroxurea  INTERVAL HISTORY: [name] is a 78 y.o. with above-mentioned history of left breast cancer treated with lumpectomy and radiation and is currently on letrozole therapy. She presents to the clinic alone today. She denies any nausea, diarrhea, or other side effects from hydrea or letrozole. Her labs from today show: WBC 8.1, Hg 16.0, platelets 451. She is aware of her heart murmur and will follow-up with her cardiologist. She reviewed her medication list with  me.   REVIEW OF SYSTEMS:   Constitutional: Denies fevers, chills or abnormal weight loss Eyes: Denies blurriness of vision Ears, nose, mouth, throat, and face: Denies mucositis or sore throat Respiratory: Denies cough, dyspnea or wheezes Cardiovascular: Denies palpitation, chest discomfort Gastrointestinal: Denies nausea, heartburn or change in bowel habits Skin: Denies abnormal skin rashes Lymphatics: Denies new lymphadenopathy or easy bruising Neurological: Denies numbness, tingling or new weaknesses Behavioral/Psych: Mood is stable, no new changes  Extremities: No lower extremity edema Breast: denies any pain or lumps or nodules in either breasts All other systems were reviewed with the patient and are negative.  I have reviewed the past medical history, past surgical history, social history and family history with the patient and they are unchanged from previous note.  ALLERGIES:  is allergic to carbamazepine and sulfa antibiotics.  MEDICATIONS:  Current Outpatient Medications  Medication Sig Dispense Refill  . albuterol (PROVENTIL HFA;VENTOLIN HFA) 108 (90 Base) MCG/ACT inhaler Inhale 2 puffs into the lungs every 6 (six) hours as needed for wheezing or shortness of breath. Reported on 02/07/2016 1 Inhaler 0  . aspirin EC 81 MG tablet Take 81 mg by mouth at bedtime.     Marland Kitchen atenolol (TENORMIN) 50 MG tablet Take 25 mg by mouth 2 (two) times daily.     . cloNIDine (CATAPRES) 0.3 MG tablet Take 0.15 mg by mouth 2 (two) times daily.     Marland Kitchen diltiazem (CARDIZEM CD) 180 MG 24 hr capsule Take 180 mg by mouth daily.     Marland Kitchen esomeprazole (NEXIUM) 40 MG capsule Take 1 capsule (40 mg total) by mouth every morning. 30 capsule 11  .  furosemide (LASIX) 20 MG tablet Take 2 tablets (40 mg total) by mouth daily. 60 tablet 0  . furosemide (LASIX) 20 MG tablet   12  . hydroxyurea (HYDREA) 500 MG capsule Take 1 capsule (500 mg total) by mouth daily. May take with food to minimize GI side effects. 90 capsule  3  . letrozole (FEMARA) 2.5 MG tablet TAKE 1 TABLET BY MOUTH ONCE DAILY 90 tablet 0  . losartan (COZAAR) 100 MG tablet Take 100 mg by mouth every evening.     . Simethicone (GAS RELIEF PO) Take 1 tablet by mouth daily.     . simvastatin (ZOCOR) 10 MG tablet Take 10 mg by mouth every evening.     . traMADol (ULTRAM) 50 MG tablet Take 1 tablet (50 mg total) by mouth every 6 (six) hours as needed. 60 tablet 5   No current facility-administered medications for this visit.     PHYSICAL EXAMINATION: ECOG PERFORMANCE STATUS: 1 - Symptomatic but completely ambulatory  Vitals:   01/01/19 1344  BP: 136/72  Pulse: 77  Resp: 17  Temp: 98.4 F (36.9 C)  SpO2: 91%   Filed Weights   01/01/19 1344  Weight: 263 lb 11.2 oz (119.6 kg)    GENERAL: alert, no distress and comfortable SKIN: skin color, texture, turgor are normal, no rashes or significant lesions EYES: normal, Conjunctiva are pink and non-injected, sclera clear OROPHARYNX: no exudate, no erythema and lips, buccal mucosa, and tongue normal  NECK: supple, thyroid normal size, non-tender, without nodularity LYMPH: no palpable lymphadenopathy in the cervical, axillary or inguinal LUNGS: clear to auscultation and percussion with normal breathing effort HEART: Pansystolic murmur in the aortic area. Regular rate & rhythm.   ABDOMEN: abdomen soft, non-tender and normal bowel sounds MUSCULOSKELETAL: no cyanosis of digits and no clubbing  NEURO: alert & oriented x 3 with fluent speech, no focal motor/sensory deficits EXTREMITIES: No lower extremity edema  LABORATORY DATA:  I have reviewed the data as listed CMP Latest Ref Rng & Units 11/12/2017 11/11/2017 11/09/2017  Glucose 65 - 99 mg/dL 106(H) 107(H) 99  BUN 6 - 20 mg/dL _0 Creatinine 0.44 - 1.00 mg/dL 0.55 0.52 0.72  Sodium 135 - 145 mmol/L 139 138 139  Potassium 3.5 - 5.1 mmol/L 3.9 3.7 4.6  Chloride 101 - 111 mmol/L 94(L) 94(L) 104  CO2 22 - 32 mmol/L 37(H) 38(H) 32  Calcium  8.9 - 10.3 mg/dL 8.9 8.6(L) 8.8(L)  Total Protein 6.5 - 8.1 g/dL - - 7.2  Total Bilirubin 0.3 - 1.2 mg/dL - - 1.1  Alkaline Phos 38 - 126 U/L - - 84  AST 15 - 41 U/L - - 23  ALT 14 - 54 U/L - - 16    Lab Results  Component Value Date   WBC 8.1 01/01/2019   HGB 16.0 (H) 01/01/2019   HCT 49.7 (H) 01/01/2019   MCV 96.3 01/01/2019   PLT 451 (H) 01/01/2019   NEUTROABS 5.7 01/01/2019    ASSESSMENT & PLAN:  Malignant neoplasm of upper-outer quadrant of left breast in female, estrogen receptor positive ([place]) 07/10/2017 Left lumpectomy: IDC grade 2, 1.3 cm, intermediate grade DCIS, margins negative, 0/2 lymph nodes negative, ER 5% positive weak staining, PR 0%, HER-2 negative ratio 1.73, Ki-67 15%, T1cN0 stage IA AJCC 8  Mammaprint: Low risk luminal typeA, average 10-year risk of recurrence untreated 10% I counseled her extensively regarding the results of the Mammaprint and provided her with a copy of the test.  Postoperative breast cellulitis: Treated and resolved with antibiotics Adj XRT 10/01/17- 11/15/17  Current treatment: Adjuvant antiestrogen therapy withLetrozole 1 mg daily times 5-7 years started 11/15/2017  Letrozole toxicities: Denies any hot flashes or myalgias denies any side effects  Breast cancer surveillance: 1.  Breast exam 10/01/2018: Benign 2. Mammogram 05/14/2018: Benign breast density category B ------------------------------------------------------------------------------------------------------------------------------------------------ Myeloproliferative disorder, JAK-2 positive Essential thrombocytosis: Lab review: Platelets 451 (was 593) Hb 16 Current treatment: hydroxyurea 500 mg daily started 10/01/2018 Hydroxyurea toxicities: Denies any side effects to hydroxyurea  Return to clinic in 4 months with labs and follow-up    Orders Placed This Encounter  Procedures  . CBC with Differential (Cancer Center Only)    Standing Status:   Future    Standing  Expiration Date:   01/02/2020   The patient has a good understanding of the overall plan. she agrees with it. she will call with any problems that may develop before the next visit here.  Nicholas Lose, MD 01/01/2019  Julious Oka Dorshimer am acting as scribe for Dr. Nicholas Lose.  I have reviewed the above documentation for accuracy and completeness, and I agree with the above.

## 2019-01-01 ENCOUNTER — Inpatient Hospital Stay: Payer: Medicare Other | Admitting: Hematology and Oncology

## 2019-01-01 ENCOUNTER — Other Ambulatory Visit: Payer: Self-pay

## 2019-01-01 ENCOUNTER — Telehealth: Payer: Self-pay | Admitting: Hematology and Oncology

## 2019-01-01 ENCOUNTER — Inpatient Hospital Stay: Payer: Medicare Other | Attending: Hematology and Oncology

## 2019-01-01 DIAGNOSIS — Z79899 Other long term (current) drug therapy: Secondary | ICD-10-CM

## 2019-01-01 DIAGNOSIS — Z7982 Long term (current) use of aspirin: Secondary | ICD-10-CM

## 2019-01-01 DIAGNOSIS — Z170 Estrogen receptor positive status [ER+]: Secondary | ICD-10-CM | POA: Diagnosis not present

## 2019-01-01 DIAGNOSIS — Z79811 Long term (current) use of aromatase inhibitors: Secondary | ICD-10-CM | POA: Diagnosis not present

## 2019-01-01 DIAGNOSIS — D471 Chronic myeloproliferative disease: Secondary | ICD-10-CM

## 2019-01-01 DIAGNOSIS — Z923 Personal history of irradiation: Secondary | ICD-10-CM | POA: Insufficient documentation

## 2019-01-01 DIAGNOSIS — C50412 Malignant neoplasm of upper-outer quadrant of left female breast: Secondary | ICD-10-CM | POA: Insufficient documentation

## 2019-01-01 DIAGNOSIS — C946 Myelodysplastic disease, not classified: Secondary | ICD-10-CM | POA: Insufficient documentation

## 2019-01-01 DIAGNOSIS — D473 Essential (hemorrhagic) thrombocythemia: Secondary | ICD-10-CM

## 2019-01-01 DIAGNOSIS — R011 Cardiac murmur, unspecified: Secondary | ICD-10-CM | POA: Insufficient documentation

## 2019-01-01 LAB — CMP (CANCER CENTER ONLY)
ALT: 14 U/L (ref 0–44)
AST: 19 U/L (ref 15–41)
Albumin: 3.9 g/dL (ref 3.5–5.0)
Alkaline Phosphatase: 99 U/L (ref 38–126)
Anion gap: 11 (ref 5–15)
BILIRUBIN TOTAL: 0.9 mg/dL (ref 0.3–1.2)
BUN: 17 mg/dL (ref 8–23)
CHLORIDE: 103 mmol/L (ref 98–111)
CO2: 29 mmol/L (ref 22–32)
CREATININE: 0.92 mg/dL (ref 0.44–1.00)
Calcium: 9.7 mg/dL (ref 8.9–10.3)
GFR, EST NON AFRICAN AMERICAN: 59 mL/min — AB (ref >60)
Glucose, Bld: 119 mg/dL — ABNORMAL HIGH (ref 70–99)
POTASSIUM: 3.6 mmol/L (ref 3.5–5.1)
Sodium: 143 mmol/L (ref 135–145)
TOTAL PROTEIN: 7.8 g/dL (ref 6.5–8.1)

## 2019-01-01 LAB — CBC WITH DIFFERENTIAL (CANCER CENTER ONLY)
Abs Immature Granulocytes: 0.02 10*3/uL (ref 0.00–0.07)
BASOS PCT: 1 %
Basophils Absolute: 0.1 10*3/uL (ref 0.0–0.1)
EOS ABS: 0.4 10*3/uL (ref 0.0–0.5)
Eosinophils Relative: 5 %
HEMATOCRIT: 49.7 % — AB (ref 36.0–46.0)
Hemoglobin: 16 g/dL — ABNORMAL HIGH (ref 12.0–15.0)
IMMATURE GRANULOCYTES: 0 %
LYMPHS ABS: 1.4 10*3/uL (ref 0.7–4.0)
Lymphocytes Relative: 17 %
MCH: 31 pg (ref 26.0–34.0)
MCHC: 32.2 g/dL (ref 30.0–36.0)
MCV: 96.3 fL (ref 80.0–100.0)
MONO ABS: 0.5 10*3/uL (ref 0.1–1.0)
MONOS PCT: 6 %
Neutro Abs: 5.7 10*3/uL (ref 1.7–7.7)
Neutrophils Relative %: 71 %
PLATELETS: 451 10*3/uL — AB (ref 150–400)
RBC: 5.16 MIL/uL — ABNORMAL HIGH (ref 3.87–5.11)
RDW: 17.2 % — AB (ref 11.5–15.5)
WBC Count: 8.1 10*3/uL (ref 4.0–10.5)
nRBC: 0 % (ref 0.0–0.2)

## 2019-01-01 MED ORDER — HYDROXYUREA 500 MG PO CAPS: 500.0000 mg | ORAL_CAPSULE | Freq: Every day | ORAL | 3 refills | Status: DC

## 2019-01-01 NOTE — Telephone Encounter (Signed)
Gave avs and calendar ° °

## 2019-01-01 NOTE — Assessment & Plan Note (Signed)
07/10/2017 Left lumpectomy: IDC grade 2, 1.3 cm, intermediate grade DCIS, margins negative, 0/2 lymph nodes negative, ER 5% positive weak staining, PR 0%, HER-2 negative ratio 1.73, Ki-67 15%, T1cN0 stage IA AJCC 8  Mammaprint: Low risk luminal typeA, average 10-year risk of recurrence untreated 10% I counseled her extensively regarding the results of the Mammaprint and provided her with a copy of the test. Postoperative breast cellulitis: Treated and resolved with antibiotics Adj XRT 10/01/17- 11/15/17  Current treatment: Adjuvant antiestrogen therapy withLetrozole 1 mg daily times 5-7 years started 11/15/2017  Letrozole toxicities: Denies any hot flashes or myalgias denies any side effects  Breast cancer surveillance: 1.  Breast exam 10/01/2018: Benign 2. Mammogram 05/14/2018: Benign breast density category B

## 2019-01-01 NOTE — Assessment & Plan Note (Signed)
Essential thrombocytosis: Lab review: Platelets 593 Current treatment: hydroxyurea 500 mg daily started 10/01/2018 Hydroxyurea toxicities:  Return to clinic in 6 months with labs and follow-up

## 2019-01-02 DIAGNOSIS — N390 Urinary tract infection, site not specified: Secondary | ICD-10-CM | POA: Diagnosis not present

## 2019-01-02 DIAGNOSIS — L0390 Cellulitis, unspecified: Secondary | ICD-10-CM | POA: Diagnosis not present

## 2019-01-02 DIAGNOSIS — N3001 Acute cystitis with hematuria: Secondary | ICD-10-CM | POA: Diagnosis not present

## 2019-01-03 DIAGNOSIS — J449 Chronic obstructive pulmonary disease, unspecified: Secondary | ICD-10-CM | POA: Diagnosis not present

## 2019-01-10 ENCOUNTER — Ambulatory Visit: Payer: Medicare Other | Admitting: Obstetrics and Gynecology

## 2019-01-10 VITALS — BP 136/79 | HR 58 | Ht 64.0 in | Wt 261.2 lb

## 2019-01-10 DIAGNOSIS — N950 Postmenopausal bleeding: Secondary | ICD-10-CM

## 2019-01-10 NOTE — Progress Notes (Signed)
Patient ID: NURI BRANCA, female   DOB: [birth year], 78 y.o.   MRN: [id number]    [place] Visit  @DATE @            Patient name: PHYILLIS DASCOLI MRN [id number]  Date of birth: 02-07-1941  CC & HPI:  JAYNELL CASTAGNOLA is a 78 y.o. female presenting today for vaginal bleeding w/ hx of hysterectomy. Had light minimal spotting last week, then the following morning there was more of deep red color when she went to restroom. She called Dr. Willey Blade office and sent urine for cultures thinking was a UTI results came back normal. She notes some burning with urination.  ROS:  ROS +atropic vaginitis +single episode of vaginal bleeding -fever  All systems are negative except as noted in the HPI and PMH.    Pertinent History Reviewed:   Reviewed: uterine cancer Medical         Past Medical History:  Diagnosis Date  . Anemia 04/02/2015  . Arthritis   . Atypical glandular cells on vaginal Papanicolaou smear 04/12/2016   Well get colpo and biopsy with JVF  . Bronchitis, chronic obstructive ([place]) since 2000's  . Cancer [facility])    dx with endometrial cancer   . Dysrhythmia   . GERD (gastroesophageal reflux disease)   . Gout   . Heart murmur   . Hiatal hernia   . Hiatal hernia 11/19/2012  . History of endometrial cancer 04/06/2016   Sp hysterectomy 2012  . Hypertension   . Ischemic colitis ([place])    03/2015  . Myeloproliferative disorder, JAK-2 positive 01/05/2014  . Obesity   . Obesity, morbid (more than 100 lbs over ideal weight or BMI > 40) (HCC) 08/2011   Ht 5'7", wt 280 lb  . Pneumonia 09/2014  . Polycythemia vera(238.4) 01/18/2014  . PONV (postoperative nausea and vomiting)    post-op hypoxic resp failure 09/2011 and require Bi-Pap (possible r/t - pressure pulm edema vs asp PNA, COPD exacerbation, obesity hypoventilation  . Pulmonary hypertension ([place])   . Shortness of breath    unable to lay flat due to dyspnea  . Vaginal Pap smear, abnormal                                Surgical Hx:    Past Surgical History:  Procedure Laterality Date  . ABDOMINAL HYSTERECTOMY  09/12/2011   Procedure: HYSTERECTOMY ABDOMINAL;  Surgeon: Janie Morning, MD;  [facility];  Service: Gynecology;  Laterality: N/A;  Total Abdominal Hysterectomy, Bilateral Salpingo Oophorectomy  . BREAST LUMPECTOMY Left 07/10/2017   LEFT BREAST LUMPECTOMY WITH RADIOACTIVE SEED AND SENTINEL LYMPH NODE BIOPSY   . BREAST LUMPECTOMY WITH RADIOACTIVE SEED AND SENTINEL LYMPH NODE BIOPSY Left 07/10/2017   Procedure: LEFT BREAST LUMPECTOMY WITH RADIOACTIVE SEED AND SENTINEL LYMPH NODE BIOPSY;  Surgeon: Rolm Bookbinder, MD;  Location: [place];  Service: General;  Laterality: Left;  . COLONOSCOPY  07/05/2012   Procedure: COLONOSCOPY;  Surgeon: Rogene Houston, MD;  [facility];  Service: Endoscopy;  Laterality: N/A;  730  . EYE SURGERY     bilateral 8 - 12 yrs ago  . FLEXIBLE SIGMOIDOSCOPY N/A 04/03/2015   Procedure: FLEXIBLE SIGMOIDOSCOPY;  Surgeon: Rogene Houston, MD;  [facility];  Service: Endoscopy;  Laterality: N/A;  . HYSTEROSCOPY W/D&C  08/15/2011   Procedure: DILATATION AND CURETTAGE (D&C) /HYSTEROSCOPY;  Surgeon: Jonnie Kind,  MD;  Location[facility];  Service: Gynecology;  Laterality: N/A;  With Suction Curette  . JOINT REPLACEMENT     left knee replaced 2014  . KNEE ARTHROPLASTY Left 03/15/2015   Procedure: COMPUTER ASSISTED TOTAL KNEE ARTHROPLASTY;  Surgeon: Marybelle Killings, MD;  Location: [place];  Service: Orthopedics;  Laterality: Left;  . SALPINGOOPHORECTOMY  09/12/2011   Procedure: SALPINGO OOPHERECTOMY;  Surgeon: Janie Morning, MD;  [facility];  Service: Gynecology;  Laterality: Bilateral;   Medications: Reviewed & Updated - see associated section                       Current Outpatient Medications:  .  albuterol (PROVENTIL HFA;VENTOLIN HFA) 108 (90 Base) MCG/ACT inhaler, Inhale 2 puffs into the lungs every 6 (six) hours as needed for wheezing or shortness of  breath. Reported on 02/07/2016, Disp: 1 Inhaler, Rfl: 0 .  aspirin EC 81 MG tablet, Take 81 mg by mouth at bedtime. , Disp: , Rfl:  .  atenolol (TENORMIN) 50 MG tablet, Take 25 mg by mouth 2 (two) times daily. , Disp: , Rfl:  .  cloNIDine (CATAPRES) 0.3 MG tablet, Take 0.15 mg by mouth 2 (two) times daily. , Disp: , Rfl:  .  diltiazem (CARDIZEM CD) 180 MG 24 hr capsule, Take 180 mg by mouth daily. , Disp: , Rfl:  .  esomeprazole (NEXIUM) 40 MG capsule, Take 1 capsule (40 mg total) by mouth every morning., Disp: 30 capsule, Rfl: 11 .  furosemide (LASIX) 20 MG tablet, , Disp: , Rfl: 12 .  hydroxyurea (HYDREA) 500 MG capsule, Take 1 capsule (500 mg total) by mouth daily. May take with food to minimize GI side effects., Disp: 90 capsule, Rfl: 3 .  letrozole (FEMARA) 2.5 MG tablet, TAKE 1 TABLET BY MOUTH ONCE DAILY, Disp: 90 tablet, Rfl: 0 .  losartan (COZAAR) 100 MG tablet, Take 100 mg by mouth every evening. , Disp: , Rfl:  .  Simethicone (GAS RELIEF PO), Take 1 tablet by mouth daily. , Disp: , Rfl:  .  simvastatin (ZOCOR) 10 MG tablet, Take 10 mg by mouth every evening. , Disp: , Rfl:  .  traMADol (ULTRAM) 50 MG tablet, Take 1 tablet (50 mg total) by mouth every 6 (six) hours as needed., Disp: 60 tablet, Rfl: 5 .  furosemide (LASIX) 20 MG tablet, Take 2 tablets (40 mg total) by mouth daily., Disp: 60 tablet, Rfl: 0   Social History: Reviewed -  reports that she has never smoked. She has never used smokeless tobacco.  Objective Findings:  Vitals: Blood pressure 136/79, pulse (!) 58, height 5\' 4"  (1.626 m), weight 261 lb 3.2 oz (118.5 kg).  PHYSICAL EXAMINATION General appearance - alert, well appearing, and in no distress Mental status - normal mood, behavior, speech, dress, motor activity, and thought processes, affect appropriate to mood  PELVIC Vagina - vaginal cuff well healed, atrophic tissues, white crack of skin, in the creases along the inside line, healing Cervix - surgically  absent Uterus - surgically absent  Assessment & Plan:   A:  1.  Single episode of vaginal bleeding 2. Atrophic vaginitis, stable  P:  1.  GYN PRN   By signing my name below, I, Samul Dada, attest that this documentation has been prepared under the direction and in the presence of Jonnie Kind, MD. Electronically Signed: [place]. 01/10/19. 10:55 AM.  I personally performed the services described in this documentation,  which was SCRIBED in my presence. The recorded information has been reviewed and considered accurate. It has been edited as necessary during review. Jonnie Kind, MD

## 2019-01-28 DIAGNOSIS — H353132 Nonexudative age-related macular degeneration, bilateral, intermediate dry stage: Secondary | ICD-10-CM | POA: Diagnosis not present

## 2019-01-28 DIAGNOSIS — H353221 Exudative age-related macular degeneration, left eye, with active choroidal neovascularization: Secondary | ICD-10-CM | POA: Diagnosis not present

## 2019-02-01 DIAGNOSIS — J449 Chronic obstructive pulmonary disease, unspecified: Secondary | ICD-10-CM | POA: Diagnosis not present

## 2019-02-01 DIAGNOSIS — Z96652 Presence of left artificial knee joint: Secondary | ICD-10-CM | POA: Diagnosis not present

## 2019-02-01 DIAGNOSIS — J9601 Acute respiratory failure with hypoxia: Secondary | ICD-10-CM | POA: Diagnosis not present

## 2019-02-03 DIAGNOSIS — I10 Essential (primary) hypertension: Secondary | ICD-10-CM | POA: Diagnosis not present

## 2019-02-03 DIAGNOSIS — I5032 Chronic diastolic (congestive) heart failure: Secondary | ICD-10-CM | POA: Diagnosis not present

## 2019-02-23 ENCOUNTER — Other Ambulatory Visit: Payer: Self-pay | Admitting: Hematology and Oncology

## 2019-03-03 DIAGNOSIS — H43812 Vitreous degeneration, left eye: Secondary | ICD-10-CM | POA: Diagnosis not present

## 2019-03-03 DIAGNOSIS — H353221 Exudative age-related macular degeneration, left eye, with active choroidal neovascularization: Secondary | ICD-10-CM | POA: Diagnosis not present

## 2019-03-04 DIAGNOSIS — Z96652 Presence of left artificial knee joint: Secondary | ICD-10-CM | POA: Diagnosis not present

## 2019-03-04 DIAGNOSIS — J9601 Acute respiratory failure with hypoxia: Secondary | ICD-10-CM | POA: Diagnosis not present

## 2019-03-04 DIAGNOSIS — J449 Chronic obstructive pulmonary disease, unspecified: Secondary | ICD-10-CM | POA: Diagnosis not present

## 2019-03-06 ENCOUNTER — Encounter (INDEPENDENT_AMBULATORY_CARE_PROVIDER_SITE_OTHER): Payer: Self-pay | Admitting: Orthopaedic Surgery

## 2019-03-06 ENCOUNTER — Other Ambulatory Visit: Payer: Self-pay

## 2019-03-06 ENCOUNTER — Ambulatory Visit (INDEPENDENT_AMBULATORY_CARE_PROVIDER_SITE_OTHER): Payer: Self-pay

## 2019-03-06 ENCOUNTER — Ambulatory Visit (INDEPENDENT_AMBULATORY_CARE_PROVIDER_SITE_OTHER): Payer: Medicare Other | Admitting: Orthopaedic Surgery

## 2019-03-06 VITALS — Ht 64.0 in | Wt 265.0 lb

## 2019-03-06 DIAGNOSIS — G8929 Other chronic pain: Secondary | ICD-10-CM | POA: Diagnosis not present

## 2019-03-06 DIAGNOSIS — Z96652 Presence of left artificial knee joint: Secondary | ICD-10-CM

## 2019-03-06 DIAGNOSIS — Z6841 Body Mass Index (BMI) 40.0 and over, adult: Secondary | ICD-10-CM | POA: Diagnosis not present

## 2019-03-06 DIAGNOSIS — M1711 Unilateral primary osteoarthritis, right knee: Secondary | ICD-10-CM | POA: Diagnosis not present

## 2019-03-06 DIAGNOSIS — M25561 Pain in right knee: Secondary | ICD-10-CM

## 2019-03-06 DIAGNOSIS — M25551 Pain in right hip: Secondary | ICD-10-CM | POA: Diagnosis not present

## 2019-03-06 MED ORDER — LIDOCAINE HCL 1 % IJ SOLN
0.5000 mL | INTRAMUSCULAR | Status: AC | PRN
  Administered 2019-03-06: 12:00:00 .5 mL

## 2019-03-06 MED ORDER — METHYLPREDNISOLONE ACETATE 40 MG/ML IJ SUSP
40.0000 mg | INTRAMUSCULAR | Status: AC | PRN
  Administered 2019-03-06: 40 mg via INTRA_ARTICULAR

## 2019-03-06 MED ORDER — BUPIVACAINE HCL 0.25 % IJ SOLN
4.0000 mL | INTRAMUSCULAR | Status: AC | PRN
  Administered 2019-03-06: 4 mL via INTRA_ARTICULAR

## 2019-03-06 NOTE — Progress Notes (Signed)
Office Visit Note   Patient: [name]           Date of Birth: July 08, 1941           MRN: [id number] Visit Date: 03/06/2019              Requested by: [name], MD [address] [place], [place] [address] PCP: [name], MD   Assessment & Plan: Visit Diagnoses:  1. Pain in right hip   2. Chronic pain of right knee   3. Unilateral primary osteoarthritis, right knee   4. Body mass index 40.0-44.9, adult (HCC)   5. Status post total left knee replacement     Plan: Right knee injection performed which she tolerated well with improvement in her pain.  She still using a rolling walker.  X-rays reviewed showed no evidence of metastatic disease with her history of breast cancer 2018 treated with surgery followed by radiation.  We reviewed the x-rays of her knee which shows end-stage degenerative arthritis.  She had her left total knee done by me several years ago and did well.  Hopefully should get good relief with the injection.  Her husband recently had above-knee amputation by Dr. Sherren Mocha early is getting prosthetic fitting and currently she is having to help him.  She still using a rolling walker with reverse seat and I will follow her up as needed.  If the injection is not helpful she will let us know.  She will use Aleve 2 p.o. twice daily since she previously got good relief with the previous injection in her knee 1 year ago as well as short use of anti-inflammatories.  Patient had no significant arthritis in her right hip.  The patient meets the AMA guidelines for Morbid (severe) obesity with a BMI > 40.0 and I have recommended weight loss.taget weight less than 233.    Follow-Up Instructions: Return in about 6 months (around 09/05/2019).   Orders:  Orders Placed This Encounter  Procedures  . Large Joint Inj: R knee  . XR Knee 1-2 Views Right  . XR HIP UNILAT W OR W/O PELVIS 2-3 VIEWS RIGHT   No orders of the defined types were placed in this encounter.     Procedures: Large Joint Inj: R knee on 03/06/2019 11:30 AM Indications: pain and joint swelling Details: 22 G 1.5 in needle, anterolateral approach  Arthrogram: No  Medications: 40 mg methylPREDNISolone acetate 40 MG/ML; 0.5 mL lidocaine 1 %; 4 mL bupivacaine 0.25 % Outcome: tolerated well, no immediate complications Procedure, treatment alternatives, risks and benefits explained, specific risks discussed. Consent was given by the patient. Immediately prior to procedure a time out was called to verify the correct patient, procedure, equipment, support staff and site/side marked as required. Patient was prepped and draped in the usual sterile fashion.       Clinical Data: No additional findings.   Subjective: Chief Complaint  Patient presents with  . Right Knee - Pain  . Right Hip - Pain    HPI 78 year old female returns she had previous left on the arthroplasty by me several years ago in 2016 continue to do well.  Since that time she had left breast cancer with lumpectomy radioactive seeds sentinel node biopsy and radiation treatment.  She has had some right groin pain but primarily pain in the right knee difficulty ambulating and is using a rolling walker with reverse seat.  She lives with her husband and he just had above-knee amputation and  is awaiting prosthetic fitting.  She is used Advil, Tylenol without relief Biofreeze topical lidocaine.  Right knee swells she states if she did use her walker feels like it would give way and she is concerned about falling. Review of Systems 14 point review of systems positive for breast cancer, previous left total knee arthroplasty.  Confusion with pain medication post left total knee.  Positive for GERD hiatal hernia history of thrombocytosis.  Myeloproliferative disorder.  Arthritis of the base of the thumb, end-stage right knee osteoarthritis.  Grade 1 endometrial cancer.  Otherwise negative is obtains HPI.   Objective: Vital Signs: Ht 5\' 4"   (1.626 m)   Wt 265 lb (120.2 kg)   BMI 45.49 kg/m   Physical Exam Constitutional:      Appearance: She is well-developed.  HENT:     Head: Normocephalic.     Right Ear: External ear normal.     Left Ear: External ear normal.  Eyes:     Pupils: Pupils are equal, round, and reactive to light.  Neck:     Thyroid: No thyromegaly.     Trachea: No tracheal deviation.  Cardiovascular:     Rate and Rhythm: Normal rate.  Pulmonary:     Effort: Pulmonary effort is normal.  Abdominal:     Palpations: Abdomen is soft.  Skin:    General: Skin is warm and dry.  Neurological:     Mental Status: She is alert and oriented to person, place, and time.  Psychiatric:        Behavior: Behavior normal.     Ortho Exam no inguinal lymphadenopathy on the right normal hip range of motion.  Negative straight leg raising 90 degrees minimal trochanteric bursal tenderness on the right negative on the left.  Well-healed left total knee arthroplasty midline incision.  Good range of motion left knee no crepitus no pain no effusion.  Right knee shows 2+ knee effusion crepitus palpable osteophytes she lacks 5 degrees reaching full extension flexes past 100 degrees.  Significant crepitus with knee range of motion.  Distal pulses are palpable.  Anterior tib gastrocsoleus is strong.  Specialty Comments:  No specialty comments available.  Imaging: Xr Hip Unilat W Or W/o Pelvis 2-3 Views Right  Result Date: 03/06/2019 AP pelvis and frog-leg lateral x-ray obtained of the right hip.  This shows no pathologic lesions with patient's history of breast cancer.  Femoral neck hip joint are normal. Impression: Normal pelvis and hip x-rays.  Xr Knee 1-2 Views Right  Result Date: 03/06/2019 Three-view x-rays right knee obtained and reviewed.  This includes AP lateral sunrise patella.  This shows tricompartment arthritis with lateral marginal osteophytes mild valgus and severe patellofemoral arthritis bone-on-bone changes.  Impression: Moderate to severe tricompartment arthritis with genu valgum.    PMFS History: Patient Active Problem List   Diagnosis Date Noted  . Body mass index 40.0-44.9, adult ([place]) 03/06/2019  . CAP (community acquired pneumonia) 11/09/2017  . Macrocytosis 07/11/2017  . Respiratory complication, postoperative   . Breast cancer ([place]) left 07/04/2017  . Malignant neoplasm of upper-outer quadrant of left breast in female, estrogen receptor positive ([place]) 06/22/2017  . Endometrial cancer, grade I ([place]) 04/20/2016  . Encounter for follow-up surveillance of endometrial cancer 04/06/2016  . Hematochezia 04/02/2015  . Rectal bleeding 04/02/2015  . Anemia 04/02/2015  . Bronchitis, chronic obstructive ([place])   . Arthritis   . Hypertension   . Dysrhythmia   . Obesity   . Essential hypertension   .  Status post total left knee replacement 03/15/2015  . Myeloproliferative disorder, JAK-2 positive 01/05/2014  . Thrombocytosis ([place]) 12/19/2013  . CMC arthritis, thumb, degenerative 06/03/2013  . Bone, giant cell tumor 06/03/2013  . GERD (gastroesophageal reflux disease) 11/19/2012  . Hiatal hernia 11/19/2012  . Acute respiratory failure with hypoxia ([place]) 09/13/2011  . Bilateral pulmonary infiltrates on chest x-ray 09/13/2011  . Leukocytosis 09/13/2011  . Hyperglycemia 09/13/2011  . Chronic lung disease 08/16/2011    Class: Chronic  . Obesity, morbid (more than 100 lbs over ideal weight or BMI > 40) ([place]) 08/07/2011  . KNEE, ARTHRITIS, DEGEN./OSTEO 05/24/2010   Past Medical History:  Diagnosis Date  . Anemia 04/02/2015  . Arthritis   . Atypical glandular cells on vaginal Papanicolaou smear 04/12/2016   Well get colpo and biopsy with JVF  . Bronchitis, chronic obstructive ([place]) since 2000's  . Cancer [facility])    dx with endometrial cancer   . Dysrhythmia   . GERD (gastroesophageal reflux disease)   . Gout   . Heart murmur   . Hiatal hernia   . Hiatal hernia 11/19/2012  . History of  endometrial cancer 04/06/2016   Sp hysterectomy 2012  . Hypertension   . Ischemic colitis ([place])    03/2015  . Myeloproliferative disorder, JAK-2 positive 01/05/2014  . Obesity   . Obesity, morbid (more than 100 lbs over ideal weight or BMI > 40) (HCC) 08/2011   Ht 5'7", wt 280 lb  . Pneumonia 09/2014  . Polycythemia vera(238.4) 01/18/2014  . PONV (postoperative nausea and vomiting)    post-op hypoxic resp failure 09/2011 and require Bi-Pap (possible r/t - pressure pulm edema vs asp PNA, COPD exacerbation, obesity hypoventilation  . Pulmonary hypertension ([place])   . Shortness of breath    unable to lay flat due to dyspnea  . Vaginal Pap smear, abnormal     Family History  Problem Relation Age of Onset  . Other Mother   . Lung cancer Father   . Hypertension Son   . Anesthesia problems Neg Hx   . Hypotension Neg Hx   . Malignant hyperthermia Neg Hx   . Pseudochol deficiency Neg Hx     Past Surgical History:  Procedure Laterality Date  . ABDOMINAL HYSTERECTOMY  09/12/2011   Procedure: HYSTERECTOMY ABDOMINAL;  Surgeon: Janie Morning, MD;  [facility];  Service: Gynecology;  Laterality: N/A;  Total Abdominal Hysterectomy, Bilateral Salpingo Oophorectomy  . BREAST LUMPECTOMY Left 07/10/2017   LEFT BREAST LUMPECTOMY WITH RADIOACTIVE SEED AND SENTINEL LYMPH NODE BIOPSY   . BREAST LUMPECTOMY WITH RADIOACTIVE SEED AND SENTINEL LYMPH NODE BIOPSY Left 07/10/2017   Procedure: LEFT BREAST LUMPECTOMY WITH RADIOACTIVE SEED AND SENTINEL LYMPH NODE BIOPSY;  Surgeon: Rolm Bookbinder, MD;  Location: [place];  Service: General;  Laterality: Left;  . COLONOSCOPY  07/05/2012   Procedure: COLONOSCOPY;  Surgeon: Rogene Houston, MD;  [facility];  Service: Endoscopy;  Laterality: N/A;  730  . EYE SURGERY     bilateral 8 - 12 yrs ago  . FLEXIBLE SIGMOIDOSCOPY N/A 04/03/2015   Procedure: FLEXIBLE SIGMOIDOSCOPY;  Surgeon: Rogene Houston, MD;  [facility];  Service: Endoscopy;   Laterality: N/A;  . HYSTEROSCOPY W/D&C  08/15/2011   Procedure: DILATATION AND CURETTAGE (D&C) /HYSTEROSCOPY;  Surgeon: Jonnie Kind, MD;  [facility];  Service: Gynecology;  Laterality: N/A;  With Suction Curette  . JOINT REPLACEMENT     left knee replaced 2014  . KNEE ARTHROPLASTY  Left 03/15/2015   Procedure: COMPUTER ASSISTED TOTAL KNEE ARTHROPLASTY;  Surgeon: Marybelle Killings, MD;  Location: [place];  Service: Orthopedics;  Laterality: Left;  . SALPINGOOPHORECTOMY  09/12/2011   Procedure: SALPINGO OOPHERECTOMY;  Surgeon: Janie Morning, MD;  [facility];  Service: Gynecology;  Laterality: Bilateral;   Social History   Occupational History  . Not on file  Tobacco Use  . Smoking status: Never Smoker  . Smokeless tobacco: Never Used  Substance and Sexual Activity  . Alcohol use: No  . Drug use: No  . Sexual activity: Never    Birth control/protection: Surgical    Comment: hysterectomy

## 2019-04-03 DIAGNOSIS — J9601 Acute respiratory failure with hypoxia: Secondary | ICD-10-CM | POA: Diagnosis not present

## 2019-04-03 DIAGNOSIS — J449 Chronic obstructive pulmonary disease, unspecified: Secondary | ICD-10-CM | POA: Diagnosis not present

## 2019-04-03 DIAGNOSIS — Z96652 Presence of left artificial knee joint: Secondary | ICD-10-CM | POA: Diagnosis not present

## 2019-04-14 DIAGNOSIS — H353132 Nonexudative age-related macular degeneration, bilateral, intermediate dry stage: Secondary | ICD-10-CM | POA: Diagnosis not present

## 2019-04-14 DIAGNOSIS — H353221 Exudative age-related macular degeneration, left eye, with active choroidal neovascularization: Secondary | ICD-10-CM | POA: Diagnosis not present

## 2019-04-28 NOTE — Assessment & Plan Note (Signed)
07/10/2017 Left lumpectomy: IDC grade 2, 1.3 cm, intermediate grade DCIS, margins negative, 0/2 lymph nodes negative, ER 5% positive weak staining, PR 0%, HER-2 negative ratio 1.73, Ki-67 15%, T1cN0 stage IA AJCC 8  Mammaprint: Low risk luminal typeA, average 10-year risk of recurrence untreated 10% I counseled her extensively regarding the results of the Mammaprint and provided her with a copy of the test. Postoperative breast cellulitis: Treated and resolved with antibiotics Adj XRT 10/01/17- 11/15/17  Current treatment: Adjuvant antiestrogen therapy withLetrozole 1 mg daily times 5-7 yearsstarted 11/15/2017  Letrozole toxicities:Denies any hot flashes or myalgias denies any side effects  Breast cancer surveillance: 1.Breast exam 10/01/2018: Benign 2.Mammogram 05/14/2018: Benign breast density category B

## 2019-04-28 NOTE — Assessment & Plan Note (Signed)
Myeloproliferative disorder, JAK-2 positive Essential thrombocytosis: Lab review: Platelets 451 (was 593) Hb 16 Current treatment: hydroxyurea 500mg  daily started 10/01/2018 Hydroxyurea toxicities: Denies any side effects to hydroxyurea  Return to clinic in 6 months with labs and follow-up

## 2019-05-01 ENCOUNTER — Telehealth: Payer: Self-pay

## 2019-05-01 NOTE — Progress Notes (Signed)
Patient Care Team: [name], MD as PCP - General (Internal Medicine) Jonnie Kind, MD as Consulting Physician (Obstetrics and Gynecology) Janie Morning, MD as Attending Physician (Obstetrics and Gynecology) Carole Civil, MD as Consulting Physician (Orthopedic Surgery) Nicholas Lose, MD as Consulting Physician (Hematology and Oncology) Kyung Rudd, MD as Consulting Physician (Radiation Oncology) Rolm Bookbinder, MD as Consulting Physician (General Surgery) Delice Bison, Charlestine Massed, NP as Nurse Practitioner (Hematology and Oncology)  DIAGNOSIS:    ICD-10-CM   1. Malignant neoplasm of upper-outer quadrant of left breast in female, estrogen receptor positive ([place])  C50.412    Z17.0   2. Essential thrombocytosis (HCC)  D47.3     SUMMARY OF ONCOLOGIC HISTORY: Oncology History  Malignant neoplasm of upper-outer quadrant of left breast in female, estrogen receptor positive ([place])  07/10/2017 Surgery   Left lumpectomy: IDC grade 2, 1.3 cm, intermediate grade DCIS, margins negative, 1/2 lymph nodes positive, ER 5% positive weak staining, PR 0%, HER-2 negative ratio 1.73, Ki-67 15%, T1cN0 stage IA AJCC 8   07/10/2017 Miscellaneous   Mammaprint: Low risk, 10-year risk of recurrence untreated 10%   10/01/2017 - 11/14/2017 Radiation Therapy   Adj XRT   11/2017 -  Anti-estrogen oral therapy   Letrozole daily     CHIEF COMPLIANT: Follow-up of letrozole therapy and hydroxurea  INTERVAL HISTORY: [name] is a 78 y.o. with above-mentioned history of left breast cancer treated with lumpectomyand radiation who is currently on letrozole therapy. She also has a history of JAK2 mutation and is currently on hydrea 580m daily. She presents to the clinic alone today.  REVIEW OF SYSTEMS:   Constitutional: Denies fevers, chills or abnormal weight loss Eyes: Denies blurriness of vision Ears, nose, mouth, throat, and face: Denies mucositis or sore throat Respiratory: Denies cough,  dyspnea or wheezes Cardiovascular: Denies palpitation, chest discomfort Gastrointestinal: Denies nausea, heartburn or change in bowel habits Skin: Denies abnormal skin rashes Lymphatics: Denies new lymphadenopathy or easy bruising Neurological: Denies numbness, tingling or new weaknesses Behavioral/Psych: Mood is stable, no new changes  Extremities: No lower extremity edema Breast: denies any pain or lumps or nodules in either breasts All other systems were reviewed with the patient and are negative.  I have reviewed the past medical history, past surgical history, social history and family history with the patient and they are unchanged from previous note.  ALLERGIES:  is allergic to carbamazepine and sulfa antibiotics.  MEDICATIONS:  Current Outpatient Medications  Medication Sig Dispense Refill  . albuterol (PROVENTIL HFA;VENTOLIN HFA) 108 (90 Base) MCG/ACT inhaler Inhale 2 puffs into the lungs every 6 (six) hours as needed for wheezing or shortness of breath. Reported on 02/07/2016 (Patient not taking: Reported on 03/06/2019) 1 Inhaler 0  . aspirin EC 81 MG tablet Take 81 mg by mouth at bedtime.     .Marland Kitchenatenolol (TENORMIN) 50 MG tablet Take 25 mg by mouth 2 (two) times daily.     . cloNIDine (CATAPRES) 0.3 MG tablet Take 0.15 mg by mouth 2 (two) times daily.     .Marland Kitchendiltiazem (CARDIZEM CD) 180 MG 24 hr capsule Take 180 mg by mouth daily.     .Marland Kitchenesomeprazole (NEXIUM) 40 MG capsule Take 1 capsule (40 mg total) by mouth every morning. 30 capsule 11  . furosemide (LASIX) 20 MG tablet Take 2 tablets (40 mg total) by mouth daily. 60 tablet 0  . furosemide (LASIX) 20 MG tablet   12  . hydroxyurea (HYDREA) 500 MG capsule Take  1 capsule (500 mg total) by mouth daily. May take with food to minimize GI side effects. 90 capsule 3  . letrozole (FEMARA) 2.5 MG tablet Take 1 tablet by mouth once daily 90 tablet 3  . losartan (COZAAR) 100 MG tablet Take 100 mg by mouth every evening.     . Simethicone (GAS  RELIEF PO) Take 1 tablet by mouth daily.     . simvastatin (ZOCOR) 10 MG tablet Take 10 mg by mouth every evening.     . traMADol (ULTRAM) 50 MG tablet Take 1 tablet (50 mg total) by mouth every 6 (six) hours as needed. (Patient not taking: Reported on 03/06/2019) 60 tablet 5   No current facility-administered medications for this visit.     PHYSICAL EXAMINATION: ECOG PERFORMANCE STATUS: 1 - Symptomatic but completely ambulatory  There were no vitals filed for this visit. There were no vitals filed for this visit.  GENERAL: alert, no distress and comfortable SKIN: skin color, texture, turgor are normal, no rashes or significant lesions EYES: normal, Conjunctiva are pink and non-injected, sclera clear OROPHARYNX: no exudate, no erythema and lips, buccal mucosa, and tongue normal  NECK: supple, thyroid normal size, non-tender, without nodularity LYMPH: no palpable lymphadenopathy in the cervical, axillary or inguinal LUNGS: clear to auscultation and percussion with normal breathing effort HEART: regular rate & rhythm and no murmurs and no lower extremity edema ABDOMEN: abdomen soft, non-tender and normal bowel sounds MUSCULOSKELETAL: no cyanosis of digits and no clubbing  NEURO: alert & oriented x 3 with fluent speech, no focal motor/sensory deficits EXTREMITIES: No lower extremity edema  LABORATORY DATA:  I have reviewed the data as listed CMP Latest Ref Rng & Units 01/01/2019 11/12/2017 11/11/2017  Glucose 70 - 99 mg/dL 119(H) 106(H) 107(H)  BUN 8 - 23 mg/dL 17 16 13   Creatinine 0.44 - 1.00 mg/dL 0.92 0.55 0.52  Sodium 135 - 145 mmol/L 143 139 138  Potassium 3.5 - 5.1 mmol/L 3.6 3.9 3.7  Chloride 98 - 111 mmol/L 103 94(L) 94(L)  CO2 22 - 32 mmol/L 29 37(H) 38(H)  Calcium 8.9 - 10.3 mg/dL 9.7 8.9 8.6(L)  Total Protein 6.5 - 8.1 g/dL 7.8 - -  Total Bilirubin 0.3 - 1.2 mg/dL 0.9 - -  Alkaline Phos 38 - 126 U/L 99 - -  AST 15 - 41 U/L 19 - -  ALT 0 - 44 U/L 14 - -    Lab Results   Component Value Date   WBC 7.0 05/02/2019   HGB 14.9 05/02/2019   HCT 46.4 (H) 05/02/2019   MCV 101.8 (H) 05/02/2019   PLT 429 (H) 05/02/2019   NEUTROABS 4.4 05/02/2019    ASSESSMENT & PLAN:  Malignant neoplasm of upper-outer quadrant of left breast in female, estrogen receptor positive ([place]) 07/10/2017 Left lumpectomy: IDC grade 2, 1.3 cm, intermediate grade DCIS, margins negative, 0/2 lymph nodes negative, ER 5% positive weak staining, PR 0%, HER-2 negative ratio 1.73, Ki-67 15%, T1cN0 stage IA AJCC 8  Mammaprint: Low risk luminal typeA, average 10-year risk of recurrence untreated 10% I counseled her extensively regarding the results of the Mammaprint and provided her with a copy of the test. Postoperative breast cellulitis: Treated and resolved with antibiotics Adj XRT 10/01/17- 11/15/17  Current treatment: Adjuvant antiestrogen therapy withLetrozole 1 mg daily times 5-7 yearsstarted 11/15/2017  Letrozole toxicities:Denies any hot flashes or myalgias denies any side effects  Breast cancer surveillance: 1.Breast exam 10/01/2018: Benign 2.Mammogram 05/14/2018: Benign breast density category B  Essential thrombocytosis (HCC) Myeloproliferative disorder, JAK-2 positive Essential thrombocytosis: Lab review: 05/02/2019: Platelet count 429, hemoglobin 14.9 bili 65 old records useful and the [facility] ED with abdominal cycle of adjuvant therapy for possible stent of negative and ongoing Natalie breast patient will get a different " Current treatment: hydroxyurea 524m daily started 10/01/2018 Hydroxyurea toxicities: Denies any side effects to hydroxyurea  Return to clinic in 6 months with labs and follow-up    No orders of the defined types were placed in this encounter.  The patient has a good understanding of the overall plan. she agrees with it. she will call with any problems that may develop before the next visit here.  GNicholas Lose MD 05/02/2019  IJulious Oka Dorshimer am acting as scribe for Dr. VNicholas Lose  I have reviewed the above documentation for accuracy and completeness, and I agree with the above.

## 2019-05-01 NOTE — Telephone Encounter (Signed)
Called and left voicemail regarding pre-screening questions for appt on 6/26  

## 2019-05-02 ENCOUNTER — Other Ambulatory Visit: Payer: Medicare Other

## 2019-05-02 ENCOUNTER — Other Ambulatory Visit: Payer: Self-pay

## 2019-05-02 ENCOUNTER — Inpatient Hospital Stay (HOSPITAL_BASED_OUTPATIENT_CLINIC_OR_DEPARTMENT_OTHER): Payer: Medicare Other | Admitting: Hematology and Oncology

## 2019-05-02 ENCOUNTER — Inpatient Hospital Stay: Payer: Medicare Other | Attending: Hematology and Oncology

## 2019-05-02 DIAGNOSIS — Z170 Estrogen receptor positive status [ER+]: Secondary | ICD-10-CM

## 2019-05-02 DIAGNOSIS — D473 Essential (hemorrhagic) thrombocythemia: Secondary | ICD-10-CM | POA: Diagnosis not present

## 2019-05-02 DIAGNOSIS — D471 Chronic myeloproliferative disease: Secondary | ICD-10-CM

## 2019-05-02 DIAGNOSIS — Z79899 Other long term (current) drug therapy: Secondary | ICD-10-CM | POA: Insufficient documentation

## 2019-05-02 DIAGNOSIS — Z7982 Long term (current) use of aspirin: Secondary | ICD-10-CM

## 2019-05-02 DIAGNOSIS — C50412 Malignant neoplasm of upper-outer quadrant of left female breast: Secondary | ICD-10-CM

## 2019-05-02 LAB — CBC WITH DIFFERENTIAL (CANCER CENTER ONLY)
Abs Immature Granulocytes: 0.02 10*3/uL (ref 0.00–0.07)
Basophils Absolute: 0.1 10*3/uL (ref 0.0–0.1)
Basophils Relative: 2 %
Eosinophils Absolute: 0.4 10*3/uL (ref 0.0–0.5)
Eosinophils Relative: 5 %
HCT: 46.4 % — ABNORMAL HIGH (ref 36.0–46.0)
Hemoglobin: 14.9 g/dL (ref 12.0–15.0)
Immature Granulocytes: 0 %
Lymphocytes Relative: 20 %
Lymphs Abs: 1.4 10*3/uL (ref 0.7–4.0)
MCH: 32.7 pg (ref 26.0–34.0)
MCHC: 32.1 g/dL (ref 30.0–36.0)
MCV: 101.8 fL — ABNORMAL HIGH (ref 80.0–100.0)
Monocytes Absolute: 0.7 10*3/uL (ref 0.1–1.0)
Monocytes Relative: 10 %
Neutro Abs: 4.4 10*3/uL (ref 1.7–7.7)
Neutrophils Relative %: 63 %
Platelet Count: 429 10*3/uL — ABNORMAL HIGH (ref 150–400)
RBC: 4.56 MIL/uL (ref 3.87–5.11)
RDW: 14.3 % (ref 11.5–15.5)
WBC Count: 7 10*3/uL (ref 4.0–10.5)
nRBC: 0 % (ref 0.0–0.2)

## 2019-05-04 DIAGNOSIS — Z96652 Presence of left artificial knee joint: Secondary | ICD-10-CM | POA: Diagnosis not present

## 2019-05-04 DIAGNOSIS — J9601 Acute respiratory failure with hypoxia: Secondary | ICD-10-CM | POA: Diagnosis not present

## 2019-05-04 DIAGNOSIS — J449 Chronic obstructive pulmonary disease, unspecified: Secondary | ICD-10-CM | POA: Diagnosis not present

## 2019-05-15 ENCOUNTER — Other Ambulatory Visit: Payer: Self-pay | Admitting: Hematology and Oncology

## 2019-05-15 DIAGNOSIS — C50412 Malignant neoplasm of upper-outer quadrant of left female breast: Secondary | ICD-10-CM

## 2019-05-15 DIAGNOSIS — Z170 Estrogen receptor positive status [ER+]: Secondary | ICD-10-CM

## 2019-05-20 ENCOUNTER — Other Ambulatory Visit: Payer: Self-pay

## 2019-05-20 ENCOUNTER — Ambulatory Visit (HOSPITAL_COMMUNITY): Payer: Medicare Other

## 2019-05-20 ENCOUNTER — Ambulatory Visit (HOSPITAL_COMMUNITY): Admission: RE | Admit: 2019-05-20 | Payer: Medicare Other | Source: Ambulatory Visit

## 2019-05-20 ENCOUNTER — Ambulatory Visit (HOSPITAL_COMMUNITY)
Admission: RE | Admit: 2019-05-20 | Discharge: 2019-05-20 | Disposition: A | Discharge status: Home / Self Care | Payer: Medicare Other | Source: Ambulatory Visit | Attending: Hematology and Oncology | Admitting: Hematology and Oncology

## 2019-05-20 DIAGNOSIS — Z170 Estrogen receptor positive status [ER+]: Secondary | ICD-10-CM | POA: Diagnosis not present

## 2019-05-20 DIAGNOSIS — C50412 Malignant neoplasm of upper-outer quadrant of left female breast: Secondary | ICD-10-CM | POA: Diagnosis not present

## 2019-05-20 DIAGNOSIS — R928 Other abnormal and inconclusive findings on diagnostic imaging of breast: Secondary | ICD-10-CM | POA: Diagnosis not present

## 2019-05-26 DIAGNOSIS — H353132 Nonexudative age-related macular degeneration, bilateral, intermediate dry stage: Secondary | ICD-10-CM | POA: Diagnosis not present

## 2019-05-26 DIAGNOSIS — H43812 Vitreous degeneration, left eye: Secondary | ICD-10-CM | POA: Diagnosis not present

## 2019-05-26 DIAGNOSIS — H353221 Exudative age-related macular degeneration, left eye, with active choroidal neovascularization: Secondary | ICD-10-CM | POA: Diagnosis not present

## 2019-06-03 DIAGNOSIS — J449 Chronic obstructive pulmonary disease, unspecified: Secondary | ICD-10-CM | POA: Diagnosis not present

## 2019-06-03 DIAGNOSIS — J9601 Acute respiratory failure with hypoxia: Secondary | ICD-10-CM | POA: Diagnosis not present

## 2019-06-03 DIAGNOSIS — Z96652 Presence of left artificial knee joint: Secondary | ICD-10-CM | POA: Diagnosis not present

## 2019-06-27 DIAGNOSIS — D473 Essential (hemorrhagic) thrombocythemia: Secondary | ICD-10-CM | POA: Diagnosis not present

## 2019-06-27 DIAGNOSIS — I5032 Chronic diastolic (congestive) heart failure: Secondary | ICD-10-CM | POA: Diagnosis not present

## 2019-06-27 DIAGNOSIS — I10 Essential (primary) hypertension: Secondary | ICD-10-CM | POA: Diagnosis not present

## 2019-07-04 DIAGNOSIS — Z96652 Presence of left artificial knee joint: Secondary | ICD-10-CM | POA: Diagnosis not present

## 2019-07-04 DIAGNOSIS — J449 Chronic obstructive pulmonary disease, unspecified: Secondary | ICD-10-CM | POA: Diagnosis not present

## 2019-07-04 DIAGNOSIS — J9601 Acute respiratory failure with hypoxia: Secondary | ICD-10-CM | POA: Diagnosis not present

## 2019-07-15 DIAGNOSIS — H353221 Exudative age-related macular degeneration, left eye, with active choroidal neovascularization: Secondary | ICD-10-CM | POA: Diagnosis not present

## 2019-07-15 DIAGNOSIS — H43812 Vitreous degeneration, left eye: Secondary | ICD-10-CM | POA: Diagnosis not present

## 2019-07-15 DIAGNOSIS — H353132 Nonexudative age-related macular degeneration, bilateral, intermediate dry stage: Secondary | ICD-10-CM | POA: Diagnosis not present

## 2019-07-15 DIAGNOSIS — H43811 Vitreous degeneration, right eye: Secondary | ICD-10-CM | POA: Diagnosis not present

## 2019-08-04 DIAGNOSIS — J9601 Acute respiratory failure with hypoxia: Secondary | ICD-10-CM | POA: Diagnosis not present

## 2019-08-04 DIAGNOSIS — J449 Chronic obstructive pulmonary disease, unspecified: Secondary | ICD-10-CM | POA: Diagnosis not present

## 2019-08-04 DIAGNOSIS — Z96652 Presence of left artificial knee joint: Secondary | ICD-10-CM | POA: Diagnosis not present

## 2019-08-14 DIAGNOSIS — H353221 Exudative age-related macular degeneration, left eye, with active choroidal neovascularization: Secondary | ICD-10-CM | POA: Diagnosis not present

## 2019-08-14 DIAGNOSIS — H43811 Vitreous degeneration, right eye: Secondary | ICD-10-CM | POA: Diagnosis not present

## 2019-08-14 DIAGNOSIS — H35362 Drusen (degenerative) of macula, left eye: Secondary | ICD-10-CM | POA: Diagnosis not present

## 2019-08-14 DIAGNOSIS — H353132 Nonexudative age-related macular degeneration, bilateral, intermediate dry stage: Secondary | ICD-10-CM | POA: Diagnosis not present

## 2019-08-27 ENCOUNTER — Ambulatory Visit (INDEPENDENT_AMBULATORY_CARE_PROVIDER_SITE_OTHER): Payer: Medicare Other | Admitting: Orthopaedic Surgery

## 2019-08-27 ENCOUNTER — Encounter: Payer: Self-pay | Admitting: Orthopaedic Surgery

## 2019-08-27 ENCOUNTER — Other Ambulatory Visit: Payer: Self-pay

## 2019-08-27 DIAGNOSIS — M1711 Unilateral primary osteoarthritis, right knee: Secondary | ICD-10-CM

## 2019-08-27 DIAGNOSIS — Z6841 Body Mass Index (BMI) 40.0 and over, adult: Secondary | ICD-10-CM

## 2019-08-27 MED ORDER — LIDOCAINE HCL 1 % IJ SOLN
2.0000 mL | INTRAMUSCULAR | Status: AC | PRN
  Administered 2019-08-27: 2 mL

## 2019-08-27 MED ORDER — BUPIVACAINE HCL 0.5 % IJ SOLN
2.0000 mL | INTRAMUSCULAR | Status: AC | PRN
  Administered 2019-08-27: 2 mL via INTRA_ARTICULAR

## 2019-08-27 MED ORDER — METHYLPREDNISOLONE ACETATE 40 MG/ML IJ SUSP
80.0000 mg | INTRAMUSCULAR | Status: AC | PRN
  Administered 2019-08-27: 80 mg via INTRA_ARTICULAR

## 2019-08-27 NOTE — Progress Notes (Signed)
Office Visit Note   Patient: [name]           Date of Birth: 1941-05-10           MRN: [id number] Visit Date: 08/27/2019              Requested by: [name], MD [address] [place],  [place] [address] PCP: [name], MD   Assessment & Plan: Visit Diagnoses:  1. Unilateral primary osteoarthritis, right knee   2. Body mass index 40.0-44.9, adult (HCC)     Plan: Repeat cortisone injection lateral aspect right knee with history of osteoarthritis and valgus deformity.  Has been taking Advil which she can continue.  Consider Visco supplementation  Follow-Up Instructions: Return if symptoms worsen or fail to improve.   Orders:  No orders of the defined types were placed in this encounter.  No orders of the defined types were placed in this encounter.     Procedures: Large Joint Inj: R knee on 08/27/2019 2:16 PM Indications: pain and diagnostic evaluation Details: 25 G 1.5 in needle, anterolateral approach  Arthrogram: No  Medications: 2 mL lidocaine 1 %; 2 mL bupivacaine 0.5 %; 80 mg methylPREDNISolone acetate 40 MG/ML Procedure, treatment alternatives, risks and benefits explained, specific risks discussed. Consent was given by the patient. Immediately prior to procedure a time out was called to verify the correct patient, procedure, equipment, support staff and site/side marked as required. Patient was prepped and draped in the usual sterile fashion.       Clinical Data: No additional findings.   Subjective: No chief complaint on file. [name] was seen by Dr. Lorin Mercy in April for evaluation of right knee pain.  She had evidence of tricompartmental degenerative changes with genu valgum.  Cortisone injection was quite helpful.  She has been taking several Advil several times a day and notes that recently it has not "helped".  She does use a rolling walker.  She has some other issues as well.  She would like to have another cortisone injection.  HPI   Review of Systems   Objective: Vital Signs: There were no vitals taken for this visit.  Physical Exam Constitutional:      Appearance: She is well-developed.  Eyes:     Pupils: Pupils are equal, round, and reactive to light.  Pulmonary:     Effort: Pulmonary effort is normal.  Skin:    General: Skin is warm and dry.  Neurological:     Mental Status: She is alert and oriented to person, place, and time.  Psychiatric:        Behavior: Behavior normal.     Ortho Exam large legs.  Difficult to ascertain if she had an effusion in her right knee.  More lateral than medial joint pain.  Some patellar crepitation.  Flexed about 90 degrees when thigh would touch calf.  No instability.  Motor exam appears to be intact    Specialty Comments:  No specialty comments available.  Imaging: No results found.   PMFS History: Patient Active Problem List   Diagnosis Date Noted  . Body mass index 40.0-44.9, adult ([place]) 03/06/2019  . Unilateral primary osteoarthritis, right knee 03/06/2019  . CAP (community acquired pneumonia) 11/09/2017  . Macrocytosis 07/11/2017  . Respiratory complication, postoperative   . Breast cancer ([place]) left 07/04/2017  . Malignant neoplasm of upper-outer quadrant of left breast in female, estrogen receptor positive ([place]) 06/22/2017  . Endometrial cancer, grade I ([place]) 04/20/2016  .  Encounter for follow-up surveillance of endometrial cancer 04/06/2016  . Hematochezia 04/02/2015  . Rectal bleeding 04/02/2015  . Anemia 04/02/2015  . Bronchitis, chronic obstructive ([place])   . Arthritis   . Hypertension   . Dysrhythmia   . Obesity   . Essential hypertension   . Status post total left knee replacement 03/15/2015  . Myeloproliferative disorder, JAK-2 positive 01/05/2014  . Essential thrombocytosis ([place]) 12/19/2013  . CMC arthritis, thumb, degenerative 06/03/2013  . Bone, giant cell tumor 06/03/2013  . GERD (gastroesophageal reflux disease) 11/19/2012  .  Hiatal hernia 11/19/2012  . Acute respiratory failure with hypoxia ([place]) 09/13/2011  . Bilateral pulmonary infiltrates on chest x-ray 09/13/2011  . Leukocytosis 09/13/2011  . Hyperglycemia 09/13/2011  . Chronic lung disease 08/16/2011    Class: Chronic  . Obesity, morbid (more than 100 lbs over ideal weight or BMI > 40) ([place]) 08/07/2011  . KNEE, ARTHRITIS, DEGEN./OSTEO 05/24/2010   Past Medical History:  Diagnosis Date  . Anemia 04/02/2015  . Arthritis   . Atypical glandular cells on vaginal Papanicolaou smear 04/12/2016   Well get colpo and biopsy with JVF  . Bronchitis, chronic obstructive ([place]) since 2000's  . Cancer [facility]    dx with endometrial cancer   . Dysrhythmia   . GERD (gastroesophageal reflux disease)   . Gout   . Heart murmur   . Hiatal hernia   . Hiatal hernia 11/19/2012  . History of endometrial cancer 04/06/2016   Sp hysterectomy 2012  . Hypertension   . Ischemic colitis ([place])    03/2015  . Myeloproliferative disorder, JAK-2 positive 01/05/2014  . Obesity   . Obesity, morbid (more than 100 lbs over ideal weight or BMI > 40) (HCC) 08/2011   Ht 5'7", wt 280 lb  . Pneumonia 09/2014  . Polycythemia vera(238.4) 01/18/2014  . PONV (postoperative nausea and vomiting)    post-op hypoxic resp failure 09/2011 and require Bi-Pap (possible r/t - pressure pulm edema vs asp PNA, COPD exacerbation, obesity hypoventilation  . Pulmonary hypertension ([place])   . Shortness of breath    unable to lay flat due to dyspnea  . Vaginal Pap smear, abnormal     Family History  Problem Relation Age of Onset  . Other Mother   . Lung cancer Father   . Hypertension Son   . Anesthesia problems Neg Hx   . Hypotension Neg Hx   . Malignant hyperthermia Neg Hx   . Pseudochol deficiency Neg Hx     Past Surgical History:  Procedure Laterality Date  . ABDOMINAL HYSTERECTOMY  09/12/2011   Procedure: HYSTERECTOMY ABDOMINAL;  Surgeon: Janie Morning, MD;  Location: [facility];  Service: Gynecology;   Laterality: N/A;  Total Abdominal Hysterectomy, Bilateral Salpingo Oophorectomy  . BREAST LUMPECTOMY Left 07/10/2017   LEFT BREAST LUMPECTOMY WITH RADIOACTIVE SEED AND SENTINEL LYMPH NODE BIOPSY   . BREAST LUMPECTOMY WITH RADIOACTIVE SEED AND SENTINEL LYMPH NODE BIOPSY Left 07/10/2017   Procedure: LEFT BREAST LUMPECTOMY WITH RADIOACTIVE SEED AND SENTINEL LYMPH NODE BIOPSY;  Surgeon: Rolm Bookbinder, MD;  Location: [place];  Service: General;  Laterality: Left;  . COLONOSCOPY  07/05/2012   Procedure: COLONOSCOPY;  Surgeon: Rogene Houston, MD;  Location: [facility];  Service: Endoscopy;  Laterality: N/A;  730  . EYE SURGERY     bilateral 8 - 12 yrs ago  . FLEXIBLE SIGMOIDOSCOPY N/A 04/03/2015   Procedure: FLEXIBLE SIGMOIDOSCOPY;  Surgeon: Rogene Houston, MD;  [facility];  Service: Endoscopy;  Laterality: N/A;  . HYSTEROSCOPY W/D&C  08/15/2011   Procedure: DILATATION AND CURETTAGE (D&C) /HYSTEROSCOPY;  Surgeon: Jonnie Kind, MD;  Location: [facility];  Service: Gynecology;  Laterality: N/A;  With Suction Curette  . JOINT REPLACEMENT     left knee replaced 2014  . KNEE ARTHROPLASTY Left 03/15/2015   Procedure: COMPUTER ASSISTED TOTAL KNEE ARTHROPLASTY;  Surgeon: Marybelle Killings, MD;  Location: [place];  Service: Orthopedics;  Laterality: Left;  . SALPINGOOPHORECTOMY  09/12/2011   Procedure: SALPINGO OOPHERECTOMY;  Surgeon: Janie Morning, MD;  Location: [facility];  Service: Gynecology;  Laterality: Bilateral;   Social History   Occupational History  . Not on file  Tobacco Use  . Smoking status: Never Smoker  . Smokeless tobacco: Never Used  Substance and Sexual Activity  . Alcohol use: No  . Drug use: No  . Sexual activity: Never    Birth control/protection: Surgical    Comment: hysterectomy     Garald Balding, MD   Note - This record has been created using [employer].  Chart creation errors have been sought, but may not always  have been located. Such creation errors  do not reflect on  the standard of medical care.

## 2019-09-03 DIAGNOSIS — J449 Chronic obstructive pulmonary disease, unspecified: Secondary | ICD-10-CM | POA: Diagnosis not present

## 2019-09-03 DIAGNOSIS — Z96652 Presence of left artificial knee joint: Secondary | ICD-10-CM | POA: Diagnosis not present

## 2019-09-03 DIAGNOSIS — J9601 Acute respiratory failure with hypoxia: Secondary | ICD-10-CM | POA: Diagnosis not present

## 2019-09-18 DIAGNOSIS — H353132 Nonexudative age-related macular degeneration, bilateral, intermediate dry stage: Secondary | ICD-10-CM | POA: Diagnosis not present

## 2019-09-18 DIAGNOSIS — H353221 Exudative age-related macular degeneration, left eye, with active choroidal neovascularization: Secondary | ICD-10-CM | POA: Diagnosis not present

## 2019-09-18 DIAGNOSIS — H43812 Vitreous degeneration, left eye: Secondary | ICD-10-CM | POA: Diagnosis not present

## 2019-09-29 DIAGNOSIS — I10 Essential (primary) hypertension: Secondary | ICD-10-CM | POA: Diagnosis not present

## 2019-09-29 DIAGNOSIS — D473 Essential (hemorrhagic) thrombocythemia: Secondary | ICD-10-CM | POA: Diagnosis not present

## 2019-09-29 DIAGNOSIS — Z79899 Other long term (current) drug therapy: Secondary | ICD-10-CM | POA: Diagnosis not present

## 2019-09-29 DIAGNOSIS — I5032 Chronic diastolic (congestive) heart failure: Secondary | ICD-10-CM | POA: Diagnosis not present

## 2019-10-04 DIAGNOSIS — J449 Chronic obstructive pulmonary disease, unspecified: Secondary | ICD-10-CM | POA: Diagnosis not present

## 2019-10-04 DIAGNOSIS — Z96652 Presence of left artificial knee joint: Secondary | ICD-10-CM | POA: Diagnosis not present

## 2019-10-04 DIAGNOSIS — J9601 Acute respiratory failure with hypoxia: Secondary | ICD-10-CM | POA: Diagnosis not present

## 2019-10-07 DIAGNOSIS — I5032 Chronic diastolic (congestive) heart failure: Secondary | ICD-10-CM | POA: Diagnosis not present

## 2019-10-07 DIAGNOSIS — E785 Hyperlipidemia, unspecified: Secondary | ICD-10-CM | POA: Diagnosis not present

## 2019-10-07 DIAGNOSIS — R7309 Other abnormal glucose: Secondary | ICD-10-CM | POA: Diagnosis not present

## 2019-10-23 DIAGNOSIS — H353132 Nonexudative age-related macular degeneration, bilateral, intermediate dry stage: Secondary | ICD-10-CM | POA: Diagnosis not present

## 2019-10-23 DIAGNOSIS — H43812 Vitreous degeneration, left eye: Secondary | ICD-10-CM | POA: Diagnosis not present

## 2019-10-23 DIAGNOSIS — H353221 Exudative age-related macular degeneration, left eye, with active choroidal neovascularization: Secondary | ICD-10-CM | POA: Diagnosis not present

## 2019-10-30 ENCOUNTER — Other Ambulatory Visit: Payer: Self-pay

## 2019-10-30 DIAGNOSIS — C50412 Malignant neoplasm of upper-outer quadrant of left female breast: Secondary | ICD-10-CM

## 2019-11-02 NOTE — Progress Notes (Signed)
Patient Care Team: [name], MD as PCP - General (Internal Medicine) Jonnie Kind, MD as Consulting Physician (Obstetrics and Gynecology) Janie Morning, MD as Attending Physician (Obstetrics and Gynecology) Carole Civil, MD as Consulting Physician (Orthopedic Surgery) Nicholas Lose, MD as Consulting Physician (Hematology and Oncology) Kyung Rudd, MD as Consulting Physician (Radiation Oncology) Rolm Bookbinder, MD as Consulting Physician (General Surgery) Delice Bison, Charlestine Massed, NP as Nurse Practitioner (Hematology and Oncology)  DIAGNOSIS:    ICD-10-CM   1. Malignant neoplasm of upper-outer quadrant of left breast in female, estrogen receptor positive ([place])  C50.412    Z17.0   2. Essential thrombocytosis (HCC)  D47.3     SUMMARY OF ONCOLOGIC HISTORY: Oncology History  Malignant neoplasm of upper-outer quadrant of left breast in female, estrogen receptor positive ([place])  07/10/2017 Surgery   Left lumpectomy: IDC grade 2, 1.3 cm, intermediate grade DCIS, margins negative, 1/2 lymph nodes positive, ER 5% positive weak staining, PR 0%, HER-2 negative ratio 1.73, Ki-67 15%, T1cN0 stage IA AJCC 8   07/10/2017 Miscellaneous   Mammaprint: Low risk, 10-year risk of recurrence untreated 10%   10/01/2017 - 11/14/2017 Radiation Therapy   Adj XRT   11/2017 -  Anti-estrogen oral therapy   Letrozole daily     CHIEF COMPLIANT: Follow-up of left breast cancer on letrozole therapyand thrombocytosis on hydroxurea  INTERVAL HISTORY: [name] is a 78 y.o. with above-mentioned history of left breast cancer treated with lumpectomy, radiation, andwhois currently on letrozole.She also has a history of JAK2 mutation and is currently on hydrea 565m daily. Mammogram on 05/20/19 showed no evidence of malignancy bilaterally. She presents to the clinicalonetoday.  She is tolerating hydroxyurea extremely well without any adverse effects.  She has had intermittent swelling  underneath the left arm especially when she exerts her arm.  REVIEW OF SYSTEMS:   Constitutional: Denies fevers, chills or abnormal weight loss Eyes: Denies blurriness of vision Ears, nose, mouth, throat, and face: Denies mucositis or sore throat Respiratory: Denies cough, dyspnea or wheezes Cardiovascular: Denies palpitation, chest discomfort Gastrointestinal: Denies nausea, heartburn or change in bowel habits Skin: Denies abnormal skin rashes Lymphatics: Denies new lymphadenopathy or easy bruising Neurological: Denies numbness, tingling or new weaknesses Behavioral/Psych: Mood is stable, no new changes  Extremities: No lower extremity edema Breast: Mild discomfort in the left axilla when she exerts arm associated with slight swelling All other systems were reviewed with the patient and are negative.  I have reviewed the past medical history, past surgical history, social history and family history with the patient and they are unchanged from previous note.  ALLERGIES:  is allergic to carbamazepine and sulfa antibiotics.  MEDICATIONS:  Current Outpatient Medications  Medication Sig Dispense Refill  . albuterol (PROVENTIL HFA;VENTOLIN HFA) 108 (90 Base) MCG/ACT inhaler Inhale 2 puffs into the lungs every 6 (six) hours as needed for wheezing or shortness of breath. Reported on 02/07/2016 (Patient not taking: Reported on 03/06/2019) 1 Inhaler 0  . aspirin EC 81 MG tablet Take 81 mg by mouth at bedtime.     .Marland Kitchenatenolol (TENORMIN) 50 MG tablet Take 25 mg by mouth 2 (two) times daily.     . cloNIDine (CATAPRES) 0.3 MG tablet Take 0.15 mg by mouth 2 (two) times daily.     .Marland Kitchendiltiazem (CARDIZEM CD) 180 MG 24 hr capsule Take 180 mg by mouth daily.     .Marland Kitchenesomeprazole (NEXIUM) 40 MG capsule Take 1 capsule (40 mg total) by mouth every morning.  30 capsule 11  . furosemide (LASIX) 20 MG tablet Take 2 tablets (40 mg total) by mouth daily. 60 tablet 0  . furosemide (LASIX) 20 MG tablet   12  .  hydroxyurea (HYDREA) 500 MG capsule Take 1 capsule (500 mg total) by mouth daily. May take with food to minimize GI side effects. 90 capsule 3  . letrozole (FEMARA) 2.5 MG tablet Take 1 tablet by mouth once daily 90 tablet 3  . losartan (COZAAR) 100 MG tablet Take 100 mg by mouth every evening.     . Simethicone (GAS RELIEF PO) Take 1 tablet by mouth daily.     . simvastatin (ZOCOR) 10 MG tablet Take 10 mg by mouth every evening.     . traMADol (ULTRAM) 50 MG tablet Take 1 tablet (50 mg total) by mouth every 6 (six) hours as needed. (Patient not taking: Reported on 03/06/2019) 60 tablet 5   No current facility-administered medications for this visit.    PHYSICAL EXAMINATION: ECOG PERFORMANCE STATUS: 1 - Symptomatic but completely ambulatory  Vitals:   11/03/19 0915  BP: 131/67  Pulse: (!) 59  Resp: 17  Temp: 98.9 F (37.2 C)  SpO2: 99%   Filed Weights   11/03/19 0915  Weight: 248 lb (112.5 kg)    GENERAL: alert, no distress and comfortable SKIN: skin color, texture, turgor are normal, no rashes or significant lesions EYES: normal, Conjunctiva are pink and non-injected, sclera clear OROPHARYNX: no exudate, no erythema and lips, buccal mucosa, and tongue normal  NECK: supple, thyroid normal size, non-tender, without nodularity LYMPH: no palpable lymphadenopathy in the cervical, axillary or inguinal LUNGS: clear to auscultation and percussion with normal breathing effort HEART: regular rate & rhythm and no murmurs and no lower extremity edema ABDOMEN: abdomen soft, non-tender and normal bowel sounds MUSCULOSKELETAL: no cyanosis of digits and no clubbing  NEURO: alert & oriented x 3 with fluent speech, no focal motor/sensory deficits EXTREMITIES: No lower extremity edema  LABORATORY DATA:  I have reviewed the data as listed CMP Latest Ref Rng & Units 01/01/2019 11/12/2017 11/11/2017  Glucose 70 - 99 mg/dL 119(H) 106(H) 107(H)  BUN 8 - 23 mg/dL 17 16 13   Creatinine 0.44 - 1.00 mg/dL  0.92 0.55 0.52  Sodium 135 - 145 mmol/L 143 139 138  Potassium 3.5 - 5.1 mmol/L 3.6 3.9 3.7  Chloride 98 - 111 mmol/L 103 94(L) 94(L)  CO2 22 - 32 mmol/L 29 37(H) 38(H)  Calcium 8.9 - 10.3 mg/dL 9.7 8.9 8.6(L)  Total Protein 6.5 - 8.1 g/dL 7.8 - -  Total Bilirubin 0.3 - 1.2 mg/dL 0.9 - -  Alkaline Phos 38 - 126 U/L 99 - -  AST 15 - 41 U/L 19 - -  ALT 0 - 44 U/L 14 - -    Lab Results  Component Value Date   WBC 8.1 11/03/2019   HGB 15.3 (H) 11/03/2019   HCT 46.7 (H) 11/03/2019   MCV 104.2 (H) 11/03/2019   PLT 484 (H) 11/03/2019   NEUTROABS 5.7 11/03/2019    ASSESSMENT & PLAN:  Malignant neoplasm of upper-outer quadrant of left breast in female, estrogen receptor positive ([place]) 07/10/2017 Left lumpectomy: IDC grade 2, 1.3 cm, intermediate grade DCIS, margins negative, 0/2 lymph nodes negative, ER 5% positive weak staining, PR 0%, HER-2 negative ratio 1.73, Ki-67 15%, T1cN0 stage IA AJCC 8  Mammaprint: Low risk luminal typeA, average 10-year risk of recurrence untreated 10% I counseled her extensively regarding the results of the  Mammaprint and provided her with a copy of the test. Postoperative breast cellulitis: Treated and resolved with antibiotics Adj XRT 10/01/17- 11/15/17  Current treatment: Adjuvant antiestrogen therapy withLetrozole 1 mg daily times 5-7 yearsstarted 11/15/2017  Letrozole toxicities:Denies any hot flashes or myalgias denies any side effects  Breast cancer surveillance: 1.Breast exam 11/03/2019: Benign 2.Mammogram 05/20/2019: Benign breast density category B  Essential thrombocytosis (HCC) Myeloproliferative disorder, JAK-2 positive Essential thrombocytosis: Lab review: 11/03/2019: Platelet count 484  Current treatment: hydroxyurea 547m dailystarted 10/01/2018 Hydroxyurea toxicities:Denies any side effects to hydroxyurea  Return to clinic in 1 year with labs and follow-up    No orders of the defined types were placed in this  encounter.  The patient has a good understanding of the overall plan. she agrees with it. she will call with any problems that may develop before the next visit here.  GNicholas Lose MD 11/03/2019  IJulious OkaDorshimer, am acting as scribe for Dr. VNicholas Lose  I have reviewed the above document for accuracy and completeness, and I agree with the above.

## 2019-11-03 ENCOUNTER — Other Ambulatory Visit: Payer: Self-pay

## 2019-11-03 ENCOUNTER — Inpatient Hospital Stay: Payer: Medicare Other

## 2019-11-03 ENCOUNTER — Inpatient Hospital Stay: Payer: Medicare Other | Attending: Hematology and Oncology | Admitting: Hematology and Oncology

## 2019-11-03 ENCOUNTER — Telehealth: Payer: Self-pay | Admitting: Hematology and Oncology

## 2019-11-03 DIAGNOSIS — C946 Myelodysplastic disease, not classified: Secondary | ICD-10-CM | POA: Insufficient documentation

## 2019-11-03 DIAGNOSIS — R7989 Other specified abnormal findings of blood chemistry: Secondary | ICD-10-CM | POA: Insufficient documentation

## 2019-11-03 DIAGNOSIS — D473 Essential (hemorrhagic) thrombocythemia: Secondary | ICD-10-CM

## 2019-11-03 DIAGNOSIS — C50412 Malignant neoplasm of upper-outer quadrant of left female breast: Secondary | ICD-10-CM

## 2019-11-03 DIAGNOSIS — Z170 Estrogen receptor positive status [ER+]: Secondary | ICD-10-CM | POA: Diagnosis not present

## 2019-11-03 DIAGNOSIS — J9601 Acute respiratory failure with hypoxia: Secondary | ICD-10-CM | POA: Diagnosis not present

## 2019-11-03 DIAGNOSIS — Z79899 Other long term (current) drug therapy: Secondary | ICD-10-CM | POA: Diagnosis not present

## 2019-11-03 DIAGNOSIS — J449 Chronic obstructive pulmonary disease, unspecified: Secondary | ICD-10-CM | POA: Diagnosis not present

## 2019-11-03 DIAGNOSIS — Z79811 Long term (current) use of aromatase inhibitors: Secondary | ICD-10-CM | POA: Insufficient documentation

## 2019-11-03 DIAGNOSIS — N61 Mastitis without abscess: Secondary | ICD-10-CM | POA: Insufficient documentation

## 2019-11-03 DIAGNOSIS — Z882 Allergy status to sulfonamides status: Secondary | ICD-10-CM | POA: Insufficient documentation

## 2019-11-03 DIAGNOSIS — Z96652 Presence of left artificial knee joint: Secondary | ICD-10-CM | POA: Diagnosis not present

## 2019-11-03 LAB — CBC WITH DIFFERENTIAL (CANCER CENTER ONLY)
Abs Immature Granulocytes: 0.02 10*3/uL (ref 0.00–0.07)
Basophils Absolute: 0.1 10*3/uL (ref 0.0–0.1)
Basophils Relative: 2 %
Eosinophils Absolute: 0.3 10*3/uL (ref 0.0–0.5)
Eosinophils Relative: 4 %
HCT: 46.7 % — ABNORMAL HIGH (ref 36.0–46.0)
Hemoglobin: 15.3 g/dL — ABNORMAL HIGH (ref 12.0–15.0)
Immature Granulocytes: 0 %
Lymphocytes Relative: 16 %
Lymphs Abs: 1.3 10*3/uL (ref 0.7–4.0)
MCH: 34.2 pg — ABNORMAL HIGH (ref 26.0–34.0)
MCHC: 32.8 g/dL (ref 30.0–36.0)
MCV: 104.2 fL — ABNORMAL HIGH (ref 80.0–100.0)
Monocytes Absolute: 0.7 10*3/uL (ref 0.1–1.0)
Monocytes Relative: 8 %
Neutro Abs: 5.7 10*3/uL (ref 1.7–7.7)
Neutrophils Relative %: 70 %
Platelet Count: 484 10*3/uL — ABNORMAL HIGH (ref 150–400)
RBC: 4.48 MIL/uL (ref 3.87–5.11)
RDW: 13.7 % (ref 11.5–15.5)
WBC Count: 8.1 10*3/uL (ref 4.0–10.5)
nRBC: 0 % (ref 0.0–0.2)

## 2019-11-03 LAB — CMP (CANCER CENTER ONLY)
ALT: 10 U/L (ref 0–44)
AST: 15 U/L (ref 15–41)
Albumin: 3.7 g/dL (ref 3.5–5.0)
Alkaline Phosphatase: 100 U/L (ref 38–126)
Anion gap: 8 (ref 5–15)
BUN: 17 mg/dL (ref 8–23)
CO2: 30 mmol/L (ref 22–32)
Calcium: 9.3 mg/dL (ref 8.9–10.3)
Chloride: 103 mmol/L (ref 98–111)
Creatinine: 0.78 mg/dL (ref 0.44–1.00)
GFR, Est AFR Am: >60 mL/min (ref >60)
GFR, Estimated: >60 mL/min (ref >60)
Glucose, Bld: 93 mg/dL (ref 70–99)
Potassium: 4.5 mmol/L (ref 3.5–5.1)
Sodium: 141 mmol/L (ref 135–145)
Total Bilirubin: 0.6 mg/dL (ref 0.3–1.2)
Total Protein: 7.3 g/dL (ref 6.5–8.1)

## 2019-11-03 MED ORDER — LETROZOLE 2.5 MG PO TABS: 2.5000 mg | ORAL_TABLET | Freq: Every day | ORAL | 3 refills | Status: DC

## 2019-11-03 MED ORDER — HYDROXYUREA 500 MG PO CAPS: 500.0000 mg | ORAL_CAPSULE | Freq: Every day | ORAL | 3 refills | Status: DC

## 2019-11-03 NOTE — Assessment & Plan Note (Signed)
07/10/2017 Left lumpectomy: IDC grade 2, 1.3 cm, intermediate grade DCIS, margins negative, 0/2 lymph nodes negative, ER 5% positive weak staining, PR 0%, [name]-2 negative ratio 1.73, Ki-67 15%, T1cN0 stage IA AJCC 8  Mammaprint: Low risk luminal typeA, average 10-year risk of recurrence untreated 10% I counseled [name] extensively regarding the results of the Mammaprint and provided [name] with a copy of the test. Postoperative breast cellulitis: Treated and resolved with antibiotics Adj XRT 10/01/17- 11/15/17  Current treatment: Adjuvant antiestrogen therapy withLetrozole 1 mg daily times 5-7 yearsstarted 11/15/2017  Letrozole toxicities:Denies any hot flashes or myalgias denies any side effects  Breast cancer surveillance: 1.Breast exam 11/03/2019: Benign 2.Mammogram 05/20/2019: Benign breast density category B

## 2019-11-03 NOTE — Assessment & Plan Note (Signed)
Myeloproliferative disorder, JAK-2 positive Essential thrombocytosis: Lab review: 11/03/2019:  Current treatment: hydroxyurea 500mg  dailystarted 10/01/2018 Hydroxyurea toxicities:Denies any side effects to hydroxyurea  Return to clinic in 6 months with labs and follow-up

## 2019-11-03 NOTE — Telephone Encounter (Signed)
Scheduled per 12/28 los, patient has been called and notified.  

## 2019-11-11 DIAGNOSIS — Z23 Encounter for immunization: Secondary | ICD-10-CM | POA: Diagnosis not present

## 2019-11-13 DIAGNOSIS — Z961 Presence of intraocular lens: Secondary | ICD-10-CM | POA: Diagnosis not present

## 2019-11-13 DIAGNOSIS — H524 Presbyopia: Secondary | ICD-10-CM | POA: Diagnosis not present

## 2019-11-13 DIAGNOSIS — H353221 Exudative age-related macular degeneration, left eye, with active choroidal neovascularization: Secondary | ICD-10-CM | POA: Diagnosis not present

## 2019-11-13 DIAGNOSIS — H353131 Nonexudative age-related macular degeneration, bilateral, early dry stage: Secondary | ICD-10-CM | POA: Diagnosis not present

## 2019-11-24 DIAGNOSIS — H43812 Vitreous degeneration, left eye: Secondary | ICD-10-CM | POA: Diagnosis not present

## 2019-11-24 DIAGNOSIS — H353221 Exudative age-related macular degeneration, left eye, with active choroidal neovascularization: Secondary | ICD-10-CM | POA: Diagnosis not present

## 2019-11-24 DIAGNOSIS — H353132 Nonexudative age-related macular degeneration, bilateral, intermediate dry stage: Secondary | ICD-10-CM | POA: Diagnosis not present

## 2019-11-24 DIAGNOSIS — H43811 Vitreous degeneration, right eye: Secondary | ICD-10-CM | POA: Diagnosis not present

## 2019-12-04 DIAGNOSIS — J449 Chronic obstructive pulmonary disease, unspecified: Secondary | ICD-10-CM | POA: Diagnosis not present

## 2019-12-04 DIAGNOSIS — Z96652 Presence of left artificial knee joint: Secondary | ICD-10-CM | POA: Diagnosis not present

## 2019-12-04 DIAGNOSIS — J9601 Acute respiratory failure with hypoxia: Secondary | ICD-10-CM | POA: Diagnosis not present

## 2019-12-14 ENCOUNTER — Ambulatory Visit: Payer: Medicare Other

## 2019-12-29 ENCOUNTER — Ambulatory Visit: Payer: Medicare Other

## 2020-01-04 DIAGNOSIS — J449 Chronic obstructive pulmonary disease, unspecified: Secondary | ICD-10-CM | POA: Diagnosis not present

## 2020-01-04 DIAGNOSIS — Z96652 Presence of left artificial knee joint: Secondary | ICD-10-CM | POA: Diagnosis not present

## 2020-01-04 DIAGNOSIS — J9601 Acute respiratory failure with hypoxia: Secondary | ICD-10-CM | POA: Diagnosis not present

## 2020-01-05 DIAGNOSIS — H353132 Nonexudative age-related macular degeneration, bilateral, intermediate dry stage: Secondary | ICD-10-CM | POA: Diagnosis not present

## 2020-01-05 DIAGNOSIS — H353221 Exudative age-related macular degeneration, left eye, with active choroidal neovascularization: Secondary | ICD-10-CM | POA: Diagnosis not present

## 2020-01-05 DIAGNOSIS — H43811 Vitreous degeneration, right eye: Secondary | ICD-10-CM | POA: Diagnosis not present

## 2020-01-05 DIAGNOSIS — H43812 Vitreous degeneration, left eye: Secondary | ICD-10-CM | POA: Diagnosis not present

## 2020-01-30 DIAGNOSIS — Z79899 Other long term (current) drug therapy: Secondary | ICD-10-CM | POA: Diagnosis not present

## 2020-01-30 DIAGNOSIS — E785 Hyperlipidemia, unspecified: Secondary | ICD-10-CM | POA: Diagnosis not present

## 2020-01-30 DIAGNOSIS — I5032 Chronic diastolic (congestive) heart failure: Secondary | ICD-10-CM | POA: Diagnosis not present

## 2020-01-30 DIAGNOSIS — R7303 Prediabetes: Secondary | ICD-10-CM | POA: Diagnosis not present

## 2020-02-01 DIAGNOSIS — J9601 Acute respiratory failure with hypoxia: Secondary | ICD-10-CM | POA: Diagnosis not present

## 2020-02-01 DIAGNOSIS — Z96652 Presence of left artificial knee joint: Secondary | ICD-10-CM | POA: Diagnosis not present

## 2020-02-01 DIAGNOSIS — J449 Chronic obstructive pulmonary disease, unspecified: Secondary | ICD-10-CM | POA: Diagnosis not present

## 2020-02-05 DIAGNOSIS — I501 Left ventricular failure: Secondary | ICD-10-CM | POA: Diagnosis not present

## 2020-02-05 DIAGNOSIS — Z853 Personal history of malignant neoplasm of breast: Secondary | ICD-10-CM | POA: Diagnosis not present

## 2020-02-16 ENCOUNTER — Ambulatory Visit (INDEPENDENT_AMBULATORY_CARE_PROVIDER_SITE_OTHER): Payer: Medicare Other | Admitting: Ophthalmology

## 2020-02-16 ENCOUNTER — Other Ambulatory Visit: Payer: Self-pay

## 2020-02-16 ENCOUNTER — Encounter (INDEPENDENT_AMBULATORY_CARE_PROVIDER_SITE_OTHER): Payer: Self-pay | Admitting: Ophthalmology

## 2020-02-16 DIAGNOSIS — H43811 Vitreous degeneration, right eye: Secondary | ICD-10-CM | POA: Insufficient documentation

## 2020-02-16 DIAGNOSIS — H35362 Drusen (degenerative) of macula, left eye: Secondary | ICD-10-CM | POA: Diagnosis not present

## 2020-02-16 DIAGNOSIS — H353221 Exudative age-related macular degeneration, left eye, with active choroidal neovascularization: Secondary | ICD-10-CM | POA: Insufficient documentation

## 2020-02-16 DIAGNOSIS — H353132 Nonexudative age-related macular degeneration, bilateral, intermediate dry stage: Secondary | ICD-10-CM | POA: Insufficient documentation

## 2020-02-16 MED ORDER — BEVACIZUMAB CHEMO INJECTION 1.25MG/0.05ML SYRINGE FOR KALEIDOSCOPE
1.2500 mg | INTRAVITREAL | Status: AC | PRN
  Administered 2020-02-16: 17:00:00 1.25 mg via INTRAVITREAL

## 2020-02-16 NOTE — Progress Notes (Signed)
02/16/2020     CHIEF COMPLAINT Patient presents for Retina Follow Up   HISTORY OF PRESENT ILLNESS: [name] is a 79 y.o. female who presents to the clinic today for:   HPI    Retina Follow Up    Patient presents with  Wet AMD.  In left eye.  Duration of 6 weeks.  Since onset it is gradually worsening.  I, the attending physician,  performed the HPI with the patient and updated documentation appropriately.          Comments    6 week follow up - OCT OU, Possible Avastin OS Patient states when she looks at the TV, its very blurry. No other complaints.        Last edited by Hurman Horn, MD on 02/16/2020  4:53 PM. (History)      Referring physician: [name], MD [address] [place],  [place] [address]  HISTORICAL INFORMATION:   Selected notes from the MEDICAL RECORD NUMBER       CURRENT MEDICATIONS: No current outpatient medications on file. (Ophthalmic Drugs)   No current facility-administered medications for this visit. (Ophthalmic Drugs)   Current Outpatient Medications (Other)  Medication Sig  . albuterol (PROVENTIL HFA;VENTOLIN HFA) 108 (90 Base) MCG/ACT inhaler Inhale 2 puffs into the lungs every 6 (six) hours as needed for wheezing or shortness of breath. Reported on 02/07/2016  . aspirin EC 81 MG tablet Take 81 mg by mouth at bedtime.   Marland Kitchen atenolol (TENORMIN) 50 MG tablet Take 25 mg by mouth 2 (two) times daily.   . cloNIDine (CATAPRES) 0.3 MG tablet Take 0.15 mg by mouth 2 (two) times daily.   Marland Kitchen diltiazem (CARDIZEM CD) 180 MG 24 hr capsule Take 180 mg by mouth daily.   Marland Kitchen esomeprazole (NEXIUM) 40 MG capsule Take 1 capsule (40 mg total) by mouth every morning.  . furosemide (LASIX) 20 MG tablet   . hydroxyurea (HYDREA) 500 MG capsule Take 1 capsule (500 mg total) by mouth daily. May take with food to minimize GI side effects.  Marland Kitchen letrozole (FEMARA) 2.5 MG tablet Take 1 tablet (2.5 mg total) by mouth daily.  Marland Kitchen losartan (COZAAR) 100 MG tablet Take  100 mg by mouth every evening.   . Simethicone (GAS RELIEF PO) Take 1 tablet by mouth daily.   . simvastatin (ZOCOR) 10 MG tablet Take 10 mg by mouth every evening.   . furosemide (LASIX) 20 MG tablet Take 2 tablets (40 mg total) by mouth daily.   No current facility-administered medications for this visit. (Other)      REVIEW OF SYSTEMS: ROS    Negative for: Constitutional, Gastrointestinal, Neurological, Skin, Genitourinary, Musculoskeletal, HENT, Endocrine, Cardiovascular, Eyes, Respiratory, Psychiatric, Allergic/Imm, Heme/Lymph   Last edited by Hurman Horn, MD on 02/16/2020  4:53 PM. (History)       ALLERGIES Allergies  Allergen Reactions  . Carbamazepine Hives and Other (See Comments)    headache  . Sulfa Antibiotics     Rash    PAST MEDICAL HISTORY Past Medical History:  Diagnosis Date  . Anemia 04/02/2015  . Arthritis   . Atypical glandular cells on vaginal Papanicolaou smear 04/12/2016   Well get colpo and biopsy with JVF  . Bronchitis, chronic obstructive ([place]) since 2000's  . Cancer [facility])    dx with endometrial cancer   . Dysrhythmia   . GERD (gastroesophageal reflux disease)   . Gout   . Heart murmur   . Hiatal  hernia   . Hiatal hernia 11/19/2012  . History of endometrial cancer 04/06/2016   Sp hysterectomy 2012  . Hypertension   . Ischemic colitis ([place])    03/2015  . Myeloproliferative disorder, JAK-2 positive 01/05/2014  . Obesity   . Obesity, morbid (more than 100 lbs over ideal weight or BMI > 40) (HCC) 08/2011   Ht 5'7", wt 280 lb  . Pneumonia 09/2014  . Polycythemia vera(238.4) 01/18/2014  . PONV (postoperative nausea and vomiting)    post-op hypoxic resp failure 09/2011 and require Bi-Pap (possible r/t - pressure pulm edema vs asp PNA, COPD exacerbation, obesity hypoventilation  . Pulmonary hypertension ([place])   . Shortness of breath    unable to lay flat due to dyspnea  . Vaginal Pap smear, abnormal    Past Surgical History:  Procedure  Laterality Date  . ABDOMINAL HYSTERECTOMY  09/12/2011   Procedure: HYSTERECTOMY ABDOMINAL;  Surgeon: Janie Morning, MD;  [facility];  Service: Gynecology;  Laterality: N/A;  Total Abdominal Hysterectomy, Bilateral Salpingo Oophorectomy  . BREAST LUMPECTOMY Left 07/10/2017   LEFT BREAST LUMPECTOMY WITH RADIOACTIVE SEED AND SENTINEL LYMPH NODE BIOPSY   . BREAST LUMPECTOMY WITH RADIOACTIVE SEED AND SENTINEL LYMPH NODE BIOPSY Left 07/10/2017   Procedure: LEFT BREAST LUMPECTOMY WITH RADIOACTIVE SEED AND SENTINEL LYMPH NODE BIOPSY;  Surgeon: Rolm Bookbinder, MD;  Location: [facility];  Service: General;  Laterality: Left;  . COLONOSCOPY  07/05/2012   Procedure: COLONOSCOPY;  Surgeon: Rogene Houston, MD;  [facility];  Service: Endoscopy;  Laterality: N/A;  730  . EYE SURGERY     bilateral 8 - 12 yrs ago  . FLEXIBLE SIGMOIDOSCOPY N/A 04/03/2015   Procedure: FLEXIBLE SIGMOIDOSCOPY;  Surgeon: Rogene Houston, MD;  [facility];  Service: Endoscopy;  Laterality: N/A;  . HYSTEROSCOPY WITH D & C  08/15/2011   Procedure: DILATATION AND CURETTAGE (D&C) /HYSTEROSCOPY;  Surgeon: Jonnie Kind, MD;  [facility];  Service: Gynecology;  Laterality: N/A;  With Suction Curette  . JOINT REPLACEMENT     left knee replaced 2014  . KNEE ARTHROPLASTY Left 03/15/2015   Procedure: COMPUTER ASSISTED TOTAL KNEE ARTHROPLASTY;  Surgeon: Marybelle Killings, MD;  Location: [facility];  Service: Orthopedics;  Laterality: Left;  . SALPINGOOPHORECTOMY  09/12/2011   Procedure: SALPINGO OOPHERECTOMY;  Surgeon: Janie Morning, MD;  [facility];  Service: Gynecology;  Laterality: Bilateral;    FAMILY HISTORY Family History  Problem Relation Age of Onset  . Other Mother   . Lung cancer Father   . Hypertension Son   . Anesthesia problems Neg Hx   . Hypotension Neg Hx   . Malignant hyperthermia Neg Hx   . Pseudochol deficiency Neg Hx     SOCIAL HISTORY Social History   Tobacco Use  . Smoking status:  Never Smoker  . Smokeless tobacco: Never Used  Substance Use Topics  . Alcohol use: No  . Drug use: No         OPHTHALMIC EXAM: Base Eye Exam    Visual Acuity (Snellen - Linear)      Right Left   Dist [place] 20/25+2 20/25-2       Tonometry (Tonopen, 3:57 PM)      Right Left   Pressure 10 9       Pupils      Pupils Dark Light Shape React APD   Right PERRL 3 2 Round Brisk None   Left PERRL 3 2 Round Brisk  None       Visual Fields (Counting fingers)      Left Right    Full Full       Extraocular Movement      Right Left    Full Full       Neuro/Psych    Oriented x3: Yes   Mood/Affect: Normal       Dilation    Left eye: 1.0% Mydriacyl, 2.5% Phenylephrine @ 3:57 PM        Slit Lamp and Fundus Exam    External Exam      Right Left   External Normal Normal       Slit Lamp Exam      Right Left   Lids/Lashes Normal Normal   Conjunctiva/Sclera White and quiet White and quiet   Cornea Clear Clear   Anterior Chamber Deep and quiet Deep and quiet   Iris Round and reactive Round and reactive   Lens Posterior chamber intraocular lens Posterior chamber intraocular lens   Vitreous Normal Normal          IMAGING AND PROCEDURES  Imaging and Procedures for 02/16/20           ASSESSMENT/PLAN:  @PROBAPNOTE @    ICD-10-CM   1. Exudative age-related macular degeneration of left eye with active choroidal neovascularization ([place])  H35.3221   2. Intermediate stage nonexudative age-related macular degeneration of both eyes  H35.3132   3. Macular drusen, left  H35.362   4. Posterior vitreous detachment of right eye  H43.811     1.  2.  3.  Ophthalmic Meds Ordered this visit:  No orders of the defined types were placed in this encounter.      No follow-ups on file.  There are no Patient Instructions on file for this visit.   Explained the diagnoses, plan, and follow up with the patient and they expressed understanding.  Patient expressed  understanding of the importance of proper follow up care.   Clent Demark Tamla Winkels M.D. Diseases & Surgery of the Retina and Vitreous Retina & Diabetic [place] 02/16/20     Abbreviations: M myopia (nearsighted); A astigmatism; H hyperopia (farsighted); P presbyopia; Mrx spectacle prescription;  CTL contact lenses; OD right eye; OS left eye; OU both eyes  XT exotropia; ET esotropia; PEK punctate epithelial keratitis; PEE punctate epithelial erosions; DES dry eye syndrome; MGD meibomian gland dysfunction; ATs artificial tears; PFAT's preservative free artificial tears; [place] nuclear sclerotic cataract; PSC posterior subcapsular cataract; ERM epi-retinal membrane; PVD posterior vitreous detachment; RD retinal detachment; DM diabetes mellitus; DR diabetic retinopathy; NPDR non-proliferative diabetic retinopathy; PDR proliferative diabetic retinopathy; CSME clinically significant macular edema; DME diabetic macular edema; dbh dot blot hemorrhages; CWS cotton wool spot; POAG primary open angle glaucoma; C/D cup-to-disc ratio; HVF humphrey visual field; GVF goldmann visual field; OCT optical coherence tomography; IOP intraocular pressure; BRVO Branch retinal vein occlusion; CRVO central retinal vein occlusion; CRAO central retinal artery occlusion; BRAO branch retinal artery occlusion; RT retinal tear; SB scleral buckle; PPV pars plana vitrectomy; VH Vitreous hemorrhage; PRP panretinal laser photocoagulation; IVK intravitreal kenalog; VMT vitreomacular traction; MH Macular hole;  NVD neovascularization of the disc; NVE neovascularization elsewhere; AREDS age related eye disease study; ARMD age related macular degeneration; POAG primary open angle glaucoma; EBMD epithelial/anterior basement membrane dystrophy; ACIOL anterior chamber intraocular lens; IOL intraocular lens; PCIOL posterior chamber intraocular lens; Phaco/IOL phacoemulsification with intraocular lens placement; PRK photorefractive keratectomy; LASIK laser  assisted in situ keratomileusis; HTN hypertension; DM diabetes  mellitus; COPD chronic obstructive pulmonary disease

## 2020-02-21 ENCOUNTER — Other Ambulatory Visit: Payer: Self-pay | Admitting: Hematology and Oncology

## 2020-03-03 DIAGNOSIS — J9601 Acute respiratory failure with hypoxia: Secondary | ICD-10-CM | POA: Diagnosis not present

## 2020-03-03 DIAGNOSIS — Z96652 Presence of left artificial knee joint: Secondary | ICD-10-CM | POA: Diagnosis not present

## 2020-03-03 DIAGNOSIS — J449 Chronic obstructive pulmonary disease, unspecified: Secondary | ICD-10-CM | POA: Diagnosis not present

## 2020-03-04 DIAGNOSIS — L820 Inflamed seborrheic keratosis: Secondary | ICD-10-CM | POA: Diagnosis not present

## 2020-03-22 ENCOUNTER — Other Ambulatory Visit: Payer: Self-pay

## 2020-03-22 ENCOUNTER — Encounter (INDEPENDENT_AMBULATORY_CARE_PROVIDER_SITE_OTHER): Payer: Self-pay | Admitting: Ophthalmology

## 2020-03-22 ENCOUNTER — Ambulatory Visit (INDEPENDENT_AMBULATORY_CARE_PROVIDER_SITE_OTHER): Payer: Medicare Other | Admitting: Ophthalmology

## 2020-03-22 DIAGNOSIS — H35362 Drusen (degenerative) of macula, left eye: Secondary | ICD-10-CM | POA: Diagnosis not present

## 2020-03-22 DIAGNOSIS — H35722 Serous detachment of retinal pigment epithelium, left eye: Secondary | ICD-10-CM

## 2020-03-22 DIAGNOSIS — H353221 Exudative age-related macular degeneration, left eye, with active choroidal neovascularization: Secondary | ICD-10-CM

## 2020-03-22 DIAGNOSIS — H353132 Nonexudative age-related macular degeneration, bilateral, intermediate dry stage: Secondary | ICD-10-CM

## 2020-03-22 DIAGNOSIS — H43811 Vitreous degeneration, right eye: Secondary | ICD-10-CM

## 2020-03-22 MED ORDER — BEVACIZUMAB CHEMO INJECTION 1.25MG/0.05ML SYRINGE FOR KALEIDOSCOPE
1.2500 mg | INTRAVITREAL | Status: AC | PRN
Start: 2020-03-22 — End: 2020-03-22
  Administered 2020-03-22: 1.25 mg via INTRAVITREAL

## 2020-03-22 NOTE — Progress Notes (Signed)
03/22/2020     CHIEF COMPLAINT Patient presents for Retina Follow Up   HISTORY OF PRESENT ILLNESS: [name] is a 79 y.o. female who presents to the clinic today for:   HPI    Retina Follow Up    Patient presents with  Wet AMD.  In left eye.  Duration of 5 weeks.  Since onset it is stable.          Comments    5 week follow up- OCT OU, Possible Avastin OS Patient denies change in vision and overall has no complaints.        Last edited by Gerda Diss on 03/22/2020  3:17 PM. (History)      Referring physician: [name], MD [address] [place],  [place] [address]  HISTORICAL INFORMATION:   Selected notes from the MEDICAL RECORD NUMBER       CURRENT MEDICATIONS: No current outpatient medications on file. (Ophthalmic Drugs)   No current facility-administered medications for this visit. (Ophthalmic Drugs)   Current Outpatient Medications (Other)  Medication Sig  . albuterol (PROVENTIL HFA;VENTOLIN HFA) 108 (90 Base) MCG/ACT inhaler Inhale 2 puffs into the lungs every 6 (six) hours as needed for wheezing or shortness of breath. Reported on 02/07/2016  . aspirin EC 81 MG tablet Take 81 mg by mouth at bedtime.   Marland Kitchen atenolol (TENORMIN) 50 MG tablet Take 25 mg by mouth 2 (two) times daily.   . cloNIDine (CATAPRES) 0.3 MG tablet Take 0.15 mg by mouth 2 (two) times daily.   Marland Kitchen diltiazem (CARDIZEM CD) 180 MG 24 hr capsule Take 180 mg by mouth daily.   Marland Kitchen esomeprazole (NEXIUM) 40 MG capsule Take 1 capsule (40 mg total) by mouth every morning.  . furosemide (LASIX) 20 MG tablet Take 2 tablets (40 mg total) by mouth daily.  . furosemide (LASIX) 20 MG tablet   . hydroxyurea (HYDREA) 500 MG capsule Take 1 capsule (500 mg total) by mouth daily. May take with food to minimize GI side effects.  Marland Kitchen letrozole (FEMARA) 2.5 MG tablet Take 1 tablet by mouth once daily  . losartan (COZAAR) 100 MG tablet Take 100 mg by mouth every evening.   . Simethicone (GAS RELIEF PO) Take  1 tablet by mouth daily.   . simvastatin (ZOCOR) 10 MG tablet Take 10 mg by mouth every evening.    No current facility-administered medications for this visit. (Other)      REVIEW OF SYSTEMS:    ALLERGIES Allergies  Allergen Reactions  . Carbamazepine Hives and Other (See Comments)    headache  . Sulfa Antibiotics     Rash    PAST MEDICAL HISTORY Past Medical History:  Diagnosis Date  . Anemia 04/02/2015  . Arthritis   . Atypical glandular cells on vaginal Papanicolaou smear 04/12/2016   Well get colpo and biopsy with JVF  . Bronchitis, chronic obstructive ([place]) since 2000's  . Cancer [facility])    dx with endometrial cancer   . Dysrhythmia   . GERD (gastroesophageal reflux disease)   . Gout   . Heart murmur   . Hiatal hernia   . Hiatal hernia 11/19/2012  . History of endometrial cancer 04/06/2016   Sp hysterectomy 2012  . Hypertension   . Ischemic colitis ([place])    03/2015  . Myeloproliferative disorder, JAK-2 positive 01/05/2014  . Obesity   . Obesity, morbid (more than 100 lbs over ideal weight or BMI > 40) (HCC) 08/2011   Ht 5'7",  wt 280 lb  . Pneumonia 09/2014  . Polycythemia vera(238.4) 01/18/2014  . PONV (postoperative nausea and vomiting)    post-op hypoxic resp failure 09/2011 and require Bi-Pap (possible r/t - pressure pulm edema vs asp PNA, COPD exacerbation, obesity hypoventilation  . Pulmonary hypertension ([place])   . Shortness of breath    unable to lay flat due to dyspnea  . Vaginal Pap smear, abnormal    Past Surgical History:  Procedure Laterality Date  . ABDOMINAL HYSTERECTOMY  09/12/2011   Procedure: HYSTERECTOMY ABDOMINAL;  Surgeon: Janie Morning, MD;  [facility];  Service: Gynecology;  Laterality: N/A;  Total Abdominal Hysterectomy, Bilateral Salpingo Oophorectomy  . BREAST LUMPECTOMY Left 07/10/2017   LEFT BREAST LUMPECTOMY WITH RADIOACTIVE SEED AND SENTINEL LYMPH NODE BIOPSY   . BREAST LUMPECTOMY WITH RADIOACTIVE SEED AND SENTINEL LYMPH NODE  BIOPSY Left 07/10/2017   Procedure: LEFT BREAST LUMPECTOMY WITH RADIOACTIVE SEED AND SENTINEL LYMPH NODE BIOPSY;  Surgeon: Rolm Bookbinder, MD;  Location: [facility];  Service: General;  Laterality: Left;  . COLONOSCOPY  07/05/2012   Procedure: COLONOSCOPY;  Surgeon: Rogene Houston, MD;  [facility];  Service: Endoscopy;  Laterality: N/A;  730  . EYE SURGERY     bilateral 8 - 12 yrs ago  . FLEXIBLE SIGMOIDOSCOPY N/A 04/03/2015   Procedure: FLEXIBLE SIGMOIDOSCOPY;  Surgeon: Rogene Houston, MD;  [facility];  Service: Endoscopy;  Laterality: N/A;  . HYSTEROSCOPY WITH D & C  08/15/2011   Procedure: DILATATION AND CURETTAGE (D&C) /HYSTEROSCOPY;  Surgeon: Jonnie Kind, MD;  [facility];  Service: Gynecology;  Laterality: N/A;  With Suction Curette  . JOINT REPLACEMENT     left knee replaced 2014  . KNEE ARTHROPLASTY Left 03/15/2015   Procedure: COMPUTER ASSISTED TOTAL KNEE ARTHROPLASTY;  Surgeon: Marybelle Killings, MD;  Location: [facility];  Service: Orthopedics;  Laterality: Left;  . SALPINGOOPHORECTOMY  09/12/2011   Procedure: SALPINGO OOPHERECTOMY;  Surgeon: Janie Morning, MD;  [facility];  Service: Gynecology;  Laterality: Bilateral;    FAMILY HISTORY Family History  Problem Relation Age of Onset  . Other Mother   . Lung cancer Father   . Hypertension Son   . Anesthesia problems Neg Hx   . Hypotension Neg Hx   . Malignant hyperthermia Neg Hx   . Pseudochol deficiency Neg Hx     SOCIAL HISTORY Social History   Tobacco Use  . Smoking status: Never Smoker  . Smokeless tobacco: Never Used  Substance Use Topics  . Alcohol use: No  . Drug use: No         OPHTHALMIC EXAM:  Base Eye Exam    Visual Acuity (Snellen - Linear)      Right Left   Dist [place] 20/25+1 20/25+2       Tonometry (Tonopen, 3:21 PM)      Right Left   Pressure 13 15       Pupils      Pupils Dark Light Shape React APD   Right PERRL 3 2 Round Slow None   Left PERRL 3 2 Round Slow  None       Visual Fields (Counting fingers)      Left Right    Full Full       Extraocular Movement      Right Left    Full Full       Neuro/Psych    Oriented x3: Yes   Mood/Affect: Normal  Dilation    Left eye: 1.0% Mydriacyl, 2.5% Phenylephrine @ 3:21 PM        Slit Lamp and Fundus Exam    External Exam      Right Left   External Normal Normal       Slit Lamp Exam      Right Left   Lids/Lashes Normal Normal   Conjunctiva/Sclera White and quiet White and quiet   Cornea Clear Clear   Anterior Chamber Deep and quiet Deep and quiet   Iris Round and reactive Round and reactive   Lens Posterior chamber intraocular lens Posterior chamber intraocular lens   Anterior Vitreous Normal Normal       Fundus Exam      Right Left   Posterior Vitreous  Posterior vitreous detachment   Disc  Normal   C/D Ratio  0.3   Macula  no macular thickening, Subretinal neovascular membrane, Retinal pigment epithelial mottling, Hard drusen, no exudates   Vessels  Normal   Periphery  Normal          IMAGING AND PROCEDURES  Imaging and Procedures for 03/22/20  OCT, Retina - OU - Both Eyes       Right Eye Quality was good. Scan locations included subfoveal. Central Foveal Thickness: 250. Progression has been stable. Findings include retinal drusen .   Left Eye Quality was good. Scan locations included subfoveal. Central Foveal Thickness: 245. Progression has improved. Findings include abnormal foveal contour, subretinal fluid, subretinal scarring.   Notes OS, stable and improved, less subretinal fluid from wet AMD on intravitreal Avastin.  Repeat OS today and examination in 5 weeks of OU and probable Avastin OS       Intravitreal Injection, Pharmacologic Agent - OS - Left Eye       Time Out 03/22/2020. 4:27 PM. Confirmed correct patient, procedure, site, and patient consented.   Anesthesia Topical anesthesia was used. Anesthetic medications included Ak[place] 3.5%.    Procedure Preparation included 10% betadine to eyelids, Ofloxacin .   Injection:  1.25 mg Bevacizumab (AVASTIN) SOLN   NDC: [id number], [id number]   Route: Intravitreal, Site: Left Eye, Waste: 0 mg  Post-op Post injection exam found visual acuity of at least counting fingers. The patient tolerated the procedure well. There were no complications. The patient received written and verbal post procedure care education. Post injection medications were not given.                 ASSESSMENT/PLAN:  Exudative age-related macular degeneration of left eye with active choroidal neovascularization (HCC) The nature of wet macular degeneration was discussed with the patient.  Forms of therapy reviewed include the use of Anti-VEGF medications injected painlessly into the eye, as well as other possible treatment modalities, including thermal laser therapy. Fellow eye involvement and risks were discussed with the patient. Upon the finding of wet age related macular degeneration, treatment will be offered. The treatment regimen is on a treat as needed basis with the intent to treat if necessary and extend interval of exams when possible. On average 1 out of 6 patients do not need lifetime therapy. However, the risk of recurrent disease is high for a lifetime.  Initially monthly, then periodic, examinations and evaluations will determine whether the next treatment is required on the day of the examination.        ICD-10-CM   1. Exudative age-related macular degeneration of left eye with active choroidal neovascularization ([place])  H35.3221 OCT, Retina - OU - Both  Eyes    Intravitreal Injection, Pharmacologic Agent - OS - Left Eye    Bevacizumab (AVASTIN) SOLN 1.25 mg  2. Retinal pigment epithelium serous detachment, left  H35.722   3. Intermediate stage nonexudative age-related macular degeneration of both eyes  H35.3132   4. Macular drusen, left  H35.362   5. Posterior vitreous detachment of right  eye  H43.811   6. Serous detachment of retinal pigment epithelium of left eye  H35.722     1.OS, stable and improved, less subretinal fluid from wet AMD on intravitreal Avastin.  Repeat OS today and examination in 5 weeks of OU and probable Avastin OS  2.  3.  Ophthalmic Meds Ordered this visit:  Meds ordered this encounter  Medications  . Bevacizumab (AVASTIN) SOLN 1.25 mg       Return in about 5 weeks (around 04/26/2020) for AVASTIN OCT, OS, DILATE OU.  There are no Patient Instructions on file for this visit.   Explained the diagnoses, plan, and follow up with the patient and they expressed understanding.  Patient expressed understanding of the importance of proper follow up care.   Clent Demark Jayra Choyce M.D. Diseases & Surgery of the Retina and Vitreous Retina & Diabetic [place] 03/22/20     Abbreviations: M myopia (nearsighted); A astigmatism; H hyperopia (farsighted); P presbyopia; Mrx spectacle prescription;  CTL contact lenses; OD right eye; OS left eye; OU both eyes  XT exotropia; ET esotropia; PEK punctate epithelial keratitis; PEE punctate epithelial erosions; DES dry eye syndrome; MGD meibomian gland dysfunction; ATs artificial tears; PFAT's preservative free artificial tears; [place] nuclear sclerotic cataract; PSC posterior subcapsular cataract; ERM epi-retinal membrane; PVD posterior vitreous detachment; RD retinal detachment; DM diabetes mellitus; DR diabetic retinopathy; NPDR non-proliferative diabetic retinopathy; PDR proliferative diabetic retinopathy; CSME clinically significant macular edema; DME diabetic macular edema; dbh dot blot hemorrhages; CWS cotton wool spot; POAG primary open angle glaucoma; C/D cup-to-disc ratio; HVF humphrey visual field; GVF goldmann visual field; OCT optical coherence tomography; IOP intraocular pressure; BRVO Branch retinal vein occlusion; CRVO central retinal vein occlusion; CRAO central retinal artery occlusion; BRAO branch retinal artery  occlusion; RT retinal tear; SB scleral buckle; PPV pars plana vitrectomy; VH Vitreous hemorrhage; PRP panretinal laser photocoagulation; IVK intravitreal kenalog; VMT vitreomacular traction; MH Macular hole;  NVD neovascularization of the disc; NVE neovascularization elsewhere; AREDS age related eye disease study; ARMD age related macular degeneration; POAG primary open angle glaucoma; EBMD epithelial/anterior basement membrane dystrophy; ACIOL anterior chamber intraocular lens; IOL intraocular lens; PCIOL posterior chamber intraocular lens; Phaco/IOL phacoemulsification with intraocular lens placement; [place] photorefractive keratectomy; LASIK laser assisted in situ keratomileusis; HTN hypertension; DM diabetes mellitus; COPD chronic obstructive pulmonary disease

## 2020-03-22 NOTE — Assessment & Plan Note (Signed)

## 2020-04-02 DIAGNOSIS — Z96652 Presence of left artificial knee joint: Secondary | ICD-10-CM | POA: Diagnosis not present

## 2020-04-02 DIAGNOSIS — J9601 Acute respiratory failure with hypoxia: Secondary | ICD-10-CM | POA: Diagnosis not present

## 2020-04-02 DIAGNOSIS — J449 Chronic obstructive pulmonary disease, unspecified: Secondary | ICD-10-CM | POA: Diagnosis not present

## 2020-04-26 ENCOUNTER — Other Ambulatory Visit: Payer: Self-pay

## 2020-04-26 ENCOUNTER — Ambulatory Visit (INDEPENDENT_AMBULATORY_CARE_PROVIDER_SITE_OTHER): Payer: Medicare Other | Admitting: Ophthalmology

## 2020-04-26 ENCOUNTER — Other Ambulatory Visit (HOSPITAL_COMMUNITY): Payer: Self-pay | Admitting: Internal Medicine

## 2020-04-26 ENCOUNTER — Encounter (INDEPENDENT_AMBULATORY_CARE_PROVIDER_SITE_OTHER): Payer: Self-pay | Admitting: Ophthalmology

## 2020-04-26 DIAGNOSIS — H353221 Exudative age-related macular degeneration, left eye, with active choroidal neovascularization: Secondary | ICD-10-CM

## 2020-04-26 DIAGNOSIS — H3561 Retinal hemorrhage, right eye: Secondary | ICD-10-CM | POA: Diagnosis not present

## 2020-04-26 DIAGNOSIS — Z9889 Other specified postprocedural states: Secondary | ICD-10-CM

## 2020-04-26 MED ORDER — BEVACIZUMAB CHEMO INJECTION 1.25MG/0.05ML SYRINGE FOR KALEIDOSCOPE
1.2500 mg | INTRAVITREAL | Status: AC | PRN
  Administered 2020-04-26: 1.25 mg via INTRAVITREAL

## 2020-04-26 NOTE — Progress Notes (Signed)
04/26/2020     CHIEF COMPLAINT Patient presents for Retina Follow Up   HISTORY OF PRESENT ILLNESS: [name] is a 79 y.o. female who presents to the clinic today for:   HPI    Retina Follow Up    Patient presents with  Wet AMD.  In left eye.  Duration of 5 weeks.          Comments    5 week fu - OCT OU, Poss Avastin OS Patient denies change in vision and overall has no complaints.        Last edited by Gerda Diss on 04/26/2020  2:28 PM. (History)      Referring physician: [name], MD [address] [place],  [place] [address]  HISTORICAL INFORMATION:   Selected notes from the MEDICAL RECORD NUMBER       CURRENT MEDICATIONS: No current outpatient medications on file. (Ophthalmic Drugs)   No current facility-administered medications for this visit. (Ophthalmic Drugs)   Current Outpatient Medications (Other)  Medication Sig  . albuterol (PROVENTIL HFA;VENTOLIN HFA) 108 (90 Base) MCG/ACT inhaler Inhale 2 puffs into the lungs every 6 (six) hours as needed for wheezing or shortness of breath. Reported on 02/07/2016  . aspirin EC 81 MG tablet Take 81 mg by mouth at bedtime.   Marland Kitchen atenolol (TENORMIN) 50 MG tablet Take 25 mg by mouth 2 (two) times daily.   . cloNIDine (CATAPRES) 0.3 MG tablet Take 0.15 mg by mouth 2 (two) times daily.   Marland Kitchen diltiazem (CARDIZEM CD) 180 MG 24 hr capsule Take 180 mg by mouth daily.   Marland Kitchen esomeprazole (NEXIUM) 40 MG capsule Take 1 capsule (40 mg total) by mouth every morning.  . furosemide (LASIX) 20 MG tablet Take 2 tablets (40 mg total) by mouth daily.  . furosemide (LASIX) 20 MG tablet   . hydroxyurea (HYDREA) 500 MG capsule Take 1 capsule (500 mg total) by mouth daily. May take with food to minimize GI side effects.  Marland Kitchen letrozole (FEMARA) 2.5 MG tablet Take 1 tablet by mouth once daily  . losartan (COZAAR) 100 MG tablet Take 100 mg by mouth every evening.   . Simethicone (GAS RELIEF PO) Take 1 tablet by mouth daily.   .  simvastatin (ZOCOR) 10 MG tablet Take 10 mg by mouth every evening.    No current facility-administered medications for this visit. (Other)      REVIEW OF SYSTEMS:    ALLERGIES Allergies  Allergen Reactions  . Carbamazepine Hives and Other (See Comments)    headache  . Sulfa Antibiotics     Rash    PAST MEDICAL HISTORY Past Medical History:  Diagnosis Date  . Anemia 04/02/2015  . Arthritis   . Atypical glandular cells on vaginal Papanicolaou smear 04/12/2016   Well get colpo and biopsy with JVF  . Bronchitis, chronic obstructive ([place]) since 2000's  . Cancer [facility])    dx with endometrial cancer   . Dysrhythmia   . GERD (gastroesophageal reflux disease)   . Gout   . Heart murmur   . Hiatal hernia   . Hiatal hernia 11/19/2012  . History of endometrial cancer 04/06/2016   Sp hysterectomy 2012  . Hypertension   . Ischemic colitis ([place])    03/2015  . Myeloproliferative disorder, JAK-2 positive 01/05/2014  . Obesity   . Obesity, morbid (more than 100 lbs over ideal weight or BMI > 40) (HCC) 08/2011   Ht 5'7", wt 280 lb  . Pneumonia  09/2014  . Polycythemia vera(238.4) 01/18/2014  . PONV (postoperative nausea and vomiting)    post-op hypoxic resp failure 09/2011 and require Bi-Pap (possible r/t - pressure pulm edema vs asp PNA, COPD exacerbation, obesity hypoventilation  . Pulmonary hypertension ([place])   . Shortness of breath    unable to lay flat due to dyspnea  . Vaginal Pap smear, abnormal    Past Surgical History:  Procedure Laterality Date  . ABDOMINAL HYSTERECTOMY  09/12/2011   Procedure: HYSTERECTOMY ABDOMINAL;  Surgeon: Janie Morning, MD;  [facility];  Service: Gynecology;  Laterality: N/A;  Total Abdominal Hysterectomy, Bilateral Salpingo Oophorectomy  . BREAST LUMPECTOMY Left 07/10/2017   LEFT BREAST LUMPECTOMY WITH RADIOACTIVE SEED AND SENTINEL LYMPH NODE BIOPSY   . BREAST LUMPECTOMY WITH RADIOACTIVE SEED AND SENTINEL LYMPH NODE BIOPSY Left 07/10/2017    Procedure: LEFT BREAST LUMPECTOMY WITH RADIOACTIVE SEED AND SENTINEL LYMPH NODE BIOPSY;  Surgeon: Rolm Bookbinder, MD;  Location: [place];  Service: General;  Laterality: Left;  . COLONOSCOPY  07/05/2012   Procedure: COLONOSCOPY;  Surgeon: Rogene Houston, MD;  [facility];  Service: Endoscopy;  Laterality: N/A;  730  . EYE SURGERY     bilateral 8 - 12 yrs ago  . FLEXIBLE SIGMOIDOSCOPY N/A 04/03/2015   Procedure: FLEXIBLE SIGMOIDOSCOPY;  Surgeon: Rogene Houston, MD;  [facility];  Service: Endoscopy;  Laterality: N/A;  . HYSTEROSCOPY WITH D & C  08/15/2011   Procedure: DILATATION AND CURETTAGE (D&C) /HYSTEROSCOPY;  Surgeon: Jonnie Kind, MD;  [facility];  Service: Gynecology;  Laterality: N/A;  With Suction Curette  . JOINT REPLACEMENT     left knee replaced 2014  . KNEE ARTHROPLASTY Left 03/15/2015   Procedure: COMPUTER ASSISTED TOTAL KNEE ARTHROPLASTY;  Surgeon: Marybelle Killings, MD;  Location: [place];  Service: Orthopedics;  Laterality: Left;  . SALPINGOOPHORECTOMY  09/12/2011   Procedure: SALPINGO OOPHERECTOMY;  Surgeon: Janie Morning, MD;  [facility];  Service: Gynecology;  Laterality: Bilateral;    FAMILY HISTORY Family History  Problem Relation Age of Onset  . Other Mother   . Lung cancer Father   . Hypertension Son   . Anesthesia problems Neg Hx   . Hypotension Neg Hx   . Malignant hyperthermia Neg Hx   . Pseudochol deficiency Neg Hx     SOCIAL HISTORY Social History   Tobacco Use  . Smoking status: Never Smoker  . Smokeless tobacco: Never Used  Vaping Use  . Vaping Use: Never used  Substance Use Topics  . Alcohol use: No  . Drug use: No         OPHTHALMIC EXAM: Base Eye Exam    Visual Acuity (Snellen - Linear)      Right Left   Dist  20/20-2 20/20-2       Tonometry (Tonopen, 2:34 PM)      Right Left   Pressure 8 7       Pupils      Pupils Dark Light Shape React APD   Right PERRL 3 2 Round Slow None   Left PERRL 3 2  Round Slow None       Visual Fields (Counting fingers)      Left Right    Full Full       Extraocular Movement      Right Left    Full Full       Neuro/Psych    Oriented x3: Yes   Mood/Affect: Normal  Dilation    Both eyes: 1.0% Mydriacyl, 2.5% Phenylephrine @ 2:35 PM        Slit Lamp and Fundus Exam    External Exam      Right Left   External Normal Normal       Slit Lamp Exam      Right Left   Lids/Lashes Normal Normal   Conjunctiva/Sclera White and quiet White and quiet   Cornea Clear Clear   Anterior Chamber Deep and quiet Deep and quiet   Iris Round and reactive Round and reactive   Lens Posterior chamber intraocular lens Posterior chamber intraocular lens   Anterior Vitreous Normal Normal       Fundus Exam      Right Left   Posterior Vitreous Normal Posterior vitreous detachment   Disc Normal.  Peripapillary hemorrhage superior to the optic nerve. Normal   C/D Ratio 0.15 0.25   Macula Hard drusen, no hemorrhage, no macular thickening no macular thickening, Subretinal neovascular membrane, Retinal pigment epithelial mottling, Hard drusen, no exudates   Vessels Normal Normal   Periphery Normal Normal          IMAGING AND PROCEDURES  Imaging and Procedures for 04/26/20  OCT, Retina - OU - Both Eyes       Right Eye Quality was good. Scan locations included subfoveal. Central Foveal Thickness: 252. Progression has no prior data. Findings include retinal drusen .   Left Eye Quality was good. Scan locations included subfoveal. Central Foveal Thickness: 249. Progression has no prior data. Findings include retinal drusen .   Notes OS, improved CNVM juxta foveal repeat intravitreal Avastin OS today and examination in 6 weeks                ASSESSMENT/PLAN:  Exudative age-related macular degeneration of left eye with active choroidal neovascularization ([place]) Will repeat intravitreal Avastin OS today as condition has stabilized and  improved.  Currently at 5-week interval.      ICD-10-CM   1. Exudative age-related macular degeneration of left eye with active choroidal neovascularization (HCC)  H35.3221 OCT, Retina - OU - Both Eyes  2. Subretinal hemorrhage of right eye  H35.61     1.  Repeat intravitreal Avastin OS today and examination in 6 weeks  2.  OD, new subretinal hemorrhage along the superotemporal arcade likely exudative macular degeneration may need antivegF started repeat examination OD in 1 week  3.  Ophthalmic Meds Ordered this visit:  No orders of the defined types were placed in this encounter.      Return in about 1 week (around 05/03/2020) for dilate, AVASTIN OCT, OD, COLOR FP.  There are no Patient Instructions on file for this visit.   Explained the diagnoses, plan, and follow up with the patient and they expressed understanding.  Patient expressed understanding of the importance of proper follow up care.   Clent Demark Wah Sabic M.D. Diseases & Surgery of the Retina and Vitreous Retina & Diabetic [place] 04/26/20     Abbreviations: M myopia (nearsighted); A astigmatism; H hyperopia (farsighted); P presbyopia; Mrx spectacle prescription;  CTL contact lenses; OD right eye; OS left eye; OU both eyes  XT exotropia; ET esotropia; PEK punctate epithelial keratitis; PEE punctate epithelial erosions; DES dry eye syndrome; MGD meibomian gland dysfunction; ATs artificial tears; PFAT's preservative free artificial tears; [place] nuclear sclerotic cataract; PSC posterior subcapsular cataract; ERM epi-retinal membrane; PVD posterior vitreous detachment; RD retinal detachment; DM diabetes mellitus; DR diabetic retinopathy; NPDR non-proliferative diabetic retinopathy;  PDR proliferative diabetic retinopathy; CSME clinically significant macular edema; DME diabetic macular edema; dbh dot blot hemorrhages; CWS cotton wool spot; POAG primary open angle glaucoma; C/D cup-to-disc ratio; HVF humphrey visual field; GVF  goldmann visual field; OCT optical coherence tomography; IOP intraocular pressure; BRVO Branch retinal vein occlusion; CRVO central retinal vein occlusion; CRAO central retinal artery occlusion; BRAO branch retinal artery occlusion; RT retinal tear; SB scleral buckle; PPV pars plana vitrectomy; VH Vitreous hemorrhage; PRP panretinal laser photocoagulation; IVK intravitreal kenalog; VMT vitreomacular traction; MH Macular hole;  NVD neovascularization of the disc; NVE neovascularization elsewhere; AREDS age related eye disease study; ARMD age related macular degeneration; POAG primary open angle glaucoma; EBMD epithelial/anterior basement membrane dystrophy; ACIOL anterior chamber intraocular lens; IOL intraocular lens; PCIOL posterior chamber intraocular lens; Phaco/IOL phacoemulsification with intraocular lens placement; [place] photorefractive keratectomy; LASIK laser assisted in situ keratomileusis; HTN hypertension; DM diabetes mellitus; COPD chronic obstructive pulmonary disease

## 2020-04-26 NOTE — Assessment & Plan Note (Signed)
Will repeat intravitreal Avastin OS today as condition has stabilized and improved.  Currently at 5-week interval.

## 2020-05-03 DIAGNOSIS — Z96652 Presence of left artificial knee joint: Secondary | ICD-10-CM | POA: Diagnosis not present

## 2020-05-03 DIAGNOSIS — J9601 Acute respiratory failure with hypoxia: Secondary | ICD-10-CM | POA: Diagnosis not present

## 2020-05-03 DIAGNOSIS — J449 Chronic obstructive pulmonary disease, unspecified: Secondary | ICD-10-CM | POA: Diagnosis not present

## 2020-05-04 ENCOUNTER — Other Ambulatory Visit: Payer: Self-pay

## 2020-05-04 ENCOUNTER — Ambulatory Visit (INDEPENDENT_AMBULATORY_CARE_PROVIDER_SITE_OTHER): Payer: Medicare Other | Admitting: Ophthalmology

## 2020-05-04 ENCOUNTER — Encounter (INDEPENDENT_AMBULATORY_CARE_PROVIDER_SITE_OTHER): Payer: Self-pay | Admitting: Ophthalmology

## 2020-05-04 DIAGNOSIS — H353211 Exudative age-related macular degeneration, right eye, with active choroidal neovascularization: Secondary | ICD-10-CM

## 2020-05-04 DIAGNOSIS — H353212 Exudative age-related macular degeneration, right eye, with inactive choroidal neovascularization: Secondary | ICD-10-CM | POA: Insufficient documentation

## 2020-05-04 DIAGNOSIS — H3561 Retinal hemorrhage, right eye: Secondary | ICD-10-CM | POA: Diagnosis not present

## 2020-05-04 MED ORDER — BEVACIZUMAB CHEMO INJECTION 1.25MG/0.05ML SYRINGE FOR KALEIDOSCOPE
1.2500 mg | INTRAVITREAL | Status: AC | PRN
  Administered 2020-05-04: 1.25 mg via INTRAVITREAL

## 2020-05-04 NOTE — Progress Notes (Signed)
05/04/2020     CHIEF COMPLAINT Patient presents for Retina Follow Up   HISTORY OF PRESENT ILLNESS: [name] is a 79 y.o. female who presents to the clinic today for:   HPI    Retina Follow Up    Patient presents with  Wet AMD.  In right eye.  Duration of 1 week.  Since onset it is stable.          Comments    1 week follow up - FP OU, OCT OU, Avastin OD Patient denies change in vision and overall has no complaints.        Last edited by Gerda Diss on 05/04/2020  3:07 PM. (History)      Referring physician: [name], MD [address] [place],  [place] [address]  HISTORICAL INFORMATION:   Selected notes from the [place]: No current outpatient medications on file. (Ophthalmic Drugs)   No current facility-administered medications for this visit. (Ophthalmic Drugs)   Current Outpatient Medications (Other)  Medication Sig  . albuterol (PROVENTIL HFA;VENTOLIN HFA) 108 (90 Base) MCG/ACT inhaler Inhale 2 puffs into the lungs every 6 (six) hours as needed for wheezing or shortness of breath. Reported on 02/07/2016  . aspirin EC 81 MG tablet Take 81 mg by mouth at bedtime.   Marland Kitchen atenolol (TENORMIN) 50 MG tablet Take 25 mg by mouth 2 (two) times daily.   . cloNIDine (CATAPRES) 0.3 MG tablet Take 0.15 mg by mouth 2 (two) times daily.   Marland Kitchen diltiazem (CARDIZEM CD) 180 MG 24 hr capsule Take 180 mg by mouth daily.   Marland Kitchen esomeprazole (NEXIUM) 40 MG capsule Take 1 capsule (40 mg total) by mouth every morning.  . furosemide (LASIX) 20 MG tablet Take 2 tablets (40 mg total) by mouth daily.  . furosemide (LASIX) 20 MG tablet   . hydroxyurea (HYDREA) 500 MG capsule Take 1 capsule (500 mg total) by mouth daily. May take with food to minimize GI side effects.  Marland Kitchen letrozole (FEMARA) 2.5 MG tablet Take 1 tablet by mouth once daily  . losartan (COZAAR) 100 MG tablet Take 100 mg by mouth every evening.   . Simethicone (GAS RELIEF PO) Take 1  tablet by mouth daily.   . simvastatin (ZOCOR) 10 MG tablet Take 10 mg by mouth every evening.    No current facility-administered medications for this visit. (Other)      REVIEW OF SYSTEMS:    ALLERGIES Allergies  Allergen Reactions  . Carbamazepine Hives and Other (See Comments)    headache  . Sulfa Antibiotics     Rash    PAST MEDICAL HISTORY Past Medical History:  Diagnosis Date  . Anemia 04/02/2015  . Arthritis   . Atypical glandular cells on vaginal Papanicolaou smear 04/12/2016   Well get colpo and biopsy with JVF  . Bronchitis, chronic obstructive ([place]) since 2000's  . Cancer [facility])    dx with endometrial cancer   . Dysrhythmia   . GERD (gastroesophageal reflux disease)   . Gout   . Heart murmur   . Hiatal hernia   . Hiatal hernia 11/19/2012  . History of endometrial cancer 04/06/2016   Sp hysterectomy 2012  . Hypertension   . Ischemic colitis ([place])    03/2015  . Myeloproliferative disorder, JAK-2 positive 01/05/2014  . Obesity   . Obesity, morbid (more than 100 lbs over ideal weight or BMI > 40) ([place]) 08/2011  Ht 5'7", wt 280 lb  . Pneumonia 09/2014  . Polycythemia vera(238.4) 01/18/2014  . PONV (postoperative nausea and vomiting)    post-op hypoxic resp failure 09/2011 and require Bi-Pap (possible r/t - pressure pulm edema vs asp PNA, COPD exacerbation, obesity hypoventilation  . Pulmonary hypertension ([place])   . Shortness of breath    unable to lay flat due to dyspnea  . Vaginal Pap smear, abnormal    Past Surgical History:  Procedure Laterality Date  . ABDOMINAL HYSTERECTOMY  09/12/2011   Procedure: HYSTERECTOMY ABDOMINAL;  Surgeon: Janie Morning, MD;  [facility];  Service: Gynecology;  Laterality: N/A;  Total Abdominal Hysterectomy, Bilateral Salpingo Oophorectomy  . BREAST LUMPECTOMY Left 07/10/2017   LEFT BREAST LUMPECTOMY WITH RADIOACTIVE SEED AND SENTINEL LYMPH NODE BIOPSY   . BREAST LUMPECTOMY WITH RADIOACTIVE SEED AND SENTINEL LYMPH NODE  BIOPSY Left 07/10/2017   Procedure: LEFT BREAST LUMPECTOMY WITH RADIOACTIVE SEED AND SENTINEL LYMPH NODE BIOPSY;  Surgeon: Rolm Bookbinder, MD;  Location: [place];  Service: General;  Laterality: Left;  . COLONOSCOPY  07/05/2012   Procedure: COLONOSCOPY;  Surgeon: Rogene Houston, MD;  [facility];  Service: Endoscopy;  Laterality: N/A;  730  . EYE SURGERY     bilateral 8 - 12 yrs ago  . FLEXIBLE SIGMOIDOSCOPY N/A 04/03/2015   Procedure: FLEXIBLE SIGMOIDOSCOPY;  Surgeon: Rogene Houston, MD;  [facility];  Service: Endoscopy;  Laterality: N/A;  . HYSTEROSCOPY WITH D & C  08/15/2011   Procedure: DILATATION AND CURETTAGE (D&C) /HYSTEROSCOPY;  Surgeon: Jonnie Kind, MD;  [facility];  Service: Gynecology;  Laterality: N/A;  With Suction Curette  . JOINT REPLACEMENT     left knee replaced 2014  . KNEE ARTHROPLASTY Left 03/15/2015   Procedure: COMPUTER ASSISTED TOTAL KNEE ARTHROPLASTY;  Surgeon: Marybelle Killings, MD;  Location: [place];  Service: Orthopedics;  Laterality: Left;  . SALPINGOOPHORECTOMY  09/12/2011   Procedure: SALPINGO OOPHERECTOMY;  Surgeon: Janie Morning, MD;  [facility];  Service: Gynecology;  Laterality: Bilateral;    FAMILY HISTORY Family History  Problem Relation Age of Onset  . Other Mother   . Lung cancer Father   . Hypertension Son   . Anesthesia problems Neg Hx   . Hypotension Neg Hx   . Malignant hyperthermia Neg Hx   . Pseudochol deficiency Neg Hx     SOCIAL HISTORY Social History   Tobacco Use  . Smoking status: Never Smoker  . Smokeless tobacco: Never Used  Vaping Use  . Vaping Use: Never used  Substance Use Topics  . Alcohol use: No  . Drug use: No         OPHTHALMIC EXAM:  Base Eye Exam    Visual Acuity (Snellen - Linear)      Right Left   Dist [place] 20/25+2 20/25+2       Tonometry (Tonopen, 3:12 PM)      Right Left   Pressure 12 11       Pupils      Pupils Dark Light Shape React APD   Right PERRL 3 2 Round  Slow None   Left PERRL 3 2 Round Slow None       Visual Fields (Counting fingers)      Left Right    Full Full       Extraocular Movement      Right Left    Full Full       Neuro/Psych  Oriented x3: Yes   Mood/Affect: Normal       Dilation    Right eye: 1.0% Mydriacyl, 2.5% Phenylephrine @ 3:12 PM        Slit Lamp and Fundus Exam    External Exam      Right Left   External Normal Normal       Slit Lamp Exam      Right Left   Lids/Lashes Normal Normal   Conjunctiva/Sclera White and quiet White and quiet   Cornea Clear Clear   Anterior Chamber Deep and quiet Deep and quiet   Iris Round and reactive Round and reactive   Lens Posterior chamber intraocular lens Posterior chamber intraocular lens   Anterior Vitreous Normal Normal       Fundus Exam      Right Left   Posterior Vitreous Normal Posterior vitreous detachment   Disc Normal.  Peripapillary hemorrhage superior to the optic nerve. Normal   C/D Ratio 0.15 0.25   Macula Hard drusen, no hemorrhage, no macular thickening no macular thickening, Subretinal neovascular membrane, Retinal pigment epithelial mottling, Hard drusen, no exudates   Vessels Normal Normal   Periphery Subretinal hemorrhage, along the superotemporal arcade Normal          IMAGING AND PROCEDURES  Imaging and Procedures for 05/04/20  Color Fundus Photography Optos - OU - Both Eyes       Right Eye Progression has been stable. Disc findings include hemorrhage. Macula : hemorrhage.   Left Eye Progression has been stable. Disc findings include normal observations. Macula : drusen.   Notes Peripapillary hemorrhage, subretinal along the superotemporal arcade       Intravitreal Injection, Pharmacologic Agent - OD - Right Eye       Time Out 05/04/2020. 4:26 PM. Confirmed correct patient, procedure, site, and patient consented.   Anesthesia Topical anesthesia was used. Anesthetic medications included Ak[place] 3.5%.    Procedure Preparation included Ofloxacin , 10% betadine to eyelids. A 30 gauge needle was used.   Injection:  1.25 mg Bevacizumab (AVASTIN) SOLN   NDC: [address]-5, Lot: [id number]   Route: Intravitreal, Site: Right Eye, Waste: 0 mg  Post-op Post injection exam found visual acuity of at least counting fingers. The patient tolerated the procedure well. There were no complications. The patient received written and verbal post procedure care education. Post injection medications were not given.                 ASSESSMENT/PLAN:  No problem-specific Assessment & Plan notes found for this encounter.      ICD-10-CM   1. Exudative age-related macular degeneration of right eye with active choroidal neovascularization (HCC)  H35.3211 Intravitreal Injection, Pharmacologic Agent - OD - Right Eye    Bevacizumab (AVASTIN) SOLN 1.25 mg  2. Subretinal hemorrhage of right eye  H35.61 Color Fundus Photography Optos - OU - Both Eyes    1.  2.  3.  Ophthalmic Meds Ordered this visit:  Meds ordered this encounter  Medications  . Bevacizumab (AVASTIN) SOLN 1.25 mg       Return in about 6 weeks (around 06/15/2020) for dilate, OD, AVASTIN OCT.  There are no Patient Instructions on file for this visit.   Explained the diagnoses, plan, and follow up with the patient and they expressed understanding.  Patient expressed understanding of the importance of proper follow up care.   Clent Demark Maricsa Sammons M.D. Diseases & Surgery of the Retina and Vitreous Retina & Diabetic [place] 05/04/20  Abbreviations: M myopia (nearsighted); A astigmatism; H hyperopia (farsighted); P presbyopia; Mrx spectacle prescription;  CTL contact lenses; OD right eye; OS left eye; OU both eyes  XT exotropia; ET esotropia; PEK punctate epithelial keratitis; PEE punctate epithelial erosions; DES dry eye syndrome; MGD meibomian gland dysfunction; ATs artificial tears; PFAT's preservative free artificial tears; [place]  nuclear sclerotic cataract; PSC posterior subcapsular cataract; ERM epi-retinal membrane; PVD posterior vitreous detachment; RD retinal detachment; DM diabetes mellitus; DR diabetic retinopathy; NPDR non-proliferative diabetic retinopathy; PDR proliferative diabetic retinopathy; CSME clinically significant macular edema; DME diabetic macular edema; dbh dot blot hemorrhages; CWS cotton wool spot; POAG primary open angle glaucoma; C/D cup-to-disc ratio; HVF humphrey visual field; GVF goldmann visual field; OCT optical coherence tomography; IOP intraocular pressure; BRVO Branch retinal vein occlusion; CRVO central retinal vein occlusion; CRAO central retinal artery occlusion; BRAO branch retinal artery occlusion; RT retinal tear; SB scleral buckle; PPV pars plana vitrectomy; VH Vitreous hemorrhage; PRP panretinal laser photocoagulation; IVK intravitreal kenalog; VMT vitreomacular traction; MH Macular hole;  NVD neovascularization of the disc; NVE neovascularization elsewhere; AREDS age related eye disease study; ARMD age related macular degeneration; POAG primary open angle glaucoma; EBMD epithelial/anterior basement membrane dystrophy; ACIOL anterior chamber intraocular lens; IOL intraocular lens; PCIOL posterior chamber intraocular lens; Phaco/IOL phacoemulsification with intraocular lens placement; [place] photorefractive keratectomy; LASIK laser assisted in situ keratomileusis; HTN hypertension; DM diabetes mellitus; COPD chronic obstructive pulmonary disease

## 2020-05-20 ENCOUNTER — Ambulatory Visit (HOSPITAL_COMMUNITY)
Admission: RE | Admit: 2020-05-20 | Discharge: 2020-05-20 | Disposition: A | Discharge status: Home / Self Care | Payer: Medicare Other | Source: Ambulatory Visit | Attending: Internal Medicine | Admitting: Internal Medicine

## 2020-05-20 ENCOUNTER — Other Ambulatory Visit: Payer: Self-pay

## 2020-05-20 DIAGNOSIS — Z1231 Encounter for screening mammogram for malignant neoplasm of breast: Secondary | ICD-10-CM | POA: Insufficient documentation

## 2020-05-20 DIAGNOSIS — Z9889 Other specified postprocedural states: Secondary | ICD-10-CM

## 2020-05-20 DIAGNOSIS — R922 Inconclusive mammogram: Secondary | ICD-10-CM | POA: Diagnosis not present

## 2020-05-20 DIAGNOSIS — R928 Other abnormal and inconclusive findings on diagnostic imaging of breast: Secondary | ICD-10-CM | POA: Diagnosis not present

## 2020-05-22 ENCOUNTER — Other Ambulatory Visit: Payer: Self-pay | Admitting: Hematology and Oncology

## 2020-06-01 DIAGNOSIS — I5032 Chronic diastolic (congestive) heart failure: Secondary | ICD-10-CM | POA: Diagnosis not present

## 2020-06-01 DIAGNOSIS — Z79899 Other long term (current) drug therapy: Secondary | ICD-10-CM | POA: Diagnosis not present

## 2020-06-02 DIAGNOSIS — Z96652 Presence of left artificial knee joint: Secondary | ICD-10-CM | POA: Diagnosis not present

## 2020-06-02 DIAGNOSIS — J9601 Acute respiratory failure with hypoxia: Secondary | ICD-10-CM | POA: Diagnosis not present

## 2020-06-02 DIAGNOSIS — J449 Chronic obstructive pulmonary disease, unspecified: Secondary | ICD-10-CM | POA: Diagnosis not present

## 2020-06-07 ENCOUNTER — Encounter (INDEPENDENT_AMBULATORY_CARE_PROVIDER_SITE_OTHER): Payer: Medicare Other | Admitting: Ophthalmology

## 2020-06-07 DIAGNOSIS — I10 Essential (primary) hypertension: Secondary | ICD-10-CM | POA: Diagnosis not present

## 2020-06-07 DIAGNOSIS — I5032 Chronic diastolic (congestive) heart failure: Secondary | ICD-10-CM | POA: Diagnosis not present

## 2020-06-10 ENCOUNTER — Encounter (INDEPENDENT_AMBULATORY_CARE_PROVIDER_SITE_OTHER): Payer: Self-pay | Admitting: Ophthalmology

## 2020-06-10 ENCOUNTER — Ambulatory Visit (INDEPENDENT_AMBULATORY_CARE_PROVIDER_SITE_OTHER): Payer: Medicare Other | Admitting: Ophthalmology

## 2020-06-10 ENCOUNTER — Other Ambulatory Visit: Payer: Self-pay

## 2020-06-10 DIAGNOSIS — H353211 Exudative age-related macular degeneration, right eye, with active choroidal neovascularization: Secondary | ICD-10-CM | POA: Diagnosis not present

## 2020-06-10 DIAGNOSIS — H353221 Exudative age-related macular degeneration, left eye, with active choroidal neovascularization: Secondary | ICD-10-CM | POA: Diagnosis not present

## 2020-06-10 MED ORDER — BEVACIZUMAB CHEMO INJECTION 1.25MG/0.05ML SYRINGE FOR KALEIDOSCOPE
1.2500 mg | INTRAVITREAL | Status: AC | PRN
  Administered 2020-06-10: 1.25 mg via INTRAVITREAL

## 2020-06-10 NOTE — Assessment & Plan Note (Signed)
Follow-up OD as scheduled °

## 2020-06-10 NOTE — Assessment & Plan Note (Signed)
OS, chronic subretinal fluid nasal to the foveal region while on intravitreal Avastin 6-week interval will repeat today, and exam in 6 weeks

## 2020-06-10 NOTE — Progress Notes (Signed)
06/10/2020     CHIEF COMPLAINT Patient presents for Retina Follow Up   HISTORY OF PRESENT ILLNESS: [name] is a 79 y.o. female who presents to the clinic today for:   HPI    Retina Follow Up    Patient presents with  Wet AMD.  In left eye.  Severity is moderate.  Duration of 6.5 weeks.  Since onset it is stable.  I, the attending physician,  performed the HPI with the patient and updated documentation appropriately.          Comments    6.5 Week Wet AMD f\u OS. Possibe Avastin OS. OCT  Pt c/o things at a certain distance being blurry, where as before she could see them.         Last edited by [name] on 06/10/2020  2:27 PM. (History)      Referring physician: [name], MD [address] [place],  [place] [address]  HISTORICAL INFORMATION:   Selected notes from the MEDICAL RECORD NUMBER       CURRENT MEDICATIONS: No current outpatient medications on file. (Ophthalmic Drugs)   No current facility-administered medications for this visit. (Ophthalmic Drugs)   Current Outpatient Medications (Other)  Medication Sig  . albuterol (PROVENTIL HFA;VENTOLIN HFA) 108 (90 Base) MCG/ACT inhaler Inhale 2 puffs into the lungs every 6 (six) hours as needed for wheezing or shortness of breath. Reported on 02/07/2016  . aspirin EC 81 MG tablet Take 81 mg by mouth at bedtime.   Marland Kitchen atenolol (TENORMIN) 50 MG tablet Take 25 mg by mouth 2 (two) times daily.   . cloNIDine (CATAPRES) 0.3 MG tablet Take 0.15 mg by mouth 2 (two) times daily.   Marland Kitchen diltiazem (CARDIZEM CD) 180 MG 24 hr capsule Take 180 mg by mouth daily.   Marland Kitchen esomeprazole (NEXIUM) 40 MG capsule Take 1 capsule (40 mg total) by mouth every morning.  . furosemide (LASIX) 20 MG tablet Take 2 tablets (40 mg total) by mouth daily.  . furosemide (LASIX) 20 MG tablet   . hydroxyurea (HYDREA) 500 MG capsule Take 1 capsule (500 mg total) by mouth daily. May take with food to minimize GI side effects.  Marland Kitchen letrozole  (FEMARA) 2.5 MG tablet Take 1 tablet by mouth once daily  . losartan (COZAAR) 100 MG tablet Take 100 mg by mouth every evening.   . Simethicone (GAS RELIEF PO) Take 1 tablet by mouth daily.   . simvastatin (ZOCOR) 10 MG tablet Take 10 mg by mouth every evening.    No current facility-administered medications for this visit. (Other)      REVIEW OF SYSTEMS:    ALLERGIES Allergies  Allergen Reactions  . Carbamazepine Hives and Other (See Comments)    headache  . Sulfa Antibiotics     Rash    PAST MEDICAL HISTORY Past Medical History:  Diagnosis Date  . Anemia 04/02/2015  . Arthritis   . Atypical glandular cells on vaginal Papanicolaou smear 04/12/2016   Well get colpo and biopsy with JVF  . Bronchitis, chronic obstructive ([place]) since 2000's  . Cancer [facility])    dx with endometrial cancer   . Dysrhythmia   . GERD (gastroesophageal reflux disease)   . Gout   . Heart murmur   . Hiatal hernia   . Hiatal hernia 11/19/2012  . History of endometrial cancer 04/06/2016   Sp hysterectomy 2012  . Hypertension   . Ischemic colitis ([place])    03/2015  .  Myeloproliferative disorder, JAK-2 positive 01/05/2014  . Obesity   . Obesity, morbid (more than 100 lbs over ideal weight or BMI > 40) (HCC) 08/2011   Ht 5'7", wt 280 lb  . Pneumonia 09/2014  . Polycythemia vera(238.4) 01/18/2014  . PONV (postoperative nausea and vomiting)    post-op hypoxic resp failure 09/2011 and require Bi-Pap (possible r/t - pressure pulm edema vs asp PNA, COPD exacerbation, obesity hypoventilation  . Pulmonary hypertension ([place])   . Shortness of breath    unable to lay flat due to dyspnea  . Vaginal Pap smear, abnormal    Past Surgical History:  Procedure Laterality Date  . ABDOMINAL HYSTERECTOMY  09/12/2011   Procedure: HYSTERECTOMY ABDOMINAL;  Surgeon: Janie Morning, MD;  [facility];  Service: Gynecology;  Laterality: N/A;  Total Abdominal Hysterectomy, Bilateral Salpingo Oophorectomy  . BREAST LUMPECTOMY  Left 07/10/2017   LEFT BREAST LUMPECTOMY WITH RADIOACTIVE SEED AND SENTINEL LYMPH NODE BIOPSY   . BREAST LUMPECTOMY WITH RADIOACTIVE SEED AND SENTINEL LYMPH NODE BIOPSY Left 07/10/2017   Procedure: LEFT BREAST LUMPECTOMY WITH RADIOACTIVE SEED AND SENTINEL LYMPH NODE BIOPSY;  Surgeon: Rolm Bookbinder, MD;  Location: [place];  Service: General;  Laterality: Left;  . COLONOSCOPY  07/05/2012   Procedure: COLONOSCOPY;  Surgeon: Rogene Houston, MD;  [facility];  Service: Endoscopy;  Laterality: N/A;  730  . EYE SURGERY     bilateral 8 - 12 yrs ago  . FLEXIBLE SIGMOIDOSCOPY N/A 04/03/2015   Procedure: FLEXIBLE SIGMOIDOSCOPY;  Surgeon: Rogene Houston, MD;  [facility];  Service: Endoscopy;  Laterality: N/A;  . HYSTEROSCOPY WITH D & C  08/15/2011   Procedure: DILATATION AND CURETTAGE (D&C) /HYSTEROSCOPY;  Surgeon: Jonnie Kind, MD;  [facility];  Service: Gynecology;  Laterality: N/A;  With Suction Curette  . JOINT REPLACEMENT     left knee replaced 2014  . KNEE ARTHROPLASTY Left 03/15/2015   Procedure: COMPUTER ASSISTED TOTAL KNEE ARTHROPLASTY;  Surgeon: Marybelle Killings, MD;  Location: [place];  Service: Orthopedics;  Laterality: Left;  . SALPINGOOPHORECTOMY  09/12/2011   Procedure: SALPINGO OOPHERECTOMY;  Surgeon: Janie Morning, MD;  [facility];  Service: Gynecology;  Laterality: Bilateral;    FAMILY HISTORY Family History  Problem Relation Age of Onset  . Other Mother   . Lung cancer Father   . Hypertension Son   . Anesthesia problems Neg Hx   . Hypotension Neg Hx   . Malignant hyperthermia Neg Hx   . Pseudochol deficiency Neg Hx     SOCIAL HISTORY Social History   Tobacco Use  . Smoking status: Never Smoker  . Smokeless tobacco: Never Used  Vaping Use  . Vaping Use: Never used  Substance Use Topics  . Alcohol use: No  . Drug use: No         OPHTHALMIC EXAM: Base Eye Exam    Visual Acuity (Snellen - Linear)      Right Left   Dist [place] 20/20 -1  20/25 -1       Tonometry (Tonopen, 2:32 PM)      Right Left   Pressure 8 7       Pupils      Pupils Dark Light Shape React APD   Right PERRL 3 2 Round Brisk None   Left PERRL 3 2 Round Brisk None       Visual Fields (Counting fingers)      Left Right    Full Full  Neuro/Psych    Oriented x3: Yes   Mood/Affect: Normal       Dilation    Left eye: 1.0% Mydriacyl, 2.5% Phenylephrine @ 2:32 PM        Slit Lamp and Fundus Exam    External Exam      Right Left   External Normal Normal       Slit Lamp Exam      Right Left   Lids/Lashes Normal Normal   Conjunctiva/Sclera White and quiet White and quiet   Cornea Clear Clear   Anterior Chamber Deep and quiet Deep and quiet   Iris Round and reactive Round and reactive   Lens Posterior chamber intraocular lens Posterior chamber intraocular lens   Anterior Vitreous Normal Normal       Fundus Exam      Right Left   Posterior Vitreous  Posterior vitreous detachment   Disc  Normal   C/D Ratio  0.25   Macula  no macular thickening, Subretinal neovascular membrane, Retinal pigment epithelial mottling, Hard drusen, no exudates   Vessels  Normal   Periphery  Normal          IMAGING AND PROCEDURES  Imaging and Procedures for 06/10/20  OCT, Retina - OU - Both Eyes       Right Eye Quality was good. Scan locations included subfoveal. Central Foveal Thickness: 252. Findings include abnormal foveal contour, myopic contour.   Left Eye Quality was good. Scan locations included subfoveal. Central Foveal Thickness: 248. Findings include subretinal fluid.   Notes OD, much improved.  OS with subretinal fluid nasal to the fovea overlying pigment epithelial detachment.       Intravitreal Injection, Pharmacologic Agent - OS - Left Eye       Time Out 06/10/2020. 2:55 PM. Confirmed correct patient, procedure, site, and patient consented.   Anesthesia Topical anesthesia was used. Anesthetic medications included  Akten 3.5%.   Procedure Preparation included Ofloxacin , 10% betadine to eyelids, 5% betadine to ocular surface. A 30 gauge needle was used.   Injection:  1.25 mg Bevacizumab (AVASTIN) SOLN   NDC: [address]-1, Lot: [id number]   Route: Intravitreal, Site: Left Eye, Waste: 0 mg  Post-op Post injection exam found visual acuity of at least counting fingers. The patient tolerated the procedure well. There were no complications. The patient received written and verbal post procedure care education. Post injection medications were not given.                 ASSESSMENT/PLAN:  Exudative age-related macular degeneration of left eye with active choroidal neovascularization (HCC) OS, chronic subretinal fluid nasal to the foveal region while on intravitreal Avastin 6-week interval will repeat today, and exam in 6 weeks  Exudative age-related macular degeneration of right eye with active choroidal neovascularization ([place]) Follow-up OD as scheduled      ICD-10-CM   1. Exudative age-related macular degeneration of left eye with active choroidal neovascularization (HCC)  H35.3221 OCT, Retina - OU - Both Eyes    Intravitreal Injection, Pharmacologic Agent - OS - Left Eye    Bevacizumab (AVASTIN) SOLN 1.25 mg  2. Exudative age-related macular degeneration of right eye with active choroidal neovascularization ([place])  H35.3211     1.  2.  3.  Ophthalmic Meds Ordered this visit:  Meds ordered this encounter  Medications  . Bevacizumab (AVASTIN) SOLN 1.25 mg       Return in about 6 weeks (around 07/22/2020) for DILATE OU, AVASTIN OCT, OS.  There are no Patient Instructions on file for this visit.   Explained the diagnoses, plan, and follow up with the patient and they expressed understanding.  Patient expressed understanding of the importance of proper follow up care.   Clent Demark Govani Radloff M.D. Diseases & Surgery of the Retina and Vitreous Retina & Diabetic [place] 06/10/20     Abbreviations: M myopia (nearsighted); A astigmatism; H hyperopia (farsighted); P presbyopia; Mrx spectacle prescription;  CTL contact lenses; OD right eye; OS left eye; OU both eyes  XT exotropia; ET esotropia; PEK punctate epithelial keratitis; PEE punctate epithelial erosions; DES dry eye syndrome; MGD meibomian gland dysfunction; ATs artificial tears; PFAT's preservative free artificial tears; [place] nuclear sclerotic cataract; PSC posterior subcapsular cataract; ERM epi-retinal membrane; PVD posterior vitreous detachment; RD retinal detachment; DM diabetes mellitus; DR diabetic retinopathy; NPDR non-proliferative diabetic retinopathy; PDR proliferative diabetic retinopathy; CSME clinically significant macular edema; DME diabetic macular edema; dbh dot blot hemorrhages; CWS cotton wool spot; POAG primary open angle glaucoma; C/D cup-to-disc ratio; HVF humphrey visual field; GVF goldmann visual field; OCT optical coherence tomography; IOP intraocular pressure; BRVO Branch retinal vein occlusion; CRVO central retinal vein occlusion; CRAO central retinal artery occlusion; BRAO branch retinal artery occlusion; RT retinal tear; SB scleral buckle; PPV pars plana vitrectomy; VH Vitreous hemorrhage; PRP panretinal laser photocoagulation; IVK intravitreal kenalog; VMT vitreomacular traction; MH Macular hole;  NVD neovascularization of the disc; NVE neovascularization elsewhere; AREDS age related eye disease study; ARMD age related macular degeneration; POAG primary open angle glaucoma; EBMD epithelial/anterior basement membrane dystrophy; ACIOL anterior chamber intraocular lens; IOL intraocular lens; PCIOL posterior chamber intraocular lens; Phaco/IOL phacoemulsification with intraocular lens placement; [place] photorefractive keratectomy; LASIK laser assisted in situ keratomileusis; HTN hypertension; DM diabetes mellitus; COPD chronic obstructive pulmonary disease

## 2020-06-15 ENCOUNTER — Encounter (INDEPENDENT_AMBULATORY_CARE_PROVIDER_SITE_OTHER): Payer: Medicare Other | Admitting: Ophthalmology

## 2020-06-17 ENCOUNTER — Other Ambulatory Visit: Payer: Self-pay

## 2020-06-17 ENCOUNTER — Ambulatory Visit (INDEPENDENT_AMBULATORY_CARE_PROVIDER_SITE_OTHER): Payer: Medicare Other | Admitting: Ophthalmology

## 2020-06-17 ENCOUNTER — Encounter (INDEPENDENT_AMBULATORY_CARE_PROVIDER_SITE_OTHER): Payer: Self-pay | Admitting: Ophthalmology

## 2020-06-17 DIAGNOSIS — H353211 Exudative age-related macular degeneration, right eye, with active choroidal neovascularization: Secondary | ICD-10-CM

## 2020-06-17 DIAGNOSIS — H353221 Exudative age-related macular degeneration, left eye, with active choroidal neovascularization: Secondary | ICD-10-CM

## 2020-06-17 MED ORDER — BEVACIZUMAB CHEMO INJECTION 1.25MG/0.05ML SYRINGE FOR KALEIDOSCOPE
1.2500 mg | INTRAVITREAL | Status: AC | PRN
  Administered 2020-06-17: 1.25 mg via INTRAVITREAL

## 2020-06-17 NOTE — Progress Notes (Signed)
06/17/2020     CHIEF COMPLAINT Patient presents for Retina Follow Up   HISTORY OF PRESENT ILLNESS: [name] is a 79 y.o. female who presents to the clinic today for:   HPI    Retina Follow Up    Patient presents with  Wet AMD.  In right eye.  Severity is moderate.  Duration of 6 weeks.  Since onset it is stable.          Comments    6 Week AMD f\u OD. Possible Avastin OD. OCT  Pt states no changes in vision. Denies any complaints.       Last edited by [name] on 06/17/2020  2:06 PM. (History)      Referring physician: [name], MD [address] [place],  [place] [address]  HISTORICAL INFORMATION:   Selected notes from the [place]: No current outpatient medications on file. (Ophthalmic Drugs)   No current facility-administered medications for this visit. (Ophthalmic Drugs)   Current Outpatient Medications (Other)  Medication Sig   albuterol (PROVENTIL HFA;VENTOLIN HFA) 108 (90 Base) MCG/ACT inhaler Inhale 2 puffs into the lungs every 6 (six) hours as needed for wheezing or shortness of breath. Reported on 02/07/2016   aspirin EC 81 MG tablet Take 81 mg by mouth at bedtime.    atenolol (TENORMIN) 50 MG tablet Take 25 mg by mouth 2 (two) times daily.    cloNIDine (CATAPRES) 0.3 MG tablet Take 0.15 mg by mouth 2 (two) times daily.    diltiazem (CARDIZEM CD) 180 MG 24 hr capsule Take 180 mg by mouth daily.    esomeprazole (NEXIUM) 40 MG capsule Take 1 capsule (40 mg total) by mouth every morning.   furosemide (LASIX) 20 MG tablet Take 2 tablets (40 mg total) by mouth daily.   furosemide (LASIX) 20 MG tablet    hydroxyurea (HYDREA) 500 MG capsule Take 1 capsule (500 mg total) by mouth daily. May take with food to minimize GI side effects.   letrozole (FEMARA) 2.5 MG tablet Take 1 tablet by mouth once daily   losartan (COZAAR) 100 MG tablet Take 100 mg by mouth every evening.    Simethicone  (GAS RELIEF PO) Take 1 tablet by mouth daily.    simvastatin (ZOCOR) 10 MG tablet Take 10 mg by mouth every evening.    No current facility-administered medications for this visit. (Other)      REVIEW OF SYSTEMS:    ALLERGIES Allergies  Allergen Reactions   Carbamazepine Hives and Other (See Comments)    headache   Sulfa Antibiotics     Rash    PAST MEDICAL HISTORY Past Medical History:  Diagnosis Date   Anemia 04/02/2015   Arthritis    Atypical glandular cells on vaginal Papanicolaou smear 04/12/2016   Well get colpo and biopsy with JVF   Bronchitis, chronic obstructive ([place]) since 2000's   Cancer ([place])    dx with endometrial cancer    Dysrhythmia    GERD (gastroesophageal reflux disease)    Gout    Heart murmur    Hiatal hernia    Hiatal hernia 11/19/2012   History of endometrial cancer 04/06/2016   Sp hysterectomy 2012   Hypertension    Ischemic colitis ([place])    03/2015   Myeloproliferative disorder, JAK-2 positive 01/05/2014   Obesity    Obesity, morbid (more than 100 lbs over ideal weight or BMI > 40) ([place]) 08/2011  Ht 5'7", wt 280 lb   Pneumonia 09/2014   Polycythemia vera(238.4) 01/18/2014   PONV (postoperative nausea and vomiting)    post-op hypoxic resp failure 09/2011 and require Bi-Pap (possible r/t - pressure pulm edema vs asp PNA, COPD exacerbation, obesity hypoventilation   Pulmonary hypertension (HCC)    Shortness of breath    unable to lay flat due to dyspnea   Vaginal Pap smear, abnormal    Past Surgical History:  Procedure Laterality Date   ABDOMINAL HYSTERECTOMY  09/12/2011   Procedure: HYSTERECTOMY ABDOMINAL;  Surgeon: Janie Morning, MD;  [facility];  Service: Gynecology;  Laterality: N/A;  Total Abdominal Hysterectomy, Bilateral Salpingo Oophorectomy   BREAST LUMPECTOMY Left 07/10/2017   LEFT BREAST LUMPECTOMY WITH RADIOACTIVE SEED AND SENTINEL LYMPH NODE BIOPSY    BREAST LUMPECTOMY WITH RADIOACTIVE SEED AND  SENTINEL LYMPH NODE BIOPSY Left 07/10/2017   Procedure: LEFT BREAST LUMPECTOMY WITH RADIOACTIVE SEED AND SENTINEL LYMPH NODE BIOPSY;  Surgeon: Rolm Bookbinder, MD;  Location: [place];  Service: General;  Laterality: Left;   COLONOSCOPY  07/05/2012   Procedure: COLONOSCOPY;  Surgeon: Rogene Houston, MD;  [facility];  Service: Endoscopy;  Laterality: N/A;  730   EYE SURGERY     bilateral 8 - 12 yrs ago   FLEXIBLE SIGMOIDOSCOPY N/A 04/03/2015   Procedure: FLEXIBLE SIGMOIDOSCOPY;  Surgeon: Rogene Houston, MD;  [facility];  Service: Endoscopy;  Laterality: N/A;   HYSTEROSCOPY WITH D & C  08/15/2011   Procedure: DILATATION AND CURETTAGE (D&C) /HYSTEROSCOPY;  Surgeon: Jonnie Kind, MD;  [facility];  Service: Gynecology;  Laterality: N/A;  With Suction Curette   JOINT REPLACEMENT     left knee replaced 2014   KNEE ARTHROPLASTY Left 03/15/2015   Procedure: COMPUTER ASSISTED TOTAL KNEE ARTHROPLASTY;  Surgeon: Marybelle Killings, MD;  Location: [place];  Service: Orthopedics;  Laterality: Left;   SALPINGOOPHORECTOMY  09/12/2011   Procedure: SALPINGO OOPHERECTOMY;  Surgeon: Janie Morning, MD;  [facility];  Service: Gynecology;  Laterality: Bilateral;    FAMILY HISTORY Family History  Problem Relation Age of Onset   Other Mother    Lung cancer Father    Hypertension Son    Anesthesia problems Neg Hx    Hypotension Neg Hx    Malignant hyperthermia Neg Hx    Pseudochol deficiency Neg Hx     SOCIAL HISTORY Social History   Tobacco Use   Smoking status: Never Smoker   Smokeless tobacco: Never Used  Vaping Use   Vaping Use: Never used  Substance Use Topics   Alcohol use: No   Drug use: No         OPHTHALMIC EXAM:  Base Eye Exam    Visual Acuity (Snellen - Linear)      Right Left   Dist [place] 20/25 + 20/25 -1       Tonometry (Tonopen, 2:10 PM)      Right Left   Pressure 8 9       Pupils      Pupils Dark Light Shape React APD    Right PERRL 3 2 Round Slow None   Left PERRL 3 2 Round Slow None       Visual Fields (Counting fingers)      Left Right    Full Full       Neuro/Psych    Oriented x3: Yes   Mood/Affect: Normal       Dilation  Right eye: 1.0% Mydriacyl, 2.5% Phenylephrine @ 2:10 PM        Slit Lamp and Fundus Exam    External Exam      Right Left   External Normal Normal       Slit Lamp Exam      Right Left   Lids/Lashes Normal Normal   Conjunctiva/Sclera White and quiet White and quiet   Cornea Clear Clear   Anterior Chamber Deep and quiet Deep and quiet   Iris Round and reactive Round and reactive   Lens Posterior chamber intraocular lens Posterior chamber intraocular lens   Anterior Vitreous Normal Normal       Fundus Exam      Right Left   Posterior Vitreous Normal    Disc Normal.  Peripapillary hemorrhage superior to the optic nerve, somewhat fading, less thickness to this area    C/D Ratio 0.15    Macula Hard drusen, no hemorrhage, no macular thickening    Vessels Normal    Periphery Subretinal hemorrhage, along the superotemporal arcade           IMAGING AND PROCEDURES  Imaging and Procedures for 06/17/20  OCT, Retina - OU - Both Eyes       Right Eye Quality was good. Scan locations included subfoveal. Central Foveal Thickness: 256. Progression has improved. Findings include retinal drusen , subretinal scarring.   Left Eye Quality was good. Scan locations included subfoveal. Central Foveal Thickness: 246. Progression has improved. Findings include retinal drusen , subretinal scarring.   Notes OD much less retinal thickening superonasal to the macula, will repeat injection OD today to preserve visual function from PERI papillary CNVM and in examination in 7 weeks       Intravitreal Injection, Pharmacologic Agent - OD - Right Eye       Time Out 06/17/2020. 3:09 PM. Confirmed correct patient, procedure, site, and patient consented.   Anesthesia Topical  anesthesia was used. Anesthetic medications included Akten 3.5%.   Procedure Preparation included Tobramycin 0.3%, Ofloxacin , 10% betadine to eyelids, 5% betadine to ocular surface. A 30 gauge needle was used.   Injection:  1.25 mg Bevacizumab (AVASTIN) SOLN   NDC: [address]-5, Lot: [id number]   Route: Intravitreal, Site: Right Eye, Waste: 0 mg  Post-op Post injection exam found visual acuity of at least counting fingers. The patient tolerated the procedure well. There were no complications. The patient received written and verbal post procedure care education. Post injection medications were not given.                 ASSESSMENT/PLAN:  Exudative age-related macular degeneration of left eye with active choroidal neovascularization (HCC) OS improved 1 week status post intravitreal Avastin  Exudative age-related macular degeneration of right eye with active choroidal neovascularization (HCC) D, improved at 6-week interval.  We will repeat intravitreal Avastin today and examination in 7 weeks      ICD-10-CM   1. Exudative age-related macular degeneration of right eye with active choroidal neovascularization (HCC)  H35.3211 OCT, Retina - OU - Both Eyes    Intravitreal Injection, Pharmacologic Agent - OD - Right Eye    Bevacizumab (AVASTIN) SOLN 1.25 mg  2. Exudative age-related macular degeneration of left eye with active choroidal neovascularization ([place])  H35.3221     1.  2.  3.  Ophthalmic Meds Ordered this visit:  Meds ordered this encounter  Medications   Bevacizumab (AVASTIN) SOLN 1.25 mg       Return in about  7 weeks (around 08/05/2020) for dilate, OD, AVASTIN OCT.  There are no Patient Instructions on file for this visit.   Explained the diagnoses, plan, and follow up with the patient and they expressed understanding.  Patient expressed understanding of the importance of proper follow up care.   Clent Demark Williamson [place] M.D. Diseases & Surgery of the Retina and  Vitreous Retina & Diabetic [place] 06/17/20     Abbreviations: M myopia (nearsighted); A astigmatism; H hyperopia (farsighted); P presbyopia; Mrx spectacle prescription;  CTL contact lenses; OD right eye; OS left eye; OU both eyes  XT exotropia; ET esotropia; PEK punctate epithelial keratitis; PEE punctate epithelial erosions; DES dry eye syndrome; MGD meibomian gland dysfunction; ATs artificial tears; PFAT's preservative free artificial tears; [place] nuclear sclerotic cataract; PSC posterior subcapsular cataract; ERM epi-retinal membrane; PVD posterior vitreous detachment; RD retinal detachment; DM diabetes mellitus; DR diabetic retinopathy; NPDR non-proliferative diabetic retinopathy; PDR proliferative diabetic retinopathy; CSME clinically significant macular edema; DME diabetic macular edema; dbh dot blot hemorrhages; CWS cotton wool spot; POAG primary open angle glaucoma; C/D cup-to-disc ratio; HVF humphrey visual field; GVF goldmann visual field; OCT optical coherence tomography; IOP intraocular pressure; BRVO Branch retinal vein occlusion; CRVO central retinal vein occlusion; CRAO central retinal artery occlusion; BRAO branch retinal artery occlusion; RT retinal tear; SB scleral buckle; PPV pars plana vitrectomy; VH Vitreous hemorrhage; PRP panretinal laser photocoagulation; IVK intravitreal kenalog; VMT vitreomacular traction; MH Macular hole;  NVD neovascularization of the disc; NVE neovascularization elsewhere; AREDS age related eye disease study; ARMD age related macular degeneration; POAG primary open angle glaucoma; EBMD epithelial/anterior basement membrane dystrophy; ACIOL anterior chamber intraocular lens; IOL intraocular lens; PCIOL posterior chamber intraocular lens; Phaco/IOL phacoemulsification with intraocular lens placement; [place] photorefractive keratectomy; LASIK laser assisted in situ keratomileusis; HTN hypertension; DM diabetes mellitus; COPD chronic obstructive pulmonary disease

## 2020-06-17 NOTE — Assessment & Plan Note (Signed)
D, improved at 6-week interval.  We will repeat intravitreal Avastin today and examination in 7 weeks

## 2020-06-17 NOTE — Assessment & Plan Note (Signed)
OS improved 1 week status post intravitreal Avastin

## 2020-07-03 DIAGNOSIS — Z96652 Presence of left artificial knee joint: Secondary | ICD-10-CM | POA: Diagnosis not present

## 2020-07-03 DIAGNOSIS — J449 Chronic obstructive pulmonary disease, unspecified: Secondary | ICD-10-CM | POA: Diagnosis not present

## 2020-07-03 DIAGNOSIS — J9601 Acute respiratory failure with hypoxia: Secondary | ICD-10-CM | POA: Diagnosis not present

## 2020-07-22 ENCOUNTER — Encounter (INDEPENDENT_AMBULATORY_CARE_PROVIDER_SITE_OTHER): Payer: Self-pay | Admitting: Ophthalmology

## 2020-07-22 ENCOUNTER — Ambulatory Visit (INDEPENDENT_AMBULATORY_CARE_PROVIDER_SITE_OTHER): Payer: Medicare Other | Admitting: Ophthalmology

## 2020-07-22 ENCOUNTER — Other Ambulatory Visit: Payer: Self-pay

## 2020-07-22 DIAGNOSIS — H353211 Exudative age-related macular degeneration, right eye, with active choroidal neovascularization: Secondary | ICD-10-CM | POA: Diagnosis not present

## 2020-07-22 DIAGNOSIS — H353221 Exudative age-related macular degeneration, left eye, with active choroidal neovascularization: Secondary | ICD-10-CM

## 2020-07-22 MED ORDER — BEVACIZUMAB CHEMO INJECTION 1.25MG/0.05ML SYRINGE FOR KALEIDOSCOPE
1.2500 mg | INTRAVITREAL | Status: AC | PRN
  Administered 2020-07-22: 1.25 mg via INTRAVITREAL

## 2020-07-22 NOTE — Patient Instructions (Signed)
Notify the office promptly if new onset visual acuity decline or distortion in either eye

## 2020-07-22 NOTE — Assessment & Plan Note (Signed)
Much less active CNVM left eye with less subretinal fluid 6 weeks status post Avastin, will repeat injection today examination again in 6 weeks left eye

## 2020-07-22 NOTE — Progress Notes (Signed)
07/22/2020     CHIEF COMPLAINT Patient presents for Retina Follow Up   HISTORY OF PRESENT ILLNESS: [name] is a 79 y.o. female who presents to the clinic today for:   HPI    Retina Follow Up    Patient presents with  Wet AMD.  In left eye.  This started 6 weeks ago.  Severity is mild.  Duration of 6 weeks.  Since onset it is stable.          Comments    6 Week AMD F/U OU, poss Avastin OS  Pt reports intermittent blur with TV OU. Pt c/o intermittent burning sensation and dryness OU.       Last edited by Rockie Neighbours, [place] on 07/22/2020  2:23 PM. (History)      Referring physician: [name], MD [address] [place],  [place] [address]  HISTORICAL INFORMATION:   Selected notes from the [facility]: No current outpatient medications on file. (Ophthalmic Drugs)   No current facility-administered medications for this visit. (Ophthalmic Drugs)   Current Outpatient Medications (Other)  Medication Sig   albuterol (PROVENTIL HFA;VENTOLIN HFA) 108 (90 Base) MCG/ACT inhaler Inhale 2 puffs into the lungs every 6 (six) hours as needed for wheezing or shortness of breath. Reported on 02/07/2016   aspirin EC 81 MG tablet Take 81 mg by mouth at bedtime.    atenolol (TENORMIN) 50 MG tablet Take 25 mg by mouth 2 (two) times daily.    cloNIDine (CATAPRES) 0.3 MG tablet Take 0.15 mg by mouth 2 (two) times daily.    diltiazem (CARDIZEM CD) 180 MG 24 hr capsule Take 180 mg by mouth daily.    esomeprazole (NEXIUM) 40 MG capsule Take 1 capsule (40 mg total) by mouth every morning.   furosemide (LASIX) 20 MG tablet Take 2 tablets (40 mg total) by mouth daily.   furosemide (LASIX) 20 MG tablet    hydroxyurea (HYDREA) 500 MG capsule Take 1 capsule (500 mg total) by mouth daily. May take with food to minimize GI side effects.   letrozole (FEMARA) 2.5 MG tablet Take 1 tablet by mouth once daily   losartan (COZAAR) 100 MG tablet  Take 100 mg by mouth every evening.    Simethicone (GAS RELIEF PO) Take 1 tablet by mouth daily.    simvastatin (ZOCOR) 10 MG tablet Take 10 mg by mouth every evening.    No current facility-administered medications for this visit. (Other)      REVIEW OF SYSTEMS:    ALLERGIES Allergies  Allergen Reactions   Carbamazepine Hives and Other (See Comments)    headache   Sulfa Antibiotics     Rash    PAST MEDICAL HISTORY Past Medical History:  Diagnosis Date   Anemia 04/02/2015   Arthritis    Atypical glandular cells on vaginal Papanicolaou smear 04/12/2016   Well get colpo and biopsy with JVF   Bronchitis, chronic obstructive ([place]) since 2000's   Cancer ([place])    dx with endometrial cancer    Dysrhythmia    GERD (gastroesophageal reflux disease)    Gout    Heart murmur    Hiatal hernia    Hiatal hernia 11/19/2012   History of endometrial cancer 04/06/2016   Sp hysterectomy 2012   Hypertension    Ischemic colitis ([place])    03/2015   Myeloproliferative disorder, JAK-2 positive 01/05/2014   Obesity    Obesity, morbid (more than  100 lbs over ideal weight or BMI > 40) (HCC) 08/2011   Ht 5'7", wt 280 lb   Pneumonia 09/2014   Polycythemia vera(238.4) 01/18/2014   PONV (postoperative nausea and vomiting)    post-op hypoxic resp failure 09/2011 and require Bi-Pap (possible r/t - pressure pulm edema vs asp PNA, COPD exacerbation, obesity hypoventilation   Pulmonary hypertension (HCC)    Shortness of breath    unable to lay flat due to dyspnea   Vaginal Pap smear, abnormal    Past Surgical History:  Procedure Laterality Date   ABDOMINAL HYSTERECTOMY  09/12/2011   Procedure: HYSTERECTOMY ABDOMINAL;  Surgeon: Janie Morning, MD;  [facility];  Service: Gynecology;  Laterality: N/A;  Total Abdominal Hysterectomy, Bilateral Salpingo Oophorectomy   BREAST LUMPECTOMY Left 07/10/2017   LEFT BREAST LUMPECTOMY WITH RADIOACTIVE SEED AND SENTINEL LYMPH NODE  BIOPSY    BREAST LUMPECTOMY WITH RADIOACTIVE SEED AND SENTINEL LYMPH NODE BIOPSY Left 07/10/2017   Procedure: LEFT BREAST LUMPECTOMY WITH RADIOACTIVE SEED AND SENTINEL LYMPH NODE BIOPSY;  Surgeon: Rolm Bookbinder, MD;  Location: [facility];  Service: General;  Laterality: Left;   COLONOSCOPY  07/05/2012   Procedure: COLONOSCOPY;  Surgeon: Rogene Houston, MD;  [facility];  Service: Endoscopy;  Laterality: N/A;  730   EYE SURGERY     bilateral 8 - 12 yrs ago   FLEXIBLE SIGMOIDOSCOPY N/A 04/03/2015   Procedure: FLEXIBLE SIGMOIDOSCOPY;  Surgeon: Rogene Houston, MD;  [facility];  Service: Endoscopy;  Laterality: N/A;   HYSTEROSCOPY WITH D & C  08/15/2011   Procedure: DILATATION AND CURETTAGE (D&C) /HYSTEROSCOPY;  Surgeon: Jonnie Kind, MD;  Location[facility];  Service: Gynecology;  Laterality: N/A;  With Suction Curette   JOINT REPLACEMENT     left knee replaced 2014   KNEE ARTHROPLASTY Left 03/15/2015   Procedure: COMPUTER ASSISTED TOTAL KNEE ARTHROPLASTY;  Surgeon: Marybelle Killings, MD;  Location: [facility];  Service: Orthopedics;  Laterality: Left;   SALPINGOOPHORECTOMY  09/12/2011   Procedure: SALPINGO OOPHERECTOMY;  Surgeon: Janie Morning, MD;  [facility];  Service: Gynecology;  Laterality: Bilateral;    FAMILY HISTORY Family History  Problem Relation Age of Onset   Other Mother    Lung cancer Father    Hypertension Son    Anesthesia problems Neg Hx    Hypotension Neg Hx    Malignant hyperthermia Neg Hx    Pseudochol deficiency Neg Hx     SOCIAL HISTORY Social History   Tobacco Use   Smoking status: Never Smoker   Smokeless tobacco: Never Used  Vaping Use   Vaping Use: Never used  Substance Use Topics   Alcohol use: No   Drug use: No         OPHTHALMIC EXAM:  Base Eye Exam    Visual Acuity (ETDRS)      Right Left   Dist [place] 20/20 -1 20/30 +2   Dist ph [place]  NI       Tonometry (Tonopen, 2:24 PM)      Right Left   Pressure 08  08       Pupils      Pupils Dark Light Shape React APD   Right PERRL 3 2 Round Slow None   Left PERRL 3 2 Round Slow None       Visual Fields (Counting fingers)      Left Right    Full Full       Extraocular Movement  Right Left    Full Full       Neuro/Psych    Oriented x3: Yes   Mood/Affect: Normal       Dilation    Both eyes: 1.0% Mydriacyl, 2.5% Phenylephrine @ 2:27 PM        Slit Lamp and Fundus Exam    External Exam      Right Left   External Normal Normal       Slit Lamp Exam      Right Left   Lids/Lashes Normal Normal   Conjunctiva/Sclera White and quiet White and quiet   Cornea Clear Clear   Anterior Chamber Deep and quiet Deep and quiet   Iris Round and reactive Round and reactive   Lens Posterior chamber intraocular lens Posterior chamber intraocular lens   Anterior Vitreous Normal Normal       Fundus Exam      Right Left   Posterior Vitreous Posterior vitreous detachment Posterior vitreous detachment   Disc Normal Normal   C/D Ratio 0.2 0.25   Macula Intermediate age related macular degeneration, Pigmented atrophy, no macular thickening, no hemorrhage, no exudates no macular thickening, Subretinal neovascular membrane, Retinal pigment epithelial mottling, Hard drusen, no exudates   Vessels Normal Normal   Periphery Normal Normal          IMAGING AND PROCEDURES  Imaging and Procedures for 07/22/20  OCT, Retina - OU - Both Eyes       Right Eye Quality was good. Scan locations included subfoveal. Central Foveal Thickness: 245. Progression has been stable. Findings include retinal drusen , no IRF, no SRF.   Left Eye Quality was good. Scan locations included subfoveal. Central Foveal Thickness: 238. Progression has improved. Findings include subretinal fluid, no IRF, choroidal neovascular membrane.   Notes Vastly improved on intravitreal Avastin will repeat injection today       Intravitreal Injection, Pharmacologic Agent - OS -  Left Eye       Time Out 07/22/2020. 3:33 PM. Confirmed correct patient, procedure, site, and patient consented.   Anesthesia Topical anesthesia was used. Anesthetic medications included [place] 3.5%.   Procedure Preparation included Ofloxacin , 10% betadine to eyelids, 5% betadine to ocular surface. A 30 gauge needle was used.   Injection:  1.25 mg Bevacizumab (AVASTIN) SOLN   NDC: [address]-6, Lot: [id number]   Route: Intravitreal, Site: Left Eye, Waste: 0 mg  Post-op Post injection exam found visual acuity of at least counting fingers. The patient tolerated the procedure well. There were no complications. The patient received written and verbal post procedure care education. Post injection medications were not given.                 ASSESSMENT/PLAN:  Exudative age-related macular degeneration of left eye with active choroidal neovascularization (HCC) Much less active CNVM left eye with less subretinal fluid 6 weeks status post Avastin, will repeat injection today examination again in 6 weeks left eye  Exudative age-related macular degeneration of right eye with active choroidal neovascularization ([place]) Follow-up right eye as scheduled      ICD-10-CM   1. Exudative age-related macular degeneration of left eye with active choroidal neovascularization (HCC)  H35.3221 OCT, Retina - OU - Both Eyes    Intravitreal Injection, Pharmacologic Agent - OS - Left Eye    Bevacizumab (AVASTIN) SOLN 1.25 mg  2. Exudative age-related macular degeneration of right eye with active choroidal neovascularization (HCC)  H35.3211 OCT, Retina - OU - Both Eyes  1.  OS with less subretinal fluid, post injection Avastin, 6-week interval.  Repeat injection today valuation again in 6 weeks  2.  OD, follow-up as scheduled  3.  Ophthalmic Meds Ordered this visit:  Meds ordered this encounter  Medications   Bevacizumab (AVASTIN) SOLN 1.25 mg       Return in about 6 weeks (around 09/02/2020)  for dilate, OS, AVASTIN OCT.  Patient Instructions  Notify the office promptly if new onset visual acuity decline or distortion in either eye    Explained the diagnoses, plan, and follow up with the patient and they expressed understanding.  Patient expressed understanding of the importance of proper follow up care.   Clent Demark Florenda Watt M.D. Diseases & Surgery of the Retina and Vitreous [facility]ina [facility] Diabetic [facility] 07/22/20     Abbreviations: M myopia (nearsighted); A astigmatism; H hyperopia (farsighted); P presbyopia; Mrx spectacle prescription;  CTL contact lenses; OD right eye; OS left eye; OU both eyes  XT exotropia; ET esotropia; PEK punctate epithelial keratitis; PEE punctate epithelial erosions; DES dry eye syndrome; MGD meibomian gland dysfunction; ATs artificial tears; PFAT's preservative free artificial tears; [place] nuclear sclerotic cataract; PSC posterior subcapsular cataract; ERM epi-retinal membrane; PVD posterior vitreous detachment; RD retinal detachment; DM diabetes mellitus; DR diabetic retinopathy; NPDR non-proliferative diabetic retinopathy; PDR proliferative diabetic retinopathy; CSME clinically significant macular edema; DME diabetic macular edema; dbh dot blot hemorrhages; CWS cotton wool spot; POAG primary open angle glaucoma; C/D cup-to-disc ratio; HVF humphrey visual field; GVF goldmann visual field; OCT optical coherence tomography; IOP intraocular pressure; BRVO Branch retinal vein occlusion; CRVO central retinal vein occlusion; CRAO central retinal artery occlusion; BRAO branch retinal artery occlusion; RT retinal tear; SB scleral buckle; PPV pars plana vitrectomy; VH Vitreous hemorrhage; PRP panretinal laser photocoagulation; IVK intravitreal kenalog; VMT vitreomacular traction; MH Macular hole;  NVD neovascularization of the disc; NVE neovascularization elsewhere; AREDS age related eye disease study; ARMD age related macular degeneration; POAG primary open angle glaucoma;  EBMD epithelial/anterior basement membrane dystrophy; ACIOL anterior chamber intraocular lens; IOL intraocular lens; PCIOL posterior chamber intraocular lens; Phaco/IOL phacoemulsification with intraocular lens placement; [place] photorefractive keratectomy; LASIK laser assisted in situ keratomileusis; HTN hypertension; DM diabetes mellitus; COPD chronic obstructive pulmonary disease

## 2020-07-22 NOTE — Assessment & Plan Note (Signed)
Follow-up right eye as scheduled 

## 2020-08-03 DIAGNOSIS — J449 Chronic obstructive pulmonary disease, unspecified: Secondary | ICD-10-CM | POA: Diagnosis not present

## 2020-08-03 DIAGNOSIS — Z96652 Presence of left artificial knee joint: Secondary | ICD-10-CM | POA: Diagnosis not present

## 2020-08-03 DIAGNOSIS — J9601 Acute respiratory failure with hypoxia: Secondary | ICD-10-CM | POA: Diagnosis not present

## 2020-08-05 ENCOUNTER — Other Ambulatory Visit: Payer: Self-pay

## 2020-08-05 ENCOUNTER — Ambulatory Visit (INDEPENDENT_AMBULATORY_CARE_PROVIDER_SITE_OTHER): Payer: Medicare Other | Admitting: Ophthalmology

## 2020-08-05 ENCOUNTER — Encounter (INDEPENDENT_AMBULATORY_CARE_PROVIDER_SITE_OTHER): Payer: Self-pay | Admitting: Ophthalmology

## 2020-08-05 DIAGNOSIS — H353211 Exudative age-related macular degeneration, right eye, with active choroidal neovascularization: Secondary | ICD-10-CM

## 2020-08-05 MED ORDER — BEVACIZUMAB CHEMO INJECTION 1.25MG/0.05ML SYRINGE FOR KALEIDOSCOPE
1.2500 mg | INTRAVITREAL | Status: AC | PRN
  Administered 2020-08-05: 1.25 mg via INTRAVITREAL

## 2020-08-05 NOTE — Progress Notes (Signed)
08/05/2020     CHIEF COMPLAINT Patient presents for Retina Follow Up   HISTORY OF PRESENT ILLNESS: [name] is a 79 y.o. female who presents to the clinic today for:   HPI    Retina Follow Up    Patient presents with  Wet AMD.  In right eye.  Severity is moderate.  Duration of 7 weeks.  Since onset it is stable.  I, the attending physician,  performed the HPI with the patient and updated documentation appropriately.          Comments    7 Week Wet AMD f\u OD. Possible Avastin OD. OCT  Pt states vision is stable. Denies any complaints.       Last edited by [name] on 08/05/2020  1:22 PM. (History)      Referring physician: [name], MD [address] [place],  [place] [address]  HISTORICAL INFORMATION:   Selected notes from the MEDICAL RECORD NUMBER       CURRENT MEDICATIONS: No current outpatient medications on file. (Ophthalmic Drugs)   No current facility-administered medications for this visit. (Ophthalmic Drugs)   Current Outpatient Medications (Other)  Medication Sig  . albuterol (PROVENTIL HFA;VENTOLIN HFA) 108 (90 Base) MCG/ACT inhaler Inhale 2 puffs into the lungs every 6 (six) hours as needed for wheezing or shortness of breath. Reported on 02/07/2016  . aspirin EC 81 MG tablet Take 81 mg by mouth at bedtime.   Marland Kitchen atenolol (TENORMIN) 50 MG tablet Take 25 mg by mouth 2 (two) times daily.   . cloNIDine (CATAPRES) 0.3 MG tablet Take 0.15 mg by mouth 2 (two) times daily.   Marland Kitchen diltiazem (CARDIZEM CD) 180 MG 24 hr capsule Take 180 mg by mouth daily.   Marland Kitchen esomeprazole (NEXIUM) 40 MG capsule Take 1 capsule (40 mg total) by mouth every morning.  . furosemide (LASIX) 20 MG tablet Take 2 tablets (40 mg total) by mouth daily.  . furosemide (LASIX) 20 MG tablet   . hydroxyurea (HYDREA) 500 MG capsule Take 1 capsule (500 mg total) by mouth daily. May take with food to minimize GI side effects.  Marland Kitchen letrozole (FEMARA) 2.5 MG tablet Take 1 tablet by  mouth once daily  . losartan (COZAAR) 100 MG tablet Take 100 mg by mouth every evening.   . Simethicone (GAS RELIEF PO) Take 1 tablet by mouth daily.   . simvastatin (ZOCOR) 10 MG tablet Take 10 mg by mouth every evening.    No current facility-administered medications for this visit. (Other)      REVIEW OF SYSTEMS:    ALLERGIES Allergies  Allergen Reactions  . Carbamazepine Hives and Other (See Comments)    headache  . Sulfa Antibiotics     Rash    PAST MEDICAL HISTORY Past Medical History:  Diagnosis Date  . Anemia 04/02/2015  . Arthritis   . Atypical glandular cells on vaginal Papanicolaou smear 04/12/2016   Well get colpo and biopsy with JVF  . Bronchitis, chronic obstructive ([place]) since 2000's  . Cancer [facility])    dx with endometrial cancer   . Dysrhythmia   . GERD (gastroesophageal reflux disease)   . Gout   . Heart murmur   . Hiatal hernia   . Hiatal hernia 11/19/2012  . History of endometrial cancer 04/06/2016   Sp hysterectomy 2012  . Hypertension   . Ischemic colitis ([place])    03/2015  . Myeloproliferative disorder, JAK-2 positive 01/05/2014  . Obesity   .  Obesity, morbid (more than 100 lbs over ideal weight or BMI > 40) (HCC) 08/2011   Ht 5'7", wt 280 lb  . Pneumonia 09/2014  . Polycythemia vera(238.4) 01/18/2014  . PONV (postoperative nausea and vomiting)    post-op hypoxic resp failure 09/2011 and require Bi-Pap (possible r/t - pressure pulm edema vs asp PNA, COPD exacerbation, obesity hypoventilation  . Pulmonary hypertension ([place])   . Shortness of breath    unable to lay flat due to dyspnea  . Vaginal Pap smear, abnormal    Past Surgical History:  Procedure Laterality Date  . ABDOMINAL HYSTERECTOMY  09/12/2011   Procedure: HYSTERECTOMY ABDOMINAL;  Surgeon: Janie Morning, MD;  [facility];  Service: Gynecology;  Laterality: N/A;  Total Abdominal Hysterectomy, Bilateral Salpingo Oophorectomy  . BREAST LUMPECTOMY Left 07/10/2017   LEFT BREAST  LUMPECTOMY WITH RADIOACTIVE SEED AND SENTINEL LYMPH NODE BIOPSY   . BREAST LUMPECTOMY WITH RADIOACTIVE SEED AND SENTINEL LYMPH NODE BIOPSY Left 07/10/2017   Procedure: LEFT BREAST LUMPECTOMY WITH RADIOACTIVE SEED AND SENTINEL LYMPH NODE BIOPSY;  Surgeon: Rolm Bookbinder, MD;  Location: [facility];  Service: General;  Laterality: Left;  . COLONOSCOPY  07/05/2012   Procedure: COLONOSCOPY;  Surgeon: Rogene Houston, MD;  [facility];  Service: Endoscopy;  Laterality: N/A;  730  . EYE SURGERY     bilateral 8 - 12 yrs ago  . FLEXIBLE SIGMOIDOSCOPY N/A 04/03/2015   Procedure: FLEXIBLE SIGMOIDOSCOPY;  Surgeon: Rogene Houston, MD;  [facility];  Service: Endoscopy;  Laterality: N/A;  . HYSTEROSCOPY WITH D & C  08/15/2011   Procedure: DILATATION AND CURETTAGE (D&C) /HYSTEROSCOPY;  Surgeon: Jonnie Kind, MD;  [facility];  Service: Gynecology;  Laterality: N/A;  With Suction Curette  . JOINT REPLACEMENT     left knee replaced 2014  . KNEE ARTHROPLASTY Left 03/15/2015   Procedure: COMPUTER ASSISTED TOTAL KNEE ARTHROPLASTY;  Surgeon: Marybelle Killings, MD;  Location: [facility];  Service: Orthopedics;  Laterality: Left;  . SALPINGOOPHORECTOMY  09/12/2011   Procedure: SALPINGO OOPHERECTOMY;  Surgeon: Janie Morning, MD;  [facility];  Service: Gynecology;  Laterality: Bilateral;    FAMILY HISTORY Family History  Problem Relation Age of Onset  . Other Mother   . Lung cancer Father   . Hypertension Son   . Anesthesia problems Neg Hx   . Hypotension Neg Hx   . Malignant hyperthermia Neg Hx   . Pseudochol deficiency Neg Hx     SOCIAL HISTORY Social History   Tobacco Use  . Smoking status: Never Smoker  . Smokeless tobacco: Never Used  Vaping Use  . Vaping Use: Never used  Substance Use Topics  . Alcohol use: No  . Drug use: No         OPHTHALMIC EXAM: Base Eye Exam    Visual Acuity (Snellen - Linear)      Right Left   Dist [place] 20/25 + 20/40   Dist ph [place]  NI         Tonometry (Tonopen, 1:25 PM)      Right Left   Pressure 10 10       Pupils      Pupils Dark Light Shape React APD   Right PERRL 3 2 Round Slow None   Left PERRL 3 2 Round Slow None       Visual Fields (Counting fingers)      Left Right    Full Full  Neuro/Psych    Oriented x3: Yes   Mood/Affect: Normal       Dilation    Right eye: 1.0% Mydriacyl, 2.5% Phenylephrine @ 1:25 PM        Slit Lamp and Fundus Exam    External Exam      Right Left   External Normal Normal       Slit Lamp Exam      Right Left   Lids/Lashes Normal Normal   Conjunctiva/Sclera White and quiet White and quiet   Cornea Clear Clear   Anterior Chamber Deep and quiet Deep and quiet   Iris Round and reactive Round and reactive   Lens Posterior chamber intraocular lens Posterior chamber intraocular lens   Anterior Vitreous Normal Normal       Fundus Exam      Right Left   Posterior Vitreous Normal    Disc Normal.  Peripapillary hemorrhage superior to the optic nerve fading, less thickness to this area    C/D Ratio 0.15    Macula Hard drusen, no hemorrhage, no macular thickening    Vessels Normal    Periphery Subretinal hemorrhage, along the superotemporal arcade           IMAGING AND PROCEDURES  Imaging and Procedures for 08/05/20  OCT, Retina - OU - Both Eyes       Right Eye Quality was good. Scan locations included subfoveal. Central Foveal Thickness: 244. Progression has improved. Findings include abnormal foveal contour, no SRF.   Left Eye Quality was good. Scan locations included subfoveal. Central Foveal Thickness: 233. Progression has improved. Findings include abnormal foveal contour.   Notes OD with improved subretinal fluid, contour superior to the nerve.  OS, juxta foveal CNVM and fluid, improved 2 weeks post Avastin                ASSESSMENT/PLAN:  No problem-specific Assessment & Plan notes found for this encounter.      ICD-10-CM   1.  Exudative age-related macular degeneration of right eye with active choroidal neovascularization (HCC)  H35.3211 OCT, Retina - OU - Both Eyes    1.We will repeat injection today OD, and repeat examination right eye in 8 weeks with color fundus photography, based upon today's examination and improved findings  2.  Repeat examination left eye as scheduled  3.  Ophthalmic Meds Ordered this visit:  No orders of the defined types were placed in this encounter.      Return in about 8 weeks (around 09/30/2020) for dilate, COLOR FP, AVASTIN OCT, OD.  There are no Patient Instructions on file for this visit.   Explained the diagnoses, plan, and follow up with the patient and they expressed understanding.  Patient expressed understanding of the importance of proper follow up care.   Clent Demark Johnisha Louks M.D. Diseases & Surgery of the Retina and Vitreous Retina & Diabetic [place] 08/05/20     Abbreviations: M myopia (nearsighted); A astigmatism; H hyperopia (farsighted); P presbyopia; Mrx spectacle prescription;  CTL contact lenses; OD right eye; OS left eye; OU both eyes  XT exotropia; ET esotropia; PEK punctate epithelial keratitis; PEE punctate epithelial erosions; DES dry eye syndrome; MGD meibomian gland dysfunction; ATs artificial tears; PFAT's preservative free artificial tears; [place] nuclear sclerotic cataract; PSC posterior subcapsular cataract; ERM epi-retinal membrane; PVD posterior vitreous detachment; RD retinal detachment; DM diabetes mellitus; DR diabetic retinopathy; NPDR non-proliferative diabetic retinopathy; PDR proliferative diabetic retinopathy; CSME clinically significant macular edema; DME diabetic macular edema; dbh  dot blot hemorrhages; CWS cotton wool spot; POAG primary open angle glaucoma; C/D cup-to-disc ratio; HVF humphrey visual field; GVF goldmann visual field; OCT optical coherence tomography; IOP intraocular pressure; BRVO Branch retinal vein occlusion; CRVO central  retinal vein occlusion; CRAO central retinal artery occlusion; BRAO branch retinal artery occlusion; RT retinal tear; SB scleral buckle; PPV pars plana vitrectomy; VH Vitreous hemorrhage; PRP panretinal laser photocoagulation; IVK intravitreal kenalog; VMT vitreomacular traction; MH Macular hole;  NVD neovascularization of the disc; NVE neovascularization elsewhere; AREDS age related eye disease study; ARMD age related macular degeneration; POAG primary open angle glaucoma; EBMD epithelial/anterior basement membrane dystrophy; ACIOL anterior chamber intraocular lens; IOL intraocular lens; PCIOL posterior chamber intraocular lens; Phaco/IOL phacoemulsification with intraocular lens placement; [place] photorefractive keratectomy; LASIK laser assisted in situ keratomileusis; HTN hypertension; DM diabetes mellitus; COPD chronic obstructive pulmonary disease

## 2020-08-05 NOTE — Assessment & Plan Note (Signed)
Large pericapillary CNVM with subretinal fluid superior to the nerve and towards the papillomacular bundle, now much better controlled and thinning of subretinal fluid on ongoing therapy, currently at 7-week interval.  We will repeat injection today and repeat examination right eye in 8 weeks with color fundus photography

## 2020-09-02 ENCOUNTER — Other Ambulatory Visit: Payer: Self-pay

## 2020-09-02 ENCOUNTER — Ambulatory Visit (INDEPENDENT_AMBULATORY_CARE_PROVIDER_SITE_OTHER): Payer: Medicare Other | Admitting: Ophthalmology

## 2020-09-02 ENCOUNTER — Encounter (INDEPENDENT_AMBULATORY_CARE_PROVIDER_SITE_OTHER): Payer: Self-pay | Admitting: Ophthalmology

## 2020-09-02 DIAGNOSIS — H353211 Exudative age-related macular degeneration, right eye, with active choroidal neovascularization: Secondary | ICD-10-CM | POA: Diagnosis not present

## 2020-09-02 DIAGNOSIS — H353221 Exudative age-related macular degeneration, left eye, with active choroidal neovascularization: Secondary | ICD-10-CM | POA: Diagnosis not present

## 2020-09-02 DIAGNOSIS — J9601 Acute respiratory failure with hypoxia: Secondary | ICD-10-CM | POA: Diagnosis not present

## 2020-09-02 DIAGNOSIS — Z96652 Presence of left artificial knee joint: Secondary | ICD-10-CM | POA: Diagnosis not present

## 2020-09-02 DIAGNOSIS — J449 Chronic obstructive pulmonary disease, unspecified: Secondary | ICD-10-CM | POA: Diagnosis not present

## 2020-09-02 MED ORDER — BEVACIZUMAB CHEMO INJECTION 1.25MG/0.05ML SYRINGE FOR KALEIDOSCOPE
1.2500 mg | INTRAVITREAL | Status: AC | PRN
  Administered 2020-09-02: 1.25 mg via INTRAVITREAL

## 2020-09-02 NOTE — Progress Notes (Addendum)
09/02/2020     CHIEF COMPLAINT Patient presents for Retina Follow Up   HISTORY OF PRESENT ILLNESS: [name] is a 79 y.o. female who presents to the clinic today for:   HPI    Retina Follow Up    Patient presents with  Wet AMD.  In left eye.  Severity is moderate.  Duration of 6 weeks.  Since onset it is stable.  I, the attending physician,  performed the HPI with the patient and updated documentation appropriately.          Comments    6 Week Wet AMD f\u OS. Possible Avastin OS. OCT  Pt states she is not able to see things as far away as before. Pt states DVA is now blurry.       Last edited by [name] on 09/02/2020  2:48 PM. (History)      Referring physician: [name], MD [address] [place],  [place] [address]  HISTORICAL INFORMATION:   Selected notes from the [facility]: No current outpatient medications on file. (Ophthalmic Drugs)   No current facility-administered medications for this visit. (Ophthalmic Drugs)   Current Outpatient Medications (Other)  Medication Sig   albuterol (PROVENTIL HFA;VENTOLIN HFA) 108 (90 Base) MCG/ACT inhaler Inhale 2 puffs into the lungs every 6 (six) hours as needed for wheezing or shortness of breath. Reported on 02/07/2016   aspirin EC 81 MG tablet Take 81 mg by mouth at bedtime.    atenolol (TENORMIN) 50 MG tablet Take 25 mg by mouth 2 (two) times daily.    cloNIDine (CATAPRES) 0.3 MG tablet Take 0.15 mg by mouth 2 (two) times daily.    diltiazem (CARDIZEM CD) 180 MG 24 hr capsule Take 180 mg by mouth daily.    esomeprazole (NEXIUM) 40 MG capsule Take 1 capsule (40 mg total) by mouth every morning.   furosemide (LASIX) 20 MG tablet Take 2 tablets (40 mg total) by mouth daily.   furosemide (LASIX) 20 MG tablet    hydroxyurea (HYDREA) 500 MG capsule Take 1 capsule (500 mg total) by mouth daily. May take with food to minimize GI side effects.   letrozole  (FEMARA) 2.5 MG tablet Take 1 tablet by mouth once daily   losartan (COZAAR) 100 MG tablet Take 100 mg by mouth every evening.    Simethicone (GAS RELIEF PO) Take 1 tablet by mouth daily.    simvastatin (ZOCOR) 10 MG tablet Take 10 mg by mouth every evening.    No current facility-administered medications for this visit. (Other)      REVIEW OF SYSTEMS:    ALLERGIES Allergies  Allergen Reactions   Carbamazepine Hives and Other (See Comments)    headache   Sulfa Antibiotics     Rash    PAST MEDICAL HISTORY Past Medical History:  Diagnosis Date   Anemia 04/02/2015   Arthritis    Atypical glandular cells on vaginal Papanicolaou smear 04/12/2016   Well get colpo and biopsy with JVF   Bronchitis, chronic obstructive ([place]) since 2000's   Cancer ([place])    dx with endometrial cancer    Dysrhythmia    GERD (gastroesophageal reflux disease)    Gout    Heart murmur    Hiatal hernia    Hiatal hernia 11/19/2012   History of endometrial cancer 04/06/2016   Sp hysterectomy 2012   Hypertension    Ischemic colitis ([place])    03/2015  Myeloproliferative disorder, JAK-2 positive 01/05/2014   Obesity    Obesity, morbid (more than 100 lbs over ideal weight or BMI > 40) ([place]) 08/2011   Ht 5'7", wt 280 lb   Pneumonia 09/2014   Polycythemia vera(238.4) 01/18/2014   PONV (postoperative nausea and vomiting)    post-op hypoxic resp failure 09/2011 and require Bi-Pap (possible r/t - pressure pulm edema vs asp PNA, COPD exacerbation, obesity hypoventilation   Pulmonary hypertension (HCC)    Shortness of breath    unable to lay flat due to dyspnea   Vaginal Pap smear, abnormal    Past Surgical History:  Procedure Laterality Date   ABDOMINAL HYSTERECTOMY  09/12/2011   Procedure: HYSTERECTOMY ABDOMINAL;  Surgeon: Janie Morning, MD;  Location: [facility];  Service: Gynecology;  Laterality: N/A;  Total Abdominal Hysterectomy, Bilateral Salpingo Oophorectomy   BREAST LUMPECTOMY  Left 07/10/2017   LEFT BREAST LUMPECTOMY WITH RADIOACTIVE SEED AND SENTINEL LYMPH NODE BIOPSY    BREAST LUMPECTOMY WITH RADIOACTIVE SEED AND SENTINEL LYMPH NODE BIOPSY Left 07/10/2017   Procedure: LEFT BREAST LUMPECTOMY WITH RADIOACTIVE SEED AND SENTINEL LYMPH NODE BIOPSY;  Surgeon: Rolm Bookbinder, MD;  Location: [facility];  Service: General;  Laterality: Left;   COLONOSCOPY  07/05/2012   Procedure: COLONOSCOPY;  Surgeon: Rogene Houston, MD;  Location: [facility];  Service: Endoscopy;  Laterality: N/A;  730   EYE SURGERY     bilateral 8 - 12 yrs ago   FLEXIBLE SIGMOIDOSCOPY N/A 04/03/2015   Procedure: FLEXIBLE SIGMOIDOSCOPY;  Surgeon: Rogene Houston, MD;  [facility]: [facility];  Service: Endoscopy;  Laterality: N/A;   HYSTEROSCOPY WITH D & C  08/15/2011   Procedure: DILATATION AND CURETTAGE (D&C) /HYSTEROSCOPY;  Surgeon: Jonnie Kind, MD;  Location: [facility];  Service: Gynecology;  Laterality: N/A;  With Suction Curette   JOINT REPLACEMENT     left knee replaced 2014   KNEE ARTHROPLASTY Left 03/15/2015   Procedure: COMPUTER ASSISTED TOTAL KNEE ARTHROPLASTY;  Surgeon: Marybelle Killings, MD;  Location: [facility];  Service: Orthopedics;  Laterality: Left;   SALPINGOOPHORECTOMY  09/12/2011   Procedure: SALPINGO OOPHERECTOMY;  Surgeon: Janie Morning, MD;  Location: [facility];  Service: Gynecology;  Laterality: Bilateral;    FAMILY HISTORY Family History  Problem Relation Age of Onset   Other Mother    Lung cancer Father    Hypertension Son    Anesthesia problems Neg Hx    Hypotension Neg Hx    Malignant hyperthermia Neg Hx    Pseudochol deficiency Neg Hx     SOCIAL HISTORY Social History   Tobacco Use   Smoking status: Never Smoker   Smokeless tobacco: Never Used  Vaping Use   Vaping Use: Never used  Substance Use Topics   Alcohol use: No   Drug use: No         OPHTHALMIC EXAM:  Base Eye Exam    Visual Acuity (Snellen - Linear)      Right Left   Dist [place] 20/25  -2 20/25 -1       Tonometry (Tonopen, 2:52 PM)      Right Left   Pressure 12 10       Pupils      Pupils Dark Light Shape React APD   Right PERRL 3 2 Round Slow None   Left PERRL 3 2 Round Slow None       Visual Fields (Counting fingers)      Left Right    Full  Full       Neuro/Psych    Oriented x3: Yes   Mood/Affect: Normal       Dilation    Left eye: 1.0% Mydriacyl, 2.5% Phenylephrine @ 2:52 PM        Slit Lamp and Fundus Exam    External Exam      Right Left   External Normal Normal       Slit Lamp Exam      Right Left   Lids/Lashes Normal Normal   Conjunctiva/Sclera White and quiet White and quiet   Cornea Clear Clear   Anterior Chamber Deep and quiet Deep and quiet   Iris Round and reactive Round and reactive   Lens Posterior chamber intraocular lens Posterior chamber intraocular lens   Anterior Vitreous Normal Normal       Fundus Exam      Right Left   Posterior Vitreous  Posterior vitreous detachment   Disc  Normal   C/D Ratio  0.25   Macula  no macular thickening, Subretinal neovascular membrane, Retinal pigment epithelial mottling, Hard drusen, no exudates   Vessels  Normal   Periphery  Normal          IMAGING AND PROCEDURES  Imaging and Procedures for 09/02/20  OCT, Retina - OU - Both Eyes       Right Eye Quality was good. Scan locations included subfoveal. Central Foveal Thickness: 251. Progression has improved. Findings include abnormal foveal contour, no SRF.   Left Eye Central Foveal Thickness: 248. Findings include abnormal foveal contour.   Notes OD with improved subretinal fluid, contour superior to the nerve.  OS, juxta foveal CNVM and fluid, improved 6 weeks post Avastin  OS, improved perimacular intraretinal and subretinal fluid currently now at 6-week follow-up post intravitreal Avastin.  We will repeat today and examination again in 6 weeks         Intravitreal Injection, Pharmacologic Agent - OS - Left Eye         Time Out 09/02/2020. 3:17 PM. Confirmed correct patient, procedure, site, and patient consented.   Anesthesia Topical anesthesia was used. Anesthetic medications included Akten 3.5%.   Procedure Preparation included Ofloxacin , 10% betadine to eyelids, 5% betadine to ocular surface. A 30 gauge needle was used.   Injection:  1.25 mg Bevacizumab (AVASTIN) SOLN   NDC: 70360-001-02, Lot: [id number]   Route: Intravitreal, Site: Left Eye, Waste: 0 mg  Post-op Post injection exam found visual acuity of at least counting fingers. The patient tolerated the procedure well. There were no complications. The patient received written and verbal post procedure care education. Post injection medications were not given.                 ASSESSMENT/PLAN:  Exudative age-related macular degeneration of left eye with active choroidal neovascularization (HCC) OS, much improved  Macular anatomy after recent injection intravitreal Avastin, less CNVM activity.  Repeat today  Exudative age-related macular degeneration of right eye with active choroidal neovascularization ([place]) OD follow-up as scheduled      ICD-10-CM   1. Exudative age-related macular degeneration of left eye with active choroidal neovascularization (HCC)  H35.3221 OCT, Retina - OU - Both Eyes    Intravitreal Injection, Pharmacologic Agent - OS - Left Eye    Bevacizumab (AVASTIN) SOLN 1.25 mg  2. Exudative age-related macular degeneration of right eye with active choroidal neovascularization ([place])  H35.3211     1.  Much improved macular anatomy left eye status post intravitreal  Avastin some 6 weeks previous we will repeat today to maintain stability 2.  OD, macula also improved some 1 month post Avastin repeat examination right eye as scheduled  3.  Ophthalmic Meds Ordered this visit:  Meds ordered this encounter  Medications   Bevacizumab (AVASTIN) SOLN 1.25 mg       Return in about 6 weeks (around 10/14/2020) for  dilate, AVASTIN OCT, OS.  There are no Patient Instructions on file for this visit.   Explained the diagnoses, plan, and follow up with the patient and they expressed understanding.  Patient expressed understanding of the importance of proper follow up care.   Clent Demark Kristion Holifield M.D. Diseases & Surgery of the Retina and Vitreous [facility] [place] 09/02/20     Abbreviations: M myopia (nearsighted); A astigmatism; H hyperopia (farsighted); P presbyopia; Mrx spectacle prescription;  CTL contact lenses; OD right eye; OS left eye; OU both eyes  XT exotropia; ET esotropia; PEK punctate epithelial keratitis; PEE punctate epithelial erosions; DES dry eye syndrome; MGD meibomian gland dysfunction; ATs artificial tears; PFAT's preservative free artificial tears; [place] nuclear sclerotic cataract; PSC posterior subcapsular cataract; ERM epi-retinal membrane; PVD posterior vitreous detachment; RD retinal detachment; DM diabetes mellitus; DR diabetic retinopathy; NPDR non-proliferative diabetic retinopathy; PDR proliferative diabetic retinopathy; CSME clinically significant macular edema; DME diabetic macular edema; dbh dot blot hemorrhages; CWS cotton wool spot; POAG primary open angle glaucoma; C/D cup-to-disc ratio; HVF humphrey visual field; GVF goldmann visual field; OCT optical coherence tomography; IOP intraocular pressure; BRVO Branch retinal vein occlusion; CRVO central retinal vein occlusion; CRAO central retinal artery occlusion; BRAO branch retinal artery occlusion; RT retinal tear; SB scleral buckle; PPV pars plana vitrectomy; VH Vitreous hemorrhage; PRP panretinal laser photocoagulation; IVK intravitreal kenalog; VMT vitreomacular traction; MH Macular hole;  NVD neovascularization of the disc; NVE neovascularization elsewhere; AREDS age related eye disease study; ARMD age related macular degeneration; POAG primary open angle glaucoma; EBMD epithelial/anterior basement membrane dystrophy; ACIOL  anterior chamber intraocular lens; IOL intraocular lens; PCIOL posterior chamber intraocular lens; Phaco/IOL phacoemulsification with intraocular lens placement; [place] photorefractive keratectomy; LASIK laser assisted in situ keratomileusis; HTN hypertension; DM diabetes mellitus; COPD chronic obstructive pulmonary disease

## 2020-09-02 NOTE — Assessment & Plan Note (Signed)
OD follow-up as scheduled 

## 2020-09-02 NOTE — Assessment & Plan Note (Signed)
OS, much improved  Macular anatomy after recent injection intravitreal Avastin, less CNVM activity.  Repeat today

## 2020-09-21 DIAGNOSIS — D75839 Thrombocytosis, unspecified: Secondary | ICD-10-CM | POA: Diagnosis not present

## 2020-09-21 DIAGNOSIS — I5032 Chronic diastolic (congestive) heart failure: Secondary | ICD-10-CM | POA: Diagnosis not present

## 2020-09-21 DIAGNOSIS — Z79899 Other long term (current) drug therapy: Secondary | ICD-10-CM | POA: Diagnosis not present

## 2020-09-21 DIAGNOSIS — I10 Essential (primary) hypertension: Secondary | ICD-10-CM | POA: Diagnosis not present

## 2020-09-28 DIAGNOSIS — Z23 Encounter for immunization: Secondary | ICD-10-CM | POA: Diagnosis not present

## 2020-09-28 DIAGNOSIS — I5032 Chronic diastolic (congestive) heart failure: Secondary | ICD-10-CM | POA: Diagnosis not present

## 2020-09-28 DIAGNOSIS — I10 Essential (primary) hypertension: Secondary | ICD-10-CM | POA: Diagnosis not present

## 2020-09-28 DIAGNOSIS — E785 Hyperlipidemia, unspecified: Secondary | ICD-10-CM | POA: Diagnosis not present

## 2020-10-03 DIAGNOSIS — J9601 Acute respiratory failure with hypoxia: Secondary | ICD-10-CM | POA: Diagnosis not present

## 2020-10-03 DIAGNOSIS — Z96652 Presence of left artificial knee joint: Secondary | ICD-10-CM | POA: Diagnosis not present

## 2020-10-03 DIAGNOSIS — J449 Chronic obstructive pulmonary disease, unspecified: Secondary | ICD-10-CM | POA: Diagnosis not present

## 2020-10-04 ENCOUNTER — Ambulatory Visit (INDEPENDENT_AMBULATORY_CARE_PROVIDER_SITE_OTHER): Payer: Medicare Other | Admitting: Ophthalmology

## 2020-10-04 ENCOUNTER — Other Ambulatory Visit: Payer: Self-pay

## 2020-10-04 ENCOUNTER — Encounter (INDEPENDENT_AMBULATORY_CARE_PROVIDER_SITE_OTHER): Payer: Self-pay | Admitting: Ophthalmology

## 2020-10-04 DIAGNOSIS — H353221 Exudative age-related macular degeneration, left eye, with active choroidal neovascularization: Secondary | ICD-10-CM

## 2020-10-04 DIAGNOSIS — H353211 Exudative age-related macular degeneration, right eye, with active choroidal neovascularization: Secondary | ICD-10-CM | POA: Diagnosis not present

## 2020-10-04 MED ORDER — BEVACIZUMAB CHEMO INJECTION 1.25MG/0.05ML SYRINGE FOR KALEIDOSCOPE
1.2500 mg | INTRAVITREAL | Status: AC | PRN
  Administered 2020-10-04: 1.25 mg via INTRAVITREAL

## 2020-10-04 NOTE — Assessment & Plan Note (Signed)
Currently at around 1 month post injection left eye with less subretinal fluid less active CNVM

## 2020-10-04 NOTE — Assessment & Plan Note (Signed)
Pericapillary CNVM, subretinal hemorrhage, resolving, currently at 10-week follow-up repeat injection today and examination in 3 months

## 2020-10-04 NOTE — Progress Notes (Signed)
10/04/2020     CHIEF COMPLAINT Patient presents for Retina Follow Up   HISTORY OF PRESENT ILLNESS: [name] is a 79 y.o. female who presents to the clinic today for:   HPI    Retina Follow Up    Patient presents with  Wet AMD.  In right eye.  This started 9 weeks ago.  Severity is mild.  Duration of 9 weeks.  Since onset it is stable.          Comments    9 Week AMD F/U OD, poss Avastin OD  Pt sts VA fluctuates with lighting OU. Pt sts VA OS seems darker.       Last edited by Rockie Neighbours, [place] on 10/04/2020  1:57 PM. (History)      Referring physician: [name], MD [address] [place],  [place] [address]  HISTORICAL INFORMATION:   Selected notes from the [facility]: No current outpatient medications on file. (Ophthalmic Drugs)   No current facility-administered medications for this visit. (Ophthalmic Drugs)   Current Outpatient Medications (Other)  Medication Sig  . albuterol (PROVENTIL HFA;VENTOLIN HFA) 108 (90 Base) MCG/ACT inhaler Inhale 2 puffs into the lungs every 6 (six) hours as needed for wheezing or shortness of breath. Reported on 02/07/2016  . aspirin EC 81 MG tablet Take 81 mg by mouth at bedtime.   Marland Kitchen atenolol (TENORMIN) 50 MG tablet Take 25 mg by mouth 2 (two) times daily.   . cloNIDine (CATAPRES) 0.3 MG tablet Take 0.15 mg by mouth 2 (two) times daily.   Marland Kitchen diltiazem (CARDIZEM CD) 180 MG 24 hr capsule Take 180 mg by mouth daily.   Marland Kitchen esomeprazole (NEXIUM) 40 MG capsule Take 1 capsule (40 mg total) by mouth every morning.  . furosemide (LASIX) 20 MG tablet Take 2 tablets (40 mg total) by mouth daily.  . furosemide (LASIX) 20 MG tablet   . hydroxyurea (HYDREA) 500 MG capsule Take 1 capsule (500 mg total) by mouth daily. May take with food to minimize GI side effects.  Marland Kitchen letrozole (FEMARA) 2.5 MG tablet Take 1 tablet by mouth once daily  . losartan (COZAAR) 100 MG tablet Take 100 mg by mouth every  evening.   . Simethicone (GAS RELIEF PO) Take 1 tablet by mouth daily.   . simvastatin (ZOCOR) 10 MG tablet Take 10 mg by mouth every evening.    No current facility-administered medications for this visit. (Other)      REVIEW OF SYSTEMS:    ALLERGIES Allergies  Allergen Reactions  . Carbamazepine Hives and Other (See Comments)    headache  . Sulfa Antibiotics     Rash    PAST MEDICAL HISTORY Past Medical History:  Diagnosis Date  . Anemia 04/02/2015  . Arthritis   . Atypical glandular cells on vaginal Papanicolaou smear 04/12/2016   Well get colpo and biopsy with JVF  . Bronchitis, chronic obstructive ([place]) since 2000's  . Cancer [facility])    dx with endometrial cancer   . Dysrhythmia   . GERD (gastroesophageal reflux disease)   . Gout   . Heart murmur   . Hiatal hernia   . Hiatal hernia 11/19/2012  . History of endometrial cancer 04/06/2016   Sp hysterectomy 2012  . Hypertension   . Ischemic colitis ([place])    03/2015  . Myeloproliferative disorder, JAK-2 positive 01/05/2014  . Obesity   . Obesity, morbid (more than 100 lbs  over ideal weight or BMI > 40) (HCC) 08/2011   Ht 5'7", wt 280 lb  . Pneumonia 09/2014  . Polycythemia vera(238.4) 01/18/2014  . PONV (postoperative nausea and vomiting)    post-op hypoxic resp failure 09/2011 and require Bi-Pap (possible r/t - pressure pulm edema vs asp PNA, COPD exacerbation, obesity hypoventilation  . Pulmonary hypertension ([place])   . Shortness of breath    unable to lay flat due to dyspnea  . Vaginal Pap smear, abnormal    Past Surgical History:  Procedure Laterality Date  . ABDOMINAL HYSTERECTOMY  09/12/2011   Procedure: HYSTERECTOMY ABDOMINAL;  Surgeon: Janie Morning, MD;  Location: [facility];  Service: Gynecology;  Laterality: N/A;  Total Abdominal Hysterectomy, Bilateral Salpingo Oophorectomy  . BREAST LUMPECTOMY Left 07/10/2017   LEFT BREAST LUMPECTOMY WITH RADIOACTIVE SEED AND SENTINEL LYMPH NODE BIOPSY   . BREAST LUMPECTOMY  WITH RADIOACTIVE SEED AND SENTINEL LYMPH NODE BIOPSY Left 07/10/2017   Procedure: LEFT BREAST LUMPECTOMY WITH RADIOACTIVE SEED AND SENTINEL LYMPH NODE BIOPSY;  Surgeon: Rolm Bookbinder, MD;  Location: [facility];  Service: General;  Laterality: Left;  . COLONOSCOPY  07/05/2012   Procedure: COLONOSCOPY;  Surgeon: Rogene Houston, MD;  Location: [facility];  Service: Endoscopy;  Laterality: N/A;  730  . EYE SURGERY     bilateral 8 - 12 yrs ago  . FLEXIBLE SIGMOIDOSCOPY N/A 04/03/2015   Procedure: FLEXIBLE SIGMOIDOSCOPY;  Surgeon: Rogene Houston, MD;  Location: [facility];  Service: Endoscopy;  Laterality: N/A;  . HYSTEROSCOPY WITH D & C  08/15/2011   Procedure: DILATATION AND CURETTAGE (D&C) /HYSTEROSCOPY;  Surgeon: Jonnie Kind, MD;  Location: [facility];  Service: Gynecology;  Laterality: N/A;  With Suction Curette  . JOINT REPLACEMENT     left knee replaced 2014  . KNEE ARTHROPLASTY Left 03/15/2015   Procedure: COMPUTER ASSISTED TOTAL KNEE ARTHROPLASTY;  Surgeon: Marybelle Killings, MD;  Location: [facility];  Service: Orthopedics;  Laterality: Left;  . SALPINGOOPHORECTOMY  09/12/2011   Procedure: SALPINGO OOPHERECTOMY;  Surgeon: Janie Morning, MD;  Location: [facility];  Service: Gynecology;  Laterality: Bilateral;    FAMILY HISTORY Family History  Problem Relation Age of Onset  . Other Mother   . Lung cancer Father   . Hypertension Son   . Anesthesia problems Neg Hx   . Hypotension Neg Hx   . Malignant hyperthermia Neg Hx   . Pseudochol deficiency Neg Hx     SOCIAL HISTORY Social History   Tobacco Use  . Smoking status: Never Smoker  . Smokeless tobacco: Never Used  Vaping Use  . Vaping Use: Never used  Substance Use Topics  . Alcohol use: No  . Drug use: No         OPHTHALMIC EXAM: Base Eye Exam    Visual Acuity (ETDRS)      Right Left   Dist [place] 20/25 +1 20/30 +2   Dist ph [place]  NI       Tonometry (Tonopen, 1:58 PM)      Right Left   Pressure 13 13       Pupils      Dark  Light Shape React APD   Right 3 2 Round Slow None   Left 2 1 Round Slow None       Visual Fields (Counting fingers)      Left Right    Full Full       Extraocular Movement      Right Left  Full Full       Neuro/Psych    Oriented x3: Yes   Mood/Affect: Normal       Dilation    Right eye: 1.0% Mydriacyl, 2.5% Phenylephrine @ 2:01 PM        Slit Lamp and Fundus Exam    External Exam      Right Left   External Normal Normal       Slit Lamp Exam      Right Left   Lids/Lashes Normal Normal   Conjunctiva/Sclera White and quiet White and quiet   Cornea Clear Clear   Anterior Chamber Deep and quiet Deep and quiet   Iris Round and reactive Round and reactive   Lens Posterior chamber intraocular lens Posterior chamber intraocular lens   Anterior Vitreous Normal Normal       Fundus Exam      Right Left   Posterior Vitreous Posterior vitreous detachment    Disc Normal.     C/D Ratio 0.15    Macula Hard drusen, no hemorrhage, no macular thickening    Vessels Normal    Periphery Subretinal hemorrhage, along the superotemporal arcade           IMAGING AND PROCEDURES  Imaging and Procedures for 10/04/20  OCT, Retina - OU - Both Eyes       Right Eye Quality was good. Scan locations included subfoveal. Central Foveal Thickness: 255. Progression has improved. Findings include no IRF, no SRF.   Left Eye Quality was good. Scan locations included subfoveal. Central Foveal Thickness: 245. Progression has improved. Findings include abnormal foveal contour.   Notes OD with the region nasal to the fovea, and.  Papillary much less active, at 10-week interval, will repeat injection today       Color Fundus Photography Optos - OU - Both Eyes       Right Eye Progression has improved. Disc findings include normal observations. Macula : drusen. Vessels : normal observations. Periphery : normal observations.   Left Eye Progression has improved. Disc findings include  normal observations. Macula : drusen.   Notes Much less subretinal hemorrhage.  Papillary from CNVM on therapy.  We will repeat injection today       Intravitreal Injection, Pharmacologic Agent - OD - Right Eye       Time Out 10/04/2020. 2:58 PM. Confirmed correct patient, procedure, site, and patient consented.   Anesthesia Topical anesthesia was used. Anesthetic medications included [place] 3.5%.   Procedure Preparation included Tobramycin 0.3%, Ofloxacin , 10% betadine to eyelids, 5% betadine to ocular surface. A supplied needle was used.   Injection:  1.25 mg Bevacizumab (AVASTIN) SOLN   NDC: 70360-001-02, Lot: [id number]   Route: Intravitreal, Site: Right Eye, Waste: 0 mg  Post-op Post injection exam found visual acuity of at least counting fingers. The patient tolerated the procedure well. There were no complications. The patient received written and verbal post procedure care education. Post injection medications were not given.                 ASSESSMENT/PLAN:  Exudative age-related macular degeneration of right eye with active choroidal neovascularization (HCC) Pericapillary CNVM, subretinal hemorrhage, resolving, currently at 10-week follow-up repeat injection today and examination in 3 months  Exudative age-related macular degeneration of left eye with active choroidal neovascularization (HCC) Currently at around 1 month post injection left eye with less subretinal fluid less active CNVM      ICD-10-CM   1. Exudative age-related macular degeneration of  right eye with active choroidal neovascularization ([place])  H35.3211 OCT, Retina - OU - Both Eyes    Color Fundus Photography Optos - OU - Both Eyes    Intravitreal Injection, Pharmacologic Agent - OD - Right Eye    Bevacizumab (AVASTIN) SOLN 1.25 mg  2. Exudative age-related macular degeneration of left eye with active choroidal neovascularization (HCC)  H35.3221 Intravitreal Injection, Pharmacologic Agent - OD -  Right Eye    Bevacizumab (AVASTIN) SOLN 1.25 mg    1.  Improved   PERIPapillary CNVM OD on intravitreal Avastin, at 10-week interval.  We will repeat today and examination only in 3 months  2.  Follow-up OS as scheduled in the near future  3.  Ophthalmic Meds Ordered this visit:  Meds ordered this encounter  Medications  . Bevacizumab (AVASTIN) SOLN 1.25 mg       Return in about 3 months (around 01/03/2021) for dilate, OD, no injection planned.  There are no Patient Instructions on file for this visit.   Explained the diagnoses, plan, and follow up with the patient and they expressed understanding.  Patient expressed understanding of the importance of proper follow up care.   Clent Demark Tayvia Faughnan M.D. Diseases & Surgery of the Retina and Vitreous [facility]ina [facility] Diabetic [place] 10/04/20     Abbreviations: M myopia (nearsighted); A astigmatism; H hyperopia (farsighted); P presbyopia; Mrx spectacle prescription;  CTL contact lenses; OD right eye; OS left eye; OU both eyes  XT exotropia; ET esotropia; PEK punctate epithelial keratitis; PEE punctate epithelial erosions; DES dry eye syndrome; MGD meibomian gland dysfunction; ATs artificial tears; PFAT's preservative free artificial tears; [place] nuclear sclerotic cataract; PSC posterior subcapsular cataract; ERM epi-retinal membrane; PVD posterior vitreous detachment; RD retinal detachment; DM diabetes mellitus; DR diabetic retinopathy; NPDR non-proliferative diabetic retinopathy; PDR proliferative diabetic retinopathy; CSME clinically significant macular edema; DME diabetic macular edema; dbh dot blot hemorrhages; CWS cotton wool spot; POAG primary open angle glaucoma; C/D cup-to-disc ratio; HVF humphrey visual field; GVF goldmann visual field; OCT optical coherence tomography; IOP intraocular pressure; BRVO Branch retinal vein occlusion; CRVO central retinal vein occlusion; CRAO central retinal artery occlusion; BRAO branch retinal artery  occlusion; RT retinal tear; SB scleral buckle; PPV pars plana vitrectomy; VH Vitreous hemorrhage; PRP panretinal laser photocoagulation; IVK intravitreal kenalog; VMT vitreomacular traction; MH Macular hole;  NVD neovascularization of the disc; NVE neovascularization elsewhere; AREDS age related eye disease study; ARMD age related macular degeneration; POAG primary open angle glaucoma; EBMD epithelial/anterior basement membrane dystrophy; ACIOL anterior chamber intraocular lens; IOL intraocular lens; PCIOL posterior chamber intraocular lens; Phaco/IOL phacoemulsification with intraocular lens placement; [place] photorefractive keratectomy; LASIK laser assisted in situ keratomileusis; HTN hypertension; DM diabetes mellitus; COPD chronic obstructive pulmonary disease

## 2020-10-07 ENCOUNTER — Telehealth: Payer: Self-pay | Admitting: Hematology and Oncology

## 2020-10-07 NOTE — Telephone Encounter (Signed)
Rescheduled per provider. Called and left a msg

## 2020-10-08 DIAGNOSIS — Z79899 Other long term (current) drug therapy: Secondary | ICD-10-CM | POA: Diagnosis not present

## 2020-10-08 DIAGNOSIS — I10 Essential (primary) hypertension: Secondary | ICD-10-CM | POA: Diagnosis not present

## 2020-10-08 DIAGNOSIS — I5032 Chronic diastolic (congestive) heart failure: Secondary | ICD-10-CM | POA: Diagnosis not present

## 2020-10-08 DIAGNOSIS — E785 Hyperlipidemia, unspecified: Secondary | ICD-10-CM | POA: Diagnosis not present

## 2020-10-14 ENCOUNTER — Encounter (INDEPENDENT_AMBULATORY_CARE_PROVIDER_SITE_OTHER): Payer: Self-pay | Admitting: Ophthalmology

## 2020-10-14 ENCOUNTER — Ambulatory Visit (INDEPENDENT_AMBULATORY_CARE_PROVIDER_SITE_OTHER): Payer: Medicare Other | Admitting: Ophthalmology

## 2020-10-14 ENCOUNTER — Other Ambulatory Visit: Payer: Self-pay

## 2020-10-14 DIAGNOSIS — H353211 Exudative age-related macular degeneration, right eye, with active choroidal neovascularization: Secondary | ICD-10-CM

## 2020-10-14 DIAGNOSIS — H353221 Exudative age-related macular degeneration, left eye, with active choroidal neovascularization: Secondary | ICD-10-CM

## 2020-10-14 MED ORDER — BEVACIZUMAB 2.5 MG/0.1ML IZ SOSY
2.5000 mg | PREFILLED_SYRINGE | INTRAVITREAL | Status: AC | PRN
  Administered 2020-10-14: 2.5 mg via INTRAVITREAL

## 2020-10-14 NOTE — Assessment & Plan Note (Signed)
Improved some 10 days post recent injection OD

## 2020-10-14 NOTE — Progress Notes (Signed)
10/14/2020     CHIEF COMPLAINT Patient presents for Retina Follow Up   HISTORY OF PRESENT ILLNESS: [name] is a 79 y.o. female who presents to the clinic today for:   HPI    Retina Follow Up    Patient presents with  Wet AMD.  In left eye.  Severity is moderate.  Duration of 6 weeks.  Since onset it is stable.  I, the attending physician,  performed the HPI with the patient and updated documentation appropriately.          Comments    6 Week Wet AMD f\u OD. Possible Avastin OS. OCT  Pt states no changes in vision. Denies complaints.       Last edited by [name] on 10/14/2020  1:42 PM. (History)      Referring physician: [name], MD [address] [place],  [place] [address]  HISTORICAL INFORMATION:   Selected notes from the MEDICAL RECORD NUMBER       CURRENT MEDICATIONS: No current outpatient medications on file. (Ophthalmic Drugs)   No current facility-administered medications for this visit. (Ophthalmic Drugs)   Current Outpatient Medications (Other)  Medication Sig  . albuterol (PROVENTIL HFA;VENTOLIN HFA) 108 (90 Base) MCG/ACT inhaler Inhale 2 puffs into the lungs every 6 (six) hours as needed for wheezing or shortness of breath. Reported on 02/07/2016  . aspirin EC 81 MG tablet Take 81 mg by mouth at bedtime.   Marland Kitchen atenolol (TENORMIN) 50 MG tablet Take 25 mg by mouth 2 (two) times daily.   . cloNIDine (CATAPRES) 0.3 MG tablet Take 0.15 mg by mouth 2 (two) times daily.   Marland Kitchen diltiazem (CARDIZEM CD) 180 MG 24 hr capsule Take 180 mg by mouth daily.   Marland Kitchen esomeprazole (NEXIUM) 40 MG capsule Take 1 capsule (40 mg total) by mouth every morning.  . furosemide (LASIX) 20 MG tablet Take 2 tablets (40 mg total) by mouth daily.  . furosemide (LASIX) 20 MG tablet   . hydroxyurea (HYDREA) 500 MG capsule Take 1 capsule (500 mg total) by mouth daily. May take with food to minimize GI side effects.  Marland Kitchen letrozole (FEMARA) 2.5 MG tablet Take 1 tablet by  mouth once daily  . losartan (COZAAR) 100 MG tablet Take 100 mg by mouth every evening.   . Simethicone (GAS RELIEF PO) Take 1 tablet by mouth daily.   . simvastatin (ZOCOR) 10 MG tablet Take 10 mg by mouth every evening.    No current facility-administered medications for this visit. (Other)      REVIEW OF SYSTEMS:    ALLERGIES Allergies  Allergen Reactions  . Carbamazepine Hives and Other (See Comments)    headache  . Sulfa Antibiotics     Rash    PAST MEDICAL HISTORY Past Medical History:  Diagnosis Date  . Anemia 04/02/2015  . Arthritis   . Atypical glandular cells on vaginal Papanicolaou smear 04/12/2016   Well get colpo and biopsy with JVF  . Bronchitis, chronic obstructive ([place]) since 2000's  . Cancer [facility])    dx with endometrial cancer   . Dysrhythmia   . GERD (gastroesophageal reflux disease)   . Gout   . Heart murmur   . Hiatal hernia   . Hiatal hernia 11/19/2012  . History of endometrial cancer 04/06/2016   Sp hysterectomy 2012  . Hypertension   . Ischemic colitis ([place])    03/2015  . Myeloproliferative disorder, JAK-2 positive 01/05/2014  . Obesity   .  Obesity, morbid (more than 100 lbs over ideal weight or BMI > 40) (HCC) 08/2011   Ht 5'7", wt 280 lb  . Pneumonia 09/2014  . Polycythemia vera(238.4) 01/18/2014  . PONV (postoperative nausea and vomiting)    post-op hypoxic resp failure 09/2011 and require Bi-Pap (possible r/t - pressure pulm edema vs asp PNA, COPD exacerbation, obesity hypoventilation  . Pulmonary hypertension ([place])   . Shortness of breath    unable to lay flat due to dyspnea  . Vaginal Pap smear, abnormal    Past Surgical History:  Procedure Laterality Date  . ABDOMINAL HYSTERECTOMY  09/12/2011   Procedure: HYSTERECTOMY ABDOMINAL;  Surgeon: Janie Morning, MD;  [facility];  Service: Gynecology;  Laterality: N/A;  Total Abdominal Hysterectomy, Bilateral Salpingo Oophorectomy  . BREAST LUMPECTOMY Left 07/10/2017   LEFT BREAST  LUMPECTOMY WITH RADIOACTIVE SEED AND SENTINEL LYMPH NODE BIOPSY   . BREAST LUMPECTOMY WITH RADIOACTIVE SEED AND SENTINEL LYMPH NODE BIOPSY Left 07/10/2017   Procedure: LEFT BREAST LUMPECTOMY WITH RADIOACTIVE SEED AND SENTINEL LYMPH NODE BIOPSY;  Surgeon: Rolm Bookbinder, MD;  Location: [place];  Service: General;  Laterality: Left;  . COLONOSCOPY  07/05/2012   Procedure: COLONOSCOPY;  Surgeon: Rogene Houston, MD;  [facility];  Service: Endoscopy;  Laterality: N/A;  730  . EYE SURGERY     bilateral 8 - 12 yrs ago  . FLEXIBLE SIGMOIDOSCOPY N/A 04/03/2015   Procedure: FLEXIBLE SIGMOIDOSCOPY;  Surgeon: Rogene Houston, MD;  [facility];  Service: Endoscopy;  Laterality: N/A;  . HYSTEROSCOPY WITH D & C  08/15/2011   Procedure: DILATATION AND CURETTAGE (D&C) /HYSTEROSCOPY;  Surgeon: Jonnie Kind, MD;  [facility];  Service: Gynecology;  Laterality: N/A;  With Suction Curette  . JOINT REPLACEMENT     left knee replaced 2014  . KNEE ARTHROPLASTY Left 03/15/2015   Procedure: COMPUTER ASSISTED TOTAL KNEE ARTHROPLASTY;  Surgeon: Marybelle Killings, MD;  Location: [place];  Service: Orthopedics;  Laterality: Left;  . SALPINGOOPHORECTOMY  09/12/2011   Procedure: SALPINGO OOPHERECTOMY;  Surgeon: Janie Morning, MD;  [facility];  Service: Gynecology;  Laterality: Bilateral;    FAMILY HISTORY Family History  Problem Relation Age of Onset  . Other Mother   . Lung cancer Father   . Hypertension Son   . Anesthesia problems Neg Hx   . Hypotension Neg Hx   . Malignant hyperthermia Neg Hx   . Pseudochol deficiency Neg Hx     SOCIAL HISTORY Social History   Tobacco Use  . Smoking status: Never Smoker  . Smokeless tobacco: Never Used  Vaping Use  . Vaping Use: Never used  Substance Use Topics  . Alcohol use: No  . Drug use: No         OPHTHALMIC EXAM: Base Eye Exam    Visual Acuity (Snellen - Linear)      Right Left   Dist [place] 20/25 20/25 -2       Tonometry  (Tonopen, 1:45 PM)      Right Left   Pressure 12 10       Pupils      Pupils Dark Light Shape React APD   Right PERRL 3 2 Round Slow None   Left PERRL 3 2 Round Slow None       Visual Fields (Counting fingers)      Left Right    Full Full       Neuro/Psych    Oriented x3:  Yes   Mood/Affect: Normal       Dilation    Left eye: 1.0% Mydriacyl, 2.5% Phenylephrine @ 1:45 PM        Slit Lamp and Fundus Exam    External Exam      Right Left   External Normal Normal       Slit Lamp Exam      Right Left   Lids/Lashes Normal Normal   Conjunctiva/Sclera White and quiet White and quiet   Cornea Clear Clear   Anterior Chamber Deep and quiet Deep and quiet   Iris Round and reactive Round and reactive   Lens Posterior chamber intraocular lens Posterior chamber intraocular lens   Anterior Vitreous Normal Normal       Fundus Exam      Right Left   Posterior Vitreous  Posterior vitreous detachment   Disc  Normal   C/D Ratio  0.25   Macula  no macular thickening, , Retinal pigment epithelial mottling, Hard drusen, no exudates, Pigmented atrophy   Vessels  Normal   Periphery  Normal          IMAGING AND PROCEDURES  Imaging and Procedures for 10/14/20  OCT, Retina - OU - Both Eyes       Right Eye Quality was good. Scan locations included subfoveal. Central Foveal Thickness: 256. Progression has improved. Findings include no IRF, no SRF.   Left Eye Quality was good. Scan locations included subfoveal. Central Foveal Thickness: 254. Progression has been stable. Findings include abnormal foveal contour, outer retinal tubulation, cystoid macular edema, subretinal fluid, intraretinal fluid, outer retinal atrophy.   Notes OS, still with macular thickening nasal to the fovea, intraretinal fluid, overall improved however, 6-week interval, will repeat injection OS today       Intravitreal Injection, Pharmacologic Agent - OS - Left Eye       Time Out 10/14/2020. 2:40 PM.  Confirmed correct patient, procedure, site, and patient consented.   Anesthesia Topical anesthesia was used. Anesthetic medications included Akten 3.5%.   Procedure Preparation included Ofloxacin , 10% betadine to eyelids, 5% betadine to ocular surface. A 30 gauge needle was used.   Injection:  2.5 mg Bevacizumab (AVASTIN) 2.5mg /0.64mL SOSY   NDC: 19147-829-56, Lot: [id number]   Route: Intravitreal, Site: Left Eye  Post-op Post injection exam found visual acuity of at least counting fingers. The patient tolerated the procedure well. There were no complications. The patient received written and verbal post procedure care education. Post injection medications were not given.                 ASSESSMENT/PLAN:  Exudative age-related macular degeneration of left eye with active choroidal neovascularization (HCC) OS, good acuity, still with para macular intraretinal fluid And outer retinal disruption of photoreceptor layer,, yet stable on intravitreal Avastin currently at 6-week follow-up interval.  Repeat today  Exudative age-related macular degeneration of right eye with active choroidal neovascularization (HCC) Improved some 10 days post recent injection OD      ICD-10-CM   1. Exudative age-related macular degeneration of left eye with active choroidal neovascularization (HCC)  H35.3221 OCT, Retina - OU - Both Eyes    Intravitreal Injection, Pharmacologic Agent - OS - Left Eye    bevacizumab (AVASTIN) SOSY 2.5 mg  2. Exudative age-related macular degeneration of right eye with active choroidal neovascularization (HCC)  H35.3211     1.  OS, vastly improved on intravitreal Avastin yet still active at 6-week interval with good acuity.  We will repeat injection OS today and examination again left eye in 6 weeks  2.  Incidental finding OD improved at 10 days post recent injection with peripapillary CNVM  3.  Ophthalmic Meds Ordered this visit:  Meds ordered this encounter   Medications  . bevacizumab (AVASTIN) SOSY 2.5 mg       Return in about 6 weeks (around 11/25/2020) for dilate, AVASTIN OCT.  There are no Patient Instructions on file for this visit.   Explained the diagnoses, plan, and follow up with the patient and they expressed understanding.  Patient expressed understanding of the importance of proper follow up care.   Clent Demark Deric Bocock M.D. Diseases & Surgery of the Retina and Vitreous Retina & Diabetic [place] 10/14/20     Abbreviations: M myopia (nearsighted); A astigmatism; H hyperopia (farsighted); P presbyopia; Mrx spectacle prescription;  CTL contact lenses; OD right eye; OS left eye; OU both eyes  XT exotropia; ET esotropia; PEK punctate epithelial keratitis; PEE punctate epithelial erosions; DES dry eye syndrome; MGD meibomian gland dysfunction; ATs artificial tears; PFAT's preservative free artificial tears; [place] nuclear sclerotic cataract; PSC posterior subcapsular cataract; ERM epi-retinal membrane; PVD posterior vitreous detachment; RD retinal detachment; DM diabetes mellitus; DR diabetic retinopathy; NPDR non-proliferative diabetic retinopathy; PDR proliferative diabetic retinopathy; CSME clinically significant macular edema; DME diabetic macular edema; dbh dot blot hemorrhages; CWS cotton wool spot; POAG primary open angle glaucoma; C/D cup-to-disc ratio; HVF humphrey visual field; GVF goldmann visual field; OCT optical coherence tomography; IOP intraocular pressure; BRVO Branch retinal vein occlusion; CRVO central retinal vein occlusion; CRAO central retinal artery occlusion; BRAO branch retinal artery occlusion; RT retinal tear; SB scleral buckle; PPV pars plana vitrectomy; VH Vitreous hemorrhage; PRP panretinal laser photocoagulation; IVK intravitreal kenalog; VMT vitreomacular traction; MH Macular hole;  NVD neovascularization of the disc; NVE neovascularization elsewhere; AREDS age related eye disease study; ARMD age related macular  degeneration; POAG primary open angle glaucoma; EBMD epithelial/anterior basement membrane dystrophy; ACIOL anterior chamber intraocular lens; IOL intraocular lens; PCIOL posterior chamber intraocular lens; Phaco/IOL phacoemulsification with intraocular lens placement; [place] photorefractive keratectomy; LASIK laser assisted in situ keratomileusis; HTN hypertension; DM diabetes mellitus; COPD chronic obstructive pulmonary disease

## 2020-10-14 NOTE — Assessment & Plan Note (Signed)
OS, good acuity, still with para macular intraretinal fluid And outer retinal disruption of photoreceptor layer,, yet stable on intravitreal Avastin currently at 6-week follow-up interval.  Repeat today

## 2020-11-01 ENCOUNTER — Telehealth: Payer: Self-pay | Admitting: Hematology and Oncology

## 2020-11-01 NOTE — Telephone Encounter (Signed)
Rescheduled follow-up appointments per 12/27 schedule message. Patient is aware of changes.

## 2020-11-02 ENCOUNTER — Other Ambulatory Visit: Payer: Medicare Other

## 2020-11-02 ENCOUNTER — Ambulatory Visit: Payer: Medicare Other | Admitting: Hematology and Oncology

## 2020-11-02 DIAGNOSIS — Z96652 Presence of left artificial knee joint: Secondary | ICD-10-CM | POA: Diagnosis not present

## 2020-11-02 DIAGNOSIS — J449 Chronic obstructive pulmonary disease, unspecified: Secondary | ICD-10-CM | POA: Diagnosis not present

## 2020-11-02 DIAGNOSIS — J9601 Acute respiratory failure with hypoxia: Secondary | ICD-10-CM | POA: Diagnosis not present

## 2020-11-09 ENCOUNTER — Other Ambulatory Visit: Payer: Self-pay | Admitting: Hematology and Oncology

## 2020-11-10 ENCOUNTER — Other Ambulatory Visit: Payer: Self-pay | Admitting: *Deleted

## 2020-11-10 MED ORDER — HYDROXYUREA 500 MG PO CAPS: 500.0000 mg | ORAL_CAPSULE | Freq: Every day | ORAL | 3 refills | Status: DC

## 2020-11-11 ENCOUNTER — Other Ambulatory Visit: Payer: Medicare Other

## 2020-11-11 ENCOUNTER — Ambulatory Visit: Payer: Medicare Other | Admitting: Hematology and Oncology

## 2020-11-17 ENCOUNTER — Ambulatory Visit: Payer: Self-pay

## 2020-11-17 ENCOUNTER — Ambulatory Visit (INDEPENDENT_AMBULATORY_CARE_PROVIDER_SITE_OTHER): Payer: Medicare Other | Admitting: Orthopaedic Surgery

## 2020-11-17 ENCOUNTER — Encounter: Payer: Self-pay | Admitting: Orthopaedic Surgery

## 2020-11-17 ENCOUNTER — Other Ambulatory Visit: Payer: Self-pay

## 2020-11-17 VITALS — Ht 65.0 in | Wt 275.0 lb

## 2020-11-17 DIAGNOSIS — M25511 Pain in right shoulder: Secondary | ICD-10-CM | POA: Diagnosis not present

## 2020-11-17 DIAGNOSIS — G8929 Other chronic pain: Secondary | ICD-10-CM

## 2020-11-17 MED ORDER — LIDOCAINE HCL 2 % IJ SOLN
2.0000 mL | INTRAMUSCULAR | Status: AC | PRN
  Administered 2020-11-17: 2 mL

## 2020-11-17 MED ORDER — BUPIVACAINE HCL 0.5 % IJ SOLN
2.0000 mL | INTRAMUSCULAR | Status: AC | PRN
  Administered 2020-11-17: 2 mL via INTRA_ARTICULAR

## 2020-11-17 NOTE — Progress Notes (Signed)
Office Visit Note   Patient: [name]           Date of Birth: 1941-01-23           MRN: [id number] Visit Date: 11/17/2020              Requested by: [name], MD [address] [place],  [place] [address] PCP: [name], MD   Assessment & Plan: Visit Diagnoses:  1. Chronic right shoulder pain     Plan: Insidious onset of right shoulder pain within the last several months or so.  There is some crepitation with motion and I am concerned she may have a rotator cuff tear.  X-rays were nondiagnostic.  We will inject the subacromial space with cortisone and monitor response.  Depending upon her symptoms we may want to consider an MRI scan.  She will let me know over the next 3 to 4 weeks how she did  Follow-Up Instructions: Return if symptoms worsen or fail to improve.   Orders:  Orders Placed This Encounter  Procedures  . Large Joint Inj: R subacromial bursa  . XR Shoulder Right   No orders of the defined types were placed in this encounter.     Procedures: Large Joint Inj: R subacromial bursa on 11/17/2020 2:34 PM Indications: pain and diagnostic evaluation Details: 25 G 1.5 in needle, anterolateral approach  Arthrogram: No  Medications: 2 mL lidocaine 2 %; 2 mL bupivacaine 0.5 %  2 mL betamethasone injected to the subacromial region anteriorly right shoulder with the Marcaine and Xylocaine Consent was given by the patient. Immediately prior to procedure a time out was called to verify the correct patient, procedure, equipment, support staff and site/side marked as required. Patient was prepped and draped in the usual sterile fashion.       Clinical Data: No additional findings.   Subjective: Chief Complaint  Patient presents with  . Right Shoulder - Pain  Patient presents today for right shoulder pain. She said that it has hurt for a long time, but worsened in the last two weeks. No known injury. Her pain level ranges from 5 to a 10. She has  difficulty sleeping at night. Decreased range of motion. She has taken over the counter medicine and Biofreeze.  Has some difficulty with overhead motion.  Not noticed any skin changes.  No numbness or tingling or neck pain  HPI  Review of Systems   Objective: Vital Signs: Ht 5\' 5"  (1.651 m)   Wt 275 lb (124.7 kg)   BMI 45.76 kg/m   Physical Exam Constitutional:      Appearance: She is well-developed and well-nourished.  HENT:     Mouth/Throat:     Mouth: Oropharynx is clear and moist.  Eyes:     Extraocular Movements: EOM normal.     Pupils: Pupils are equal, round, and reactive to light.  Pulmonary:     Effort: Pulmonary effort is normal.  Skin:    General: Skin is warm and dry.  Neurological:     Mental Status: She is alert and oriented to person, place, and time.  Psychiatric:        Mood and Affect: Mood and affect normal.        Behavior: Behavior normal.     Ortho Exam awake alert and oriented x3.  Comfortable sitting and in no acute distress.  Able to place right arm fully overhead with somewhat of a circuitous motion.  There was some  crepitation with internal extra rotation of the shoulder with minimally positive impingement testing.  A little bit of pain with empty can testing.  Good strength.  Biceps intact.  Skin intact.  No pain at the [facility] joint.  A little bit of prominence of the distal clavicle consistent with her x-rays showing a little superior elevation.  Good grip and release Specialty Comments:  No specialty comments available.  Imaging: XR Shoulder Right  Result Date: 11/17/2020 Films of the right shoulder obtained in 3 projections.  No acute changes.  There is some superior migration of the clavicle in relationship to the acromion.  No recent history of injury or trauma.  Mild degenerative changes at the [facility] joint.  Humeral head is centered about the glenoid.  Normal space between the humeral head and the acromion.  Some hypertrophy of the chromium.  No  ectopic calcification.  No obvious glenohumeral arthritis    PMFS History: Patient Active Problem List   Diagnosis Date Noted  . Pain in right shoulder 11/17/2020  . Exudative age-related macular degeneration of right eye with active choroidal neovascularization ([place]) 05/04/2020  . Subretinal hemorrhage of right eye 04/26/2020  . Serous detachment of retinal pigment epithelium of left eye 03/22/2020  . Exudative age-related macular degeneration of left eye with active choroidal neovascularization ([place]) 02/16/2020  . Intermediate stage nonexudative age-related macular degeneration of both eyes 02/16/2020  . Macular drusen, left 02/16/2020  . Posterior vitreous detachment of right eye 02/16/2020  . Body mass index 40.0-44.9, adult ([place]) 03/06/2019  . Unilateral primary osteoarthritis, right knee 03/06/2019  . CAP (community acquired pneumonia) 11/09/2017  . Macrocytosis 07/11/2017  . Respiratory complication, postoperative   . Breast cancer ([place]) left 07/04/2017  . Malignant neoplasm of upper-outer quadrant of left breast in female, estrogen receptor positive ([place]) 06/22/2017  . Endometrial cancer, grade I ([place]) 04/20/2016  . Encounter for follow-up surveillance of endometrial cancer 04/06/2016  . Hematochezia 04/02/2015  . Rectal bleeding 04/02/2015  . Anemia 04/02/2015  . Bronchitis, chronic obstructive ([place])   . Arthritis   . Hypertension   . Dysrhythmia   . Obesity   . Essential hypertension   . Status post total left knee replacement 03/15/2015  . Myeloproliferative disorder, JAK-2 positive 01/05/2014  . Essential thrombocytosis ([place]) 12/19/2013  . CMC arthritis, thumb, degenerative 06/03/2013  . Bone, giant cell tumor 06/03/2013  . GERD (gastroesophageal reflux disease) 11/19/2012  . Hiatal hernia 11/19/2012  . Acute respiratory failure with hypoxia ([place]) 09/13/2011  . Bilateral pulmonary infiltrates on chest x-ray 09/13/2011  . Leukocytosis 09/13/2011  . Hyperglycemia  09/13/2011  . Chronic lung disease 08/16/2011    Class: Chronic  . Obesity, morbid (more than 100 lbs over ideal weight or BMI > 40) ([place]) 08/07/2011  . KNEE, ARTHRITIS, DEGEN./OSTEO 05/24/2010   Past Medical History:  Diagnosis Date  . Anemia 04/02/2015  . Arthritis   . Atypical glandular cells on vaginal Papanicolaou smear 04/12/2016   Well get colpo and biopsy with JVF  . Bronchitis, chronic obstructive ([place]) since 2000's  . Cancer [facility])    dx with endometrial cancer   . Dysrhythmia   . GERD (gastroesophageal reflux disease)   . Gout   . Heart murmur   . Hiatal hernia   . Hiatal hernia 11/19/2012  . History of endometrial cancer 04/06/2016   Sp hysterectomy 2012  . Hypertension   . Ischemic colitis ([place])    03/2015  . Myeloproliferative disorder, JAK-2 positive 01/05/2014  . Obesity   .  Obesity, morbid (more than 100 lbs over ideal weight or BMI > 40) (HCC) 08/2011   Ht 5'7", wt 280 lb  . Pneumonia 09/2014  . Polycythemia vera(238.4) 01/18/2014  . PONV (postoperative nausea and vomiting)    post-op hypoxic resp failure 09/2011 and require Bi-Pap (possible r/t - pressure pulm edema vs asp PNA, COPD exacerbation, obesity hypoventilation  . Pulmonary hypertension ([place])   . Shortness of breath    unable to lay flat due to dyspnea  . Vaginal Pap smear, abnormal     Family History  Problem Relation Age of Onset  . Other Mother   . Lung cancer Father   . Hypertension Son   . Anesthesia problems Neg Hx   . Hypotension Neg Hx   . Malignant hyperthermia Neg Hx   . Pseudochol deficiency Neg Hx     Past Surgical History:  Procedure Laterality Date  . ABDOMINAL HYSTERECTOMY  09/12/2011   Procedure: HYSTERECTOMY ABDOMINAL;  Surgeon: Janie Morning, MD;  [facility];  Service: Gynecology;  Laterality: N/A;  Total Abdominal Hysterectomy, Bilateral Salpingo Oophorectomy  . BREAST LUMPECTOMY Left 07/10/2017   LEFT BREAST LUMPECTOMY WITH RADIOACTIVE SEED AND SENTINEL LYMPH NODE BIOPSY    . BREAST LUMPECTOMY WITH RADIOACTIVE SEED AND SENTINEL LYMPH NODE BIOPSY Left 07/10/2017   Procedure: LEFT BREAST LUMPECTOMY WITH RADIOACTIVE SEED AND SENTINEL LYMPH NODE BIOPSY;  Surgeon: Rolm Bookbinder, MD;  Location: [place];  Service: General;  Laterality: Left;  . COLONOSCOPY  07/05/2012   Procedure: COLONOSCOPY;  Surgeon: Rogene Houston, MD;  [facility];  Service: Endoscopy;  Laterality: N/A;  730  . EYE SURGERY     bilateral 8 - 12 yrs ago  . FLEXIBLE SIGMOIDOSCOPY N/A 04/03/2015   Procedure: FLEXIBLE SIGMOIDOSCOPY;  Surgeon: Rogene Houston, MD;  [facility];  Service: Endoscopy;  Laterality: N/A;  . HYSTEROSCOPY WITH D & C  08/15/2011   Procedure: DILATATION AND CURETTAGE (D&C) /HYSTEROSCOPY;  Surgeon: Jonnie Kind, MD;  [facility];  Service: Gynecology;  Laterality: N/A;  With Suction Curette  . JOINT REPLACEMENT     left knee replaced 2014  . KNEE ARTHROPLASTY Left 03/15/2015   Procedure: COMPUTER ASSISTED TOTAL KNEE ARTHROPLASTY;  Surgeon: Marybelle Killings, MD;  Location: [place];  Service: Orthopedics;  Laterality: Left;  . SALPINGOOPHORECTOMY  09/12/2011   Procedure: SALPINGO OOPHERECTOMY;  Surgeon: Janie Morning, MD;  [facility];  Service: Gynecology;  Laterality: Bilateral;   Social History   Occupational History  . Not on file  Tobacco Use  . Smoking status: Never Smoker  . Smokeless tobacco: Never Used  Vaping Use  . Vaping Use: Never used  Substance and Sexual Activity  . Alcohol use: No  . Drug use: No  . Sexual activity: Never    Birth control/protection: Surgical    Comment: hysterectomy

## 2020-11-25 ENCOUNTER — Encounter (INDEPENDENT_AMBULATORY_CARE_PROVIDER_SITE_OTHER): Payer: Medicare Other | Admitting: Ophthalmology

## 2020-11-29 ENCOUNTER — Inpatient Hospital Stay: Payer: Medicare Other

## 2020-11-29 ENCOUNTER — Inpatient Hospital Stay: Payer: Medicare Other | Admitting: Hematology and Oncology

## 2020-12-03 DIAGNOSIS — J9601 Acute respiratory failure with hypoxia: Secondary | ICD-10-CM | POA: Diagnosis not present

## 2020-12-03 DIAGNOSIS — Z96652 Presence of left artificial knee joint: Secondary | ICD-10-CM | POA: Diagnosis not present

## 2020-12-03 DIAGNOSIS — J449 Chronic obstructive pulmonary disease, unspecified: Secondary | ICD-10-CM | POA: Diagnosis not present

## 2020-12-06 ENCOUNTER — Encounter (INDEPENDENT_AMBULATORY_CARE_PROVIDER_SITE_OTHER): Payer: Self-pay | Admitting: Ophthalmology

## 2020-12-06 ENCOUNTER — Ambulatory Visit (INDEPENDENT_AMBULATORY_CARE_PROVIDER_SITE_OTHER): Payer: Medicare Other | Admitting: Ophthalmology

## 2020-12-06 ENCOUNTER — Other Ambulatory Visit: Payer: Self-pay

## 2020-12-06 DIAGNOSIS — H353211 Exudative age-related macular degeneration, right eye, with active choroidal neovascularization: Secondary | ICD-10-CM | POA: Diagnosis not present

## 2020-12-06 DIAGNOSIS — H353221 Exudative age-related macular degeneration, left eye, with active choroidal neovascularization: Secondary | ICD-10-CM

## 2020-12-06 MED ORDER — BEVACIZUMAB 2.5 MG/0.1ML IZ SOSY
2.5000 mg | PREFILLED_SYRINGE | INTRAVITREAL | Status: AC | PRN
  Administered 2020-12-06: 2.5 mg via INTRAVITREAL

## 2020-12-06 NOTE — Assessment & Plan Note (Signed)
Follow-up OD as scheduled, end of February 2022

## 2020-12-06 NOTE — Assessment & Plan Note (Signed)
OS improved overall with less subretinal fluid but still active lesion.  Currently at 7-week follow-up interval. We will suggest repeat injection today and follow-up in a shorter interval examination of 5 to 6 weeks

## 2020-12-06 NOTE — Progress Notes (Signed)
12/06/2020     CHIEF COMPLAINT Patient presents for Retina Follow Up (7 Week Wet AMD f\u OS. Possible Avastin OS. OCT/Pt states she has trouble seeing things at a distance. States they are not clear until she is closer.)   HISTORY OF PRESENT ILLNESS: [name] is a 80 y.o. female who presents to the clinic today for:   HPI    Retina Follow Up    Patient presents with  Wet AMD.  In left eye.  Severity is moderate.  Duration of 7 weeks.  Since onset it is stable.  I, the attending physician,  performed the HPI with the patient and updated documentation appropriately. Additional comments: 7 Week Wet AMD f\u OS. Possible Avastin OS. OCT Pt states she has trouble seeing things at a distance. States they are not clear until she is closer.       Last edited by [name] on 12/06/2020  2:24 PM. (History)      Referring physician: [name], MD [address] [place],  [place] [address]  HISTORICAL INFORMATION:   Selected notes from the MEDICAL RECORD NUMBER       CURRENT MEDICATIONS: No current outpatient medications on file. (Ophthalmic Drugs)   No current facility-administered medications for this visit. (Ophthalmic Drugs)   Current Outpatient Medications (Other)  Medication Sig  . albuterol (PROVENTIL HFA;VENTOLIN HFA) 108 (90 Base) MCG/ACT inhaler Inhale 2 puffs into the lungs every 6 (six) hours as needed for wheezing or shortness of breath. Reported on 02/07/2016  . aspirin EC 81 MG tablet Take 81 mg by mouth at bedtime.   Marland Kitchen atenolol (TENORMIN) 50 MG tablet Take 25 mg by mouth 2 (two) times daily.  . cloNIDine (CATAPRES) 0.3 MG tablet Take 0.15 mg by mouth 2 (two) times daily.  Marland Kitchen diltiazem (CARDIZEM CD) 180 MG 24 hr capsule Take 180 mg by mouth daily.   Marland Kitchen esomeprazole (NEXIUM) 40 MG capsule Take 1 capsule (40 mg total) by mouth every morning.  . furosemide (LASIX) 20 MG tablet Take 2 tablets (40 mg total) by mouth daily.  . furosemide (LASIX) 20 MG tablet    . hydroxyurea (HYDREA) 500 MG capsule Take 1 capsule (500 mg total) by mouth daily. May take with food to minimize GI side effects.  Marland Kitchen letrozole (FEMARA) 2.5 MG tablet Take 1 tablet by mouth once daily  . losartan (COZAAR) 100 MG tablet Take 100 mg by mouth every evening.   . Simethicone (GAS RELIEF PO) Take 1 tablet by mouth daily.  . simvastatin (ZOCOR) 10 MG tablet Take 10 mg by mouth every evening.    No current facility-administered medications for this visit. (Other)      REVIEW OF SYSTEMS:    ALLERGIES Allergies  Allergen Reactions  . Carbamazepine Hives and Other (See Comments)    headache  . Sulfa Antibiotics     Rash    PAST MEDICAL HISTORY Past Medical History:  Diagnosis Date  . Anemia 04/02/2015  . Arthritis   . Atypical glandular cells on vaginal Papanicolaou smear 04/12/2016   Well get colpo and biopsy with JVF  . Bronchitis, chronic obstructive ([place]) since 2000's  . Cancer [facility])    dx with endometrial cancer   . Dysrhythmia   . GERD (gastroesophageal reflux disease)   . Gout   . Heart murmur   . Hiatal hernia   . Hiatal hernia 11/19/2012  . History of endometrial cancer 04/06/2016   Sp hysterectomy 2012  .  Hypertension   . Ischemic colitis ([employer])    03/2015  . Myeloproliferative disorder, JAK-2 positive 01/05/2014  . Obesity   . Obesity, morbid (more than 100 lbs over ideal weight or BMI > 40) (HCC) 08/2011   Ht 5'7", wt 280 lb  . Pneumonia 09/2014  . Polycythemia vera(238.4) 01/18/2014  . PONV (postoperative nausea and vomiting)    post-op hypoxic resp failure 09/2011 and require Bi-Pap (possible r/t - pressure pulm edema vs asp PNA, COPD exacerbation, obesity hypoventilation  . Pulmonary hypertension ([place])   . Shortness of breath    unable to lay flat due to dyspnea  . Vaginal Pap smear, abnormal    Past Surgical History:  Procedure Laterality Date  . ABDOMINAL HYSTERECTOMY  09/12/2011   Procedure: HYSTERECTOMY ABDOMINAL;  Surgeon: Janie Morning,  MD;  Location: [facility];  Service: Gynecology;  Laterality: N/A;  Total Abdominal Hysterectomy, Bilateral Salpingo Oophorectomy  . BREAST LUMPECTOMY Left 07/10/2017   LEFT BREAST LUMPECTOMY WITH RADIOACTIVE SEED AND SENTINEL LYMPH NODE BIOPSY   . BREAST LUMPECTOMY WITH RADIOACTIVE SEED AND SENTINEL LYMPH NODE BIOPSY Left 07/10/2017   Procedure: LEFT BREAST LUMPECTOMY WITH RADIOACTIVE SEED AND SENTINEL LYMPH NODE BIOPSY;  Surgeon: Rolm Bookbinder, MD;  Location: [place];  Service: General;  Laterality: Left;  . COLONOSCOPY  07/05/2012   Procedure: COLONOSCOPY;  Surgeon: Rogene Houston, MD;  Location: [facility];  Service: Endoscopy;  Laterality: N/A;  730  . EYE SURGERY     bilateral 8 - 12 yrs ago  . FLEXIBLE SIGMOIDOSCOPY N/A 04/03/2015   Procedure: FLEXIBLE SIGMOIDOSCOPY;  Surgeon: Rogene Houston, MD;  [facility];  Service: Endoscopy;  Laterality: N/A;  . HYSTEROSCOPY WITH D & C  08/15/2011   Procedure: DILATATION AND CURETTAGE (D&C) /HYSTEROSCOPY;  Surgeon: Jonnie Kind, MD;  Location: [facility];  Service: Gynecology;  Laterality: N/A;  With Suction Curette  . JOINT REPLACEMENT     left knee replaced 2014  . KNEE ARTHROPLASTY Left 03/15/2015   Procedure: COMPUTER ASSISTED TOTAL KNEE ARTHROPLASTY;  Surgeon: Marybelle Killings, MD;  Location: [place];  Service: Orthopedics;  Laterality: Left;  . SALPINGOOPHORECTOMY  09/12/2011   Procedure: SALPINGO OOPHERECTOMY;  Surgeon: Janie Morning, MD;  Location: [facility];  Service: Gynecology;  Laterality: Bilateral;    FAMILY HISTORY Family History  Problem Relation Age of Onset  . Other Mother   . Lung cancer Father   . Hypertension Son   . Anesthesia problems Neg Hx   . Hypotension Neg Hx   . Malignant hyperthermia Neg Hx   . Pseudochol deficiency Neg Hx     SOCIAL HISTORY Social History   Tobacco Use  . Smoking status: Never Smoker  . Smokeless tobacco: Never Used  Vaping Use  . Vaping Use: Never used  Substance Use Topics  .  Alcohol use: No  . Drug use: No         OPHTHALMIC EXAM:  Base Eye Exam    Visual Acuity (Snellen - Linear)      Right Left   Dist [place] 20/25 +1 20/40 +2   Dist ph [place]  NI       Tonometry (Tonopen, 2:28 PM)      Right Left   Pressure 14 14       Pupils      Pupils Dark Light Shape React APD   Right PERRL 3 2 Round Brisk None   Left PERRL 3 2 Round Brisk None  Visual Fields (Counting fingers)      Left Right    Full Full       Neuro/Psych    Oriented x3: Yes   Mood/Affect: Normal       Dilation    Left eye: 1.0% Mydriacyl, 2.5% Phenylephrine @ 2:28 PM        Slit Lamp and Fundus Exam    External Exam      Right Left   External Normal Normal       Slit Lamp Exam      Right Left   Lids/Lashes Normal Normal   Conjunctiva/Sclera White and quiet White and quiet   Cornea Clear Clear   Anterior Chamber Deep and quiet Deep and quiet   Iris Round and reactive Round and reactive   Lens Posterior chamber intraocular lens Posterior chamber intraocular lens   Anterior Vitreous Normal Normal       Fundus Exam      Right Left   Posterior Vitreous  Posterior vitreous detachment   Disc  Normal   C/D Ratio  0.25   Macula  no macular thickening, , Retinal pigment epithelial mottling, Hard drusen, no exudates, Pigmented atrophy   Vessels  Normal   Periphery  Normal          IMAGING AND PROCEDURES  Imaging and Procedures for 12/06/20  OCT, Retina - OU - Both Eyes       Right Eye Quality was good. Scan locations included subfoveal. Central Foveal Thickness: 253. Progression has improved. Findings include no IRF, no SRF, retinal drusen .   Left Eye Quality was good. Scan locations included subfoveal. Central Foveal Thickness: 249. Progression has been stable. Findings include abnormal foveal contour, outer retinal tubulation, cystoid macular edema, subretinal fluid, intraretinal fluid, outer retinal atrophy.   Notes OS, still with macular thickening  nasal to the fovea, intraretinal fluid, overall improved however, 6-week interval, will repeat injection OS today       Intravitreal Injection, Pharmacologic Agent - OS - Left Eye       Time Out 12/06/2020. 3:03 PM. Confirmed correct patient, procedure, site, and patient consented.   Anesthesia Topical anesthesia was used. Anesthetic medications included [place] 3.5%.   Procedure Preparation included Ofloxacin , 10% betadine to eyelids, 5% betadine to ocular surface. A 30 gauge needle was used.   Injection:  2.5 mg Bevacizumab (AVASTIN) 2.5mg /0.62mL SOSY   NDC: [id number], [id number]   Route: Intravitreal, Site: Left Eye  Post-op Post injection exam found visual acuity of at least counting fingers. The patient tolerated the procedure well. There were no complications. The patient received written and verbal post procedure care education. Post injection medications were not given.                 ASSESSMENT/PLAN:  Exudative age-related macular degeneration of left eye with active choroidal neovascularization (HCC) OS improved overall with less subretinal fluid but still active lesion.  Currently at 7-week follow-up interval. We will suggest repeat injection today and follow-up in a shorter interval examination of 5 to 6 weeks  Exudative age-related macular degeneration of right eye with active choroidal neovascularization ([place]) Follow-up OD as scheduled, end of February 2022      ICD-10-CM   1. Exudative age-related macular degeneration of left eye with active choroidal neovascularization (HCC)  H35.3221 OCT, Retina - OU - Both Eyes    Intravitreal Injection, Pharmacologic Agent - OS - Left Eye    bevacizumab (AVASTIN) SOSY 2.5 mg  2. Exudative age-related macular degeneration of right eye with active choroidal neovascularization ([place])  H35.3211     1.  OS much less disease activity yet still active at 7 weeks we will repeat injection today and examination again in 6  weeks  2.  3.  Ophthalmic Meds Ordered this visit:  Meds ordered this encounter  Medications  . bevacizumab (AVASTIN) SOSY 2.5 mg       Return in about 6 weeks (around 01/17/2021) for dilate, OS, AVASTIN OCT.  There are no Patient Instructions on file for this visit.   Explained the diagnoses, plan, and follow up with the patient and they expressed understanding.  Patient expressed understanding of the importance of proper follow up care.   Clent Demark Rankin M.D. Diseases & Surgery of the Retina and Vitreous Retina & Diabetic [place] 12/06/20     Abbreviations: M myopia (nearsighted); A astigmatism; H hyperopia (farsighted); P presbyopia; Mrx spectacle prescription;  CTL contact lenses; OD right eye; OS left eye; OU both eyes  XT exotropia; ET esotropia; PEK punctate epithelial keratitis; PEE punctate epithelial erosions; DES dry eye syndrome; MGD meibomian gland dysfunction; ATs artificial tears; PFAT's preservative free artificial tears; [place] nuclear sclerotic cataract; PSC posterior subcapsular cataract; ERM epi-retinal membrane; PVD posterior vitreous detachment; RD retinal detachment; DM diabetes mellitus; DR diabetic retinopathy; NPDR non-proliferative diabetic retinopathy; PDR proliferative diabetic retinopathy; CSME clinically significant macular edema; DME diabetic macular edema; dbh dot blot hemorrhages; CWS cotton wool spot; POAG primary open angle glaucoma; C/D cup-to-disc ratio; HVF humphrey visual field; GVF goldmann visual field; OCT optical coherence tomography; IOP intraocular pressure; BRVO Branch retinal vein occlusion; CRVO central retinal vein occlusion; CRAO central retinal artery occlusion; BRAO branch retinal artery occlusion; RT retinal tear; SB scleral buckle; PPV pars plana vitrectomy; VH Vitreous hemorrhage; PRP panretinal laser photocoagulation; IVK intravitreal kenalog; VMT vitreomacular traction; MH Macular hole;  NVD neovascularization of the disc; NVE  neovascularization elsewhere; AREDS age related eye disease study; ARMD age related macular degeneration; POAG primary open angle glaucoma; EBMD epithelial/anterior basement membrane dystrophy; ACIOL anterior chamber intraocular lens; IOL intraocular lens; PCIOL posterior chamber intraocular lens; Phaco/IOL phacoemulsification with intraocular lens placement; [place] photorefractive keratectomy; LASIK laser assisted in situ keratomileusis; HTN hypertension; DM diabetes mellitus; COPD chronic obstructive pulmonary disease

## 2020-12-07 NOTE — Assessment & Plan Note (Deleted)
Essential thrombocytosis: Lab review: 11/03/2019: Platelet count 484  Current treatment: hydroxyurea 500mg  dailystarted 10/01/2018 Hydroxyurea toxicities:Denies any side effects to hydroxyurea

## 2020-12-07 NOTE — Assessment & Plan Note (Deleted)
07/10/2017 Left lumpectomy: IDC grade 2, 1.3 cm, intermediate grade DCIS, margins negative, 0/2 lymph nodes negative, ER 5% positive weak staining, PR 0%, HER-2 negative ratio 1.73, Ki-67 15%, T1cN0 stage IA AJCC 8  Mammaprint: Low risk luminal typeA, average 10-year risk of recurrence untreated 10% I counseled her extensively regarding the results of the Mammaprint and provided her with a copy of the test. Postoperative breast cellulitis: Treated and resolved with antibiotics Adj XRT 10/01/17- 11/15/17  Current treatment: Adjuvant antiestrogen therapy withLetrozole 1 mg daily times 5-7 yearsstarted 11/15/2017  Letrozole toxicities:Denies any hot flashes or myalgias denies any side effects  Breast cancer surveillance: 1.Breast exam  12/07/2020: Benign 2.Mammogram  05/20/2020: Benign breast density category B

## 2020-12-12 NOTE — Progress Notes (Incomplete)
Patient Care Team: [name], MD as PCP - General (Internal Medicine) Jonnie Kind, MD (Inactive) as Consulting Physician (Obstetrics and Gynecology) Janie Morning, MD as Attending Physician (Obstetrics and Gynecology) Carole Civil, MD as Consulting Physician (Orthopedic Surgery) Nicholas Lose, MD as Consulting Physician (Hematology and Oncology) Kyung Rudd, MD as Consulting Physician (Radiation Oncology) Rolm Bookbinder, MD as Consulting Physician (General Surgery) Delice Bison, Charlestine Massed, NP as Nurse Practitioner (Hematology and Oncology)  DIAGNOSIS:    ICD-10-CM   1. Malignant neoplasm of upper-outer quadrant of left breast in female, estrogen receptor positive ([place])  C50.412    Z17.0   2. Myeloproliferative disorder, JAK-2 positive  D47.1     SUMMARY OF ONCOLOGIC HISTORY: Oncology History  Malignant neoplasm of upper-outer quadrant of left breast in female, estrogen receptor positive ([place])  07/10/2017 Surgery   Left lumpectomy: IDC grade 2, 1.3 cm, intermediate grade DCIS, margins negative, 1/2 lymph nodes positive, ER 5% positive weak staining, PR 0%, HER-2 negative ratio 1.73, Ki-67 15%, T1cN0 stage IA AJCC 8   07/10/2017 Miscellaneous   Mammaprint: Low risk, 10-year risk of recurrence untreated 10%   10/01/2017 - 11/14/2017 Radiation Therapy   Adj XRT   11/2017 -  Anti-estrogen oral therapy   Letrozole daily     CHIEF COMPLIANT: Follow-up of left breast cancer on letrozole therapyand thrombocytosis on hydroxurea  INTERVAL HISTORY: [name] is a 80 y.o. with above-mentioned history of left breast cancer treated with lumpectomy, radiation, andwhois currently on letrozole.She also has a history of JAK2 mutation and is currently on hydrea 563m daily.Mammogram on 05/20/20 showed no evidence of malignancy bilaterally. She presents to the clinicalonetoday.     ALLERGIES:  is allergic to carbamazepine and sulfa antibiotics.  MEDICATIONS:  Current  Outpatient Medications  Medication Sig Dispense Refill  . albuterol (PROVENTIL HFA;VENTOLIN HFA) 108 (90 Base) MCG/ACT inhaler Inhale 2 puffs into the lungs every 6 (six) hours as needed for wheezing or shortness of breath. Reported on 02/07/2016 1 Inhaler 0  . aspirin EC 81 MG tablet Take 81 mg by mouth at bedtime.     .Marland Kitchenatenolol (TENORMIN) 50 MG tablet Take 25 mg by mouth 2 (two) times daily.    . cloNIDine (CATAPRES) 0.3 MG tablet Take 0.15 mg by mouth 2 (two) times daily.    .Marland Kitchendiltiazem (CARDIZEM CD) 180 MG 24 hr capsule Take 180 mg by mouth daily.     .Marland Kitchenesomeprazole (NEXIUM) 40 MG capsule Take 1 capsule (40 mg total) by mouth every morning. 30 capsule 11  . furosemide (LASIX) 20 MG tablet Take 2 tablets (40 mg total) by mouth daily. 60 tablet 0  . furosemide (LASIX) 20 MG tablet   12  . hydroxyurea (HYDREA) 500 MG capsule Take 1 capsule (500 mg total) by mouth daily. May take with food to minimize GI side effects. 90 capsule 3  . letrozole (FEMARA) 2.5 MG tablet Take 1 tablet by mouth once daily 90 tablet 0  . losartan (COZAAR) 100 MG tablet Take 100 mg by mouth every evening.     . Simethicone (GAS RELIEF PO) Take 1 tablet by mouth daily.    . simvastatin (ZOCOR) 10 MG tablet Take 10 mg by mouth every evening.      No current facility-administered medications for this visit.    PHYSICAL EXAMINATION: ECOG PERFORMANCE STATUS: {CHL ONC ECOG PS:[id number]}  There were no vitals filed for this visit. There were no vitals filed for this visit.  BREAST:***  No palpable masses or nodules in either right or left breasts. No palpable axillary supraclavicular or infraclavicular adenopathy no breast tenderness or nipple discharge. (exam performed in the presence of a chaperone)  LABORATORY DATA:  I have reviewed the data as listed CMP Latest Ref Rng & Units 11/03/2019 01/01/2019 11/12/2017  Glucose 70 - 99 mg/dL 93 119(H) 106(H)  BUN 8 - 23 mg/dL 17 17 16   Creatinine 0.44 - 1.00 mg/dL 0.78 0.92  0.55  Sodium 135 - 145 mmol/L 141 143 139  Potassium 3.5 - 5.1 mmol/L 4.5 3.6 3.9  Chloride 98 - 111 mmol/L 103 103 94(L)  CO2 22 - 32 mmol/L 30 29 37(H)  Calcium 8.9 - 10.3 mg/dL 9.3 9.7 8.9  Total Protein 6.5 - 8.1 g/dL 7.3 7.8 -  Total Bilirubin 0.3 - 1.2 mg/dL 0.6 0.9 -  Alkaline Phos 38 - 126 U/L 100 99 -  AST 15 - 41 U/L 15 19 -  ALT 0 - 44 U/L 10 14 -    Lab Results  Component Value Date   WBC 8.1 11/03/2019   HGB 15.3 (H) 11/03/2019   HCT 46.7 (H) 11/03/2019   MCV 104.2 (H) 11/03/2019   PLT 484 (H) 11/03/2019   NEUTROABS 5.7 11/03/2019    ASSESSMENT & PLAN:  No problem-specific Assessment & Plan notes found for this encounter.    No orders of the defined types were placed in this encounter.  The patient has a good understanding of the overall plan. she agrees with it. she will call with any problems that may develop before the next visit here.  Total time spent: *** mins including face to face time and time spent for planning, charting and coordination of care  Rulon Eisenmenger, MD, MPH 12/12/2020  I, Cloyde Reams Dorshimer, am acting as scribe for Dr. Nicholas Lose.  {insert scribe attestation}

## 2020-12-13 ENCOUNTER — Inpatient Hospital Stay: Payer: Medicare Other

## 2020-12-13 ENCOUNTER — Inpatient Hospital Stay: Payer: Medicare Other | Admitting: Hematology and Oncology

## 2020-12-13 ENCOUNTER — Telehealth: Payer: Self-pay | Admitting: Hematology and Oncology

## 2020-12-13 DIAGNOSIS — C50412 Malignant neoplasm of upper-outer quadrant of left female breast: Secondary | ICD-10-CM

## 2020-12-13 DIAGNOSIS — D471 Chronic myeloproliferative disease: Secondary | ICD-10-CM

## 2020-12-13 NOTE — Telephone Encounter (Signed)
Cancelled appointments per 2/7 sch msg and patient's request. Called patient who declined to reschedule. She said she would call back to reschedule the appointments at a later date.

## 2020-12-23 DIAGNOSIS — E785 Hyperlipidemia, unspecified: Secondary | ICD-10-CM | POA: Diagnosis not present

## 2020-12-23 DIAGNOSIS — I10 Essential (primary) hypertension: Secondary | ICD-10-CM | POA: Diagnosis not present

## 2020-12-23 DIAGNOSIS — I5032 Chronic diastolic (congestive) heart failure: Secondary | ICD-10-CM | POA: Diagnosis not present

## 2020-12-23 DIAGNOSIS — Z79899 Other long term (current) drug therapy: Secondary | ICD-10-CM | POA: Diagnosis not present

## 2020-12-30 DIAGNOSIS — I5032 Chronic diastolic (congestive) heart failure: Secondary | ICD-10-CM | POA: Diagnosis not present

## 2020-12-30 DIAGNOSIS — Z853 Personal history of malignant neoplasm of breast: Secondary | ICD-10-CM | POA: Diagnosis not present

## 2020-12-30 DIAGNOSIS — I10 Essential (primary) hypertension: Secondary | ICD-10-CM | POA: Diagnosis not present

## 2021-01-03 ENCOUNTER — Encounter (INDEPENDENT_AMBULATORY_CARE_PROVIDER_SITE_OTHER): Payer: Self-pay | Admitting: Ophthalmology

## 2021-01-03 ENCOUNTER — Encounter (INDEPENDENT_AMBULATORY_CARE_PROVIDER_SITE_OTHER): Payer: Medicare Other | Admitting: Ophthalmology

## 2021-01-03 ENCOUNTER — Ambulatory Visit (INDEPENDENT_AMBULATORY_CARE_PROVIDER_SITE_OTHER): Payer: Medicare Other | Admitting: Ophthalmology

## 2021-01-03 ENCOUNTER — Other Ambulatory Visit: Payer: Self-pay

## 2021-01-03 DIAGNOSIS — J449 Chronic obstructive pulmonary disease, unspecified: Secondary | ICD-10-CM | POA: Diagnosis not present

## 2021-01-03 DIAGNOSIS — J9601 Acute respiratory failure with hypoxia: Secondary | ICD-10-CM | POA: Diagnosis not present

## 2021-01-03 DIAGNOSIS — H353221 Exudative age-related macular degeneration, left eye, with active choroidal neovascularization: Secondary | ICD-10-CM

## 2021-01-03 DIAGNOSIS — H353211 Exudative age-related macular degeneration, right eye, with active choroidal neovascularization: Secondary | ICD-10-CM

## 2021-01-03 DIAGNOSIS — H3561 Retinal hemorrhage, right eye: Secondary | ICD-10-CM | POA: Diagnosis not present

## 2021-01-03 DIAGNOSIS — H353212 Exudative age-related macular degeneration, right eye, with inactive choroidal neovascularization: Secondary | ICD-10-CM

## 2021-01-03 DIAGNOSIS — Z96652 Presence of left artificial knee joint: Secondary | ICD-10-CM | POA: Diagnosis not present

## 2021-01-03 NOTE — Assessment & Plan Note (Signed)
Most recently this area was superior to the optic nerve onset June 2021 and is now resolved without any signs of spread to the macula, thus observe now since we are 3 months post injection right eye

## 2021-01-03 NOTE — Assessment & Plan Note (Signed)
Follow-up as scheduled left eye, examination again left eye in 2 weeks

## 2021-01-03 NOTE — Progress Notes (Signed)
01/03/2021     CHIEF COMPLAINT Patient presents for Retina Follow Up (3 Mo F/U OD, no planned injection//Pt denies noticeable changes to [place] OU since last visit. Pt denies ocular pain, flashes of light, or floaters OU. //)   HISTORY OF PRESENT ILLNESS: [name] is a 80 y.o. female who presents to the clinic today for:   HPI    Retina Follow Up    Patient presents with  Wet AMD.  In right eye.  This started 3 months ago.  Severity is mild.  Duration of 3 months.  Since onset it is stable. Additional comments: 3 Mo F/U OD, no planned injection  Pt denies noticeable changes to [place] OU since last visit. Pt denies ocular pain, flashes of light, or floaters OU.          Last edited by Rockie Neighbours, [place] on 01/03/2021  1:43 PM. (History)      Referring physician: [name], MD [address] [place],  [place] [address]  HISTORICAL INFORMATION:   Selected notes from the [facility]: No current outpatient medications on file. (Ophthalmic Drugs)   No current facility-administered medications for this visit. (Ophthalmic Drugs)   Current Outpatient Medications (Other)  Medication Sig  . albuterol (PROVENTIL HFA;VENTOLIN HFA) 108 (90 Base) MCG/ACT inhaler Inhale 2 puffs into the lungs every 6 (six) hours as needed for wheezing or shortness of breath. Reported on 02/07/2016  . aspirin EC 81 MG tablet Take 81 mg by mouth at bedtime.   Marland Kitchen atenolol (TENORMIN) 50 MG tablet Take 25 mg by mouth 2 (two) times daily.  . cloNIDine (CATAPRES) 0.3 MG tablet Take 0.15 mg by mouth 2 (two) times daily.  Marland Kitchen diltiazem (CARDIZEM CD) 180 MG 24 hr capsule Take 180 mg by mouth daily.   Marland Kitchen esomeprazole (NEXIUM) 40 MG capsule Take 1 capsule (40 mg total) by mouth every morning.  . furosemide (LASIX) 20 MG tablet Take 2 tablets (40 mg total) by mouth daily.  . furosemide (LASIX) 20 MG tablet   . hydroxyurea (HYDREA) 500 MG capsule Take 1 capsule (500 mg total)  by mouth daily. May take with food to minimize GI side effects.  Marland Kitchen letrozole (FEMARA) 2.5 MG tablet Take 1 tablet by mouth once daily  . losartan (COZAAR) 100 MG tablet Take 100 mg by mouth every evening.   . Simethicone (GAS RELIEF PO) Take 1 tablet by mouth daily.  . simvastatin (ZOCOR) 10 MG tablet Take 10 mg by mouth every evening.    No current facility-administered medications for this visit. (Other)      REVIEW OF SYSTEMS:    ALLERGIES Allergies  Allergen Reactions  . Carbamazepine Hives and Other (See Comments)    headache  . Sulfa Antibiotics     Rash    PAST MEDICAL HISTORY Past Medical History:  Diagnosis Date  . Anemia 04/02/2015  . Arthritis   . Atypical glandular cells on vaginal Papanicolaou smear 04/12/2016   Well get colpo and biopsy with JVF  . Bronchitis, chronic obstructive ([place]) since 2000's  . Cancer [facility])    dx with endometrial cancer   . Dysrhythmia   . GERD (gastroesophageal reflux disease)   . Gout   . Heart murmur   . Hiatal hernia   . Hiatal hernia 11/19/2012  . History of endometrial cancer 04/06/2016   Sp hysterectomy 2012  . Hypertension   . Ischemic colitis ([place])  03/2015  . Myeloproliferative disorder, JAK-2 positive 01/05/2014  . Obesity   . Obesity, morbid (more than 100 lbs over ideal weight or BMI > 40) (HCC) 08/2011   Ht 5'7", wt 280 lb  . Pneumonia 09/2014  . Polycythemia vera(238.4) 01/18/2014  . PONV (postoperative nausea and vomiting)    post-op hypoxic resp failure 09/2011 and require Bi-Pap (possible r/t - pressure pulm edema vs asp PNA, COPD exacerbation, obesity hypoventilation  . Pulmonary hypertension ([place])   . Shortness of breath    unable to lay flat due to dyspnea  . Vaginal Pap smear, abnormal    Past Surgical History:  Procedure Laterality Date  . ABDOMINAL HYSTERECTOMY  09/12/2011   Procedure: HYSTERECTOMY ABDOMINAL;  Surgeon: Janie Morning, MD;  [facility];  Service: Gynecology;  Laterality: N/A;  Total  Abdominal Hysterectomy, Bilateral Salpingo Oophorectomy  . BREAST LUMPECTOMY Left 07/10/2017   LEFT BREAST LUMPECTOMY WITH RADIOACTIVE SEED AND SENTINEL LYMPH NODE BIOPSY   . BREAST LUMPECTOMY WITH RADIOACTIVE SEED AND SENTINEL LYMPH NODE BIOPSY Left 07/10/2017   Procedure: LEFT BREAST LUMPECTOMY WITH RADIOACTIVE SEED AND SENTINEL LYMPH NODE BIOPSY;  Surgeon: Rolm Bookbinder, MD;  Location: [place];  Service: General;  Laterality: Left;  . COLONOSCOPY  07/05/2012   Procedure: COLONOSCOPY;  Surgeon: Rogene Houston, MD;  [facility];  Service: Endoscopy;  Laterality: N/A;  730  . EYE SURGERY     bilateral 8 - 12 yrs ago  . FLEXIBLE SIGMOIDOSCOPY N/A 04/03/2015   Procedure: FLEXIBLE SIGMOIDOSCOPY;  Surgeon: Rogene Houston, MD;  [facility];  Service: Endoscopy;  Laterality: N/A;  . HYSTEROSCOPY WITH D & C  08/15/2011   Procedure: DILATATION AND CURETTAGE (D&C) /HYSTEROSCOPY;  Surgeon: Jonnie Kind, MD;  [facility];  Service: Gynecology;  Laterality: N/A;  With Suction Curette  . JOINT REPLACEMENT     left knee replaced 2014  . KNEE ARTHROPLASTY Left 03/15/2015   Procedure: COMPUTER ASSISTED TOTAL KNEE ARTHROPLASTY;  Surgeon: Marybelle Killings, MD;  Location: [place];  Service: Orthopedics;  Laterality: Left;  . SALPINGOOPHORECTOMY  09/12/2011   Procedure: SALPINGO OOPHERECTOMY;  Surgeon: Janie Morning, MD;  [facility];  Service: Gynecology;  Laterality: Bilateral;    FAMILY HISTORY Family History  Problem Relation Age of Onset  . Other Mother   . Lung cancer Father   . Hypertension Son   . Anesthesia problems Neg Hx   . Hypotension Neg Hx   . Malignant hyperthermia Neg Hx   . Pseudochol deficiency Neg Hx     SOCIAL HISTORY Social History   Tobacco Use  . Smoking status: Never Smoker  . Smokeless tobacco: Never Used  Vaping Use  . Vaping Use: Never used  Substance Use Topics  . Alcohol use: No  . Drug use: No         OPHTHALMIC EXAM:  Base  Eye Exam    Visual Acuity (ETDRS)      Right Left   Dist [place] 20/25 +2 20/50 +2   Dist ph [place]  20/40 +1       Tonometry (Tonopen, 1:43 PM)      Right Left   Pressure 08 09       Pupils      Pupils Dark Light Shape React APD   Right PERRL 3 2 Round Brisk None   Left PERRL 3 2 Round Brisk None       Visual Fields (Counting fingers)  Left Right    Full Full       Extraocular Movement      Right Left    Full Full       Neuro/Psych    Oriented x3: Yes   Mood/Affect: Normal       Dilation    Right eye: 1.0% Mydriacyl, 2.5% Phenylephrine @ 1:47 PM        Slit Lamp and Fundus Exam    External Exam      Right Left   External Normal Normal       Slit Lamp Exam      Right Left   Lids/Lashes Normal Normal   Conjunctiva/Sclera White and quiet White and quiet   Cornea Clear Clear   Anterior Chamber Deep and quiet Deep and quiet   Iris Round and reactive Round and reactive   Lens Posterior chamber intraocular lens Posterior chamber intraocular lens   Anterior Vitreous Normal Normal       Fundus Exam      Right Left   Posterior Vitreous Posterior vitreous detachment    Disc Normal.     C/D Ratio 0.15    Macula Hard drusen, no hemorrhage, no macular thickening    Vessels Normal    Periphery Subretinal hemorrhage, along the superotemporal arcade           IMAGING AND PROCEDURES  Imaging and Procedures for 01/03/21  OCT, Retina - OU - Both Eyes       Right Eye Quality was good. Scan locations included subfoveal. Central Foveal Thickness: 253. Progression has been stable. Findings include no SRF, no IRF.   Left Eye Quality was good. Scan locations included subfoveal. Central Foveal Thickness: 242. Progression has improved.   Notes OD now at 13 weeks post fall follow-up injection Avastin. For region superotemporal to the nerve, no signs of active disease near the macula will observe                ASSESSMENT/PLAN:  Subretinal hemorrhage of  right eye Most recently this area was superior to the optic nerve onset June 2021 and is now resolved without any signs of spread to the macula, thus observe now since we are 3 months post injection right eye  Exudative age-related macular degeneration of right eye with inactive choroidal neovascularization ([place]) No signs of active disease today will observe  Exudative age-related macular degeneration of left eye with active choroidal neovascularization ([place]) Follow-up as scheduled left eye, examination again left eye in 2 weeks      ICD-10-CM   1. Exudative age-related macular degeneration of right eye with active choroidal neovascularization (HCC)  H35.3211 OCT, Retina - OU - Both Eyes  2. Subretinal hemorrhage of right eye  H35.61   3. Exudative age-related macular degeneration of right eye with inactive choroidal neovascularization ([place])  H35.3212   4. Exudative age-related macular degeneration of left eye with active choroidal neovascularization (HCC)  H35.3221     1.  2.  3.  Ophthalmic Meds Ordered this visit:  No orders of the defined types were placed in this encounter.      Return for As scheduled, dilate, OS, AVASTIN OCT.  There are no Patient Instructions on file for this visit.   Explained the diagnoses, plan, and follow up with the patient and they expressed understanding.  Patient expressed understanding of the importance of proper follow up care.   Clent Demark Rankin M.D. Diseases & Surgery of the Retina and Vitreous [facility] 01/03/21     Abbreviations: M myopia (nearsighted); A astigmatism; H hyperopia (farsighted); P presbyopia; Mrx spectacle prescription;  CTL contact lenses; OD right eye; OS left eye; OU both eyes  XT exotropia; ET esotropia; PEK punctate epithelial keratitis; PEE punctate epithelial erosions; DES dry eye syndrome; MGD meibomian gland dysfunction; ATs artificial tears; PFAT's preservative free artificial tears; [place]  nuclear sclerotic cataract; PSC posterior subcapsular cataract; ERM epi-retinal membrane; PVD posterior vitreous detachment; RD retinal detachment; DM diabetes mellitus; DR diabetic retinopathy; NPDR non-proliferative diabetic retinopathy; PDR proliferative diabetic retinopathy; CSME clinically significant macular edema; DME diabetic macular edema; dbh dot blot hemorrhages; CWS cotton wool spot; POAG primary open angle glaucoma; C/D cup-to-disc ratio; HVF humphrey visual field; GVF goldmann visual field; OCT optical coherence tomography; IOP intraocular pressure; BRVO Branch retinal vein occlusion; CRVO central retinal vein occlusion; CRAO central retinal artery occlusion; BRAO branch retinal artery occlusion; RT retinal tear; SB scleral buckle; PPV pars plana vitrectomy; VH Vitreous hemorrhage; PRP panretinal laser photocoagulation; IVK intravitreal kenalog; VMT vitreomacular traction; MH Macular hole;  NVD neovascularization of the disc; NVE neovascularization elsewhere; AREDS age related eye disease study; ARMD age related macular degeneration; POAG primary open angle glaucoma; EBMD epithelial/anterior basement membrane dystrophy; ACIOL anterior chamber intraocular lens; IOL intraocular lens; PCIOL posterior chamber intraocular lens; Phaco/IOL phacoemulsification with intraocular lens placement; [place] photorefractive keratectomy; LASIK laser assisted in situ keratomileusis; HTN hypertension; DM diabetes mellitus; COPD chronic obstructive pulmonary disease

## 2021-01-03 NOTE — Assessment & Plan Note (Signed)
No signs of active disease today will observe

## 2021-01-17 ENCOUNTER — Encounter (HOSPITAL_COMMUNITY): Payer: Self-pay

## 2021-01-17 ENCOUNTER — Encounter (INDEPENDENT_AMBULATORY_CARE_PROVIDER_SITE_OTHER): Payer: Medicare Other | Admitting: Ophthalmology

## 2021-01-17 NOTE — Progress Notes (Signed)
I have attempted to reach this patient today for an introductory phone call. I was unable to reach the patient at this time. I will plan to meet with her during her initial visit with Dr. Delton Coombes.

## 2021-01-18 ENCOUNTER — Ambulatory Visit (INDEPENDENT_AMBULATORY_CARE_PROVIDER_SITE_OTHER): Payer: Medicare Other | Admitting: Ophthalmology

## 2021-01-18 ENCOUNTER — Encounter (INDEPENDENT_AMBULATORY_CARE_PROVIDER_SITE_OTHER): Payer: Self-pay | Admitting: Ophthalmology

## 2021-01-18 ENCOUNTER — Other Ambulatory Visit: Payer: Self-pay

## 2021-01-18 DIAGNOSIS — H353212 Exudative age-related macular degeneration, right eye, with inactive choroidal neovascularization: Secondary | ICD-10-CM

## 2021-01-18 DIAGNOSIS — H353221 Exudative age-related macular degeneration, left eye, with active choroidal neovascularization: Secondary | ICD-10-CM | POA: Diagnosis not present

## 2021-01-18 MED ORDER — BEVACIZUMAB 2.5 MG/0.1ML IZ SOSY
2.5000 mg | PREFILLED_SYRINGE | INTRAVITREAL | Status: AC | PRN
Start: 2021-01-18 — End: 2021-01-18
  Administered 2021-01-18: 2.5 mg via INTRAVITREAL

## 2021-01-18 NOTE — Progress Notes (Signed)
01/18/2021     CHIEF COMPLAINT Patient presents for Retina Follow Up (6 Wk F/U OS, poss Avastin OS//Pt denies noticeable changes to [place] OU since last visit. Pt denies ocular pain, flashes of light, or floaters OU. //)   HISTORY OF PRESENT ILLNESS: [name] is a 80 y.o. female who presents to the clinic today for:   HPI    Retina Follow Up    Patient presents with  Wet AMD.  In left eye.  This started 6 weeks ago.  Severity is mild.  Duration of 6 weeks.  Since onset it is stable. Additional comments: 6 Wk F/U OS, poss Avastin OS  Pt denies noticeable changes to [place] OU since last visit. Pt denies ocular pain, flashes of light, or floaters OU.          Last edited by Rockie Neighbours, [place] on 01/18/2021  3:13 PM. (History)      Referring physician: [name], MD [address] [place],  [place] [address]  HISTORICAL INFORMATION:   Selected notes from the [facility]: No current outpatient medications on file. (Ophthalmic Drugs)   No current facility-administered medications for this visit. (Ophthalmic Drugs)   Current Outpatient Medications (Other)  Medication Sig  . albuterol (PROVENTIL HFA;VENTOLIN HFA) 108 (90 Base) MCG/ACT inhaler Inhale 2 puffs into the lungs every 6 (six) hours as needed for wheezing or shortness of breath. Reported on 02/07/2016  . aspirin EC 81 MG tablet Take 81 mg by mouth at bedtime.   Marland Kitchen atenolol (TENORMIN) 50 MG tablet Take 25 mg by mouth 2 (two) times daily.  . cloNIDine (CATAPRES) 0.3 MG tablet Take 0.15 mg by mouth 2 (two) times daily.  Marland Kitchen diltiazem (CARDIZEM CD) 180 MG 24 hr capsule Take 180 mg by mouth daily.   Marland Kitchen esomeprazole (NEXIUM) 40 MG capsule Take 1 capsule (40 mg total) by mouth every morning.  . furosemide (LASIX) 20 MG tablet Take 2 tablets (40 mg total) by mouth daily.  . furosemide (LASIX) 20 MG tablet   . hydroxyurea (HYDREA) 500 MG capsule Take 1 capsule (500 mg total) by mouth  daily. May take with food to minimize GI side effects.  Marland Kitchen letrozole (FEMARA) 2.5 MG tablet Take 1 tablet by mouth once daily  . losartan (COZAAR) 100 MG tablet Take 100 mg by mouth every evening.   . Simethicone (GAS RELIEF PO) Take 1 tablet by mouth daily.  . simvastatin (ZOCOR) 10 MG tablet Take 10 mg by mouth every evening.    No current facility-administered medications for this visit. (Other)      REVIEW OF SYSTEMS:    ALLERGIES Allergies  Allergen Reactions  . Carbamazepine Hives and Other (See Comments)    headache  . Sulfa Antibiotics     Rash    PAST MEDICAL HISTORY Past Medical History:  Diagnosis Date  . Anemia 04/02/2015  . Arthritis   . Atypical glandular cells on vaginal Papanicolaou smear 04/12/2016   Well get colpo and biopsy with JVF  . Bronchitis, chronic obstructive ([place]) since 2000's  . Cancer [facility])    dx with endometrial cancer   . Dysrhythmia   . GERD (gastroesophageal reflux disease)   . Gout   . Heart murmur   . Hiatal hernia   . Hiatal hernia 11/19/2012  . History of endometrial cancer 04/06/2016   Sp hysterectomy 2012  . Hypertension   . Ischemic colitis ([place])  03/2015  . Myeloproliferative disorder, JAK-2 positive 01/05/2014  . Obesity   . Obesity, morbid (more than 100 lbs over ideal weight or BMI > 40) (HCC) 08/2011   Ht 5'7", wt 280 lb  . Pneumonia 09/2014  . Polycythemia vera(238.4) 01/18/2014  . PONV (postoperative nausea and vomiting)    post-op hypoxic resp failure 09/2011 and require Bi-Pap (possible r/t - pressure pulm edema vs asp PNA, COPD exacerbation, obesity hypoventilation  . Pulmonary hypertension ([place])   . Shortness of breath    unable to lay flat due to dyspnea  . Vaginal Pap smear, abnormal    Past Surgical History:  Procedure Laterality Date  . ABDOMINAL HYSTERECTOMY  09/12/2011   Procedure: HYSTERECTOMY ABDOMINAL;  Surgeon: Janie Morning, MD;  [facility];  Service: Gynecology;  Laterality: N/A;  Total Abdominal  Hysterectomy, Bilateral Salpingo Oophorectomy  . BREAST LUMPECTOMY Left 07/10/2017   LEFT BREAST LUMPECTOMY WITH RADIOACTIVE SEED AND SENTINEL LYMPH NODE BIOPSY   . BREAST LUMPECTOMY WITH RADIOACTIVE SEED AND SENTINEL LYMPH NODE BIOPSY Left 07/10/2017   Procedure: LEFT BREAST LUMPECTOMY WITH RADIOACTIVE SEED AND SENTINEL LYMPH NODE BIOPSY;  Surgeon: Rolm Bookbinder, MD;  Location: [place];  Service: General;  Laterality: Left;  . COLONOSCOPY  07/05/2012   Procedure: COLONOSCOPY;  Surgeon: Rogene Houston, MD;  [facility];  Service: Endoscopy;  Laterality: N/A;  730  . EYE SURGERY     bilateral 8 - 12 yrs ago  . FLEXIBLE SIGMOIDOSCOPY N/A 04/03/2015   Procedure: FLEXIBLE SIGMOIDOSCOPY;  Surgeon: Rogene Houston, MD;  [facility];  Service: Endoscopy;  Laterality: N/A;  . HYSTEROSCOPY WITH D & C  08/15/2011   Procedure: DILATATION AND CURETTAGE (D&C) /HYSTEROSCOPY;  Surgeon: Jonnie Kind, MD;  [facility];  Service: Gynecology;  Laterality: N/A;  With Suction Curette  . JOINT REPLACEMENT     left knee replaced 2014  . KNEE ARTHROPLASTY Left 03/15/2015   Procedure: COMPUTER ASSISTED TOTAL KNEE ARTHROPLASTY;  Surgeon: Marybelle Killings, MD;  Location: [place];  Service: Orthopedics;  Laterality: Left;  . SALPINGOOPHORECTOMY  09/12/2011   Procedure: SALPINGO OOPHERECTOMY;  Surgeon: Janie Morning, MD;  [facility];  Service: Gynecology;  Laterality: Bilateral;    FAMILY HISTORY Family History  Problem Relation Age of Onset  . Other Mother   . Lung cancer Father   . Hypertension Son   . Anesthesia problems Neg Hx   . Hypotension Neg Hx   . Malignant hyperthermia Neg Hx   . Pseudochol deficiency Neg Hx     SOCIAL HISTORY Social History   Tobacco Use  . Smoking status: Never Smoker  . Smokeless tobacco: Never Used  Vaping Use  . Vaping Use: Never used  Substance Use Topics  . Alcohol use: No  . Drug use: No         OPHTHALMIC EXAM: Base Eye Exam     Visual Acuity (ETDRS)      Right Left   Dist [place] 20/20 20/50 +2   Dist ph [place]  NI       Tonometry (Tonopen, 3:14 PM)      Right Left   Pressure 09 09       Pupils      Pupils Dark Light Shape React APD   Right PERRL 3.5 2.5 Round Brisk None   Left PERRL 3.5 2.5 Round Brisk None       Visual Fields (Counting fingers)  Left Right    Full Full       Extraocular Movement      Right Left    Full Full       Neuro/Psych    Oriented x3: Yes   Mood/Affect: Normal       Dilation    Left eye: 1.0% Mydriacyl, 2.5% Phenylephrine @ 3:17 PM        Slit Lamp and Fundus Exam    External Exam      Right Left   External Normal Normal       Slit Lamp Exam      Right Left   Lids/Lashes Normal Normal   Conjunctiva/Sclera White and quiet White and quiet   Cornea Clear Clear   Anterior Chamber Deep and quiet Deep and quiet   Iris Round and reactive Round and reactive   Lens Posterior chamber intraocular lens Posterior chamber intraocular lens   Anterior Vitreous Normal Normal       Fundus Exam      Right Left   Posterior Vitreous  Posterior vitreous detachment   Disc  Normal   C/D Ratio  0.25   Macula  no macular thickening, , Retinal pigment epithelial mottling, Hard drusen, no exudates, Pigmented atrophy   Vessels  Normal   Periphery  Normal          IMAGING AND PROCEDURES  Imaging and Procedures for 01/18/21  OCT, Retina - OU - Both Eyes       Right Eye Quality was good. Scan locations included subfoveal. Central Foveal Thickness: 255. Progression has been stable. Findings include no SRF, no IRF.   Left Eye Quality was good. Scan locations included subfoveal. Central Foveal Thickness: 253. Progression has been stable.   Notes OS today at 6 weeks post injection we will repeat injection today and examination again in 6 weeks  OD today now 3 months post last injection stable no sign of CNVM       Intravitreal Injection, Pharmacologic Agent - OS - Left  Eye       Time Out 01/18/2021. 3:51 PM. Confirmed correct patient, procedure, site, and patient consented.   Anesthesia Topical anesthesia was used. Anesthetic medications included [place] 3.5%.   Procedure Preparation included Ofloxacin , 10% betadine to eyelids, 5% betadine to ocular surface. A 30 gauge needle was used.   Injection:  2.5 mg Bevacizumab (AVASTIN) 2.5mg /0.37mL SOSY   NDC: 02774-128-78, Lot: [id number]   Route: Intravitreal, Site: Left Eye  Post-op Post injection exam found visual acuity of at least counting fingers. The patient tolerated the procedure well. There were no complications. The patient received written and verbal post procedure care education. Post injection medications were not given.                 ASSESSMENT/PLAN:  Exudative age-related macular degeneration of left eye with active choroidal neovascularization ([place]) Overall improved yet with residual subretinal fluid nasal to the fovea.  Repeat injection today at 6-week interval and examination again in 6 weeks  Exudative age-related macular degeneration of right eye with inactive choroidal neovascularization ([place]) Now 3 months post last injection condition stable      ICD-10-CM   1. Exudative age-related macular degeneration of left eye with active choroidal neovascularization (HCC)  H35.3221 OCT, Retina - OU - Both Eyes    Intravitreal Injection, Pharmacologic Agent - OS - Left Eye    bevacizumab (AVASTIN) SOSY 2.5 mg  2. Exudative age-related macular degeneration of right eye with  inactive choroidal neovascularization ([place])  H35.3212     1.  OS looks great, improved anatomy and stable visual acuity yet still active disease.  Repeat intravitreal Avastin today at 6-week interval  2.  Dilate OU next, likely Avastin OS in 6 weeks  3.  Ophthalmic Meds Ordered this visit:  Meds ordered this encounter  Medications  . bevacizumab (AVASTIN) SOSY 2.5 mg       Return in about 6 weeks (around  03/01/2021) for dilate, OS, AVASTIN OCT.  There are no Patient Instructions on file for this visit.   Explained the diagnoses, plan, and follow up with the patient and they expressed understanding.  Patient expressed understanding of the importance of proper follow up care.   Clent Demark Rankin M.D. Diseases & Surgery of the Retina and Vitreous [facility] 01/18/21     Abbreviations: M myopia (nearsighted); A astigmatism; H hyperopia (farsighted); P presbyopia; Mrx spectacle prescription;  CTL contact lenses; OD right eye; OS left eye; OU both eyes  XT exotropia; ET esotropia; PEK punctate epithelial keratitis; PEE punctate epithelial erosions; DES dry eye syndrome; MGD meibomian gland dysfunction; ATs artificial tears; PFAT's preservative free artificial tears; [place] nuclear sclerotic cataract; PSC posterior subcapsular cataract; ERM epi-retinal membrane; PVD posterior vitreous detachment; RD retinal detachment; DM diabetes mellitus; DR diabetic retinopathy; NPDR non-proliferative diabetic retinopathy; PDR proliferative diabetic retinopathy; CSME clinically significant macular edema; DME diabetic macular edema; dbh dot blot hemorrhages; CWS cotton wool spot; POAG primary open angle glaucoma; C/D cup-to-disc ratio; HVF humphrey visual field; GVF goldmann visual field; OCT optical coherence tomography; IOP intraocular pressure; BRVO Branch retinal vein occlusion; CRVO central retinal vein occlusion; CRAO central retinal artery occlusion; BRAO branch retinal artery occlusion; RT retinal tear; SB scleral buckle; PPV pars plana vitrectomy; VH Vitreous hemorrhage; PRP panretinal laser photocoagulation; IVK intravitreal kenalog; VMT vitreomacular traction; MH Macular hole;  NVD neovascularization of the disc; NVE neovascularization elsewhere; AREDS age related eye disease study; ARMD age related macular degeneration; POAG primary open angle glaucoma; EBMD epithelial/anterior basement membrane  dystrophy; ACIOL anterior chamber intraocular lens; IOL intraocular lens; PCIOL posterior chamber intraocular lens; Phaco/IOL phacoemulsification with intraocular lens placement; [place] photorefractive keratectomy; LASIK laser assisted in situ keratomileusis; HTN hypertension; DM diabetes mellitus; COPD chronic obstructive pulmonary disease

## 2021-01-18 NOTE — Assessment & Plan Note (Signed)
Overall improved yet with residual subretinal fluid nasal to the fovea.  Repeat injection today at 6-week interval and examination again in 6 weeks

## 2021-01-18 NOTE — Assessment & Plan Note (Signed)
Now 3 months post last injection condition stable

## 2021-01-19 ENCOUNTER — Other Ambulatory Visit (HOSPITAL_COMMUNITY): Payer: Self-pay | Admitting: Hematology

## 2021-01-19 ENCOUNTER — Inpatient Hospital Stay (HOSPITAL_COMMUNITY): Payer: Medicare Other | Attending: Hematology | Admitting: Hematology

## 2021-01-19 ENCOUNTER — Encounter (HOSPITAL_COMMUNITY): Payer: Self-pay

## 2021-01-19 ENCOUNTER — Other Ambulatory Visit: Payer: Self-pay

## 2021-01-19 ENCOUNTER — Inpatient Hospital Stay (HOSPITAL_COMMUNITY): Payer: Medicare Other

## 2021-01-19 ENCOUNTER — Encounter (HOSPITAL_COMMUNITY): Payer: Self-pay | Admitting: Hematology

## 2021-01-19 VITALS — BP 120/49 | HR 58 | Temp 98.0°F | Resp 18 | Ht 64.0 in | Wt 238.5 lb

## 2021-01-19 DIAGNOSIS — Z79899 Other long term (current) drug therapy: Secondary | ICD-10-CM | POA: Insufficient documentation

## 2021-01-19 DIAGNOSIS — D473 Essential (hemorrhagic) thrombocythemia: Secondary | ICD-10-CM | POA: Diagnosis not present

## 2021-01-19 DIAGNOSIS — Z170 Estrogen receptor positive status [ER+]: Secondary | ICD-10-CM

## 2021-01-19 DIAGNOSIS — Z90710 Acquired absence of both cervix and uterus: Secondary | ICD-10-CM | POA: Diagnosis not present

## 2021-01-19 DIAGNOSIS — C50412 Malignant neoplasm of upper-outer quadrant of left female breast: Secondary | ICD-10-CM | POA: Insufficient documentation

## 2021-01-19 DIAGNOSIS — Z8249 Family history of ischemic heart disease and other diseases of the circulatory system: Secondary | ICD-10-CM | POA: Insufficient documentation

## 2021-01-19 DIAGNOSIS — Z801 Family history of malignant neoplasm of trachea, bronchus and lung: Secondary | ICD-10-CM | POA: Insufficient documentation

## 2021-01-19 DIAGNOSIS — R5383 Other fatigue: Secondary | ICD-10-CM | POA: Diagnosis not present

## 2021-01-19 DIAGNOSIS — Z8542 Personal history of malignant neoplasm of other parts of uterus: Secondary | ICD-10-CM | POA: Diagnosis not present

## 2021-01-19 DIAGNOSIS — Z90722 Acquired absence of ovaries, bilateral: Secondary | ICD-10-CM | POA: Diagnosis not present

## 2021-01-19 DIAGNOSIS — E669 Obesity, unspecified: Secondary | ICD-10-CM | POA: Diagnosis not present

## 2021-01-19 DIAGNOSIS — Z79811 Long term (current) use of aromatase inhibitors: Secondary | ICD-10-CM | POA: Diagnosis not present

## 2021-01-19 DIAGNOSIS — Z882 Allergy status to sulfonamides status: Secondary | ICD-10-CM | POA: Diagnosis not present

## 2021-01-19 DIAGNOSIS — M791 Myalgia, unspecified site: Secondary | ICD-10-CM | POA: Insufficient documentation

## 2021-01-19 DIAGNOSIS — E559 Vitamin D deficiency, unspecified: Secondary | ICD-10-CM | POA: Insufficient documentation

## 2021-01-19 DIAGNOSIS — G479 Sleep disorder, unspecified: Secondary | ICD-10-CM | POA: Diagnosis not present

## 2021-01-19 DIAGNOSIS — R252 Cramp and spasm: Secondary | ICD-10-CM | POA: Diagnosis not present

## 2021-01-19 LAB — CBC WITH DIFFERENTIAL/PLATELET
Abs Immature Granulocytes: 0.02 10*3/uL (ref 0.00–0.07)
Basophils Absolute: 0.1 10*3/uL (ref 0.0–0.1)
Basophils Relative: 1 %
Eosinophils Absolute: 0.4 10*3/uL (ref 0.0–0.5)
Eosinophils Relative: 4 %
HCT: 47.8 % — ABNORMAL HIGH (ref 36.0–46.0)
Hemoglobin: 15.2 g/dL — ABNORMAL HIGH (ref 12.0–15.0)
Immature Granulocytes: 0 %
Lymphocytes Relative: 19 %
Lymphs Abs: 1.7 10*3/uL (ref 0.7–4.0)
MCH: 33.9 pg (ref 26.0–34.0)
MCHC: 31.8 g/dL (ref 30.0–36.0)
MCV: 106.5 fL — ABNORMAL HIGH (ref 80.0–100.0)
Monocytes Absolute: 0.7 10*3/uL (ref 0.1–1.0)
Monocytes Relative: 8 %
Neutro Abs: 6.2 10*3/uL (ref 1.7–7.7)
Neutrophils Relative %: 68 %
Platelets: 580 10*3/uL — ABNORMAL HIGH (ref 150–400)
RBC: 4.49 MIL/uL (ref 3.87–5.11)
RDW: 14.2 % (ref 11.5–15.5)
WBC: 9.2 10*3/uL (ref 4.0–10.5)
nRBC: 0 % (ref 0.0–0.2)

## 2021-01-19 LAB — COMPREHENSIVE METABOLIC PANEL
ALT: 11 U/L (ref 0–44)
AST: 18 U/L (ref 15–41)
Albumin: 3.9 g/dL (ref 3.5–5.0)
Alkaline Phosphatase: 80 U/L (ref 38–126)
Anion gap: 10 (ref 5–15)
BUN: 24 mg/dL — ABNORMAL HIGH (ref 8–23)
CO2: 29 mmol/L (ref 22–32)
Calcium: 9.1 mg/dL (ref 8.9–10.3)
Chloride: 100 mmol/L (ref 98–111)
Creatinine, Ser: 0.67 mg/dL (ref 0.44–1.00)
GFR, Estimated: >60 mL/min (ref >60)
Glucose, Bld: 95 mg/dL (ref 70–99)
Potassium: 4.9 mmol/L (ref 3.5–5.1)
Sodium: 139 mmol/L (ref 135–145)
Total Bilirubin: 0.7 mg/dL (ref 0.3–1.2)
Total Protein: 7.6 g/dL (ref 6.5–8.1)

## 2021-01-19 LAB — LACTATE DEHYDROGENASE: LDH: 169 U/L (ref 98–192)

## 2021-01-19 LAB — VITAMIN D 25 HYDROXY (VIT D DEFICIENCY, FRACTURES): Vit D, 25-Hydroxy: 12.26 ng/mL — ABNORMAL LOW (ref 30–100)

## 2021-01-19 MED ORDER — HYDROXYUREA 500 MG PO CAPS: ORAL_CAPSULE | ORAL | 3 refills | Status: DC

## 2021-01-19 MED ORDER — ERGOCALCIFEROL 1.25 MG (50000 UT) PO CAPS: 50000.0000 [IU] | ORAL_CAPSULE | ORAL | 1 refills | Status: DC

## 2021-01-19 NOTE — Progress Notes (Signed)
[facility] [address], [place] [address]   Patient Care Team: [name], MD as PCP - General (Internal Medicine) Jonnie Kind, MD (Inactive) as Consulting Physician (Obstetrics and Gynecology) Janie Morning, MD as Attending Physician (Obstetrics and Gynecology) Carole Civil, MD as Consulting Physician (Orthopedic Surgery) Nicholas Lose, MD as Consulting Physician (Hematology and Oncology) Kyung Rudd, MD as Consulting Physician (Radiation Oncology) Rolm Bookbinder, MD as Consulting Physician (General Surgery) Delice Bison, Charlestine Massed, NP as Nurse Practitioner (Hematology and Oncology) Brien Mates, RN as Oncology Nurse Navigator (Oncology)  CHIEF COMPLAINTS/PURPOSE OF CONSULTATION:  History of left breast cancer and thrombocytosis  HISTORY OF PRESENTING ILLNESS:  [name] 80 y.o. female is here because of history of left breast cancer and thrombocytosis, at the request of Dr. Nicholas Lose.  Today she reports feeling okay. Dr. Willey Blade keeps track of her blood regularly and she last saw him 3 weeks ago. She is taking Hydrea daily and tolerating it well. She has a history of endometrial cancer which was treated by [facility]AH without adjuvant chemo or radiation. She denies having aquagenic pruritis, burning sensation, N/V/D or Raynaud phenomenon. She denies having history of DVT's, MI's or CVA's. She is able to do her chores and ADL's. She is taking Arimidex daily and reports having cramping in her thighs for the past few years, but denies having hot flashes or joint aches; the cramping is worse if she does not stay hydrated.  She lives at home and her husband recently passed on 01/19/2023. She continues to work in an office. She is a non-smoker. Her father had lung cancer from drinking and smoking.  I reviewed her records extensively and collaborated the history with the patient.  SUMMARY OF ONCOLOGIC HISTORY: Oncology History  Malignant  neoplasm of upper-outer quadrant of left breast in female, estrogen receptor positive ([place])  07/10/2017 Surgery   Left lumpectomy: IDC grade 2, 1.3 cm, intermediate grade DCIS, margins negative, 1/2 lymph nodes positive, ER 5% positive weak staining, PR 0%, HER-2 negative ratio 1.73, Ki-67 15%, T1cN0 stage IA AJCC 8   07/10/2017 Miscellaneous   Mammaprint: Low risk, 10-year risk of recurrence untreated 10%   10/01/2017 - 11/14/2017 Radiation Therapy   Adj XRT   11/2017 -  Anti-estrogen oral therapy   Letrozole daily     In terms of breast cancer risk profile:  She menarched at early age of 60 and went to menopause at age mid 85's.  She had 5 pregnancies, her first child was born at age 51.  She received birth control pills for approximately 4 years.  She was never exposed to fertility medications or hormone replacement therapy.  She has no family history of Breast/GYN/GI cancer  MEDICAL HISTORY:  Past Medical History:  Diagnosis Date  . Anemia 04/02/2015  . Arthritis   . Atypical glandular cells on vaginal Papanicolaou smear 04/12/2016   Well get colpo and biopsy with JVF  . Bronchitis, chronic obstructive ([place]) since 2000's  . Cancer [facility])    dx with endometrial cancer   . Dysrhythmia   . GERD (gastroesophageal reflux disease)   . Gout   . Heart murmur   . Hiatal hernia   . Hiatal hernia 11/19/2012  . History of endometrial cancer 04/06/2016   Sp hysterectomy 2012  . Hypertension   . Ischemic colitis ([place])    03/2015  . Myeloproliferative disorder, JAK-2 positive 01/05/2014  . Obesity   . Obesity, morbid (more than  100 lbs over ideal weight or BMI > 40) ([facility]) 08/2011   Ht 5'7", wt 280 lb  . Pneumonia 09/2014  . Polycythemia vera(238.4) 01/18/2014  . PONV (postoperative nausea and vomiting)    post-op hypoxic resp failure 09/2011 and require Bi-Pap (possible r/t - pressure pulm edema vs asp PNA, COPD exacerbation, obesity hypoventilation  . Pulmonary hypertension ([place])   .  Shortness of breath    unable to lay flat due to dyspnea  . Vaginal Pap smear, abnormal     SURGICAL HISTORY: Past Surgical History:  Procedure Laterality Date  . ABDOMINAL HYSTERECTOMY  09/12/2011   Procedure: HYSTERECTOMY ABDOMINAL;  Surgeon: Janie Morning, MD;  Location: [facility];  Service: Gynecology;  Laterality: N/A;  Total Abdominal Hysterectomy, Bilateral Salpingo Oophorectomy  . BREAST LUMPECTOMY Left 07/10/2017   LEFT BREAST LUMPECTOMY WITH RADIOACTIVE SEED AND SENTINEL LYMPH NODE BIOPSY   . BREAST LUMPECTOMY WITH RADIOACTIVE SEED AND SENTINEL LYMPH NODE BIOPSY Left 07/10/2017   Procedure: LEFT BREAST LUMPECTOMY WITH RADIOACTIVE SEED AND SENTINEL LYMPH NODE BIOPSY;  Surgeon: Rolm Bookbinder, MD;  Location: [facility];  Service: General;  Laterality: Left;  . COLONOSCOPY  07/05/2012   Procedure: COLONOSCOPY;  Surgeon: Rogene Houston, MD;  Location: [facility];  Service: Endoscopy;  Laterality: N/A;  730  . EYE SURGERY     bilateral 8 - 12 yrs ago  . FLEXIBLE SIGMOIDOSCOPY N/A 04/03/2015   Procedure: FLEXIBLE SIGMOIDOSCOPY;  Surgeon: Rogene Houston, MD;  [facility]: [facility];  Service: Endoscopy;  Laterality: N/A;  . HYSTEROSCOPY WITH D & C  08/15/2011   Procedure: DILATATION AND CURETTAGE (D&C) /HYSTEROSCOPY;  Surgeon: Jonnie Kind, MD;  Location: [facility];  Service: Gynecology;  Laterality: N/A;  With Suction Curette  . JOINT REPLACEMENT     left knee replaced 2014  . KNEE ARTHROPLASTY Left 03/15/2015   Procedure: COMPUTER ASSISTED TOTAL KNEE ARTHROPLASTY;  Surgeon: Marybelle Killings, MD;  Location: [facility];  Service: Orthopedics;  Laterality: Left;  . SALPINGOOPHORECTOMY  09/12/2011   Procedure: SALPINGO OOPHERECTOMY;  Surgeon: Janie Morning, MD;  Location: [facility];  Service: Gynecology;  Laterality: Bilateral;    SOCIAL HISTORY: Social History   Socioeconomic History  . Marital status: Married    Spouse name: Not on file  . Number of children: Not on file  . Years of education:  Not on file  . Highest education level: Not on file  Occupational History  . Not on file  Tobacco Use  . Smoking status: Never Smoker  . Smokeless tobacco: Never Used  Vaping Use  . Vaping Use: Never used  Substance and Sexual Activity  . Alcohol use: No  . Drug use: No  . Sexual activity: Never    Birth control/protection: Surgical    Comment: hysterectomy  Other Topics Concern  . Not on file  Social History Narrative  . Not on file   Social Determinants of Health   Financial Resource Strain: Low Risk   . Difficulty of Paying Living Expenses: Not hard at all  Food Insecurity: No Food Insecurity  . Worried About Charity fundraiser in the Last Year: Never true  . Ran Out of Food in the Last Year: Never true  Transportation Needs: No Transportation Needs  . Lack of Transportation (Medical): No  . Lack of Transportation (Non-Medical): No  Physical Activity: Insufficiently Active  . Days of Exercise per Week: 3 days  . Minutes of Exercise per Session: 30 min  Stress: No Stress Concern Present  . Feeling of Stress : Only a little  Social Connections: Moderately Integrated  . Frequency of Communication with Friends and Family: More than three times a week  . Frequency of Social Gatherings with Friends and Family: More than three times a week  . Attends Religious Services: More than 4 times per year  . Active Member of Clubs or Organizations: No  . Attends Archivist Meetings: Never  . Marital Status: Married  Human resources officer Violence: Not At Risk  . Fear of Current or Ex-Partner: No  . Emotionally Abused: No  . Physically Abused: No  . Sexually Abused: No    FAMILY HISTORY: Family History  Problem Relation Age of Onset  . Other Mother   . Lung cancer Father   . Hypertension Son   . Anesthesia problems Neg Hx   . Hypotension Neg Hx   . Malignant hyperthermia Neg Hx   . Pseudochol deficiency Neg Hx     ALLERGIES:  is allergic to carbamazepine and sulfa  antibiotics.  MEDICATIONS:  Current Outpatient Medications  Medication Sig Dispense Refill  . aspirin EC 81 MG tablet Take 81 mg by mouth at bedtime.     Marland Kitchen atenolol (TENORMIN) 50 MG tablet Take 25 mg by mouth 2 (two) times daily.    . cloNIDine (CATAPRES) 0.3 MG tablet Take 0.15 mg by mouth 2 (two) times daily.    Marland Kitchen diltiazem (CARDIZEM CD) 180 MG 24 hr capsule Take 180 mg by mouth daily.     Marland Kitchen esomeprazole (NEXIUM) 40 MG capsule Take 1 capsule (40 mg total) by mouth every morning. 30 capsule 11  . furosemide (LASIX) 20 MG tablet   12  . hydroxyurea (HYDREA) 500 MG capsule Take 1 capsule (500 mg total) by mouth daily. May take with food to minimize GI side effects. 90 capsule 3  . letrozole (FEMARA) 2.5 MG tablet Take 1 tablet by mouth once daily 90 tablet 0  . losartan (COZAAR) 100 MG tablet Take 100 mg by mouth every evening.     . Simethicone (GAS RELIEF PO) Take 1 tablet by mouth daily.    . simvastatin (ZOCOR) 10 MG tablet Take 10 mg by mouth every evening.     Marland Kitchen spironolactone (ALDACTONE) 25 MG tablet Take 25 mg by mouth every morning.    Marland Kitchen albuterol (PROVENTIL HFA;VENTOLIN HFA) 108 (90 Base) MCG/ACT inhaler Inhale 2 puffs into the lungs every 6 (six) hours as needed for wheezing or shortness of breath. Reported on 02/07/2016 (Patient not taking: Reported on 01/19/2021) 1 Inhaler 0   No current facility-administered medications for this visit.    REVIEW OF SYSTEMS:   Review of Systems  Constitutional: Positive for appetite change (75%) and fatigue (50%).  Gastrointestinal: Negative for diarrhea, nausea and vomiting.  Endocrine: Negative for hot flashes.  Musculoskeletal: Positive for myalgias (cramping in thighs). Negative for arthralgias.  Skin: Negative for itching.  Neurological: Negative for numbness.  Psychiatric/Behavioral: Positive for sleep disturbance.  All other systems reviewed and are negative.   PHYSICAL EXAMINATION: ECOG PERFORMANCE STATUS: 1 - Symptomatic but  completely ambulatory  Vitals:   01/19/21 1343  BP: (!) 120/49  Pulse: (!) 58  Resp: 18  Temp: 98 F (36.7 C)  SpO2: 92%   Filed Weights   01/19/21 1343  Weight: 238 lb 8.6 oz (108.2 kg)   Physical Exam Vitals reviewed.  Constitutional:      Appearance: Normal appearance. She is obese.  Comments: Using rollator  Cardiovascular:     Rate and Rhythm: Normal rate and regular rhythm.     Pulses: Normal pulses.     Heart sounds: Normal heart sounds.  Pulmonary:     Effort: Pulmonary effort is normal.     Breath sounds: Normal breath sounds.  Chest:  Breasts:     Right: Normal. No swelling, bleeding, inverted nipple, mass, nipple discharge, skin change, tenderness, axillary adenopathy or supraclavicular adenopathy.     Left: Skin change (lumpectomy scar well healed) present. No swelling, bleeding, inverted nipple, mass, nipple discharge, tenderness, axillary adenopathy or supraclavicular adenopathy.    Abdominal:     Palpations: Abdomen is soft. There is no hepatomegaly, splenomegaly or mass.     Tenderness: There is no abdominal tenderness.     Hernia: No hernia is present.  Musculoskeletal:     Right lower leg: Edema (chronic lymphedema) present.     Left lower leg: Edema (chronic lymphedema) present.  Lymphadenopathy:     Cervical: No cervical adenopathy.     Upper Body:     Right upper body: No supraclavicular, axillary or pectoral adenopathy.     Left upper body: No supraclavicular, axillary or pectoral adenopathy.     Lower Body: No right inguinal adenopathy. No left inguinal adenopathy.  Neurological:     General: No focal deficit present.     Mental Status: She is alert and oriented to person, place, and time.  Psychiatric:        Mood and Affect: Mood normal.        Behavior: Behavior normal.      LABORATORY DATA:  I have reviewed the data as listed No results found for this or any previous visit (from the past 2160 hour(s)).  RADIOGRAPHIC STUDIES: I  have personally reviewed the radiological reports and agreed with the findings in the report. Intravitreal Injection, Pharmacologic Agent - OS - Left Eye  Result Date: 01/18/2021 Time Out 01/18/2021. 3:51 PM. Confirmed correct patient, procedure, site, and patient consented. Anesthesia Topical anesthesia was used. Anesthetic medications included Akten 3.5%. Procedure Preparation included Ofloxacin , 10% betadine to eyelids, 5% betadine to ocular surface. A 30 gauge needle was used. Injection: 2.5 mg Bevacizumab (AVASTIN) 2.30m/0.1mL SOSY   NDC:: 34193-790-24 Lot:: [id number]  Route: Intravitreal, Site: Left Eye Post-op Post injection exam found visual acuity of at least counting fingers. The patient tolerated the procedure well. There were no complications. The patient received written and verbal post procedure care education. Post injection medications were not given.   OCT, Retina - OU - Both Eyes  Result Date: 01/18/2021 Right Eye Quality was good. Scan locations included subfoveal. Central Foveal Thickness: 255. Progression has been stable. Findings include no SRF, no IRF. Left Eye Quality was good. Scan locations included subfoveal. Central Foveal Thickness: 253. Progression has been stable. Notes OS today at 6 weeks post injection we will repeat injection today and examination again in 6 weeks OD today now 3 months post last injection stable no sign of CNVM  OCT, Retina - OU - Both Eyes  Result Date: 01/03/2021 Right Eye Quality was good. Scan locations included subfoveal. Central Foveal Thickness: 253. Progression has been stable. Findings include no SRF, no IRF. Left Eye Quality was good. Scan locations included subfoveal. Central Foveal Thickness: 242. Progression has improved. Notes OD now at 13 weeks post fall follow-up injection Avastin. For region superotemporal to the nerve, no signs of active disease near the macula will observe  ASSESSMENT:  1.  Malignant neoplasm of upper-outer quadrant  of left breast: -Left breast biopsy on 05/29/2017 of the upper outer quadrant mass-IDC, grade 1/2.  ER 5%, PR 0%, Ki-67 15%, HER-2 negative. -Left breast lumpectomy on 07/10/2017, IDC, grade 2, 1.3 cm, margins negative, 1/2 lymph nodes positive, pT1c, PN 1A -She was evaluated by Dr. Lindi Adie and was last seen in 2020. -MammaPrint-low risk luminal A, average 10-year risk of recurrence untreated 10%. -Adjuvant XRT from 9068 2018-1 08/2018. -Letrozole 1 mg daily for 5 to 7 years started on 11/15/2017.  2.  Essential thrombocytosis: -No vasomotor symptoms or prior history of thrombosis. -Hydroxyurea 100 mg daily started on 10/01/2018.  3.  Social/family history: -She was recently widowed in February 2022.  She is living independently.  She works part-time for 6 hours a day, office job.  She is non-smoker. -Father had lung cancer.    PLAN:  1.  T1CN1A left breast cancer: -She is tolerating letrozole without any major hot flashes. -She reports some cramping in her legs on and off. -Reviewed mammogram from 05/20/2020, BI-RADS Category 3. -Physical exam today did not reveal any palpable masses or adenopathy. -Reviewed labs which showed normal LFTs. -She will be seen back in 4 months for follow-up.  2.  Essential thrombocytosis: -She is tolerating hydroxyurea 1 tablet daily. -Her hematocrit today is elevated at 47.8.  Platelet count is 580, up from 484 previously. -Will increase hydroxyurea to 2 tablets on Monday, Wednesday, Friday and 1 tablet rest of the week. -We will plan to repeat labs in 4 months.  3.  Vitamin D deficiency: -Vitamin D level is low at 12.26. -We will start her on vitamin D 50,000 units weekly.   All questions were answered. The patient knows to call the clinic with any problems, questions or concerns.   Derek Jack, MD 01/19/21 2:19 PM  [facility] [phone]   I, Milinda Antis, am acting as a scribe for Dr. Sanda Linger.  I,  Derek Jack MD, have reviewed the above documentation for accuracy and completeness, and I agree with the above.

## 2021-01-19 NOTE — Patient Instructions (Signed)
[place] at [facility] Discharge Instructions  You were seen and examined today by Dr. Delton Coombes. Dr. Delton Coombes is both a medical oncologist and a hematologist, this means that he specializes both in blood disorders and management of cancer diagnoses with medications. Dr. Delton Coombes dicussed your past medical history, family history of cancers/blood disorders and the events that led to you being here today.  You were referred here to establish ongoing care given your history of breast cancer as well as because of elevated platelets. Platelets are the clotting factor of your blood. This will most likely not cause any ongoing issues, continue your hydrea, but Dr. Delton Coombes has recommended additional lab work to see what your platelets are now. Dr. Delton Coombes has also recommended checking your vitamin D level, this is because the medication you are on for your breast cancer can weaken your bones. Dr. Delton Coombes has also recommended a repeat bone density scan. Also continue regular mammogram.  Follow-up as scheduled.   Thank you for choosing [facility] at [facility] to provide your oncology and hematology care.  To afford each patient quality time with our provider, please arrive at least 15 minutes before your scheduled appointment time.   If you have a lab appointment with the [facility] please come in thru the Main Entrance and check in at the main information desk.  You need to re-schedule your appointment should you arrive 10 or more minutes late.  We strive to give you quality time with our providers, and arriving late affects you and other patients whose appointments are after yours.  Also, if you no show three or more times for appointments you may be dismissed from the clinic at the providers discretion.     Again, thank you for choosing [facility].  Our hope is that these requests will decrease the amount of time that you wait  before being seen by our physicians.       _____________________________________________________________  Should you have questions after your visit to [facility], please contact our office at [phone] and follow the prompts.  Our office hours are 8:00 a.m. and 4:30 p.m. Monday - Friday.  Please note that voicemails left after 4:00 p.m. may not be returned until the following business day.  We are closed weekends and major holidays.  You do have access to a nurse 24-7, just call the main number to the clinic [phone] and do not press any options, hold on the line and a nurse will answer the phone.    For prescription refill requests, have your pharmacy contact our office and allow 72 hours.    Due to Covid, you will need to wear a mask upon entering the hospital. If you do not have a mask, a mask will be given to you at the Main Entrance upon arrival. For doctor visits, patients may have 1 support person age 104 or older with them. For treatment visits, patients can not have anyone with them due to social distancing guidelines and our immunocompromised population.

## 2021-01-19 NOTE — Progress Notes (Signed)
I met with the patient today during her initial visit with Dr. Delton Coombes. I introduced myself and explained my role in the patient's care. I provided my contact information and encouraged the patient to call with questions or concerns.

## 2021-01-20 ENCOUNTER — Encounter (HOSPITAL_COMMUNITY): Payer: Self-pay

## 2021-01-20 NOTE — Progress Notes (Signed)
Notification received from Dr. Delton Coombes to instruct the patient to increase Hydrea to two tablets on Monday, Wednesday and Friday while remaining on 1 tablet every other day as well as to instruct the patient on new prescription for Vit D 50000 units weekly. I have called the patient and discussed medication changes. Patient verbalized understanding

## 2021-01-24 ENCOUNTER — Telehealth (HOSPITAL_COMMUNITY): Payer: Self-pay

## 2021-01-24 NOTE — Telephone Encounter (Signed)
This nurse spoke with patient today who was concerned that she may have taken the wrong dose of Vitamin D.  This nurse clarified that Dr. Delton Coombes has ordered 50,000 units per week.  Patient verified that is what she is taking.  No further questions and concerns at this time.

## 2021-01-31 DIAGNOSIS — J449 Chronic obstructive pulmonary disease, unspecified: Secondary | ICD-10-CM | POA: Diagnosis not present

## 2021-01-31 DIAGNOSIS — Z96652 Presence of left artificial knee joint: Secondary | ICD-10-CM | POA: Diagnosis not present

## 2021-01-31 DIAGNOSIS — J9601 Acute respiratory failure with hypoxia: Secondary | ICD-10-CM | POA: Diagnosis not present

## 2021-02-23 ENCOUNTER — Other Ambulatory Visit: Payer: Self-pay | Admitting: Hematology and Oncology

## 2021-03-03 DIAGNOSIS — J449 Chronic obstructive pulmonary disease, unspecified: Secondary | ICD-10-CM | POA: Diagnosis not present

## 2021-03-03 DIAGNOSIS — J9601 Acute respiratory failure with hypoxia: Secondary | ICD-10-CM | POA: Diagnosis not present

## 2021-03-03 DIAGNOSIS — Z96652 Presence of left artificial knee joint: Secondary | ICD-10-CM | POA: Diagnosis not present

## 2021-03-10 ENCOUNTER — Encounter (INDEPENDENT_AMBULATORY_CARE_PROVIDER_SITE_OTHER): Payer: Self-pay | Admitting: Ophthalmology

## 2021-03-10 ENCOUNTER — Other Ambulatory Visit: Payer: Self-pay

## 2021-03-10 ENCOUNTER — Ambulatory Visit (INDEPENDENT_AMBULATORY_CARE_PROVIDER_SITE_OTHER): Payer: Medicare Other | Admitting: Ophthalmology

## 2021-03-10 DIAGNOSIS — H353212 Exudative age-related macular degeneration, right eye, with inactive choroidal neovascularization: Secondary | ICD-10-CM

## 2021-03-10 DIAGNOSIS — H353221 Exudative age-related macular degeneration, left eye, with active choroidal neovascularization: Secondary | ICD-10-CM

## 2021-03-10 MED ORDER — BEVACIZUMAB 2.5 MG/0.1ML IZ SOSY
2.5000 mg | PREFILLED_SYRINGE | INTRAVITREAL | Status: AC | PRN
  Administered 2021-03-10: 2.5 mg via INTRAVITREAL

## 2021-03-10 NOTE — Assessment & Plan Note (Signed)
No signs of recurrent CNVM OD 

## 2021-03-10 NOTE — Assessment & Plan Note (Signed)
Small amount of subretinal fluid remains nasal to the fovea at 6-week interval.  We will repeat injection today

## 2021-03-10 NOTE — Progress Notes (Signed)
03/10/2021     CHIEF COMPLAINT Patient presents for Retina Follow Up (7 Wk F/U OS, poss Avastin OS//Pt denies noticeable changes to [place] OU since last visit. Pt denies ocular pain, flashes of light, or floaters OU. //)   HISTORY OF PRESENT ILLNESS: [name] is a 80 y.o. female who presents to the clinic today for:   HPI    Retina Follow Up    Diagnosis: Wet AMD   Laterality: left eye   Onset: 7 weeks ago   Severity: mild   Duration: 7 weeks   Course: stable   Comments: 7 Wk F/U OS, poss Avastin OS  Pt denies noticeable changes to [place] OU since last visit. Pt denies ocular pain, flashes of light, or floaters OU.          Last edited by Rockie Neighbours, [place] on 03/10/2021  3:11 PM. (History)      Referring physician: [name], MD [address] [place],  [place] [address]  HISTORICAL INFORMATION:   Selected notes from the [facility]: No current outpatient medications on file. (Ophthalmic Drugs)   No current facility-administered medications for this visit. (Ophthalmic Drugs)   Current Outpatient Medications (Other)  Medication Sig  . albuterol (PROVENTIL HFA;VENTOLIN HFA) 108 (90 Base) MCG/ACT inhaler Inhale 2 puffs into the lungs every 6 (six) hours as needed for wheezing or shortness of breath. Reported on 02/07/2016 (Patient not taking: Reported on 01/19/2021)  . aspirin EC 81 MG tablet Take 81 mg by mouth at bedtime.   Marland Kitchen atenolol (TENORMIN) 50 MG tablet Take 25 mg by mouth 2 (two) times daily.  . cloNIDine (CATAPRES) 0.3 MG tablet Take 0.15 mg by mouth 2 (two) times daily.  Marland Kitchen diltiazem (CARDIZEM CD) 180 MG 24 hr capsule Take 180 mg by mouth daily.   . ergocalciferol (VITAMIN D2) 1.25 MG (50000 UT) capsule Take 1 capsule (50,000 Units total) by mouth once a week.  . esomeprazole (NEXIUM) 40 MG capsule Take 1 capsule (40 mg total) by mouth every morning.  . furosemide (LASIX) 20 MG tablet   . hydroxyurea (HYDREA) 500 MG  capsule Take 2 tablets on Monday, Wednesday and Friday and 1 tablet daily rest of the week.  May take with food to minimize GI side effects.  Marland Kitchen letrozole (FEMARA) 2.5 MG tablet Take 1 tablet by mouth once daily  . losartan (COZAAR) 100 MG tablet Take 100 mg by mouth every evening.   . Simethicone (GAS RELIEF PO) Take 1 tablet by mouth daily.  . simvastatin (ZOCOR) 10 MG tablet Take 10 mg by mouth every evening.   Marland Kitchen spironolactone (ALDACTONE) 25 MG tablet Take 25 mg by mouth every morning.   No current facility-administered medications for this visit. (Other)      REVIEW OF SYSTEMS:    ALLERGIES Allergies  Allergen Reactions  . Carbamazepine Hives and Other (See Comments)    headache  . Sulfa Antibiotics     Rash    PAST MEDICAL HISTORY Past Medical History:  Diagnosis Date  . Anemia 04/02/2015  . Arthritis   . Atypical glandular cells on vaginal Papanicolaou smear 04/12/2016   Well get colpo and biopsy with JVF  . Bronchitis, chronic obstructive ([place]) since 2000's  . Cancer [facility])    dx with endometrial cancer   . Dysrhythmia   . GERD (gastroesophageal reflux disease)   . Gout   . Heart murmur   .  Hiatal hernia   . Hiatal hernia 11/19/2012  . History of endometrial cancer 04/06/2016   Sp hysterectomy 2012  . Hypertension   . Ischemic colitis ([place])    03/2015  . Myeloproliferative disorder, JAK-2 positive 01/05/2014  . Obesity   . Obesity, morbid (more than 100 lbs over ideal weight or BMI > 40) (HCC) 08/2011   Ht 5'7", wt 280 lb  . Pneumonia 09/2014  . Polycythemia vera(238.4) 01/18/2014  . PONV (postoperative nausea and vomiting)    post-op hypoxic resp failure 09/2011 and require Bi-Pap (possible r/t - pressure pulm edema vs asp PNA, COPD exacerbation, obesity hypoventilation  . Pulmonary hypertension ([place])   . Shortness of breath    unable to lay flat due to dyspnea  . Vaginal Pap smear, abnormal    Past Surgical History:  Procedure Laterality Date  . ABDOMINAL  HYSTERECTOMY  09/12/2011   Procedure: HYSTERECTOMY ABDOMINAL;  Surgeon: Janie Morning, MD;  [facility];  Service: Gynecology;  Laterality: N/A;  Total Abdominal Hysterectomy, Bilateral Salpingo Oophorectomy  . BREAST LUMPECTOMY Left 07/10/2017   LEFT BREAST LUMPECTOMY WITH RADIOACTIVE SEED AND SENTINEL LYMPH NODE BIOPSY   . BREAST LUMPECTOMY WITH RADIOACTIVE SEED AND SENTINEL LYMPH NODE BIOPSY Left 07/10/2017   Procedure: LEFT BREAST LUMPECTOMY WITH RADIOACTIVE SEED AND SENTINEL LYMPH NODE BIOPSY;  Surgeon: Rolm Bookbinder, MD;  Location: [facility];  Service: General;  Laterality: Left;  . COLONOSCOPY  07/05/2012   Procedure: COLONOSCOPY;  Surgeon: Rogene Houston, MD;  [facility];  Service: Endoscopy;  Laterality: N/A;  730  . EYE SURGERY     bilateral 8 - 12 yrs ago  . FLEXIBLE SIGMOIDOSCOPY N/A 04/03/2015   Procedure: FLEXIBLE SIGMOIDOSCOPY;  Surgeon: Rogene Houston, MD;  [facility];  Service: Endoscopy;  Laterality: N/A;  . HYSTEROSCOPY WITH D & C  08/15/2011   Procedure: DILATATION AND CURETTAGE (D&C) /HYSTEROSCOPY;  Surgeon: Jonnie Kind, MD;  Location: [facility];  Service: Gynecology;  Laterality: N/A;  With Suction Curette  . JOINT REPLACEMENT     left knee replaced 2014  . KNEE ARTHROPLASTY Left 03/15/2015   Procedure: COMPUTER ASSISTED TOTAL KNEE ARTHROPLASTY;  Surgeon: Marybelle Killings, MD;  Location: [facility];  Service: Orthopedics;  Laterality: Left;  . SALPINGOOPHORECTOMY  09/12/2011   Procedure: SALPINGO OOPHERECTOMY;  Surgeon: Janie Morning, MD;  [facility];  Service: Gynecology;  Laterality: Bilateral;    FAMILY HISTORY Family History  Problem Relation Age of Onset  . Other Mother   . Lung cancer Father   . Hypertension Son   . Anesthesia problems Neg Hx   . Hypotension Neg Hx   . Malignant hyperthermia Neg Hx   . Pseudochol deficiency Neg Hx     SOCIAL HISTORY Social History   Tobacco Use  . Smoking status: Never Smoker  . Smokeless  tobacco: Never Used  Vaping Use  . Vaping Use: Never used  Substance Use Topics  . Alcohol use: No  . Drug use: No         OPHTHALMIC EXAM: Base Eye Exam    Visual Acuity (ETDRS)      Right Left   Dist [place] 20/30 +2 20/40 -2   Dist ph [place] 20/25 +2 NI       Tonometry (Tonopen, 3:16 PM)      Right Left   Pressure 13 17       Pupils      Pupils Dark Light Shape React  APD   Right PERRL 3.5 2.5 Round Brisk None   Left PERRL 3.5 2.5 Round Brisk None       Visual Fields (Counting fingers)      Left Right    Full Full       Extraocular Movement      Right Left    Full Full       Neuro/Psych    Oriented x3: Yes   Mood/Affect: Normal       Dilation    Left eye: 1.0% Mydriacyl, 2.5% Phenylephrine @ 3:16 PM        Slit Lamp and Fundus Exam    External Exam      Right Left   External Normal Normal       Slit Lamp Exam      Right Left   Lids/Lashes Normal Normal   Conjunctiva/Sclera White and quiet White and quiet   Cornea Clear Clear   Anterior Chamber Deep and quiet Deep and quiet   Iris Round and reactive Round and reactive   Lens Posterior chamber intraocular lens Posterior chamber intraocular lens   Anterior Vitreous Normal Normal       Fundus Exam      Right Left   Posterior Vitreous  Posterior vitreous detachment   Disc  Normal   C/D Ratio  0.25   Macula  no macular thickening, , Retinal pigment epithelial mottling, Hard drusen, no exudates, Pigmented atrophy   Vessels  Normal   Periphery  Normal          IMAGING AND PROCEDURES  Imaging and Procedures for 03/10/21  OCT, Retina - OU - Both Eyes       Right Eye Quality was good. Scan locations included subfoveal. Central Foveal Thickness: 254. Progression has been stable. Findings include no SRF, no IRF.   Left Eye Quality was good. Scan locations included subfoveal. Central Foveal Thickness: 247. Progression has improved. Findings include no IRF, subretinal fluid.   Notes OS today at  6 weeks post injection we will repeat injection today and examination again in 6 weeks  OD today now 3 months post last injection stable no sign of CNVM       Intravitreal Injection, Pharmacologic Agent - OS - Left Eye       Time Out 03/10/2021. 4:01 PM. Confirmed correct patient, procedure, site, and patient consented.   Anesthesia Topical anesthesia was used. Anesthetic medications included [place] 3.5%.   Procedure Preparation included Ofloxacin , 10% betadine to eyelids, 5% betadine to ocular surface. A 30 gauge needle was used.   Injection:  2.5 mg Bevacizumab (AVASTIN) 2.5mg /0.11mL SOSY   NDC: O3169984, [id number]   Route: Intravitreal, Site: Left Eye  Post-op Post injection exam found visual acuity of at least counting fingers. The patient tolerated the procedure well. There were no complications. The patient received written and verbal post procedure care education. Post injection medications were not given.                 ASSESSMENT/PLAN:  Exudative age-related macular degeneration of right eye with inactive choroidal neovascularization (HCC) No signs of recurrent CNVM OD  Exudative age-related macular degeneration of left eye with active choroidal neovascularization (HCC) Small amount of subretinal fluid remains nasal to the fovea at 6-week interval.  We will repeat injection today       ICD-10-CM   1. Exudative age-related macular degeneration of left eye with active choroidal neovascularization ([place])  H35.3221 OCT, Retina - OU -  Both Eyes    Intravitreal Injection, Pharmacologic Agent - OS - Left Eye    bevacizumab (AVASTIN) SOSY 2.5 mg  2. Exudative age-related macular degeneration of right eye with inactive choroidal neovascularization ([place])  H35.3212     1.  OS improved overall, yet still active with a tiny bit of subretinal fluid nasal to the fovea, repeat injection intravitreal Avastin today and again reevaluate 6 weeks  2.  No active disease OD  by OCT  3.  Ophthalmic Meds Ordered this visit:  Meds ordered this encounter  Medications  . bevacizumab (AVASTIN) SOSY 2.5 mg       Return in about 6 weeks (around 04/21/2021) for dilate, OS, AVASTIN OCT.  There are no Patient Instructions on file for this visit.   Explained the diagnoses, plan, and follow up with the patient and they expressed understanding.  Patient expressed understanding of the importance of proper follow up care.   Clent Demark Sher Hellinger M.D. Diseases & Surgery of the Retina and Vitreous Retina & Diabetic [place] 03/10/21     Abbreviations: M myopia (nearsighted); A astigmatism; H hyperopia (farsighted); P presbyopia; Mrx spectacle prescription;  CTL contact lenses; OD right eye; OS left eye; OU both eyes  XT exotropia; ET esotropia; PEK punctate epithelial keratitis; PEE punctate epithelial erosions; DES dry eye syndrome; MGD meibomian gland dysfunction; ATs artificial tears; PFAT's preservative free artificial tears; [place] nuclear sclerotic cataract; PSC posterior subcapsular cataract; ERM epi-retinal membrane; PVD posterior vitreous detachment; RD retinal detachment; DM diabetes mellitus; DR diabetic retinopathy; NPDR non-proliferative diabetic retinopathy; PDR proliferative diabetic retinopathy; CSME clinically significant macular edema; DME diabetic macular edema; dbh dot blot hemorrhages; CWS cotton wool spot; POAG primary open angle glaucoma; C/D cup-to-disc ratio; HVF humphrey visual field; GVF goldmann visual field; OCT optical coherence tomography; IOP intraocular pressure; BRVO Branch retinal vein occlusion; CRVO central retinal vein occlusion; CRAO central retinal artery occlusion; BRAO branch retinal artery occlusion; RT retinal tear; SB scleral buckle; PPV pars plana vitrectomy; VH Vitreous hemorrhage; PRP panretinal laser photocoagulation; IVK intravitreal kenalog; VMT vitreomacular traction; MH Macular hole;  NVD neovascularization of the disc; NVE  neovascularization elsewhere; AREDS age related eye disease study; ARMD age related macular degeneration; POAG primary open angle glaucoma; EBMD epithelial/anterior basement membrane dystrophy; ACIOL anterior chamber intraocular lens; IOL intraocular lens; PCIOL posterior chamber intraocular lens; Phaco/IOL phacoemulsification with intraocular lens placement; [place] photorefractive keratectomy; LASIK laser assisted in situ keratomileusis; HTN hypertension; DM diabetes mellitus; COPD chronic obstructive pulmonary disease

## 2021-03-21 ENCOUNTER — Other Ambulatory Visit: Payer: Self-pay | Admitting: Hematology and Oncology

## 2021-03-24 DIAGNOSIS — D473 Essential (hemorrhagic) thrombocythemia: Secondary | ICD-10-CM | POA: Diagnosis not present

## 2021-03-24 DIAGNOSIS — Z79899 Other long term (current) drug therapy: Secondary | ICD-10-CM | POA: Diagnosis not present

## 2021-03-24 DIAGNOSIS — I5032 Chronic diastolic (congestive) heart failure: Secondary | ICD-10-CM | POA: Diagnosis not present

## 2021-03-24 DIAGNOSIS — I10 Essential (primary) hypertension: Secondary | ICD-10-CM | POA: Diagnosis not present

## 2021-03-31 DIAGNOSIS — E876 Hypokalemia: Secondary | ICD-10-CM | POA: Diagnosis not present

## 2021-03-31 DIAGNOSIS — I10 Essential (primary) hypertension: Secondary | ICD-10-CM | POA: Diagnosis not present

## 2021-03-31 DIAGNOSIS — I5032 Chronic diastolic (congestive) heart failure: Secondary | ICD-10-CM | POA: Diagnosis not present

## 2021-04-02 DIAGNOSIS — Z96652 Presence of left artificial knee joint: Secondary | ICD-10-CM | POA: Diagnosis not present

## 2021-04-02 DIAGNOSIS — J9601 Acute respiratory failure with hypoxia: Secondary | ICD-10-CM | POA: Diagnosis not present

## 2021-04-02 DIAGNOSIS — J449 Chronic obstructive pulmonary disease, unspecified: Secondary | ICD-10-CM | POA: Diagnosis not present

## 2021-04-17 ENCOUNTER — Other Ambulatory Visit: Payer: Self-pay | Admitting: Hematology and Oncology

## 2021-04-19 ENCOUNTER — Ambulatory Visit (INDEPENDENT_AMBULATORY_CARE_PROVIDER_SITE_OTHER): Payer: Medicare Other

## 2021-04-19 ENCOUNTER — Encounter: Payer: Self-pay | Admitting: Emergency Medicine

## 2021-04-19 ENCOUNTER — Ambulatory Visit
Admission: EM | Admit: 2021-04-19 | Discharge: 2021-04-19 | Disposition: A | Discharge status: Home / Self Care | Payer: Medicare Other | Attending: Family Medicine | Admitting: Family Medicine

## 2021-04-19 DIAGNOSIS — R0602 Shortness of breath: Secondary | ICD-10-CM

## 2021-04-19 DIAGNOSIS — I10 Essential (primary) hypertension: Secondary | ICD-10-CM

## 2021-04-19 DIAGNOSIS — K449 Diaphragmatic hernia without obstruction or gangrene: Secondary | ICD-10-CM | POA: Diagnosis not present

## 2021-04-19 DIAGNOSIS — R059 Cough, unspecified: Secondary | ICD-10-CM

## 2021-04-19 DIAGNOSIS — J189 Pneumonia, unspecified organism: Secondary | ICD-10-CM

## 2021-04-19 MED ORDER — LEVOFLOXACIN 750 MG PO TABS: 750.0000 mg | ORAL_TABLET | Freq: Every day | ORAL | 0 refills | Status: DC

## 2021-04-19 MED ORDER — BENZONATATE 100 MG PO CAPS: ORAL_CAPSULE | ORAL | 0 refills | Status: DC

## 2021-04-19 NOTE — ED Triage Notes (Signed)
Heaviness on chest and oxygen levels were going up and down at home. Runny nose and cough that started last night.   Pt's s/s started this past Friday night.  Started taking carvedilol approx x 1 week ago, stopped taking on Monday and symptoms got a little better.  Pt reports she has had PNE in the past and she felt like this.  Neg at home covid test on Saturday.

## 2021-04-19 NOTE — ED Provider Notes (Signed)
[place]   [id number] 04/19/21 Arrival Time: 0800  ASSESSMENT & PLAN:  1. SOB (shortness of breath)   2. Pneumonia of left upper lobe due to infectious organism     ECG: NSR. RBBB present on ECG dated 11/10/2017. No STEMI. CXR: L upper haziness worrisome for PNA.  SpO2 92-94% on RA; she reports this is her baseline. No respiratory distress.  Begin: Meds ordered this encounter  Medications   levofloxacin (LEVAQUIN) 750 MG tablet    Sig: Take 1 tablet (750 mg total) by mouth daily.    Dispense:  7 tablet    Refill:  0    Follow-up Information     [facility] EMERGENCY DEPARTMENT.   Specialty: Emergency Medicine Why: If symptoms worsen in any way. Contact information: [address] [phone] [place] [phone]        Schedule an appointment as soon as possible for a visit  with [name], MD.   Specialty: Internal Medicine Contact information: [address] [place] [address] [phone]                  Reviewed expectations re: course of current medical issues. Questions answered. Outlined signs and symptoms indicating need for more acute intervention. Understanding verbalized. After Visit Summary given.   SUBJECTIVE: History from: patient and caregiver. [name] is a 80 y.o. female who reports heaviness of chest and oxygen levels fluctuating at home via home pulse ox. Does not use O2 at home. Feeling SOB at time. No current CP. Runny nose and cough mainly; beginning 4-5 d ago. Started taking carvedilol approx x 1 week ago, stopped taking on Monday and symptoms got a little better. No LE edema. Wt stable. Pt reports she has had PNA in the past and she felt like this. Neg at home covid test on Saturday. Ambulatory with a walker. Denies: fever. Normal PO intake without n/v/d. Increased blood pressure noted today. She reports no orthostatic dizziness or lightheadedness, no orthopnea or  paroxysmal nocturnal dyspnea, and no palpitations.  OBJECTIVE:  Vitals:   04/19/21 0816  BP: (!) 171/76  Pulse: 83  Resp: 19  Temp: 98.4 F (36.9 C)  TempSrc: Oral  SpO2: 90%    General appearance: alert; no distress Eyes: PERRLA; EOMI; conjunctiva normal HENT: [place]; AT; with nasal congestion Neck: supple  Lungs: speaks full sentences without difficulty; unlabored; CTAB without wheezing Extremities: baseline edema Skin: warm and dry Neurologic: normal gait with walker Psychological: alert and cooperative; normal mood and affect  Labs reviewed this visit: Results for orders placed or performed in visit on 01/19/21  Vitamin D 25 hydroxy  Result Value Ref Range   Vit D, 25-Hydroxy 12.26 (L) 30 - 100 ng/mL  Lactate dehydrogenase  Result Value Ref Range   LDH 169 98 - 192 U/L  Comprehensive metabolic panel  Result Value Ref Range   Sodium 139 135 - 145 mmol/L   Potassium 4.9 3.5 - 5.1 mmol/L   Chloride 100 98 - 111 mmol/L   CO2 29 22 - 32 mmol/L   Glucose, Bld 95 70 - 99 mg/dL   BUN 24 (H) 8 - 23 mg/dL   Creatinine, Ser 0.67 0.44 - 1.00 mg/dL   Calcium 9.1 8.9 - 10.3 mg/dL   Total Protein 7.6 6.5 - 8.1 g/dL   Albumin 3.9 3.5 - 5.0 g/dL   AST 18 15 - 41 U/L   ALT 11 0 - 44 U/L  Alkaline Phosphatase 80 38 - 126 U/L   Total Bilirubin 0.7 0.3 - 1.2 mg/dL   GFR, Estimated >60 >60 mL/min   Anion gap 10 5 - 15  CBC with Differential  Result Value Ref Range   WBC 9.2 4.0 - 10.5 K/uL   RBC 4.49 3.87 - 5.11 MIL/uL   Hemoglobin 15.2 (H) 12.0 - 15.0 g/dL   HCT 47.8 (H) 36.0 - 46.0 %   MCV 106.5 (H) 80.0 - 100.0 fL   MCH 33.9 26.0 - 34.0 pg   MCHC 31.8 30.0 - 36.0 g/dL   RDW 14.2 11.5 - 15.5 %   Platelets 580 (H) 150 - 400 K/uL   nRBC 0.0 0.0 - 0.2 %   Neutrophils Relative % 68 %   Neutro Abs 6.2 1.7 - 7.7 K/uL   Lymphocytes Relative 19 %   Lymphs Abs 1.7 0.7 - 4.0 K/uL   Monocytes Relative 8 %   Monocytes Absolute 0.7 0.1 - 1.0 K/uL   Eosinophils Relative 4 %    Eosinophils Absolute 0.4 0.0 - 0.5 K/uL   Basophils Relative 1 %   Basophils Absolute 0.1 0.0 - 0.1 K/uL   Immature Granulocytes 0 %   Abs Immature Granulocytes 0.02 0.00 - 0.07 K/uL   Labs Reviewed - No data to display  Imaging: DG Chest 2 View  Result Date: 04/19/2021 CLINICAL DATA:  80 year old female with cough and shortness of breath. EXAM: CHEST - 2 VIEW COMPARISON:  Portable chest 11/10/2017 and earlier. FINDINGS: PA and lateral views. Chronic small to moderate gastric hiatal hernia and elevation of the right hemidiaphragm are not significantly changed since 2019. Other mediastinal contours are within normal limits. Visualized tracheal air column is within normal limits. No pneumothorax, pulmonary edema, pleural effusion or consolidation. But there is streaky asymmetric perihilar opacity on the left. Visualized tracheal air column is within normal limits. No acute osseous abnormality identified. Chronic upper abdominal surgical clips. Negative visible bowel gas pattern. IMPRESSION: 1. Streaky left perihilar opacity is nonspecific, but suspicious for bronchopneumonia. No pleural effusion. 2. Chronic gastric hiatal hernia and elevated right hemidiaphragm. Electronically Signed   By: Genevie Ann M.D.   On: 04/19/2021 08:34     Allergies  Allergen Reactions   Carbamazepine Hives and Other (See Comments)    headache   Sulfa Antibiotics     Rash    Past Medical History:  Diagnosis Date   Anemia 04/02/2015   Arthritis    Atypical glandular cells on vaginal Papanicolaou smear 04/12/2016   Well get colpo and biopsy with JVF   Bronchitis, chronic obstructive ([place]) since 2000's   Cancer ([place])    dx with endometrial cancer    Dysrhythmia    GERD (gastroesophageal reflux disease)    Gout    Heart murmur    Hiatal hernia    Hiatal hernia 11/19/2012   History of endometrial cancer 04/06/2016   Sp hysterectomy 2012   Hypertension    Ischemic colitis ([place])    03/2015   Myeloproliferative  disorder, JAK-2 positive 01/05/2014   Obesity    Obesity, morbid (more than 100 lbs over ideal weight or BMI > 40) ([place]) 08/2011   Ht 5'7", wt 280 lb   Pneumonia 09/2014   Polycythemia vera(238.4) 01/18/2014   PONV (postoperative nausea and vomiting)    post-op hypoxic resp failure 09/2011 and require Bi-Pap (possible r/t - pressure pulm edema vs asp PNA, COPD exacerbation, obesity hypoventilation   Pulmonary hypertension ([place])  Shortness of breath    unable to lay flat due to dyspnea   Vaginal Pap smear, abnormal    Social History   Socioeconomic History   Marital status: Married    Spouse name: Not on file   Number of children: Not on file   Years of education: Not on file   Highest education level: Not on file  Occupational History   Not on file  Tobacco Use   Smoking status: Never   Smokeless tobacco: Never  Vaping Use   Vaping Use: Never used  Substance and Sexual Activity   Alcohol use: No   Drug use: No   Sexual activity: Never    Birth control/protection: Surgical    Comment: hysterectomy  Other Topics Concern   Not on file  Social History Narrative   Not on file   Social Determinants of Health   Financial Resource Strain: Low Risk    Difficulty of Paying Living Expenses: Not hard at all  Food Insecurity: No Food Insecurity   Worried About Charity fundraiser in the Last Year: Never true   Ran Out of Food in the Last Year: Never true  Transportation Needs: No Transportation Needs   Lack of Transportation (Medical): No   Lack of Transportation (Non-Medical): No  Physical Activity: Insufficiently Active   Days of Exercise per Week: 3 days   Minutes of Exercise per Session: 30 min  Stress: No Stress Concern Present   Feeling of Stress : Only a little  Social Connections: Moderately Integrated   Frequency of Communication with Friends and Family: More than three times a week   Frequency of Social Gatherings with Friends and Family: More than three times a  week   Attends Religious Services: More than 4 times per year   Active Member of Clubs or Organizations: No   Attends Archivist Meetings: Never   Marital Status: Married  Human resources officer Violence: Not At Risk   Fear of Current or Ex-Partner: No   Emotionally Abused: No   Physically Abused: No   Sexually Abused: No   Family History  Problem Relation Age of Onset   Other Mother    Lung cancer Father    Hypertension Son    Anesthesia problems Neg Hx    Hypotension Neg Hx    Malignant hyperthermia Neg Hx    Pseudochol deficiency Neg Hx    Past Surgical History:  Procedure Laterality Date   ABDOMINAL HYSTERECTOMY  09/12/2011   Procedure: HYSTERECTOMY ABDOMINAL;  Surgeon: Janie Morning, MD;  [facility];  Service: Gynecology;  Laterality: N/A;  Total Abdominal Hysterectomy, Bilateral Salpingo Oophorectomy   BREAST LUMPECTOMY Left 07/10/2017   LEFT BREAST LUMPECTOMY WITH RADIOACTIVE SEED AND SENTINEL LYMPH NODE BIOPSY    BREAST LUMPECTOMY WITH RADIOACTIVE SEED AND SENTINEL LYMPH NODE BIOPSY Left 07/10/2017   Procedure: LEFT BREAST LUMPECTOMY WITH RADIOACTIVE SEED AND SENTINEL LYMPH NODE BIOPSY;  Surgeon: Rolm Bookbinder, MD;  Location: [facility];  Service: General;  Laterality: Left;   COLONOSCOPY  07/05/2012   Procedure: COLONOSCOPY;  Surgeon: Rogene Houston, MD;  [facility];  Service: Endoscopy;  Laterality: N/A;  730   EYE SURGERY     bilateral 8 - 12 yrs ago   FLEXIBLE SIGMOIDOSCOPY N/A 04/03/2015   Procedure: FLEXIBLE SIGMOIDOSCOPY;  Surgeon: Rogene Houston, MD;  [facility];  Service: Endoscopy;  Laterality: N/A;   HYSTEROSCOPY WITH D & C  08/15/2011   Procedure: DILATATION  AND CURETTAGE (D&C) /HYSTEROSCOPY;  Surgeon: Jonnie Kind, MD;  [facility];  Service: Gynecology;  Laterality: N/A;  With Suction Curette   JOINT REPLACEMENT     left knee replaced 2014   KNEE ARTHROPLASTY Left 03/15/2015   Procedure: COMPUTER ASSISTED TOTAL KNEE  ARTHROPLASTY;  Surgeon: Marybelle Killings, MD;  Location: [facility];  Service: Orthopedics;  Laterality: Left;   SALPINGOOPHORECTOMY  09/12/2011   Procedure: SALPINGO OOPHERECTOMY;  Surgeon: Janie Morning, MD;  [facility];  Service: Gynecology;  Laterality: Bilateral;     Vanessa Kick, MD 04/19/21 [phone]

## 2021-04-19 NOTE — Discharge Instructions (Addendum)
Your blood pressure was noted to be elevated during your visit today. If you are currently taking medication for high blood pressure, please ensure you are taking this as directed. If you do not have a history of high blood pressure and your blood pressure remains persistently elevated, you may need to begin taking a medication at some point. You may return here within the next few days to recheck if unable to see your primary care provider or if you do not have a one.  BP (!) 171/76 (BP Location: Right Arm)   Pulse 83   Temp 98.4 F (36.9 C) (Oral)   Resp 19   SpO2 90%   BP Readings from Last 3 Encounters:  04/19/21 (!) 171/76  01/19/21 (!) 120/49  11/03/19 131/67

## 2021-04-25 ENCOUNTER — Other Ambulatory Visit: Payer: Self-pay | Admitting: Hematology and Oncology

## 2021-04-25 ENCOUNTER — Emergency Department (HOSPITAL_COMMUNITY)
Admission: EM | Admit: 2021-04-25 | Discharge: 2021-04-25 | Disposition: A | Discharge status: Home / Self Care | Payer: Medicare Other | Attending: Emergency Medicine | Admitting: Emergency Medicine

## 2021-04-25 ENCOUNTER — Other Ambulatory Visit: Payer: Self-pay

## 2021-04-25 ENCOUNTER — Encounter (INDEPENDENT_AMBULATORY_CARE_PROVIDER_SITE_OTHER): Payer: Medicare Other | Admitting: Ophthalmology

## 2021-04-25 ENCOUNTER — Emergency Department (HOSPITAL_COMMUNITY): Payer: Medicare Other

## 2021-04-25 ENCOUNTER — Encounter (HOSPITAL_COMMUNITY): Payer: Self-pay | Admitting: Radiology

## 2021-04-25 DIAGNOSIS — R609 Edema, unspecified: Secondary | ICD-10-CM | POA: Insufficient documentation

## 2021-04-25 DIAGNOSIS — R0602 Shortness of breath: Secondary | ICD-10-CM | POA: Diagnosis not present

## 2021-04-25 DIAGNOSIS — R4020 Unspecified coma: Secondary | ICD-10-CM | POA: Diagnosis not present

## 2021-04-25 DIAGNOSIS — Z853 Personal history of malignant neoplasm of breast: Secondary | ICD-10-CM | POA: Diagnosis not present

## 2021-04-25 DIAGNOSIS — Z7982 Long term (current) use of aspirin: Secondary | ICD-10-CM | POA: Insufficient documentation

## 2021-04-25 DIAGNOSIS — R011 Cardiac murmur, unspecified: Secondary | ICD-10-CM | POA: Diagnosis not present

## 2021-04-25 DIAGNOSIS — Z96652 Presence of left artificial knee joint: Secondary | ICD-10-CM | POA: Diagnosis not present

## 2021-04-25 DIAGNOSIS — R55 Syncope and collapse: Secondary | ICD-10-CM | POA: Diagnosis not present

## 2021-04-25 DIAGNOSIS — Z743 Need for continuous supervision: Secondary | ICD-10-CM | POA: Diagnosis not present

## 2021-04-25 DIAGNOSIS — Z79899 Other long term (current) drug therapy: Secondary | ICD-10-CM | POA: Insufficient documentation

## 2021-04-25 DIAGNOSIS — I517 Cardiomegaly: Secondary | ICD-10-CM | POA: Diagnosis not present

## 2021-04-25 DIAGNOSIS — R0689 Other abnormalities of breathing: Secondary | ICD-10-CM | POA: Diagnosis not present

## 2021-04-25 DIAGNOSIS — K449 Diaphragmatic hernia without obstruction or gangrene: Secondary | ICD-10-CM | POA: Diagnosis not present

## 2021-04-25 DIAGNOSIS — Z8542 Personal history of malignant neoplasm of other parts of uterus: Secondary | ICD-10-CM | POA: Insufficient documentation

## 2021-04-25 DIAGNOSIS — E876 Hypokalemia: Secondary | ICD-10-CM | POA: Insufficient documentation

## 2021-04-25 DIAGNOSIS — I4510 Unspecified right bundle-branch block: Secondary | ICD-10-CM | POA: Diagnosis not present

## 2021-04-25 DIAGNOSIS — R404 Transient alteration of awareness: Secondary | ICD-10-CM | POA: Diagnosis not present

## 2021-04-25 DIAGNOSIS — I10 Essential (primary) hypertension: Secondary | ICD-10-CM | POA: Insufficient documentation

## 2021-04-25 DIAGNOSIS — R0600 Dyspnea, unspecified: Secondary | ICD-10-CM | POA: Diagnosis not present

## 2021-04-25 LAB — CBC WITH DIFFERENTIAL/PLATELET
Abs Immature Granulocytes: 0.04 10*3/uL (ref 0.00–0.07)
Basophils Absolute: 0.1 10*3/uL (ref 0.0–0.1)
Basophils Relative: 1 %
Eosinophils Absolute: 0.3 10*3/uL (ref 0.0–0.5)
Eosinophils Relative: 3 %
HCT: 41 % (ref 36.0–46.0)
Hemoglobin: 13.6 g/dL (ref 12.0–15.0)
Immature Granulocytes: 1 %
Lymphocytes Relative: 16 %
Lymphs Abs: 1.4 10*3/uL (ref 0.7–4.0)
MCH: 34.8 pg — ABNORMAL HIGH (ref 26.0–34.0)
MCHC: 33.2 g/dL (ref 30.0–36.0)
MCV: 104.9 fL — ABNORMAL HIGH (ref 80.0–100.0)
Monocytes Absolute: 0.7 10*3/uL (ref 0.1–1.0)
Monocytes Relative: 7 %
Neutro Abs: 6.4 10*3/uL (ref 1.7–7.7)
Neutrophils Relative %: 72 %
Platelets: 510 10*3/uL — ABNORMAL HIGH (ref 150–400)
RBC: 3.91 MIL/uL (ref 3.87–5.11)
RDW: 14.2 % (ref 11.5–15.5)
WBC: 8.9 10*3/uL (ref 4.0–10.5)
nRBC: 0 % (ref 0.0–0.2)

## 2021-04-25 LAB — BLOOD GAS, VENOUS
Acid-Base Excess: 7.4 mmol/L — ABNORMAL HIGH (ref 0.0–2.0)
Bicarbonate: 29.7 mmol/L — ABNORMAL HIGH (ref 20.0–28.0)
FIO2: 21
O2 Saturation: 74.9 %
Patient temperature: 36.4
pCO2, Ven: 50.5 mmHg (ref 44.0–60.0)
pH, Ven: 7.417 (ref 7.250–7.430)
pO2, Ven: 39.9 mmHg (ref 32.0–45.0)

## 2021-04-25 LAB — TROPONIN I (HIGH SENSITIVITY)
Troponin I (High Sensitivity): 4 ng/L (ref <18)
Troponin I (High Sensitivity): 5 ng/L (ref <18)

## 2021-04-25 LAB — BRAIN NATRIURETIC PEPTIDE: B Natriuretic Peptide: 57 pg/mL (ref 0.0–100.0)

## 2021-04-25 LAB — BASIC METABOLIC PANEL
Anion gap: 8 (ref 5–15)
BUN: 17 mg/dL (ref 8–23)
CO2: 30 mmol/L (ref 22–32)
Calcium: 8.8 mg/dL — ABNORMAL LOW (ref 8.9–10.3)
Chloride: 99 mmol/L (ref 98–111)
Creatinine, Ser: 0.68 mg/dL (ref 0.44–1.00)
GFR, Estimated: >60 mL/min (ref >60)
Glucose, Bld: 146 mg/dL — ABNORMAL HIGH (ref 70–99)
Potassium: 3.4 mmol/L — ABNORMAL LOW (ref 3.5–5.1)
Sodium: 137 mmol/L (ref 135–145)

## 2021-04-25 LAB — D-DIMER, QUANTITATIVE: D-Dimer, Quant: 0.62 ug/mL-FEU — ABNORMAL HIGH (ref 0.00–0.50)

## 2021-04-25 MED ORDER — POTASSIUM CHLORIDE 20 MEQ PO PACK
40.0000 meq | PACK | Freq: Once | ORAL | Status: AC
  Administered 2021-04-25: 40 meq via ORAL
  Filled 2021-04-25: qty 2

## 2021-04-25 MED ORDER — IOHEXOL 350 MG/ML SOLN
75.0000 mL | Freq: Once | INTRAVENOUS | Status: AC | PRN
  Administered 2021-04-25: 75 mL via INTRAVENOUS

## 2021-04-25 MED ORDER — ONDANSETRON HCL 4 MG/2ML IJ SOLN
4.0000 mg | Freq: Once | INTRAMUSCULAR | Status: DC
  Filled 2021-04-25: qty 2

## 2021-04-25 NOTE — ED Notes (Signed)
Dr. Regenia Skeeter at bedside during [facility] screening.

## 2021-04-25 NOTE — ED Triage Notes (Addendum)
Per EMS patient's family went to check on patient in bedroom and patient states SHOB. Patient states she started felling bad and family noticed patient went unresponsive while lying in bed. Patient has hx of sleep apnea with oxygen at night. 88% oxygen on RA. Patient placed on 2L/[place] in triage. Patient appears tired and has pinpoint pupils. CBG 161

## 2021-04-25 NOTE — ED Notes (Signed)
Patient transported to CT 

## 2021-04-25 NOTE — ED Provider Notes (Signed)
[facility] EMERGENCY DEPARTMENT Provider Note   CSN: [id number] Arrival date & time: 04/25/21  1644     History Chief Complaint  Patient presents with   Loss of Consciousness   Shortness of Breath    [name] is a 80 y.o. female.  HPI 80 year old female presents with near syncope and dyspnea.  Patient is from home and history is from patient and the nurses spoke to EMS.  Patient recently was treated for pneumonia for some chest pain and abnormal x-ray.  She has been feeling fine up until this afternoon at around 3 PM.  She was cleaning and was bent over with her head down and acutely felt lightheaded and sleepy.  She was short of breath and had some nausea without vomiting.  She has not had any chest pain or headache.  The lightheadedness seems to be significantly improved.  She does not think she passed out but family told EMS she did briefly pass out.  The patient was not using any chemicals when she was cleaning, was just picking things up.  She wears oxygen at night but not during the day.  Dyspnea feels better but she is still having some nausea.  Chronically has bilateral leg swelling that is unchanged.  Past Medical History:  Diagnosis Date   Anemia 04/02/2015   Arthritis    Atypical glandular cells on vaginal Papanicolaou smear 04/12/2016   Well get colpo and biopsy with JVF   Bronchitis, chronic obstructive ([place]) since 2000's   Cancer ([place])    dx with endometrial cancer    Dysrhythmia    GERD (gastroesophageal reflux disease)    Gout    Heart murmur    Hiatal hernia    Hiatal hernia 11/19/2012   History of endometrial cancer 04/06/2016   Sp hysterectomy 2012   Hypertension    Ischemic colitis ([place])    03/2015   Myeloproliferative disorder, JAK-2 positive 01/05/2014   Obesity    Obesity, morbid (more than 100 lbs over ideal weight or BMI > 40) ([place]) 08/2011   Ht 5'7", wt 280 lb   Pneumonia 09/2014   Polycythemia vera(238.4) 01/18/2014   PONV (postoperative nausea  and vomiting)    post-op hypoxic resp failure 09/2011 and require Bi-Pap (possible r/t - pressure pulm edema vs asp PNA, COPD exacerbation, obesity hypoventilation   Pulmonary hypertension (HCC)    Shortness of breath    unable to lay flat due to dyspnea   Vaginal Pap smear, abnormal     Patient Active Problem List   Diagnosis Date Noted   Pain in right shoulder 11/17/2020   Exudative age-related macular degeneration of right eye with inactive choroidal neovascularization ([place]) 05/04/2020   Subretinal hemorrhage of right eye 04/26/2020   Serous detachment of retinal pigment epithelium of left eye 03/22/2020   Exudative age-related macular degeneration of left eye with active choroidal neovascularization ([place]) 02/16/2020   Intermediate stage nonexudative age-related macular degeneration of both eyes 02/16/2020   Macular drusen, left 02/16/2020   Posterior vitreous detachment of right eye 02/16/2020   Body mass index 40.0-44.9, adult ([place]) 03/06/2019   Unilateral primary osteoarthritis, right knee 03/06/2019   CAP (community acquired pneumonia) 11/09/2017   Macrocytosis 07/11/2017   Respiratory complication, postoperative    Breast cancer ([place]) left 07/04/2017   Malignant neoplasm of upper-outer quadrant of left breast in female, estrogen receptor positive ([place]) 06/22/2017   Endometrial cancer, grade I ([place]) 04/20/2016   Encounter for follow-up surveillance of endometrial cancer  04/06/2016   Hematochezia 04/02/2015   Rectal bleeding 04/02/2015   Anemia 04/02/2015   Bronchitis, chronic obstructive (HCC)    Arthritis    Hypertension    Dysrhythmia    Obesity    Essential hypertension    Status post total left knee replacement 03/15/2015   Myeloproliferative disorder, JAK-2 positive 01/05/2014   Essential thrombocytosis ([place]) 12/19/2013   CMC arthritis, thumb, degenerative 06/03/2013   Bone, giant cell tumor 06/03/2013   GERD (gastroesophageal reflux disease) 11/19/2012   Hiatal  hernia 11/19/2012   Acute respiratory failure with hypoxia ([place]) 09/13/2011   Bilateral pulmonary infiltrates on chest x-ray 09/13/2011   Leukocytosis 09/13/2011   Hyperglycemia 09/13/2011   Chronic lung disease 08/16/2011    Class: Chronic   Obesity, morbid (more than 100 lbs over ideal weight or BMI > 40) ([place]) 08/07/2011   KNEE, ARTHRITIS, DEGEN./OSTEO 05/24/2010    Past Surgical History:  Procedure Laterality Date   ABDOMINAL HYSTERECTOMY  09/12/2011   Procedure: HYSTERECTOMY ABDOMINAL;  Surgeon: Janie Morning, MD;  Location: [facility];  Service: Gynecology;  Laterality: N/A;  Total Abdominal Hysterectomy, Bilateral Salpingo Oophorectomy   BREAST LUMPECTOMY Left 07/10/2017   LEFT BREAST LUMPECTOMY WITH RADIOACTIVE SEED AND SENTINEL LYMPH NODE BIOPSY    BREAST LUMPECTOMY WITH RADIOACTIVE SEED AND SENTINEL LYMPH NODE BIOPSY Left 07/10/2017   Procedure: LEFT BREAST LUMPECTOMY WITH RADIOACTIVE SEED AND SENTINEL LYMPH NODE BIOPSY;  Surgeon: Rolm Bookbinder, MD;  Location: [facility];  Service: General;  Laterality: Left;   COLONOSCOPY  07/05/2012   Procedure: COLONOSCOPY;  Surgeon: Rogene Houston, MD;  Location: [facility];  Service: Endoscopy;  Laterality: N/A;  730   EYE SURGERY     bilateral 8 - 12 yrs ago   FLEXIBLE SIGMOIDOSCOPY N/A 04/03/2015   Procedure: FLEXIBLE SIGMOIDOSCOPY;  Surgeon: Rogene Houston, MD;  [facility]: [facility];  Service: Endoscopy;  Laterality: N/A;   HYSTEROSCOPY WITH D & C  08/15/2011   Procedure: DILATATION AND CURETTAGE (D&C) /HYSTEROSCOPY;  Surgeon: Jonnie Kind, MD;  Location: [facility];  Service: Gynecology;  Laterality: N/A;  With Suction Curette   JOINT REPLACEMENT     left knee replaced 2014   KNEE ARTHROPLASTY Left 03/15/2015   Procedure: COMPUTER ASSISTED TOTAL KNEE ARTHROPLASTY;  Surgeon: Marybelle Killings, MD;  Location: [facility];  Service: Orthopedics;  Laterality: Left;   SALPINGOOPHORECTOMY  09/12/2011   Procedure: SALPINGO OOPHERECTOMY;  Surgeon: Janie Morning, MD;  Location: [facility];  Service: Gynecology;  Laterality: Bilateral;     OB History     Gravida  5   Para  5   Term      Preterm      AB  0   Living  5      SAB      IAB      Ectopic      Multiple      Live Births              Family History  Problem Relation Age of Onset   Other Mother    Lung cancer Father    Hypertension Son    Anesthesia problems Neg Hx    Hypotension Neg Hx    Malignant hyperthermia Neg Hx    Pseudochol deficiency Neg Hx     Social History   Tobacco Use   Smoking status: Never   Smokeless tobacco: Never  Vaping Use   Vaping Use: Never used  Substance Use Topics  Alcohol use: No   Drug use: No    Home Medications Prior to Admission medications   Medication Sig Start Date End Date Taking? Authorizing Provider  albuterol (PROVENTIL HFA;VENTOLIN HFA) 108 (90 Base) MCG/ACT inhaler Inhale 2 puffs into the lungs every 6 (six) hours as needed for wheezing or shortness of breath. Reported on 02/07/2016 Patient not taking: Reported on 01/19/2021 02/07/16   Patrici Ranks, MD  aspirin EC 81 MG tablet Take 81 mg by mouth at bedtime.     [provider]  benzonatate (TESSALON) 100 MG capsule Take 1 capsule by mouth every 8 (eight) hours for cough. 04/19/21   Vanessa Kick, MD  cloNIDine (CATAPRES) 0.3 MG tablet Take 0.15 mg by mouth 2 (two) times daily.    [provider]  diltiazem (CARDIZEM CD) 180 MG 24 hr capsule Take 180 mg by mouth daily.  03/05/13   [provider]  ergocalciferol (VITAMIN D2) 1.25 MG (50000 UT) capsule Take 1 capsule (50,000 Units total) by mouth once a week. 01/19/21   Derek Jack, MD  esomeprazole (NEXIUM) 40 MG capsule Take 1 capsule (40 mg total) by mouth every morning. 09/15/14   Setzer, Rona Ravens, NP  furosemide (LASIX) 20 MG tablet  02/09/18   [provider]  hydroxyurea (HYDREA) 500 MG capsule Take 2 tablets on Monday, Wednesday and Friday and 1 tablet daily  rest of the week.  May take with food to minimize GI side effects. 01/19/21   Derek Jack, MD  letrozole (FEMARA) 2.5 MG tablet TAKE 1 TABLET BY MOUTH ONCE DAILY . APPOINTMENT REQUIRED FOR FUTURE REFILLS 03/21/21   Nicholas Lose, MD  levofloxacin (LEVAQUIN) 750 MG tablet Take 1 tablet (750 mg total) by mouth daily. 04/19/21   Vanessa Kick, MD  losartan (COZAAR) 100 MG tablet Take 100 mg by mouth every evening.  02/25/13   [provider]  Simethicone (GAS RELIEF PO) Take 1 tablet by mouth daily.    [provider]  simvastatin (ZOCOR) 10 MG tablet Take 10 mg by mouth every evening.     [provider]    Allergies    Carbamazepine and Sulfa antibiotics  Review of Systems   Review of Systems  Constitutional:  Negative for fever.  Respiratory:  Positive for shortness of breath. Negative for cough.   Cardiovascular:  Positive for leg swelling. Negative for chest pain.  Gastrointestinal:  Positive for nausea. Negative for abdominal pain and vomiting.  Neurological:  Positive for syncope and light-headedness. Negative for weakness and headaches.  All other systems reviewed and are negative.  Physical Exam Updated Vital Signs BP 137/66 (BP Location: Right Arm)   Pulse 72   Temp 98.1 F (36.7 C) (Oral)   Resp 18   Ht 5\' 4"  (1.626 m)   Wt 108 kg   SpO2 94%   BMI 40.87 kg/m   Physical Exam Vitals and nursing note reviewed.  Constitutional:      General: She is not in acute distress.    Appearance: She is well-developed. She is obese. She is not ill-appearing or diaphoretic.  HENT:     Head: Normocephalic and atraumatic.     Right Ear: External ear normal.     Left Ear: External ear normal.     Nose: Nose normal.  Eyes:     General:        Right eye: No discharge.        Left eye: No discharge.  Extraocular Movements: Extraocular movements intact.     Comments: Miotic pupils bilaterally  Cardiovascular:     Rate and Rhythm: Normal rate and  regular rhythm.     Heart sounds: Murmur heard.  Pulmonary:     Effort: Pulmonary effort is normal.     Breath sounds: Examination of the right-lower field reveals rales. Rales (mild) present.  Abdominal:     Palpations: Abdomen is soft.     Tenderness: There is no abdominal tenderness.  Musculoskeletal:     Right lower leg: Edema present.     Left lower leg: Edema present.     Comments: Non pitting edema to feet/ankles  Skin:    General: Skin is warm and dry.  Neurological:     Mental Status: She is alert.     Comments: CN 3-12 grossly intact. 5/5 strength in all 4 extremities. Grossly normal sensation. Normal finger to nose.   Psychiatric:        Mood and Affect: Mood is not anxious.    ED Results / Procedures / Treatments   Labs (all labs ordered are listed, but only abnormal results are displayed) Labs Reviewed  BASIC METABOLIC PANEL - Abnormal; Notable for the following components:      Result Value   Potassium 3.4 (*)    Glucose, Bld 146 (*)    Calcium 8.8 (*)    All other components within normal limits  CBC WITH DIFFERENTIAL/PLATELET - Abnormal; Notable for the following components:   MCV 104.9 (*)    MCH 34.8 (*)    Platelets 510 (*)    All other components within normal limits  D-DIMER, QUANTITATIVE - Abnormal; Notable for the following components:   D-Dimer, Quant 0.62 (*)    All other components within normal limits  BLOOD GAS, VENOUS - Abnormal; Notable for the following components:   Bicarbonate 29.7 (*)    Acid-Base Excess 7.4 (*)    All other components within normal limits  BRAIN NATRIURETIC PEPTIDE  TROPONIN I (HIGH SENSITIVITY)  TROPONIN I (HIGH SENSITIVITY)    EKG EKG Interpretation  Date/Time:  Monday April 25 2021 17:20:47 EDT Ventricular Rate:  60 PR Interval:  173 QRS Duration: 161 QT Interval:  481 QTC Calculation: 481 R Axis:   -1 Text Interpretation: Sinus rhythm Right bundle branch block no significant change since 2019 Confirmed by  Sherwood Gambler [phone]) on 04/25/2021 6:02:21 PM  Radiology DG Chest 2 View  Result Date: 04/25/2021 CLINICAL DATA:  Dyspnea, near syncope, shortness of breath EXAM: CHEST - 2 VIEW COMPARISON:  04/19/2021 FINDINGS: Low volume examination with diffuse bilateral interstitial pulmonary opacity, most conspicuous at the left lung base. Cardiomegaly. Disc degenerative disease of the thoracic spine. IMPRESSION: 1. Low volume examination with diffuse bilateral interstitial pulmonary opacity, most conspicuous at the left lung base. Findings likely reflect edema, appearance somewhat exaggerated by low volume technique. 2.  Cardiomegaly. Electronically Signed   By: Eddie Candle M.D.   On: 04/25/2021 17:51   CT Angio Chest PE W and/or Wo Contrast  Result Date: 04/25/2021 CLINICAL DATA:  Shortness of breath EXAM: CT ANGIOGRAPHY CHEST WITH CONTRAST TECHNIQUE: Multidetector CT imaging of the chest was performed using the standard protocol during bolus administration of intravenous contrast. Multiplanar CT image reconstructions and MIPs were obtained to evaluate the vascular anatomy. CONTRAST:  48mL OMNIPAQUE IOHEXOL 350 MG/ML SOLN COMPARISON:  Chest x-ray from earlier in the same day, CT from 11/09/2017 FINDINGS: Cardiovascular: Thoracic aorta and its branches demonstrate atherosclerotic calcification  without aneurysmal dilatation or dissection. No cardiac enlargement is seen. Large left atrial appendage is noted. Coronary calcifications are noted. Pulmonary artery shows a normal branching pattern. No filling defect to suggest pulmonary embolism is noted. Mediastinum/Nodes: Thoracic inlet is within normal limits. No sizable hilar or mediastinal adenopathy is noted. Moderate-sized hiatal hernia is noted. Lungs/Pleura: Lungs are well aerated bilaterally with patchy areas of scarring stable in appearance from the prior exam. No sizable effusion or pneumothorax is noted. No sizable parenchymal nodule is seen. No significant  edema is noted. Upper Abdomen: Visualized upper abdomen shows right renal cyst. No other focal abnormality is noted. Musculoskeletal: Degenerative changes of the thoracic spine are noted. Review of the MIP images confirms the above findings. IMPRESSION: No evidence of pulmonary emboli. Mild scarring in the lungs bilaterally. No acute abnormality noted. Aortic Atherosclerosis (ICD10-I70.0). Electronically Signed   By: Inez Catalina M.D.   On: 04/25/2021 19:02    Procedures Procedures   Medications Ordered in ED Medications  ondansetron (ZOFRAN) injection 4 mg (4 mg Intravenous Not Given 04/25/21 1837)  potassium chloride (KLOR-CON) packet 40 mEq (40 mEq Oral Given 04/25/21 1829)  iohexol (OMNIPAQUE) 350 MG/ML injection 75 mL (75 mLs Intravenous Contrast Given 04/25/21 1852)    ED Course  I have reviewed the triage vital signs and the nursing notes.  Pertinent labs & imaging results that were available during my care of the patient were reviewed by me and considered in my medical decision making (see chart for details).    MDM Rules/Calculators/A&P                          Unclear cause of the patient's syncope today.  Could have been change in position as she was bending down when it first occurred.  Otherwise, she has some borderline O2 sats but it seems like this is probably a chronic finding.  Work-up including for heart attack and PE are negative.  I did discuss with patient and family that this could have potentially been an arrhythmia problem and we discussed potential observation admission.  However patient and family declined and want to go home.  We discussed this is probably reasonable but at the same time if any symptoms arise then she needs to return to the ER and needs a follow-up closely with PCP for outpatient syncope work-up. Final Clinical Impression(s) / ED Diagnoses Final diagnoses:  Syncope, unspecified syncope type  Hypokalemia    Rx / DC Orders ED Discharge Orders      None        Sherwood Gambler, MD 04/25/21 2113

## 2021-04-25 NOTE — Discharge Instructions (Addendum)
If you develop severe headache, new or worsening dizziness, lightheadedness, passing out, or any other new/concerning symptoms return to the ER for evaluation.  It is important a follow-up with your primary care physician.

## 2021-04-27 ENCOUNTER — Other Ambulatory Visit: Payer: Self-pay

## 2021-04-27 ENCOUNTER — Ambulatory Visit (INDEPENDENT_AMBULATORY_CARE_PROVIDER_SITE_OTHER): Payer: Medicare Other | Admitting: Orthopaedic Surgery

## 2021-04-27 ENCOUNTER — Encounter: Payer: Self-pay | Admitting: Orthopaedic Surgery

## 2021-04-27 DIAGNOSIS — G8929 Other chronic pain: Secondary | ICD-10-CM | POA: Diagnosis not present

## 2021-04-27 DIAGNOSIS — M25511 Pain in right shoulder: Secondary | ICD-10-CM | POA: Diagnosis not present

## 2021-04-27 DIAGNOSIS — M25561 Pain in right knee: Secondary | ICD-10-CM

## 2021-04-27 NOTE — Progress Notes (Signed)
PROCEDURE NOTE:  The patient request injection, verbal consent was obtained.  The right shoulder was prepped appropriately after time out was performed.   Sterile technique was observed and injection of 1 cc of Celestone 6mg  with several cc's of plain xylocaine. Anesthesia was provided by ethyl chloride and a 20-gauge needle was used to inject the shoulder area. A posterior approach was used.  The injection was tolerated well.  A band aid dressing was applied.  The patient was advised to apply ice later today and tomorrow to the injection sight as needed.  PROCEDURE NOTE:  The patient requests injections of the right knee , verbal consent was obtained.  The right knee was prepped appropriately after time out was performed.   Sterile technique was observed and injection of 1 cc of Celestone 6mg  with several cc's of plain xylocaine. Anesthesia was provided by ethyl chloride and a 20-gauge needle was used to inject the knee area. The injection was tolerated well.  A band aid dressing was applied.  The patient was advised to apply ice later today and tomorrow to the injection sight as needed.  Call if any problem.  Precautions discussed.  Return in three months.  Electronically Signed Sanjuana Kava, MD 6/22/20229:46 AM

## 2021-04-28 ENCOUNTER — Other Ambulatory Visit (HOSPITAL_COMMUNITY): Payer: Self-pay

## 2021-04-28 DIAGNOSIS — D473 Essential (hemorrhagic) thrombocythemia: Secondary | ICD-10-CM

## 2021-04-28 MED ORDER — HYDROXYUREA 500 MG PO CAPS: ORAL_CAPSULE | ORAL | 3 refills | Status: DC

## 2021-04-28 MED ORDER — LETROZOLE 2.5 MG PO TABS: ORAL_TABLET | ORAL | 0 refills | Status: DC

## 2021-04-28 NOTE — Telephone Encounter (Signed)
See Dr. Tomie China last note from 01/19/21. Refill placed for Hydrea and Letrozole. Patient has follow up scheduled for 05/30/21 with Dr. Delton Coombes.

## 2021-05-03 ENCOUNTER — Ambulatory Visit (INDEPENDENT_AMBULATORY_CARE_PROVIDER_SITE_OTHER): Payer: Medicare Other | Admitting: Ophthalmology

## 2021-05-03 ENCOUNTER — Encounter (INDEPENDENT_AMBULATORY_CARE_PROVIDER_SITE_OTHER): Payer: Self-pay | Admitting: Ophthalmology

## 2021-05-03 ENCOUNTER — Other Ambulatory Visit: Payer: Self-pay

## 2021-05-03 DIAGNOSIS — R55 Syncope and collapse: Secondary | ICD-10-CM | POA: Diagnosis not present

## 2021-05-03 DIAGNOSIS — R070 Pain in throat: Secondary | ICD-10-CM | POA: Diagnosis not present

## 2021-05-03 DIAGNOSIS — R112 Nausea with vomiting, unspecified: Secondary | ICD-10-CM | POA: Diagnosis not present

## 2021-05-03 DIAGNOSIS — I5032 Chronic diastolic (congestive) heart failure: Secondary | ICD-10-CM | POA: Diagnosis not present

## 2021-05-03 DIAGNOSIS — H353221 Exudative age-related macular degeneration, left eye, with active choroidal neovascularization: Secondary | ICD-10-CM | POA: Diagnosis not present

## 2021-05-03 DIAGNOSIS — I700 Atherosclerosis of aorta: Secondary | ICD-10-CM | POA: Diagnosis not present

## 2021-05-03 DIAGNOSIS — J9601 Acute respiratory failure with hypoxia: Secondary | ICD-10-CM | POA: Diagnosis not present

## 2021-05-03 DIAGNOSIS — R1110 Vomiting, unspecified: Secondary | ICD-10-CM | POA: Insufficient documentation

## 2021-05-03 DIAGNOSIS — J449 Chronic obstructive pulmonary disease, unspecified: Secondary | ICD-10-CM | POA: Diagnosis not present

## 2021-05-03 DIAGNOSIS — Z96652 Presence of left artificial knee joint: Secondary | ICD-10-CM | POA: Diagnosis not present

## 2021-05-03 NOTE — Assessment & Plan Note (Signed)
Patient encouraged to return home now until feeling better and seek medical attention if condition continues to worsen

## 2021-05-03 NOTE — Progress Notes (Signed)
05/03/2021     CHIEF COMPLAINT Patient presents for Retina Follow Up (7 weeks fu OS and Avastin OS/Pt states VA OU stable since last visit. Pt denies FOL, floaters, or ocular pain OU. /)   HISTORY OF PRESENT ILLNESS: [name] is a 80 y.o. female who presents to the clinic today for:   HPI     Retina Follow Up           Diagnosis: Wet AMD   Laterality: left eye   Onset: 7 weeks ago   Severity: mild   Duration: 7 weeks   Course: stable   Comments: 7 weeks fu OS and Avastin OS Pt states VA OU stable since last visit. Pt denies FOL, floaters, or ocular pain OU.           Comments   as patient was brought into the room after dilating drops were delivered, and vision had been checked, After acuity and intraocular pressures taken and slit-lamp done, by technician, patient developed spontaneous nausea and vomiting prior to examination by physician.  And nursing staff escorted the patient out to waiting family car      Last edited by Hurman Horn, MD on 05/03/2021  2:32 PM.      Referring physician: [name], MD [address] [place],  [place] [address]  HISTORICAL INFORMATION:   Selected notes from the [facility]: No current outpatient medications on file. (Ophthalmic Drugs)   No current facility-administered medications for this visit. (Ophthalmic Drugs)   Current Outpatient Medications (Other)  Medication Sig   albuterol (PROVENTIL HFA;VENTOLIN HFA) 108 (90 Base) MCG/ACT inhaler Inhale 2 puffs into the lungs every 6 (six) hours as needed for wheezing or shortness of breath. Reported on 02/07/2016   aspirin EC 81 MG tablet Take 81 mg by mouth at bedtime.    benzonatate (TESSALON) 100 MG capsule Take 1 capsule by mouth every 8 (eight) hours for cough.   cloNIDine (CATAPRES) 0.3 MG tablet Take 0.15 mg by mouth 2 (two) times daily.   diltiazem (CARDIZEM CD) 180 MG 24 hr capsule Take 180 mg by mouth daily.     ergocalciferol (VITAMIN D2) 1.25 MG (50000 UT) capsule Take 1 capsule (50,000 Units total) by mouth once a week.   esomeprazole (NEXIUM) 40 MG capsule Take 1 capsule (40 mg total) by mouth every morning.   furosemide (LASIX) 20 MG tablet    hydroxyurea (HYDREA) 500 MG capsule Take 2 tablets on Monday, Wednesday and Friday and 1 tablet daily rest of the week.  May take with food to minimize GI side effects.   letrozole (FEMARA) 2.5 MG tablet TAKE 1 TABLET BY MOUTH ONCE DAILY . APPOINTMENT REQUIRED FOR FUTURE REFILLS   levofloxacin (LEVAQUIN) 750 MG tablet Take 1 tablet (750 mg total) by mouth daily.   losartan (COZAAR) 100 MG tablet Take 100 mg by mouth every evening.    Simethicone (GAS RELIEF PO) Take 1 tablet by mouth daily.   simvastatin (ZOCOR) 10 MG tablet Take 10 mg by mouth every evening.    No current facility-administered medications for this visit. (Other)      REVIEW OF SYSTEMS:    ALLERGIES Allergies  Allergen Reactions   Carbamazepine Hives and Other (See Comments)    headache   Sulfa Antibiotics     Rash    PAST MEDICAL HISTORY Past Medical History:  Diagnosis Date   Anemia 04/02/2015  Arthritis    Atypical glandular cells on vaginal Papanicolaou smear 04/12/2016   Well get colpo and biopsy with JVF   Bronchitis, chronic obstructive ([place]) since 2000's   Cancer ([place])    dx with endometrial cancer    Dysrhythmia    GERD (gastroesophageal reflux disease)    Gout    Heart murmur    Hiatal hernia    Hiatal hernia 11/19/2012   History of endometrial cancer 04/06/2016   Sp hysterectomy 2012   Hypertension    Ischemic colitis ([place])    03/2015   Myeloproliferative disorder, JAK-2 positive 01/05/2014   Obesity    Obesity, morbid (more than 100 lbs over ideal weight or BMI > 40) ([place]) 08/2011   Ht 5'7", wt 280 lb   Pneumonia 09/2014   Polycythemia vera(238.4) 01/18/2014   PONV (postoperative nausea and vomiting)    post-op hypoxic resp failure 09/2011 and require  Bi-Pap (possible r/t - pressure pulm edema vs asp PNA, COPD exacerbation, obesity hypoventilation   Pulmonary hypertension ([place])    Shortness of breath    unable to lay flat due to dyspnea   Vaginal Pap smear, abnormal    Past Surgical History:  Procedure Laterality Date   ABDOMINAL HYSTERECTOMY  09/12/2011   Procedure: HYSTERECTOMY ABDOMINAL;  Surgeon: Janie Morning, MD;  [facility];  Service: Gynecology;  Laterality: N/A;  Total Abdominal Hysterectomy, Bilateral Salpingo Oophorectomy   BREAST LUMPECTOMY Left 07/10/2017   LEFT BREAST LUMPECTOMY WITH RADIOACTIVE SEED AND SENTINEL LYMPH NODE BIOPSY    BREAST LUMPECTOMY WITH RADIOACTIVE SEED AND SENTINEL LYMPH NODE BIOPSY Left 07/10/2017   Procedure: LEFT BREAST LUMPECTOMY WITH RADIOACTIVE SEED AND SENTINEL LYMPH NODE BIOPSY;  Surgeon: Rolm Bookbinder, MD;  Location: [facility];  Service: General;  Laterality: Left;   COLONOSCOPY  07/05/2012   Procedure: COLONOSCOPY;  Surgeon: Rogene Houston, MD;  [facility];  Service: Endoscopy;  Laterality: N/A;  730   EYE SURGERY     bilateral 8 - 12 yrs ago   FLEXIBLE SIGMOIDOSCOPY N/A 04/03/2015   Procedure: FLEXIBLE SIGMOIDOSCOPY;  Surgeon: Rogene Houston, MD;  [facility];  Service: Endoscopy;  Laterality: N/A;   HYSTEROSCOPY WITH D & C  08/15/2011   Procedure: DILATATION AND CURETTAGE (D&C) /HYSTEROSCOPY;  Surgeon: Jonnie Kind, MD;  [facility];  Service: Gynecology;  Laterality: N/A;  With Suction Curette   JOINT REPLACEMENT     left knee replaced 2014   KNEE ARTHROPLASTY Left 03/15/2015   Procedure: COMPUTER ASSISTED TOTAL KNEE ARTHROPLASTY;  Surgeon: Marybelle Killings, MD;  Location: [facility];  Service: Orthopedics;  Laterality: Left;   SALPINGOOPHORECTOMY  09/12/2011   Procedure: SALPINGO OOPHERECTOMY;  Surgeon: Janie Morning, MD;  [facility];  Service: Gynecology;  Laterality: Bilateral;    FAMILY HISTORY Family History  Problem Relation Age of Onset   Other  Mother    Lung cancer Father    Hypertension Son    Anesthesia problems Neg Hx    Hypotension Neg Hx    Malignant hyperthermia Neg Hx    Pseudochol deficiency Neg Hx     SOCIAL HISTORY Social History   Tobacco Use   Smoking status: Never   Smokeless tobacco: Never  Vaping Use   Vaping Use: Never used  Substance Use Topics   Alcohol use: No   Drug use: No         OPHTHALMIC EXAM:  Base Eye Exam     Visual Acuity (  ETDRS)       Right Left   Dist [place] 20/25 +2 20/40 +2   Dist ph [place]  NI         Tonometry (Tonopen, 1:52 PM)       Right Left   Pressure 09 11         Pupils       Pupils Dark Light Shape React APD   Right PERRL 3.5 2.5 Round Brisk None   Left PERRL 3.5 2.5 Round Brisk None         Visual Fields (Counting fingers)       Left Right    Full Full         Extraocular Movement       Right Left    Full Full         Neuro/Psych     Oriented x3: Yes   Mood/Affect: Normal         Dilation     Left eye: 1.0% Mydriacyl, 2.5% Phenylephrine @ 1:52 PM           Slit Lamp and Fundus Exam     External Exam       Right Left   External Normal Normal         Slit Lamp Exam       Right Left   Lids/Lashes Normal Normal   Conjunctiva/Sclera White and quiet White and quiet   Cornea Clear Clear   Anterior Chamber Deep and quiet Deep and quiet   Iris Round and reactive Round and reactive   Lens Posterior chamber intraocular lens Posterior chamber intraocular lens   Anterior Vitreous Normal Normal            IMAGING AND PROCEDURES  Imaging and Procedures for 05/03/21  OCT, Retina - OU - Both Eyes       Right Eye Quality was good. Scan locations included subfoveal. Central Foveal Thickness: 252. Progression has been stable. Findings include no SRF, no IRF.   Left Eye Quality was good. Scan locations included subfoveal. Central Foveal Thickness: 248. Progression has improved. Findings include no IRF, subretinal  fluid.   Notes OS today at 6 weeks post injection we planned for repeat injection today, yet patient became GI upset with vomiting prior to examination thus we will reschedule in the coming 1 to 2 weeks for examination  OD today now 3 months post last injection stable no sign of CNVM             ASSESSMENT/PLAN:  Exudative age-related macular degeneration of left eye with active choroidal neovascularization ([place]) Will need to reschedule delivery of intravitreal antivegF, Avastin OS when patient feeling well  Emesis Patient encouraged to return home now until feeling better and seek medical attention if condition continues to worsen     ICD-10-CM   1. Exudative age-related macular degeneration of left eye with active choroidal neovascularization (HCC)  H35.3221 OCT, Retina - OU - Both Eyes    2. Non-intractable vomiting with nausea, unspecified vomiting type  R11.2       1.  2.  3.  Ophthalmic Meds Ordered this visit:  No orders of the defined types were placed in this encounter.      Return for Reschedule 1 to 2 weeks, dilate, OS, AVASTIN OCT.  There are no Patient Instructions on file for this visit.   Explained the diagnoses, plan, and follow up with the patient and they expressed understanding.  Patient expressed understanding of  the importance of proper follow up care.   Clent Demark Lonna Rabold M.D. Diseases & Surgery of the Retina and Vitreous Retina & Diabetic [place] 05/03/21     Abbreviations: M myopia (nearsighted); A astigmatism; H hyperopia (farsighted); P presbyopia; Mrx spectacle prescription;  CTL contact lenses; OD right eye; OS left eye; OU both eyes  XT exotropia; ET esotropia; PEK punctate epithelial keratitis; PEE punctate epithelial erosions; DES dry eye syndrome; MGD meibomian gland dysfunction; ATs artificial tears; PFAT's preservative free artificial tears; [place] nuclear sclerotic cataract; PSC posterior subcapsular cataract; ERM epi-retinal  membrane; PVD posterior vitreous detachment; RD retinal detachment; DM diabetes mellitus; DR diabetic retinopathy; NPDR non-proliferative diabetic retinopathy; PDR proliferative diabetic retinopathy; CSME clinically significant macular edema; DME diabetic macular edema; dbh dot blot hemorrhages; CWS cotton wool spot; POAG primary open angle glaucoma; C/D cup-to-disc ratio; HVF humphrey visual field; GVF goldmann visual field; OCT optical coherence tomography; IOP intraocular pressure; BRVO Branch retinal vein occlusion; CRVO central retinal vein occlusion; CRAO central retinal artery occlusion; BRAO branch retinal artery occlusion; RT retinal tear; SB scleral buckle; PPV pars plana vitrectomy; VH Vitreous hemorrhage; PRP panretinal laser photocoagulation; IVK intravitreal kenalog; VMT vitreomacular traction; MH Macular hole;  NVD neovascularization of the disc; NVE neovascularization elsewhere; AREDS age related eye disease study; ARMD age related macular degeneration; POAG primary open angle glaucoma; EBMD epithelial/anterior basement membrane dystrophy; ACIOL anterior chamber intraocular lens; IOL intraocular lens; PCIOL posterior chamber intraocular lens; Phaco/IOL phacoemulsification with intraocular lens placement; [place] photorefractive keratectomy; LASIK laser assisted in situ keratomileusis; HTN hypertension; DM diabetes mellitus; COPD chronic obstructive pulmonary disease

## 2021-05-03 NOTE — Assessment & Plan Note (Signed)
Will need to reschedule delivery of intravitreal antivegF, Avastin OS when patient feeling well

## 2021-05-04 ENCOUNTER — Telehealth: Payer: Self-pay | Admitting: Orthopaedic Surgery

## 2021-05-04 NOTE — Telephone Encounter (Signed)
Patient called stating her right arm is hurting so bad.  Dr. Luna Glasgow put a shot in her shoulder and she thinks he went to deep or a different angle this time.   It has been hurting worse every since.  Patient can not lift her arm it is so sore and painful.  She said she is not suppose to be hurting [place] this after a injection.   She was throwing up yesterday due to the pain.    Please call her back at [phone]

## 2021-05-05 MED ORDER — HYDROCODONE-ACETAMINOPHEN 5-325 MG PO TABS: ORAL_TABLET | ORAL | 0 refills | Status: DC

## 2021-05-05 NOTE — Telephone Encounter (Signed)
Called and spoke with patient stated her arm is sore and painful. Has tried ice, heat, Advil, Aleve, patches and Tylenol with no relief. She is asking if she could get something for pain.    PATIENT USES [facility]

## 2021-05-17 ENCOUNTER — Other Ambulatory Visit: Payer: Self-pay

## 2021-05-17 ENCOUNTER — Encounter (INDEPENDENT_AMBULATORY_CARE_PROVIDER_SITE_OTHER): Payer: Self-pay | Admitting: Ophthalmology

## 2021-05-17 ENCOUNTER — Ambulatory Visit (INDEPENDENT_AMBULATORY_CARE_PROVIDER_SITE_OTHER): Payer: Medicare Other | Admitting: Ophthalmology

## 2021-05-17 DIAGNOSIS — H353212 Exudative age-related macular degeneration, right eye, with inactive choroidal neovascularization: Secondary | ICD-10-CM | POA: Diagnosis not present

## 2021-05-17 DIAGNOSIS — H353221 Exudative age-related macular degeneration, left eye, with active choroidal neovascularization: Secondary | ICD-10-CM

## 2021-05-17 MED ORDER — BEVACIZUMAB 2.5 MG/0.1ML IZ SOSY
2.5000 mg | PREFILLED_SYRINGE | INTRAVITREAL | Status: AC | PRN
  Administered 2021-05-17: 2.5 mg via INTRAVITREAL

## 2021-05-17 NOTE — Progress Notes (Signed)
05/17/2021     CHIEF COMPLAINT Patient presents for Retina Follow Up (9 week fu Avastin OS/Pt states VA OU stable since last visit. Pt denies FOL, floaters, or ocular pain OU. //)   HISTORY OF PRESENT ILLNESS: [name] is a 80 y.o. female who presents to the clinic today for:   HPI     Retina Follow Up           Diagnosis: Wet AMD   Laterality: left eye   Onset: 9 weeks ago   Severity: mild   Duration: 9 weeks   Course: stable   Comments: 9 week fu Avastin OS Pt states VA OU stable since last visit. Pt denies FOL, floaters, or ocular pain OU.          Last edited by Kendra Opitz, COA on 05/17/2021  2:56 PM.      Referring physician: [name], MD [address] [place],  [place] [address]  HISTORICAL INFORMATION:   Selected notes from the [facility]: No current outpatient medications on file. (Ophthalmic Drugs)   No current facility-administered medications for this visit. (Ophthalmic Drugs)   Current Outpatient Medications (Other)  Medication Sig   albuterol (PROVENTIL HFA;VENTOLIN HFA) 108 (90 Base) MCG/ACT inhaler Inhale 2 puffs into the lungs every 6 (six) hours as needed for wheezing or shortness of breath. Reported on 02/07/2016   aspirin EC 81 MG tablet Take 81 mg by mouth at bedtime.    benzonatate (TESSALON) 100 MG capsule Take 1 capsule by mouth every 8 (eight) hours for cough.   cloNIDine (CATAPRES) 0.3 MG tablet Take 0.15 mg by mouth 2 (two) times daily.   diltiazem (CARDIZEM CD) 180 MG 24 hr capsule Take 180 mg by mouth daily.    ergocalciferol (VITAMIN D2) 1.25 MG (50000 UT) capsule Take 1 capsule (50,000 Units total) by mouth once a week.   esomeprazole (NEXIUM) 40 MG capsule Take 1 capsule (40 mg total) by mouth every morning.   furosemide (LASIX) 20 MG tablet    HYDROcodone-acetaminophen (NORCO/VICODIN) 5-325 MG tablet One tablet every four hours for pain.   hydroxyurea (HYDREA) 500 MG  capsule Take 2 tablets on Monday, Wednesday and Friday and 1 tablet daily rest of the week.  May take with food to minimize GI side effects.   letrozole (FEMARA) 2.5 MG tablet TAKE 1 TABLET BY MOUTH ONCE DAILY . APPOINTMENT REQUIRED FOR FUTURE REFILLS   levofloxacin (LEVAQUIN) 750 MG tablet Take 1 tablet (750 mg total) by mouth daily.   losartan (COZAAR) 100 MG tablet Take 100 mg by mouth every evening.    Simethicone (GAS RELIEF PO) Take 1 tablet by mouth daily.   simvastatin (ZOCOR) 10 MG tablet Take 10 mg by mouth every evening.    No current facility-administered medications for this visit. (Other)      REVIEW OF SYSTEMS:    ALLERGIES Allergies  Allergen Reactions   Carbamazepine Hives and Other (See Comments)    headache   Sulfa Antibiotics     Rash    PAST MEDICAL HISTORY Past Medical History:  Diagnosis Date   Anemia 04/02/2015   Arthritis    Atypical glandular cells on vaginal Papanicolaou smear 04/12/2016   Well get colpo and biopsy with JVF   Bronchitis, chronic obstructive ([place]) since 2000's   Cancer ([place])    dx with endometrial cancer    Dysrhythmia    GERD (gastroesophageal reflux  disease)    Gout    Heart murmur    Hiatal hernia    Hiatal hernia 11/19/2012   History of endometrial cancer 04/06/2016   Sp hysterectomy 2012   Hypertension    Ischemic colitis ([place])    03/2015   Myeloproliferative disorder, JAK-2 positive 01/05/2014   Obesity    Obesity, morbid (more than 100 lbs over ideal weight or BMI > 40) ([place]) 08/2011   Ht 5'7", wt 280 lb   Pneumonia 09/2014   Polycythemia vera(238.4) 01/18/2014   PONV (postoperative nausea and vomiting)    post-op hypoxic resp failure 09/2011 and require Bi-Pap (possible r/t - pressure pulm edema vs asp PNA, COPD exacerbation, obesity hypoventilation   Pulmonary hypertension ([place])    Shortness of breath    unable to lay flat due to dyspnea   Vaginal Pap smear, abnormal    Past Surgical History:  Procedure Laterality  Date   ABDOMINAL HYSTERECTOMY  09/12/2011   Procedure: HYSTERECTOMY ABDOMINAL;  Surgeon: Janie Morning, MD;  [facility];  Service: Gynecology;  Laterality: N/A;  Total Abdominal Hysterectomy, Bilateral Salpingo Oophorectomy   BREAST LUMPECTOMY Left 07/10/2017   LEFT BREAST LUMPECTOMY WITH RADIOACTIVE SEED AND SENTINEL LYMPH NODE BIOPSY    BREAST LUMPECTOMY WITH RADIOACTIVE SEED AND SENTINEL LYMPH NODE BIOPSY Left 07/10/2017   Procedure: LEFT BREAST LUMPECTOMY WITH RADIOACTIVE SEED AND SENTINEL LYMPH NODE BIOPSY;  Surgeon: Rolm Bookbinder, MD;  Location: [facility];  Service: General;  Laterality: Left;   COLONOSCOPY  07/05/2012   Procedure: COLONOSCOPY;  Surgeon: Rogene Houston, MD;  [facility];  Service: Endoscopy;  Laterality: N/A;  730   EYE SURGERY     bilateral 8 - 12 yrs ago   FLEXIBLE SIGMOIDOSCOPY N/A 04/03/2015   Procedure: FLEXIBLE SIGMOIDOSCOPY;  Surgeon: Rogene Houston, MD;  [facility];  Service: Endoscopy;  Laterality: N/A;   HYSTEROSCOPY WITH D & C  08/15/2011   Procedure: DILATATION AND CURETTAGE (D&C) /HYSTEROSCOPY;  Surgeon: Jonnie Kind, MD;  [facility];  Service: Gynecology;  Laterality: N/A;  With Suction Curette   JOINT REPLACEMENT     left knee replaced 2014   KNEE ARTHROPLASTY Left 03/15/2015   Procedure: COMPUTER ASSISTED TOTAL KNEE ARTHROPLASTY;  Surgeon: Marybelle Killings, MD;  Location: [facility];  Service: Orthopedics;  Laterality: Left;   SALPINGOOPHORECTOMY  09/12/2011   Procedure: SALPINGO OOPHERECTOMY;  Surgeon: Janie Morning, MD;  [facility];  Service: Gynecology;  Laterality: Bilateral;    FAMILY HISTORY Family History  Problem Relation Age of Onset   Other Mother    Lung cancer Father    Hypertension Son    Anesthesia problems Neg Hx    Hypotension Neg Hx    Malignant hyperthermia Neg Hx    Pseudochol deficiency Neg Hx     SOCIAL HISTORY Social History   Tobacco Use   Smoking status: Never   Smokeless tobacco:  Never  Vaping Use   Vaping Use: Never used  Substance Use Topics   Alcohol use: No   Drug use: No         OPHTHALMIC EXAM:  Base Eye Exam     Visual Acuity (ETDRS)       Right Left   Dist [place] 20/25 -2 20/30 -2   Dist ph [place]  NI         Tonometry (Tonopen, 2:57 PM)       Right Left   Pressure 10 09  Pupils       Pupils   Right PERRL   Left PERRL         Visual Fields (Counting fingers)       Left Right    Full Full         Extraocular Movement       Right Left    Full Full         Neuro/Psych     Oriented x3: Yes   Mood/Affect: Normal         Dilation     Left eye: 1.0% Mydriacyl, 2.5% Phenylephrine @ 2:57 PM           Slit Lamp and Fundus Exam     External Exam       Right Left   External Normal Normal         Slit Lamp Exam       Right Left   Lids/Lashes Normal Normal   Conjunctiva/Sclera White and quiet White and quiet   Cornea Clear Clear   Anterior Chamber Deep and quiet Deep and quiet   Iris Round and reactive Round and reactive   Lens Posterior chamber intraocular lens Posterior chamber intraocular lens   Anterior Vitreous Normal Normal         Fundus Exam       Right Left   Posterior Vitreous  Posterior vitreous detachment   Disc  Normal   C/D Ratio  0.25   Macula  no macular thickening clinically, , Retinal pigment epithelial mottling, Hard drusen, no exudates, Pigmented atrophy   Vessels  Normal   Periphery  Normal            IMAGING AND PROCEDURES  Imaging and Procedures for 05/17/21  OCT, Retina - OU - Both Eyes       Right Eye Quality was good. Scan locations included subfoveal. Central Foveal Thickness: 253. Progression has been stable. Findings include no SRF, no IRF.   Left Eye Quality was good. Scan locations included subfoveal. Central Foveal Thickness: 260. Progression has improved. Findings include no IRF, subretinal fluid.   Notes OS today at 9 weeks post injection  we planned for repeat injection today, with still with subretinal fluid active but much less today at this longer interval.  Repeat injection today and maintain the current interval examination OD today now 5 months post last injection stable no sign of CNVM     Intravitreal Injection, Pharmacologic Agent - OS - Left Eye       Time Out 05/17/2021. 3:25 PM. Confirmed correct patient, procedure, site, and patient consented.   Anesthesia Anesthetic medications included [place] 3.5%.   Procedure Preparation included Tobramycin 0.3%, 10% betadine to eyelids, 5% betadine to ocular surface. A 30 gauge needle was used.   Injection: 2.5 mg bevacizumab 2.5 MG/0.1ML   Route: Intravitreal, Site: Left Eye   NDC: [phone], Lot: [id number]              ASSESSMENT/PLAN:  Exudative age-related macular degeneration of left eye with active choroidal neovascularization (HCC) Today at 9 weeks postinjection, still with less subretinal fluid.  Repeat injection Avastin today and maintain 9-week interval follow-up  Exudative age-related macular degeneration of right eye with inactive choroidal neovascularization (HCC) OD currently at 7 months after last injection for wet AMD, CNVM, no signs of recurrence we will continue to observe     ICD-10-CM   1. Exudative age-related macular degeneration of left eye with active choroidal neovascularization ([place])  H35.3221 OCT, Retina - OU - Both Eyes    Intravitreal Injection, Pharmacologic Agent - OS - Left Eye    bevacizumab (AVASTIN) SOSY 2.5 mg    2. Exudative age-related macular degeneration of right eye with inactive choroidal neovascularization ([place])  H35.3212       1.  Vastly improved macular findings left eye, 9-week interval today post treatment for wet AMD CNVM.  Still with less subretinal fluid.  Repeat injection today and examination next OS in 9 weeks  2.  3.  Ophthalmic Meds Ordered this visit:  Meds ordered this encounter  Medications    bevacizumab (AVASTIN) SOSY 2.5 mg       Return in about 9 weeks (around 07/19/2021) for dilate, OS, AVASTIN OCT.  There are no Patient Instructions on file for this visit.   Explained the diagnoses, plan, and follow up with the patient and they expressed understanding.  Patient expressed understanding of the importance of proper follow up care.   Clent Demark Jaedyn Marrufo M.D. Diseases & Surgery of the Retina and Vitreous Retina & Diabetic [place] 05/17/21     Abbreviations: M myopia (nearsighted); A astigmatism; H hyperopia (farsighted); P presbyopia; Mrx spectacle prescription;  CTL contact lenses; OD right eye; OS left eye; OU both eyes  XT exotropia; ET esotropia; PEK punctate epithelial keratitis; PEE punctate epithelial erosions; DES dry eye syndrome; MGD meibomian gland dysfunction; ATs artificial tears; PFAT's preservative free artificial tears; [place] nuclear sclerotic cataract; PSC posterior subcapsular cataract; ERM epi-retinal membrane; PVD posterior vitreous detachment; RD retinal detachment; DM diabetes mellitus; DR diabetic retinopathy; NPDR non-proliferative diabetic retinopathy; PDR proliferative diabetic retinopathy; CSME clinically significant macular edema; DME diabetic macular edema; dbh dot blot hemorrhages; CWS cotton wool spot; POAG primary open angle glaucoma; C/D cup-to-disc ratio; HVF humphrey visual field; GVF goldmann visual field; OCT optical coherence tomography; IOP intraocular pressure; BRVO Branch retinal vein occlusion; CRVO central retinal vein occlusion; CRAO central retinal artery occlusion; BRAO branch retinal artery occlusion; RT retinal tear; SB scleral buckle; PPV pars plana vitrectomy; VH Vitreous hemorrhage; PRP panretinal laser photocoagulation; IVK intravitreal kenalog; VMT vitreomacular traction; MH Macular hole;  NVD neovascularization of the disc; NVE neovascularization elsewhere; AREDS age related eye disease study; ARMD age related macular degeneration;  POAG primary open angle glaucoma; EBMD epithelial/anterior basement membrane dystrophy; ACIOL anterior chamber intraocular lens; IOL intraocular lens; PCIOL posterior chamber intraocular lens; Phaco/IOL phacoemulsification with intraocular lens placement; [place] photorefractive keratectomy; LASIK laser assisted in situ keratomileusis; HTN hypertension; DM diabetes mellitus; COPD chronic obstructive pulmonary disease

## 2021-05-17 NOTE — Assessment & Plan Note (Signed)
Today at 9 weeks postinjection, still with less subretinal fluid.  Repeat injection Avastin today and maintain 9-week interval follow-up

## 2021-05-17 NOTE — Assessment & Plan Note (Signed)
OD currently at 7 months after last injection for wet AMD, CNVM, no signs of recurrence we will continue to observe

## 2021-05-23 ENCOUNTER — Other Ambulatory Visit (HOSPITAL_COMMUNITY): Payer: Self-pay | Admitting: Hematology

## 2021-05-24 ENCOUNTER — Ambulatory Visit (HOSPITAL_COMMUNITY)
Admission: RE | Admit: 2021-05-24 | Discharge: 2021-05-24 | Disposition: A | Discharge status: Home / Self Care | Payer: Medicare Other | Source: Ambulatory Visit | Attending: Hematology | Admitting: Hematology

## 2021-05-24 ENCOUNTER — Other Ambulatory Visit: Payer: Self-pay

## 2021-05-24 ENCOUNTER — Inpatient Hospital Stay (HOSPITAL_COMMUNITY): Payer: Medicare Other | Attending: Hematology

## 2021-05-24 DIAGNOSIS — D473 Essential (hemorrhagic) thrombocythemia: Secondary | ICD-10-CM

## 2021-05-24 DIAGNOSIS — Z853 Personal history of malignant neoplasm of breast: Secondary | ICD-10-CM | POA: Diagnosis not present

## 2021-05-24 DIAGNOSIS — Z79899 Other long term (current) drug therapy: Secondary | ICD-10-CM | POA: Diagnosis not present

## 2021-05-24 DIAGNOSIS — C50412 Malignant neoplasm of upper-outer quadrant of left female breast: Secondary | ICD-10-CM

## 2021-05-24 DIAGNOSIS — Z8249 Family history of ischemic heart disease and other diseases of the circulatory system: Secondary | ICD-10-CM | POA: Diagnosis not present

## 2021-05-24 DIAGNOSIS — R922 Inconclusive mammogram: Secondary | ICD-10-CM | POA: Diagnosis not present

## 2021-05-24 DIAGNOSIS — R0602 Shortness of breath: Secondary | ICD-10-CM | POA: Insufficient documentation

## 2021-05-24 DIAGNOSIS — Z801 Family history of malignant neoplasm of trachea, bronchus and lung: Secondary | ICD-10-CM | POA: Diagnosis not present

## 2021-05-24 DIAGNOSIS — Z170 Estrogen receptor positive status [ER+]: Secondary | ICD-10-CM

## 2021-05-24 DIAGNOSIS — Z780 Asymptomatic menopausal state: Secondary | ICD-10-CM | POA: Diagnosis not present

## 2021-05-24 DIAGNOSIS — E559 Vitamin D deficiency, unspecified: Secondary | ICD-10-CM | POA: Insufficient documentation

## 2021-05-24 DIAGNOSIS — M7989 Other specified soft tissue disorders: Secondary | ICD-10-CM | POA: Insufficient documentation

## 2021-05-24 DIAGNOSIS — R5383 Other fatigue: Secondary | ICD-10-CM | POA: Insufficient documentation

## 2021-05-24 LAB — CBC WITH DIFFERENTIAL/PLATELET
Abs Immature Granulocytes: 0.02 10*3/uL (ref 0.00–0.07)
Basophils Absolute: 0.1 10*3/uL (ref 0.0–0.1)
Basophils Relative: 1 %
Eosinophils Absolute: 0.4 10*3/uL (ref 0.0–0.5)
Eosinophils Relative: 5 %
HCT: 44.6 % (ref 36.0–46.0)
Hemoglobin: 14.2 g/dL (ref 12.0–15.0)
Immature Granulocytes: 0 %
Lymphocytes Relative: 21 %
Lymphs Abs: 1.5 10*3/uL (ref 0.7–4.0)
MCH: 34.6 pg — ABNORMAL HIGH (ref 26.0–34.0)
MCHC: 31.8 g/dL (ref 30.0–36.0)
MCV: 108.8 fL — ABNORMAL HIGH (ref 80.0–100.0)
Monocytes Absolute: 0.6 10*3/uL (ref 0.1–1.0)
Monocytes Relative: 8 %
Neutro Abs: 4.6 10*3/uL (ref 1.7–7.7)
Neutrophils Relative %: 65 %
Platelets: 407 10*3/uL — ABNORMAL HIGH (ref 150–400)
RBC: 4.1 MIL/uL (ref 3.87–5.11)
RDW: 14.4 % (ref 11.5–15.5)
WBC: 7.2 10*3/uL (ref 4.0–10.5)
nRBC: 0 % (ref 0.0–0.2)

## 2021-05-24 LAB — COMPREHENSIVE METABOLIC PANEL
ALT: 12 U/L (ref 0–44)
AST: 16 U/L (ref 15–41)
Albumin: 3.8 g/dL (ref 3.5–5.0)
Alkaline Phosphatase: 75 U/L (ref 38–126)
Anion gap: 9 (ref 5–15)
BUN: 24 mg/dL — ABNORMAL HIGH (ref 8–23)
CO2: 25 mmol/L (ref 22–32)
Calcium: 8.9 mg/dL (ref 8.9–10.3)
Chloride: 102 mmol/L (ref 98–111)
Creatinine, Ser: 0.6 mg/dL (ref 0.44–1.00)
GFR, Estimated: >60 mL/min (ref >60)
Glucose, Bld: 88 mg/dL (ref 70–99)
Potassium: 4 mmol/L (ref 3.5–5.1)
Sodium: 136 mmol/L (ref 135–145)
Total Bilirubin: 0.9 mg/dL (ref 0.3–1.2)
Total Protein: 6.9 g/dL (ref 6.5–8.1)

## 2021-05-24 LAB — LACTATE DEHYDROGENASE: LDH: 148 U/L (ref 98–192)

## 2021-05-24 LAB — VITAMIN D 25 HYDROXY (VIT D DEFICIENCY, FRACTURES): Vit D, 25-Hydroxy: 70.66 ng/mL (ref 30–100)

## 2021-05-30 ENCOUNTER — Other Ambulatory Visit: Payer: Self-pay

## 2021-05-30 ENCOUNTER — Inpatient Hospital Stay (HOSPITAL_COMMUNITY): Payer: Medicare Other | Admitting: Physician Assistant

## 2021-05-30 VITALS — BP 136/59 | HR 75 | Temp 96.7°F | Resp 19 | Wt 243.5 lb

## 2021-05-30 DIAGNOSIS — Z170 Estrogen receptor positive status [ER+]: Secondary | ICD-10-CM

## 2021-05-30 DIAGNOSIS — Z79899 Other long term (current) drug therapy: Secondary | ICD-10-CM | POA: Diagnosis not present

## 2021-05-30 DIAGNOSIS — D473 Essential (hemorrhagic) thrombocythemia: Secondary | ICD-10-CM

## 2021-05-30 DIAGNOSIS — R5383 Other fatigue: Secondary | ICD-10-CM | POA: Diagnosis not present

## 2021-05-30 DIAGNOSIS — E559 Vitamin D deficiency, unspecified: Secondary | ICD-10-CM

## 2021-05-30 DIAGNOSIS — R0602 Shortness of breath: Secondary | ICD-10-CM | POA: Diagnosis not present

## 2021-05-30 DIAGNOSIS — M7989 Other specified soft tissue disorders: Secondary | ICD-10-CM | POA: Diagnosis not present

## 2021-05-30 DIAGNOSIS — Z801 Family history of malignant neoplasm of trachea, bronchus and lung: Secondary | ICD-10-CM | POA: Diagnosis not present

## 2021-05-30 DIAGNOSIS — Z8249 Family history of ischemic heart disease and other diseases of the circulatory system: Secondary | ICD-10-CM | POA: Diagnosis not present

## 2021-05-30 DIAGNOSIS — C50412 Malignant neoplasm of upper-outer quadrant of left female breast: Secondary | ICD-10-CM | POA: Diagnosis not present

## 2021-05-31 DIAGNOSIS — E559 Vitamin D deficiency, unspecified: Secondary | ICD-10-CM | POA: Insufficient documentation

## 2021-05-31 NOTE — Progress Notes (Signed)
[facility] [address], [place] [address]   Patient Care Team: [name], MD as PCP - General (Internal Medicine) Jonnie Kind, MD (Inactive) as Consulting Physician (Obstetrics and Gynecology) Janie Morning, MD as Attending Physician (Obstetrics and Gynecology) Carole Civil, MD as Consulting Physician (Orthopedic Surgery) Nicholas Lose, MD as Consulting Physician (Hematology and Oncology) Kyung Rudd, MD as Consulting Physician (Radiation Oncology) Rolm Bookbinder, MD as Consulting Physician (General Surgery) Delice Bison, Charlestine Massed, NP as Nurse Practitioner (Hematology and Oncology) Brien Mates, RN as Oncology Nurse Navigator (Oncology)  CHIEF COMPLAINTS:  History of left breast cancer and thrombocytosis  HISTORY OF PRESENTING ILLNESS:  [name] 80 y.o. female is here because of history of left breast cancer and thrombocytosis  On exam today, [name] reports that her energy levels are stable.  She uses a walker to ambulate.  Patient has a good appetite without any noticeable weight changes.  She denies any nausea, vomiting or abdominal pain.  Her bowel movements are fairly regular without any diarrhea or constipation.  She denies bruising or signs has stable shortness of breath with exertion.  She does have supplemental oxygen that she uses as needed.  She reports not using supplemental oxygen in the last 2 to 3 weeks.  Patient reports having 1 episode of chest pressure that resulted in an evaluation in urgent care on 04/19/2021.  She was diagnosed with pneumonia at that time and was treated with antibiotics which resolved her chest pressure.  Patient reports dizziness on occasion without any syncopal episodes.  He is tolerating Hydrea daily without any significant limitations.  Patient denies any fevers, chills, chest pain, cough, neuropathy, or rash.  She has no other complaints.   SUMMARY OF ONCOLOGIC HISTORY: Oncology History   Malignant neoplasm of upper-outer quadrant of left breast in female, estrogen receptor positive ([place])  07/10/2017 Surgery   Left lumpectomy: IDC grade 2, 1.3 cm, intermediate grade DCIS, margins negative, 1/2 lymph nodes positive, ER 5% positive weak staining, PR 0%, HER-2 negative ratio 1.73, Ki-67 15%, T1cN0 stage IA AJCC 8   07/10/2017 Miscellaneous   Mammaprint: Low risk, 10-year risk of recurrence untreated 10%   10/01/2017 - 11/14/2017 Radiation Therapy   Adj XRT   11/2017 -  Anti-estrogen oral therapy   Letrozole daily     In terms of breast cancer risk profile:  She menarched at early age of 21 and went to menopause at age mid 26's.  She had 5 pregnancies, her first child was born at age 72.  She received birth control pills for approximately 4 years.  She was never exposed to fertility medications or hormone replacement therapy.  She has no family history of Breast/GYN/GI cancer  MEDICAL HISTORY:  Past Medical History:  Diagnosis Date   Anemia 04/02/2015   Arthritis    Atypical glandular cells on vaginal Papanicolaou smear 04/12/2016   Well get colpo and biopsy with JVF   Bronchitis, chronic obstructive ([place]) since 2000's   Cancer ([place])    dx with endometrial cancer    Dysrhythmia    GERD (gastroesophageal reflux disease)    Gout    Heart murmur    Hiatal hernia    Hiatal hernia 11/19/2012   History of endometrial cancer 04/06/2016   Sp hysterectomy 2012   Hypertension    Ischemic colitis ([place])    03/2015   Myeloproliferative disorder, JAK-2 positive 01/05/2014   Obesity    Obesity, morbid (more than  100 lbs over ideal weight or BMI > 40) ([facility]) 08/2011   Ht 5'7", wt 280 lb   Pneumonia 09/2014   Polycythemia vera(238.4) 01/18/2014   PONV (postoperative nausea and vomiting)    post-op hypoxic resp failure 09/2011 and require Bi-Pap (possible r/t - pressure pulm edema vs asp PNA, COPD exacerbation, obesity hypoventilation   Pulmonary hypertension (H[facility])    Shortness of  breath    unable to lay flat due to dyspnea   Vaginal Pap smear, abnormal     SURGICAL HISTORY: Past Surgical History:  Procedure Laterality Date   ABDOMINAL HYSTERECTOMY  09/12/2011   Procedure: HYSTERECTOMY ABDOMINAL;  Surgeon: Janie Morning, MD;  Location: [facility];  Service: Gynecology;  Laterality: N/A;  Total Abdominal Hysterectomy, Bilateral Salpingo Oophorectomy   BREAST LUMPECTOMY Left 07/10/2017   LEFT BREAST LUMPECTOMY WITH RADIOACTIVE SEED AND SENTINEL LYMPH NODE BIOPSY    BREAST LUMPECTOMY WITH RADIOACTIVE SEED AND SENTINEL LYMPH NODE BIOPSY Left 07/10/2017   Procedure: LEFT BREAST LUMPECTOMY WITH RADIOACTIVE SEED AND SENTINEL LYMPH NODE BIOPSY;  Surgeon: Rolm Bookbinder, MD;  Location: [facility];  Service: General;  Laterality: Left;   COLONOSCOPY  07/05/2012   Procedure: COLONOSCOPY;  Surgeon: Rogene Houston, MD;  [facility];  Service: Endoscopy;  Laterality: N/A;  730   EYE SURGERY     bilateral 8 - 12 yrs ago   FLEXIBLE SIGMOIDOSCOPY N/A 04/03/2015   Procedure: FLEXIBLE SIGMOIDOSCOPY;  Surgeon: Rogene Houston, MD;  [facility];  Service: Endoscopy;  Laterality: N/A;   HYSTEROSCOPY WITH D & C  08/15/2011   Procedure: DILATATION AND CURETTAGE (D&C) /HYSTEROSCOPY;  Surgeon: Jonnie Kind, MD;  Location: [facility];  Service: Gynecology;  Laterality: N/A;  With Suction Curette   JOINT REPLACEMENT     left knee replaced 2014   KNEE ARTHROPLASTY Left 03/15/2015   Procedure: COMPUTER ASSISTED TOTAL KNEE ARTHROPLASTY;  Surgeon: Marybelle Killings, MD;  Location: [facility];  Service: Orthopedics;  Laterality: Left;   SALPINGOOPHORECTOMY  09/12/2011   Procedure: SALPINGO OOPHERECTOMY;  Surgeon: Janie Morning, MD;  [facility];  Service: Gynecology;  Laterality: Bilateral;    SOCIAL HISTORY: Social History   Socioeconomic History   Marital status: Married    Spouse name: Not on file   Number of children: Not on file   Years of education: Not on file   Highest  education level: Not on file  Occupational History   Not on file  Tobacco Use   Smoking status: Never   Smokeless tobacco: Never  Vaping Use   Vaping Use: Never used  Substance and Sexual Activity   Alcohol use: No   Drug use: No   Sexual activity: Never    Birth control/protection: Surgical    Comment: hysterectomy  Other Topics Concern   Not on file  Social History Narrative   Not on file   Social Determinants of Health   Financial Resource Strain: Low Risk    Difficulty of Paying Living Expenses: Not hard at all  Food Insecurity: No Food Insecurity   Worried About Charity fundraiser in the Last Year: Never true   [place] in the Last Year: Never true  Transportation Needs: No Transportation Needs   Lack of Transportation (Medical): No   Lack of Transportation (Non-Medical): No  Physical Activity: Insufficiently Active   Days of Exercise per Week: 3 days   Minutes of Exercise per Session: 30 min  Stress: No  Stress Concern Present   Feeling of Stress : Only a little  Social Connections: Moderately Integrated   Frequency of Communication with Friends and Family: More than three times a week   Frequency of Social Gatherings with Friends and Family: More than three times a week   Attends Religious Services: More than 4 times per year   Active Member of [employer] or Organizations: No   Attends Music therapist: Never   Marital Status: Married  Human resources officer Violence: Not At Risk   Fear of Current or Ex-Partner: No   Emotionally Abused: No   Physically Abused: No   Sexually Abused: No    FAMILY HISTORY: Family History  Problem Relation Age of Onset   Other Mother    Lung cancer Father    Hypertension Son    Anesthesia problems Neg Hx    Hypotension Neg Hx    Malignant hyperthermia Neg Hx    Pseudochol deficiency Neg Hx     ALLERGIES:  is allergic to carbamazepine and sulfa antibiotics.  MEDICATIONS:  Current Outpatient Medications   Medication Sig Dispense Refill   aspirin EC 81 MG tablet Take 81 mg by mouth at bedtime.      carvedilol (COREG) 12.5 MG tablet Take 12.5 mg by mouth 2 (two) times daily.     cloNIDine (CATAPRES) 0.3 MG tablet Take 0.15 mg by mouth 2 (two) times daily.     diltiazem (CARDIZEM CD) 180 MG 24 hr capsule Take 180 mg by mouth daily.      ergocalciferol (VITAMIN D2) 1.25 MG (50000 UT) capsule Take 1 capsule (50,000 Units total) by mouth once a week. 12 capsule 1   esomeprazole (NEXIUM) 40 MG capsule Take 1 capsule (40 mg total) by mouth every morning. 30 capsule 11   furosemide (LASIX) 20 MG tablet   12   furosemide (LASIX) 40 MG tablet Take 40 mg by mouth daily.     HYDROcodone-acetaminophen (NORCO/VICODIN) 5-325 MG tablet One tablet every four hours for pain. 30 tablet 0   hydroxyurea (HYDREA) 500 MG capsule Take 2 tablets on Monday, Wednesday and Friday and 1 tablet daily rest of the week.  May take with food to minimize GI side effects. 90 capsule 3   letrozole (FEMARA) 2.5 MG tablet TAKE 1 TABLET BY MOUTH ONCE DAILY . APPOINTMENT REQUIRED FOR FUTURE REFILLS 30 tablet 0   levofloxacin (LEVAQUIN) 750 MG tablet Take 1 tablet (750 mg total) by mouth daily. 7 tablet 0   losartan (COZAAR) 100 MG tablet Take 100 mg by mouth every evening.      Simethicone (GAS RELIEF PO) Take 1 tablet by mouth daily.     simvastatin (ZOCOR) 10 MG tablet Take 10 mg by mouth every evening.      No current facility-administered medications for this visit.    REVIEW OF SYSTEMS:   Review of Systems  Constitutional:  Positive for fatigue (80%). Appetite change: 100%. HENT:   Negative for mouth sores.   Respiratory:  Positive for shortness of breath (stable, only with exertion). Negative for cough.   Cardiovascular:  Positive for leg swelling (stable, bilateral). Negative for chest pain.  Gastrointestinal:  Negative for abdominal pain, blood in stool, constipation, diarrhea, nausea and vomiting.  Endocrine: Negative  for hot flashes.  Musculoskeletal:  Negative for arthralgias. Myalgias: cramping in thighs. Skin:  Negative for itching.  Neurological:  Positive for dizziness. Negative for headaches and numbness.  All other systems reviewed and are negative.  PHYSICAL  EXAMINATION: ECOG PERFORMANCE STATUS: 1 - Symptomatic but completely ambulatory  Vitals:   05/30/21 1436  BP: (!) 136/59  Pulse: 75  Resp: 19  Temp: (!) 96.7 F (35.9 C)  SpO2: 91%   Filed Weights   05/30/21 1436  Weight: 243 lb 8 oz (110.5 kg)   Physical Exam Vitals reviewed.  Constitutional:      Appearance: Normal appearance. She is obese.     Comments: Using rollator  Cardiovascular:     Rate and Rhythm: Normal rate and regular rhythm.     Pulses: Normal pulses.     Heart sounds: Normal heart sounds.  Pulmonary:     Effort: Pulmonary effort is normal.     Breath sounds: Normal breath sounds.  Chest:  Breasts:    Right: Normal. No swelling, bleeding, inverted nipple, mass, nipple discharge, skin change, tenderness, axillary adenopathy or supraclavicular adenopathy.     Left: Skin change (lumpectomy scar well healed) present. No swelling, bleeding, inverted nipple, mass, nipple discharge, tenderness, axillary adenopathy or supraclavicular adenopathy.  Abdominal:     Palpations: Abdomen is soft. There is no hepatomegaly, splenomegaly or mass.     Tenderness: There is no abdominal tenderness.     Hernia: No hernia is present.  Musculoskeletal:     Right lower leg: Edema (chronic lymphedema) present.     Left lower leg: Edema (chronic lymphedema) present.  Lymphadenopathy:     Cervical: No cervical adenopathy.     Upper Body:     Right upper body: No supraclavicular, axillary or pectoral adenopathy.     Left upper body: No supraclavicular, axillary or pectoral adenopathy.  Neurological:     General: No focal deficit present.     Mental Status: She is alert and oriented to person, place, and time.  Psychiatric:         Mood and Affect: Mood normal.        Behavior: Behavior normal.     LABORATORY DATA:  I have reviewed the data as listed Recent Results (from the past 2160 hour(s))  Basic metabolic panel     Status: Abnormal   Collection Time: 04/25/21  5:11 PM  Result Value Ref Range   Sodium 137 135 - 145 mmol/L   Potassium 3.4 (L) 3.5 - 5.1 mmol/L   Chloride 99 98 - 111 mmol/L   CO2 30 22 - 32 mmol/L   Glucose, Bld 146 (H) 70 - 99 mg/dL    Comment: Glucose reference range applies only to samples taken after fasting for at least 8 hours.   BUN 17 8 - 23 mg/dL   Creatinine, Ser 0.68 0.44 - 1.00 mg/dL   Calcium 8.8 (L) 8.9 - 10.3 mg/dL   GFR, Estimated >60 >60 mL/min    Comment: (NOTE) Calculated using the CKD-EPI Creatinine Equation (2021)    Anion gap 8 5 - 15    Comment: Performed at [facility], [address], [place], [place] [address]  Brain natriuretic peptide     Status: None   Collection Time: 04/25/21  5:11 PM  Result Value Ref Range   B Natriuretic Peptide 57.0 0.0 - 100.0 pg/mL    Comment: Performed at [facility], [address], [place], [place] [address]  Troponin I (High Sensitivity)     Status: None   Collection Time: 04/25/21  5:11 PM  Result Value Ref Range   Troponin I (High Sensitivity) 4 <18 ng/L    Comment: (NOTE) Elevated high sensitivity troponin I (hsTnI)  values and significant  changes across serial measurements may suggest ACS but many other  chronic and acute conditions are known to elevate hsTnI results.  Refer to the "Links" section for chest pain algorithms and additional  guidance. Performed at [facility], [address] [place], [place] [address]   CBC with Differential     Status: Abnormal   Collection Time: 04/25/21  5:11 PM  Result Value Ref Range   WBC 8.9 4.0 - 10.5 K/uL   RBC 3.91 3.87 - 5.11 MIL/uL   Hemoglobin 13.6 12.0 - 15.0 g/dL   HCT 41.0 36.0 - 46.0 %   MCV 104.9 (H) 80.0 - 100.0 fL   MCH 34.8 (H) 26.0 - 34.0 pg   MCHC 33.2 30.0  - 36.0 g/dL   RDW 14.2 11.5 - 15.5 %   Platelets 510 (H) 150 - 400 K/uL   nRBC 0.0 0.0 - 0.2 %   Neutrophils Relative % 72 %   Neutro Abs 6.4 1.7 - 7.7 K/uL   Lymphocytes Relative 16 %   Lymphs Abs 1.4 0.7 - 4.0 K/uL   Monocytes Relative 7 %   Monocytes Absolute 0.7 0.1 - 1.0 K/uL   Eosinophils Relative 3 %   Eosinophils Absolute 0.3 0.0 - 0.5 K/uL   Basophils Relative 1 %   Basophils Absolute 0.1 0.0 - 0.1 K/uL   Immature Granulocytes 1 %   Abs Immature Granulocytes 0.04 0.00 - 0.07 K/uL    Comment: Performed at [facility], [address], [place], [place] [address]  D-dimer, quantitative     Status: Abnormal   Collection Time: 04/25/21  5:11 PM  Result Value Ref Range   D-Dimer, Quant 0.62 (H) 0.00 - 0.50 ug/mL-FEU    Comment: (NOTE) At the manufacturer cut-off value of 0.5 g/mL FEU, this assay has a negative predictive value of 95-100%.This assay is intended for use in conjunction with a clinical pretest probability (PTP) assessment model to exclude pulmonary embolism (PE) and deep venous thrombosis (DVT) in outpatients suspected of PE or DVT. Results should be correlated with clinical presentation. Performed at [facility], [address], [place], [place] [address]   Blood gas, venous     Status: Abnormal   Collection Time: 04/25/21  5:11 PM  Result Value Ref Range   FIO2 21.00    pH, Ven 7.417 7.250 - 7.430   pCO2, Ven 50.5 44.0 - 60.0 mmHg   pO2, Ven 39.9 32.0 - 45.0 mmHg   Bicarbonate 29.7 (H) 20.0 - 28.0 mmol/L   Acid-Base Excess 7.4 (H) 0.0 - 2.0 mmol/L   O2 Saturation 74.9 %   Patient temperature 36.4     Comment: Performed at [facility], [address] [place], [place] [address]  Troponin I (High Sensitivity)     Status: None   Collection Time: 04/25/21  7:15 PM  Result Value Ref Range   Troponin I (High Sensitivity) 5 <18 ng/L    Comment: (NOTE) Elevated high sensitivity troponin I (hsTnI) values and significant  changes across serial measurements may  suggest ACS but many other  chronic and acute conditions are known to elevate hsTnI results.  Refer to the "Links" section for chest pain algorithms and additional  guidance. Performed at [facility], [address], [place], [place] [address]   CBC with Differential     Status: Abnormal   Collection Time: 05/24/21  2:56 PM  Result Value Ref Range   WBC 7.2 4.0 - 10.5 K/uL  RBC 4.10 3.87 - 5.11 MIL/uL   Hemoglobin 14.2 12.0 - 15.0 g/dL   HCT 44.6 36.0 - 46.0 %   MCV 108.8 (H) 80.0 - 100.0 fL   MCH 34.6 (H) 26.0 - 34.0 pg   MCHC 31.8 30.0 - 36.0 g/dL   RDW 14.4 11.5 - 15.5 %   Platelets 407 (H) 150 - 400 K/uL   nRBC 0.0 0.0 - 0.2 %   Neutrophils Relative % 65 %   Neutro Abs 4.6 1.7 - 7.7 K/uL   Lymphocytes Relative 21 %   Lymphs Abs 1.5 0.7 - 4.0 K/uL   Monocytes Relative 8 %   Monocytes Absolute 0.6 0.1 - 1.0 K/uL   Eosinophils Relative 5 %   Eosinophils Absolute 0.4 0.0 - 0.5 K/uL   Basophils Relative 1 %   Basophils Absolute 0.1 0.0 - 0.1 K/uL   Immature Granulocytes 0 %   Abs Immature Granulocytes 0.02 0.00 - 0.07 K/uL    Comment: Performed at [facility], [address] [place] [address] [place], [place] [address]  Comprehensive metabolic panel     Status: Abnormal   Collection Time: 05/24/21  2:56 PM  Result Value Ref Range   Sodium 136 135 - 145 mmol/L   Potassium 4.0 3.5 - 5.1 mmol/L   Chloride 102 98 - 111 mmol/L   CO2 25 22 - 32 mmol/L   Glucose, Bld 88 70 - 99 mg/dL    Comment: Glucose reference range applies only to samples taken after fasting for at least 8 hours.   BUN 24 (H) 8 - 23 mg/dL   Creatinine, Ser 0.60 0.44 - 1.00 mg/dL   Calcium 8.9 8.9 - 10.3 mg/dL   Total Protein 6.9 6.5 - 8.1 g/dL   Albumin 3.8 3.5 - 5.0 g/dL   AST 16 15 - 41 U/L   ALT 12 0 - 44 U/L   Alkaline Phosphatase 75 38 - 126 U/L   Total Bilirubin 0.9 0.3 - 1.2 mg/dL   GFR, Estimated >60 >60 mL/min    Comment: (NOTE) Calculated using the CKD-EPI Creatinine Equation (2021)    Anion gap 9 5 - 15     Comment: Performed at [facility], [address] [place], [place] [address]  Lactate dehydrogenase     Status: None   Collection Time: 05/24/21  2:56 PM  Result Value Ref Range   LDH 148 98 - 192 U/L    Comment: Performed at [facility], [address], [place], [place] [address]  Vitamin D 25 hydroxy     Status: None   Collection Time: 05/24/21  2:56 PM  Result Value Ref Range   Vit D, 25-Hydroxy 70.66 30 - 100 ng/mL    Comment: (NOTE) Vitamin D deficiency has been defined by the [place] practice guideline as a level of serum 25-OH  vitamin D less than 20 ng/mL (1,2). The Endocrine Society went on to  further define vitamin D insufficiency as a level between 21 and 29  ng/mL (2).  1. IOM (Institute of Medicine). 2010. Dietary reference intakes for  calcium and D. [place]: The [employer]. 2. Holick MF, Binkley [place], Bischoff-Ferrari HA, et al. Evaluation,  treatment, and prevention of vitamin D deficiency: an Endocrine  Society clinical practice guideline, JCEM. 2011 Jul; 96(7): 1911-30.  Performed at [facility], [place] [address], [place], [place] [address]     RADIOGRAPHIC STUDIES: I have personally reviewed the radiological reports  and agreed with the findings in the report. DG Bone Density  Result Date: 05/24/2021 EXAM: DUAL X-RAY ABSORPTIOMETRY (DXA) FOR BONE MINERAL DENSITY IMPRESSION: Your patient [name] completed a BMD test on 05/24/2021 using the [place] (software version: 14.10) manufactured by [employer]. The following summarizes the results of our evaluation. Technologist: BRG PATIENT BIOGRAPHICAL: Name: Lori, Stafford Patient ID: [id number] Birth Date: 1941/07/26 Height: 64.0 in. Gender: Female Exam Date: 05/24/2021 Weight: 238.1 lbs. Indications: Low Calcium Intake, Bilateral Oophrectomy, Hx Breast Ca, Height Loss, Caucasian, Post Menopausal Fractures:  Treatments: Asprin, Multivitamin DENSITOMETRY RESULTS: Site         Region     Measured Date Measured Age WHO Classification Young Adult T-score BMD         %Change vs. Previous Significant Change (*) DualFemur Neck Left 4/24/2020 81.4 Normal -0.8 0.933 g/cm2 -3.2% - DualFemur Neck Left 12/17/2017 78.0 Normal -0.5 0.964 g/cm2 -4.4% Yes DualFemur Neck Left [birth year]08/2010 70.1 Normal -0.2 1.008 g/cm2 - - DualFemur Total Mean 05/24/2021 81.4 Normal -0.2 0.980 g/cm2 -9.6% Yes DualFemur Total Mean 12/17/2017 78.0 - - 1.084 g/cm2 -8.2% Yes DualFemur Total Mean 02/08/2010 70.1 Normal 1.4 1.181 g/cm2 - - Left Forearm Radius 33% 05/24/2021 81.4 Normal -1.0 0.631 g/cm2 -15.5% Yes Left Forearm Radius 33% 12/17/2017 78.0 Normal 0.6 0.747 g/cm2 - - ASSESSMENT: The BMD measured at Forearm Radius 33% is 0.631 g/cm2 with a T-score of -1.0. This patient is considered normal according to [facility]) criteria. Compared with the prior study on 12/17/17 , the BMD of the total mean shows a statistically significant increase. Patient is not a candidate for FRAX assessment due to normal diagnosis. The scan quality is good. Lumbar spine was excluded due to advanced degenerative changes. World Pharmacologist [facility]) criteria for post-menopausal, Caucasian Women: Normal:       T-score at or above -1 SD Osteopenia:   T-score between -1 and -2.5 SD Osteoporosis: T-score at or below -2.5 SD RECOMMENDATIONS: 1. All patients should optimize calcium and vitamin D intake. 2. Consider FDA-approved medical therapies in postmenopausal women and med aged 62 years and older, based on the following: a. A hip or vertebral (clinical or morphometric) fracture b. T-score< -2.5 at the femoral neck or spine after appropriate evaluation to exclude secondary causes c. Low bone mass (T-score between -1.0 and -2.5 at the femoral neck or spine) and a 10-year probability of a hip fracture > 3% or a 10-year probability of a major  osteoporosis-related fracture > 20% based on the [place]-adapted WHO algorithm d. Clinician judgment and/or patient preferences may indicate treatment for people with 10-year fracture probabilities above or below these levels FOLLOW-UP: Patients with diagnosis of osteoporsis or at high risk for fracture should have regular bone mineral density tests. For patients eligible for Medicare, routine testing is allowed once every 2 years. The testing frequency can be increased to one year for patients who have rapidly progressing disease, those who are receiving or discontinuing medical therapy to restore bone mass, or have additional risk factors. I have reviewed this report, and agree with the above findings. Mark A. Thornton Papas, M.D. [facility] Electronically Signed   By: Lavonia Dana M.D.   On: 05/24/2021 17:05   MM DIAG BREAST TOMO BILATERAL  Result Date: 05/24/2021 CLINICAL DATA:  80 year old female with history of left breast cancer post lumpectomy 2018. EXAM: DIGITAL DIAGNOSTIC BILATERAL MAMMOGRAM WITH TOMOSYNTHESIS AND CAD TECHNIQUE: Bilateral digital diagnostic mammography and breast tomosynthesis  was performed. The images were evaluated with computer-aided detection. COMPARISON:  Previous exams. ACR Breast Density Category b: There are scattered areas of fibroglandular density. FINDINGS: No suspicious masses or calcifications are seen in either breast. Lumpectomy changes again identified in the slightly upper outer left breast. Spot compression magnification CC view of the left breast lumpectomy site was performed. Evolving dystrophic type calcifications are present at the lumpectomy site with no mammographic evidence of locally recurrent malignancy. IMPRESSION: Stable lumpectomy changes in the left breast. No mammographic evidence of malignancy in either breast. RECOMMENDATION: Screening mammogram in one year.(Code:SM-B-01Y) I have discussed the findings and recommendations with the patient. If  applicable, a reminder letter will be sent to the patient regarding the next appointment. BI-RADS CATEGORY  2: Benign. Electronically Signed   By: Everlean Alstrom M.D.   On: 05/24/2021 14:09   Intravitreal Injection, Pharmacologic Agent - OS - Left Eye  Result Date: 05/17/2021 Time Out 05/17/2021. 3:25 PM. Confirmed correct patient, procedure, site, and patient consented. Anesthesia Anesthetic medications included Akten 3.5%. Procedure Preparation included Tobramycin 0.3%, 10% betadine to eyelids, 5% betadine to ocular surface. A 30 gauge needle was used. Injection: 2.5 mg bevacizumab 2.5 MG/0.1ML   Route: Intravitreal, Site: Left Eye   NDC: [phone], Lot: [id number]   OCT, Retina - OU - Both Eyes  Result Date: 05/17/2021 Right Eye Quality was good. Scan locations included subfoveal. Central Foveal Thickness: 253. Progression has been stable. Findings include no SRF, no IRF. Left Eye Quality was good. Scan locations included subfoveal. Central Foveal Thickness: 260. Progression has improved. Findings include no IRF, subretinal fluid. Notes OS today at 9 weeks post injection we planned for repeat injection today, with still with subretinal fluid active but much less today at this longer interval.  Repeat injection today and maintain the current interval examination OD today now 5 months post last injection stable no sign of CNVM  OCT, Retina - OU - Both Eyes  Result Date: 05/03/2021 Right Eye Quality was good. Scan locations included subfoveal. Central Foveal Thickness: 252. Progression has been stable. Findings include no SRF, no IRF. Left Eye Quality was good. Scan locations included subfoveal. Central Foveal Thickness: 248. Progression has improved. Findings include no IRF, subretinal fluid. Notes OS today at 6 weeks post injection we planned for repeat injection today, yet patient became GI upset with vomiting prior to examination thus we will reschedule in the coming 1 to 2 weeks for examination OD  today now 3 months post last injection stable no sign of CNVM     ASSESSMENT:  1.  Malignant neoplasm of upper-outer quadrant of left breast: -Left breast biopsy on 05/29/2017 of the upper outer quadrant mass-IDC, grade 1/2.  ER 5%, PR 0%, Ki-67 15%, HER-2 negative. -Left breast lumpectomy on 07/10/2017, IDC, grade 2, 1.3 cm, margins negative, 1/2 lymph nodes positive, pT1c, PN 1A -She was evaluated by Dr. Lindi Adie and was last seen in 2020. -MammaPrint-low risk luminal A, average 10-year risk of recurrence untreated 10%. -Adjuvant XRT from 2725 2018-1 08/2018. -Letrozole 1 mg daily for 5 to 7 years started on 11/15/2017.  2.  Essential thrombocytosis: -No vasomotor symptoms or prior history of thrombosis. -Hydroxyurea 100 mg daily started on 10/01/2018.  3.  Social/family history: -She was recently widowed in February 2022.  She is living independently.  She works part-time for 6 hours a day, office job.  She is non-smoker. -Father had lung cancer.    PLAN:  1.  T1CN1A left breast cancer: -  She is tolerating letrozole without any major hot flashes. - She reports some cramping in her legs on and off. - Reviewed mammogram from 05/24/2021, BI-RADS Category 2. Next one due in one year July 2023.  - Physical exam today did not reveal any palpable masses or adenopathy. - Patient will return in 4 months for follow-up and labs.  2.  Essential thrombocytosis: - She is tolerating hydroxyurea taking 2 tablets on Monday, Wednesday and Friday and 1 tablet daily rest of the week.  - Labs from 05/24/2021 shows improvement of platelet count from 510 to 407.  - Recommend to continue current dose of hydroxyurea.  - We will plan to repeat labs in 4 months.  3.  Vitamin D deficiency: - Vitamin D level has improved from 12.26 to 70.66 - Recommend to continue vitamin D 50,000 units weekly. --We will recheck levels in 4 months.    All questions were answered. The patient knows to call the clinic with any  problems, questions or concerns.  I have spent a total of 30 minutes minutes of face-to-face and non-face-to-face time, preparing to see the patient, obtaining and/or reviewing separately obtained history, performing a medically appropriate examination, counseling and educating the patient, ordering tests, documenting clinical information in the electronic health record, and care coordination.   Lincoln Brigham, PA-C Hematology and [place] at [facility]

## 2021-06-02 DIAGNOSIS — J449 Chronic obstructive pulmonary disease, unspecified: Secondary | ICD-10-CM | POA: Diagnosis not present

## 2021-06-02 DIAGNOSIS — J9601 Acute respiratory failure with hypoxia: Secondary | ICD-10-CM | POA: Diagnosis not present

## 2021-06-02 DIAGNOSIS — Z96652 Presence of left artificial knee joint: Secondary | ICD-10-CM | POA: Diagnosis not present

## 2021-06-21 ENCOUNTER — Other Ambulatory Visit (HOSPITAL_COMMUNITY): Payer: Self-pay | Admitting: Hematology

## 2021-06-27 DIAGNOSIS — I10 Essential (primary) hypertension: Secondary | ICD-10-CM | POA: Diagnosis not present

## 2021-06-27 DIAGNOSIS — Z79899 Other long term (current) drug therapy: Secondary | ICD-10-CM | POA: Diagnosis not present

## 2021-06-27 DIAGNOSIS — E875 Hyperkalemia: Secondary | ICD-10-CM | POA: Diagnosis not present

## 2021-06-27 DIAGNOSIS — I5032 Chronic diastolic (congestive) heart failure: Secondary | ICD-10-CM | POA: Diagnosis not present

## 2021-07-01 ENCOUNTER — Other Ambulatory Visit (HOSPITAL_COMMUNITY): Payer: Self-pay | Admitting: Hematology

## 2021-07-01 DIAGNOSIS — Z170 Estrogen receptor positive status [ER+]: Secondary | ICD-10-CM

## 2021-07-03 DIAGNOSIS — J9601 Acute respiratory failure with hypoxia: Secondary | ICD-10-CM | POA: Diagnosis not present

## 2021-07-03 DIAGNOSIS — J449 Chronic obstructive pulmonary disease, unspecified: Secondary | ICD-10-CM | POA: Diagnosis not present

## 2021-07-03 DIAGNOSIS — Z96652 Presence of left artificial knee joint: Secondary | ICD-10-CM | POA: Diagnosis not present

## 2021-07-04 ENCOUNTER — Encounter (INDEPENDENT_AMBULATORY_CARE_PROVIDER_SITE_OTHER): Payer: Self-pay | Admitting: Ophthalmology

## 2021-07-04 DIAGNOSIS — I5032 Chronic diastolic (congestive) heart failure: Secondary | ICD-10-CM | POA: Diagnosis not present

## 2021-07-04 DIAGNOSIS — E875 Hyperkalemia: Secondary | ICD-10-CM | POA: Diagnosis not present

## 2021-07-18 DIAGNOSIS — Z961 Presence of intraocular lens: Secondary | ICD-10-CM | POA: Diagnosis not present

## 2021-07-18 DIAGNOSIS — H353111 Nonexudative age-related macular degeneration, right eye, early dry stage: Secondary | ICD-10-CM | POA: Diagnosis not present

## 2021-07-19 ENCOUNTER — Other Ambulatory Visit: Payer: Self-pay

## 2021-07-19 ENCOUNTER — Encounter (INDEPENDENT_AMBULATORY_CARE_PROVIDER_SITE_OTHER): Payer: Self-pay | Admitting: Ophthalmology

## 2021-07-19 ENCOUNTER — Ambulatory Visit (INDEPENDENT_AMBULATORY_CARE_PROVIDER_SITE_OTHER): Payer: Medicare Other | Admitting: Ophthalmology

## 2021-07-19 DIAGNOSIS — H353221 Exudative age-related macular degeneration, left eye, with active choroidal neovascularization: Secondary | ICD-10-CM | POA: Diagnosis not present

## 2021-07-19 DIAGNOSIS — H353212 Exudative age-related macular degeneration, right eye, with inactive choroidal neovascularization: Secondary | ICD-10-CM

## 2021-07-19 MED ORDER — BEVACIZUMAB 2.5 MG/0.1ML IZ SOSY
2.5000 mg | PREFILLED_SYRINGE | INTRAVITREAL | Status: AC | PRN
  Administered 2021-07-19: 2.5 mg via INTRAVITREAL

## 2021-07-19 NOTE — Assessment & Plan Note (Signed)
Stable now at 7 to 8 months post most recent injection

## 2021-07-19 NOTE — Assessment & Plan Note (Signed)
OS again stable at 9-week follow-up post injection Avastin for longstanding and occasionally recurrent CNVM. We will repeat injection today to maintain stability of findings and examination next OU in 10 weeks

## 2021-07-19 NOTE — Progress Notes (Signed)
07/19/2021     CHIEF COMPLAINT Patient presents for  Chief Complaint  Patient presents with   Retina Follow Up    9 week fu Avastin OS Pt states VA OU stable since last visit. Pt denies FOL, floaters, or ocular pain OU.         HISTORY OF PRESENT ILLNESS: [name] is a 80 y.o. female who presents to the clinic today for:   HPI     Retina Follow Up   Patient presents with  Wet AMD.  In left eye.  This started 9 weeks ago.  Severity is mild.  Duration of 9 weeks.  Since onset it is stable. Additional comments: 9 week fu Avastin OS Pt states VA OU stable since last visit. Pt denies FOL, floaters, or ocular pain OU.           Comments   9 weeks fu os oct avastin os. Patient states vision is stable and unchanged since last visit. Denies any new floaters or FOL.       Last edited by Laurin Coder on 07/19/2021  1:35 PM.      Referring physician: [name], MD [id number] [place],  [place] [address]  HISTORICAL INFORMATION:   Selected notes from the MEDICAL RECORD NUMBER       CURRENT MEDICATIONS: No current outpatient medications on file. (Ophthalmic Drugs)   No current facility-administered medications for this visit. (Ophthalmic Drugs)   Current Outpatient Medications (Other)  Medication Sig   aspirin EC 81 MG tablet Take 81 mg by mouth at bedtime.    carvedilol (COREG) 12.5 MG tablet Take 12.5 mg by mouth 2 (two) times daily.   cloNIDine (CATAPRES) 0.3 MG tablet Take 0.15 mg by mouth 2 (two) times daily.   diltiazem (CARDIZEM CD) 180 MG 24 hr capsule Take 180 mg by mouth daily.    esomeprazole (NEXIUM) 40 MG capsule Take 1 capsule (40 mg total) by mouth every morning.   furosemide (LASIX) 20 MG tablet    furosemide (LASIX) 40 MG tablet Take 40 mg by mouth daily.   HYDROcodone-acetaminophen (NORCO/VICODIN) 5-325 MG tablet One tablet every four hours for pain.   hydroxyurea (HYDREA) 500 MG capsule Take 2 tablets on Monday, Wednesday  and Friday and 1 tablet daily rest of the week.  May take with food to minimize GI side effects.   letrozole (FEMARA) 2.5 MG tablet TAKE 1 TABLET BY MOUTH ONCE DAILY . APPOINTMENT REQUIRED FOR FUTURE REFILLS   levofloxacin (LEVAQUIN) 750 MG tablet Take 1 tablet (750 mg total) by mouth daily.   losartan (COZAAR) 100 MG tablet Take 100 mg by mouth every evening.    Simethicone (GAS RELIEF PO) Take 1 tablet by mouth daily.   simvastatin (ZOCOR) 10 MG tablet Take 10 mg by mouth every evening.    Vitamin D, Ergocalciferol, (DRISDOL) 1.25 MG (50000 UNIT) CAPS capsule Take 1 capsule by mouth once a week   No current facility-administered medications for this visit. (Other)      REVIEW OF SYSTEMS:    ALLERGIES Allergies  Allergen Reactions   Carbamazepine Hives and Other (See Comments)    headache   Sulfa Antibiotics     Rash    PAST MEDICAL HISTORY Past Medical History:  Diagnosis Date   Anemia 04/02/2015   Arthritis    Atypical glandular cells on vaginal Papanicolaou smear 04/12/2016   Well get colpo and biopsy with JVF   Bronchitis, chronic obstructive ([place])  since 2000's   Cancer [facility])    dx with endometrial cancer    Dysrhythmia    GERD (gastroesophageal reflux disease)    Gout    Heart murmur    Hiatal hernia    Hiatal hernia 11/19/2012   History of endometrial cancer 04/06/2016   Sp hysterectomy 2012   Hypertension    Ischemic colitis ([place])    03/2015   Myeloproliferative disorder, JAK-2 positive 01/05/2014   Obesity    Obesity, morbid (more than 100 lbs over ideal weight or BMI > 40) ([place]) 08/2011   Ht 5'7", wt 280 lb   Pneumonia 09/2014   Polycythemia vera(238.4) 01/18/2014   PONV (postoperative nausea and vomiting)    post-op hypoxic resp failure 09/2011 and require Bi-Pap (possible r/t - pressure pulm edema vs asp PNA, COPD exacerbation, obesity hypoventilation   Pulmonary hypertension ([place])    Shortness of breath    unable to lay flat due to dyspnea   Vaginal Pap  smear, abnormal    Past Surgical History:  Procedure Laterality Date   ABDOMINAL HYSTERECTOMY  09/12/2011   Procedure: HYSTERECTOMY ABDOMINAL;  Surgeon: Janie Morning, MD;  Location: [facility];  Service: Gynecology;  Laterality: N/A;  Total Abdominal Hysterectomy, Bilateral Salpingo Oophorectomy   BREAST LUMPECTOMY Left 07/10/2017   LEFT BREAST LUMPECTOMY WITH RADIOACTIVE SEED AND SENTINEL LYMPH NODE BIOPSY    BREAST LUMPECTOMY WITH RADIOACTIVE SEED AND SENTINEL LYMPH NODE BIOPSY Left 07/10/2017   Procedure: LEFT BREAST LUMPECTOMY WITH RADIOACTIVE SEED AND SENTINEL LYMPH NODE BIOPSY;  Surgeon: Rolm Bookbinder, MD;  Location: [place];  Service: General;  Laterality: Left;   COLONOSCOPY  07/05/2012   Procedure: COLONOSCOPY;  Surgeon: Rogene Houston, MD;  Location: [facility];  Service: Endoscopy;  Laterality: N/A;  730   EYE SURGERY     bilateral 8 - 12 yrs ago   FLEXIBLE SIGMOIDOSCOPY N/A 04/03/2015   Procedure: FLEXIBLE SIGMOIDOSCOPY;  Surgeon: Rogene Houston, MD;  [facility]: [facility];  Service: Endoscopy;  Laterality: N/A;   HYSTEROSCOPY WITH D & C  08/15/2011   Procedure: DILATATION AND CURETTAGE (D&C) /HYSTEROSCOPY;  Surgeon: Jonnie Kind, MD;  Location: [facility];  Service: Gynecology;  Laterality: N/A;  With Suction Curette   JOINT REPLACEMENT     left knee replaced 2014   KNEE ARTHROPLASTY Left 03/15/2015   Procedure: COMPUTER ASSISTED TOTAL KNEE ARTHROPLASTY;  Surgeon: Marybelle Killings, MD;  Location: [place];  Service: Orthopedics;  Laterality: Left;   SALPINGOOPHORECTOMY  09/12/2011   Procedure: SALPINGO OOPHERECTOMY;  Surgeon: Janie Morning, MD;  Location: [facility];  Service: Gynecology;  Laterality: Bilateral;    FAMILY HISTORY Family History  Problem Relation Age of Onset   Other Mother    Lung cancer Father    Hypertension Son    Anesthesia problems Neg Hx    Hypotension Neg Hx    Malignant hyperthermia Neg Hx    Pseudochol deficiency Neg Hx     SOCIAL HISTORY Social  History   Tobacco Use   Smoking status: Never   Smokeless tobacco: Never  Vaping Use   Vaping Use: Never used  Substance Use Topics   Alcohol use: No   Drug use: No         OPHTHALMIC EXAM:  Base Eye Exam     Visual Acuity (ETDRS)       Right Left   Dist [place] 20/20 -2 20/30 -2         Tonometry (Tonopen,  1:36 PM)       Right Left   Pressure 5 3         Pupils       Pupils Dark Light APD   Right PERRL 2.5 2 None   Left PERRL 2.5 2 None         Extraocular Movement       Right Left    Full Full         Neuro/Psych     Oriented x3: Yes   Mood/Affect: Normal         Dilation     Left eye: 1.0% Mydriacyl, 2.5% Phenylephrine @ 1:39 PM           Slit Lamp and Fundus Exam     External Exam       Right Left   External Normal Normal         Slit Lamp Exam       Right Left   Lids/Lashes Normal Normal   Conjunctiva/Sclera White and quiet White and quiet   Cornea Clear Clear   Anterior Chamber Deep and quiet Deep and quiet   Iris Round and reactive Round and reactive   Lens Posterior chamber intraocular lens Posterior chamber intraocular lens   Anterior Vitreous Normal Normal         Fundus Exam       Right Left   Posterior Vitreous  Posterior vitreous detachment   Disc  Normal   C/D Ratio  0.25   Macula  no macular thickening clinically, , Retinal pigment epithelial mottling, Hard drusen, no exudates, Pigmented atrophy   Vessels  Normal   Periphery  Normal            IMAGING AND PROCEDURES  Imaging and Procedures for 07/19/21  OCT, Retina - OU - Both Eyes       Right Eye Quality was good. Scan locations included subfoveal. Central Foveal Thickness: 254. Progression has been stable. Findings include no SRF, no IRF.   Left Eye Quality was good. Scan locations included subfoveal. Central Foveal Thickness: 255. Progression has improved. Findings include no IRF, subretinal fluid.   Notes OS today at 9 weeks post  injection we planned for repeat injection today, with still with subretinal fluid active but much less today at this longer interval.  Repeat injection today and maintain the current interval examination OD today now 8 months post last injection stable no sign of CNVM     Intravitreal Injection, Pharmacologic Agent - OS - Left Eye       Time Out 07/19/2021. 1:53 PM. Confirmed correct patient, procedure, site, and patient consented.   Anesthesia Anesthetic medications included [place] 3.5%.   Procedure Preparation included Tobramycin 0.3%, 10% betadine to eyelids, 5% betadine to ocular surface. A 30 gauge needle was used.   Injection: 2.5 mg bevacizumab 2.5 MG/0.1ML   Route: Intravitreal, Site: Left Eye   NDC: [phone], Lot: [id number]   Post-op Post injection exam found visual acuity of at least counting fingers. The patient tolerated the procedure well. There were no complications. The patient received written and verbal post procedure care education. Post injection medications included ocuflox.              ASSESSMENT/PLAN:  Exudative age-related macular degeneration of right eye with inactive choroidal neovascularization (HCC) Stable now at 7 to 8 months post most recent injection  Exudative age-related macular degeneration of left eye with active choroidal neovascularization (HCC) OS again stable at 9-week  follow-up post injection Avastin for longstanding and occasionally recurrent CNVM. We will repeat injection today to maintain stability of findings and examination next OU in 10 weeks     ICD-10-CM   1. Exudative age-related macular degeneration of left eye with active choroidal neovascularization (HCC)  H35.3221 OCT, Retina - OU - Both Eyes    Intravitreal Injection, Pharmacologic Agent - OS - Left Eye    bevacizumab (AVASTIN) SOSY 2.5 mg    2. Exudative age-related macular degeneration of right eye with inactive choroidal neovascularization (HCC)  H35.3212        1.  OS, at 9-week follow-up no signs of active CNVM, repeat injection today to maintain an examination next in 10 weeks  2.  Dilate OU next  3.  OD has been stable now for 7 to 8 months  Ophthalmic Meds Ordered this visit:  Meds ordered this encounter  Medications   bevacizumab (AVASTIN) SOSY 2.5 mg       Return in about 10 weeks (around 09/27/2021) for DILATE OU, AVASTIN OCT, OS.  There are no Patient Instructions on file for this visit.   Explained the diagnoses, plan, and follow up with the patient and they expressed understanding.  Patient expressed understanding of the importance of proper follow up care.   Clent Demark Jeancarlos Marchena M.D. Diseases & Surgery of the Retina and Vitreous Retina & Diabetic Welcome 07/19/21     Abbreviations: M myopia (nearsighted); A astigmatism; H hyperopia (farsighted); P presbyopia; Mrx spectacle prescription;  CTL contact lenses; OD right eye; OS left eye; OU both eyes  XT exotropia; ET esotropia; PEK punctate epithelial keratitis; PEE punctate epithelial erosions; DES dry eye syndrome; MGD meibomian gland dysfunction; ATs artificial tears; PFAT's preservative free artificial tears; [place] nuclear sclerotic cataract; PSC posterior subcapsular cataract; ERM epi-retinal membrane; PVD posterior vitreous detachment; RD retinal detachment; DM diabetes mellitus; DR diabetic retinopathy; NPDR non-proliferative diabetic retinopathy; PDR proliferative diabetic retinopathy; CSME clinically significant macular edema; DME diabetic macular edema; dbh dot blot hemorrhages; CWS cotton wool spot; POAG primary open angle glaucoma; C/D cup-to-disc ratio; HVF humphrey visual field; GVF goldmann visual field; OCT optical coherence tomography; IOP intraocular pressure; BRVO Branch retinal vein occlusion; CRVO central retinal vein occlusion; CRAO central retinal artery occlusion; BRAO branch retinal artery occlusion; RT retinal tear; SB scleral buckle; PPV pars plana  vitrectomy; VH Vitreous hemorrhage; PRP panretinal laser photocoagulation; IVK intravitreal kenalog; VMT vitreomacular traction; MH Macular hole;  NVD neovascularization of the disc; NVE neovascularization elsewhere; AREDS age related eye disease study; ARMD age related macular degeneration; POAG primary open angle glaucoma; EBMD epithelial/anterior basement membrane dystrophy; ACIOL anterior chamber intraocular lens; IOL intraocular lens; PCIOL posterior chamber intraocular lens; Phaco/IOL phacoemulsification with intraocular lens placement; [place] photorefractive keratectomy; LASIK laser assisted in situ keratomileusis; HTN hypertension; DM diabetes mellitus; COPD chronic obstructive pulmonary disease

## 2021-08-03 DIAGNOSIS — Z96652 Presence of left artificial knee joint: Secondary | ICD-10-CM | POA: Diagnosis not present

## 2021-08-03 DIAGNOSIS — J449 Chronic obstructive pulmonary disease, unspecified: Secondary | ICD-10-CM | POA: Diagnosis not present

## 2021-08-03 DIAGNOSIS — J9601 Acute respiratory failure with hypoxia: Secondary | ICD-10-CM | POA: Diagnosis not present

## 2021-08-11 ENCOUNTER — Encounter: Payer: Self-pay | Admitting: Cardiology

## 2021-08-11 ENCOUNTER — Ambulatory Visit: Payer: Medicare Other | Admitting: Cardiology

## 2021-08-11 VITALS — BP 138/68 | HR 70 | Ht 64.0 in | Wt 249.0 lb

## 2021-08-11 DIAGNOSIS — I10 Essential (primary) hypertension: Secondary | ICD-10-CM

## 2021-08-11 DIAGNOSIS — Z87898 Personal history of other specified conditions: Secondary | ICD-10-CM | POA: Diagnosis not present

## 2021-08-11 DIAGNOSIS — R011 Cardiac murmur, unspecified: Secondary | ICD-10-CM | POA: Diagnosis not present

## 2021-08-11 DIAGNOSIS — R079 Chest pain, unspecified: Secondary | ICD-10-CM

## 2021-08-11 DIAGNOSIS — I5032 Chronic diastolic (congestive) heart failure: Secondary | ICD-10-CM | POA: Diagnosis not present

## 2021-08-11 DIAGNOSIS — E782 Mixed hyperlipidemia: Secondary | ICD-10-CM

## 2021-08-11 NOTE — Patient Instructions (Signed)
Medication Instructions:   Your physician recommends that you continue on your current medications as directed. Please refer to the Current Medication list given to you today.  *If you need a refill on your cardiac medications before your next appointment, please call your pharmacy*   Lab Work:  None today  If you have labs (blood work) drawn today and your tests are completely normal, you will receive your results only by: [place] (if you have MyChart) OR A paper copy in the mail If you have any lab test that is abnormal or we need to change your treatment, we will call you to review the results.   Testing/Procedures:  Your physician has requested that you have an echocardiogram. Echocardiography is a painless test that uses sound waves to create images of your heart. It provides your doctor with information about the size and shape of your heart and how well your heart's chambers and valves are working. This procedure takes approximately one hour. There are no restrictions for this procedure.   Your physician has requested that you have a lexiscan myoview. For further information please visit [email or web address]. Please follow instruction sheet, as given.    Follow-Up: At [facility], you and your health needs are our priority.  As part of our continuing mission to provide you with exceptional heart care, we have created designated Provider Care Teams.  These Care Teams include your primary Cardiologist (physician) and Advanced Practice Providers (APPs -  Physician Assistants and Nurse Practitioners) who all work together to provide you with the care you need, when you need it.  We recommend signing up for the patient portal called "MyChart".  Sign up information is provided on this After Visit Summary.  MyChart is used to connect with patients for Virtual Visits (Telemedicine).  Patients are able to view lab/test results, encounter notes, upcoming appointments, etc.   Non-urgent messages can be sent to your provider as well.   To learn more about what you can do with MyChart, go to [email or web address].    Your next appointment:  We will call you with results.

## 2021-08-11 NOTE — Progress Notes (Signed)
Cardiology Office Note  Date: 08/11/2021   ID: Catalina, Salasar 1940/12/17, MRN [id number]  PCP:  [name], MD  Cardiologist:  Rozann Lesches, MD Electrophysiologist:  None   Chief Complaint  Patient presents with   History of chest pain     History of Present Illness: [name] is an 80 y.o. female referred for cardiology consultation by Dr. Willey Blade for evaluation of chest pain.  She is here today with her daughter.  She tells me that she has had sporadic episodes of chest tightness, no obvious precipitant.  Her husband passed away earlier this year and she has been grieving his loss, but does not associate anxious or stressful feelings with her intermittent chest pain.  Also, she does not report any clear exertional component to her symptoms.  She is limited in terms of ambulation, uses a rolling walker.  She does not describe any palpitations with her chest pain. I reviewed ER records from June including lab work and imaging as noted below.  She was seen by Dr. Bronson Ing back in 2018 for evaluation of diastolic heart failure.  Her last echocardiogram was in January 2019 at which point she had LVEF in the range of 65 to 70% with moderate to severe LVH, grade 1 diastolic dysfunction, and mildly dilated left atrium with no major valvular abnormalities.  I reviewed her medications which are noted below.  She reports compliance with therapy and regular follow-up with Dr. Willey Blade.  Past Medical History:  Diagnosis Date   Arthritis    Chronic diastolic heart failure ([place])    Essential hypertension    GERD (gastroesophageal reflux disease)    Gout    Hiatal hernia    History of endometrial cancer    Sp hysterectomy 2012   Ischemic colitis ([place]) 2016   Myeloproliferative disorder, JAK-2 positive    Obesity    Pneumonia    Polycythemia vera(238.4)    TIA (transient ischemic attack)     Past Surgical History:  Procedure Laterality Date   ABDOMINAL HYSTERECTOMY   09/12/2011   Procedure: HYSTERECTOMY ABDOMINAL;  Surgeon: Janie Morning, MD;  [facility];  Service: Gynecology;  Laterality: N/A;  Total Abdominal Hysterectomy, Bilateral Salpingo Oophorectomy   BREAST LUMPECTOMY Left 07/10/2017   LEFT BREAST LUMPECTOMY WITH RADIOACTIVE SEED AND SENTINEL LYMPH NODE BIOPSY    BREAST LUMPECTOMY WITH RADIOACTIVE SEED AND SENTINEL LYMPH NODE BIOPSY Left 07/10/2017   Procedure: LEFT BREAST LUMPECTOMY WITH RADIOACTIVE SEED AND SENTINEL LYMPH NODE BIOPSY;  Surgeon: Rolm Bookbinder, MD;  Location: [place];  Service: General;  Laterality: Left;   COLONOSCOPY  07/05/2012   Procedure: COLONOSCOPY;  Surgeon: Rogene Houston, MD;  [facility];  Service: Endoscopy;  Laterality: N/A;  730   EYE SURGERY     bilateral 8 - 12 yrs ago   FLEXIBLE SIGMOIDOSCOPY N/A 04/03/2015   Procedure: FLEXIBLE SIGMOIDOSCOPY;  Surgeon: Rogene Houston, MD;  [facility];  Service: Endoscopy;  Laterality: N/A;   HYSTEROSCOPY WITH D & C  08/15/2011   Procedure: DILATATION AND CURETTAGE (D&C) /HYSTEROSCOPY;  Surgeon: Jonnie Kind, MD;  [facility];  Service: Gynecology;  Laterality: N/A;  With Suction Curette   JOINT REPLACEMENT     left knee replaced 2014   KNEE ARTHROPLASTY Left 03/15/2015   Procedure: COMPUTER ASSISTED TOTAL KNEE ARTHROPLASTY;  Surgeon: Marybelle Killings, MD;  Location: [place];  Service: Orthopedics;  Laterality: Left;   SALPINGOOPHORECTOMY  09/12/2011  Procedure: SALPINGO OOPHERECTOMY;  Surgeon: Janie Morning, MD;  [facility];  Service: Gynecology;  Laterality: Bilateral;    Current Outpatient Medications  Medication Sig Dispense Refill   aspirin EC 81 MG tablet Take 81 mg by mouth at bedtime.      carvedilol (COREG) 12.5 MG tablet Take 12.5 mg by mouth 2 (two) times daily.     cloNIDine (CATAPRES) 0.3 MG tablet Take 0.15 mg by mouth 2 (two) times daily.     diltiazem (CARDIZEM CD) 180 MG 24 hr capsule Take 180 mg by mouth daily.       esomeprazole (NEXIUM) 40 MG capsule Take 1 capsule (40 mg total) by mouth every morning. 30 capsule 11   furosemide (LASIX) 40 MG tablet Take 40 mg by mouth daily.     hydroxyurea (HYDREA) 500 MG capsule Take 2 tablets on Monday, Wednesday and Friday and 1 tablet daily rest of the week.  May take with food to minimize GI side effects. 90 capsule 3   letrozole (FEMARA) 2.5 MG tablet TAKE 1 TABLET BY MOUTH ONCE DAILY . APPOINTMENT REQUIRED FOR FUTURE REFILLS 30 tablet 6   losartan (COZAAR) 100 MG tablet Take 100 mg by mouth every evening.      Simethicone (GAS RELIEF PO) Take 1 tablet by mouth daily.     simvastatin (ZOCOR) 10 MG tablet Take 10 mg by mouth every evening.      Vitamin D, Ergocalciferol, (DRISDOL) 1.25 MG (50000 UNIT) CAPS capsule Take 1 capsule by mouth once a week 12 capsule 0   HYDROcodone-acetaminophen (NORCO/VICODIN) 5-325 MG tablet One tablet every four hours for pain. 30 tablet 0   levofloxacin (LEVAQUIN) 750 MG tablet Take 1 tablet (750 mg total) by mouth daily. (Patient not taking: Reported on 08/11/2021) 7 tablet 0   No current facility-administered medications for this visit.   Allergies:  Carbamazepine and Sulfa antibiotics   Social History: The patient  reports that she has never smoked. She has never used smokeless tobacco. She reports that she does not drink alcohol and does not use drugs.   Family History: The patient's family history includes Arthritis/Rheumatoid in her sister; Hypertension in her mother; Lung cancer in her father.   ROS: Palpitations or frank syncope.  Physical Exam: VS:  BP 138/68 (BP Location: Right Arm)   Pulse 70   Ht 5\' 4"  (1.626 m)   Wt 249 lb (112.9 kg)   SpO2 94%   BMI 42.74 kg/m , BMI Body mass index is 42.74 kg/m.  Wt Readings from Last 3 Encounters:  08/11/21 249 lb (112.9 kg)  05/30/21 243 lb 8 oz (110.5 kg)  04/25/21 238 lb 1.6 oz (108 kg)    General: Elderly woman, appears comfortable at rest. HEENT: Conjunctiva and  lids normal, wearing a mask. Neck: Supple, no elevated JVP or carotid bruits, no thyromegaly. Lungs: Clear to auscultation, nonlabored breathing at rest. Cardiac: Regular rate and rhythm, no S3, 3/6 systolic murmur, no pericardial rub. Abdomen: Soft, nontender, bowel sounds present. Extremities: No pitting edema, distal pulses 2+. Skin: Warm and dry. Musculoskeletal: No kyphosis. Neuropsychiatric: Alert and oriented x3, affect grossly appropriate.  ECG:  An ECG dated 04/25/2021 was personally reviewed today and demonstrated:  Sinus rhythm with right bundle branch block.  Recent Labwork: 04/25/2021: B Natriuretic Peptide 57.0 05/24/2021: ALT 12; AST 16; BUN 24; Creatinine, Ser 0.60; Hemoglobin 14.2; Platelets 407; Potassium 4.0; Sodium 136   Other Studies Reviewed Today:  Echocardiogram 11/10/2017: - Left ventricle: The  cavity size was normal. Wall thickness was    increased in a pattern of moderate to severe LVH. Systolic    function was vigorous. The estimated ejection fraction was in the    range of 65% to 70%. Wall motion was normal; there were no    regional wall motion abnormalities. Doppler parameters are    consistent with abnormal left ventricular relaxation (grade 1    diastolic dysfunction). Doppler parameters are consistent with    high ventricular filling pressure.  - Aortic valve: Mildly calcified annulus. Trileaflet; normal    thickness leaflets.  - Mitral valve: Mildly calcified annulus. Normal thickness leaflets.  - Left atrium: The atrium was mildly dilated.  - Technically difficult study.   Chest CTA 6/20/20202: FINDINGS: Cardiovascular: Thoracic aorta and its branches demonstrate atherosclerotic calcification without aneurysmal dilatation or dissection. No cardiac enlargement is seen. Large left atrial appendage is noted. Coronary calcifications are noted. Pulmonary artery shows a normal branching pattern. No filling defect to suggest pulmonary embolism is  noted.   Mediastinum/Nodes: Thoracic inlet is within normal limits. No sizable hilar or mediastinal adenopathy is noted. Moderate-sized hiatal hernia is noted.   Lungs/Pleura: Lungs are well aerated bilaterally with patchy areas of scarring stable in appearance from the prior exam. No sizable effusion or pneumothorax is noted. No sizable parenchymal nodule is seen. No significant edema is noted.   Upper Abdomen: Visualized upper abdomen shows right renal cyst. No other focal abnormality is noted.   Musculoskeletal: Degenerative changes of the thoracic spine are noted.   Review of the MIP images confirms the above findings.   IMPRESSION: No evidence of pulmonary emboli.   Mild scarring in the lungs bilaterally.   No acute abnormality noted.   Aortic Atherosclerosis (ICD10-I70.0).  Assessment and Plan:  1.  Episodic chest discomfort as discussed above in an 80 year old woman with prior history of chronic diastolic heart failure, essential hypertension, TIA, hyperlipidemia, and coronary as well as aortic calcification by CT imaging.  Symptoms are not purely exertional or necessarily associated with anxiety per discussion today.  I reviewed her testing from ER visit in June.  Cardiac enzymes were normal at that time.  ECG also showed sinus rhythm with right bundle branch block, no acute ST segment changes.  Plan is to obtain a [place] for ischemic evaluation.  2.  History of chronic diastolic heart failure, also heart murmur on examination.  She did have normal LVEF at 65 to 70% in 2019, moderate to severe LVH as well but does description of significant valvular heart disease.  We will obtain a follow-up echocardiogram.  Medication Adjustments/Labs and Tests Ordered: Current medicines are reviewed at length with the patient today.  Concerns regarding medicines are outlined above.   Tests Ordered: Orders Placed This Encounter  Procedures   NM Myocar Multi W/Spect W/Wall  Motion / EF   EKG 12-Lead   ECHOCARDIOGRAM COMPLETE     Medication Changes: No orders of the defined types were placed in this encounter.   Disposition:  Follow up  test results.  Signed, Satira Sark, MD, [facility] 08/11/2021 1:59 PM    [facility] at [facility] [address], [place], [place] [address] Phone: [phone]; Fax: [phone]

## 2021-08-16 ENCOUNTER — Encounter (HOSPITAL_COMMUNITY)
Admission: RE | Admit: 2021-08-16 | Discharge: 2021-08-16 | Disposition: A | Discharge status: Home / Self Care | Payer: Medicare Other | Source: Ambulatory Visit | Attending: Cardiology | Admitting: Cardiology

## 2021-08-16 ENCOUNTER — Other Ambulatory Visit: Payer: Self-pay

## 2021-08-16 ENCOUNTER — Ambulatory Visit (HOSPITAL_COMMUNITY)
Admission: RE | Admit: 2021-08-16 | Discharge: 2021-08-16 | Disposition: A | Discharge status: Home / Self Care | Payer: Medicare Other | Source: Ambulatory Visit | Attending: Cardiology | Admitting: Cardiology

## 2021-08-16 DIAGNOSIS — R079 Chest pain, unspecified: Secondary | ICD-10-CM | POA: Diagnosis not present

## 2021-08-16 LAB — NM MYOCAR MULTI W/SPECT W/WALL MOTION / EF
LV dias vol: 73 mL (ref 46–106)
LV sys vol: 21 mL
Nuc Stress EF: 71 %
Peak HR: 96 {beats}/min
RATE: 0.4
Rest HR: 83 {beats}/min
Rest Nuclear Isotope Dose: 10.8 mCi
SDS: 4
SRS: 6
SSS: 10
ST Depression (mm): 0 mm
Stress Nuclear Isotope Dose: 30 mCi
TID: 1.21

## 2021-08-16 MED ORDER — TECHNETIUM TC 99M TETROFOSMIN IV KIT
10.0000 | PACK | Freq: Once | INTRAVENOUS | Status: AC | PRN
  Administered 2021-08-16: 10.8 via INTRAVENOUS

## 2021-08-16 MED ORDER — TECHNETIUM TC 99M TETROFOSMIN IV KIT
30.0000 | PACK | Freq: Once | INTRAVENOUS | Status: AC | PRN
  Administered 2021-08-16: 30 via INTRAVENOUS

## 2021-08-16 MED ORDER — REGADENOSON 0.4 MG/5ML IV SOLN
INTRAVENOUS | Status: AC
  Administered 2021-08-16: 0.4 mg via INTRAVENOUS
  Filled 2021-08-16: qty 5

## 2021-08-16 MED ORDER — SODIUM CHLORIDE FLUSH 0.9 % IV SOLN
INTRAVENOUS | Status: AC
  Administered 2021-08-16: 10 mL via INTRAVENOUS
  Filled 2021-08-16: qty 10

## 2021-08-18 ENCOUNTER — Telehealth: Payer: Self-pay

## 2021-08-18 MED ORDER — ISOSORBIDE MONONITRATE ER 30 MG PO TB24: 15.0000 mg | ORAL_TABLET | Freq: Every day | ORAL | 3 refills | Status: DC

## 2021-08-18 NOTE — Telephone Encounter (Signed)
-----   Message from Satira Sark, MD sent at 08/16/2021  4:01 PM EDT ----- Results reviewed.  Please let her and her daughter know that stress testing is consistent with underlying ischemic heart disease/CAD and I suspect that at least some of her chest discomfort reported is angina.  Her medications are actually fairly reasonable already from the perspective of CAD, this does not necessarily mean that we need to pursue a cardiac catheterization as yet.  Suggest starting Imdur 15 mg once in the evening as additional antianginal therapy and scheduling a follow-up in the next 4 to 6 weeks to see how she is doing.

## 2021-08-18 NOTE — Telephone Encounter (Signed)
I spoke with daughter and discussed test results , Apt made with Dr. Domenic Polite 09/15/21 at 3 pm [place] office.

## 2021-08-25 DIAGNOSIS — L820 Inflamed seborrheic keratosis: Secondary | ICD-10-CM | POA: Diagnosis not present

## 2021-09-02 DIAGNOSIS — Z96652 Presence of left artificial knee joint: Secondary | ICD-10-CM | POA: Diagnosis not present

## 2021-09-02 DIAGNOSIS — J449 Chronic obstructive pulmonary disease, unspecified: Secondary | ICD-10-CM | POA: Diagnosis not present

## 2021-09-02 DIAGNOSIS — J9601 Acute respiratory failure with hypoxia: Secondary | ICD-10-CM | POA: Diagnosis not present

## 2021-09-13 ENCOUNTER — Other Ambulatory Visit: Payer: Self-pay

## 2021-09-13 ENCOUNTER — Ambulatory Visit (HOSPITAL_COMMUNITY)
Admission: RE | Admit: 2021-09-13 | Discharge: 2021-09-13 | Disposition: A | Discharge status: Home / Self Care | Payer: Medicare Other | Source: Ambulatory Visit | Attending: Cardiology | Admitting: Cardiology

## 2021-09-13 DIAGNOSIS — R011 Cardiac murmur, unspecified: Secondary | ICD-10-CM | POA: Diagnosis not present

## 2021-09-13 LAB — ECHOCARDIOGRAM COMPLETE
AR max vel: 1.83 cm2
AV Area VTI: 2.11 cm2
AV Area mean vel: 2.02 cm2
AV Mean grad: 10 mmHg
AV Peak grad: 22.7 mmHg
Ao pk vel: 2.38 m/s
Area-P 1/2: 3.11 cm2
Calc EF: 54.9 %
MV VTI: 2.51 cm2
S' Lateral: 2.4 cm
Single Plane A2C EF: 46.8 %
Single Plane A4C EF: 63.6 %

## 2021-09-13 NOTE — Progress Notes (Signed)
*  PRELIMINARY RESULTS* Echocardiogram 2D Echocardiogram has been performed.  [name] 09/13/2021, 3:53 PM

## 2021-09-15 ENCOUNTER — Encounter: Payer: Self-pay | Admitting: Cardiology

## 2021-09-15 ENCOUNTER — Ambulatory Visit: Payer: Medicare Other | Admitting: Cardiology

## 2021-09-15 ENCOUNTER — Other Ambulatory Visit: Payer: Self-pay

## 2021-09-15 VITALS — BP 158/70 | HR 74 | Ht 64.0 in | Wt 248.8 lb

## 2021-09-15 DIAGNOSIS — I38 Endocarditis, valve unspecified: Secondary | ICD-10-CM | POA: Diagnosis not present

## 2021-09-15 DIAGNOSIS — I259 Chronic ischemic heart disease, unspecified: Secondary | ICD-10-CM

## 2021-09-15 DIAGNOSIS — Z79899 Other long term (current) drug therapy: Secondary | ICD-10-CM | POA: Diagnosis not present

## 2021-09-15 MED ORDER — FUROSEMIDE 40 MG PO TABS: 60.0000 mg | ORAL_TABLET | Freq: Every day | ORAL | 3 refills | Status: DC

## 2021-09-15 NOTE — Progress Notes (Signed)
Cardiology Office Note  Date: 09/15/2021   ID: [name] 1940/12/09, MRN [id number]  PCP:  [name], MD  Cardiologist:  Rozann Lesches, MD Electrophysiologist:  None   Chief Complaint  Patient presents with   Cardiac follow-up    History of Present Illness: [name] is an 80 y.o. female last seen in October.  She is here with her daughter.  We discussed the results of her recent cardiac testing.  Lexiscan Myoview in October was abnormal and consistent with ischemia in the inferolateral distribution.  Imdur 15 mg was started in addition to her baseline regimen, she has actually been taking 30 mg daily and tolerating it well..  Recent echocardiogram demonstrated vigorous LVEF at 65 to 70%.  She does have severe pulmonary hypertension with RVSP estimated 67 mmHg.  Left atrium is severely dilated.  There is evidence of mild to moderate mitral stenosis with moderately calcified annulus, also mild aortic stenosis with mean gradient 10 mmHg.  Suspect that her elevated pulmonary pressures are a function of valvular heart disease and diastolic dysfunction.  We discussed plan to manage medically as long as symptoms do not escalate substantially.  In general she does not want to pursue any aggressive invasive cardiac work-up which I think is reasonable.  We went over her medications which are listed below.  Past Medical History:  Diagnosis Date   Arthritis    Chronic diastolic heart failure ([place])    Essential hypertension    GERD (gastroesophageal reflux disease)    Gout    Hiatal hernia    History of endometrial cancer    Sp hysterectomy 2012   Ischemic colitis ([place]) 2016   Myeloproliferative disorder, JAK-2 positive    Obesity    Pneumonia    Polycythemia vera(238.4)    TIA (transient ischemic attack)     Past Surgical History:  Procedure Laterality Date   ABDOMINAL HYSTERECTOMY  09/12/2011   Procedure: HYSTERECTOMY ABDOMINAL;  Surgeon: Janie Morning, MD;   [facility];  Service: Gynecology;  Laterality: N/A;  Total Abdominal Hysterectomy, Bilateral Salpingo Oophorectomy   BREAST LUMPECTOMY Left 07/10/2017   LEFT BREAST LUMPECTOMY WITH RADIOACTIVE SEED AND SENTINEL LYMPH NODE BIOPSY    BREAST LUMPECTOMY WITH RADIOACTIVE SEED AND SENTINEL LYMPH NODE BIOPSY Left 07/10/2017   Procedure: LEFT BREAST LUMPECTOMY WITH RADIOACTIVE SEED AND SENTINEL LYMPH NODE BIOPSY;  Surgeon: Rolm Bookbinder, MD;  Location: [place];  Service: General;  Laterality: Left;   COLONOSCOPY  07/05/2012   Procedure: COLONOSCOPY;  Surgeon: Rogene Houston, MD;  [facility];  Service: Endoscopy;  Laterality: N/A;  730   EYE SURGERY     bilateral 8 - 12 yrs ago   FLEXIBLE SIGMOIDOSCOPY N/A 04/03/2015   Procedure: FLEXIBLE SIGMOIDOSCOPY;  Surgeon: Rogene Houston, MD;  [facility];  Service: Endoscopy;  Laterality: N/A;   HYSTEROSCOPY WITH D & C  08/15/2011   Procedure: DILATATION AND CURETTAGE (D&C) /HYSTEROSCOPY;  Surgeon: Jonnie Kind, MD;  [facility];  Service: Gynecology;  Laterality: N/A;  With Suction Curette   JOINT REPLACEMENT     left knee replaced 2014   KNEE ARTHROPLASTY Left 03/15/2015   Procedure: COMPUTER ASSISTED TOTAL KNEE ARTHROPLASTY;  Surgeon: Marybelle Killings, MD;  Location: [place];  Service: Orthopedics;  Laterality: Left;   SALPINGOOPHORECTOMY  09/12/2011   Procedure: SALPINGO OOPHERECTOMY;  Surgeon: Janie Morning, MD;  [facility];  Service: Gynecology;  Laterality: Bilateral;  Current Outpatient Medications  Medication Sig Dispense Refill   aspirin EC 81 MG tablet Take 81 mg by mouth at bedtime.      carvedilol (COREG) 12.5 MG tablet Take 12.5 mg by mouth 2 (two) times daily with a meal. Take 1/2 tablet by 2 times daily     cloNIDine (CATAPRES) 0.3 MG tablet Take 0.15 mg by mouth 2 (two) times daily.     diltiazem (CARDIZEM CD) 180 MG 24 hr capsule Take 180 mg by mouth daily.      doxylamine, Sleep, (SLEEP AID) 25 MG  tablet Take 25 mg by mouth at bedtime as needed.     esomeprazole (NEXIUM) 40 MG capsule Take 1 capsule (40 mg total) by mouth every morning. 30 capsule 11   hydroxyurea (HYDREA) 500 MG capsule Take 2 tablets on Monday, Wednesday and Friday and 1 tablet daily rest of the week.  May take with food to minimize GI side effects. 90 capsule 3   isosorbide mononitrate (IMDUR) 30 MG 24 hr tablet Take 30 mg by mouth daily.     letrozole (FEMARA) 2.5 MG tablet TAKE 1 TABLET BY MOUTH ONCE DAILY . APPOINTMENT REQUIRED FOR FUTURE REFILLS 30 tablet 6   levofloxacin (LEVAQUIN) 750 MG tablet Take 1 tablet (750 mg total) by mouth daily. 7 tablet 0   losartan (COZAAR) 100 MG tablet Take 100 mg by mouth every evening.      Multiple Vitamins-Minerals (PRESERVISION AREDS PO) Take by mouth.     Simethicone (GAS RELIEF PO) Take 1 tablet by mouth daily.     simvastatin (ZOCOR) 10 MG tablet Take 10 mg by mouth every evening.      Vitamin D, Ergocalciferol, (DRISDOL) 1.25 MG (50000 UNIT) CAPS capsule Take 1 capsule by mouth once a week 12 capsule 0   furosemide (LASIX) 40 MG tablet Take 1.5 tablets (60 mg total) by mouth daily. 45 tablet 3   No current facility-administered medications for this visit.   Allergies:  Carbamazepine and Sulfa antibiotics   ROS: No syncope.  Uses a rolling walker.  Physical Exam: VS:  BP (!) 158/70 (BP Location: Right Arm, Patient Position: Sitting, Cuff Size: Large)   Pulse 74   Ht 5\' 4"  (1.626 m)   Wt 248 lb 12.8 oz (112.9 kg)   SpO2 93%   BMI 42.71 kg/m , BMI Body mass index is 42.71 kg/m.  Wt Readings from Last 3 Encounters:  09/15/21 248 lb 12.8 oz (112.9 kg)  08/11/21 249 lb (112.9 kg)  05/30/21 243 lb 8 oz (110.5 kg)    General: Patient appears comfortable at rest. HEENT: Conjunctiva and lids normal, wearing a mask. Neck: Supple, no elevated JVP or carotid bruits, no thyromegaly. Lungs: Clear to auscultation, nonlabored breathing at rest. Cardiac: Regular rate and  rhythm, no S3, 3/6 systolic murmur, no pericardial rub. Extremities: Chronic appearing edema/adipose tissue.  ECG:  An ECG dated 04/25/2021 was personally reviewed today and demonstrated:  Sinus rhythm with right bundle branch block.  Recent Labwork: 04/25/2021: B Natriuretic Peptide 57.0 05/24/2021: ALT 12; AST 16; BUN 24; Creatinine, Ser 0.60; Hemoglobin 14.2; Platelets 407; Potassium 4.0; Sodium 136   Other Studies Reviewed Today:  Lexiscan Myoview 08/16/2021: Findings are consistent with ischemia. The study is intermediate risk given TID of 1.21 and inferolateral partially reversible defect.   The study is intermediate risk.   No ST deviation was noted.   LV perfusion is abnormal. There is evidence of ischemia in the inferolateral/apical region. There  is no evidence of infarction.   Defect 1: There is a medium defect with moderate reduction in uptake present in the apical to basal inferolateral location(s) that is partially reversible. There is normal wall motion in the defect area. Consistent with ischemia.   Left ventricular function is normal. End diastolic cavity size is normal. Evidence of transient ischemic dilation (TID) noted. TID was appreciated quantitatively but not visually.  Echocardiogram 09/13/2021:  1. Left ventricular ejection fraction, by estimation, is 65 to 70%. The  left ventricle has normal function. The left ventricle has no regional  wall motion abnormalities. There is moderate left ventricular hypertrophy.  Left ventricular diastolic  parameters are indeterminate.   2. Right ventricular systolic function is normal. The right ventricular  size is normal. There is severely elevated pulmonary artery systolic  pressure.   3. Left atrial size was severely dilated.   4. The mitral valve is abnormal. No evidence of mitral valve  regurgitation. Mild to moderate mitral stenosis. Moderate mitral annular  calcification.   5. The tricuspid valve is abnormal.   6. The  aortic valve is tricuspid. There is moderate calcification of the  aortic valve. There is moderate thickening of the aortic valve. Aortic  valve regurgitation is not visualized. Mild aortic valve stenosis. Aortic  valve mean gradient measures 10.0  mmHg. Aortic valve peak gradient measures 22.7 mmHg. Aortic valve area, by  VTI measures 2.11 cm   7. The inferior vena cava is dilated in size with >50% respiratory  variability, suggesting right atrial pressure of 8 mmHg.   Assessment and Plan:  1.  Ischemic heart disease based on Myoview with evidence of inferolateral ischemia which we will manage medically at this point.  She does not report any progressive symptoms since initial encounter.  Continue aspirin, Zocor, losartan, Coreg, and Imdur.  2.  Valvular heart disease including mild to moderate mitral stenosis and mild aortic stenosis.  She has severely elevated pulmonary pressures which are likely a function of valvular heart disease along with diastolic dysfunction.  Continue observation, will increase Lasix to 60 mg daily, recheck a BMET in 2 weeks.  Currently not on a potassium supplement.  Medication Adjustments/Labs and Tests Ordered: Current medicines are reviewed at length with the patient today.  Concerns regarding medicines are outlined above.   Tests Ordered: Orders Placed This Encounter  Procedures   Basic metabolic panel     Medication Changes: Meds ordered this encounter  Medications   furosemide (LASIX) 40 MG tablet    Sig: Take 1.5 tablets (60 mg total) by mouth daily.    Dispense:  45 tablet    Refill:  3    09/15/2021 dose increase     Disposition:  Follow up  3 months.  Signed, Satira Sark, MD, [facility] 09/15/2021 3:10 PM    [facility] [place], [place], [place] [address] Phone: [phone]; Fax: [phone]

## 2021-09-15 NOTE — Patient Instructions (Addendum)
Medication Instructions:  Your physician has recommended you make the following change in your medication:  Increase furosemide to 60 mg daily Continue other medications the same  Labwork: BMET at [facility] on 10/04/21 with other lab work  Testing/Procedures: none  Follow-Up: Your physician recommends that you schedule a follow-up appointment in: 3 months  Any Other Special Instructions Will Be Listed Below (If Applicable).  If you need a refill on your cardiac medications before your next appointment, please call your pharmacy.

## 2021-09-22 ENCOUNTER — Other Ambulatory Visit (HOSPITAL_COMMUNITY): Payer: Self-pay | Admitting: Physician Assistant

## 2021-09-22 ENCOUNTER — Encounter (HOSPITAL_COMMUNITY): Payer: Self-pay

## 2021-09-22 NOTE — Progress Notes (Signed)
Patient's daughter contacted clinic today to inquire about hematologic precautions before anupcoming tooth extraction.  Patient's daughter was advised that the patient can hold her hydroxyurea 2 days prior to her tooth extraction and resume it 1 week after extraction, due to delayed wound healing that can occur from hydroxyurea.

## 2021-09-24 ENCOUNTER — Other Ambulatory Visit (HOSPITAL_COMMUNITY): Payer: Self-pay | Admitting: Hematology

## 2021-09-24 DIAGNOSIS — C50412 Malignant neoplasm of upper-outer quadrant of left female breast: Secondary | ICD-10-CM

## 2021-09-24 DIAGNOSIS — Z170 Estrogen receptor positive status [ER+]: Secondary | ICD-10-CM

## 2021-09-27 ENCOUNTER — Ambulatory Visit (INDEPENDENT_AMBULATORY_CARE_PROVIDER_SITE_OTHER): Payer: Medicare Other | Admitting: Ophthalmology

## 2021-09-27 ENCOUNTER — Other Ambulatory Visit: Payer: Self-pay

## 2021-09-27 ENCOUNTER — Encounter (INDEPENDENT_AMBULATORY_CARE_PROVIDER_SITE_OTHER): Payer: Self-pay | Admitting: Ophthalmology

## 2021-09-27 DIAGNOSIS — H353132 Nonexudative age-related macular degeneration, bilateral, intermediate dry stage: Secondary | ICD-10-CM | POA: Diagnosis not present

## 2021-09-27 DIAGNOSIS — H353221 Exudative age-related macular degeneration, left eye, with active choroidal neovascularization: Secondary | ICD-10-CM | POA: Diagnosis not present

## 2021-09-27 DIAGNOSIS — H35722 Serous detachment of retinal pigment epithelium, left eye: Secondary | ICD-10-CM | POA: Diagnosis not present

## 2021-09-27 MED ORDER — BEVACIZUMAB 2.5 MG/0.1ML IZ SOSY
2.5000 mg | PREFILLED_SYRINGE | INTRAVITREAL | Status: AC | PRN
  Administered 2021-09-27: 2.5 mg via INTRAVITREAL

## 2021-09-27 NOTE — Assessment & Plan Note (Signed)
No sign of CNVM OD 

## 2021-09-27 NOTE — Assessment & Plan Note (Signed)
OS with persistent subretinal fluid and subretinal hyper reflective material in the fovea could be accounting for acuity.  Today at 10-week follow-up interval.  We will repeat injection OS today Avastin and shorten interval follow-up to 7-week

## 2021-09-27 NOTE — Progress Notes (Signed)
09/27/2021     CHIEF COMPLAINT Patient presents for  Chief Complaint  Patient presents with   Retina Follow Up    9 week fu Avastin OS Pt states VA OU stable since last visit. Pt denies FOL, floaters, or ocular pain OU.         HISTORY OF PRESENT ILLNESS: [name] is a 80 y.o. female who presents to the clinic today for:   HPI     Retina Follow Up   Patient presents with  Wet AMD.  In left eye.  This started 10 weeks ago.  Severity is mild.  Duration of 10 weeks.  Since onset it is stable. Additional comments: 9 week fu Avastin OS Pt states VA OU stable since last visit. Pt denies FOL, floaters, or ocular pain OU.           Comments   10 week fu OU oct avastin OS. Patient states "when I am watching t.v. I see a blur when reading the writing on the t.v." Denies any new floaters or FOL.       Last edited by Laurin Coder on 09/27/2021  1:44 PM.      Referring physician: [name], MD [address] [place],  [place] [address]  HISTORICAL INFORMATION:   Selected notes from the MEDICAL RECORD NUMBER       CURRENT MEDICATIONS: No current outpatient medications on file. (Ophthalmic Drugs)   No current facility-administered medications for this visit. (Ophthalmic Drugs)   Current Outpatient Medications (Other)  Medication Sig   aspirin EC 81 MG tablet Take 81 mg by mouth at bedtime.    carvedilol (COREG) 12.5 MG tablet Take 12.5 mg by mouth 2 (two) times daily with a meal. Take 1/2 tablet by 2 times daily   cloNIDine (CATAPRES) 0.3 MG tablet Take 0.15 mg by mouth 2 (two) times daily.   diltiazem (CARDIZEM CD) 180 MG 24 hr capsule Take 180 mg by mouth daily.    doxylamine, Sleep, (SLEEP AID) 25 MG tablet Take 25 mg by mouth at bedtime as needed.   esomeprazole (NEXIUM) 40 MG capsule Take 1 capsule (40 mg total) by mouth every morning.   furosemide (LASIX) 40 MG tablet Take 1.5 tablets (60 mg total) by mouth daily.   hydroxyurea (HYDREA) 500  MG capsule Take 2 tablets on Monday, Wednesday and Friday and 1 tablet daily rest of the week.  May take with food to minimize GI side effects.   isosorbide mononitrate (IMDUR) 30 MG 24 hr tablet Take 30 mg by mouth daily.   letrozole (FEMARA) 2.5 MG tablet TAKE 1 TABLET BY MOUTH ONCE DAILY . APPOINTMENT REQUIRED FOR FUTURE REFILLS   levofloxacin (LEVAQUIN) 750 MG tablet Take 1 tablet (750 mg total) by mouth daily.   losartan (COZAAR) 100 MG tablet Take 100 mg by mouth every evening.    Multiple Vitamins-Minerals (PRESERVISION AREDS PO) Take by mouth.   Simethicone (GAS RELIEF PO) Take 1 tablet by mouth daily.   simvastatin (ZOCOR) 10 MG tablet Take 10 mg by mouth every evening.    Vitamin D, Ergocalciferol, (DRISDOL) 1.25 MG (50000 UNIT) CAPS capsule Take 1 capsule by mouth once a week   No current facility-administered medications for this visit. (Other)      REVIEW OF SYSTEMS:    ALLERGIES Allergies  Allergen Reactions   Carbamazepine Hives and Other (See Comments)    headache   Sulfa Antibiotics     Rash  PAST MEDICAL HISTORY Past Medical History:  Diagnosis Date   Arthritis    Chronic diastolic heart failure ([place])    Essential hypertension    GERD (gastroesophageal reflux disease)    Gout    Hiatal hernia    History of endometrial cancer    Sp hysterectomy 2012   Ischemic colitis ([place]) 2016   Myeloproliferative disorder, JAK-2 positive    Obesity    Pneumonia    Polycythemia vera(238.4)    TIA (transient ischemic attack)    Past Surgical History:  Procedure Laterality Date   ABDOMINAL HYSTERECTOMY  09/12/2011   Procedure: HYSTERECTOMY ABDOMINAL;  Surgeon: Janie Morning, MD;  Location: [facility];  Service: Gynecology;  Laterality: N/A;  Total Abdominal Hysterectomy, Bilateral Salpingo Oophorectomy   BREAST LUMPECTOMY Left 07/10/2017   LEFT BREAST LUMPECTOMY WITH RADIOACTIVE SEED AND SENTINEL LYMPH NODE BIOPSY    BREAST LUMPECTOMY WITH RADIOACTIVE SEED AND  SENTINEL LYMPH NODE BIOPSY Left 07/10/2017   Procedure: LEFT BREAST LUMPECTOMY WITH RADIOACTIVE SEED AND SENTINEL LYMPH NODE BIOPSY;  Surgeon: Rolm Bookbinder, MD;  Location: [facility];  Service: General;  Laterality: Left;   COLONOSCOPY  07/05/2012   Procedure: COLONOSCOPY;  Surgeon: Rogene Houston, MD;  Location: [facility];  Service: Endoscopy;  Laterality: N/A;  730   EYE SURGERY     bilateral 8 - 12 yrs ago   FLEXIBLE SIGMOIDOSCOPY N/A 04/03/2015   Procedure: FLEXIBLE SIGMOIDOSCOPY;  Surgeon: Rogene Houston, MD;  Location: [facility];  Service: Endoscopy;  Laterality: N/A;   HYSTEROSCOPY WITH D & C  08/15/2011   Procedure: DILATATION AND CURETTAGE (D&C) /HYSTEROSCOPY;  Surgeon: Jonnie Kind, MD;  Location: [facility];  Service: Gynecology;  Laterality: N/A;  With Suction Curette   JOINT REPLACEMENT     left knee replaced 2014   KNEE ARTHROPLASTY Left 03/15/2015   Procedure: COMPUTER ASSISTED TOTAL KNEE ARTHROPLASTY;  Surgeon: Marybelle Killings, MD;  Location: [facility];  Service: Orthopedics;  Laterality: Left;   SALPINGOOPHORECTOMY  09/12/2011   Procedure: SALPINGO OOPHERECTOMY;  Surgeon: Janie Morning, MD;  Location: [facility];  Service: Gynecology;  Laterality: Bilateral;    FAMILY HISTORY Family History  Problem Relation Age of Onset   Hypertension Mother    Lung cancer Father    Arthritis/Rheumatoid Sister    Anesthesia problems Neg Hx    Hypotension Neg Hx    Malignant hyperthermia Neg Hx    Pseudochol deficiency Neg Hx     SOCIAL HISTORY Social History   Tobacco Use   Smoking status: Never   Smokeless tobacco: Never  Vaping Use   Vaping Use: Never used  Substance Use Topics   Alcohol use: No   Drug use: No         OPHTHALMIC EXAM:  Base Eye Exam     Visual Acuity (ETDRS)       Right Left   Dist [place] 20/30 -1 20/50 -2   Dist ph [place] 20/25 -2 NI         Tonometry (Tonopen, 1:47 PM)       Right Left   Pressure 4 6         Pupils       Pupils Dark Light APD    Right PERRL 3 2 None   Left PERRL 3 2 None         Visual Fields (Counting fingers)       Left Right    Full Full  Extraocular Movement       Right Left    Full Full         Neuro/Psych     Oriented x3: Yes   Mood/Affect: Normal         Dilation     Both eyes: 1.0% Mydriacyl, 2.5% Phenylephrine @ 1:47 PM           Slit Lamp and Fundus Exam     External Exam       Right Left   External Normal Normal         Slit Lamp Exam       Right Left   Lids/Lashes Normal Normal   Conjunctiva/Sclera White and quiet White and quiet   Cornea Clear Clear   Anterior Chamber Deep and quiet Deep and quiet   Iris Round and reactive Round and reactive   Lens Posterior chamber intraocular lens Posterior chamber intraocular lens   Anterior Vitreous Normal Normal         Fundus Exam       Right Left   Posterior Vitreous Posterior vitreous detachment Posterior vitreous detachment   Disc Normal.  Normal   C/D Ratio 0.15 0.25   Macula Hard drusen, no hemorrhage, no macular thickening no macular thickening clinically, , Retinal pigment epithelial mottling, Hard drusen, no exudates, Pigmented atrophy   Vessels Normal Normal   Periphery Subretinal hemorrhage, along the superotemporal arcade Normal            IMAGING AND PROCEDURES  Imaging and Procedures for 09/27/21  Intravitreal Injection, Pharmacologic Agent - OS - Left Eye       Time Out 09/27/2021. 2:15 PM. Confirmed correct patient, procedure, site, and patient consented.   Anesthesia Topical anesthesia was used. Anesthetic medications included Lidocaine 4%.   Procedure Preparation included Tobramycin 0.3%, 10% betadine to eyelids, 5% betadine to ocular surface. A 30 gauge needle was used.   Injection: 2.5 mg bevacizumab 2.5 MG/0.1ML   Route: Intravitreal, Site: Left Eye   NDC: [phone], Lot: [id number]   Post-op Post injection exam found visual acuity of at least counting fingers.  The patient tolerated the procedure well. There were no complications. The patient received written and verbal post procedure care education. Post injection medications included ocuflox.      OCT, Retina - OU - Both Eyes       Right Eye Quality was good. Scan locations included subfoveal. Central Foveal Thickness: 256. Progression has been stable. Findings include no SRF, no IRF.   Left Eye Quality was good. Scan locations included subfoveal. Central Foveal Thickness: 261. Progression has improved. Findings include no IRF, subretinal fluid, abnormal foveal contour.   Notes OS today at 10 weeks post injection we planned for repeat injection today, with still with subretinal fluid active but much less today at this longer interval.  Repeat injection today and decrease interval examination OS next 8 weeks OD today now 8 months post last injection stable no sign of CNVM             ASSESSMENT/PLAN:  Exudative age-related macular degeneration of left eye with active choroidal neovascularization (HCC) OS with persistent subretinal fluid and subretinal hyper reflective material in the fovea could be accounting for acuity.  Today at 10-week follow-up interval.  We will repeat injection OS today Avastin and shorten interval follow-up to 7-week  Intermediate stage nonexudative age-related macular degeneration of both eyes No sign of CNVM OD  Serous detachment of retinal pigment epithelium of left  eye Component of wet AMD OS slightly active still persistent at 10-week interval today     ICD-10-CM   1. Exudative age-related macular degeneration of left eye with active choroidal neovascularization (HCC)  H35.3221 Intravitreal Injection, Pharmacologic Agent - OS - Left Eye    OCT, Retina - OU - Both Eyes    bevacizumab (AVASTIN) SOSY 2.5 mg    2. Intermediate stage nonexudative age-related macular degeneration of both eyes  H35.3132     3. Serous detachment of retinal pigment epithelium  of left eye  H35.722       1.  OS, chronic active disease subfoveal with subretinal fluid at 10-week interval.  We will repeat injection today to reestablish control of disease shorten follow-up interval next 7  2.  3.  Ophthalmic Meds Ordered this visit:  Meds ordered this encounter  Medications   bevacizumab (AVASTIN) SOSY 2.5 mg       Return in about 7 weeks (around 11/15/2021) for dilate, OS, AVASTIN OCT.  There are no Patient Instructions on file for this visit.   Explained the diagnoses, plan, and follow up with the patient and they expressed understanding.  Patient expressed understanding of the importance of proper follow up care.   Clent Demark Kattleya Kuhnert M.D. Diseases & Surgery of the Retina and Vitreous [facility] [place] 09/27/21     Abbreviations: M myopia (nearsighted); A astigmatism; H hyperopia (farsighted); P presbyopia; Mrx spectacle prescription;  CTL contact lenses; OD right eye; OS left eye; OU both eyes  XT exotropia; ET esotropia; PEK punctate epithelial keratitis; PEE punctate epithelial erosions; DES dry eye syndrome; MGD meibomian gland dysfunction; ATs artificial tears; PFAT's preservative free artificial tears; [place] nuclear sclerotic cataract; PSC posterior subcapsular cataract; ERM epi-retinal membrane; PVD posterior vitreous detachment; RD retinal detachment; DM diabetes mellitus; DR diabetic retinopathy; NPDR non-proliferative diabetic retinopathy; PDR proliferative diabetic retinopathy; CSME clinically significant macular edema; DME diabetic macular edema; dbh dot blot hemorrhages; CWS cotton wool spot; POAG primary open angle glaucoma; C/D cup-to-disc ratio; HVF humphrey visual field; GVF goldmann visual field; OCT optical coherence tomography; IOP intraocular pressure; BRVO Branch retinal vein occlusion; CRVO central retinal vein occlusion; CRAO central retinal artery occlusion; BRAO branch retinal artery occlusion; RT retinal tear; SB scleral  buckle; PPV pars plana vitrectomy; VH Vitreous hemorrhage; PRP panretinal laser photocoagulation; IVK intravitreal kenalog; VMT vitreomacular traction; MH Macular hole;  NVD neovascularization of the disc; NVE neovascularization elsewhere; AREDS age related eye disease study; ARMD age related macular degeneration; POAG primary open angle glaucoma; EBMD epithelial/anterior basement membrane dystrophy; ACIOL anterior chamber intraocular lens; IOL intraocular lens; PCIOL posterior chamber intraocular lens; Phaco/IOL phacoemulsification with intraocular lens placement; [place]tt photorefractive keratectomy; LASIK laser assisted in situ keratomileusis; HTN hypertension; DM diabetes mellitus; COPD chronic obstructive pulmonary disease

## 2021-09-27 NOTE — Assessment & Plan Note (Signed)
Component of wet AMD OS slightly active still persistent at 10-week interval today

## 2021-10-03 ENCOUNTER — Inpatient Hospital Stay (HOSPITAL_COMMUNITY): Payer: Medicare Other | Attending: Hematology

## 2021-10-03 ENCOUNTER — Other Ambulatory Visit (HOSPITAL_COMMUNITY)
Admission: RE | Admit: 2021-10-03 | Discharge: 2021-10-03 | Disposition: A | Discharge status: Home / Self Care | Payer: Medicare Other | Source: Ambulatory Visit | Attending: Cardiology | Admitting: Cardiology

## 2021-10-03 DIAGNOSIS — Z79899 Other long term (current) drug therapy: Secondary | ICD-10-CM

## 2021-10-03 DIAGNOSIS — J449 Chronic obstructive pulmonary disease, unspecified: Secondary | ICD-10-CM | POA: Diagnosis not present

## 2021-10-03 DIAGNOSIS — I259 Chronic ischemic heart disease, unspecified: Secondary | ICD-10-CM | POA: Insufficient documentation

## 2021-10-03 DIAGNOSIS — C50412 Malignant neoplasm of upper-outer quadrant of left female breast: Secondary | ICD-10-CM | POA: Diagnosis not present

## 2021-10-03 DIAGNOSIS — Z170 Estrogen receptor positive status [ER+]: Secondary | ICD-10-CM | POA: Diagnosis not present

## 2021-10-03 DIAGNOSIS — J9601 Acute respiratory failure with hypoxia: Secondary | ICD-10-CM | POA: Diagnosis not present

## 2021-10-03 DIAGNOSIS — Z8639 Personal history of other endocrine, nutritional and metabolic disease: Secondary | ICD-10-CM | POA: Insufficient documentation

## 2021-10-03 DIAGNOSIS — Z96652 Presence of left artificial knee joint: Secondary | ICD-10-CM | POA: Diagnosis not present

## 2021-10-03 DIAGNOSIS — D473 Essential (hemorrhagic) thrombocythemia: Secondary | ICD-10-CM | POA: Insufficient documentation

## 2021-10-03 LAB — CBC WITH DIFFERENTIAL/PLATELET
Abs Immature Granulocytes: 0.02 10*3/uL (ref 0.00–0.07)
Basophils Absolute: 0.1 10*3/uL (ref 0.0–0.1)
Basophils Relative: 1 %
Eosinophils Absolute: 0.3 10*3/uL (ref 0.0–0.5)
Eosinophils Relative: 4 %
HCT: 46.7 % — ABNORMAL HIGH (ref 36.0–46.0)
Hemoglobin: 15.1 g/dL — ABNORMAL HIGH (ref 12.0–15.0)
Immature Granulocytes: 0 %
Lymphocytes Relative: 22 %
Lymphs Abs: 1.7 10*3/uL (ref 0.7–4.0)
MCH: 33.6 pg (ref 26.0–34.0)
MCHC: 32.3 g/dL (ref 30.0–36.0)
MCV: 104 fL — ABNORMAL HIGH (ref 80.0–100.0)
Monocytes Absolute: 0.6 10*3/uL (ref 0.1–1.0)
Monocytes Relative: 8 %
Neutro Abs: 5 10*3/uL (ref 1.7–7.7)
Neutrophils Relative %: 65 %
Platelets: 447 10*3/uL — ABNORMAL HIGH (ref 150–400)
RBC: 4.49 MIL/uL (ref 3.87–5.11)
RDW: 13.2 % (ref 11.5–15.5)
WBC: 7.7 10*3/uL (ref 4.0–10.5)
nRBC: 0 % (ref 0.0–0.2)

## 2021-10-03 LAB — COMPREHENSIVE METABOLIC PANEL
ALT: 11 U/L (ref 0–44)
AST: 18 U/L (ref 15–41)
Albumin: 4 g/dL (ref 3.5–5.0)
Alkaline Phosphatase: 89 U/L (ref 38–126)
Anion gap: 5 (ref 5–15)
BUN: 20 mg/dL (ref 8–23)
CO2: 30 mmol/L (ref 22–32)
Calcium: 9.2 mg/dL (ref 8.9–10.3)
Chloride: 103 mmol/L (ref 98–111)
Creatinine, Ser: 0.61 mg/dL (ref 0.44–1.00)
GFR, Estimated: >60 mL/min (ref >60)
Glucose, Bld: 112 mg/dL — ABNORMAL HIGH (ref 70–99)
Potassium: 4.3 mmol/L (ref 3.5–5.1)
Sodium: 138 mmol/L (ref 135–145)
Total Bilirubin: 0.6 mg/dL (ref 0.3–1.2)
Total Protein: 7 g/dL (ref 6.5–8.1)

## 2021-10-03 LAB — LACTATE DEHYDROGENASE: LDH: 148 U/L (ref 98–192)

## 2021-10-03 LAB — BASIC METABOLIC PANEL
Anion gap: 5 (ref 5–15)
BUN: 21 mg/dL (ref 8–23)
CO2: 31 mmol/L (ref 22–32)
Calcium: 9.2 mg/dL (ref 8.9–10.3)
Chloride: 103 mmol/L (ref 98–111)
Creatinine, Ser: 0.65 mg/dL (ref 0.44–1.00)
GFR, Estimated: >60 mL/min (ref >60)
Glucose, Bld: 112 mg/dL — ABNORMAL HIGH (ref 70–99)
Potassium: 4.3 mmol/L (ref 3.5–5.1)
Sodium: 139 mmol/L (ref 135–145)

## 2021-10-03 LAB — VITAMIN D 25 HYDROXY (VIT D DEFICIENCY, FRACTURES): Vit D, 25-Hydroxy: 72.34 ng/mL (ref 30–100)

## 2021-10-05 ENCOUNTER — Telehealth: Payer: Self-pay | Admitting: *Deleted

## 2021-10-05 NOTE — Telephone Encounter (Signed)
-----   Message from Satira Sark, MD sent at 10/03/2021  1:55 PM EST ----- Results reviewed.  Potassium and renal function remain stable after increasing Lasix dose.  Continue with current follow-up plan.

## 2021-10-05 NOTE — Telephone Encounter (Signed)
Patient's daughter Ramond Marrow informed. Copy sent to PCP

## 2021-10-05 NOTE — Telephone Encounter (Signed)
Patient's daughter returning call. She requests the call back go to her number: [phone]

## 2021-10-06 DIAGNOSIS — I10 Essential (primary) hypertension: Secondary | ICD-10-CM | POA: Diagnosis not present

## 2021-10-06 DIAGNOSIS — E875 Hyperkalemia: Secondary | ICD-10-CM | POA: Diagnosis not present

## 2021-10-06 DIAGNOSIS — I5033 Acute on chronic diastolic (congestive) heart failure: Secondary | ICD-10-CM | POA: Diagnosis not present

## 2021-10-06 DIAGNOSIS — Z79899 Other long term (current) drug therapy: Secondary | ICD-10-CM | POA: Diagnosis not present

## 2021-10-09 NOTE — Progress Notes (Signed)
[place], [place] [address]   CLINIC:  Medical Oncology/Hematology  PCP:  Lori Noble, MD [address] / [place] [address] [phone]   REASON FOR VISIT:  Follow-up for left-sided breast cancer and JAK2 + polycythemia vera/essential thrombocytosis  PRIOR THERAPY: Left-sided lumpectomy with adjuvant XRT  CURRENT THERAPY: - Letrozole daily since January 2019 - Hydrea (current dose 1000 mg M/W/F and 500 mg T/T/S/S)  BRIEF ONCOLOGIC HISTORY:  Oncology History  Malignant neoplasm of upper-outer quadrant of left breast in female, estrogen receptor positive ([place])  07/10/2017 Surgery   Left lumpectomy: IDC grade 2, 1.3 cm, intermediate grade DCIS, margins negative, 1/2 lymph nodes positive, ER 5% positive weak staining, PR 0%, HER-2 negative ratio 1.73, Ki-67 15%, T1cN0 stage IA AJCC 8   07/10/2017 Miscellaneous   Mammaprint: Low risk, 10-year risk of recurrence untreated 10%   10/01/2017 - 11/14/2017 Radiation Therapy   Adj XRT   11/2017 -  Anti-estrogen oral therapy   Letrozole daily     CANCER STAGING: Cancer Staging  Malignant neoplasm of upper-outer quadrant of left breast in female, estrogen receptor positive ([place]) Staging form: Breast, AJCC 8th Edition - Clinical stage from 05/22/2017: Stage IA (cT1, cN0, cM0, G1, ER+, PR-, HER2-) - Signed by Holley Bouche, NP on 06/22/2017 - Pathologic: Stage IIA (pT1c, pN1a, cM0, G2, ER+, PR-, HER2-) - Unsigned   INTERVAL HISTORY:  [name], a 80 y.o. female, returns for routine follow-up of her left-sided breast cancer and JAK2 + polycythemia vera/essential thrombocytosis. [name] was last seen on 05/30/2021 by Dede Query, PA-C.  At today's visit, she reports feeling fairly well.  She denies any recent hospitalizations, surgeries, or changes in her baseline health status.  Most recent mammogram on 05/24/2021 showed no evidence of malignancy in either breast.  She  denies any symptoms of recurrence such as new lumps, bone pain, chest pain, or abdominal pain.  She reports some chronic dyspnea on exertion, but denies any new shortness of breath or changes in her breathing patterns.  She has no new headaches, seizures, or focal neurologic deficits.  No B symptoms such as fever, chills, night sweats, unintentional weight loss.  She continues to take letrozole as prescribed, and is tolerating it well.  She does have leg cramps from time to time.  She denies any hot flashes, mood swings, or abnormal weight gain.  Regarding her thrombocytosis, she has no history of DVT, PE, MI, or CVA.  She denies any current signs or symptoms of blood clots.   She denies any aquagenic pruritus, Raynaud's, erythromelalgia (itching/burning pain).  No vasomotor symptoms such as dizziness, tinnitus, intermittent blurry vision, strokelike symptoms, or neuropathy. No B symptoms such as fever, chills, night sweats, unintentional weight loss. She denies any LUQ abdominal pain, nausea, or early satiety.  She is tolerating Hydrea well.  CBC shows expected macrocytosis.  Patient denies cutaneous ulcers and nonhealing skin wounds.  No complaints of mouth sores.  Denies gastrointestinal symptoms such as gastritis, nausea, vomiting, diarrhea.  Energy levels have been fair and at baseline.  She reports 40% energy and 75% appetite.  She is maintaining stable weight at this time.    REVIEW OF SYSTEMS:  Review of Systems  Constitutional:  Positive for fatigue (Chronic fatigue at baseline, energy 40%). Negative for appetite change, chills, diaphoresis, fever and unexpected weight change.  HENT:   Negative for lump/mass and nosebleeds.   Eyes:  Positive for eye  problems (Chronic blurry vision from macular degeneration).  Respiratory:  Positive for shortness of breath (With exertion). Negative for cough and hemoptysis.   Cardiovascular:  Negative for chest pain, leg swelling and palpitations.   Gastrointestinal:  Negative for abdominal pain, blood in stool, constipation, diarrhea, nausea and vomiting.  Genitourinary:  Positive for frequency. Negative for hematuria.   Skin: Negative.   Neurological:  Positive for headaches (Occasional). Negative for dizziness and light-headedness.  Hematological:  Does not bruise/bleed easily.  Psychiatric/Behavioral:  Positive for sleep disturbance.    PAST MEDICAL/SURGICAL HISTORY:  Past Medical History:  Diagnosis Date   Arthritis    Chronic diastolic heart failure ([place])    Essential hypertension    GERD (gastroesophageal reflux disease)    Gout    Hiatal hernia    History of endometrial cancer    Sp hysterectomy 2012   Ischemic colitis ([place]) 2016   Myeloproliferative disorder, JAK-2 positive    Obesity    Pneumonia    Polycythemia vera(238.4)    TIA (transient ischemic attack)    Past Surgical History:  Procedure Laterality Date   ABDOMINAL HYSTERECTOMY  09/12/2011   Procedure: HYSTERECTOMY ABDOMINAL;  Surgeon: Janie Morning, MD;  Location: [facility];  Service: Gynecology;  Laterality: N/A;  Total Abdominal Hysterectomy, Bilateral Salpingo Oophorectomy   BREAST LUMPECTOMY Left 07/10/2017   LEFT BREAST LUMPECTOMY WITH RADIOACTIVE SEED AND SENTINEL LYMPH NODE BIOPSY    BREAST LUMPECTOMY WITH RADIOACTIVE SEED AND SENTINEL LYMPH NODE BIOPSY Left 07/10/2017   Procedure: LEFT BREAST LUMPECTOMY WITH RADIOACTIVE SEED AND SENTINEL LYMPH NODE BIOPSY;  Surgeon: Rolm Bookbinder, MD;  Location: [facility];  Service: General;  Laterality: Left;   COLONOSCOPY  07/05/2012   Procedure: COLONOSCOPY;  Surgeon: Rogene Houston, MD;  Location: [facility];  Service: Endoscopy;  Laterality: N/A;  730   EYE SURGERY     bilateral 8 - 12 yrs ago   FLEXIBLE SIGMOIDOSCOPY N/A 04/03/2015   Procedure: FLEXIBLE SIGMOIDOSCOPY;  Surgeon: Rogene Houston, MD;  [facility];  Service: Endoscopy;  Laterality: N/A;   HYSTEROSCOPY WITH D & C  08/15/2011    Procedure: DILATATION AND CURETTAGE (D&C) /HYSTEROSCOPY;  Surgeon: Jonnie Kind, MD;  Location: [facility];  Service: Gynecology;  Laterality: N/A;  With Suction Curette   JOINT REPLACEMENT     left knee replaced 2014   KNEE ARTHROPLASTY Left 03/15/2015   Procedure: COMPUTER ASSISTED TOTAL KNEE ARTHROPLASTY;  Surgeon: Marybelle Killings, MD;  Location: [facility];  Service: Orthopedics;  Laterality: Left;   SALPINGOOPHORECTOMY  09/12/2011   Procedure: SALPINGO OOPHERECTOMY;  Surgeon: Janie Morning, MD;  Location: [facility];  Service: Gynecology;  Laterality: Bilateral;    SOCIAL HISTORY:  Social History   Socioeconomic History   Marital status: Widowed    Spouse name: Not on file   Number of children: Not on file   Years of education: Not on file   Highest education level: Not on file  Occupational History   Not on file  Tobacco Use   Smoking status: Never   Smokeless tobacco: Never  Vaping Use   Vaping Use: Never used  Substance and Sexual Activity   Alcohol use: No   Drug use: No   Sexual activity: Never    Birth control/protection: Surgical    Comment: hysterectomy  Other Topics Concern   Not on file  Social History Narrative   Not on file   Social Determinants of Health   Financial Resource Strain:  Low Risk    Difficulty of Paying Living Expenses: Not hard at all  Food Insecurity: No Food Insecurity   Worried About Running Out of Food in the Last Year: Never true   Ran Out of Food in the Last Year: Never true  Transportation Needs: No Transportation Needs   Lack of Transportation (Medical): No   Lack of Transportation (Non-Medical): No  Physical Activity: Insufficiently Active   Days of Exercise per Week: 3 days   Minutes of Exercise per Session: 30 min  Stress: No Stress Concern Present   Feeling of Stress : Only a little  Social Connections: Moderately Integrated   Frequency of Communication with Friends and Family: More than three times a week   Frequency of Social  Gatherings with Friends and Family: More than three times a week   Attends Religious Services: More than 4 times per year   Active Member of [employer] or Organizations: No   Attends Music therapist: Never   Marital Status: Married  Human resources officer Violence: Not At Risk   Fear of Current or Ex-Partner: No   Emotionally Abused: No   Physically Abused: No   Sexually Abused: No    FAMILY HISTORY:  Family History  Problem Relation Age of Onset   Hypertension Mother    Lung cancer Father    Arthritis/Rheumatoid Sister    Anesthesia problems Neg Hx    Hypotension Neg Hx    Malignant hyperthermia Neg Hx    Pseudochol deficiency Neg Hx     CURRENT MEDICATIONS:  Current Outpatient Medications  Medication Sig Dispense Refill   aspirin EC 81 MG tablet Take 81 mg by mouth at bedtime.      carvedilol (COREG) 12.5 MG tablet Take 12.5 mg by mouth 2 (two) times daily with a meal. Take 1/2 tablet by 2 times daily     cloNIDine (CATAPRES) 0.3 MG tablet Take 0.15 mg by mouth 2 (two) times daily.     diltiazem (CARDIZEM CD) 180 MG 24 hr capsule Take 180 mg by mouth daily.      doxylamine, Sleep, (SLEEP AID) 25 MG tablet Take 25 mg by mouth at bedtime as needed.     esomeprazole (NEXIUM) 40 MG capsule Take 1 capsule (40 mg total) by mouth every morning. 30 capsule 11   furosemide (LASIX) 40 MG tablet Take 1.5 tablets (60 mg total) by mouth daily. 45 tablet 3   hydroxyurea (HYDREA) 500 MG capsule Take 2 tablets on Monday, Wednesday and Friday and 1 tablet daily rest of the week.  May take with food to minimize GI side effects. 90 capsule 3   isosorbide mononitrate (IMDUR) 30 MG 24 hr tablet Take 30 mg by mouth daily.     letrozole (FEMARA) 2.5 MG tablet TAKE 1 TABLET BY MOUTH ONCE DAILY . APPOINTMENT REQUIRED FOR FUTURE REFILLS 30 tablet 6   levofloxacin (LEVAQUIN) 750 MG tablet Take 1 tablet (750 mg total) by mouth daily. 7 tablet 0   losartan (COZAAR) 100 MG tablet Take 100 mg by mouth  every evening.      Multiple Vitamins-Minerals (PRESERVISION AREDS PO) Take by mouth.     Simethicone (GAS RELIEF PO) Take 1 tablet by mouth daily.     simvastatin (ZOCOR) 10 MG tablet Take 10 mg by mouth every evening.      Vitamin D, Ergocalciferol, (DRISDOL) 1.25 MG (50000 UNIT) CAPS capsule Take 1 capsule by mouth once a week 12 capsule 0   No current  facility-administered medications for this visit.    ALLERGIES:  Allergies  Allergen Reactions   Carbamazepine Hives and Other (See Comments)    headache   Sulfa Antibiotics     Rash    PHYSICAL EXAM:  Performance status (ECOG): 2 - Symptomatic, <50% confined to bed  There were no vitals filed for this visit. Wt Readings from Last 3 Encounters:  09/15/21 248 lb 12.8 oz (112.9 kg)  08/11/21 249 lb (112.9 kg)  05/30/21 243 lb 8 oz (110.5 kg)   Physical Exam Constitutional:      Appearance: Normal appearance. She is obese.  HENT:     Head: Normocephalic and atraumatic.     Mouth/Throat:     Mouth: Mucous membranes are moist.  Eyes:     Extraocular Movements: Extraocular movements intact.     Pupils: Pupils are equal, round, and reactive to light.  Cardiovascular:     Rate and Rhythm: Normal rate and regular rhythm.     Pulses: Normal pulses.     Heart sounds: Normal heart sounds.  Pulmonary:     Effort: Pulmonary effort is normal.     Breath sounds: Normal breath sounds.  Chest:     Comments: No discrete nodules or masses in either breast. Abdominal:     General: Bowel sounds are normal.     Palpations: Abdomen is soft.     Tenderness: There is no abdominal tenderness.  Musculoskeletal:        General: No swelling.     Right lower leg: No edema.     Left lower leg: No edema.  Lymphadenopathy:     Cervical: No cervical adenopathy.  Skin:    General: Skin is warm and dry.  Neurological:     General: No focal deficit present.     Mental Status: She is alert and oriented to person, place, and time.  Psychiatric:         Mood and Affect: Mood normal.        Behavior: Behavior normal.     LABORATORY DATA:  I have reviewed the labs as listed.  CBC Latest Ref Rng & Units 10/03/2021 05/24/2021 04/25/2021  WBC 4.0 - 10.5 K/uL 7.7 7.2 8.9  Hemoglobin 12.0 - 15.0 g/dL 15.1(H) 14.2 13.6  Hematocrit 36.0 - 46.0 % 46.7(H) 44.6 41.0  Platelets 150 - 400 K/uL 447(H) 407(H) 510(H)   CMP Latest Ref Rng & Units 10/03/2021 10/03/2021 05/24/2021  Glucose 70 - 99 mg/dL 112(H) 112(H) 88  BUN 8 - 23 mg/dL 21 20 24(H)  Creatinine 0.44 - 1.00 mg/dL 0.65 0.61 0.60  Sodium 135 - 145 mmol/L 139 138 136  Potassium 3.5 - 5.1 mmol/L 4.3 4.3 4.0  Chloride 98 - 111 mmol/L 103 103 102  CO2 22 - 32 mmol/L 31 30 25   Calcium 8.9 - 10.3 mg/dL 9.2 9.2 8.9  Total Protein 6.5 - 8.1 g/dL - 7.0 6.9  Total Bilirubin 0.3 - 1.2 mg/dL - 0.6 0.9  Alkaline Phos 38 - 126 U/L - 89 75  AST 15 - 41 U/L - 18 16  ALT 0 - 44 U/L - 11 12    DIAGNOSTIC IMAGING:  I have independently reviewed the scans and discussed with the patient. ECHOCARDIOGRAM COMPLETE  Result Date: 09/13/2021    ECHOCARDIOGRAM REPORT   Patient Name:   [name] Date of Exam: 09/13/2021 Medical Rec #:  [id number]        Height:       64.0  in Accession #:    [id number]       Weight:       249.0 lb Date of Birth:  02-24-1941        BSA:          2.147 m Patient Age:    29 years         BP:           178/96 mmHg Patient Gender: F                HR:           84 bpm. Exam Location:  [facility] Procedure: 2D Echo, Cardiac Doppler and Color Doppler Indications:    Murmur  History:        Patient has prior history of Echocardiogram examinations, most                 recent 11/10/2017. COPD; Risk Factors:Hypertension. Breast CA,                 PHTN, Dysrhythmia.  Sonographer:    Wenda Low Referring Phys: [place] Comments: Patient is morbidly obese. IMPRESSIONS  1. Left ventricular ejection fraction, by estimation, is 65 to 70%. The left ventricle  has normal function. The left ventricle has no regional wall motion abnormalities. There is moderate left ventricular hypertrophy. Left ventricular diastolic parameters are indeterminate.  2. Right ventricular systolic function is normal. The right ventricular size is normal. There is severely elevated pulmonary artery systolic pressure.  3. Left atrial size was severely dilated.  4. The mitral valve is abnormal. No evidence of mitral valve regurgitation. Mild to moderate mitral stenosis. Moderate mitral annular calcification.  5. The tricuspid valve is abnormal.  6. The aortic valve is tricuspid. There is moderate calcification of the aortic valve. There is moderate thickening of the aortic valve. Aortic valve regurgitation is not visualized. Mild aortic valve stenosis. Aortic valve mean gradient measures 10.0 mmHg. Aortic valve peak gradient measures 22.7 mmHg. Aortic valve area, by VTI measures 2.11 cm  7. The inferior vena cava is dilated in size with >50% respiratory variability, suggesting right atrial pressure of 8 mmHg. FINDINGS  Left Ventricle: Left ventricular ejection fraction, by estimation, is 65 to 70%. The left ventricle has normal function. The left ventricle has no regional wall motion abnormalities. The left ventricular internal cavity size was normal in size. There is  moderate left ventricular hypertrophy. Left ventricular diastolic parameters are indeterminate. Indeterminate filling pressures. Right Ventricle: The right ventricular size is normal. Right vetricular wall thickness was not well visualized. Right ventricular systolic function is normal. There is severely elevated pulmonary artery systolic pressure. The tricuspid regurgitant velocity is 3.60 m/s, and with an assumed right atrial pressure of 15 mmHg, the estimated right ventricular systolic pressure is 27.7 mmHg. Left Atrium: Left atrial size was severely dilated. Right Atrium: Right atrial size was normal in size. Pericardium: There  is no evidence of pericardial effusion. Mitral Valve: The mitral valve is abnormal. There is moderate thickening of the mitral valve leaflet(s). There is moderate calcification of the mitral valve leaflet(s). Moderate mitral annular calcification. No evidence of mitral valve regurgitation. Mild to moderate mitral valve stenosis. MV peak gradient, 10.1 mmHg. The mean mitral valve gradient is 5.0 mmHg. Tricuspid Valve: The tricuspid valve is abnormal. Tricuspid valve regurgitation is mild . No evidence of tricuspid stenosis. Aortic Valve: The aortic valve is tricuspid. There is moderate calcification of the aortic  valve. There is moderate thickening of the aortic valve. There is moderate aortic valve annular calcification. Aortic valve regurgitation is not visualized. Mild aortic stenosis is present. Aortic valve mean gradient measures 10.0 mmHg. Aortic valve peak gradient measures 22.7 mmHg. Aortic valve area, by VTI measures 2.11 cm. Pulmonic Valve: The pulmonic valve was not well visualized. Pulmonic valve regurgitation is mild. No evidence of pulmonic stenosis. Aorta: The aortic root is normal in size and structure. Venous: The inferior vena cava is dilated in size with greater than 50% respiratory variability, suggesting right atrial pressure of 8 mmHg. IAS/Shunts: No atrial level shunt detected by color flow Doppler.  LEFT VENTRICLE PLAX 2D LVIDd:         5.30 cm     Diastology LVIDs:         2.40 cm     LV e' medial:    8.05 cm/s LV PW:         1.20 cm     LV E/e' medial:  19.0 LV IVS:        1.30 cm     LV e' lateral:   10.40 cm/s LVOT diam:     2.00 cm     LV E/e' lateral: 14.7 LV SV:         110 LV SV Index:   51 LVOT Area:     3.14 cm  LV Volumes (MOD) LV vol d, MOD A2C: 91.5 ml LV vol d, MOD A4C: 66.8 ml LV vol s, MOD A2C: 48.7 ml LV vol s, MOD A4C: 24.3 ml LV SV MOD A2C:     42.8 ml LV SV MOD A4C:     66.8 ml LV SV MOD BP:      43.3 ml RIGHT VENTRICLE RV Basal diam:  3.55 cm RV Mid diam:    2.90 cm RV S  prime:     11.30 cm/s TAPSE (M-mode): 3.2 cm LEFT ATRIUM              Index        RIGHT ATRIUM           Index LA diam:        5.00 cm  2.33 cm/m   RA Area:     20.00 cm LA Vol (A2C):   94.9 ml  44.19 ml/m  RA Volume:   60.00 ml  27.94 ml/m LA Vol (A4C):   129.0 ml 60.07 ml/m LA Biplane Vol: 119.0 ml 55.42 ml/m  AORTIC VALVE                     PULMONIC VALVE AV Area (Vmax):    1.83 cm      PV Vmax:       1.41 m/s AV Area (Vmean):   2.02 cm      PV Peak grad:  8.0 mmHg AV Area (VTI):     2.11 cm AV Vmax:           238.00 cm/s AV Vmean:          146.667 cm/s AV VTI:            0.521 m AV Peak Grad:      22.7 mmHg AV Mean Grad:      10.0 mmHg LVOT Vmax:         139.00 cm/s LVOT Vmean:        94.400 cm/s LVOT VTI:          0.350 m  LVOT/AV VTI ratio: 0.67  AORTA Ao Root diam: 3.50 cm MITRAL VALVE                TRICUSPID VALVE MV Area (PHT): 3.11 cm     TR Peak grad:   51.8 mmHg MV Area VTI:   2.51 cm     TR Vmax:        360.00 cm/s MV Peak grad:  10.1 mmHg MV Mean grad:  5.0 mmHg     SHUNTS MV Vmax:       1.59 m/s     Systemic VTI:  0.35 m MV Vmean:      109.0 cm/s   Systemic Diam: 2.00 cm MV Decel Time: 244 msec MV E velocity: 153.00 cm/s MV A velocity: 168.00 cm/s MV E/A ratio:  0.91 Carlyle Dolly MD Electronically signed by Carlyle Dolly MD Signature Date/Time: 09/13/2021/4:06:30 PM    Final    Intravitreal Injection, Pharmacologic Agent - OS - Left Eye  Result Date: 09/27/2021 Time Out 09/27/2021. 2:15 PM. Confirmed correct patient, procedure, site, and patient consented. Anesthesia Topical anesthesia was used. Anesthetic medications included Lidocaine 4%. Procedure Preparation included Tobramycin 0.3%, 10% betadine to eyelids, 5% betadine to ocular surface. A 30 gauge needle was used. Injection: 2.5 mg bevacizumab 2.5 MG/0.1ML   Route: Intravitreal, Site: Left Eye   NDC: [phone], Lot: [id number] Post-op Post injection exam found visual acuity of at least counting fingers. The patient  tolerated the procedure well. There were no complications. The patient received written and verbal post procedure care education. Post injection medications included ocuflox.   OCT, Retina - OU - Both Eyes  Result Date: 09/27/2021 Right Eye Quality was good. Scan locations included subfoveal. Central Foveal Thickness: 256. Progression has been stable. Findings include no SRF, no IRF. Left Eye Quality was good. Scan locations included subfoveal. Central Foveal Thickness: 261. Progression has improved. Findings include no IRF, subretinal fluid, abnormal foveal contour. Notes OS today at 10 weeks post injection we planned for repeat injection today, with still with subretinal fluid active but much less today at this longer interval.  Repeat injection today and decrease interval examination OS next 8 weeks OD today now 8 months post last injection stable no sign of CNVM    ASSESSMENT & PLAN: 1.  T1CN1A (stage IIa) malignant neoplasm of upper-outer quadrant of left breast - Left breast biopsy on 05/29/2017 of the upper outer quadrant mass - IDC, grade 1/2.  ER 5%, PR 0%, Ki-67 15%, HER-2 negative. - Left breast lumpectomy on 07/10/2017, IDC, grade 2, 1.3 cm, margins negative, 1/2 lymph nodes positive, pT1c, PN 1A - She was evaluated by Dr. Lindi Adie and was last seen in 2020. - MammaPrint-low risk luminal A, average 10-year risk of recurrence untreated 10%. - Adjuvant XRT from 10/01/2017-11/15/2017. - Letrozole 1 mg daily for 5 to 7 years started on 11/15/2017. - She is tolerating letrozole without any major hot flashes.   - She reports some cramping in her legs on and off.   - Reviewed mammogram from 05/24/2021, BI-RADS Category 2 (stable lumpectomy changes in left breast, no mammographic evidence of malignancy in either breast). - Physical exam today did not reveal any palpable masses or adenopathy.  - ROS did not reveal any red flag symptoms of recurrence   - Most recent labs (10/03/2021):CMP unremarkable,  LDH normal, vitamin D normal.  CBC within patient's baseline range for her essential thrombocytosis/polycythemia. - PLAN: Next mammogram due July 2023.  Repeat breast exam and  follow-up in 6 months.  2.  JAK2 + essential thrombocytosis: - JAK2 V617F mutation detected in 2015 - No prior history of thrombosis - Hydroxyurea was started in November 2019 - Current dose of hydroxyurea 2 tablets (1000 mg) M/W/F, 1 tablet (500 mg) T/T/S/S - She is tolerating her current dose of Hydrea well  - No vasomotor symptoms, erythromelalgia, or aquagenic pruritus  - Most recent CBC (10/03/2021): WBC 7.7, Hgb 15.1/HCT 46.7/MCV 104.0, platelets 447 - PLAN:  Recommend continuing same dose of Hydrea.  Repeat labs and RTC in 3 months.  - Patient has upcoming tooth extraction/dental surgery, has been instructed to stop her Hydrea 2 days before the procedure and resume it 1 to 2 weeks after procedure. - Patient is aware of alarm symptoms that would prompt more immediate medical attention.  3.  Bone health - Patient has been on letrozole since January 2019, with a goal of 5 to 7 years of therapy - Bone density scan (05/24/2021): T score -1.0, which is considered normal. - PLAN: Continue weightbearing exercises, calcium, and vitamin D.  We will repeat bone density scan in July 2024 due to aromatase inhibitor use.  4.  Vitamin D deficiency: - Patient is taking vitamin D 50,000 units weekly - Most recent vitamin D level (10/03/2021): Normal at 72.34 - PLAN: Continue vitamin D supplement 50,000 units weekly.  Repeat Vitamin D labs in 6 months.  5.  Social/family history: -She was recently widowed in February 2022.  She is living independently.  She works part-time for 6 hours a day, office job.  She is non-smoker. -Father had lung cancer.    PLAN SUMMARY & DISPOSITION: Labs and RTC in 3 months  All questions were answered. The patient knows to call the clinic with any problems, questions or concerns.  Medical  decision making: Moderate  Time spent on visit: I spent 20 minutes counseling the patient face to face. The total time spent in the appointment was 30 minutes and more than 50% was on counseling.   Harriett Rush, PA-C  10/10/2021 5:01 PM

## 2021-10-10 ENCOUNTER — Inpatient Hospital Stay (HOSPITAL_COMMUNITY): Payer: Medicare Other | Attending: Hematology | Admitting: Physician Assistant

## 2021-10-10 ENCOUNTER — Ambulatory Visit (HOSPITAL_COMMUNITY): Payer: Medicare Other | Admitting: Hematology

## 2021-10-10 ENCOUNTER — Other Ambulatory Visit: Payer: Self-pay

## 2021-10-10 VITALS — BP 172/71 | HR 64 | Temp 97.5°F | Resp 20 | Ht 62.99 in | Wt 247.6 lb

## 2021-10-10 DIAGNOSIS — Z8261 Family history of arthritis: Secondary | ICD-10-CM | POA: Diagnosis not present

## 2021-10-10 DIAGNOSIS — Z8249 Family history of ischemic heart disease and other diseases of the circulatory system: Secondary | ICD-10-CM | POA: Diagnosis not present

## 2021-10-10 DIAGNOSIS — R5383 Other fatigue: Secondary | ICD-10-CM | POA: Diagnosis not present

## 2021-10-10 DIAGNOSIS — R0602 Shortness of breath: Secondary | ICD-10-CM | POA: Diagnosis not present

## 2021-10-10 DIAGNOSIS — I259 Chronic ischemic heart disease, unspecified: Secondary | ICD-10-CM | POA: Diagnosis not present

## 2021-10-10 DIAGNOSIS — Z8542 Personal history of malignant neoplasm of other parts of uterus: Secondary | ICD-10-CM | POA: Diagnosis not present

## 2021-10-10 DIAGNOSIS — Z79811 Long term (current) use of aromatase inhibitors: Secondary | ICD-10-CM | POA: Insufficient documentation

## 2021-10-10 DIAGNOSIS — Z90710 Acquired absence of both cervix and uterus: Secondary | ICD-10-CM | POA: Diagnosis not present

## 2021-10-10 DIAGNOSIS — Z170 Estrogen receptor positive status [ER+]: Secondary | ICD-10-CM | POA: Insufficient documentation

## 2021-10-10 DIAGNOSIS — D473 Essential (hemorrhagic) thrombocythemia: Secondary | ICD-10-CM | POA: Insufficient documentation

## 2021-10-10 DIAGNOSIS — E559 Vitamin D deficiency, unspecified: Secondary | ICD-10-CM | POA: Diagnosis not present

## 2021-10-10 DIAGNOSIS — R252 Cramp and spasm: Secondary | ICD-10-CM | POA: Diagnosis not present

## 2021-10-10 DIAGNOSIS — Z882 Allergy status to sulfonamides status: Secondary | ICD-10-CM | POA: Insufficient documentation

## 2021-10-10 DIAGNOSIS — R0609 Other forms of dyspnea: Secondary | ICD-10-CM | POA: Diagnosis not present

## 2021-10-10 DIAGNOSIS — D75839 Thrombocytosis, unspecified: Secondary | ICD-10-CM | POA: Diagnosis not present

## 2021-10-10 DIAGNOSIS — D45 Polycythemia vera: Secondary | ICD-10-CM | POA: Diagnosis not present

## 2021-10-10 DIAGNOSIS — Z8673 Personal history of transient ischemic attack (TIA), and cerebral infarction without residual deficits: Secondary | ICD-10-CM | POA: Insufficient documentation

## 2021-10-10 DIAGNOSIS — Z801 Family history of malignant neoplasm of trachea, bronchus and lung: Secondary | ICD-10-CM | POA: Insufficient documentation

## 2021-10-10 DIAGNOSIS — C50412 Malignant neoplasm of upper-outer quadrant of left female breast: Secondary | ICD-10-CM | POA: Diagnosis not present

## 2021-10-10 DIAGNOSIS — R519 Headache, unspecified: Secondary | ICD-10-CM | POA: Insufficient documentation

## 2021-10-10 DIAGNOSIS — Z90721 Acquired absence of ovaries, unilateral: Secondary | ICD-10-CM | POA: Diagnosis not present

## 2021-10-10 DIAGNOSIS — Z79899 Other long term (current) drug therapy: Secondary | ICD-10-CM | POA: Insufficient documentation

## 2021-10-10 NOTE — Patient Instructions (Addendum)
[place] at [facility] Discharge Instructions  You were seen today by Tarri Abernethy PA-C for the following conditions.  HISTORY OF LEFT BREAST CANCER: The most recent mammogram did not show any signs of returning cancer.  There were no major abnormalities on your exam today.  You will be due for another mammogram in July 2023.  We will repeat a breast exam at follow-up visit in about 6 months.  ESSENTIAL THROMBOCYTOSIS: This refers to her elevated platelets, which is due to an underlying genetic mutation (JAK2).  This places you at an increased risk for blood clots (including heart attack and stroke) due to high platelet levels.  Therefore, we recommend that you continue to take hydroxyurea (Hydrea) at the same dose.  Prior to your upcoming tooth extraction you can STOP Hydrea 2 days prior to your procedure, and can RESUME Hydrea 1 to 2 weeks after your procedure, to allow for better wound healing.  We will check your labs and see you back again in 3 months to follow-up on your blood work.  LABS: Return in 3 months for repeat labs  MEDICATIONS: - Continue hydroxyurea (Hydrea) 2 tablets (1000 mg) Monday/Wednesday/Friday and 1 tablet (500 mg) Tuesday/Thursday/Saturday/Sunday. - Continue vitamin D once per week.  FOLLOW-UP APPOINTMENT: Office visit in 3 months, after labs   Thank you for choosing [place] at [facility] to provide your oncology and hematology care.  To afford each patient quality time with our provider, please arrive at least 15 minutes before your scheduled appointment time.   If you have a lab appointment with the [facility] please come in thru the Main Entrance and check in at the main information desk.  You need to re-schedule your appointment should you arrive 10 or more minutes late.  We strive to give you quality time with our providers, and arriving late affects you and other patients whose appointments are after  yours.  Also, if you no show three or more times for appointments you may be dismissed from the clinic at the providers discretion.     Again, thank you for choosing [facility].  Our hope is that these requests will decrease the amount of time that you wait before being seen by our physicians.       _____________________________________________________________  Should you have questions after your visit to [facility], please contact our office at [phone] and follow the prompts.  Our office hours are 8:00 a.m. and 4:30 p.m. Monday - Friday.  Please note that voicemails left after 4:00 p.m. may not be returned until the following business day.  We are closed weekends and major holidays.  You do have access to a nurse 24-7, just call the main number to the clinic [phone] and do not press any options, hold on the line and a nurse will answer the phone.    For prescription refill requests, have your pharmacy contact our office and allow 72 hours.    Due to Covid, you will need to wear a mask upon entering the hospital. If you do not have a mask, a mask will be given to you at the Main Entrance upon arrival. For doctor visits, patients may have 1 support person age 24 or older with them. For treatment visits, patients can not have anyone with them due to social distancing guidelines and our immunocompromised population.

## 2021-10-13 DIAGNOSIS — I5020 Unspecified systolic (congestive) heart failure: Secondary | ICD-10-CM | POA: Diagnosis not present

## 2021-10-13 DIAGNOSIS — I10 Essential (primary) hypertension: Secondary | ICD-10-CM | POA: Diagnosis not present

## 2021-10-13 DIAGNOSIS — Z23 Encounter for immunization: Secondary | ICD-10-CM | POA: Diagnosis not present

## 2021-10-13 DIAGNOSIS — E785 Hyperlipidemia, unspecified: Secondary | ICD-10-CM | POA: Diagnosis not present

## 2021-11-02 DIAGNOSIS — J449 Chronic obstructive pulmonary disease, unspecified: Secondary | ICD-10-CM | POA: Diagnosis not present

## 2021-11-02 DIAGNOSIS — Z96652 Presence of left artificial knee joint: Secondary | ICD-10-CM | POA: Diagnosis not present

## 2021-11-02 DIAGNOSIS — J9601 Acute respiratory failure with hypoxia: Secondary | ICD-10-CM | POA: Diagnosis not present

## 2021-11-15 ENCOUNTER — Encounter (INDEPENDENT_AMBULATORY_CARE_PROVIDER_SITE_OTHER): Payer: Self-pay | Admitting: Ophthalmology

## 2021-11-15 ENCOUNTER — Ambulatory Visit (INDEPENDENT_AMBULATORY_CARE_PROVIDER_SITE_OTHER): Payer: Medicare Other | Admitting: Ophthalmology

## 2021-11-15 ENCOUNTER — Other Ambulatory Visit: Payer: Self-pay

## 2021-11-15 DIAGNOSIS — H353212 Exudative age-related macular degeneration, right eye, with inactive choroidal neovascularization: Secondary | ICD-10-CM | POA: Diagnosis not present

## 2021-11-15 DIAGNOSIS — H353221 Exudative age-related macular degeneration, left eye, with active choroidal neovascularization: Secondary | ICD-10-CM | POA: Diagnosis not present

## 2021-11-15 MED ORDER — BEVACIZUMAB 2.5 MG/0.1ML IZ SOSY
2.5000 mg | PREFILLED_SYRINGE | INTRAVITREAL | Status: AC | PRN
  Administered 2021-11-15: 2.5 mg via INTRAVITREAL

## 2021-11-15 NOTE — Assessment & Plan Note (Addendum)
°  Recent interval examination 10 weeks resulted in subretinal fluid see findings on 09-27-2021  Vastly improved macular findings today at shorter interval of 8 weeks.  No residual subretinal fluid today. We will maintain 8-week follow-up interval after intravitreal injection today

## 2021-11-15 NOTE — Progress Notes (Signed)
11/15/2021     CHIEF COMPLAINT Patient presents for  Chief Complaint  Patient presents with   Retina Follow Up    9 week fu Avastin OS Pt states VA OU stable since last visit. Pt denies FOL, floaters, or ocular pain OU.         HISTORY OF PRESENT ILLNESS: [name] is a 81 y.o. female who presents to the clinic today for:   HPI     Retina Follow Up           Diagnosis: Wet AMD   Laterality: left eye   Onset: 7 weeks ago   Severity: mild   Duration: 7 weeks   Course: stable   Comments: 9 week fu Avastin OS Pt states VA OU stable since last visit. Pt denies FOL, floaters, or ocular pain OU.            Comments   7 week dilate OS avastin OCT OS. Patient states vision is stable and unchanged since last visit. Denies any new floaters or FOL.       Last edited by Laurin Coder on 11/15/2021  2:01 PM.      Referring physician: [name], MD [address] [place],  [place] [address]  HISTORICAL INFORMATION:   Selected notes from the [facility]: No current outpatient medications on file. (Ophthalmic Drugs)   No current facility-administered medications for this visit. (Ophthalmic Drugs)   Current Outpatient Medications (Other)  Medication Sig   aspirin EC 81 MG tablet Take 81 mg by mouth at bedtime.    carvedilol (COREG) 12.5 MG tablet Take 12.5 mg by mouth 2 (two) times daily with a meal. Take 1/2 tablet by 2 times daily   cloNIDine (CATAPRES) 0.3 MG tablet Take 0.15 mg by mouth 2 (two) times daily.   diltiazem (CARDIZEM CD) 180 MG 24 hr capsule Take 180 mg by mouth daily.    doxylamine, Sleep, (SLEEP AID) 25 MG tablet Take 25 mg by mouth at bedtime as needed. (Patient not taking: Reported on 10/10/2021)   esomeprazole (NEXIUM) 40 MG capsule Take 1 capsule (40 mg total) by mouth every morning.   furosemide (LASIX) 40 MG tablet Take 1.5 tablets (60 mg total) by mouth daily.   hydroxyurea (HYDREA) 500  MG capsule Take 2 tablets on Monday, Wednesday and Friday and 1 tablet daily rest of the week.  May take with food to minimize GI side effects.   isosorbide mononitrate (IMDUR) 30 MG 24 hr tablet Take 30 mg by mouth daily.   letrozole (FEMARA) 2.5 MG tablet TAKE 1 TABLET BY MOUTH ONCE DAILY . APPOINTMENT REQUIRED FOR FUTURE REFILLS   levofloxacin (LEVAQUIN) 750 MG tablet Take 1 tablet (750 mg total) by mouth daily.   losartan (COZAAR) 100 MG tablet Take 100 mg by mouth every evening.    Multiple Vitamins-Minerals (PRESERVISION AREDS PO) Take by mouth.   Simethicone (GAS RELIEF PO) Take 1 tablet by mouth daily.   simvastatin (ZOCOR) 10 MG tablet Take 10 mg by mouth every evening.    Vitamin D, Ergocalciferol, (DRISDOL) 1.25 MG (50000 UNIT) CAPS capsule Take 1 capsule by mouth once a week   No current facility-administered medications for this visit. (Other)      REVIEW OF SYSTEMS:    ALLERGIES Allergies  Allergen Reactions   Carbamazepine Hives and Other (See Comments)    headache   Sulfa Antibiotics  Rash    PAST MEDICAL HISTORY Past Medical History:  Diagnosis Date   Arthritis    Chronic diastolic heart failure ([place])    Essential hypertension    GERD (gastroesophageal reflux disease)    Gout    Hiatal hernia    History of endometrial cancer    Sp hysterectomy 2012   Ischemic colitis ([place]) 2016   Myeloproliferative disorder, JAK-2 positive    Obesity    Pneumonia    Polycythemia vera(238.4)    TIA (transient ischemic attack)    Past Surgical History:  Procedure Laterality Date   ABDOMINAL HYSTERECTOMY  09/12/2011   Procedure: HYSTERECTOMY ABDOMINAL;  Surgeon: Janie Morning, MD;  Location: [facility];  Service: Gynecology;  Laterality: N/A;  Total Abdominal Hysterectomy, Bilateral Salpingo Oophorectomy   BREAST LUMPECTOMY Left 07/10/2017   LEFT BREAST LUMPECTOMY WITH RADIOACTIVE SEED AND SENTINEL LYMPH NODE BIOPSY    BREAST LUMPECTOMY WITH RADIOACTIVE SEED AND  SENTINEL LYMPH NODE BIOPSY Left 07/10/2017   Procedure: LEFT BREAST LUMPECTOMY WITH RADIOACTIVE SEED AND SENTINEL LYMPH NODE BIOPSY;  Surgeon: Rolm Bookbinder, MD;  Location: [facility];  Service: General;  Laterality: Left;   COLONOSCOPY  07/05/2012   Procedure: COLONOSCOPY;  Surgeon: Rogene Houston, MD;  Location: [facility];  Service: Endoscopy;  Laterality: N/A;  730   EYE SURGERY     bilateral 8 - 12 yrs ago   FLEXIBLE SIGMOIDOSCOPY N/A 04/03/2015   Procedure: FLEXIBLE SIGMOIDOSCOPY;  Surgeon: Rogene Houston, MD;  Location: [facility];  Service: Endoscopy;  Laterality: N/A;   HYSTEROSCOPY WITH D & C  08/15/2011   Procedure: DILATATION AND CURETTAGE (D&C) /HYSTEROSCOPY;  Surgeon: Jonnie Kind, MD;  Location: [facility];  Service: Gynecology;  Laterality: N/A;  With Suction Curette   JOINT REPLACEMENT     left knee replaced 2014   KNEE ARTHROPLASTY Left 03/15/2015   Procedure: COMPUTER ASSISTED TOTAL KNEE ARTHROPLASTY;  Surgeon: Marybelle Killings, MD;  Location: [facility];  Service: Orthopedics;  Laterality: Left;   SALPINGOOPHORECTOMY  09/12/2011   Procedure: SALPINGO OOPHERECTOMY;  Surgeon: Janie Morning, MD;  Location: [facility];  Service: Gynecology;  Laterality: Bilateral;    FAMILY HISTORY Family History  Problem Relation Age of Onset   Hypertension Mother    Lung cancer Father    Arthritis/Rheumatoid Sister    Anesthesia problems Neg Hx    Hypotension Neg Hx    Malignant hyperthermia Neg Hx    Pseudochol deficiency Neg Hx     SOCIAL HISTORY Social History   Tobacco Use   Smoking status: Never   Smokeless tobacco: Never  Vaping Use   Vaping Use: Never used  Substance Use Topics   Alcohol use: No   Drug use: No         OPHTHALMIC EXAM:  Base Eye Exam     Visual Acuity (ETDRS)       Right Left   Dist [place] 20/25 -1 20/40 -2   Dist ph [place]  NI         Tonometry (Tonopen, 2:04 PM)       Right Left   Pressure 14 12         Pupils       Pupils Dark Light APD    Right PERRL 3 2 None   Left PERRL 3 2 None         Visual Fields       Left Right    Full Full  Extraocular Movement       Right Left    Full, Ortho Full, Ortho         Neuro/Psych     Oriented x3: Yes   Mood/Affect: Normal         Dilation     Both eyes: 1.0% Mydriacyl, 2.5% Phenylephrine @ 2:04 PM           Slit Lamp and Fundus Exam     External Exam       Right Left   External Normal Normal         Slit Lamp Exam       Right Left   Lids/Lashes Normal Normal   Conjunctiva/Sclera White and quiet White and quiet   Cornea Clear Clear   Anterior Chamber Deep and quiet Deep and quiet   Iris Round and reactive Round and reactive   Lens Posterior chamber intraocular lens Posterior chamber intraocular lens   Anterior Vitreous Normal Normal         Fundus Exam       Right Left   Posterior Vitreous Posterior vitreous detachment Posterior vitreous detachment   Disc Normal.  Normal   C/D Ratio 0.15 0.25   Macula Hard drusen, no hemorrhage, no macular thickening no macular thickening clinically, , Retinal pigment epithelial mottling, Hard drusen, no exudates, Pigmented atrophy   Vessels Normal Normal   Periphery Subretinal hemorrhage, along the superotemporal arcade Normal            IMAGING AND PROCEDURES  Imaging and Procedures for 11/15/21  Intravitreal Injection, Pharmacologic Agent - OS - Left Eye       Time Out 11/15/2021. 3:04 PM. Confirmed correct patient, procedure, site, and patient consented.   Anesthesia Topical anesthesia was used. Anesthetic medications included Lidocaine 4%.   Procedure Preparation included Tobramycin 0.3%, 10% betadine to eyelids, 5% betadine to ocular surface. A 30 gauge needle was used.   Injection: 2.5 mg bevacizumab 2.5 MG/0.1ML   Route: Intravitreal, Site: Left Eye   NDC: [phone], Lot: [id number]   Post-op Post injection exam found visual acuity of at least counting fingers. The  patient tolerated the procedure well. There were no complications. The patient received written and verbal post procedure care education. Post injection medications included ocuflox.      OCT, Retina - OU - Both Eyes       Right Eye Quality was good. Scan locations included subfoveal. Central Foveal Thickness: 253. Progression has been stable. Findings include no SRF, no IRF.   Left Eye Quality was good. Scan locations included subfoveal. Central Foveal Thickness: 255. Progression has improved. Findings include no IRF, subretinal fluid, abnormal foveal contour.   Notes OS today at 8 weeks post injection we planned for repeat injection today, residual subretinal fluid today at this longer interval.  Repeat injection today and maintain interval examination OS next 8 weeks   OD today now 8 months post last injection stable no sign of CNVM             ASSESSMENT/PLAN:  Exudative age-related macular degeneration of left eye with active choroidal neovascularization (HCC)  Recent interval examination 10 weeks resulted in subretinal fluid see findings on 09-27-2021  Vastly improved macular findings today at shorter interval of 8 weeks.  No residual subretinal fluid today. We will maintain 8-week follow-up interval after intravitreal injection today  Exudative age-related macular degeneration of right eye with inactive choroidal neovascularization (HCC) OD stable no sign of CNVM recurrent  ICD-10-CM   1. Exudative age-related macular degeneration of left eye with active choroidal neovascularization (HCC)  H35.3221 Intravitreal Injection, Pharmacologic Agent - OS - Left Eye    OCT, Retina - OU - Both Eyes    bevacizumab (AVASTIN) SOSY 2.5 mg    2. Exudative age-related macular degeneration of right eye with inactive choroidal neovascularization ([place])  H35.3212       1.  2.  3.  Ophthalmic Meds Ordered this visit:  Meds ordered this encounter  Medications   bevacizumab  (AVASTIN) SOSY 2.5 mg       Return in about 8 weeks (around 01/10/2022) for DILATE OU, AVASTIN OCT, OS.  There are no Patient Instructions on file for this visit.   Explained the diagnoses, plan, and follow up with the patient and they expressed understanding.  Patient expressed understanding of the importance of proper follow up care.   Clent Demark Zen Cedillos M.D. Diseases & Surgery of the Retina and Vitreous Retina & Diabetic [place] 11/15/21     Abbreviations: M myopia (nearsighted); A astigmatism; H hyperopia (farsighted); P presbyopia; Mrx spectacle prescription;  CTL contact lenses; OD right eye; OS left eye; OU both eyes  XT exotropia; ET esotropia; PEK punctate epithelial keratitis; PEE punctate epithelial erosions; DES dry eye syndrome; MGD meibomian gland dysfunction; ATs artificial tears; PFAT's preservative free artificial tears; [place] nuclear sclerotic cataract; PSC posterior subcapsular cataract; ERM epi-retinal membrane; PVD posterior vitreous detachment; RD retinal detachment; DM diabetes mellitus; DR diabetic retinopathy; NPDR non-proliferative diabetic retinopathy; PDR proliferative diabetic retinopathy; CSME clinically significant macular edema; DME diabetic macular edema; dbh dot blot hemorrhages; CWS cotton wool spot; POAG primary open angle glaucoma; C/D cup-to-disc ratio; HVF humphrey visual field; GVF goldmann visual field; OCT optical coherence tomography; IOP intraocular pressure; BRVO Branch retinal vein occlusion; CRVO central retinal vein occlusion; CRAO central retinal artery occlusion; BRAO branch retinal artery occlusion; RT retinal tear; SB scleral buckle; PPV pars plana vitrectomy; VH Vitreous hemorrhage; PRP panretinal laser photocoagulation; IVK intravitreal kenalog; VMT vitreomacular traction; MH Macular hole;  NVD neovascularization of the disc; NVE neovascularization elsewhere; AREDS age related eye disease study; ARMD age related macular degeneration; POAG primary  open angle glaucoma; EBMD epithelial/anterior basement membrane dystrophy; ACIOL anterior chamber intraocular lens; IOL intraocular lens; PCIOL posterior chamber intraocular lens; Phaco/IOL phacoemulsification with intraocular lens placement; [place] photorefractive keratectomy; LASIK laser assisted in situ keratomileusis; HTN hypertension; DM diabetes mellitus; COPD chronic obstructive pulmonary disease

## 2021-11-15 NOTE — Assessment & Plan Note (Signed)
OD stable no sign of CNVM recurrent

## 2021-12-03 DIAGNOSIS — J9601 Acute respiratory failure with hypoxia: Secondary | ICD-10-CM | POA: Diagnosis not present

## 2021-12-03 DIAGNOSIS — J449 Chronic obstructive pulmonary disease, unspecified: Secondary | ICD-10-CM | POA: Diagnosis not present

## 2021-12-03 DIAGNOSIS — Z96652 Presence of left artificial knee joint: Secondary | ICD-10-CM | POA: Diagnosis not present

## 2021-12-13 NOTE — Progress Notes (Signed)
Reviewed at 05/30/21 visit by Dede Query. PAC

## 2021-12-22 ENCOUNTER — Other Ambulatory Visit (HOSPITAL_COMMUNITY): Payer: Self-pay | Admitting: Physician Assistant

## 2021-12-22 DIAGNOSIS — C50412 Malignant neoplasm of upper-outer quadrant of left female breast: Secondary | ICD-10-CM

## 2021-12-22 DIAGNOSIS — Z170 Estrogen receptor positive status [ER+]: Secondary | ICD-10-CM

## 2022-01-03 DIAGNOSIS — J449 Chronic obstructive pulmonary disease, unspecified: Secondary | ICD-10-CM | POA: Diagnosis not present

## 2022-01-03 DIAGNOSIS — Z96652 Presence of left artificial knee joint: Secondary | ICD-10-CM | POA: Diagnosis not present

## 2022-01-03 DIAGNOSIS — J9601 Acute respiratory failure with hypoxia: Secondary | ICD-10-CM | POA: Diagnosis not present

## 2022-01-05 DIAGNOSIS — Z79899 Other long term (current) drug therapy: Secondary | ICD-10-CM | POA: Diagnosis not present

## 2022-01-05 DIAGNOSIS — I10 Essential (primary) hypertension: Secondary | ICD-10-CM | POA: Diagnosis not present

## 2022-01-05 DIAGNOSIS — I5033 Acute on chronic diastolic (congestive) heart failure: Secondary | ICD-10-CM | POA: Diagnosis not present

## 2022-01-05 DIAGNOSIS — E785 Hyperlipidemia, unspecified: Secondary | ICD-10-CM | POA: Diagnosis not present

## 2022-01-10 ENCOUNTER — Encounter: Payer: Self-pay | Admitting: Cardiology

## 2022-01-10 ENCOUNTER — Other Ambulatory Visit: Payer: Self-pay

## 2022-01-10 ENCOUNTER — Ambulatory Visit: Payer: Medicare Other | Admitting: Cardiology

## 2022-01-10 VITALS — BP 144/84 | HR 65 | Ht 64.0 in | Wt 246.0 lb

## 2022-01-10 DIAGNOSIS — I259 Chronic ischemic heart disease, unspecified: Secondary | ICD-10-CM

## 2022-01-10 DIAGNOSIS — I38 Endocarditis, valve unspecified: Secondary | ICD-10-CM | POA: Diagnosis not present

## 2022-01-10 DIAGNOSIS — I272 Pulmonary hypertension, unspecified: Secondary | ICD-10-CM | POA: Diagnosis not present

## 2022-01-10 NOTE — Patient Instructions (Signed)
Medication Instructions:  ?Your physician recommends that you continue on your current medications as directed. Please refer to the Current Medication list given to you today. ? ? ?Labwork: ?None today ? ?Testing/Procedures: ?None today ? ?Follow-Up: ?3 months ? ?Any Other Special Instructions Will Be Listed Below (If Applicable). ? ?If you need a refill on your cardiac medications before your next appointment, please call your pharmacy. ? ?

## 2022-01-10 NOTE — Progress Notes (Signed)
Cardiology Office Note  Date: 01/10/2022   ID: Lori, Stafford August 21, 1941, MRN [id number]  PCP:  [name], MD  Cardiologist:  Rozann Lesches, MD Electrophysiologist:  None   Chief Complaint  Patient presents with   Cardiac follow-up    History of Present Illness: [name] is an 81 y.o. female last seen in November 2022.  She is here today with her daughter for a follow-up visit.  Since we increased her Lasix, leg swelling has been perhaps a little bit better, but she is still using compression stockings, weight is stable.  She just had recent lab work with Dr. Willey Blade which we are requesting for review.  There may be room for further up titration of diuretics.  She is using a rolling walker, denies any recent falls.  We went over her medications which are otherwise stable and outlined below.  Past Medical History:  Diagnosis Date   Arthritis    Chronic diastolic heart failure ([place])    Essential hypertension    GERD (gastroesophageal reflux disease)    Gout    Hiatal hernia    History of endometrial cancer    Sp hysterectomy 2012   Ischemic colitis ([place]) 2016   Myeloproliferative disorder, JAK-2 positive    Obesity    Pneumonia    Polycythemia vera(238.4)    TIA (transient ischemic attack)     Past Surgical History:  Procedure Laterality Date   ABDOMINAL HYSTERECTOMY  09/12/2011   Procedure: HYSTERECTOMY ABDOMINAL;  Surgeon: Janie Morning, MD;  [facility];  Service: Gynecology;  Laterality: N/A;  Total Abdominal Hysterectomy, Bilateral Salpingo Oophorectomy   BREAST LUMPECTOMY Left 07/10/2017   LEFT BREAST LUMPECTOMY WITH RADIOACTIVE SEED AND SENTINEL LYMPH NODE BIOPSY    BREAST LUMPECTOMY WITH RADIOACTIVE SEED AND SENTINEL LYMPH NODE BIOPSY Left 07/10/2017   Procedure: LEFT BREAST LUMPECTOMY WITH RADIOACTIVE SEED AND SENTINEL LYMPH NODE BIOPSY;  Surgeon: Rolm Bookbinder, MD;  Location: [facility];  Service: General;  Laterality: Left;   COLONOSCOPY   07/05/2012   Procedure: COLONOSCOPY;  Surgeon: Rogene Houston, MD;  [facility];  Service: Endoscopy;  Laterality: N/A;  730   EYE SURGERY     bilateral 8 - 12 yrs ago   FLEXIBLE SIGMOIDOSCOPY N/A 04/03/2015   Procedure: FLEXIBLE SIGMOIDOSCOPY;  Surgeon: Rogene Houston, MD;  [facility];  Service: Endoscopy;  Laterality: N/A;   HYSTEROSCOPY WITH D & C  08/15/2011   Procedure: DILATATION AND CURETTAGE (D&C) /HYSTEROSCOPY;  Surgeon: Jonnie Kind, MD;  [facility];  Service: Gynecology;  Laterality: N/A;  With Suction Curette   JOINT REPLACEMENT     left knee replaced 2014   KNEE ARTHROPLASTY Left 03/15/2015   Procedure: COMPUTER ASSISTED TOTAL KNEE ARTHROPLASTY;  Surgeon: Marybelle Killings, MD;  Location: [place];  Service: Orthopedics;  Laterality: Left;   SALPINGOOPHORECTOMY  09/12/2011   Procedure: SALPINGO OOPHERECTOMY;  Surgeon: Janie Morning, MD;  [facility];  Service: Gynecology;  Laterality: Bilateral;    Current Outpatient Medications  Medication Sig Dispense Refill   aspirin EC 81 MG tablet Take 81 mg by mouth at bedtime.      carvedilol (COREG) 12.5 MG tablet Take 12.5 mg by mouth 2 (two) times daily with a meal. Take 1/2 tablet by 2 times daily     cloNIDine (CATAPRES) 0.3 MG tablet Take 0.15 mg by mouth 2 (two) times daily.     diltiazem (CARDIZEM CD) 180 MG  24 hr capsule Take 180 mg by mouth daily.      doxylamine, Sleep, (SLEEP AID) 25 MG tablet Take 25 mg by mouth at bedtime as needed.     esomeprazole (NEXIUM) 40 MG capsule Take 1 capsule (40 mg total) by mouth every morning. 30 capsule 11   furosemide (LASIX) 40 MG tablet Take 1.5 tablets (60 mg total) by mouth daily. 45 tablet 3   hydroxyurea (HYDREA) 500 MG capsule Take 2 tablets on Monday, Wednesday and Friday and 1 tablet daily rest of the week.  May take with food to minimize GI side effects. 90 capsule 3   isosorbide mononitrate (IMDUR) 30 MG 24 hr tablet Take 15 mg by mouth daily.      letrozole (FEMARA) 2.5 MG tablet TAKE 1 TABLET BY MOUTH ONCE DAILY . APPOINTMENT REQUIRED FOR FUTURE REFILLS 30 tablet 6   losartan (COZAAR) 100 MG tablet Take 100 mg by mouth every evening.      Multiple Vitamins-Minerals (PRESERVISION AREDS PO) Take by mouth.     Simethicone (GAS RELIEF PO) Take 1 tablet by mouth daily.     simvastatin (ZOCOR) 10 MG tablet Take 10 mg by mouth every evening.      Vitamin D, Ergocalciferol, (DRISDOL) 1.25 MG (50000 UNIT) CAPS capsule Take 1 capsule by mouth once a week 12 capsule 0   levofloxacin (LEVAQUIN) 750 MG tablet Take 1 tablet (750 mg total) by mouth daily. (Patient not taking: Reported on 01/10/2022) 7 tablet 0   No current facility-administered medications for this visit.   Allergies:  Carbamazepine and Sulfa antibiotics   ROS: No palpitations or syncope.  Physical Exam: VS:  BP (!) 144/84    Pulse 65    Ht '5\' 4"'$  (1.626 m)    Wt 246 lb (111.6 kg)    SpO2 91%    BMI 42.23 kg/m , BMI Body mass index is 42.23 kg/m.  Wt Readings from Last 3 Encounters:  01/10/22 246 lb (111.6 kg)  10/10/21 247 lb 9.2 oz (112.3 kg)  09/15/21 248 lb 12.8 oz (112.9 kg)    General: Patient appears comfortable at rest. HEENT: Conjunctiva and lids normal, wearing a mask. Neck: Supple, no elevated JVP or carotid bruits, no thyromegaly. Lungs: Clear to auscultation, nonlabored breathing at rest. Cardiac: Regular rate and rhythm, no S3, 9-8/2 basal systolic murmur. Extremities: Chronic appearing leg edema with compression stockings in place.  ECG:  An ECG dated 04/25/2021 was personally reviewed today and demonstrated:  Sinus rhythm with right bundle branch block.  Recent Labwork: 04/25/2021: B Natriuretic Peptide 57.0 10/03/2021: ALT 11; AST 18; BUN 21; Creatinine, Ser 0.65; Hemoglobin 15.1; Platelets 447; Potassium 4.3; Sodium 139   Other Studies Reviewed Today:  Lexiscan Myoview 08/16/2021: Findings are consistent with ischemia. The study is intermediate risk  given TID of 1.21 and inferolateral partially reversible defect.   The study is intermediate risk.   No ST deviation was noted.   LV perfusion is abnormal. There is evidence of ischemia in the inferolateral/apical region. There is no evidence of infarction.   Defect 1: There is a medium defect with moderate reduction in uptake present in the apical to basal inferolateral location(s) that is partially reversible. There is normal wall motion in the defect area. Consistent with ischemia.   Left ventricular function is normal. End diastolic cavity size is normal. Evidence of transient ischemic dilation (TID) noted. TID was appreciated quantitatively but not visually.  Echocardiogram 09/13/2021:  1. Left ventricular ejection fraction,  by estimation, is 65 to 70%. The  left ventricle has normal function. The left ventricle has no regional  wall motion abnormalities. There is moderate left ventricular hypertrophy.  Left ventricular diastolic  parameters are indeterminate.   2. Right ventricular systolic function is normal. The right ventricular  size is normal. There is severely elevated pulmonary artery systolic  pressure.   3. Left atrial size was severely dilated.   4. The mitral valve is abnormal. No evidence of mitral valve  regurgitation. Mild to moderate mitral stenosis. Moderate mitral annular  calcification.   5. The tricuspid valve is abnormal.   6. The aortic valve is tricuspid. There is moderate calcification of the  aortic valve. There is moderate thickening of the aortic valve. Aortic  valve regurgitation is not visualized. Mild aortic valve stenosis. Aortic  valve mean gradient measures 10.0  mmHg. Aortic valve peak gradient measures 22.7 mmHg. Aortic valve area, by  VTI measures 2.11 cm   7. The inferior vena cava is dilated in size with >50% respiratory  variability, suggesting right atrial pressure of 8 mmHg.   Assessment and Plan:  1.  Severe pulmonary hypertension, likely  WHO group 2 with chronic peripheral edema.  Currently on Lasix 60 mg daily, we will request recent lab work for review and consider increasing dose further.  May need a potassium supplement but her last level was normal.  2.  Valvular heart disease including mild to moderate mitral stenosis and mild aortic stenosis.  3.  Ischemic heart disease based on prior Myoview, no definite angina symptoms with current level of activity.  Continue aspirin, Cozaar, Coreg, Zocor and Imdur.  Medication Adjustments/Labs and Tests Ordered: Current medicines are reviewed at length with the patient today.  Concerns regarding medicines are outlined above.   Tests Ordered: No orders of the defined types were placed in this encounter.   Medication Changes: No orders of the defined types were placed in this encounter.   Disposition:  Follow up  3 months.  Signed, Satira Sark, MD, [facility] 01/10/2022 2:34 PM    [facility] at [facility] [address], [place], [place] [address] Phone: [phone]; Fax: [phone]

## 2022-01-11 ENCOUNTER — Telehealth: Payer: Self-pay | Admitting: *Deleted

## 2022-01-11 DIAGNOSIS — Z79899 Other long term (current) drug therapy: Secondary | ICD-10-CM

## 2022-01-11 MED ORDER — POTASSIUM CHLORIDE ER 10 MEQ PO TBCR: 10.0000 meq | EXTENDED_RELEASE_TABLET | Freq: Every day | ORAL | 3 refills | Status: DC

## 2022-01-11 MED ORDER — FUROSEMIDE 80 MG PO TABS: 80.0000 mg | ORAL_TABLET | Freq: Every day | ORAL | 3 refills | Status: DC

## 2022-01-11 NOTE — Telephone Encounter (Signed)
Pt notified and orders placed  

## 2022-01-11 NOTE — Telephone Encounter (Signed)
-----   Message from Satira Sark, MD sent at 01/10/2022  4:48 PM EST ----- ?Results reviewed.  Renal function looks good with creatinine 0.6 and potassium normal at 4.6.  Would increase Lasix to 80 mg daily and start KCl 10 mEq daily.  Can follow-up BM[facility] for next visit. ?

## 2022-01-12 ENCOUNTER — Encounter (INDEPENDENT_AMBULATORY_CARE_PROVIDER_SITE_OTHER): Payer: Medicare Other | Admitting: Ophthalmology

## 2022-01-16 ENCOUNTER — Other Ambulatory Visit (HOSPITAL_COMMUNITY)
Admission: RE | Admit: 2022-01-16 | Discharge: 2022-01-16 | Disposition: A | Discharge status: Home / Self Care | Payer: Medicare Other | Source: Ambulatory Visit | Attending: Cardiology | Admitting: Cardiology

## 2022-01-16 ENCOUNTER — Inpatient Hospital Stay (HOSPITAL_COMMUNITY): Payer: Medicare Other | Attending: Hematology

## 2022-01-16 DIAGNOSIS — R0609 Other forms of dyspnea: Secondary | ICD-10-CM | POA: Insufficient documentation

## 2022-01-16 DIAGNOSIS — R634 Abnormal weight loss: Secondary | ICD-10-CM | POA: Diagnosis not present

## 2022-01-16 DIAGNOSIS — Z79899 Other long term (current) drug therapy: Secondary | ICD-10-CM | POA: Diagnosis not present

## 2022-01-16 DIAGNOSIS — M79602 Pain in left arm: Secondary | ICD-10-CM | POA: Insufficient documentation

## 2022-01-16 DIAGNOSIS — D473 Essential (hemorrhagic) thrombocythemia: Secondary | ICD-10-CM | POA: Diagnosis not present

## 2022-01-16 DIAGNOSIS — R252 Cramp and spasm: Secondary | ICD-10-CM | POA: Insufficient documentation

## 2022-01-16 DIAGNOSIS — Z8249 Family history of ischemic heart disease and other diseases of the circulatory system: Secondary | ICD-10-CM | POA: Diagnosis not present

## 2022-01-16 DIAGNOSIS — R519 Headache, unspecified: Secondary | ICD-10-CM | POA: Insufficient documentation

## 2022-01-16 DIAGNOSIS — Z8673 Personal history of transient ischemic attack (TIA), and cerebral infarction without residual deficits: Secondary | ICD-10-CM | POA: Insufficient documentation

## 2022-01-16 DIAGNOSIS — G479 Sleep disorder, unspecified: Secondary | ICD-10-CM | POA: Insufficient documentation

## 2022-01-16 DIAGNOSIS — Z8261 Family history of arthritis: Secondary | ICD-10-CM | POA: Insufficient documentation

## 2022-01-16 DIAGNOSIS — Z882 Allergy status to sulfonamides status: Secondary | ICD-10-CM | POA: Diagnosis not present

## 2022-01-16 DIAGNOSIS — D75839 Thrombocytosis, unspecified: Secondary | ICD-10-CM | POA: Diagnosis not present

## 2022-01-16 DIAGNOSIS — R0602 Shortness of breath: Secondary | ICD-10-CM | POA: Diagnosis not present

## 2022-01-16 DIAGNOSIS — Z801 Family history of malignant neoplasm of trachea, bronchus and lung: Secondary | ICD-10-CM | POA: Diagnosis not present

## 2022-01-16 DIAGNOSIS — Z170 Estrogen receptor positive status [ER+]: Secondary | ICD-10-CM | POA: Diagnosis not present

## 2022-01-16 DIAGNOSIS — D45 Polycythemia vera: Secondary | ICD-10-CM | POA: Insufficient documentation

## 2022-01-16 DIAGNOSIS — R5383 Other fatigue: Secondary | ICD-10-CM | POA: Diagnosis not present

## 2022-01-16 DIAGNOSIS — Z90710 Acquired absence of both cervix and uterus: Secondary | ICD-10-CM | POA: Diagnosis not present

## 2022-01-16 DIAGNOSIS — Z8542 Personal history of malignant neoplasm of other parts of uterus: Secondary | ICD-10-CM | POA: Diagnosis not present

## 2022-01-16 DIAGNOSIS — C50412 Malignant neoplasm of upper-outer quadrant of left female breast: Secondary | ICD-10-CM | POA: Diagnosis not present

## 2022-01-16 DIAGNOSIS — E559 Vitamin D deficiency, unspecified: Secondary | ICD-10-CM | POA: Diagnosis not present

## 2022-01-16 DIAGNOSIS — Z90721 Acquired absence of ovaries, unilateral: Secondary | ICD-10-CM | POA: Insufficient documentation

## 2022-01-16 LAB — CBC WITH DIFFERENTIAL/PLATELET
Abs Immature Granulocytes: 0.02 10*3/uL (ref 0.00–0.07)
Basophils Absolute: 0.1 10*3/uL (ref 0.0–0.1)
Basophils Relative: 2 %
Eosinophils Absolute: 0.2 10*3/uL (ref 0.0–0.5)
Eosinophils Relative: 3 %
HCT: 45.4 % (ref 36.0–46.0)
Hemoglobin: 14.3 g/dL (ref 12.0–15.0)
Immature Granulocytes: 0 %
Lymphocytes Relative: 23 %
Lymphs Abs: 1.6 10*3/uL (ref 0.7–4.0)
MCH: 33 pg (ref 26.0–34.0)
MCHC: 31.5 g/dL (ref 30.0–36.0)
MCV: 104.8 fL — ABNORMAL HIGH (ref 80.0–100.0)
Monocytes Absolute: 0.6 10*3/uL (ref 0.1–1.0)
Monocytes Relative: 9 %
Neutro Abs: 4.5 10*3/uL (ref 1.7–7.7)
Neutrophils Relative %: 63 %
Platelets: 448 10*3/uL — ABNORMAL HIGH (ref 150–400)
RBC: 4.33 MIL/uL (ref 3.87–5.11)
RDW: 14.4 % (ref 11.5–15.5)
WBC: 7.1 10*3/uL (ref 4.0–10.5)
nRBC: 0 % (ref 0.0–0.2)

## 2022-01-16 LAB — LACTATE DEHYDROGENASE: LDH: 130 U/L (ref 98–192)

## 2022-01-16 LAB — COMPREHENSIVE METABOLIC PANEL
ALT: 12 U/L (ref 0–44)
AST: 15 U/L (ref 15–41)
Albumin: 3.8 g/dL (ref 3.5–5.0)
Alkaline Phosphatase: 84 U/L (ref 38–126)
Anion gap: 8 (ref 5–15)
BUN: 21 mg/dL (ref 8–23)
CO2: 32 mmol/L (ref 22–32)
Calcium: 9.4 mg/dL (ref 8.9–10.3)
Chloride: 102 mmol/L (ref 98–111)
Creatinine, Ser: 0.63 mg/dL (ref 0.44–1.00)
GFR, Estimated: >60 mL/min (ref >60)
Glucose, Bld: 102 mg/dL — ABNORMAL HIGH (ref 70–99)
Potassium: 4.3 mmol/L (ref 3.5–5.1)
Sodium: 142 mmol/L (ref 135–145)
Total Bilirubin: 0.9 mg/dL (ref 0.3–1.2)
Total Protein: 7.3 g/dL (ref 6.5–8.1)

## 2022-01-17 ENCOUNTER — Other Ambulatory Visit: Payer: Self-pay

## 2022-01-17 ENCOUNTER — Ambulatory Visit (INDEPENDENT_AMBULATORY_CARE_PROVIDER_SITE_OTHER): Payer: Medicare Other | Admitting: Ophthalmology

## 2022-01-17 ENCOUNTER — Encounter (INDEPENDENT_AMBULATORY_CARE_PROVIDER_SITE_OTHER): Payer: Self-pay | Admitting: Ophthalmology

## 2022-01-17 DIAGNOSIS — H353221 Exudative age-related macular degeneration, left eye, with active choroidal neovascularization: Secondary | ICD-10-CM | POA: Diagnosis not present

## 2022-01-17 DIAGNOSIS — H353212 Exudative age-related macular degeneration, right eye, with inactive choroidal neovascularization: Secondary | ICD-10-CM | POA: Diagnosis not present

## 2022-01-17 MED ORDER — BEVACIZUMAB 2.5 MG/0.1ML IZ SOSY
2.5000 mg | PREFILLED_SYRINGE | INTRAVITREAL | Status: AC | PRN
  Administered 2022-01-17: 2.5 mg via INTRAVITREAL

## 2022-01-17 NOTE — Progress Notes (Signed)
? ? ?01/17/2022 ? ?  ? ?CHIEF COMPLAINT ?Patient presents for  ?Chief Complaint  ?Patient presents with  ? Macular Degeneration  ? ? ? ? ?HISTORY OF PRESENT ILLNESS: ?[name] is a 81 y.o. female who presents to the clinic today for:  ? ?HPI   ?9 week fu oct avastin os. ?Pt states no recent changes in vision and medical history. ?Pt denies floaters and FOL. ? ? ? ?Last edited by Silvestre Moment on 01/17/2022  1:41 PM.  ?  ? ? ?Referring physician: ?Rutherford Guys, MD ?[address] ?[place],  [place] [address] ? ?HISTORICAL INFORMATION:  ? ?Selected notes from the [facility] ?  ?   ? ?CURRENT MEDICATIONS: ?No current outpatient medications on file. (Ophthalmic Drugs)  ? ?No current facility-administered medications for this visit. (Ophthalmic Drugs)  ? ?Current Outpatient Medications (Other)  ?Medication Sig  ? aspirin EC 81 MG tablet Take 81 mg by mouth at bedtime.   ? carvedilol (COREG) 12.5 MG tablet Take 12.5 mg by mouth 2 (two) times daily with a meal. Take 1/2 tablet by 2 times daily  ? cloNIDine (CATAPRES) 0.3 MG tablet Take 0.15 mg by mouth 2 (two) times daily.  ? diltiazem (CARDIZEM CD) 180 MG 24 hr capsule Take 180 mg by mouth daily.   ? doxylamine, Sleep, (SLEEP AID) 25 MG tablet Take 25 mg by mouth at bedtime as needed.  ? esomeprazole (NEXIUM) 40 MG capsule Take 1 capsule (40 mg total) by mouth every morning.  ? furosemide (LASIX) 80 MG tablet Take 1 tablet (80 mg total) by mouth daily.  ? hydroxyurea (HYDREA) 500 MG capsule Take 2 tablets on Monday, Wednesday and Friday and 1 tablet daily rest of the week.  May take with food to minimize GI side effects.  ? isosorbide mononitrate (IMDUR) 30 MG 24 hr tablet Take 15 mg by mouth daily.  ? letrozole (FEMARA) 2.5 MG tablet TAKE 1 TABLET BY MOUTH ONCE DAILY . APPOINTMENT REQUIRED FOR FUTURE REFILLS  ? levofloxacin (LEVAQUIN) 750 MG tablet Take 1 tablet (750 mg total) by mouth daily. (Patient not taking: Reported on 01/10/2022)  ? losartan (COZAAR) 100 MG  tablet Take 100 mg by mouth every evening.   ? Multiple Vitamins-Minerals (PRESERVISION AREDS PO) Take by mouth.  ? potassium chloride (KLOR-CON) 10 MEQ tablet Take 1 tablet (10 mEq total) by mouth daily.  ? Simethicone (GAS RELIEF PO) Take 1 tablet by mouth daily.  ? simvastatin (ZOCOR) 10 MG tablet Take 10 mg by mouth every evening.   ? Vitamin D, Ergocalciferol, (DRISDOL) 1.25 MG (50000 UNIT) CAPS capsule Take 1 capsule by mouth once a week  ? ?No current facility-administered medications for this visit. (Other)  ? ? ? ? ?REVIEW OF SYSTEMS: ?ROS   ?Negative for: Constitutional, Gastrointestinal, Neurological, Skin, Genitourinary, Musculoskeletal, HENT, Endocrine, Cardiovascular, Eyes, Respiratory, Psychiatric, Allergic/Imm, Heme/Lymph ?Last edited by Silvestre Moment on 01/17/2022  1:41 PM.  ?  ? ? ? ?ALLERGIES ?Allergies  ?Allergen Reactions  ? Carbamazepine Hives and Other (See Comments)  ?  headache  ? Sulfa Antibiotics   ?  Rash  ? ? ?PAST MEDICAL HISTORY ?Past Medical History:  ?Diagnosis Date  ? Arthritis   ? Chronic diastolic heart failure ([place])   ? Essential hypertension   ? GERD (gastroesophageal reflux disease)   ? Gout   ? Hiatal hernia   ? History of endometrial cancer   ? Sp hysterectomy 2012  ? Ischemic colitis ([place]) 2016  ?  Myeloproliferative disorder, JAK-2 positive   ? Obesity   ? Pneumonia   ? Polycythemia vera(238.4)   ? TIA (transient ischemic attack)   ? ?Past Surgical History:  ?Procedure Laterality Date  ? ABDOMINAL HYSTERECTOMY  09/12/2011  ? Procedure: HYSTERECTOMY ABDOMINAL;  Surgeon: Janie Morning, MD;  [facility];  Service: Gynecology;  Laterality: N/A;  Total Abdominal Hysterectomy, Bilateral Salpingo Oophorectomy  ? BREAST LUMPECTOMY Left 07/10/2017  ? LEFT BREAST LUMPECTOMY WITH RADIOACTIVE SEED AND SENTINEL LYMPH NODE BIOPSY   ? BREAST LUMPECTOMY WITH RADIOACTIVE SEED AND SENTINEL LYMPH NODE BIOPSY Left 07/10/2017  ? Procedure: LEFT BREAST LUMPECTOMY WITH RADIOACTIVE SEED AND SENTINEL  LYMPH NODE BIOPSY;  Surgeon: Rolm Bookbinder, MD;  Location: [place];  Service: General;  Laterality: Left;  ? COLONOSCOPY  07/05/2012  ? Procedure: COLONOSCOPY;  Surgeon: Rogene Houston, MD;  [facility];  Service: Endoscopy;  Laterality: N/A;  730  ? EYE SURGERY    ? bilateral 8 - 12 yrs ago  ? FLEXIBLE SIGMOIDOSCOPY N/A 04/03/2015  ? Procedure: FLEXIBLE SIGMOIDOSCOPY;  Surgeon: Rogene Houston, MD;  [facility];  Service: Endoscopy;  Laterality: N/A;  ? HYSTEROSCOPY WITH D & C  08/15/2011  ? Procedure: DILATATION AND CURETTAGE (D&C) /HYSTEROSCOPY;  Surgeon: Jonnie Kind, MD;  [facility];  Service: Gynecology;  Laterality: N/A;  With Suction Curette  ? JOINT REPLACEMENT    ? left knee replaced 2014  ? KNEE ARTHROPLASTY Left 03/15/2015  ? Procedure: COMPUTER ASSISTED TOTAL KNEE ARTHROPLASTY;  Surgeon: Marybelle Killings, MD;  Location: [place];  Service: Orthopedics;  Laterality: Left;  ? SALPINGOOPHORECTOMY  09/12/2011  ? Procedure: SALPINGO OOPHERECTOMY;  Surgeon: Janie Morning, MD;  [facility];  Service: Gynecology;  Laterality: Bilateral;  ? ? ?FAMILY HISTORY ?Family History  ?Problem Relation Age of Onset  ? Hypertension Mother   ? Lung cancer Father   ? Arthritis/Rheumatoid Sister   ? Anesthesia problems Neg Hx   ? Hypotension Neg Hx   ? Malignant hyperthermia Neg Hx   ? Pseudochol deficiency Neg Hx   ? ? ?SOCIAL HISTORY ?Social History  ? ?Tobacco Use  ? Smoking status: Never  ? Smokeless tobacco: Never  ?Vaping Use  ? Vaping Use: Never used  ?Substance Use Topics  ? Alcohol use: No  ? Drug use: No  ? ?  ? ?  ? ?OPHTHALMIC EXAM: ? ?Base Eye Exam   ? ? Visual Acuity (ETDRS)   ? ?   Right Left  ? Dist [place] 20/20 -2 20/50  ? Dist ph [place]  NI  ? ?  ?  ? ? Tonometry (Tonopen, 1:47 PM)   ? ?   Right Left  ? Pressure 7 9  ? ?  ?  ? ? Pupils   ? ?   Pupils Dark Light Shape React APD  ? Right PERRL 3 2 Round Slow None  ? Left PERRL 3 2 Round Slow None  ? ?  ?  ? ? Visual Fields   ? ?   Left  Right  ?  Full Full  ? ?  ?  ? ? Extraocular Movement   ? ?   Right Left  ?  Full Full  ? ?  ?  ? ? Neuro/Psych   ? ? Oriented x3: Yes  ? Mood/Affect: Normal  ? ?  ?  ? ? Dilation   ? ? Left eye: 1.0% Mydriacyl, 2.5% Phenylephrine @ 1:47  PM  ? ?  ?  ? ?  ? ?Slit Lamp and Fundus Exam   ? ? External Exam   ? ?   Right Left  ? External Normal Normal  ? ?  ?  ? ? Slit Lamp Exam   ? ?   Right Left  ? Lids/Lashes Normal Normal  ? Conjunctiva/Sclera White and quiet White and quiet  ? Cornea Clear Clear  ? Anterior Chamber Deep and quiet Deep and quiet  ? Iris Round and reactive Round and reactive  ? Lens Posterior chamber intraocular lens Posterior chamber intraocular lens  ? Anterior Vitreous Normal Normal  ? ?  ?  ? ? Fundus Exam   ? ?   Right Left  ? Posterior Vitreous Posterior vitreous detachment Posterior vitreous detachment  ? Disc Normal.  Normal  ? C/D Ratio 0.15 0.25  ? Macula Hard drusen, no hemorrhage, no macular thickening no macular thickening clinically, , Retinal pigment epithelial mottling, Hard drusen, no exudates, Pigmented atrophy  ? Vessels Normal Normal  ? Periphery Subretinal hemorrhage, along the superotemporal arcade Normal  ? ?  ?  ? ?  ? ? ?IMAGING AND PROCEDURES  ?Imaging and Procedures for 01/17/22 ? ?OCT, Retina - OU - Both Eyes   ? ?   ?Right Eye ?Quality was good. Scan locations included subfoveal. Central Foveal Thickness: 257. Progression has been stable. Findings include no SRF, no IRF.  ? ?Left Eye ?Quality was good. Scan locations included subfoveal. Central Foveal Thickness: 256. Progression has been stable. Findings include no IRF, subretinal fluid, abnormal foveal contour.  ? ?Notes ?OS today at 9 weeks post injection we planned for repeat injection today, residual subretinal fluid today at this longer interval.  Repeat injection today and maintain interval examination OS next 9 weeks ? ? ?OD today now  10 months post last injection stable no sign of CNVM ? ?  ? ?Intravitreal  Injection, Pharmacologic Agent - OS - Left Eye   ? ?   ?Time Out ?01/17/2022. 2:31 PM. Confirmed correct patient, procedure, site, and patient consented.  ? ?Anesthesia ?Topical anesthesia was used. Anesthetic m

## 2022-01-17 NOTE — Assessment & Plan Note (Signed)
Resolved and controlled subretinal fluid activity superior to FAZ again at 9-week interval today, recently, November 2022, worsening of fluid at 10-week follow-up interval post Avastin.  We will thus repeat injection today maintain reevaluation next OS in 9 weeks ?

## 2022-01-17 NOTE — Assessment & Plan Note (Signed)
OD now some 10 months off of therapy no sign of recurrence ?

## 2022-01-23 ENCOUNTER — Ambulatory Visit (HOSPITAL_COMMUNITY): Payer: Medicare Other | Admitting: Physician Assistant

## 2022-01-24 DIAGNOSIS — I5020 Unspecified systolic (congestive) heart failure: Secondary | ICD-10-CM | POA: Diagnosis not present

## 2022-01-24 DIAGNOSIS — I10 Essential (primary) hypertension: Secondary | ICD-10-CM | POA: Diagnosis not present

## 2022-01-25 NOTE — Progress Notes (Signed)
? ?[name] ?[address] ?[place], [place] [address] ? ? ?CLINIC:  ?Medical Oncology/Hematology ? ?PCP:  ?[name], MD ?[address] / [place] [address] ?[phone] ? ? ?REASON FOR VISIT:  ?Follow-up for left-sided breast cancer and JAK2 + polycythemia vera/essential thrombocytosis ?  ?PRIOR THERAPY: Left-sided lumpectomy with adjuvant XRT ?  ?CURRENT THERAPY: ?- Letrozole daily since January 2019 ?- Hydrea (current dose 1000 mg M/W/F and 500 mg T/T/S/S) ? ?BRIEF ONCOLOGIC HISTORY:  ?Oncology History  ?Malignant neoplasm of upper-outer quadrant of left breast in female, estrogen receptor positive ([place])  ?07/10/2017 Surgery  ? Left lumpectomy: IDC grade 2, 1.3 cm, intermediate grade DCIS, margins negative, 1/2 lymph nodes positive, ER 5% positive weak staining, PR 0%, HER-2 negative ratio 1.73, Ki-67 15%, T1cN0 stage IA AJCC 8 ?  ?07/10/2017 Miscellaneous  ? Mammaprint: Low risk, 10-year risk of recurrence untreated 10% ?  ?10/01/2017 - 11/14/2017 Radiation Therapy  ? Adj XRT ?  ?11/2017 -  Anti-estrogen oral therapy  ? Letrozole daily ?  ? ? ?CANCER STAGING: ?Cancer Staging  ?Malignant neoplasm of upper-outer quadrant of left breast in female, estrogen receptor positive ([place]) ?Staging form: Breast, AJCC 8th Edition ?- Clinical stage from 05/22/2017: Stage IA (cT1, cN0, cM0, G1, ER+, PR-, HER2-) - Signed by Holley Bouche, NP on 06/22/2017 ?- Pathologic: Stage IIA (pT1c, pN1a, cM0, G2, ER+, PR-, HER2-) - Unsigned ? ? ?INTERVAL HISTORY:  ?[name], a 81 y.o. female, returns for routine follow-up of her left-sided breast cancer and JAK2 + polycythemia vera/essential thrombocytosis. Jaiyanna was last seen on 10/10/2021 by Tarri Abernethy PA-C.  ? ?Regarding her history of left-sided breast cancer s/p lumpectomy and lymph node biopsy, she reports that she has been noticing some pain underneath her left arm for about the past year.  Pain is intermittent, and is described as a  "tightening and pulling pain." ? ?She denies any symptoms of recurrence such as new lumps, bone pain, chest pain, or abdominal pain. ?She reports some chronic dyspnea on exertion, but denies any new shortness of breath or changes in her breathing patterns.  She has no new headaches, seizures, or focal neurologic deficits. ?No B symptoms such as fever, chills, night sweats. ? ?According to weight obtained in clinic, patient has lost 17 pounds in the past 2 weeks.  There is some question as to the accuracy of this, perhaps some discrepancy between different scales.  However, some of this may have been fluid weight, as the patient recently had her dose of Lasix increased.  She reports that her clothes are fitting the same at home.  She does not check her weight at home on a regular basis.  She reports baseline activity level and food intake. ?  ?She continues to take letrozole as prescribed, and is tolerating it well.  She does have leg cramps from time to time.  She denies any hot flashes, mood swings, or abnormal weight gain. ?  ?She denies any aquagenic pruritus, Raynaud's, erythromelalgia. ?No vasomotor symptoms such as dizziness, tinnitus, intermittent blurry vision (she has chronic visual deficit from macular degeneration), strokelike symptoms, or neuropathy. ?  ?Doing tacos taco meat has known to disease, then see (moms, say any of the above are good with [place].  Would you suggest that your she is tolerating Hydrea well without any skin sores, mouth sores, or GI side effects. ? ?She reports 40% energy and 100% appetite.  She is maintaining stable weight at  this time. ? ? ? ?REVIEW OF SYSTEMS:  ?Review of Systems  ?Constitutional:  Positive for fatigue. Negative for appetite change, chills, diaphoresis, fever and unexpected weight change.  ?HENT:   Negative for lump/mass and nosebleeds.   ?Eyes:  Negative for eye problems.  ?Respiratory:  Positive for shortness of breath (with exertion). Negative for cough and  hemoptysis.   ?Cardiovascular:  Negative for chest pain, leg swelling and palpitations.  ?Gastrointestinal:  Negative for abdominal pain, blood in stool, constipation, diarrhea, nausea and vomiting.  ?Genitourinary:  Negative for hematuria.   ?Skin: Negative.   ?Neurological:  Positive for headaches. Negative for dizziness and light-headedness.  ?Hematological:  Does not bruise/bleed easily.  ?Psychiatric/Behavioral:  Positive for sleep disturbance.   ? ?PAST MEDICAL/SURGICAL HISTORY:  ?Past Medical History:  ?Diagnosis Date  ? Arthritis   ? Chronic diastolic heart failure ([place])   ? Essential hypertension   ? GERD (gastroesophageal reflux disease)   ? Gout   ? Hiatal hernia   ? History of endometrial cancer   ? Sp hysterectomy 2012  ? Ischemic colitis ([place]) 2016  ? Myeloproliferative disorder, JAK-2 positive   ? Obesity   ? Pneumonia   ? Polycythemia vera(238.4)   ? TIA (transient ischemic attack)   ? ?Past Surgical History:  ?Procedure Laterality Date  ? ABDOMINAL HYSTERECTOMY  09/12/2011  ? Procedure: HYSTERECTOMY ABDOMINAL;  Surgeon: Janie Morning, MD;  [facility];  Service: Gynecology;  Laterality: N/A;  Total Abdominal Hysterectomy, Bilateral Salpingo Oophorectomy  ? BREAST LUMPECTOMY Left 07/10/2017  ? LEFT BREAST LUMPECTOMY WITH RADIOACTIVE SEED AND SENTINEL LYMPH NODE BIOPSY   ? BREAST LUMPECTOMY WITH RADIOACTIVE SEED AND SENTINEL LYMPH NODE BIOPSY Left 07/10/2017  ? Procedure: LEFT BREAST LUMPECTOMY WITH RADIOACTIVE SEED AND SENTINEL LYMPH NODE BIOPSY;  Surgeon: Rolm Bookbinder, MD;  Location: [facility];  Service: General;  Laterality: Left;  ? COLONOSCOPY  07/05/2012  ? Procedure: COLONOSCOPY;  Surgeon: Rogene Houston, MD;  [facility];  Service: Endoscopy;  Laterality: N/A;  730  ? EYE SURGERY    ? bilateral 8 - 12 yrs ago  ? FLEXIBLE SIGMOIDOSCOPY N/A 04/03/2015  ? Procedure: FLEXIBLE SIGMOIDOSCOPY;  Surgeon: Rogene Houston, MD;  [facility];  Service: Endoscopy;  Laterality:  N/A;  ? HYSTEROSCOPY WITH D & C  08/15/2011  ? Procedure: DILATATION AND CURETTAGE (D&C) /HYSTEROSCOPY;  Surgeon: Jonnie Kind, MD;  [facility];  Service: Gynecology;  Laterality: N/A;  With Suction Curette  ? JOINT REPLACEMENT    ? left knee replaced 2014  ? KNEE ARTHROPLASTY Left 03/15/2015  ? Procedure: COMPUTER ASSISTED TOTAL KNEE ARTHROPLASTY;  Surgeon: Marybelle Killings, MD;  Location: [facility];  Service: Orthopedics;  Laterality: Left;  ? SALPINGOOPHORECTOMY  09/12/2011  ? Procedure: SALPINGO OOPHERECTOMY;  Surgeon: Janie Morning, MD;  [facility];  Service: Gynecology;  Laterality: Bilateral;  ? ? ?SOCIAL HISTORY:  ?Social History  ? ?Socioeconomic History  ? Marital status: Widowed  ?  Spouse name: Not on file  ? Number of children: Not on file  ? Years of education: Not on file  ? Highest education level: Not on file  ?Occupational History  ? Not on file  ?Tobacco Use  ? Smoking status: Never  ? Smokeless tobacco: Never  ?Vaping Use  ? Vaping Use: Never used  ?Substance and Sexual Activity  ? Alcohol use: No  ? Drug use: No  ? Sexual activity: Never  ?  Birth control/protection:  Surgical  ?  Comment: hysterectomy  ?Other Topics Concern  ? Not on file  ?Social History Narrative  ? Not on file  ? ?Social Determinants of Health  ? ?Financial Resource Strain: Not on file  ?Food Insecurity: Not on file  ?Transportation Needs: Not on file  ?Physical Activity: Not on file  ?Stress: Not on file  ?Social Connections: Not on file  ?Intimate Partner Violence: Not on file  ? ? ?FAMILY HISTORY:  ?Family History  ?Problem Relation Age of Onset  ? Hypertension Mother   ? Lung cancer Father   ? Arthritis/Rheumatoid Sister   ? Anesthesia problems Neg Hx   ? Hypotension Neg Hx   ? Malignant hyperthermia Neg Hx   ? Pseudochol deficiency Neg Hx   ? ? ?CURRENT MEDICATIONS:  ?Current Outpatient Medications  ?Medication Sig Dispense Refill  ? aspirin EC 81 MG tablet Take 81 mg by mouth at bedtime.     ? carvedilol (COREG)  12.5 MG tablet Take 12.5 mg by mouth 2 (two) times daily with a meal. Take 1/2 tablet by 2 times daily    ? cloNIDine (CATAPRES) 0.3 MG tablet Take 0.15 mg by mouth 2 (two) times daily.    ? diltiazem (CARDIZEM CD)

## 2022-01-26 ENCOUNTER — Other Ambulatory Visit: Payer: Self-pay

## 2022-01-26 ENCOUNTER — Inpatient Hospital Stay (HOSPITAL_COMMUNITY): Payer: Medicare Other | Admitting: Physician Assistant

## 2022-01-26 VITALS — BP 144/72 | HR 70 | Temp 98.4°F | Resp 18 | Ht 64.0 in | Wt 229.7 lb

## 2022-01-26 DIAGNOSIS — Z801 Family history of malignant neoplasm of trachea, bronchus and lung: Secondary | ICD-10-CM | POA: Diagnosis not present

## 2022-01-26 DIAGNOSIS — D75839 Thrombocytosis, unspecified: Secondary | ICD-10-CM | POA: Diagnosis not present

## 2022-01-26 DIAGNOSIS — E559 Vitamin D deficiency, unspecified: Secondary | ICD-10-CM | POA: Diagnosis not present

## 2022-01-26 DIAGNOSIS — Z170 Estrogen receptor positive status [ER+]: Secondary | ICD-10-CM

## 2022-01-26 DIAGNOSIS — Z882 Allergy status to sulfonamides status: Secondary | ICD-10-CM | POA: Diagnosis not present

## 2022-01-26 DIAGNOSIS — G479 Sleep disorder, unspecified: Secondary | ICD-10-CM | POA: Diagnosis not present

## 2022-01-26 DIAGNOSIS — R0602 Shortness of breath: Secondary | ICD-10-CM | POA: Diagnosis not present

## 2022-01-26 DIAGNOSIS — Z8261 Family history of arthritis: Secondary | ICD-10-CM | POA: Diagnosis not present

## 2022-01-26 DIAGNOSIS — D473 Essential (hemorrhagic) thrombocythemia: Secondary | ICD-10-CM | POA: Diagnosis not present

## 2022-01-26 DIAGNOSIS — D45 Polycythemia vera: Secondary | ICD-10-CM | POA: Diagnosis not present

## 2022-01-26 DIAGNOSIS — Z79899 Other long term (current) drug therapy: Secondary | ICD-10-CM | POA: Diagnosis not present

## 2022-01-26 DIAGNOSIS — R519 Headache, unspecified: Secondary | ICD-10-CM | POA: Diagnosis not present

## 2022-01-26 DIAGNOSIS — C50412 Malignant neoplasm of upper-outer quadrant of left female breast: Secondary | ICD-10-CM

## 2022-01-26 DIAGNOSIS — R252 Cramp and spasm: Secondary | ICD-10-CM | POA: Diagnosis not present

## 2022-01-26 DIAGNOSIS — M79602 Pain in left arm: Secondary | ICD-10-CM | POA: Diagnosis not present

## 2022-01-26 DIAGNOSIS — R0609 Other forms of dyspnea: Secondary | ICD-10-CM | POA: Diagnosis not present

## 2022-01-26 DIAGNOSIS — Z8249 Family history of ischemic heart disease and other diseases of the circulatory system: Secondary | ICD-10-CM | POA: Diagnosis not present

## 2022-01-26 DIAGNOSIS — Z8673 Personal history of transient ischemic attack (TIA), and cerebral infarction without residual deficits: Secondary | ICD-10-CM | POA: Diagnosis not present

## 2022-01-26 DIAGNOSIS — R5383 Other fatigue: Secondary | ICD-10-CM | POA: Diagnosis not present

## 2022-01-26 DIAGNOSIS — R634 Abnormal weight loss: Secondary | ICD-10-CM | POA: Diagnosis not present

## 2022-01-26 NOTE — Patient Instructions (Signed)
[place] at [facility]  ?Discharge Instructions ? ?You were seen today by Tarri Abernethy PA-C for the following conditions. ? ?HISTORY OF LEFT BREAST CANCER: There were no major abnormalities on your exam today.  You will be due for another mammogram in July 2023.  We will repeat a breast exam at follow-up visit in about 6 months. ? ?ESSENTIAL THROMBOCYTOSIS: This refers to your elevated platelets, which is due to an underlying genetic mutation (JAK2).  This places you at an increased risk for blood clots (including heart attack and stroke) due to high platelet levels.  Therefore, we recommend that you continue to take hydroxyurea (Hydrea) at the same dose.  We will check your labs and see you back again in 3 months to follow-up on your blood work. ?** Prior to your upcoming tooth extraction you can STOP Hydrea 2 days prior to your procedure, and can RESUME Hydrea 1 to 2 weeks after your procedure, to allow for better wound healing.   ? ?LABS: Return in 3 months for repeat labs ? ?MEDICATIONS: ?- Continue hydroxyurea (Hydrea) 2 tablets (1000 mg) Monday/Wednesday/Friday and 1 tablet (500 mg) Tuesday/Thursday/Saturday/Sunday. ?- Continue vitamin D once per week. ? ?FOLLOW-UP APPOINTMENT: Office visit in 3 months, after labs ? ? ?Thank you for choosing [place] at [facility] to provide your oncology and hematology care.  To afford each patient quality time with our provider, please arrive at least 15 minutes before your scheduled appointment time.  ? ?If you have a lab appointment with the [facility] please come in thru the Main Entrance and check in at the main information desk. ? ?You need to re-schedule your appointment should you arrive 10 or more minutes late.  We strive to give you quality time with our providers, and arriving late affects you and other patients whose appointments are after yours.  Also, if you no show three or more times for appointments  you may be dismissed from the clinic at the providers discretion.     ?Again, thank you for choosing [facility].  Our hope is that these requests will decrease the amount of time that you wait before being seen by our physicians.       ?_____________________________________________________________ ? ?Should you have questions after your visit to [facility], please contact our office at [phone] and follow the prompts.  Our office hours are 8:00 a.m. and 4:30 p.m. Monday - Friday.  Please note that voicemails left after 4:00 p.m. may not be returned until the following business day.  We are closed weekends and major holidays.  You do have access to a nurse 24-7, just call the main number to the clinic [phone] and do not press any options, hold on the line and a nurse will answer the phone.   ? ?For prescription refill requests, have your pharmacy contact our office and allow 72 hours.   ? ?Due to Covid, you will need to wear a mask upon entering the hospital. If you do not have a mask, a mask will be given to you at the Main Entrance upon arrival. For doctor visits, patients may have 1 support person age 56 or older with them. For treatment visits, patients can not have anyone with them due to social distancing guidelines and our immunocompromised population.  ? ? ? ?

## 2022-01-26 NOTE — Progress Notes (Signed)
Patient is taking Hydrea and letrozole as prescribed.  She has not missed any doses and reports no side effects at this time.  ? ?

## 2022-01-29 ENCOUNTER — Other Ambulatory Visit (HOSPITAL_COMMUNITY): Payer: Self-pay | Admitting: Hematology

## 2022-03-17 ENCOUNTER — Other Ambulatory Visit (HOSPITAL_COMMUNITY): Payer: Self-pay | Admitting: Physician Assistant

## 2022-03-17 DIAGNOSIS — Z170 Estrogen receptor positive status [ER+]: Secondary | ICD-10-CM

## 2022-03-22 ENCOUNTER — Encounter (INDEPENDENT_AMBULATORY_CARE_PROVIDER_SITE_OTHER): Payer: Medicare Other | Admitting: Ophthalmology

## 2022-03-22 ENCOUNTER — Encounter (INDEPENDENT_AMBULATORY_CARE_PROVIDER_SITE_OTHER): Payer: Self-pay | Admitting: Ophthalmology

## 2022-03-22 ENCOUNTER — Ambulatory Visit (INDEPENDENT_AMBULATORY_CARE_PROVIDER_SITE_OTHER): Payer: Medicare Other | Admitting: Ophthalmology

## 2022-03-22 DIAGNOSIS — H353221 Exudative age-related macular degeneration, left eye, with active choroidal neovascularization: Secondary | ICD-10-CM

## 2022-03-22 DIAGNOSIS — H353212 Exudative age-related macular degeneration, right eye, with inactive choroidal neovascularization: Secondary | ICD-10-CM

## 2022-03-22 MED ORDER — BEVACIZUMAB 2.5 MG/0.1ML IZ SOSY
2.5000 mg | PREFILLED_SYRINGE | INTRAVITREAL | Status: AC | PRN
  Administered 2022-03-22: 2.5 mg via INTRAVITREAL

## 2022-03-22 NOTE — Assessment & Plan Note (Signed)
Stable by examination and by OCT today ?

## 2022-03-22 NOTE — Assessment & Plan Note (Signed)
Small region of disease activity superior to FAZ to currently 9-week follow-up.  Slightly more active with increased fluid.  Will need repeat injection Avastin today to maintain and reevaluate OS next in 7 weeks ?

## 2022-03-22 NOTE — Progress Notes (Signed)
? ? ?03/22/2022 ? ?  ? ?CHIEF COMPLAINT ?Patient presents for  ?Chief Complaint  ?Patient presents with  ? Macular Degeneration  ? ? ? ? ?HISTORY OF PRESENT ILLNESS: ?[name] is a 81 y.o. female who presents to the clinic today for:  ? ?HPI   ?9 weeks for DILATE OU, AVASTIN OCT, OS. ?Pt stated vision has been stable since last visit. ?Pt denies floaters and FOL, ? ? ?Last edited by Silvestre Moment on 03/22/2022  1:49 PM.  ?  ? ? ?Referring physician: ?[name], MD ?[address] ?[place],  [place] [address] ? ?HISTORICAL INFORMATION:  ? ?Selected notes from the [facility] ?  ?   ? ?CURRENT MEDICATIONS: ?No current outpatient medications on file. (Ophthalmic Drugs)  ? ?No current facility-administered medications for this visit. (Ophthalmic Drugs)  ? ?Current Outpatient Medications (Other)  ?Medication Sig  ? aspirin EC 81 MG tablet Take 81 mg by mouth at bedtime.   ? carvedilol (COREG) 12.5 MG tablet Take 12.5 mg by mouth 2 (two) times daily with a meal. Take 1/2 tablet by 2 times daily  ? cloNIDine (CATAPRES) 0.3 MG tablet Take 0.15 mg by mouth 2 (two) times daily.  ? diltiazem (CARDIZEM CD) 180 MG 24 hr capsule Take 180 mg by mouth daily.   ? doxylamine, Sleep, (SLEEP AID) 25 MG tablet Take 25 mg by mouth at bedtime as needed.  ? esomeprazole (NEXIUM) 40 MG capsule Take 1 capsule (40 mg total) by mouth every morning.  ? furosemide (LASIX) 80 MG tablet Take 1 tablet (80 mg total) by mouth daily.  ? hydroxyurea (HYDREA) 500 MG capsule Take 2 tablets on Monday, Wednesday and Friday and 1 tablet daily rest of the week.  May take with food to minimize GI side effects.  ? isosorbide mononitrate (IMDUR) 30 MG 24 hr tablet Take 15 mg by mouth daily.  ? letrozole (FEMARA) 2.5 MG tablet Take 1 tablet by mouth once daily  ? losartan (COZAAR) 100 MG tablet Take 100 mg by mouth every evening.   ? Multiple Vitamins-Minerals (PRESERVISION AREDS PO) Take by mouth.  ? potassium chloride (KLOR-CON) 10 MEQ tablet Take 1  tablet (10 mEq total) by mouth daily.  ? Simethicone (GAS RELIEF PO) Take 1 tablet by mouth daily.  ? simvastatin (ZOCOR) 10 MG tablet Take 10 mg by mouth every evening.   ? Vitamin D, Ergocalciferol, (DRISDOL) 1.25 MG (50000 UNIT) CAPS capsule Take 1 capsule by mouth once a week  ? ?No current facility-administered medications for this visit. (Other)  ? ? ? ? ?REVIEW OF SYSTEMS: ?ROS   ?Negative for: Constitutional, Gastrointestinal, Neurological, Skin, Genitourinary, Musculoskeletal, HENT, Endocrine, Cardiovascular, Eyes, Respiratory, Psychiatric, Allergic/Imm, Heme/Lymph ?Last edited by Silvestre Moment on 03/22/2022  1:49 PM.  ?  ? ? ? ?ALLERGIES ?Allergies  ?Allergen Reactions  ? Carbamazepine Hives and Other (See Comments)  ?  headache  ? Sulfa Antibiotics   ?  Rash  ? ? ?PAST MEDICAL HISTORY ?Past Medical History:  ?Diagnosis Date  ? Arthritis   ? Chronic diastolic heart failure ([place])   ? Essential hypertension   ? GERD (gastroesophageal reflux disease)   ? Gout   ? Hiatal hernia   ? History of endometrial cancer   ? Sp hysterectomy 2012  ? Ischemic colitis ([place]) 2016  ? Myeloproliferative disorder, JAK-2 positive   ? Obesity   ? Pneumonia   ? Polycythemia vera(238.4)   ? TIA (transient ischemic attack)   ? ?  Past Surgical History:  ?Procedure Laterality Date  ? ABDOMINAL HYSTERECTOMY  09/12/2011  ? Procedure: HYSTERECTOMY ABDOMINAL;  Surgeon: Janie Morning, MD;  [facility];  Service: Gynecology;  Laterality: N/A;  Total Abdominal Hysterectomy, Bilateral Salpingo Oophorectomy  ? BREAST LUMPECTOMY Left 07/10/2017  ? LEFT BREAST LUMPECTOMY WITH RADIOACTIVE SEED AND SENTINEL LYMPH NODE BIOPSY   ? BREAST LUMPECTOMY WITH RADIOACTIVE SEED AND SENTINEL LYMPH NODE BIOPSY Left 07/10/2017  ? Procedure: LEFT BREAST LUMPECTOMY WITH RADIOACTIVE SEED AND SENTINEL LYMPH NODE BIOPSY;  Surgeon: Rolm Bookbinder, MD;  Location: [facility];  Service: General;  Laterality: Left;  ? COLONOSCOPY  07/05/2012  ? Procedure: COLONOSCOPY;   Surgeon: Rogene Houston, MD;  [facility];  Service: Endoscopy;  Laterality: N/A;  730  ? EYE SURGERY    ? bilateral 8 - 12 yrs ago  ? FLEXIBLE SIGMOIDOSCOPY N/A 04/03/2015  ? Procedure: FLEXIBLE SIGMOIDOSCOPY;  Surgeon: Rogene Houston, MD;  [facility];  Service: Endoscopy;  Laterality: N/A;  ? HYSTEROSCOPY WITH D & C  08/15/2011  ? Procedure: DILATATION AND CURETTAGE (D&C) /HYSTEROSCOPY;  Surgeon: Jonnie Kind, MD;  [facility];  Service: Gynecology;  Laterality: N/A;  With Suction Curette  ? JOINT REPLACEMENT    ? left knee replaced 2014  ? KNEE ARTHROPLASTY Left 03/15/2015  ? Procedure: COMPUTER ASSISTED TOTAL KNEE ARTHROPLASTY;  Surgeon: Marybelle Killings, MD;  Location: [facility];  Service: Orthopedics;  Laterality: Left;  ? SALPINGOOPHORECTOMY  09/12/2011  ? Procedure: SALPINGO OOPHERECTOMY;  Surgeon: Janie Morning, MD;  [facility];  Service: Gynecology;  Laterality: Bilateral;  ? ? ?FAMILY HISTORY ?Family History  ?Problem Relation Age of Onset  ? Hypertension Mother   ? Lung cancer Father   ? Arthritis/Rheumatoid Sister   ? Anesthesia problems Neg Hx   ? Hypotension Neg Hx   ? Malignant hyperthermia Neg Hx   ? Pseudochol deficiency Neg Hx   ? ? ?SOCIAL HISTORY ?Social History  ? ?Tobacco Use  ? Smoking status: Never  ? Smokeless tobacco: Never  ?Vaping Use  ? Vaping Use: Never used  ?Substance Use Topics  ? Alcohol use: No  ? Drug use: No  ? ?  ? ?  ? ?OPHTHALMIC EXAM: ? ?Base Eye Exam   ? ? Visual Acuity (ETDRS)   ? ?   Right Left  ? Dist [place] 20/20 -2 20/40 -1  ? Dist ph [place]  NI  ? ?  ?  ? ? Tonometry (Tonopen, 1:53 PM)   ? ?   Right Left  ? Pressure 10 9  ? ?  ?  ? ? Pupils   ? ?   Pupils APD  ? Right PERRL None  ? Left PERRL None  ? ?  ?  ? ? Visual Fields   ? ?   Left Right  ?  Full Full  ? ?  ?  ? ? Extraocular Movement   ? ?   Right Left  ?  Full Full  ? ?  ?  ? ? Neuro/Psych   ? ? Oriented x3: Yes  ? Mood/Affect: Normal  ? ?  ?  ? ? Dilation   ? ? Both eyes: 1.0% Mydriacyl,  2.5% Phenylephrine @ 1:53 PM  ? ?  ?  ? ?  ? ?Slit Lamp and Fundus Exam   ? ? External Exam   ? ?   Right Left  ? External Normal Normal  ? ?  ?  ? ?  Slit Lamp Exam   ? ?   Right Left  ? Lids/Lashes Normal Normal  ? Conjunctiva/Sclera White and quiet White and quiet  ? Cornea Clear Clear  ? Anterior Chamber Deep and quiet Deep and quiet  ? Iris Round and reactive Round and reactive  ? Lens Posterior chamber intraocular lens Posterior chamber intraocular lens  ? Anterior Vitreous Normal Normal  ? ?  ?  ? ? Fundus Exam   ? ?   Right Left  ? Posterior Vitreous Posterior vitreous detachment Posterior vitreous detachment  ? Disc Normal.  Normal  ? C/D Ratio 0.15 0.25  ? Macula Hard drusen, no hemorrhage, no macular thickening no macular thickening clinically, , Retinal pigment epithelial mottling, Hard drusen, no exudates, Pigmented atrophy  ? Vessels Normal Normal  ? Periphery Subretinal hemorrhage, along the superotemporal arcade Normal  ? ?  ?  ? ?  ? ? ?IMAGING AND PROCEDURES  ?Imaging and Procedures for 03/22/22 ? ?OCT, Retina - OU - Both Eyes   ? ?   ?Right Eye ?Quality was good. Scan locations included subfoveal. Central Foveal Thickness: 259. Progression has been stable. Findings include no SRF, no IRF.  ? ?Left Eye ?Quality was good. Scan locations included subfoveal. Central Foveal Thickness: 270. Progression has been stable. Findings include no IRF, subretinal fluid, abnormal foveal contour.  ? ?Notes ?OS today at 9 weeks post injection we planned for repeat injection today, residual subretinal fluid today at this longer interval.  Increased today.  Repeat injection today and maintain interval examination OS next 7 weeks ? ? ?OD today now  12 post last injection stable no sign of CNVM ? ?  ? ?Intravitreal Injection, Pharmacologic Agent - OS - Left Eye   ? ?   ?Time Out ?03/22/2022. 2:18 PM. Confirmed correct patient, procedure, site, and patient consented.  ? ?Anesthesia ?Topical anesthesia was used. Anesthetic  medications included Lidocaine 4%.  ? ?Procedure ?Preparation included Tobramycin 0.3%, 10% betadine to eyelids, 5% betadine to ocular surface. A 30 gauge needle was used.  ? ?Injection: ?2.5 mg bevacizuma

## 2022-04-06 ENCOUNTER — Ambulatory Visit: Payer: Medicare Other | Admitting: Cardiology

## 2022-04-06 ENCOUNTER — Encounter: Payer: Self-pay | Admitting: Cardiology

## 2022-04-06 VITALS — BP 164/82 | HR 72 | Ht 64.0 in | Wt 232.4 lb

## 2022-04-06 DIAGNOSIS — I350 Nonrheumatic aortic (valve) stenosis: Secondary | ICD-10-CM

## 2022-04-06 DIAGNOSIS — I342 Nonrheumatic mitral (valve) stenosis: Secondary | ICD-10-CM

## 2022-04-06 DIAGNOSIS — I259 Chronic ischemic heart disease, unspecified: Secondary | ICD-10-CM | POA: Diagnosis not present

## 2022-04-06 DIAGNOSIS — I272 Pulmonary hypertension, unspecified: Secondary | ICD-10-CM

## 2022-04-06 MED ORDER — FUROSEMIDE 40 MG PO TABS: 60.0000 mg | ORAL_TABLET | Freq: Every day | ORAL | 3 refills | Status: DC

## 2022-04-06 NOTE — Patient Instructions (Signed)
Medication Instructions:  Your physician has recommended you make the following change in your medication:  Increase furosemide to 60 mg daily Continue other medications the same  Labwork: none  Testing/Procedures: none  Follow-Up: Your physician recommends that you schedule a follow-up appointment in: 3 months  Any Other Special Instructions Will Be Listed Below (If Applicable). You have been referred to [facility] Pulmonology  If you need a refill on your cardiac medications before your next appointment, please call your pharmacy.

## 2022-04-06 NOTE — Progress Notes (Signed)
Cardiology Office Note  Date: 04/06/2022   ID: Lori, Stafford 06/22/1941, MRN [id number]  PCP:  [name], MD  Cardiologist:  Rozann Lesches, MD Electrophysiologist:  None   Chief Complaint  Patient presents with   Cardiac follow-up    History of Present Illness: [name] is an 81 y.o. female last seen in March.  She is here with her daughter for a follow-up visit.  She reports NYHA class III dyspnea and ambulatory hypoxia.  She does have home oxygen which was arranged by Dr. Willey Blade initially, she previously saw Dr. Luan Pulling.  She uses this only as needed, but it sounds like her hypoxia is more frequent.  She is being managed for HFpEF with LVEF 65 to 70%.  She does have associated severe pulmonary hypertension with RVSP 67 mmHg.  Valvular heart disease includes mild to moderate mitral stenosis and mild aortic stenosis.  Suspect some component of diastolic dysfunction (WHO group 2), but she has not undergone a full pulmonary evaluation.  I reviewed her interval lab work.  She did cut Lasix back from 80 mg daily to 40 mg daily due to nocturia.  We discussed trying to increase Lasix to 60 mg daily for now.  Also plan to make referral to [facility].  Past Medical History:  Diagnosis Date   Arthritis    Chronic diastolic heart failure ([place])    Essential hypertension    GERD (gastroesophageal reflux disease)    Gout    Hiatal hernia    History of endometrial cancer    Sp hysterectomy 2012   Ischemic colitis ([place]) 2016   Myeloproliferative disorder, JAK-2 positive    Obesity    Pneumonia    Polycythemia vera(238.4)    TIA (transient ischemic attack)     Past Surgical History:  Procedure Laterality Date   ABDOMINAL HYSTERECTOMY  09/12/2011   Procedure: HYSTERECTOMY ABDOMINAL;  Surgeon: Janie Morning, MD;  [facility];  Service: Gynecology;  Laterality: N/A;  Total Abdominal Hysterectomy, Bilateral Salpingo Oophorectomy   BREAST LUMPECTOMY Left  07/10/2017   LEFT BREAST LUMPECTOMY WITH RADIOACTIVE SEED AND SENTINEL LYMPH NODE BIOPSY    BREAST LUMPECTOMY WITH RADIOACTIVE SEED AND SENTINEL LYMPH NODE BIOPSY Left 07/10/2017   Procedure: LEFT BREAST LUMPECTOMY WITH RADIOACTIVE SEED AND SENTINEL LYMPH NODE BIOPSY;  Surgeon: Rolm Bookbinder, MD;  Location: [facility];  Service: General;  Laterality: Left;   COLONOSCOPY  07/05/2012   Procedure: COLONOSCOPY;  Surgeon: Rogene Houston, MD;  [facility];  Service: Endoscopy;  Laterality: N/A;  730   EYE SURGERY     bilateral 8 - 12 yrs ago   FLEXIBLE SIGMOIDOSCOPY N/A 04/03/2015   Procedure: FLEXIBLE SIGMOIDOSCOPY;  Surgeon: Rogene Houston, MD;  [facility];  Service: Endoscopy;  Laterality: N/A;   HYSTEROSCOPY WITH D & C  08/15/2011   Procedure: DILATATION AND CURETTAGE (D&C) /HYSTEROSCOPY;  Surgeon: Jonnie Kind, MD;  [facility];  Service: Gynecology;  Laterality: N/A;  With Suction Curette   JOINT REPLACEMENT     left knee replaced 2014   KNEE ARTHROPLASTY Left 03/15/2015   Procedure: COMPUTER ASSISTED TOTAL KNEE ARTHROPLASTY;  Surgeon: Marybelle Killings, MD;  Location: [facility];  Service: Orthopedics;  Laterality: Left;   SALPINGOOPHORECTOMY  09/12/2011   Procedure: SALPINGO OOPHERECTOMY;  Surgeon: Janie Morning, MD;  [facility];  Service: Gynecology;  Laterality: Bilateral;    Current Outpatient Medications  Medication Sig Dispense Refill  albuterol (VENTOLIN HFA) 108 (90 Base) MCG/ACT inhaler Inhale 2 puffs into the lungs every 6 (six) hours as needed for wheezing or shortness of breath.     aspirin EC 81 MG tablet Take 81 mg by mouth at bedtime.      carvedilol (COREG) 12.5 MG tablet Take 12.5 mg by mouth 2 (two) times daily with a meal. Take 1/2 tablet by 2 times daily     cloNIDine (CATAPRES) 0.3 MG tablet Take 0.15 mg by mouth 2 (two) times daily.     diltiazem (CARDIZEM CD) 180 MG 24 hr capsule Take 180 mg by mouth daily.      doxylamine, Sleep, (SLEEP  AID) 25 MG tablet Take 25 mg by mouth at bedtime as needed.     esomeprazole (NEXIUM) 40 MG capsule Take 1 capsule (40 mg total) by mouth every morning. 30 capsule 11   furosemide (LASIX) 40 MG tablet Take 1.5 tablets (60 mg total) by mouth daily. 135 tablet 3   hydroxyurea (HYDREA) 500 MG capsule Take 2 tablets on Monday, Wednesday and Friday and 1 tablet daily rest of the week.  May take with food to minimize GI side effects. 90 capsule 3   isosorbide mononitrate (IMDUR) 30 MG 24 hr tablet Take 15 mg by mouth daily.     letrozole (FEMARA) 2.5 MG tablet Take 1 tablet by mouth once daily 90 tablet 0   losartan (COZAAR) 100 MG tablet Take 100 mg by mouth every evening.      Multiple Vitamins-Minerals (PRESERVISION AREDS PO) Take by mouth.     potassium chloride (KLOR-CON) 10 MEQ tablet Take 1 tablet (10 mEq total) by mouth daily. 90 tablet 3   Simethicone (GAS RELIEF PO) Take 1 tablet by mouth daily.     simvastatin (ZOCOR) 10 MG tablet Take 10 mg by mouth every evening.      Vitamin D, Ergocalciferol, (DRISDOL) 1.25 MG (50000 UNIT) CAPS capsule Take 1 capsule by mouth once a week 12 capsule 0   No current facility-administered medications for this visit.   Allergies:  Carbamazepine and Sulfa antibiotics   ROS: No palpitations or syncope.  Physical Exam: VS:  BP (!) 164/82   Pulse 72   Ht '5\' 4"'$  (1.626 m)   Wt 232 lb 6.4 oz (105.4 kg)   SpO2 90%   BMI 39.89 kg/m , BMI Body mass index is 39.89 kg/m.  Wt Readings from Last 3 Encounters:  04/06/22 232 lb 6.4 oz (105.4 kg)  01/26/22 229 lb 11.5 oz (104.2 kg)  01/10/22 246 lb (111.6 kg)    General: Patient appears comfortable at rest. HEENT: Conjunctiva and lids normal. Neck: Supple, no elevated JVP or carotid bruits, no thyromegaly. Lungs: Clear to auscultation, nonlabored breathing at rest. Cardiac: Regular rate and rhythm, no S3, 6-2/3 systolic murmur, no pericardial rub. Abdomen: Soft, bowel sounds present. Extremities: Chronic  appearing leg edema.  ECG:  An ECG dated 04/25/2021 was personally reviewed today and demonstrated:  Sinus rhythm with right bundle branch block.  Recent Labwork: 04/25/2021: B Natriuretic Peptide 57.0 01/16/2022: ALT 12; AST 15; BUN 21; Creatinine, Ser 0.63; Hemoglobin 14.3; Platelets 448; Potassium 4.3; Sodium 142   Other Studies Reviewed Today:  Lexiscan Myoview 08/16/2021: Findings are consistent with ischemia. The study is intermediate risk given TID of 1.21 and inferolateral partially reversible defect.   The study is intermediate risk.   No ST deviation was noted.   LV perfusion is abnormal. There is evidence of ischemia in  the inferolateral/apical region. There is no evidence of infarction.   Defect 1: There is a medium defect with moderate reduction in uptake present in the apical to basal inferolateral location(s) that is partially reversible. There is normal wall motion in the defect area. Consistent with ischemia.   Left ventricular function is normal. End diastolic cavity size is normal. Evidence of transient ischemic dilation (TID) noted. TID was appreciated quantitatively but not visually.   Echocardiogram 09/13/2021:  1. Left ventricular ejection fraction, by estimation, is 65 to 70%. The  left ventricle has normal function. The left ventricle has no regional  wall motion abnormalities. There is moderate left ventricular hypertrophy.  Left ventricular diastolic  parameters are indeterminate.   2. Right ventricular systolic function is normal. The right ventricular  size is normal. There is severely elevated pulmonary artery systolic  pressure.   3. Left atrial size was severely dilated.   4. The mitral valve is abnormal. No evidence of mitral valve  regurgitation. Mild to moderate mitral stenosis. Moderate mitral annular  calcification.   5. The tricuspid valve is abnormal.   6. The aortic valve is tricuspid. There is moderate calcification of the  aortic valve. There is  moderate thickening of the aortic valve. Aortic  valve regurgitation is not visualized. Mild aortic valve stenosis. Aortic  valve mean gradient measures 10.0  mmHg. Aortic valve peak gradient measures 22.7 mmHg. Aortic valve area, by  VTI measures 2.11 cm   7. The inferior vena cava is dilated in size with >50% respiratory  variability, suggesting right atrial pressure of 8 mmHg.   Assessment and Plan:  1.  Chronic hypoxic respiratory failure with severe pulmonary hypertension.  Suspect component of WHO group 2, but she has not had a full pulmonary evaluation.  Has supplemental oxygen that she uses only occasionally at home.  Plan to get her in for evaluation with [facility].  Depending on that work-up, can consider whether she would need any referral for a right heart catheterization which would be done through the heart failure clinic in [place].  I encouraged her to use her oxygen more consistently, we will increase Lasix to 60 mg daily for now as well.  2.  Valvular heart disease including mild to moderate mitral stenosis and mild aortic stenosis.  She has an impressive systolic murmur on examination.  3.  Ischemic heart disease based on Myoview with plan for conservative management at this time in the absence of angina symptoms.  Continue aspirin, Coreg, Cozaar, Imdur and Zocor.  Medication Adjustments/Labs and Tests Ordered: Current medicines are reviewed at length with the patient today.  Concerns regarding medicines are outlined above.   Tests Ordered: Orders Placed This Encounter  Procedures   Ambulatory referral to Pulmonology    Medication Changes: Meds ordered this encounter  Medications   furosemide (LASIX) 40 MG tablet    Sig: Take 1.5 tablets (60 mg total) by mouth daily.    Dispense:  135 tablet    Refill:  3    04/06/2022 dose change    Disposition:  Follow up  3 months.  Signed, Satira Sark, MD, [facility] 04/06/2022 2:04 PM    [facility] [place], [place],  [address] Phone: [phone]; Fax: [phone]

## 2022-04-11 ENCOUNTER — Telehealth (HOSPITAL_COMMUNITY): Payer: Self-pay | Admitting: *Deleted

## 2022-04-11 NOTE — Telephone Encounter (Signed)
Patient called to advise that she has been unable to restart Hydrea post oral surgery due to bleeding and swelling.  Has an appointment to return to oral surgeon in 2 weeks to have stitches removed.  Advised to continue to stay off of Hydrea until these issues are resolved. Verbalized understanding.

## 2022-04-11 NOTE — Telephone Encounter (Signed)
Noted.  I agree with the above.

## 2022-04-22 ENCOUNTER — Other Ambulatory Visit (HOSPITAL_COMMUNITY): Payer: Self-pay | Admitting: Hematology

## 2022-04-24 ENCOUNTER — Other Ambulatory Visit (HOSPITAL_COMMUNITY): Payer: Self-pay | Admitting: *Deleted

## 2022-04-24 DIAGNOSIS — D473 Essential (hemorrhagic) thrombocythemia: Secondary | ICD-10-CM

## 2022-04-24 MED ORDER — HYDROXYUREA 500 MG PO CAPS: ORAL_CAPSULE | ORAL | 3 refills | Status: DC

## 2022-04-24 NOTE — Telephone Encounter (Signed)
Femara refill request approved.  Per last ovn, patient is tolerating and is to continue on therapy.

## 2022-04-24 NOTE — Telephone Encounter (Signed)
Hydrea refill approved.  Per last ovn, patient tolerating and is to continue therapy.

## 2022-04-27 ENCOUNTER — Other Ambulatory Visit (HOSPITAL_COMMUNITY)
Admission: RE | Admit: 2022-04-27 | Discharge: 2022-04-27 | Disposition: A | Discharge status: Home / Self Care | Payer: Medicare Other | Attending: Internal Medicine | Admitting: Internal Medicine

## 2022-04-27 ENCOUNTER — Inpatient Hospital Stay (HOSPITAL_COMMUNITY): Payer: Medicare Other | Attending: Hematology

## 2022-04-27 DIAGNOSIS — E559 Vitamin D deficiency, unspecified: Secondary | ICD-10-CM | POA: Insufficient documentation

## 2022-04-27 DIAGNOSIS — Z79899 Other long term (current) drug therapy: Secondary | ICD-10-CM | POA: Diagnosis not present

## 2022-04-27 DIAGNOSIS — D473 Essential (hemorrhagic) thrombocythemia: Secondary | ICD-10-CM | POA: Insufficient documentation

## 2022-04-27 DIAGNOSIS — Z170 Estrogen receptor positive status [ER+]: Secondary | ICD-10-CM | POA: Insufficient documentation

## 2022-04-27 DIAGNOSIS — C50412 Malignant neoplasm of upper-outer quadrant of left female breast: Secondary | ICD-10-CM | POA: Diagnosis not present

## 2022-04-27 LAB — CBC WITH DIFFERENTIAL/PLATELET
Abs Immature Granulocytes: 0.02 10*3/uL (ref 0.00–0.07)
Basophils Absolute: 0.1 10*3/uL (ref 0.0–0.1)
Basophils Relative: 2 %
Eosinophils Absolute: 0.4 10*3/uL (ref 0.0–0.5)
Eosinophils Relative: 5 %
HCT: 49.2 % — ABNORMAL HIGH (ref 36.0–46.0)
Hemoglobin: 15.9 g/dL — ABNORMAL HIGH (ref 12.0–15.0)
Immature Granulocytes: 0 %
Lymphocytes Relative: 20 %
Lymphs Abs: 1.6 10*3/uL (ref 0.7–4.0)
MCH: 33 pg (ref 26.0–34.0)
MCHC: 32.3 g/dL (ref 30.0–36.0)
MCV: 102.1 fL — ABNORMAL HIGH (ref 80.0–100.0)
Monocytes Absolute: 0.6 10*3/uL (ref 0.1–1.0)
Monocytes Relative: 7 %
Neutro Abs: 5.3 10*3/uL (ref 1.7–7.7)
Neutrophils Relative %: 66 %
Platelets: 520 10*3/uL — ABNORMAL HIGH (ref 150–400)
RBC: 4.82 MIL/uL (ref 3.87–5.11)
RDW: 12.3 % (ref 11.5–15.5)
WBC: 8.1 10*3/uL (ref 4.0–10.5)
nRBC: 0 % (ref 0.0–0.2)

## 2022-04-27 LAB — COMPREHENSIVE METABOLIC PANEL
ALT: 12 U/L (ref 0–44)
AST: 19 U/L (ref 15–41)
Albumin: 3.9 g/dL (ref 3.5–5.0)
Alkaline Phosphatase: 97 U/L (ref 38–126)
Anion gap: 6 (ref 5–15)
BUN: 16 mg/dL (ref 8–23)
CO2: 29 mmol/L (ref 22–32)
Calcium: 9.4 mg/dL (ref 8.9–10.3)
Chloride: 102 mmol/L (ref 98–111)
Creatinine, Ser: 0.54 mg/dL (ref 0.44–1.00)
GFR, Estimated: >60 mL/min (ref >60)
Glucose, Bld: 98 mg/dL (ref 70–99)
Potassium: 4.2 mmol/L (ref 3.5–5.1)
Sodium: 137 mmol/L (ref 135–145)
Total Bilirubin: 0.6 mg/dL (ref 0.3–1.2)
Total Protein: 8 g/dL (ref 6.5–8.1)

## 2022-04-27 LAB — VITAMIN D 25 HYDROXY (VIT D DEFICIENCY, FRACTURES): Vit D, 25-Hydroxy: 81.85 ng/mL (ref 30–100)

## 2022-04-27 LAB — LACTATE DEHYDROGENASE: LDH: 149 U/L (ref 98–192)

## 2022-05-04 ENCOUNTER — Ambulatory Visit (HOSPITAL_COMMUNITY): Payer: Medicare Other | Admitting: Physician Assistant

## 2022-05-04 DIAGNOSIS — I5030 Unspecified diastolic (congestive) heart failure: Secondary | ICD-10-CM | POA: Diagnosis not present

## 2022-05-04 DIAGNOSIS — D473 Essential (hemorrhagic) thrombocythemia: Secondary | ICD-10-CM | POA: Diagnosis not present

## 2022-05-08 ENCOUNTER — Ambulatory Visit (HOSPITAL_COMMUNITY): Payer: Medicare Other | Admitting: Physician Assistant

## 2022-05-11 ENCOUNTER — Ambulatory Visit (INDEPENDENT_AMBULATORY_CARE_PROVIDER_SITE_OTHER): Payer: Medicare Other | Admitting: Ophthalmology

## 2022-05-11 ENCOUNTER — Encounter (INDEPENDENT_AMBULATORY_CARE_PROVIDER_SITE_OTHER): Payer: Self-pay | Admitting: Ophthalmology

## 2022-05-11 DIAGNOSIS — H353221 Exudative age-related macular degeneration, left eye, with active choroidal neovascularization: Secondary | ICD-10-CM | POA: Diagnosis not present

## 2022-05-11 DIAGNOSIS — H353212 Exudative age-related macular degeneration, right eye, with inactive choroidal neovascularization: Secondary | ICD-10-CM

## 2022-05-11 DIAGNOSIS — H35722 Serous detachment of retinal pigment epithelium, left eye: Secondary | ICD-10-CM | POA: Diagnosis not present

## 2022-05-11 MED ORDER — BEVACIZUMAB 2.5 MG/0.1ML IZ SOSY
2.5000 mg | PREFILLED_SYRINGE | INTRAVITREAL | Status: AC | PRN
  Administered 2022-05-11: 2.5 mg via INTRAVITREAL

## 2022-05-11 NOTE — Progress Notes (Signed)
05/11/2022     CHIEF COMPLAINT Patient presents for  Chief Complaint  Patient presents with   Macular Degeneration      HISTORY OF PRESENT ILLNESS: [name] is a 81 y.o. female who presents to the clinic today for:   HPI   7 weeks for DILATE, OS, AVASTIN OCT.  Pt stated vision has been stable since last visit.  Last edited by Silvestre Moment on 05/11/2022  2:52 PM.      Referring physician: [name], MD [address] [place],  [place] [address]  HISTORICAL INFORMATION:   Selected notes from the MEDICAL RECORD NUMBER       CURRENT MEDICATIONS: No current outpatient medications on file. (Ophthalmic Drugs)   No current facility-administered medications for this visit. (Ophthalmic Drugs)   Current Outpatient Medications (Other)  Medication Sig   albuterol (VENTOLIN HFA) 108 (90 Base) MCG/ACT inhaler Inhale 2 puffs into the lungs every 6 (six) hours as needed for wheezing or shortness of breath.   aspirin EC 81 MG tablet Take 81 mg by mouth at bedtime.    carvedilol (COREG) 12.5 MG tablet Take 12.5 mg by mouth 2 (two) times daily with a meal. Take 1/2 tablet by 2 times daily   cloNIDine (CATAPRES) 0.3 MG tablet Take 0.15 mg by mouth 2 (two) times daily.   diltiazem (CARDIZEM CD) 180 MG 24 hr capsule Take 180 mg by mouth daily.    doxylamine, Sleep, (SLEEP AID) 25 MG tablet Take 25 mg by mouth at bedtime as needed.   esomeprazole (NEXIUM) 40 MG capsule Take 1 capsule (40 mg total) by mouth every morning.   furosemide (LASIX) 40 MG tablet Take 1.5 tablets (60 mg total) by mouth daily.   hydroxyurea (HYDREA) 500 MG capsule Take 2 tablets on Monday, Wednesday and Friday and 1 tablet daily rest of the week.  May take with food to minimize GI side effects.   isosorbide mononitrate (IMDUR) 30 MG 24 hr tablet Take 15 mg by mouth daily.   letrozole (FEMARA) 2.5 MG tablet Take 1 tablet by mouth once daily   losartan (COZAAR) 100 MG tablet Take 100 mg by mouth every evening.     Multiple Vitamins-Minerals (PRESERVISION AREDS PO) Take by mouth.   potassium chloride (KLOR-CON) 10 MEQ tablet Take 1 tablet (10 mEq total) by mouth daily.   Simethicone (GAS RELIEF PO) Take 1 tablet by mouth daily.   simvastatin (ZOCOR) 10 MG tablet Take 10 mg by mouth every evening.    Vitamin D, Ergocalciferol, (DRISDOL) 1.25 MG (50000 UNIT) CAPS capsule Take 1 capsule by mouth once a week   No current facility-administered medications for this visit. (Other)      REVIEW OF SYSTEMS: ROS   Negative for: Constitutional, Gastrointestinal, Neurological, Skin, Genitourinary, Musculoskeletal, HENT, Endocrine, Cardiovascular, Eyes, Respiratory, Psychiatric, Allergic/Imm, Heme/Lymph Last edited by Silvestre Moment on 05/11/2022  2:52 PM.       ALLERGIES Allergies  Allergen Reactions   Carbamazepine Hives and Other (See Comments)    headache   Sulfa Antibiotics     Rash    PAST MEDICAL HISTORY Past Medical History:  Diagnosis Date   Arthritis    Chronic diastolic heart failure ([place])    Essential hypertension    GERD (gastroesophageal reflux disease)    Gout    Hiatal hernia    History of endometrial cancer    Sp hysterectomy 2012   Ischemic colitis ([place]) 2016   Myeloproliferative disorder, JAK-2 positive  Obesity    Pneumonia    Polycythemia vera(238.4)    TIA (transient ischemic attack)    Past Surgical History:  Procedure Laterality Date   ABDOMINAL HYSTERECTOMY  09/12/2011   Procedure: HYSTERECTOMY ABDOMINAL;  Surgeon: Janie Morning, MD;  [facility];  Service: Gynecology;  Laterality: N/A;  Total Abdominal Hysterectomy, Bilateral Salpingo Oophorectomy   BREAST LUMPECTOMY Left 07/10/2017   LEFT BREAST LUMPECTOMY WITH RADIOACTIVE SEED AND SENTINEL LYMPH NODE BIOPSY    BREAST LUMPECTOMY WITH RADIOACTIVE SEED AND SENTINEL LYMPH NODE BIOPSY Left 07/10/2017   Procedure: LEFT BREAST LUMPECTOMY WITH RADIOACTIVE SEED AND SENTINEL LYMPH NODE BIOPSY;  Surgeon: Rolm Bookbinder, MD;  Location: [facility];  Service: General;  Laterality: Left;   COLONOSCOPY  07/05/2012   Procedure: COLONOSCOPY;  Surgeon: Rogene Houston, MD;  [facility];  Service: Endoscopy;  Laterality: N/A;  730   EYE SURGERY     bilateral 8 - 12 yrs ago   FLEXIBLE SIGMOIDOSCOPY N/A 04/03/2015   Procedure: FLEXIBLE SIGMOIDOSCOPY;  Surgeon: Rogene Houston, MD;  [facility];  Service: Endoscopy;  Laterality: N/A;   HYSTEROSCOPY WITH D & C  08/15/2011   Procedure: DILATATION AND CURETTAGE (D&C) /HYSTEROSCOPY;  Surgeon: Jonnie Kind, MD;  [facility];  Service: Gynecology;  Laterality: N/A;  With Suction Curette   JOINT REPLACEMENT     left knee replaced 2014   KNEE ARTHROPLASTY Left 03/15/2015   Procedure: COMPUTER ASSISTED TOTAL KNEE ARTHROPLASTY;  Surgeon: Marybelle Killings, MD;  Location: [facility];  Service: Orthopedics;  Laterality: Left;   SALPINGOOPHORECTOMY  09/12/2011   Procedure: SALPINGO OOPHERECTOMY;  Surgeon: Janie Morning, MD;  [facility];  Service: Gynecology;  Laterality: Bilateral;    FAMILY HISTORY Family History  Problem Relation Age of Onset   Hypertension Mother    Lung cancer Father    Arthritis/Rheumatoid Sister    Anesthesia problems Neg Hx    Hypotension Neg Hx    Malignant hyperthermia Neg Hx    Pseudochol deficiency Neg Hx     SOCIAL HISTORY Social History   Tobacco Use   Smoking status: Never   Smokeless tobacco: Never  Vaping Use   Vaping Use: Never used  Substance Use Topics   Alcohol use: No   Drug use: No         OPHTHALMIC EXAM:  Base Eye Exam     Visual Acuity (ETDRS)       Right Left   Dist [place] 20/40 20/30 -2   Dist ph [place] 20/25 -2 NI         Tonometry (Tonopen, 2:56 PM)       Right Left   Pressure 16 16         Pupils       Pupils APD   Right PERRL None   Left PERRL None         Visual Fields       Left Right    Full Full         Extraocular Movement       Right Left    Full  Full         Neuro/Psych     Oriented x3: Yes   Mood/Affect: Normal         Dilation     Left eye: 2.5% Phenylephrine, 1.0% Mydriacyl @ 2:56 PM           Slit Lamp and Fundus Exam  External Exam       Right Left   External Normal Normal         Slit Lamp Exam       Right Left   Lids/Lashes Normal Normal   Conjunctiva/Sclera White and quiet White and quiet   Cornea Clear Clear   Anterior Chamber Deep and quiet Deep and quiet   Iris Round and reactive Round and reactive   Lens Posterior chamber intraocular lens Posterior chamber intraocular lens   Anterior Vitreous Normal Normal         Fundus Exam       Right Left   Posterior Vitreous Posterior vitreous detachment Posterior vitreous detachment   Disc Normal.  Normal   C/D Ratio 0.15 0.25   Macula Hard drusen, no hemorrhage, no macular thickening no macular thickening clinically, , Retinal pigment epithelial mottling, Hard drusen, no exudates, Pigmented atrophy   Vessels Normal Normal   Periphery Subretinal hemorrhage, along the superotemporal arcade Normal            IMAGING AND PROCEDURES  Imaging and Procedures for 05/11/22  OCT, Retina - OU - Both Eyes       Right Eye Quality was good. Scan locations included subfoveal. Central Foveal Thickness: 259. Progression has been stable. Findings include no IRF, no SRF.   Left Eye Quality was good. Scan locations included subfoveal. Central Foveal Thickness: 270. Progression has been stable. Findings include no IRF, abnormal foveal contour, subretinal fluid.   Notes OS today at  weeks post injection we planned for repeat injection today,  improved residual subretinal fluid today at this interval.  Repeat injection today and maintain interval examination OS next 7 weeks   OD today now over 1 year post last injection stable no sign of CNVM     Intravitreal Injection, Pharmacologic Agent - OS - Left Eye       Time Out 05/11/2022. 4:10 PM.  Confirmed correct patient, procedure, site, and patient consented.   Anesthesia Topical anesthesia was used. Anesthetic medications included Lidocaine 4%.   Procedure Preparation included 5% betadine to ocular surface, 10% betadine to eyelids, Tobramycin 0.3%. A 30 gauge needle was used.   Injection: 2.5 mg bevacizumab 2.5 MG/0.1ML   Route: Intravitreal, Site: Left Eye   NDC: [phone], Lot: [id number], Expiration date: 07/14/2022   Post-op Post injection exam found visual acuity of at least counting fingers. The patient tolerated the procedure well. There were no complications. The patient received written and verbal post procedure care education. Post injection medications included ocuflox.              ASSESSMENT/PLAN:  Exudative age-related macular degeneration of left eye with active choroidal neovascularization (HCC) OS today much improved at shorter interval follow-up with less subretinal fluid and preserved acuity, post Avastin at 7 weeks.  Repeat injection today reevaluate again in 7 weeks  Exudative age-related macular degeneration of right eye with inactive choroidal neovascularization ([place]) No signs of recurrence  Serous detachment of retinal pigment epithelium of left eye Component of wet AMD OS     ICD-10-CM   1. Exudative age-related macular degeneration of left eye with active choroidal neovascularization (HCC)  H35.3221 OCT, Retina - OU - Both Eyes    Intravitreal Injection, Pharmacologic Agent - OS - Left Eye    bevacizumab (AVASTIN) SOSY 2.5 mg    2. Exudative age-related macular degeneration of right eye with inactive choroidal neovascularization ([place])  H35.3212     3. Serous detachment of  retinal pigment epithelium of left eye  H35.722       1.  Repeat injection OS today of intravitreal Avastin at 7-week interval to maintain good acuity and functioning.  2.  3.  Ophthalmic Meds Ordered this visit:  Meds ordered this encounter  Medications    bevacizumab (AVASTIN) SOSY 2.5 mg       No follow-ups on file.  There are no Patient Instructions on file for this visit.   Explained the diagnoses, plan, and follow up with the patient and they expressed understanding.  Patient expressed understanding of the importance of proper follow up care.   Clent Demark Christion Leonhard M.D. Diseases & Surgery of the Retina and Vitreous Retina [facility] Diabetic [place] 05/11/22     Abbreviations: M myopia (nearsighted); A astigmatism; H hyperopia (farsighted); P presbyopia; Mrx spectacle prescription;  CTL contact lenses; OD right eye; OS left eye; OU both eyes  XT exotropia; ET esotropia; PEK punctate epithelial keratitis; PEE punctate epithelial erosions; DES dry eye syndrome; MGD meibomian gland dysfunction; ATs artificial tears; PFAT's preservative free artificial tears; [place] nuclear sclerotic cataract; PSC posterior subcapsular cataract; ERM epi-retinal membrane; PVD posterior vitreous detachment; RD retinal detachment; DM diabetes mellitus; DR diabetic retinopathy; NPDR non-proliferative diabetic retinopathy; PDR proliferative diabetic retinopathy; CSME clinically significant macular edema; DME diabetic macular edema; dbh dot blot hemorrhages; CWS cotton wool spot; POAG primary open angle glaucoma; C/D cup-to-disc ratio; HVF humphrey visual field; GVF goldmann visual field; OCT optical coherence tomography; IOP intraocular pressure; BRVO Branch retinal vein occlusion; CRVO central retinal vein occlusion; CRAO central retinal artery occlusion; BRAO branch retinal artery occlusion; RT retinal tear; SB scleral buckle; PPV pars plana vitrectomy; VH Vitreous hemorrhage; PRP panretinal laser photocoagulation; IVK intravitreal kenalog; VMT vitreomacular traction; MH Macular hole;  NVD neovascularization of the disc; NVE neovascularization elsewhere; AREDS age related eye disease study; ARMD age related macular degeneration; POAG primary open angle glaucoma; EBMD  epithelial/anterior basement membrane dystrophy; ACIOL anterior chamber intraocular lens; IOL intraocular lens; PCIOL posterior chamber intraocular lens; Phaco/IOL phacoemulsification with intraocular lens placement; [place] photorefractive keratectomy; LASIK laser assisted in situ keratomileusis; HTN hypertension; DM diabetes mellitus; COPD chronic obstructive pulmonary disease

## 2022-05-11 NOTE — Assessment & Plan Note (Addendum)
Component of wet AMD OS

## 2022-05-11 NOTE — Assessment & Plan Note (Signed)
OS today much improved at shorter interval follow-up with less subretinal fluid and preserved acuity, post Avastin at 7 weeks.  Repeat injection today reevaluate again in 7 weeks

## 2022-05-11 NOTE — Assessment & Plan Note (Signed)
No signs of recurrence 

## 2022-05-12 ENCOUNTER — Other Ambulatory Visit (HOSPITAL_COMMUNITY): Payer: Self-pay | Admitting: Hematology

## 2022-05-12 DIAGNOSIS — Z1231 Encounter for screening mammogram for malignant neoplasm of breast: Secondary | ICD-10-CM

## 2022-05-16 ENCOUNTER — Ambulatory Visit (HOSPITAL_COMMUNITY): Payer: Medicare Other | Admitting: Physician Assistant

## 2022-05-17 NOTE — Progress Notes (Addendum)
[facility] [address], [place] [address]   CLINIC:  Medical Oncology/Hematology  PCP:  Carylon Perches, MD [address] / [place] [address] [phone]   REASON FOR VISIT:  Follow-up for left-sided breast cancer and JAK2 + polycythemia vera/essential thrombocytosis   PRIOR THERAPY: Left-sided lumpectomy with adjuvant XRT   CURRENT THERAPY: - Letrozole daily since January 2019 - Hydrea (current dose 1000 mg M/W/F and 500 mg T/T/S/S)  BRIEF ONCOLOGIC HISTORY:  Oncology History  Malignant neoplasm of upper-outer quadrant of left breast in female, estrogen receptor positive (HCC)  07/10/2017 Surgery   Left lumpectomy: IDC grade 2, 1.3 cm, intermediate grade DCIS, margins negative, 1/2 lymph nodes positive, ER 5% positive weak staining, PR 0%, HER-2 negative ratio 1.73, Ki-67 15%, T1cN0 stage IA AJCC 8   07/10/2017 Miscellaneous   Mammaprint: Low risk, 10-year risk of recurrence untreated 10%   10/01/2017 - 11/14/2017 Radiation Therapy   Adj XRT   11/2017 -  Anti-estrogen oral therapy   Letrozole daily     CANCER STAGING:  Cancer Staging  Malignant neoplasm of upper-outer quadrant of left breast in female, estrogen receptor positive (HCC) Staging form: Breast, AJCC 8th Edition - Clinical stage from 05/22/2017: Stage IA (cT1, cN0, cM0, G1, ER+, PR-, HER2-) - Signed by Hubbard Hartshorn, NP on 06/22/2017 - Pathologic: Stage IIA (pT1c, pN1a, cM0, G2, ER+, PR-, HER2-) - Unsigned   INTERVAL HISTORY:  [name], a 81 y.o. female, returns for routine follow-up of her left-sided breast cancer and JAK2 + polycythemia vera/essential thrombocytosis. [name] was last seen on 01/26/2022 by Rojelio Brenner PA-C.   Her history of left-sided breast cancer was addressed during her visit in March 2023 and is not addressed in detail during today's visit.  She denies any new lumps or bumps, new bone pain, or B symptoms such as fever, chills, or night  sweats.  She continues to have intermittent "tightening and pulling pain" underneath her left arm, but not as much lately.  Since her last visit, she held Hydrea temporarily due to dental surgery (extraction of her 6 remaining teeth) on 04/09/2022.  She was not able to resume Hydrea until 2 weeks later due to delayed healing of mouth.  She resumed Hydrea on 04/23/2022, at the alternating dose of Hydrea 2 tablets (1000 mg) M/W/F and 1 tablet (500 mg) T/T/S/S. She is scheduled for dental implants on 07/11/2022.  She reports that her mouth is continuing to heal slowly and that she has some small sores at the site of her dental extractions, but that they are slowly improving.  Otherwise, she denies any skin sores or GI side effects of her Hydrea.  She reports that her energy has been slightly lower than usual.  She is having some worsening shortness of breath and intermittent episodes of hypoxia, and has been referred to pulmonology by her cardiologist.  She continues to take aspirin 81 mg daily.  She denies any aquagenic pruritus, Raynaud's, erythromelalgia. No vasomotor symptoms such as dizziness, tinnitus, intermittent blurry vision (she has chronic visual deficit from macular degeneration), strokelike symptoms, or neuropathy.  She reports  40% energy and  80% appetite.  She is maintaining stable weight at this time.   REVIEW OF SYSTEMS:    Review of Systems  Constitutional:  Positive for fatigue. Negative for appetite change, chills, diaphoresis, fever and unexpected weight change.  HENT:   Positive for mouth sores (healing sores from dental extraction in June  2023). Negative for lump/mass and nosebleeds.   Eyes:  Negative for eye problems.  Respiratory:  Positive for shortness of breath (with exertion). Negative for cough and hemoptysis.   Cardiovascular:  Negative for chest pain, leg swelling and palpitations.  Gastrointestinal:  Negative for abdominal pain, blood in stool, constipation, diarrhea,  nausea and vomiting.  Genitourinary:  Negative for hematuria.   Skin: Negative.   Neurological:  Positive for headaches. Negative for dizziness and light-headedness.  Hematological:  Does not bruise/bleed easily.  Psychiatric/Behavioral:  Positive for sleep disturbance.     PAST MEDICAL/SURGICAL HISTORY:  Past Medical History:  Diagnosis Date   Arthritis    Chronic diastolic heart failure (HCC)    Essential hypertension    GERD (gastroesophageal reflux disease)    Gout    Hiatal hernia    History of endometrial cancer    Sp hysterectomy 2012   Ischemic colitis (HCC) 2016   Myeloproliferative disorder, JAK-2 positive    Obesity    Pneumonia    Polycythemia vera(238.4)    TIA (transient ischemic attack)    Past Surgical History:  Procedure Laterality Date   ABDOMINAL HYSTERECTOMY  09/12/2011   Procedure: HYSTERECTOMY ABDOMINAL;  Surgeon: Laurette Schimke, MD;  Location: [facility];  Service: Gynecology;  Laterality: N/A;  Total Abdominal Hysterectomy, Bilateral Salpingo Oophorectomy   BREAST LUMPECTOMY Left 07/10/2017   LEFT BREAST LUMPECTOMY WITH RADIOACTIVE SEED AND SENTINEL LYMPH NODE BIOPSY    BREAST LUMPECTOMY WITH RADIOACTIVE SEED AND SENTINEL LYMPH NODE BIOPSY Left 07/10/2017   Procedure: LEFT BREAST LUMPECTOMY WITH RADIOACTIVE SEED AND SENTINEL LYMPH NODE BIOPSY;  Surgeon: Emelia Loron, MD;  Location: [facility] OR;  Service: General;  Laterality: Left;   COLONOSCOPY  07/05/2012   Procedure: COLONOSCOPY;  Surgeon: Malissa Hippo, MD;  Location: [facility];  Service: Endoscopy;  Laterality: N/A;  730   EYE SURGERY     bilateral 8 - 12 yrs ago   FLEXIBLE SIGMOIDOSCOPY N/A 04/03/2015   Procedure: FLEXIBLE SIGMOIDOSCOPY;  Surgeon: Malissa Hippo, MD;  Location: [facility];  Service: Endoscopy;  Laterality: N/A;   HYSTEROSCOPY WITH D & C  08/15/2011   Procedure: DILATATION AND CURETTAGE (D&C) /HYSTEROSCOPY;  Surgeon: Tilda Burrow, MD;  Location: [facility];  Service: Gynecology;   Laterality: N/A;  With Suction Curette   JOINT REPLACEMENT     left knee replaced 2014   KNEE ARTHROPLASTY Left 03/15/2015   Procedure: COMPUTER ASSISTED TOTAL KNEE ARTHROPLASTY;  Surgeon: Eldred Manges, MD;  Location: [facility] OR;  Service: Orthopedics;  Laterality: Left;   SALPINGOOPHORECTOMY  09/12/2011   Procedure: SALPINGO OOPHERECTOMY;  Surgeon: Laurette Schimke, MD;  Location: [facility];  Service: Gynecology;  Laterality: Bilateral;    SOCIAL HISTORY:  Social History   Socioeconomic History   Marital status: Widowed    Spouse name: Not on file   Number of children: Not on file   Years of education: Not on file   Highest education level: Not on file  Occupational History   Not on file  Tobacco Use   Smoking status: Never   Smokeless tobacco: Never  Vaping Use   Vaping Use: Never used  Substance and Sexual Activity   Alcohol use: No   Drug use: No   Sexual activity: Never    Birth control/protection: Surgical    Comment: hysterectomy  Other Topics Concern   Not on file  Social History Narrative   Not on file   Social Determinants of Health  Financial Resource Strain: Low Risk  (01/19/2021)   Overall Financial Resource Strain (CARDIA)    Difficulty of Paying Living Expenses: Not hard at all  Food Insecurity: No Food Insecurity (01/19/2021)   Hunger Vital Sign    Worried About Running Out of Food in the Last Year: Never true    Ran Out of Food in the Last Year: Never true  Transportation Needs: No Transportation Needs (01/19/2021)   PRAPARE - Administrator, Civil Service (Medical): No    Lack of Transportation (Non-Medical): No  Physical Activity: Insufficiently Active (01/19/2021)   Exercise Vital Sign    Days of Exercise per Week: 3 days    Minutes of Exercise per Session: 30 min  Stress: No Stress Concern Present (01/19/2021)   [employer] of Occupational Health - Occupational Stress Questionnaire    Feeling of Stress : Only a little  Social Connections:  Moderately Integrated (01/19/2021)   Social Connection and Isolation Panel [NHANES]    Frequency of Communication with Friends and Family: More than three times a week    Frequency of Social Gatherings with Friends and Family: More than three times a week    Attends Religious Services: More than 4 times per year    Active Member of [employer] or Organizations: No    Attends Banker Meetings: Never    Marital Status: Married  Catering manager Violence: Not At Risk (01/19/2021)   Humiliation, Afraid, Rape, and Kick questionnaire    Fear of Current or Ex-Partner: No    Emotionally Abused: No    Physically Abused: No    Sexually Abused: No    FAMILY HISTORY:  Family History  Problem Relation Age of Onset   Hypertension Mother    Lung cancer Father    Arthritis/Rheumatoid Sister    Anesthesia problems Neg Hx    Hypotension Neg Hx    Malignant hyperthermia Neg Hx    Pseudochol deficiency Neg Hx     CURRENT MEDICATIONS:  Current Outpatient Medications  Medication Sig Dispense Refill   albuterol (VENTOLIN HFA) 108 (90 Base) MCG/ACT inhaler Inhale 2 puffs into the lungs every 6 (six) hours as needed for wheezing or shortness of breath.     aspirin EC 81 MG tablet Take 81 mg by mouth at bedtime.      carvedilol (COREG) 12.5 MG tablet Take 12.5 mg by mouth 2 (two) times daily with a meal. Take 1/2 tablet by 2 times daily     cloNIDine (CATAPRES) 0.3 MG tablet Take 0.15 mg by mouth 2 (two) times daily.     diltiazem (CARDIZEM CD) 180 MG 24 hr capsule Take 180 mg by mouth daily.      doxylamine, Sleep, (SLEEP AID) 25 MG tablet Take 25 mg by mouth at bedtime as needed.     esomeprazole (NEXIUM) 40 MG capsule Take 1 capsule (40 mg total) by mouth every morning. 30 capsule 11   furosemide (LASIX) 40 MG tablet Take 1.5 tablets (60 mg total) by mouth daily. 135 tablet 3   hydroxyurea (HYDREA) 500 MG capsule Take 2 tablets on Monday, Wednesday and Friday and 1 tablet daily rest of the week.   May take with food to minimize GI side effects. 90 capsule 3   isosorbide mononitrate (IMDUR) 30 MG 24 hr tablet Take 15 mg by mouth daily.     letrozole (FEMARA) 2.5 MG tablet Take 1 tablet by mouth once daily 90 tablet 0   losartan (COZAAR) 100  MG tablet Take 100 mg by mouth every evening.      Multiple Vitamins-Minerals (PRESERVISION AREDS PO) Take by mouth.     potassium chloride (KLOR-CON) 10 MEQ tablet Take 1 tablet (10 mEq total) by mouth daily. 90 tablet 3   Simethicone (GAS RELIEF PO) Take 1 tablet by mouth daily.     simvastatin (ZOCOR) 10 MG tablet Take 10 mg by mouth every evening.      Vitamin D, Ergocalciferol, (DRISDOL) 1.25 MG (50000 UNIT) CAPS capsule Take 1 capsule by mouth once a week 12 capsule 0   No current facility-administered medications for this visit.    ALLERGIES:  Allergies  Allergen Reactions   Carbamazepine Hives and Other (See Comments)    headache   Sulfa Antibiotics     Rash    PHYSICAL EXAM:    Performance status (ECOG): 2 - Symptomatic, <50% confined to bed  There were no vitals filed for this visit. Wt Readings from Last 3 Encounters:  04/06/22 232 lb 6.4 oz (105.4 kg)  01/26/22 229 lb 11.5 oz (104.2 kg)  01/10/22 246 lb (111.6 kg)   Physical Exam Constitutional:      Appearance: Normal appearance. She is obese.  HENT:     Head: Normocephalic and atraumatic.     Mouth/Throat:     Mouth: Mucous membranes are moist.  Eyes:     Extraocular Movements: Extraocular movements intact.     Pupils: Pupils are equal, round, and reactive to light.  Cardiovascular:     Rate and Rhythm: Normal rate and regular rhythm.     Pulses: Normal pulses.     Heart sounds: Murmur (coarse murmur over left upper sternal border) heard.  Pulmonary:     Effort: Pulmonary effort is normal.     Breath sounds: Normal breath sounds.  Chest:     Comments: Breast exam deferred today. Abdominal:     General: Bowel sounds are normal.     Palpations: Abdomen is  soft.     Tenderness: There is no abdominal tenderness.  Musculoskeletal:        General: No swelling.     Right lower leg: Edema present.     Left lower leg: Edema (chronic-appearing lymphedema of bilateral lower extremities with associated skin thickening) present.  Lymphadenopathy:     Cervical: No cervical adenopathy.  Skin:    General: Skin is warm and dry.  Neurological:     General: No focal deficit present.     Mental Status: She is alert and oriented to person, place, and time.  Psychiatric:        Mood and Affect: Mood normal.        Behavior: Behavior normal.      LABORATORY DATA:  I have reviewed the labs as listed.     Latest Ref Rng & Units 04/27/2022    1:15 PM 01/16/2022    1:09 PM 10/03/2021    1:09 PM  CBC  WBC 4.0 - 10.5 K/uL 8.1  7.1  7.7   Hemoglobin 12.0 - 15.0 g/dL 65.7  84.6  96.2   Hematocrit 36.0 - 46.0 % 49.2  45.4  46.7   Platelets 150 - 400 K/uL 520  448  447       Latest Ref Rng & Units 04/27/2022    1:15 PM 01/16/2022    1:09 PM 10/03/2021    1:14 PM  CMP  Glucose 70 - 99 mg/dL 98  952  841   BUN  8 - 23 mg/dL 16  21  21    Creatinine 0.44 - 1.00 mg/dL 4.09  8.11  9.14   Sodium 135 - 145 mmol/L 137  142  139   Potassium 3.5 - 5.1 mmol/L 4.2  4.3  4.3   Chloride 98 - 111 mmol/L 102  102  103   CO2 22 - 32 mmol/L 29  32  31   Calcium 8.9 - 10.3 mg/dL 9.4  9.4  9.2   Total Protein 6.5 - 8.1 g/dL 8.0  7.3    Total Bilirubin 0.3 - 1.2 mg/dL 0.6  0.9    Alkaline Phos 38 - 126 U/L 97  84    AST 15 - 41 U/L 19  15    ALT 0 - 44 U/L 12  12      DIAGNOSTIC IMAGING:  I have independently reviewed the scans and discussed with the patient. Intravitreal Injection, Pharmacologic Agent - OS - Left Eye  Result Date: 05/11/2022 Time Out 05/11/2022. 4:10 PM. Confirmed correct patient, procedure, site, and patient consented. Anesthesia Topical anesthesia was used. Anesthetic medications included Lidocaine 4%. Procedure Preparation included 5% betadine to  ocular surface, 10% betadine to eyelids, Tobramycin 0.3%. A 30 gauge needle was used. Injection: 2.5 mg bevacizumab 2.5 MG/0.1ML   Route: Intravitreal, Site: Left Eye   NDC: [id number], Lot: [id number], Expiration date: 07/14/2022 Post-op Post injection exam found visual acuity of at least counting fingers. The patient tolerated the procedure well. There were no complications. The patient received written and verbal post procedure care education. Post injection medications included ocuflox.   OCT, Retina - OU - Both Eyes  Result Date: 05/11/2022 Right Eye Quality was good. Scan locations included subfoveal. Central Foveal Thickness: 259. Progression has been stable. Findings include no IRF, no SRF. Left Eye Quality was good. Scan locations included subfoveal. Central Foveal Thickness: 270. Progression has been stable. Findings include no IRF, abnormal foveal contour, subretinal fluid. Notes OS today at  weeks post injection we planned for repeat injection today,  improved residual subretinal fluid today at this interval.  Repeat injection today and maintain interval examination OS next 7 weeks OD today now over 1 year post last injection stable no sign of CNVM    ASSESSMENT & PLAN: 1.  JAK2 + essential thrombocytosis versus polycythemia vera - JAK2 V617F mutation detected in 2015 - No prior history of thrombosis - Hydroxyurea was started in November 2019 - Current dose of hydroxyurea 2 tablets (1000 mg) M/W/F, 1 tablet (500 mg) T/T/S/S - Since her last visit, she held Hydrea temporarily due to tooth extraction on 04/09/2022.  She was not able to resume Hydrea until 2 weeks later due to delayed healing of mouth.  She resumed Hydrea on 04/23/2022, at the alternating dose of Hydrea 2 tablets (1000 mg) M/W/F and 1 tablet (500 mg) T/T/S/S. - She is tolerating her current dose of Hydrea well, although she continues to have some slowly healing sores at the site of her recent dental extractions - No vasomotor  symptoms, erythromelalgia, or aquagenic pruritus     - She is taking aspirin 81 mg daily. - Most recent CBC (04/27/2022): Platelets 520, Hgb 15.9/HCT 49.2/MCV 102.1, normal WBC 8.1.  Normal LDH and CMP. /HCT 45.4/MCV 104.8, WBC 7.1 - Platelets are the primarily affected cell line, but she does have intermittent elevations in Hgb/HCT, which could indicate polycythemia vera rather than essential thrombocytosis.  No change in management or monitoring strategy.  Goal remains platelets <400  and HCT <45.0%. - PLAN: We will increase her Hydrea to 1000 mg Monday through Friday and 500 mg on Saturday and Sunday. - She is scheduled for dental implants on 07/11/2022. - She can hold Hydrea 2 days prior to surgery and resume 1 to 2 weeks after surgery to facilitate wound healing. - We will check blood counts week prior to her upcoming dental surgery (week of 07/03/2022). - We will check full lab panel about 1 month after dental surgery (first week of October), with follow-up the next week. - Continue aspirin 81 mg daily - Patient is aware of alarm symptoms that would prompt more immediate medical attention.  2.  T1CN1A (stage IIa) malignant neoplasm of upper-outer quadrant of left breast -Not addressed specifically during this visit - was addressed in March 2023 and is scheduled for breast exam and follow-up every 6 months (next due September/October 2023)  - ROS negative for any red flag symptoms for recurrent breast cancer   - PLAN: Next mammogram due July 2023 (scheduled for 05/25/2022).  Repeat breast exam and follow-up in 3 months (September/October 2023).  We will check relevant labs at next visit.   3.  Weight loss   - According to weight (229 pounds) obtained on 01/26/2022, patient had lost 17 pounds in the 2 weeks.  There is some question as to the accuracy of this, perhaps some discrepancy between different scales. - Weight today is 238, which she reports is "pretty much her baseline" - Some of her  weight fluctuations may have been fluid weight, as the patient recently had her dose of Lasix increased. - She reports that her clothes are fitting the same at home. - She reports baseline activity level and food intake. - PLAN: No additional follow-up needed at present. - We will continue to monitor weight at follow-up visits.  4.  Social/family history: -She was recently widowed in February 2022.  She is living independently.  She works part-time for 6 hours a day, office job.  She is non-smoker. - Father had lung cancer.   All questions were answered. The patient knows to call the clinic with any problems, questions or concerns.  Medical decision making: Moderate    Time spent on visit: I spent 20 minutes counseling the patient face to face. The total time spent in the appointment was 30 minutes and more than 50% was on counseling.   Carnella Guadalajara, PA-C  05/18/2022 7:06 PM

## 2022-05-18 ENCOUNTER — Inpatient Hospital Stay (HOSPITAL_COMMUNITY): Payer: Medicare Other | Attending: Hematology | Admitting: Physician Assistant

## 2022-05-18 VITALS — BP 151/88 | HR 62 | Temp 98.7°F | Resp 19 | Ht 64.0 in | Wt 238.4 lb

## 2022-05-18 DIAGNOSIS — Z170 Estrogen receptor positive status [ER+]: Secondary | ICD-10-CM | POA: Insufficient documentation

## 2022-05-18 DIAGNOSIS — C50412 Malignant neoplasm of upper-outer quadrant of left female breast: Secondary | ICD-10-CM | POA: Diagnosis not present

## 2022-05-18 DIAGNOSIS — Z8542 Personal history of malignant neoplasm of other parts of uterus: Secondary | ICD-10-CM | POA: Diagnosis not present

## 2022-05-18 DIAGNOSIS — Z90710 Acquired absence of both cervix and uterus: Secondary | ICD-10-CM | POA: Insufficient documentation

## 2022-05-18 DIAGNOSIS — D45 Polycythemia vera: Secondary | ICD-10-CM | POA: Diagnosis not present

## 2022-05-18 DIAGNOSIS — Z90721 Acquired absence of ovaries, unilateral: Secondary | ICD-10-CM | POA: Diagnosis not present

## 2022-05-18 DIAGNOSIS — E559 Vitamin D deficiency, unspecified: Secondary | ICD-10-CM | POA: Diagnosis not present

## 2022-05-18 DIAGNOSIS — G479 Sleep disorder, unspecified: Secondary | ICD-10-CM | POA: Diagnosis not present

## 2022-05-18 DIAGNOSIS — R0602 Shortness of breath: Secondary | ICD-10-CM | POA: Insufficient documentation

## 2022-05-18 DIAGNOSIS — Z8261 Family history of arthritis: Secondary | ICD-10-CM | POA: Insufficient documentation

## 2022-05-18 DIAGNOSIS — D473 Essential (hemorrhagic) thrombocythemia: Secondary | ICD-10-CM | POA: Insufficient documentation

## 2022-05-18 DIAGNOSIS — Z79899 Other long term (current) drug therapy: Secondary | ICD-10-CM | POA: Diagnosis not present

## 2022-05-18 DIAGNOSIS — Z7982 Long term (current) use of aspirin: Secondary | ICD-10-CM | POA: Insufficient documentation

## 2022-05-18 DIAGNOSIS — Z8249 Family history of ischemic heart disease and other diseases of the circulatory system: Secondary | ICD-10-CM | POA: Insufficient documentation

## 2022-05-18 DIAGNOSIS — Z79811 Long term (current) use of aromatase inhibitors: Secondary | ICD-10-CM | POA: Insufficient documentation

## 2022-05-18 DIAGNOSIS — Z801 Family history of malignant neoplasm of trachea, bronchus and lung: Secondary | ICD-10-CM | POA: Diagnosis not present

## 2022-05-18 DIAGNOSIS — D75839 Thrombocytosis, unspecified: Secondary | ICD-10-CM | POA: Diagnosis not present

## 2022-05-18 DIAGNOSIS — Z882 Allergy status to sulfonamides status: Secondary | ICD-10-CM | POA: Insufficient documentation

## 2022-05-18 DIAGNOSIS — R519 Headache, unspecified: Secondary | ICD-10-CM | POA: Insufficient documentation

## 2022-05-18 DIAGNOSIS — Z8673 Personal history of transient ischemic attack (TIA), and cerebral infarction without residual deficits: Secondary | ICD-10-CM | POA: Insufficient documentation

## 2022-05-18 MED ORDER — HYDROXYUREA 500 MG PO CAPS: ORAL_CAPSULE | ORAL | 3 refills | Status: DC

## 2022-05-18 NOTE — Patient Instructions (Signed)
[place] at [facility] Discharge Instructions  You were seen today by Tarri Abernethy PA-C for the following conditions.  HISTORY OF LEFT BREAST CANCER: This was not specifically addressed during your visit today.  We will follow results of your upcoming mammogram and to discuss your history of breast cancer in more detail at your follow-up visit in October.  ESSENTIAL THROMBOCYTOSIS: Your platelets and blood count are slightly higher than their target range.   We will INCREASE your Hydrea so that you are taking 1000 mg (2 tablets) Monday through Friday and 500 mg (1 tablet) on Saturday and Sunday.  (New prescription will be sent to pharmacy to reflect these changes). Since we have increased the dose of your Hydrea and you have also had recent dental surgery, please pay close attention for any new mouth sores or complications from your recent tooth extraction. We will recheck your blood counts the week prior to your upcoming dental surgery (week of July 03, 2022) Prior to your upcoming dental surgery, you can stop Hydrea 2 days prior to your procedure and resume Hydrea 1 to 2 weeks after your procedure to allow for better wound healing. We will check full lab panel about 1 month after your dental surgery (first week of October 2023). I will see you for office visit and follow-up for second week of October.  MEDICATIONS: - Continue hydroxyurea (Hydrea) 2 tablets (1000 mg) Monday through Friday and 1 tablet (500 mg) Saturday and Sunday - Continue vitamin D once per week.  FOLLOW-UP APPOINTMENT: Office visit in 3 months, after labs   Thank you for choosing [place] at [facility] to provide your oncology and hematology care.  To afford each patient quality time with our provider, please arrive at least 15 minutes before your scheduled appointment time.   If you have a lab appointment with the [facility] please come in thru the Main  Entrance and check in at the main information desk.  You need to re-schedule your appointment should you arrive 10 or more minutes late.  We strive to give you quality time with our providers, and arriving late affects you and other patients whose appointments are after yours.  Also, if you no show three or more times for appointments you may be dismissed from the clinic at the providers discretion.     Again, thank you for choosing [facility].  Our hope is that these requests will decrease the amount of time that you wait before being seen by our physicians.       _____________________________________________________________  Should you have questions after your visit to [facility], please contact our office at [phone] and follow the prompts.  Our office hours are 8:00 a.m. and 4:30 p.m. Monday - Friday.  Please note that voicemails left after 4:00 p.m. may not be returned until the following business day.  We are closed weekends and major holidays.  You do have access to a nurse 24-7, just call the main number to the clinic [phone] and do not press any options, hold on the line and a nurse will answer the phone.    For prescription refill requests, have your pharmacy contact our office and allow 72 hours.    Due to Covid, you will need to wear a mask upon entering the hospital. If you do not have a mask, a mask will be given to you at the Main Entrance upon arrival. For  doctor visits, patients may have 1 support person age 63 or older with them. For treatment visits, patients can not have anyone with them due to social distancing guidelines and our immunocompromised population.

## 2022-05-23 ENCOUNTER — Ambulatory Visit: Payer: Medicare Other | Admitting: Internal Medicine

## 2022-05-23 ENCOUNTER — Encounter: Payer: Self-pay | Admitting: Internal Medicine

## 2022-05-23 DIAGNOSIS — R0609 Other forms of dyspnea: Secondary | ICD-10-CM

## 2022-05-23 DIAGNOSIS — J9611 Chronic respiratory failure with hypoxia: Secondary | ICD-10-CM | POA: Insufficient documentation

## 2022-05-23 NOTE — Assessment & Plan Note (Addendum)
Started on 02 post op in 2012  - 05/23/2022   Walked on 3lpm  x  3  lap(s) =  approx 450  ft  @ slow/ rollator  pace, stopped due to end of study with lowest 02 sats 90%   - ONO on 2lpm  Until we sort out mech for hypoxemia rec 2pm hs and titrate with ex to keep sats > 90%   Referred for Inogen if can qualify   Discussed in detail all the  indications, usual  risks and alternatives  relative to the benefits with patient who agrees to proceed with w/u as outlined.     Each maintenance medication was reviewed in detail including emphasizing most importantly the difference between maintenance and prns and under what circumstances the prns are to be triggered using an action plan format where appropriate.  Total time for H and P, chart review, counseling, reviewing hfa device(s) , directly observing portions of ambulatory 02 saturation study/ and generating customized AVS unique to this office visit / same day charting > 60 min for  Refractory cardi respiratory problems of uncertain etiology with pt new to me

## 2022-05-23 NOTE — Assessment & Plan Note (Addendum)
Onset 2012 p abd hysterectomy at wt 272  - Echo  09/13/21 1. Left ventricular ejection fraction, by estimation, is 65 to 70%. The  left ventricle has normal function. The left ventricle has no regional  wall motion abnormalities. There is moderate left ventricular hypertrophy.  Left ventricular diastolic  parameters are indeterminate.  2. Right ventricular systolic function is normal. The right ventricular  size is normal. There is severely elevated pulmonary artery systolic  pressure.  3. Left atrial size was severely dilated.  4. The mitral valve is abnormal. No evidence of mitral valve  regurgitation. Mild to moderate mitral stenosis. Moderate mitral annular  calcification.  5. The tricuspid valve is abnormal.  6. The aortic valve is tricuspid. There is moderate calcification of the  aortic valve. There is moderate thickening of the aortic valve. Aortic  valve regurgitation is not visualized. Mild aortic valve stenosis. Aortic  valve mean gradient measures 10.0  mmHg. Aortic valve peak gradient measures 22.7 mmHg. Aortic valve area, by  VTI measures 2.11 cm  7. The inferior vena cava is dilated in size with >50% respiratory  variability, suggesting right atrial pressure of 8 mmHg.  8. : Right atrial size was normal in size -  PFT's  03/23/11   FEV1 1.31 (61 % ) ratio 0.71  p 0 % improvement from saba p ? prior to study with DLCO  19.4 (73% %)  and FV curve min concave and ERV  370 cc at wt 270    Multifactorial doe with unexplained hypoxemia x > 10 y but worse over last year ? Why  Clearly badly deconditioned but that does not explain the hypoxemia or severe LAE.  Will need HRCT/ pfts next available in [place] and then consider RHC looking for a shunt/ better accuracy of valvular dz if nothing turns up other than restriction from obesity (note her wt is actually down vs onset but still has significant centripetal obesity contributing disproportionately to her symptoms.

## 2022-05-23 NOTE — Patient Instructions (Addendum)
Try using 2lpm at bedtime for now and we will do an overnight study to be sure that's enough    Make sure you check your oxygen saturation  AT  your highest level of activity (not after you stop)   to be sure it stays over 90% and adjust  02 flow upward to maintain this level if needed but remember to turn it back to previous settings when you stop (to conserve your supply).  To start with I would use 3lpm with your present portable 02 tank.   We will have the [employer]ogen rep call you re supplying you with a portable oxygen concentrator but it may not be enough to keep your 02 sats above 90% which is the goal here.   My office will be contacting you by phone for referral for High resolution CT of chest (coming in Thursday anyway for mammogram  Continue Nexium 40 mg Take 30-60 min before first meal of the day   GERD (REFLUX)  is an extremely common cause of respiratory symptoms just like yours , many times with no obvious heartburn at all.    It can be treated with medication, but also with lifestyle changes including elevation of the head of your bed (ideally with 6 -8inch blocks under the headboard of your bed),  Smoking cessation, avoidance of late meals, excessive alcohol, and avoid fatty foods, chocolate, peppermint, colas, red wine, and acidic juices such as orange juice.  NO MINT OR MENTHOL PRODUCTS SO NO COUGH DROPS  USE SUGARLESS CANDY INSTEAD (Jolley ranchers or Stover's or Life Savers) or even ice chips will also do - the key is to swallow to prevent all throat clearing. NO OIL BASED VITAMINS - use powdered substitutes.  Avoid fish oil when coughing.   We will schedule a return to my office in [place] with PFTs same day.

## 2022-05-23 NOTE — Progress Notes (Addendum)
[name], female    DOB: 04-28-1941    MRN: [id number]   Brief patient profile:  79   yowf  never smoker  referred to pulmonary clinic in [place]  05/23/2022 by Dr MDowell  for unexplained hypoxemia dating back to time of  abd hysterectomy in 2012 at wt 41 and supplied with 02 but rarely used it but noted difficulty from [facility] parking into her work around summer 2022   L RT side was 2019     History of Present Illness  05/23/2022  Pulmonary/ 1st office eval/ Ahmarion Saraceno / [facility]  Chief Complaint  Patient presents with   Consult    Patient was seeing Dr. Luan Pulling has been on O2 for years- prn during day and sometimes at night. She states her breathing has worsened over last year.   Dyspnea:  50 ft and 02 drops Cough: just hoarse  Sleep: 30 degree on side - immediate smothering if lies flat  SABA use: not sure they help  Vax x 2 for covid / never infected   No obvious day to day or daytime pattern/variability or assoc excess/ purulent sputum or mucus plugs or hemoptysis or cp or chest tightness, subjective wheeze or overt sinus or hb symptoms.   Sleeping as above without nocturnal  or early am exacerbation  of respiratory  c/o's or need for noct saba. Also denies any obvious fluctuation of symptoms with weather or environmental changes or other aggravating or alleviating factors except as outlined above   No unusual exposure hx or h/o childhood pna/ asthma or knowledge of premature birth.  Current Allergies, Complete Past Medical History, Past Surgical History, Family History, and Social History were reviewed in [employer] record.  ROS  The following are not active complaints unless bolded Hoarseness, sore throat, dysphagia, dental problems, itching, sneezing,  nasal congestion or discharge of excess mucus or purulent secretions, ear ache,   fever, chills, sweats, unintended wt loss or wt gain, classically pleuritic or exertional cp,  orthopnea pnd or  arm/hand swelling  or leg swelling, presyncope, palpitations, abdominal pain, anorexia, nausea, vomiting, diarrhea  or change in bowel habits or change in bladder habits, change in stools or change in urine, dysuria, hematuria,  rash, arthralgias, visual complaints, headache, numbness, weakness or ataxia or problems with walking or coordination,  change in mood or  memory.           Past Medical History:  Diagnosis Date   Arthritis    Chronic diastolic heart failure ([place])    Essential hypertension    GERD (gastroesophageal reflux disease)    Gout    Hiatal hernia    History of endometrial cancer    Sp hysterectomy 2012   Ischemic colitis ([place]) 2016   Myeloproliferative disorder, JAK-2 positive    Obesity    Pneumonia    Polycythemia vera(238.4)    TIA (transient ischemic attack)     Outpatient Medications Prior to Visit  Medication Sig Dispense Refill   albuterol (VENTOLIN HFA) 108 (90 Base) MCG/ACT inhaler Inhale 2 puffs into the lungs every 6 (six) hours as needed for wheezing or shortness of breath.     aspirin EC 81 MG tablet Take 81 mg by mouth at bedtime.      carvedilol (COREG) 12.5 MG tablet Take 12.5 mg by mouth 2 (two) times daily with a meal. Take 1/2 tablet by 2 times daily     chlorhexidine (PERIDEX) 0.12 % solution SMARTSIG:15 Milliliter(s)  By Mouth 2-3 Times Daily PRN     cloNIDine (CATAPRES) 0.3 MG tablet Take 0.15 mg by mouth 2 (two) times daily.     diltiazem (CARDIZEM CD) 180 MG 24 hr capsule Take 180 mg by mouth daily.      doxylamine, Sleep, (SLEEP AID) 25 MG tablet Take 25 mg by mouth at bedtime as needed.     esomeprazole (NEXIUM) 40 MG capsule Take 1 capsule (40 mg total) by mouth every morning. 30 capsule 11   furosemide (LASIX) 40 MG tablet Take 1.5 tablets (60 mg total) by mouth daily. 135 tablet 3   hydroxyurea (HYDREA) 500 MG capsule Take 2 tablets Monday through Friday; take 1 tablet on Saturday and Sunday.  May take with food to minimize GI side  effects. 110 capsule 3   ibuprofen (ADVIL) 800 MG tablet Take 800 mg by mouth every 6 (six) hours as needed.     isosorbide mononitrate (IMDUR) 30 MG 24 hr tablet Take 15 mg by mouth daily.     letrozole (FEMARA) 2.5 MG tablet Take 1 tablet by mouth once daily 90 tablet 0   losartan (COZAAR) 100 MG tablet Take 100 mg by mouth every evening.      Multiple Vitamins-Minerals (PRESERVISION AREDS PO) Take by mouth.     Simethicone (GAS RELIEF PO) Take 1 tablet by mouth daily.     simvastatin (ZOCOR) 10 MG tablet Take 10 mg by mouth every evening.      Vitamin D, Ergocalciferol, (DRISDOL) 1.25 MG (50000 UNIT) CAPS capsule Take 1 capsule by mouth once a week 12 capsule 0   potassium chloride (KLOR-CON) 10 MEQ tablet Take 1 tablet (10 mEq total) by mouth daily. 90 tablet 3   No facility-administered medications prior to visit.     Objective:     BP (!) 148/88 (BP Location: Right Arm, Patient Position: Sitting)   Pulse 68   Temp 98.5 F (36.9 C) (Temporal)   Ht '5\' 4"'$  (1.626 m)   Wt 244 lb (110.7 kg)   SpO2 91% Comment: ra- walking from lobby to room  BMI 41.88 kg/m   SpO2: 91 % (ra- walking from lobby to room)  Hoarse wf nad  at rest but could not stand s assistance or get up on exam step due to R > L knee pain    HEENT : Oropharynx  clear      Nasal turbinates nl    NECK :  without  apparent JVD/ palpable Nodes/TM    LUNGS: no acc muscle use,  Nl contour chest which is clear to A and P bilaterally without cough on insp or exp maneuvers   CV:  RRR   with 2-3 / VI SEM  s   increase in P2, and no edema   ABD:  obese soft and nontender with limited inspiratory excursion .    MS:  Nl gait/ ext warm without deformities Or obvious joint restrictions  calf tenderness, cyanosis or clubbing    SKIN: warm and dry without lesions    NEURO:  alert, approp, nl sensorium with  no motor or cerebellar deficits apparent.          Assessment   DOE (dyspnea on exertion) Onset 2012 p  abd hysterectomy at wt 272  - Echo  09/13/21 1. Left ventricular ejection fraction, by estimation, is 65 to 70%. The  left ventricle has normal function. The left ventricle has no regional  wall motion abnormalities. There is moderate left  ventricular hypertrophy.  Left ventricular diastolic  parameters are indeterminate.   2. Right ventricular systolic function is normal. The right ventricular  size is normal. There is severely elevated pulmonary artery systolic  pressure.   3. Left atrial size was severely dilated.   4. The mitral valve is abnormal. No evidence of mitral valve  regurgitation. Mild to moderate mitral stenosis. Moderate mitral annular  calcification.   5. The tricuspid valve is abnormal.   6. The aortic valve is tricuspid. There is moderate calcification of the  aortic valve. There is moderate thickening of the aortic valve. Aortic  valve regurgitation is not visualized. Mild aortic valve stenosis. Aortic  valve mean gradient measures 10.0  mmHg. Aortic valve peak gradient measures 22.7 mmHg. Aortic valve area, by  VTI measures 2.11 cm   7. The inferior vena cava is dilated in size with >50% respiratory  variability, suggesting right atrial pressure of 8 mmHg.  8. : Right atrial size was normal in size -  PFT's  03/23/11   FEV1 1.31 (61 % ) ratio 0.71  p 0 % improvement from saba p ? prior to study with DLCO  19.4 (73% %)  and FV curve min concave and ERV  370 cc at wt 270    Multifactorial doe with unexplained hypoxemia x > 10 y but worse over last year ? Why  Clearly badly deconditioned but that does not explain the hypoxemia or severe LAE.  Will need HRCT/ pfts next available in [place] and then consider RHC looking for a shunt/ better accuracy of valvular dz if nothing turns up other than restriction from obesity (note her wt is actually down vs onset but still has significant centripetal obesity contributing disproportionately to her symptoms.      Chronic  respiratory failure with hypoxia ([place]) Started on 02 post op in 2012  - 05/23/2022   Walked on 3lpm  x  3  lap(s) =  approx 450  ft  @ slow/ rollator  pace, stopped due to end of study with lowest 02 sats 90%   - ONO on 2lpm  Until we sort out mech for hypoxemia rec 2pm hs and titrate with ex to keep sats > 90%   Referred for [employer] if can qualify   Discussed in detail all the  indications, usual  risks and alternatives  relative to the benefits with patient who agrees to proceed with w/u as outlined.     Each maintenance medication was reviewed in detail including emphasizing most importantly the difference between maintenance and prns and under what circumstances the prns are to be triggered using an action plan format where appropriate.  Total time for H and P, chart review, counseling, reviewing hfa device(s) , directly observing portions of ambulatory 02 saturation study/ and generating customized AVS unique to this office visit / same day charting > 60 min for  Refractory cardi respiratory problems of uncertain etiology with pt new to me                  Christinia Gully, MD 05/23/2022

## 2022-05-24 ENCOUNTER — Telehealth: Payer: Self-pay | Admitting: Internal Medicine

## 2022-05-24 DIAGNOSIS — J9611 Chronic respiratory failure with hypoxia: Secondary | ICD-10-CM

## 2022-05-24 NOTE — Telephone Encounter (Signed)
Pt's daughter states her mom noticed after the bump up to 3L she developed a raspy voice and a sore throat. It disappeared last night once off the O2. Checking to see if this is normal    Dr. Melvyn Novas please advise.

## 2022-05-24 NOTE — Telephone Encounter (Signed)
Called and notified patients daughter she asked that when we place the order to adapt that we also add in the order for them to service her concentrator. Order placed and nothing further needed.

## 2022-05-24 NOTE — Telephone Encounter (Signed)
Needs 02 humidified and that should help

## 2022-05-25 ENCOUNTER — Ambulatory Visit (HOSPITAL_COMMUNITY)
Admission: RE | Admit: 2022-05-25 | Discharge: 2022-05-25 | Disposition: A | Discharge status: Home / Self Care | Payer: Medicare Other | Source: Ambulatory Visit | Attending: Hematology | Admitting: Hematology

## 2022-05-25 DIAGNOSIS — Z1231 Encounter for screening mammogram for malignant neoplasm of breast: Secondary | ICD-10-CM | POA: Diagnosis not present

## 2022-05-26 ENCOUNTER — Other Ambulatory Visit: Payer: Self-pay

## 2022-05-26 DIAGNOSIS — R0609 Other forms of dyspnea: Secondary | ICD-10-CM

## 2022-05-31 DIAGNOSIS — R0683 Snoring: Secondary | ICD-10-CM | POA: Diagnosis not present

## 2022-05-31 DIAGNOSIS — G4730 Sleep apnea, unspecified: Secondary | ICD-10-CM | POA: Diagnosis not present

## 2022-06-06 ENCOUNTER — Telehealth: Payer: Self-pay

## 2022-06-06 ENCOUNTER — Telehealth: Payer: Self-pay | Admitting: Internal Medicine

## 2022-06-06 DIAGNOSIS — J9611 Chronic respiratory failure with hypoxia: Secondary | ICD-10-CM

## 2022-06-06 DIAGNOSIS — G4734 Idiopathic sleep related nonobstructive alveolar hypoventilation: Secondary | ICD-10-CM

## 2022-06-06 NOTE — Telephone Encounter (Signed)
noted 

## 2022-06-06 NOTE — Telephone Encounter (Signed)
Addressed in separate encounter.

## 2022-06-06 NOTE — Telephone Encounter (Signed)
Patients daughter called back and informed front desk staff that:   Pt's daughter stated she is not bumping her overnight oxygen to 3LO2.  States it gives her a sore throat and headache even after being humidified.  Please advise.  Doesn't want another overnight oxygen test.

## 2022-06-06 NOTE — Telephone Encounter (Signed)
-   ONO on 2lpm 05/31/22  desat < 89% x 6 h so rec 3lpm and repeat on 3lpm 06/06/2022 >>>   Called and spoke to patients daughter Malachy Mood and gave her results. She voiced understanding and is aware that ONO needs to be repeated on 3lpm. Order placed. Nothing further needed.

## 2022-06-10 ENCOUNTER — Other Ambulatory Visit (HOSPITAL_COMMUNITY): Payer: Self-pay | Admitting: Physician Assistant

## 2022-06-10 DIAGNOSIS — Z170 Estrogen receptor positive status [ER+]: Secondary | ICD-10-CM

## 2022-06-19 ENCOUNTER — Ambulatory Visit (HOSPITAL_COMMUNITY)
Admission: RE | Admit: 2022-06-19 | Discharge: 2022-06-19 | Disposition: A | Discharge status: Home / Self Care | Payer: Medicare Other | Source: Ambulatory Visit | Attending: Internal Medicine | Admitting: Internal Medicine

## 2022-06-19 ENCOUNTER — Other Ambulatory Visit: Payer: Self-pay | Admitting: *Deleted

## 2022-06-19 DIAGNOSIS — J84112 Idiopathic pulmonary fibrosis: Secondary | ICD-10-CM | POA: Diagnosis not present

## 2022-06-19 DIAGNOSIS — R0902 Hypoxemia: Secondary | ICD-10-CM | POA: Diagnosis not present

## 2022-06-19 DIAGNOSIS — K449 Diaphragmatic hernia without obstruction or gangrene: Secondary | ICD-10-CM | POA: Diagnosis not present

## 2022-06-19 DIAGNOSIS — G4736 Sleep related hypoventilation in conditions classified elsewhere: Secondary | ICD-10-CM | POA: Diagnosis not present

## 2022-06-19 DIAGNOSIS — R0609 Other forms of dyspnea: Secondary | ICD-10-CM | POA: Diagnosis not present

## 2022-06-19 DIAGNOSIS — I700 Atherosclerosis of aorta: Secondary | ICD-10-CM | POA: Diagnosis not present

## 2022-06-19 DIAGNOSIS — Z170 Estrogen receptor positive status [ER+]: Secondary | ICD-10-CM

## 2022-06-19 MED ORDER — VITAMIN D (ERGOCALCIFEROL) 1.25 MG (50000 UNIT) PO CAPS: 50000.0000 [IU] | ORAL_CAPSULE | ORAL | 0 refills | Status: DC

## 2022-06-20 ENCOUNTER — Encounter: Payer: Self-pay | Admitting: Internal Medicine

## 2022-06-20 DIAGNOSIS — J8489 Other specified interstitial pulmonary diseases: Secondary | ICD-10-CM | POA: Insufficient documentation

## 2022-06-26 ENCOUNTER — Encounter: Payer: Self-pay | Admitting: Internal Medicine

## 2022-06-26 ENCOUNTER — Ambulatory Visit: Payer: Medicare Other | Admitting: Internal Medicine

## 2022-06-26 ENCOUNTER — Ambulatory Visit (INDEPENDENT_AMBULATORY_CARE_PROVIDER_SITE_OTHER): Payer: Medicare Other | Admitting: Internal Medicine

## 2022-06-26 DIAGNOSIS — J9611 Chronic respiratory failure with hypoxia: Secondary | ICD-10-CM

## 2022-06-26 DIAGNOSIS — R0609 Other forms of dyspnea: Secondary | ICD-10-CM

## 2022-06-26 DIAGNOSIS — J8489 Other specified interstitial pulmonary diseases: Secondary | ICD-10-CM | POA: Diagnosis not present

## 2022-06-26 LAB — PULMONARY FUNCTION TEST
DL/VA % pred: 111 %
DL/VA: 4.56 ml/min/mmHg/L
DLCO cor % pred: 71 %
DLCO cor: 12.84 ml/min/mmHg
DLCO unc % pred: 76 %
DLCO unc: 13.73 ml/min/mmHg
FEF 25-75 Post: 1.26 L/sec
FEF 25-75 Pre: 1.12 L/sec
FEF2575-%Change-Post: 12 %
FEF2575-%Pred-Post: 101 %
FEF2575-%Pred-Pre: 89 %
FEV1-%Change-Post: 0 %
FEV1-%Pred-Post: 62 %
FEV1-%Pred-Pre: 62 %
FEV1-Post: 1.11 L
FEV1-Pre: 1.11 L
FEV1FVC-%Change-Post: 8 %
FEV1FVC-%Pred-Pre: 111 %
FEV6-%Change-Post: -8 %
FEV6-%Pred-Post: 55 %
FEV6-%Pred-Pre: 60 %
FEV6-Post: 1.25 L
FEV6-Pre: 1.36 L
FEV6FVC-%Change-Post: 0 %
FEV6FVC-%Pred-Post: 105 %
FEV6FVC-%Pred-Pre: 106 %
FVC-%Change-Post: -7 %
FVC-%Pred-Post: 52 %
FVC-%Pred-Pre: 56 %
FVC-Post: 1.26 L
FVC-Pre: 1.36 L
Post FEV1/FVC ratio: 88 %
Post FEV6/FVC ratio: 99 %
Pre FEV1/FVC ratio: 82 %
Pre FEV6/FVC Ratio: 100 %

## 2022-06-26 NOTE — Progress Notes (Unsigned)
[name], female    DOB: [birth year]    MRN: [id number]   Brief patient profile:  45   yowf  never smoker  referred to pulmonary clinic in [place]  05/23/2022 by Dr Domenic Polite  for unexplained hypoxemia dating back to time of  abd hysterectomy in 2012 at wt 66 and supplied with 02 but rarely used it but noted difficulty from [facility] parking into her work around summer 2022        History of Present Illness  05/23/2022  Pulmonary/ 1st office eval/ Mariah Harn / Customer service manager Complaint  Patient presents with   Consult    Patient was seeing Dr. Luan Pulling has been on O2 for years- prn during day and sometimes at night. She states her breathing has worsened over last year.   Dyspnea:  50 ft and 02 drops Cough: just hoarse  Sleep: 30 degree on side - immediate smothering if lies flat  SABA use: not sure they help  Vax x 2 for covid / never infected  Rec Try using 2lpm at bedtime for now and we will do an overnight study to be sure that's enough  Make sure you check your oxygen saturation  AT  your highest level of activity (not after you stop)   to be sure it stays over 90%  We will have the [employer]ogen rep call you re supplying you with a portable oxygen concentrator but it may not be enough to keep your 02 sats above 90% which is the goal here. My office will be contacting you by phone for referral for High resolution CT of chest Continue Nexium 40 mg Take 30-60 min before first meal of the day  GERD diet  We will schedule a return to my office in [place] with PFTs same day.     06/26/2022  f/u ov/Vivaan Helseth re: PF   maint on prn saba and 02   Chief Complaint  Patient presents with   Follow-up    DOE  Dyspnea:  across the parking lot from [facility] parking/ drives to [place]/ still working sitting answering phone  Cough: varies s pattern but dry and mostly daytime  Sleeping: ok  30 degrees elevation  SABA use: none  02: 2-3lpm prn / not using 02 as rec      No obvious day to day or daytime  variability or assoc excess/ purulent sputum or mucus plugs or hemoptysis or cp or chest tightness, subjective wheeze or overt sinus or hb symptoms.   Sleeping  without nocturnal  or early am exacerbation  of respiratory  c/o's or need for noct saba. Also denies any obvious fluctuation of symptoms with weather or environmental changes or other aggravating or alleviating factors except as outlined above   No unusual exposure hx or h/o childhood pna/ asthma or knowledge of premature birth.  Current Allergies, Complete Past Medical History, Past Surgical History, Family History, and Social History were reviewed in [employer] record.  ROS  The following are not active complaints unless bolded Hoarseness, sore throat, dysphagia, dental problems, itching, sneezing,  nasal congestion or discharge of excess mucus or purulent secretions, ear ache,   fever, chills, sweats, unintended wt loss or wt gain, classically pleuritic or exertional cp,  orthopnea pnd or arm/hand swelling  or leg swelling, presyncope, palpitations, abdominal pain, anorexia, nausea, vomiting, diarrhea  or change in bowel habits or change in bladder habits, change in stools or change in urine, dysuria, hematuria,  rash,  arthralgias, visual complaints, headache, numbness, weakness or ataxia or problems with walking or coordination,  change in mood or  memory.        Current Meds  Medication Sig   albuterol (VENTOLIN HFA) 108 (90 Base) MCG/ACT inhaler Inhale 2 puffs into the lungs every 6 (six) hours as needed for wheezing or shortness of breath.   aspirin EC 81 MG tablet Take 81 mg by mouth at bedtime.    carvedilol (COREG) 12.5 MG tablet Take 12.5 mg by mouth 2 (two) times daily with a meal. Take 1/2 tablet by 2 times daily   cloNIDine (CATAPRES) 0.3 MG tablet Take 0.15 mg by mouth 2 (two) times daily.   diltiazem (CARDIZEM CD) 180 MG 24 hr capsule Take 180 mg by mouth daily.    doxylamine, Sleep, (SLEEP AID) 25  MG tablet Take 25 mg by mouth at bedtime as needed.   furosemide (LASIX) 40 MG tablet Take 1.5 tablets (60 mg total) by mouth daily.   hydroxyurea (HYDREA) 500 MG capsule Take 2 tablets Monday through Friday; take 1 tablet on Saturday and Sunday.  May take with food to minimize GI side effects.   ibuprofen (ADVIL) 800 MG tablet Take 800 mg by mouth every 6 (six) hours as needed.   isosorbide mononitrate (IMDUR) 30 MG 24 hr tablet Take 15 mg by mouth daily.   letrozole (FEMARA) 2.5 MG tablet Take 1 tablet by mouth once daily   losartan (COZAAR) 100 MG tablet Take 100 mg by mouth every evening.    Multiple Vitamins-Minerals (PRESERVISION AREDS PO) Take by mouth.   Simethicone (GAS RELIEF PO) Take 1 tablet by mouth daily.   simvastatin (ZOCOR) 10 MG tablet Take 10 mg by mouth every evening.    Vitamin D, Ergocalciferol, (DRISDOL) 1.25 MG (50000 UNIT) CAPS capsule Take 1 capsule (50,000 Units total) by mouth once a week.             Past Medical History:  Diagnosis Date   Arthritis    Chronic diastolic heart failure ([place])    Essential hypertension    GERD (gastroesophageal reflux disease)    Gout    Hiatal hernia    History of endometrial cancer    Sp hysterectomy 2012   Ischemic colitis ([place]) 2016   Myeloproliferative disorder, JAK-2 positive    Obesity    Pneumonia    Polycythemia vera(238.4)    TIA (transient ischemic attack)        Objective:     Wt Readings from Last 3 Encounters:  06/26/22 240 lb 3.2 oz (109 kg)  05/23/22 244 lb (110.7 kg)  05/18/22 238 lb 6.4 oz (108.1 kg)      Vital signs reviewed  06/26/2022  - Note at rest 02 sats  92% on 2lpm    General appearance:   morbidly obese (by BMI) wf amb with rollator      HEENT : Oropharynx  clear      Nasal turbinates nl    NECK :  without  apparent JVD/ palpable Nodes/TM    LUNGS: no acc muscle use,  Nl contour chest which is clear to A and P bilaterally without cough on insp or exp maneuvers   CV:  RRR   no s3  3/6 SEM s increase in P2, and 1+ ptting knees down bilaterally   ABD:  soft and nontender with nl inspiratory excursion in the supine position. No bruits or organomegaly appreciated   MS:  Nl gait/ ext  warm without deformities Or obvious joint restrictions  calf tenderness, cyanosis or clubbing    SKIN: warm and dry without lesions    NEURO:  alert, approp, nl sensorium with  no motor or cerebellar deficits apparent.               Assessment

## 2022-06-26 NOTE — Patient Instructions (Signed)
Add pepcid 20 mg with evening pills   Add 02 3lpm at bedtime and during the day as much as you can with goal of keeping saturations above 90% at all time   Please schedule a follow up visit in 3 months but call sooner if needed

## 2022-06-26 NOTE — Progress Notes (Signed)
PFT done today. 

## 2022-06-27 ENCOUNTER — Encounter: Payer: Self-pay | Admitting: Internal Medicine

## 2022-06-27 NOTE — Assessment & Plan Note (Addendum)
Onset 2012 p abd hysterectomy at wt 272  - Echo  09/13/21 1. Left ventricular ejection fraction, by estimation, is 65 to 70%. The  left ventricle has normal function. The left ventricle has no regional  wall motion abnormalities. There is moderate left ventricular hypertrophy.  Left ventricular diastolic  parameters are indeterminate.  2. Right ventricular systolic function is normal. The right ventricular  size is normal. There is severely elevated pulmonary artery systolic  pressure.  3. Left atrial size was severely dilated.  4. The mitral valve is abnormal. No evidence of mitral valve  regurgitation. Mild to moderate mitral stenosis. Moderate mitral annular  calcification.  5. The tricuspid valve is abnormal.  6. The aortic valve is tricuspid. There is moderate calcification of the  aortic valve. There is moderate thickening of the aortic valve. Aortic  valve regurgitation is not visualized. Mild aortic valve stenosis. Aortic  valve mean gradient measures 10.0  mmHg. Aortic valve peak gradient measures 22.7 mmHg. Aortic valve area, by  VTI measures 2.11 cm  7. The inferior vena cava is dilated in size with >50% respiratory  variability, suggesting right atrial pressure of 8 mmHg.  8.Right [place]: Right atrial size was normal in size -  PFT's  03/23/11   FEV1 1.31 (61 % ) ratio 0.71  p 0 % improvement from saba p ? prior to study with DLCO  19.4 (73% %)  and FV curve min concave and ERV  370 cc at wt 270   - PFT's  06/26/2022   FEV1 1.11(62 % ) ratio 0.88  p 0 % improvement from saba p nothing prior to study with DLCO  13.73 (76%)  and FV curve nl  and ERV 150 (44%)  at wt 240  Main finding are related to morbid obesity and increased L heart pressures though clearly dyspnea is multifactorial but nothing immediate to offer other than 02 at this pont  based on above findings and has planned f/u in Oct with Dr Domenic Polite already planned and may need RHC to sort out relevance of Mitral  stenosis ?

## 2022-06-27 NOTE — Assessment & Plan Note (Signed)
Started on 02 post op in 2012  - 05/23/2022   Walked on 3lpm  x  3  lap(s) =  approx 450  ft  @ slow/ rollator  pace, stopped due to end of study with lowest 02 sats 90%   - ONO on 2lpm 05/31/22  desat < 89% x 6 h so rec 3lpm and repeat on 3lpm 06/06/2022 >>> pt refused  So ok to leave on 2lpm for now - 06/17/22  ono on 3lpm  Only 7 min desats > no change rx  rec 3lpm hs (not prn)  And adjust daytime to keep sats > 90% at all times

## 2022-06-27 NOTE — Assessment & Plan Note (Signed)
See HRCT  06/19/22   - prob NSIP /  also large HH and  PH   NSIP is typically a more benign form of NSIP and based on her pfts is not much of a factor in her symptomatology at this point .  Best way to follow is serial sats with exertion and max rx for GERD based on:     improved survival time and with decreased radiologic fibrosis trends  per King's study published in [facility] vol 184 p1390.  Dec 2011 and also may have other beneficial effects as per the latest review in [place] vol 193 I7579 Jun 20016.  F/u q 3 m sooner if needed         Each maintenance medication was reviewed in detail including emphasizing most importantly the difference between maintenance and prns and under what circumstances the prns are to be triggered using an action plan format where appropriate.  Total time for H and P, chart review, counseling, reviewing 02  device(s) and generating customized AVS unique to this office visit / same day charting = 30 min summary ov.

## 2022-06-29 ENCOUNTER — Encounter (INDEPENDENT_AMBULATORY_CARE_PROVIDER_SITE_OTHER): Payer: Medicare Other | Admitting: Ophthalmology

## 2022-07-03 ENCOUNTER — Inpatient Hospital Stay: Payer: Medicare Other | Attending: Hematology

## 2022-07-03 DIAGNOSIS — D473 Essential (hemorrhagic) thrombocythemia: Secondary | ICD-10-CM | POA: Insufficient documentation

## 2022-07-03 DIAGNOSIS — Z170 Estrogen receptor positive status [ER+]: Secondary | ICD-10-CM | POA: Insufficient documentation

## 2022-07-03 DIAGNOSIS — C50412 Malignant neoplasm of upper-outer quadrant of left female breast: Secondary | ICD-10-CM | POA: Diagnosis not present

## 2022-07-03 LAB — CBC WITH DIFFERENTIAL/PLATELET
Abs Immature Granulocytes: 0.02 10*3/uL (ref 0.00–0.07)
Basophils Absolute: 0.1 10*3/uL (ref 0.0–0.1)
Basophils Relative: 2 %
Eosinophils Absolute: 0.2 10*3/uL (ref 0.0–0.5)
Eosinophils Relative: 4 %
HCT: 45.5 % (ref 36.0–46.0)
Hemoglobin: 15.1 g/dL — ABNORMAL HIGH (ref 12.0–15.0)
Immature Granulocytes: 0 %
Lymphocytes Relative: 25 %
Lymphs Abs: 1.7 10*3/uL (ref 0.7–4.0)
MCH: 33.7 pg (ref 26.0–34.0)
MCHC: 33.2 g/dL (ref 30.0–36.0)
MCV: 101.6 fL — ABNORMAL HIGH (ref 80.0–100.0)
Monocytes Absolute: 0.6 10*3/uL (ref 0.1–1.0)
Monocytes Relative: 10 %
Neutro Abs: 4.1 10*3/uL (ref 1.7–7.7)
Neutrophils Relative %: 59 %
Platelets: 354 10*3/uL (ref 150–400)
RBC: 4.48 MIL/uL (ref 3.87–5.11)
RDW: 17 % — ABNORMAL HIGH (ref 11.5–15.5)
WBC: 6.8 10*3/uL (ref 4.0–10.5)
nRBC: 0 % (ref 0.0–0.2)

## 2022-07-04 NOTE — Progress Notes (Signed)
Patient called.  Patient aware of instructions given by Tarri Abernethy.  Verbalized understanding.

## 2022-07-06 ENCOUNTER — Encounter (INDEPENDENT_AMBULATORY_CARE_PROVIDER_SITE_OTHER): Payer: Self-pay | Admitting: Ophthalmology

## 2022-07-06 ENCOUNTER — Ambulatory Visit (INDEPENDENT_AMBULATORY_CARE_PROVIDER_SITE_OTHER): Payer: Medicare Other | Admitting: Ophthalmology

## 2022-07-06 DIAGNOSIS — H353221 Exudative age-related macular degeneration, left eye, with active choroidal neovascularization: Secondary | ICD-10-CM

## 2022-07-06 DIAGNOSIS — H35722 Serous detachment of retinal pigment epithelium, left eye: Secondary | ICD-10-CM | POA: Diagnosis not present

## 2022-07-06 DIAGNOSIS — H43811 Vitreous degeneration, right eye: Secondary | ICD-10-CM | POA: Diagnosis not present

## 2022-07-06 DIAGNOSIS — H353212 Exudative age-related macular degeneration, right eye, with inactive choroidal neovascularization: Secondary | ICD-10-CM | POA: Diagnosis not present

## 2022-07-06 MED ORDER — BEVACIZUMAB CHEMO INJECTION 1.25MG/0.05ML SYRINGE FOR KALEIDOSCOPE
1.2500 mg | INTRAVITREAL | Status: AC | PRN
  Administered 2022-07-06: 1.25 mg via INTRAVITREAL

## 2022-07-06 NOTE — Assessment & Plan Note (Signed)
The nature of wet macular degeneration was discussed with the patient.  Forms of therapy reviewed include the use of Anti-VEGF medications injected painlessly into the eye, as well as other possible treatment modalities, including thermal laser therapy. Fellow eye involvement and risks were discussed with the patient. Upon the finding of wet age related macular degeneration, treatment will be offered. The treatment regimen is on a treat as needed basis with the intent to treat if necessary and extend interval of exams when possible. On average 1 out of 6 patients do not need lifetime therapy. However, the risk of recurrent disease is high for a lifetime.  Initially monthly, then periodic, examinations and evaluations will determine whether the next treatment is required on the day of the examination.  OS improved overall and stable at 8 weeks post injection Avastin we will repeat injection today and extend interval next to 9 to 10 weeks

## 2022-07-06 NOTE — Assessment & Plan Note (Signed)
Physiologic and stable

## 2022-07-06 NOTE — Assessment & Plan Note (Signed)
No sign of recurrent CNVM OD

## 2022-07-06 NOTE — Assessment & Plan Note (Signed)
no recurrence of hemorrhagic cyst RPE detachment

## 2022-07-06 NOTE — Progress Notes (Signed)
07/06/2022     CHIEF COMPLAINT Patient presents for  Chief Complaint  Patient presents with   Macular Degeneration      HISTORY OF PRESENT ILLNESS: [name] is a 81 y.o. female who presents to the clinic today for:   HPI   7 week fu os oct avastin  Pt states her vision has been stable Pt denies any new floaters or FOL Last edited by Morene Rankins, CMA on 07/06/2022  3:08 PM.      Referring physician: [name], MD [address] [place],  [place] [address]  HISTORICAL INFORMATION:   Selected notes from the [employer]: No current outpatient medications on file. (Ophthalmic Drugs)   No current facility-administered medications for this visit. (Ophthalmic Drugs)   Current Outpatient Medications (Other)  Medication Sig   albuterol (VENTOLIN HFA) 108 (90 Base) MCG/ACT inhaler Inhale 2 puffs into the lungs every 6 (six) hours as needed for wheezing or shortness of breath.   aspirin EC 81 MG tablet Take 81 mg by mouth at bedtime.    carvedilol (COREG) 12.5 MG tablet Take 12.5 mg by mouth 2 (two) times daily with a meal. Take 1/2 tablet by 2 times daily   chlorhexidine (PERIDEX) 0.12 % solution SMARTSIG:15 Milliliter(s) By Mouth 2-3 Times Daily PRN (Patient not taking: Reported on 06/26/2022)   cloNIDine (CATAPRES) 0.3 MG tablet Take 0.15 mg by mouth 2 (two) times daily.   diltiazem (CARDIZEM CD) 180 MG 24 hr capsule Take 180 mg by mouth daily.    doxylamine, Sleep, (SLEEP AID) 25 MG tablet Take 25 mg by mouth at bedtime as needed.   esomeprazole (NEXIUM) 40 MG capsule Take 1 capsule (40 mg total) by mouth every morning.   furosemide (LASIX) 40 MG tablet Take 1.5 tablets (60 mg total) by mouth daily.   hydroxyurea (HYDREA) 500 MG capsule Take 2 tablets Monday through Friday; take 1 tablet on Saturday and Sunday.  May take with food to minimize GI side effects.   ibuprofen (ADVIL) 800 MG tablet Take 800 mg by mouth every 6  (six) hours as needed.   isosorbide mononitrate (IMDUR) 30 MG 24 hr tablet Take 15 mg by mouth daily.   letrozole (FEMARA) 2.5 MG tablet Take 1 tablet by mouth once daily   losartan (COZAAR) 100 MG tablet Take 100 mg by mouth every evening.    Multiple Vitamins-Minerals (PRESERVISION AREDS PO) Take by mouth.   potassium chloride (KLOR-CON) 10 MEQ tablet Take 1 tablet (10 mEq total) by mouth daily.   Simethicone (GAS RELIEF PO) Take 1 tablet by mouth daily.   simvastatin (ZOCOR) 10 MG tablet Take 10 mg by mouth every evening.    Vitamin D, Ergocalciferol, (DRISDOL) 1.25 MG (50000 UNIT) CAPS capsule Take 1 capsule (50,000 Units total) by mouth once a week.   No current facility-administered medications for this visit. (Other)      REVIEW OF SYSTEMS: ROS   Negative for: Constitutional, Gastrointestinal, Neurological, Skin, Genitourinary, Musculoskeletal, HENT, Endocrine, Cardiovascular, Eyes, Respiratory, Psychiatric, Allergic/Imm, Heme/Lymph Last edited by Morene Rankins, CMA on 07/06/2022  3:08 PM.       ALLERGIES Allergies  Allergen Reactions   Carbamazepine Hives and Other (See Comments)    headache   Sulfa Antibiotics     Rash    PAST MEDICAL HISTORY Past Medical History:  Diagnosis Date   Arthritis    Chronic diastolic heart failure ([place])  Essential hypertension    GERD (gastroesophageal reflux disease)    Gout    Hiatal hernia    History of endometrial cancer    Sp hysterectomy 2012   Ischemic colitis ([place]) 2016   Myeloproliferative disorder, JAK-2 positive    Obesity    Pneumonia    Polycythemia vera(238.4)    TIA (transient ischemic attack)    Past Surgical History:  Procedure Laterality Date   ABDOMINAL HYSTERECTOMY  09/12/2011   Procedure: HYSTERECTOMY ABDOMINAL;  Surgeon: Janie Morning, MD;  [facility]: [facility];  Service: Gynecology;  Laterality: N/A;  Total Abdominal Hysterectomy, Bilateral Salpingo Oophorectomy   BREAST LUMPECTOMY Left 07/10/2017    LEFT BREAST LUMPECTOMY WITH RADIOACTIVE SEED AND SENTINEL LYMPH NODE BIOPSY    BREAST LUMPECTOMY WITH RADIOACTIVE SEED AND SENTINEL LYMPH NODE BIOPSY Left 07/10/2017   Procedure: LEFT BREAST LUMPECTOMY WITH RADIOACTIVE SEED AND SENTINEL LYMPH NODE BIOPSY;  Surgeon: Rolm Bookbinder, MD;  Location: [place];  Service: General;  Laterality: Left;   COLONOSCOPY  07/05/2012   Procedure: COLONOSCOPY;  Surgeon: Rogene Houston, MD;  [facility];  Service: Endoscopy;  Laterality: N/A;  730   EYE SURGERY     bilateral 8 - 12 yrs ago   FLEXIBLE SIGMOIDOSCOPY N/A 04/03/2015   Procedure: FLEXIBLE SIGMOIDOSCOPY;  Surgeon: Rogene Houston, MD;  [facility];  Service: Endoscopy;  Laterality: N/A;   HYSTEROSCOPY WITH D & C  08/15/2011   Procedure: DILATATION AND CURETTAGE (D&C) /HYSTEROSCOPY;  Surgeon: Jonnie Kind, MD;  [facility];  Service: Gynecology;  Laterality: N/A;  With Suction Curette   JOINT REPLACEMENT     left knee replaced 2014   KNEE ARTHROPLASTY Left 03/15/2015   Procedure: COMPUTER ASSISTED TOTAL KNEE ARTHROPLASTY;  Surgeon: Marybelle Killings, MD;  Location: [place];  Service: Orthopedics;  Laterality: Left;   SALPINGOOPHORECTOMY  09/12/2011   Procedure: SALPINGO OOPHERECTOMY;  Surgeon: Janie Morning, MD;  [facility];  Service: Gynecology;  Laterality: Bilateral;    FAMILY HISTORY Family History  Problem Relation Age of Onset   Hypertension Mother    Lung cancer Father    Arthritis/Rheumatoid Sister    Anesthesia problems Neg Hx    Hypotension Neg Hx    Malignant hyperthermia Neg Hx    Pseudochol deficiency Neg Hx     SOCIAL HISTORY Social History   Tobacco Use   Smoking status: Never   Smokeless tobacco: Never  Vaping Use   Vaping Use: Never used  Substance Use Topics   Alcohol use: No   Drug use: No         OPHTHALMIC EXAM:  Base Eye Exam     Visual Acuity (ETDRS)       Right Left   Dist [place] 20/20 20/20         Tonometry (Tonopen,  3:11 PM)       Right Left   Pressure 11 15         Pupils       Pupils   Right PERRL   Left PERRL         Visual Fields       Left Right    Full Full         Extraocular Movement       Right Left    Ortho Ortho    -- -- --  --  --  -- -- --   -- -- --  --  --  -- -- --  Neuro/Psych     Oriented x3: Yes   Mood/Affect: Normal         Dilation     Left eye: 2.5% Phenylephrine, 1.0% Mydriacyl @ 3:10 PM           Slit Lamp and Fundus Exam     External Exam       Right Left   External Normal Normal         Slit Lamp Exam       Right Left   Lids/Lashes Normal Normal   Conjunctiva/Sclera White and quiet White and quiet   Cornea Clear Clear   Anterior Chamber Deep and quiet Deep and quiet   Iris Round and reactive Round and reactive   Lens Posterior chamber intraocular lens Posterior chamber intraocular lens   Anterior Vitreous Normal Normal         Fundus Exam       Right Left   Posterior Vitreous Posterior vitreous detachment Posterior vitreous detachment   Disc Normal.  Normal   C/D Ratio 0.15 0.25   Macula Hard drusen, no hemorrhage, no macular thickening no macular thickening clinically, , Retinal pigment epithelial mottling, Hard drusen, no exudates, Pigmented atrophy   Vessels Normal Normal   Periphery Subretinal hemorrhage, along the superotemporal arcade Normal            IMAGING AND PROCEDURES  Imaging and Procedures for 07/06/22  OCT, Retina - OU - Both Eyes       Right Eye Quality was good. Scan locations included subfoveal. Central Foveal Thickness: 259. Progression has been stable. Findings include no IRF, no SRF.   Left Eye Quality was good. Scan locations included subfoveal. Central Foveal Thickness: 265. Progression has been stable. Findings include no IRF, abnormal foveal contour, subretinal fluid.   Notes OS today at  8 weeks post injection we planned for repeat injection today,  improved  residual subretinal fluid today at this interval, 8 weeks.  Repeat injection today and maintain interval examination OS next 9 weeks   OD today now over 1 year post last injection stable no sign of CNVM     Intravitreal Injection, Pharmacologic Agent - OS - Left Eye       Time Out 07/06/2022. 3:41 PM. Confirmed correct patient, procedure, site, and patient consented.   Anesthesia Topical anesthesia was used. Anesthetic medications included Lidocaine 4%.   Procedure Preparation included 5% betadine to ocular surface, 10% betadine to eyelids, Tobramycin 0.3%. A 30 gauge needle was used.   Injection: 1.25 mg Bevacizumab 1.'25mg'$ /0.3m   Route: Intravitreal, Site: Left Eye   NDC: [id number] Lot: [id number] Expiration date: 09/20/2022   Post-op Post injection exam found visual acuity of at least counting fingers. The patient tolerated the procedure well. There were no complications. The patient received written and verbal post procedure care education. Post injection medications included ocuflox.              ASSESSMENT/PLAN:  Exudative age-related macular degeneration of left eye with active choroidal neovascularization (HCC) The nature of wet macular degeneration was discussed with the patient.  Forms of therapy reviewed include the use of Anti-VEGF medications injected painlessly into the eye, as well as other possible treatment modalities, including thermal laser therapy. Fellow eye involvement and risks were discussed with the patient. Upon the finding of wet age related macular degeneration, treatment will be offered. The treatment regimen is on a treat as needed basis with the intent to treat if necessary and extend interval  of exams when possible. On average 1 out of 6 patients do not need lifetime therapy. However, the risk of recurrent disease is high for a lifetime.  Initially monthly, then periodic, examinations and evaluations will determine whether the next treatment is  required on the day of the examination.  OS improved overall and stable at 8 weeks post injection Avastin we will repeat injection today and extend interval next to 9 to 10 weeks  Exudative age-related macular degeneration of right eye with inactive choroidal neovascularization (HCC) No sign of recurrent CNVM OD  Posterior vitreous detachment of right eye Physiologic and stable  Serous detachment of retinal pigment epithelium of left eye  no recurrence of hemorrhagic cyst RPE detachment     ICD-10-CM   1. Exudative age-related macular degeneration of left eye with active choroidal neovascularization (HCC)  H35.3221 OCT, Retina - OU - Both Eyes    Intravitreal Injection, Pharmacologic Agent - OS - Left Eye    Bevacizumab (AVASTIN) SOLN 1.25 mg    2. Exudative age-related macular degeneration of right eye with inactive choroidal neovascularization ([place])  H35.3212     3. Posterior vitreous detachment of right eye  H43.811     4. Serous detachment of retinal pigment epithelium of left eye  H35.722       1.  OS doing very well.  No sign of recurrence of CNVM We will continue to monitor carefully.  At 8-week follow-up.  Pete Avastin OS today  2.  3.  Ophthalmic Meds Ordered this visit:  Meds ordered this encounter  Medications   Bevacizumab (AVASTIN) SOLN 1.25 mg       Return in about 10 weeks (around 09/14/2022) for DILATE OU, AVASTIN OCT, OS.  There are no Patient Instructions on file for this visit.   Explained the diagnoses, plan, and follow up with the patient and they expressed understanding.  Patient expressed understanding of the importance of proper follow up care.   Clent Demark Othel Hoogendoorn M.D. Diseases & Surgery of the Retina and Vitreous Retina & Diabetic [place] 07/06/22     Abbreviations: M myopia (nearsighted); A astigmatism; H hyperopia (farsighted); P presbyopia; Mrx spectacle prescription;  CTL contact lenses; OD right eye; OS left eye; OU both eyes  XT  exotropia; ET esotropia; PEK punctate epithelial keratitis; PEE punctate epithelial erosions; DES dry eye syndrome; MGD meibomian gland dysfunction; ATs artificial tears; PFAT's preservative free artificial tears; [place] nuclear sclerotic cataract; PSC posterior subcapsular cataract; ERM epi-retinal membrane; PVD posterior vitreous detachment; RD retinal detachment; DM diabetes mellitus; DR diabetic retinopathy; NPDR non-proliferative diabetic retinopathy; PDR proliferative diabetic retinopathy; CSME clinically significant macular edema; DME diabetic macular edema; dbh dot blot hemorrhages; CWS cotton wool spot; POAG primary open angle glaucoma; C/D cup-to-disc ratio; HVF humphrey visual field; GVF goldmann visual field; OCT optical coherence tomography; IOP intraocular pressure; BRVO Branch retinal vein occlusion; CRVO central retinal vein occlusion; CRAO central retinal artery occlusion; BRAO branch retinal artery occlusion; RT retinal tear; SB scleral buckle; PPV pars plana vitrectomy; VH Vitreous hemorrhage; PRP panretinal laser photocoagulation; IVK intravitreal kenalog; VMT vitreomacular traction; MH Macular hole;  NVD neovascularization of the disc; NVE neovascularization elsewhere; AREDS age related eye disease study; ARMD age related macular degeneration; POAG primary open angle glaucoma; EBMD epithelial/anterior basement membrane dystrophy; ACIOL anterior chamber intraocular lens; IOL intraocular lens; PCIOL posterior chamber intraocular lens; Phaco/IOL phacoemulsification with intraocular lens placement; [place] photorefractive keratectomy; LASIK laser assisted in situ keratomileusis; HTN hypertension; DM diabetes mellitus; COPD chronic obstructive  pulmonary disease

## 2022-07-12 ENCOUNTER — Ambulatory Visit (INDEPENDENT_AMBULATORY_CARE_PROVIDER_SITE_OTHER): Payer: Medicare Other

## 2022-07-12 ENCOUNTER — Ambulatory Visit: Payer: Medicare Other | Admitting: Orthopedic Surgery

## 2022-07-12 VITALS — BP 139/66 | HR 76 | Ht 63.0 in | Wt 236.0 lb

## 2022-07-12 DIAGNOSIS — R200 Anesthesia of skin: Secondary | ICD-10-CM

## 2022-07-12 DIAGNOSIS — R202 Paresthesia of skin: Secondary | ICD-10-CM | POA: Diagnosis not present

## 2022-07-12 DIAGNOSIS — M24131 Other articular cartilage disorders, right wrist: Secondary | ICD-10-CM

## 2022-07-12 DIAGNOSIS — G8929 Other chronic pain: Secondary | ICD-10-CM

## 2022-07-12 DIAGNOSIS — M25511 Pain in right shoulder: Secondary | ICD-10-CM | POA: Diagnosis not present

## 2022-07-12 DIAGNOSIS — M65331 Trigger finger, right middle finger: Secondary | ICD-10-CM | POA: Diagnosis not present

## 2022-07-12 MED ORDER — METHYLPREDNISOLONE ACETATE 40 MG/ML IJ SUSP
40.0000 mg | Freq: Once | INTRAMUSCULAR | Status: AC
  Administered 2022-07-12: 40 mg via INTRA_ARTICULAR

## 2022-07-12 NOTE — Progress Notes (Signed)
Chief Complaint  Patient presents with   Arm Pain    Right arm painful down from shoulder into hand and fingers / for about a couple months states no neck pain    Chief Complaint  Patient presents with   Arm Pain    Right arm painful down from shoulder into hand and fingers / for about a couple months states no neck pain     HPI: [name] is 81 years old she has been seen in the past for rotator cuff arthropathy of the right shoulder she comes in today complaining of right shoulder pain radiating to the right elbow and then she has a separate pain over the right wrist over the TFCC and then her long finger catches and locks occasionally  She has been seen before thought to be not suitable for surgical intervention in the right shoulder    Past Medical History:  Diagnosis Date   Arthritis    Chronic diastolic heart failure ([place])    Essential hypertension    GERD (gastroesophageal reflux disease)    Gout    Hiatal hernia    History of endometrial cancer    Sp hysterectomy 2012   Ischemic colitis ([place]) 2016   Myeloproliferative disorder, JAK-2 positive    Obesity    Pneumonia    Polycythemia vera(238.4)    TIA (transient ischemic attack)     BP 139/66   Pulse 76   Ht '5\' 3"'$  (1.6 m)   Wt 236 lb (107 kg)   BMI 41.81 kg/m    General appearance: Well-developed well-nourished no gross deformities  Cardiovascular normal pulse and perfusion normal color without edema  Neurologically no sensation loss or deficits or pathologic reflexes  Psychological: Awake alert and oriented x3 mood and affect normal  Skin no lacerations or ulcerations no nodularity no palpable masses, no erythema or nodularity  Musculoskeletal:  Right shoulder she has pain with resisted abduction her passive flexion is 140 degrees with no pain she has a positive impingement sign no external rotation deficit  Right wrist tenderness over the TFCC normal rotation without subluxation  Right long finger  tenderness over the A1 pulley no catching or locking  Imaging C-spine and shoulder  Cervical spondylosis mild to moderate  Right shoulder rotator cuff induced proximal migration of the humerus   A/P  Encounter Diagnoses  Name Primary?   Chronic right shoulder pain Yes   Numbness and tingling of right arm    Degenerative tear of triangular fibrocartilage complex (TFCC) of right wrist    Trigger finger, right middle finger     Recommend right shoulder injection  [place] exercises  No treatment of the tenosynovitis of the right middle finger  Brace for 6 weeks right wrist   Procedure note the subacromial injection shoulder RIGHT    Verbal consent was obtained to inject the  RIGHT   Shoulder  Timeout was completed to confirm the injection site is a subacromial space of the  RIGHT  shoulder   Medication used Depo-Medrol 40 mg and lidocaine 1% 3 cc  Anesthesia was provided by ethyl chloride  The injection was performed in the RIGHT  posterior subacromial space. After pinning the skin with alcohol and anesthetized the skin with ethyl chloride the subacromial space was injected using a 20-gauge needle. There were no complications  Sterile dressing was applied.

## 2022-07-17 ENCOUNTER — Ambulatory Visit: Payer: Medicare Other | Admitting: Orthopedic Surgery

## 2022-07-17 ENCOUNTER — Ambulatory Visit (INDEPENDENT_AMBULATORY_CARE_PROVIDER_SITE_OTHER): Payer: Medicare Other

## 2022-07-17 DIAGNOSIS — M1711 Unilateral primary osteoarthritis, right knee: Secondary | ICD-10-CM

## 2022-07-17 DIAGNOSIS — G8929 Other chronic pain: Secondary | ICD-10-CM

## 2022-07-17 DIAGNOSIS — M25561 Pain in right knee: Secondary | ICD-10-CM | POA: Diagnosis not present

## 2022-07-17 MED ORDER — METHYLPREDNISOLONE ACETATE 40 MG/ML IJ SUSP
40.0000 mg | Freq: Once | INTRAMUSCULAR | Status: AC
  Administered 2022-07-17: 40 mg via INTRA_ARTICULAR

## 2022-07-17 MED ORDER — MELOXICAM 7.5 MG PO TABS: 7.5000 mg | ORAL_TABLET | ORAL | 5 refills | Status: DC

## 2022-07-17 NOTE — Patient Instructions (Signed)

## 2022-07-17 NOTE — Progress Notes (Signed)
Chief Complaint  Patient presents with   Knee Pain    Right, give me a fit over the weekend used aleve and biofreeze helped some    Patient started having pain Friday got worse Saturday no trauma.  Complains of anterior knee pain  Past Medical History:  Diagnosis Date   Arthritis    Chronic diastolic heart failure ([place])    Essential hypertension    GERD (gastroesophageal reflux disease)    Gout    Hiatal hernia    History of endometrial cancer    Sp hysterectomy 2012   Ischemic colitis ([place]) 2016   Myeloproliferative disorder, JAK-2 positive    Obesity    Pneumonia    Polycythemia vera(238.4)    TIA (transient ischemic attack)    Past Surgical History:  Procedure Laterality Date   ABDOMINAL HYSTERECTOMY  09/12/2011   Procedure: HYSTERECTOMY ABDOMINAL;  Surgeon: Janie Morning, MD;  [facility];  Service: Gynecology;  Laterality: N/A;  Total Abdominal Hysterectomy, Bilateral Salpingo Oophorectomy   BREAST LUMPECTOMY Left 07/10/2017   LEFT BREAST LUMPECTOMY WITH RADIOACTIVE SEED AND SENTINEL LYMPH NODE BIOPSY    BREAST LUMPECTOMY WITH RADIOACTIVE SEED AND SENTINEL LYMPH NODE BIOPSY Left 07/10/2017   Procedure: LEFT BREAST LUMPECTOMY WITH RADIOACTIVE SEED AND SENTINEL LYMPH NODE BIOPSY;  Surgeon: Rolm Bookbinder, MD;  Location: [place];  Service: General;  Laterality: Left;   COLONOSCOPY  07/05/2012   Procedure: COLONOSCOPY;  Surgeon: Rogene Houston, MD;  [facility];  Service: Endoscopy;  Laterality: N/A;  730   EYE SURGERY     bilateral 8 - 12 yrs ago   FLEXIBLE SIGMOIDOSCOPY N/A 04/03/2015   Procedure: FLEXIBLE SIGMOIDOSCOPY;  Surgeon: Rogene Houston, MD;  [facility];  Service: Endoscopy;  Laterality: N/A;   HYSTEROSCOPY WITH D & C  08/15/2011   Procedure: DILATATION AND CURETTAGE (D&C) /HYSTEROSCOPY;  Surgeon: Jonnie Kind, MD;  [facility];  Service: Gynecology;  Laterality: N/A;  With Suction Curette   JOINT REPLACEMENT     left knee  replaced 2014   KNEE ARTHROPLASTY Left 03/15/2015   Procedure: COMPUTER ASSISTED TOTAL KNEE ARTHROPLASTY;  Surgeon: Marybelle Killings, MD;  Location: [facility];  Service: Orthopedics;  Laterality: Left;   SALPINGOOPHORECTOMY  09/12/2011   Procedure: SALPINGO OOPHERECTOMY;  Surgeon: Janie Morning, MD;  [facility];  Service: Gynecology;  Laterality: Bilateral;   Examination reveals a patient who is 81 years old she is ambulatory with a walker  The right knee is tender on the anterolateral aspect including the anterior joint line and patellofemoral region.  Her range of motion is 3 to 110 degrees with stable ligaments.  Her active range of motion is intact including extension with good power  X-rays show valgus osteoarthritis of the knee  The patient is not a good candidate for surgery so we gave her an injection and put her on meloxicam    Xrays severe OA Gr 4 mild valgus   Rec inj and oral nsaid   Meds ordered this encounter  Medications   methylPREDNISolone acetate (DEPO-MEDROL) injection 40 mg   meloxicam (MOBIC) 7.5 MG tablet    Sig: Take 1 tablet (7.5 mg total) by mouth every other day.    Dispense:  30 tablet    Refill:  5

## 2022-07-22 ENCOUNTER — Other Ambulatory Visit: Payer: Self-pay | Admitting: Cardiology

## 2022-07-29 ENCOUNTER — Other Ambulatory Visit (HOSPITAL_COMMUNITY): Payer: Self-pay | Admitting: Hematology

## 2022-07-31 ENCOUNTER — Other Ambulatory Visit: Payer: Self-pay | Admitting: *Deleted

## 2022-07-31 NOTE — Telephone Encounter (Signed)
Letrozole refill approved.  Patient tolerating and is to continue therapy. 

## 2022-08-02 ENCOUNTER — Telehealth: Payer: Self-pay | Admitting: Orthopedic Surgery

## 2022-08-02 NOTE — Telephone Encounter (Signed)
[place] for me to advise her to call PCP for thighs cramping?

## 2022-08-02 NOTE — Telephone Encounter (Signed)
Call received from patient; has question about "cramps" in both thighs, which she is having occur at night. States had some cramping in the right thigh prior to the knee injection done on 07/17/22, but now is noticing more so. States she was prescribed Meloxicam at time of visit. Please advise.

## 2022-08-07 ENCOUNTER — Inpatient Hospital Stay: Payer: Medicare Other | Attending: Hematology

## 2022-08-07 DIAGNOSIS — R059 Cough, unspecified: Secondary | ICD-10-CM | POA: Diagnosis not present

## 2022-08-07 DIAGNOSIS — Z170 Estrogen receptor positive status [ER+]: Secondary | ICD-10-CM | POA: Diagnosis not present

## 2022-08-07 DIAGNOSIS — R0609 Other forms of dyspnea: Secondary | ICD-10-CM | POA: Diagnosis not present

## 2022-08-07 DIAGNOSIS — Z8261 Family history of arthritis: Secondary | ICD-10-CM | POA: Insufficient documentation

## 2022-08-07 DIAGNOSIS — C50412 Malignant neoplasm of upper-outer quadrant of left female breast: Secondary | ICD-10-CM | POA: Insufficient documentation

## 2022-08-07 DIAGNOSIS — R5383 Other fatigue: Secondary | ICD-10-CM | POA: Diagnosis not present

## 2022-08-07 DIAGNOSIS — R252 Cramp and spasm: Secondary | ICD-10-CM | POA: Diagnosis not present

## 2022-08-07 DIAGNOSIS — R519 Headache, unspecified: Secondary | ICD-10-CM | POA: Diagnosis not present

## 2022-08-07 DIAGNOSIS — D45 Polycythemia vera: Secondary | ICD-10-CM | POA: Insufficient documentation

## 2022-08-07 DIAGNOSIS — Z8673 Personal history of transient ischemic attack (TIA), and cerebral infarction without residual deficits: Secondary | ICD-10-CM | POA: Diagnosis not present

## 2022-08-07 DIAGNOSIS — Z801 Family history of malignant neoplasm of trachea, bronchus and lung: Secondary | ICD-10-CM | POA: Diagnosis not present

## 2022-08-07 DIAGNOSIS — Z90710 Acquired absence of both cervix and uterus: Secondary | ICD-10-CM | POA: Diagnosis not present

## 2022-08-07 DIAGNOSIS — Z90721 Acquired absence of ovaries, unilateral: Secondary | ICD-10-CM | POA: Insufficient documentation

## 2022-08-07 DIAGNOSIS — R0602 Shortness of breath: Secondary | ICD-10-CM | POA: Insufficient documentation

## 2022-08-07 DIAGNOSIS — D473 Essential (hemorrhagic) thrombocythemia: Secondary | ICD-10-CM | POA: Diagnosis not present

## 2022-08-07 DIAGNOSIS — Z882 Allergy status to sulfonamides status: Secondary | ICD-10-CM | POA: Diagnosis not present

## 2022-08-07 DIAGNOSIS — Z8249 Family history of ischemic heart disease and other diseases of the circulatory system: Secondary | ICD-10-CM | POA: Insufficient documentation

## 2022-08-07 DIAGNOSIS — Z8542 Personal history of malignant neoplasm of other parts of uterus: Secondary | ICD-10-CM | POA: Insufficient documentation

## 2022-08-07 DIAGNOSIS — D75839 Thrombocytosis, unspecified: Secondary | ICD-10-CM | POA: Insufficient documentation

## 2022-08-07 DIAGNOSIS — Z79899 Other long term (current) drug therapy: Secondary | ICD-10-CM | POA: Diagnosis not present

## 2022-08-07 DIAGNOSIS — Z79811 Long term (current) use of aromatase inhibitors: Secondary | ICD-10-CM | POA: Diagnosis not present

## 2022-08-07 DIAGNOSIS — E559 Vitamin D deficiency, unspecified: Secondary | ICD-10-CM

## 2022-08-07 DIAGNOSIS — R634 Abnormal weight loss: Secondary | ICD-10-CM | POA: Diagnosis not present

## 2022-08-07 LAB — CBC WITH DIFFERENTIAL/PLATELET
Abs Immature Granulocytes: 0.02 10*3/uL (ref 0.00–0.07)
Basophils Absolute: 0.1 10*3/uL (ref 0.0–0.1)
Basophils Relative: 1 %
Eosinophils Absolute: 0.3 10*3/uL (ref 0.0–0.5)
Eosinophils Relative: 4 %
HCT: 43.5 % (ref 36.0–46.0)
Hemoglobin: 14.6 g/dL (ref 12.0–15.0)
Immature Granulocytes: 0 %
Lymphocytes Relative: 20 %
Lymphs Abs: 1.4 10*3/uL (ref 0.7–4.0)
MCH: 34.4 pg — ABNORMAL HIGH (ref 26.0–34.0)
MCHC: 33.6 g/dL (ref 30.0–36.0)
MCV: 102.6 fL — ABNORMAL HIGH (ref 80.0–100.0)
Monocytes Absolute: 0.6 10*3/uL (ref 0.1–1.0)
Monocytes Relative: 8 %
Neutro Abs: 4.8 10*3/uL (ref 1.7–7.7)
Neutrophils Relative %: 67 %
Platelets: 448 10*3/uL — ABNORMAL HIGH (ref 150–400)
RBC: 4.24 MIL/uL (ref 3.87–5.11)
RDW: 17 % — ABNORMAL HIGH (ref 11.5–15.5)
WBC: 7.1 10*3/uL (ref 4.0–10.5)
nRBC: 0 % (ref 0.0–0.2)

## 2022-08-07 LAB — COMPREHENSIVE METABOLIC PANEL
ALT: 12 U/L (ref 0–44)
AST: 17 U/L (ref 15–41)
Albumin: 3.9 g/dL (ref 3.5–5.0)
Alkaline Phosphatase: 82 U/L (ref 38–126)
Anion gap: 5 (ref 5–15)
BUN: 20 mg/dL (ref 8–23)
CO2: 30 mmol/L (ref 22–32)
Calcium: 9.4 mg/dL (ref 8.9–10.3)
Chloride: 103 mmol/L (ref 98–111)
Creatinine, Ser: 0.5 mg/dL (ref 0.44–1.00)
GFR, Estimated: >60 mL/min (ref >60)
Glucose, Bld: 78 mg/dL (ref 70–99)
Potassium: 4 mmol/L (ref 3.5–5.1)
Sodium: 138 mmol/L (ref 135–145)
Total Bilirubin: 0.8 mg/dL (ref 0.3–1.2)
Total Protein: 7.1 g/dL (ref 6.5–8.1)

## 2022-08-07 LAB — VITAMIN D 25 HYDROXY (VIT D DEFICIENCY, FRACTURES): Vit D, 25-Hydroxy: 80.66 ng/mL (ref 30–100)

## 2022-08-07 LAB — LACTATE DEHYDROGENASE: LDH: 145 U/L (ref 98–192)

## 2022-08-13 NOTE — Progress Notes (Unsigned)
[name], [place] [address]   CLINIC:  Medical Oncology/Hematology  PCP:  [name], MD [address] / [place] [address] [phone]   REASON FOR VISIT:  Follow-up for left-sided breast cancer and JAK2 + polycythemia vera/essential thrombocytosis   PRIOR THERAPY: Left-sided lumpectomy with adjuvant XRT   CURRENT THERAPY: - Letrozole daily since January 2019 - Hydrea [current dose 2 tablets (1000 mg) M/W/F, 1 tablet (500 mg) T/T/S/S]  BRIEF ONCOLOGIC HISTORY:  Oncology History  Malignant neoplasm of upper-outer quadrant of left breast in female, estrogen receptor positive ([place])  07/10/2017 Surgery   Left lumpectomy: IDC grade 2, 1.3 cm, intermediate grade DCIS, margins negative, 1/2 lymph nodes positive, ER 5% positive weak staining, PR 0%, HER-2 negative ratio 1.73, Ki-67 15%, T1cN0 stage IA AJCC 8   07/10/2017 Miscellaneous   Mammaprint: Low risk, 10-year risk of recurrence untreated 10%   10/01/2017 - 11/14/2017 Radiation Therapy   Adj XRT   11/2017 -  Anti-estrogen oral therapy   Letrozole daily     CANCER STAGING:  Cancer Staging  Malignant neoplasm of upper-outer quadrant of left breast in female, estrogen receptor positive ([place]) Staging form: Breast, AJCC 8th Edition - Clinical stage from 05/22/2017: Stage IA (cT1, cN0, cM0, G1, ER+, PR-, HER2-) - Signed by Holley Bouche, NP on 06/22/2017 - Pathologic: Stage IIA (pT1c, pN1a, cM0, G2, ER+, PR-, HER2-) - Unsigned   INTERVAL HISTORY:  [name], a 81 y.o. female, returns for routine follow-up of her left-sided breast cancer and JAK2 + polycythemia vera/essential thrombocytosis. Cheyan was last seen on 05/18/2022 by Tarri Abernethy PA-C.   She had dental implant surgery on 07/11/2022.  She held [facility] for 2 days prior to surgery and resumed 1 week after surgery.  She reports that her mouth is healing well.  Overall, she is tolerating Hydrea well without  any skin sores, GI side effects, or worsening fatigue.    She reports that her energy level is stable.  She is having some worsening shortness of breath and intermittent episodes of hypoxia, and has been referred to pulmonology by her cardiologist.  She continues to take aspirin 81 mg daily.  She denies any aquagenic pruritus, Raynaud's, erythromelalgia. No vasomotor symptoms such as dizziness, tinnitus, intermittent blurry vision (she has chronic visual deficit from macular degeneration), strokelike symptoms, or neuropathy.  She denies any symptoms of recurrence such as new lumps, bone pain, chest pain, or abdominal pain. She has chronic dyspnea on exertion.  She has no new headaches, seizures, or focal neurologic deficits.   No B symptoms such as fever, chills, night sweats, unintentional weight loss.   She continues to take letrozole as prescribed, and is tolerating it well.  She does have leg cramps from time to time.  She denies any hot flashes, mood swings, or abnormal weight gain.   She reports 70% energy and 80% appetite.  She is maintaining stable weight at this time.   REVIEW OF SYSTEMS:    Review of Systems  Constitutional:  Positive for fatigue. Negative for appetite change, chills, diaphoresis, fever and unexpected weight change.  HENT:   Negative for lump/mass, mouth sores and nosebleeds.   Eyes:  Negative for eye problems.  Respiratory:  Positive for cough and shortness of breath (with exertion). Negative for hemoptysis.   Cardiovascular:  Negative for chest pain, leg swelling and palpitations.  Gastrointestinal:  Negative for abdominal pain, blood in stool, constipation, diarrhea,  nausea and vomiting.  Genitourinary:  Negative for hematuria.   Skin: Negative.   Neurological:  Positive for headaches. Negative for dizziness and light-headedness.  Hematological:  Does not bruise/bleed easily.  Psychiatric/Behavioral:  Positive for sleep disturbance.     PAST MEDICAL/SURGICAL  HISTORY:  Past Medical History:  Diagnosis Date   Arthritis    Chronic diastolic heart failure ([place])    Essential hypertension    GERD (gastroesophageal reflux disease)    Gout    Hiatal hernia    History of endometrial cancer    Sp hysterectomy 2012   Ischemic colitis ([place]) 2016   Myeloproliferative disorder, JAK-2 positive    Obesity    Pneumonia    Polycythemia vera(238.4)    TIA (transient ischemic attack)    Past Surgical History:  Procedure Laterality Date   ABDOMINAL HYSTERECTOMY  09/12/2011   Procedure: HYSTERECTOMY ABDOMINAL;  Surgeon: Janie Morning, MD;  Location: [facility];  Service: Gynecology;  Laterality: N/A;  Total Abdominal Hysterectomy, Bilateral Salpingo Oophorectomy   BREAST LUMPECTOMY Left 07/10/2017   LEFT BREAST LUMPECTOMY WITH RADIOACTIVE SEED AND SENTINEL LYMPH NODE BIOPSY    BREAST LUMPECTOMY WITH RADIOACTIVE SEED AND SENTINEL LYMPH NODE BIOPSY Left 07/10/2017   Procedure: LEFT BREAST LUMPECTOMY WITH RADIOACTIVE SEED AND SENTINEL LYMPH NODE BIOPSY;  Surgeon: Rolm Bookbinder, MD;  Location: [facility];  Service: General;  Laterality: Left;   COLONOSCOPY  07/05/2012   Procedure: COLONOSCOPY;  Surgeon: Rogene Houston, MD;  Location: [facility];  Service: Endoscopy;  Laterality: N/A;  730   EYE SURGERY     bilateral 8 - 12 yrs ago   FLEXIBLE SIGMOIDOSCOPY N/A 04/03/2015   Procedure: FLEXIBLE SIGMOIDOSCOPY;  Surgeon: Rogene Houston, MD;  [facility];  Service: Endoscopy;  Laterality: N/A;   HYSTEROSCOPY WITH D & C  08/15/2011   Procedure: DILATATION AND CURETTAGE (D&C) /HYSTEROSCOPY;  Surgeon: Jonnie Kind, MD;  Location: [facility];  Service: Gynecology;  Laterality: N/A;  With Suction Curette   JOINT REPLACEMENT     left knee replaced 2014   KNEE ARTHROPLASTY Left 03/15/2015   Procedure: COMPUTER ASSISTED TOTAL KNEE ARTHROPLASTY;  Surgeon: Marybelle Killings, MD;  Location: [facility];  Service: Orthopedics;  Laterality: Left;   SALPINGOOPHORECTOMY  09/12/2011    Procedure: SALPINGO OOPHERECTOMY;  Surgeon: Janie Morning, MD;  Location: [facility];  Service: Gynecology;  Laterality: Bilateral;    SOCIAL HISTORY:  Social History   Socioeconomic History   Marital status: Widowed    Spouse name: Not on file   Number of children: Not on file   Years of education: Not on file   Highest education level: Not on file  Occupational History   Not on file  Tobacco Use   Smoking status: Never   Smokeless tobacco: Never  Vaping Use   Vaping Use: Never used  Substance and Sexual Activity   Alcohol use: No   Drug use: No   Sexual activity: Never    Birth control/protection: Surgical    Comment: hysterectomy  Other Topics Concern   Not on file  Social History Narrative   Not on file   Social Determinants of Health   Financial Resource Strain: Low Risk  (01/19/2021)   Overall Financial Resource Strain (CARDIA)    Difficulty of Paying Living Expenses: Not hard at all  Food Insecurity: No Food Insecurity (01/19/2021)   Hunger Vital Sign    Worried About [place] in the Last Year: Never  true    Ran Out of Food in the Last Year: Never true  Transportation Needs: No Transportation Needs (01/19/2021)   PRAPARE - Hydrologist (Medical): No    Lack of Transportation (Non-Medical): No  Physical Activity: Insufficiently Active (01/19/2021)   Exercise Vital Sign    Days of Exercise per Week: 3 days    Minutes of Exercise per Session: 30 min  Stress: No Stress Concern Present (01/19/2021)   [place]    Feeling of Stress : Only a little  Social Connections: Moderately Integrated (01/19/2021)   Social Connection and Isolation Panel [NHANES]    Frequency of Communication with Friends and Family: More than three times a week    Frequency of Social Gatherings with Friends and Family: More than three times a week    Attends Religious Services: More than 4 times  per year    Active Member of [employer] or Organizations: No    Attends Archivist Meetings: Never    Marital Status: Married  Human resources officer Violence: Not At Risk (01/19/2021)   Humiliation, Afraid, Rape, and Kick questionnaire    Fear of Current or Ex-Partner: No    Emotionally Abused: No    Physically Abused: No    Sexually Abused: No    FAMILY HISTORY:  Family History  Problem Relation Age of Onset   Hypertension Mother    Lung cancer Father    Arthritis/Rheumatoid Sister    Anesthesia problems Neg Hx    Hypotension Neg Hx    Malignant hyperthermia Neg Hx    Pseudochol deficiency Neg Hx     CURRENT MEDICATIONS:  Current Outpatient Medications  Medication Sig Dispense Refill   aspirin EC 81 MG tablet Take 81 mg by mouth at bedtime.      carvedilol (COREG) 12.5 MG tablet Take 12.5 mg by mouth 2 (two) times daily with a meal. Take 1/2 tablet by 2 times daily     chlorhexidine (PERIDEX) 0.12 % solution      cloNIDine (CATAPRES) 0.3 MG tablet Take 0.15 mg by mouth 2 (two) times daily.     diltiazem (CARDIZEM CD) 180 MG 24 hr capsule Take 180 mg by mouth daily.      doxylamine, Sleep, (SLEEP AID) 25 MG tablet Take 25 mg by mouth at bedtime as needed.     esomeprazole (NEXIUM) 40 MG capsule Take 1 capsule (40 mg total) by mouth every morning. 30 capsule 11   furosemide (LASIX) 40 MG tablet Take 1.5 tablets (60 mg total) by mouth daily. 135 tablet 3   hydroxyurea (HYDREA) 500 MG capsule Take 2 tablets Monday through Friday; take 1 tablet on Saturday and Sunday.  May take with food to minimize GI side effects. 110 capsule 3   ibuprofen (ADVIL) 800 MG tablet Take 800 mg by mouth every 6 (six) hours as needed.     isosorbide mononitrate (IMDUR) 30 MG 24 hr tablet TAKE 1/2 (ONE-HALF) TABLET BY MOUTH AT BEDTIME 45 tablet 1   letrozole (FEMARA) 2.5 MG tablet Take 1 tablet by mouth once daily 90 tablet 0   losartan (COZAAR) 100 MG tablet Take 100 mg by mouth every evening.       meloxicam (MOBIC) 7.5 MG tablet Take 1 tablet (7.5 mg total) by mouth every other day. 30 tablet 5   Multiple Vitamins-Minerals (PRESERVISION AREDS PO) Take by mouth.     potassium chloride (KLOR-CON) 10 MEQ  tablet Take 1 tablet (10 mEq total) by mouth daily. 90 tablet 3   Simethicone (GAS RELIEF PO) Take 1 tablet by mouth daily.     simvastatin (ZOCOR) 10 MG tablet Take 10 mg by mouth every evening.      Vitamin D, Ergocalciferol, (DRISDOL) 1.25 MG (50000 UNIT) CAPS capsule Take 1 capsule (50,000 Units total) by mouth once a week. 12 capsule 0   No current facility-administered medications for this visit.    ALLERGIES:  Allergies  Allergen Reactions   Carbamazepine Hives and Other (See Comments)    headache   Sulfa Antibiotics     Rash    PHYSICAL EXAM:    Performance status (ECOG): 2 - Symptomatic, <50% confined to bed  There were no vitals filed for this visit. Wt Readings from Last 3 Encounters:  07/12/22 236 lb (107 kg)  06/26/22 240 lb 3.2 oz (109 kg)  05/23/22 244 lb (110.7 kg)   Physical Exam Constitutional:      Appearance: Normal appearance. She is obese.  HENT:     Head: Normocephalic and atraumatic.     Mouth/Throat:     Mouth: Mucous membranes are moist.  Eyes:     Extraocular Movements: Extraocular movements intact.     Pupils: Pupils are equal, round, and reactive to light.  Cardiovascular:     Rate and Rhythm: Normal rate and regular rhythm.     Pulses: Normal pulses.     Heart sounds: Murmur (coarse murmur over left upper sternal border) heard.  Pulmonary:     Effort: Pulmonary effort is normal.     Breath sounds: Normal breath sounds.  Chest:     Comments: Breast exam deferred today. Abdominal:     General: Bowel sounds are normal.     Palpations: Abdomen is soft.     Tenderness: There is no abdominal tenderness.  Musculoskeletal:        General: No swelling.     Right lower leg: Edema present.     Left lower leg: Edema (chronic-appearing  lymphedema of bilateral lower extremities with associated skin thickening) present.  Lymphadenopathy:     Cervical: No cervical adenopathy.  Skin:    General: Skin is warm and dry.  Neurological:     General: No focal deficit present.     Mental Status: She is alert and oriented to person, place, and time.  Psychiatric:        Mood and Affect: Mood normal.        Behavior: Behavior normal.     LABORATORY DATA:  I have reviewed the labs as listed.     Latest Ref Rng & Units 08/07/2022   12:50 PM 07/03/2022    1:15 PM 04/27/2022    1:15 PM  CBC  WBC 4.0 - 10.5 K/uL 7.1  6.8  8.1   Hemoglobin 12.0 - 15.0 g/dL 14.6  15.1  15.9   Hematocrit 36.0 - 46.0 % 43.5  45.5  49.2   Platelets 150 - 400 K/uL 448  354  520       Latest Ref Rng & Units 08/07/2022   12:50 PM 04/27/2022    1:15 PM 01/16/2022    1:09 PM  CMP  Glucose 70 - 99 mg/dL 78  98  102   BUN 8 - 23 mg/dL _0 Creatinine 0.44 - 1.00 mg/dL 0.50  0.54  0.63   Sodium 135 - 145 mmol/L 138  137  142  Potassium 3.5 - 5.1 mmol/L 4.0  4.2  4.3   Chloride 98 - 111 mmol/L 103  102  102   CO2 22 - 32 mmol/L 30  29  32   Calcium 8.9 - 10.3 mg/dL 9.4  9.4  9.4   Total Protein 6.5 - 8.1 g/dL 7.1  8.0  7.3   Total Bilirubin 0.3 - 1.2 mg/dL 0.8  0.6  0.9   Alkaline Phos 38 - 126 U/L 82  97  84   AST 15 - 41 U/L _0 ALT 0 - 44 U/L _1 DIAGNOSTIC IMAGING:  I have independently reviewed the scans and discussed with the patient. DG Knee AP/LAT W/[place] Right  Result Date: 07/17/2022 Imaging of the right knee Chronic right knee pain X-ray 3 views right knee. Patient has severe patellofemoral arthritis with joint space narrowing dominant lateral facet prominent lateral spurs severe narrowing of the patellofemoral joint posterior osteophytes are seen in the knee as well The knee is in valgus alignment the lateral compartment is obliterated the subchondral bone is sclerotic there are peripheral osteophytes there  as well Impression grade 4 arthritis of the right knee     ASSESSMENT & PLAN: 1.  JAK2 + essential thrombocytosis versus polycythemia vera - JAK2 V617F mutation detected in 2015 - No prior history of thrombosis - Hydroxyurea was started in November 2019 - Current dose of hydroxyurea 2 tablets (1000 mg) M/W/F, 1 tablet (500 mg) T/T/S/S - Since her last visit, she held Hydrea x1 week after dental implant surgery on 07/11/2022.   - No vasomotor symptoms, erythromelalgia, or aquagenic pruritus     - She is taking aspirin 81 mg daily. - Most recent CBC (08/07/2022): Platelets 448, Hgb 14.6/hematocrit 43.5%, WBC 7.1.  Normal LDH and CMP. - Platelets are the primarily affected cell line, but she does have intermittent elevations in Hgb/HCT, which could indicate polycythemia vera rather than essential thrombocytosis.  No change in management or monitoring strategy.  Goal remains platelets <400 and HCT <45.0%. - PLAN: We will continue her on Hydrea 1000 mg M/W/F and 500 mg T/T/S/S. - Repeat labs and RTC in 3 months (labs toi be obtained at [facility] per patient request) - Continue aspirin 81 mg daily - Patient is aware of alarm symptoms that would prompt more immediate medical attention.  2.  T1CN1A (stage IIa) malignant neoplasm of upper-outer quadrant of left breast - Left breast biopsy on 05/29/2017 of the upper outer quadrant mass - IDC, grade 1/2.  ER 5%, PR 0%, Ki-67 15%, HER-2 negative. - Left breast lumpectomy on 07/10/2017, IDC, grade 2, 1.3 cm, margins negative, 1/2 lymph nodes positive, pT1c, PN 1A - MammaPrint-low risk luminal A, average 10-year risk of recurrence untreated 10%. - Adjuvant XRT from 10/01/2017-11/15/2017. - Letrozole 1 mg daily for 5 to 7 years started on 11/15/2017. - She is tolerating letrozole without any major hot flashes.     - She reports some cramping in her legs on and off.     - Reviewed mammogram from 05/25/2022, BI-RADS Category 1, negative.  No mammographic evidence of  malignancy in either breast. - Patient deferred breast exam today - states that she is seeing PCP next week and that he will do breast exam - ROS did not reveal any red flag symptoms of recurrence    - Most recent labs show baseline CBC and normal CMP - PLAN: Continue letrozole.   --  We will check BCI, as patient will have completed 5 years of therapy in January 2024 -- Next mammogram due July 2024. - Breast exam was deferred today per patient request, states that her PCP is going to check it later this month.  However, she would like her breast exam to be performed at our clinic at her follow-up visit in March/April 2024.   3.  Bone health - Bone density scan (05/24/2021): T score -1.0, normal - Most recent vitamin D (08/07/2022) normal at 80.66 - PLAN: In the setting of aromatase inhibitor use, we will continue to check bone density scan every 2 to 3 years.  4.  Weight loss     - According to weight (204 pounds) obtained on 08/14/2022, patient had lost 32 pounds in the 4 weeks.  There is some question as to the accuracy of this, perhaps some discrepancy between different scales. - Review of past weights shows significant fluctuations and possible discrepancies - Some of her weight fluctuations may have been fluid weight, as the patient recently had her dose of Lasix increased. - She reports that her clothes are fitting the same at home. - She reports baseline activity level and food intake. - PLAN: No additional follow-up needed at present. - We will continue to monitor weight at follow-up visits.  5.  Social/family history: - She was recently widowed in February 2022.  She is living independently.  She works part-time for 6 hours a day, office job.  She is non-smoker. - Father had lung cancer.   All questions were answered. The patient knows to call the clinic with any problems, questions or concerns.  Medical decision making: Moderate      Time spent on visit: I spent 20 minutes  counseling the patient face to face. The total time spent in the appointment was 30 minutes and more than 50% was on counseling.   Harriett Rush, PA-C  08/14/22 5:17 PM

## 2022-08-14 ENCOUNTER — Inpatient Hospital Stay: Payer: Medicare Other | Admitting: Physician Assistant

## 2022-08-14 VITALS — BP 165/73 | HR 82 | Temp 98.1°F | Resp 18 | Ht 64.0 in | Wt 204.6 lb

## 2022-08-14 DIAGNOSIS — Z8673 Personal history of transient ischemic attack (TIA), and cerebral infarction without residual deficits: Secondary | ICD-10-CM | POA: Diagnosis not present

## 2022-08-14 DIAGNOSIS — R059 Cough, unspecified: Secondary | ICD-10-CM | POA: Diagnosis not present

## 2022-08-14 DIAGNOSIS — D45 Polycythemia vera: Secondary | ICD-10-CM | POA: Diagnosis not present

## 2022-08-14 DIAGNOSIS — C50412 Malignant neoplasm of upper-outer quadrant of left female breast: Secondary | ICD-10-CM

## 2022-08-14 DIAGNOSIS — D473 Essential (hemorrhagic) thrombocythemia: Secondary | ICD-10-CM

## 2022-08-14 DIAGNOSIS — E559 Vitamin D deficiency, unspecified: Secondary | ICD-10-CM | POA: Diagnosis not present

## 2022-08-14 DIAGNOSIS — R634 Abnormal weight loss: Secondary | ICD-10-CM | POA: Diagnosis not present

## 2022-08-14 DIAGNOSIS — Z170 Estrogen receptor positive status [ER+]: Secondary | ICD-10-CM | POA: Diagnosis not present

## 2022-08-14 DIAGNOSIS — Z79811 Long term (current) use of aromatase inhibitors: Secondary | ICD-10-CM | POA: Diagnosis not present

## 2022-08-14 DIAGNOSIS — R5383 Other fatigue: Secondary | ICD-10-CM | POA: Diagnosis not present

## 2022-08-14 DIAGNOSIS — Z882 Allergy status to sulfonamides status: Secondary | ICD-10-CM | POA: Diagnosis not present

## 2022-08-14 DIAGNOSIS — R0602 Shortness of breath: Secondary | ICD-10-CM | POA: Diagnosis not present

## 2022-08-14 DIAGNOSIS — Z79899 Other long term (current) drug therapy: Secondary | ICD-10-CM | POA: Diagnosis not present

## 2022-08-14 DIAGNOSIS — Z8249 Family history of ischemic heart disease and other diseases of the circulatory system: Secondary | ICD-10-CM | POA: Diagnosis not present

## 2022-08-14 DIAGNOSIS — R519 Headache, unspecified: Secondary | ICD-10-CM | POA: Diagnosis not present

## 2022-08-14 DIAGNOSIS — Z8261 Family history of arthritis: Secondary | ICD-10-CM | POA: Diagnosis not present

## 2022-08-14 DIAGNOSIS — R0609 Other forms of dyspnea: Secondary | ICD-10-CM | POA: Diagnosis not present

## 2022-08-14 DIAGNOSIS — Z801 Family history of malignant neoplasm of trachea, bronchus and lung: Secondary | ICD-10-CM | POA: Diagnosis not present

## 2022-08-14 DIAGNOSIS — D75839 Thrombocytosis, unspecified: Secondary | ICD-10-CM | POA: Diagnosis not present

## 2022-08-14 DIAGNOSIS — R252 Cramp and spasm: Secondary | ICD-10-CM | POA: Diagnosis not present

## 2022-08-14 NOTE — Patient Instructions (Addendum)
[place] at [facility] Discharge Instructions  You were seen today by Tarri Abernethy PA-C for the following conditions.  HISTORY OF LEFT BREAST CANCER: - Your most recent mammogram did not show any signs of breast cancer. - Your primary care doctor will perform your breast exam later this month.  After that, your next breast exam should be done in March/April 2024. - Your next mammogram will be due in July 2024. - Continue to take letrozole (Femara) as prescribed for your breast cancer.  We will send your original tumor sample for additional testing to see how long you need to continue taking letrozole.  ESSENTIAL THROMBOCYTOSIS: Your platelets and blood count are slightly higher than their target range.   You can continue to take Hydrea 2 tablets Monday/Wednesday/Friday and 1 tablet every other day of the week. We will recheck your labs again in 3 months (CMP, CBC/differential, LDH).  We will order these labs to be done at [facility] so that they can be checked in conjunction with other labs ordered by your PCP  MEDICATIONS: - Continue hydroxyurea (Hydrea) as above - Continue vitamin D once per week.  FOLLOW-UP APPOINTMENT: Office visit in 3 months, after labs   Thank you for choosing [place] at [facility] to provide your oncology and hematology care.  To afford each patient quality time with our provider, please arrive at least 15 minutes before your scheduled appointment time.   If you have a lab appointment with the [facility] please come in thru the Main Entrance and check in at the main information desk.  You need to re-schedule your appointment should you arrive 10 or more minutes late.  We strive to give you quality time with our providers, and arriving late affects you and other patients whose appointments are after yours.  Also, if you no show three or more times for appointments you may be dismissed from the clinic at the  providers discretion.     Again, thank you for choosing [facility].  Our hope is that these requests will decrease the amount of time that you wait before being seen by our physicians.       _____________________________________________________________  Should you have questions after your visit to [facility], please contact our office at [phone] and follow the prompts.  Our office hours are 8:00 a.m. and 4:30 p.m. Monday - Friday.  Please note that voicemails left after 4:00 p.m. may not be returned until the following business day.  We are closed weekends and major holidays.  You do have access to a nurse 24-7, just call the main number to the clinic [phone] and do not press any options, hold on the line and a nurse will answer the phone.    For prescription refill requests, have your pharmacy contact our office and allow 72 hours.    Due to Covid, you will need to wear a mask upon entering the hospital. If you do not have a mask, a mask will be given to you at the Main Entrance upon arrival. For doctor visits, patients may have 1 support person age 27 or older with them. For treatment visits, patients can not have anyone with them due to social distancing guidelines and our immunocompromised population.

## 2022-08-15 ENCOUNTER — Encounter: Payer: Self-pay | Admitting: Cardiology

## 2022-08-15 ENCOUNTER — Inpatient Hospital Stay: Payer: Medicare Other | Attending: Hematology | Admitting: Cardiology

## 2022-08-15 VITALS — BP 148/90 | HR 74 | Ht 64.0 in | Wt 237.4 lb

## 2022-08-15 DIAGNOSIS — I272 Pulmonary hypertension, unspecified: Secondary | ICD-10-CM | POA: Diagnosis not present

## 2022-08-15 DIAGNOSIS — I38 Endocarditis, valve unspecified: Secondary | ICD-10-CM | POA: Diagnosis not present

## 2022-08-15 DIAGNOSIS — I5032 Chronic diastolic (congestive) heart failure: Secondary | ICD-10-CM

## 2022-08-15 DIAGNOSIS — I5020 Unspecified systolic (congestive) heart failure: Secondary | ICD-10-CM

## 2022-08-15 MED ORDER — EMPAGLIFLOZIN 10 MG PO TABS: 10.0000 mg | ORAL_TABLET | Freq: Every day | ORAL | 6 refills | Status: DC

## 2022-08-15 NOTE — Progress Notes (Signed)
Cardiology Office Note  Date: 08/15/2022   ID: Lori, Stafford 1941/03/12, MRN [id number]  PCP:  [name], MD  Cardiologist:  Rozann Lesches, MD Electrophysiologist:  None   Chief Complaint  Patient presents with   Cardiac follow-up    History of Present Illness: [name] is an 81 y.o. female last seen in June.  She is here today with her daughter for a follow-up visit.  I did see interval visit with Dr. Melvyn Novas and reviewed his recommendations.  She presents with fairly stable weight (the one recorded on October 9 is clearly incorrect and not consistent with her recent weights).  She remains on Lasix 60 mg daily.  Reports NYHA class II-III dyspnea as before.  We discussed her echocardiogram from last year, specifically valvular heart disease and also pulmonary hypertension.  I did talk with her about various management strategies, also potential for invasive testing such as heart catheterization.  She is clear that she does not want to undergo any invasive evaluation at this point.  I personally reviewed her ECG today which shows sinus rhythm with right bundle branch block.  We went over her medications, discussed addition of SGLT2 inhibitor.  Also plan on updating her echocardiogram.  Past Medical History:  Diagnosis Date   Arthritis    Chronic diastolic heart failure (HCC)    Essential hypertension    GERD (gastroesophageal reflux disease)    Gout    Hiatal hernia    History of endometrial cancer    Sp hysterectomy 2012   Ischemic colitis ([place]) 2016   Myeloproliferative disorder, JAK-2 positive    Obesity    Pneumonia    Polycythemia vera(238.4)    TIA (transient ischemic attack)     Past Surgical History:  Procedure Laterality Date   ABDOMINAL HYSTERECTOMY  09/12/2011   Procedure: HYSTERECTOMY ABDOMINAL;  Surgeon: Janie Morning, MD;  Location: [facility];  Service: Gynecology;  Laterality: N/A;  Total Abdominal Hysterectomy, Bilateral Salpingo  Oophorectomy   BREAST LUMPECTOMY Left 07/10/2017   LEFT BREAST LUMPECTOMY WITH RADIOACTIVE SEED AND SENTINEL LYMPH NODE BIOPSY    BREAST LUMPECTOMY WITH RADIOACTIVE SEED AND SENTINEL LYMPH NODE BIOPSY Left 07/10/2017   Procedure: LEFT BREAST LUMPECTOMY WITH RADIOACTIVE SEED AND SENTINEL LYMPH NODE BIOPSY;  Surgeon: Rolm Bookbinder, MD;  Location: [facility];  Service: General;  Laterality: Left;   COLONOSCOPY  07/05/2012   Procedure: COLONOSCOPY;  Surgeon: Rogene Houston, MD;  Location: [facility];  Service: Endoscopy;  Laterality: N/A;  730   EYE SURGERY     bilateral 8 - 12 yrs ago   FLEXIBLE SIGMOIDOSCOPY N/A 04/03/2015   Procedure: FLEXIBLE SIGMOIDOSCOPY;  Surgeon: Rogene Houston, MD;  [facility]: [facility];  Service: Endoscopy;  Laterality: N/A;   HYSTEROSCOPY WITH D & C  08/15/2011   Procedure: DILATATION AND CURETTAGE (D&C) /HYSTEROSCOPY;  Surgeon: Jonnie Kind, MD;  Location: [facility];  Service: Gynecology;  Laterality: N/A;  With Suction Curette   JOINT REPLACEMENT     left knee replaced 2014   KNEE ARTHROPLASTY Left 03/15/2015   Procedure: COMPUTER ASSISTED TOTAL KNEE ARTHROPLASTY;  Surgeon: Marybelle Killings, MD;  Location: [facility];  Service: Orthopedics;  Laterality: Left;   SALPINGOOPHORECTOMY  09/12/2011   Procedure: SALPINGO OOPHERECTOMY;  Surgeon: Janie Morning, MD;  Location: [facility];  Service: Gynecology;  Laterality: Bilateral;    Current Outpatient Medications  Medication Sig Dispense Refill   aspirin EC 81 MG tablet Take 81  mg by mouth at bedtime.      carvedilol (COREG) 12.5 MG tablet Take 12.5 mg by mouth 2 (two) times daily with a meal. Take 1/2 tablet by 2 times daily     chlorhexidine (PERIDEX) 0.12 % solution      cloNIDine (CATAPRES) 0.3 MG tablet Take 0.15 mg by mouth 2 (two) times daily.     diltiazem (CARDIZEM CD) 180 MG 24 hr capsule Take 180 mg by mouth daily.      doxylamine, Sleep, (SLEEP AID) 25 MG tablet Take 25 mg by mouth at bedtime as needed.      empagliflozin (JARDIANCE) 10 MG TABS tablet Take 1 tablet (10 mg total) by mouth daily before breakfast. 30 tablet 6   esomeprazole (NEXIUM) 40 MG capsule Take 1 capsule (40 mg total) by mouth every morning. 30 capsule 11   furosemide (LASIX) 40 MG tablet Take 1.5 tablets (60 mg total) by mouth daily. 135 tablet 3   hydroxyurea (HYDREA) 500 MG capsule Take 2 tablets Monday through Friday; take 1 tablet on Saturday and Sunday.  May take with food to minimize GI side effects. 110 capsule 3   ibuprofen (ADVIL) 800 MG tablet Take 800 mg by mouth every 6 (six) hours as needed.     isosorbide mononitrate (IMDUR) 30 MG 24 hr tablet TAKE 1/2 (ONE-HALF) TABLET BY MOUTH AT BEDTIME 45 tablet 1   letrozole (FEMARA) 2.5 MG tablet Take 1 tablet by mouth once daily 90 tablet 0   losartan (COZAAR) 100 MG tablet Take 100 mg by mouth every evening.      meloxicam (MOBIC) 7.5 MG tablet Take 1 tablet (7.5 mg total) by mouth every other day. 30 tablet 5   Multiple Vitamins-Minerals (PRESERVISION AREDS PO) Take by mouth.     Simethicone (GAS RELIEF PO) Take 1 tablet by mouth daily.     simvastatin (ZOCOR) 10 MG tablet Take 10 mg by mouth every evening.      Vitamin D, Ergocalciferol, (DRISDOL) 1.25 MG (50000 UNIT) CAPS capsule Take 1 capsule (50,000 Units total) by mouth once a week. 12 capsule 0   No current facility-administered medications for this visit.   Allergies:  Carbamazepine and Sulfa antibiotics   ROS: No syncope.  Physical Exam: VS:  BP (!) 148/90 (BP Location: Left Arm, Patient Position: Sitting, Cuff Size: Large)   Pulse 74   Ht '5\' 4"'$  (1.626 m)   Wt 237 lb 6.4 oz (107.7 kg)   SpO2 91%   BMI 40.75 kg/m , BMI Body mass index is 40.75 kg/m.  Wt Readings from Last 3 Encounters:  08/15/22 237 lb 6.4 oz (107.7 kg)  08/14/22 204 lb 9.4 oz (92.8 kg)  07/12/22 236 lb (107 kg)    General: Patient appears comfortable at rest. HEENT: Conjunctiva and lids normal. Neck: Supple, no elevated JVP or  carotid bruits, no thyromegaly. Lungs: Clear to auscultation, nonlabored breathing at rest. Cardiac: Regular rate and rhythm, no S3, 3/6 systolic murmur, no pericardial rub. Extremities: Mild lower leg edema.  ECG:  An ECG dated 04/25/2021 was personally reviewed today and demonstrated:  Sinus rhythm with right bundle branch block.  Recent Labwork: 08/07/2022: ALT 12; AST 17; BUN 20; Creatinine, Ser 0.50; Hemoglobin 14.6; Platelets 448; Potassium 4.0; Sodium 138   Other Studies Reviewed Today:  Lexiscan Myoview 08/16/2021: Findings are consistent with ischemia. The study is intermediate risk given TID of 1.21 and inferolateral partially reversible defect.   The study is intermediate risk.  No ST deviation was noted.   LV perfusion is abnormal. There is evidence of ischemia in the inferolateral/apical region. There is no evidence of infarction.   Defect 1: There is a medium defect with moderate reduction in uptake present in the apical to basal inferolateral location(s) that is partially reversible. There is normal wall motion in the defect area. Consistent with ischemia.   Left ventricular function is normal. End diastolic cavity size is normal. Evidence of transient ischemic dilation (TID) noted. TID was appreciated quantitatively but not visually.   Echocardiogram 09/13/2021:  1. Left ventricular ejection fraction, by estimation, is 65 to 70%. The  left ventricle has normal function. The left ventricle has no regional  wall motion abnormalities. There is moderate left ventricular hypertrophy.  Left ventricular diastolic  parameters are indeterminate.   2. Right ventricular systolic function is normal. The right ventricular  size is normal. There is severely elevated pulmonary artery systolic  pressure.   3. Left atrial size was severely dilated.   4. The mitral valve is abnormal. No evidence of mitral valve  regurgitation. Mild to moderate mitral stenosis. Moderate mitral annular   calcification.   5. The tricuspid valve is abnormal.   6. The aortic valve is tricuspid. There is moderate calcification of the  aortic valve. There is moderate thickening of the aortic valve. Aortic  valve regurgitation is not visualized. Mild aortic valve stenosis. Aortic  valve mean gradient measures 10.0  mmHg. Aortic valve peak gradient measures 22.7 mmHg. Aortic valve area, by  VTI measures 2.11 cm   7. The inferior vena cava is dilated in size with >50% respiratory  variability, suggesting right atrial pressure of 8 mmHg.   Assessment and Plan:  1.  Severe pulmonary hypertension based on echocardiogram from last year, RVSP estimated at 67 mmHg.  She did have an evaluation by Dr. Melvyn Novas which I reviewed.  Chest CT in August showed mild fibrotic interstitial lung disease and PFTs are most consistent with restrictive lung disease, not clearly COPD.  She most likely has WHO group 2 and 3 disease particular in light of valvular heart disease including mitral stenosis/regurgitation.  As discussed above, she does not want to pursue further invasive testing.  Plan will be to update her echocardiogram, continue diuretic therapy, and add SGLT2 inhibitor.  2.  Ischemic heart disease based on previous Myoview with no active angina at this time.  Plan is conservative management and she remains on aspirin, Imdur, Cozaar, and Zocor.  Medication Adjustments/Labs and Tests Ordered: Current medicines are reviewed at length with the patient today.  Concerns regarding medicines are outlined above.   Tests Ordered: Orders Placed This Encounter  Procedures   EKG 12-Lead   ECHOCARDIOGRAM COMPLETE    Medication Changes: Meds ordered this encounter  Medications   empagliflozin (JARDIANCE) 10 MG TABS tablet    Sig: Take 1 tablet (10 mg total) by mouth daily before breakfast.    Dispense:  30 tablet    Refill:  6    08/15/2022 NEW    Disposition:  Follow up  6 weeks.  Signed, Satira Sark,  MD, [facility] 08/15/2022 4:21 PM    [facility], [place], [place] [address] Phone: [phone]; Fax: [phone]

## 2022-08-15 NOTE — Patient Instructions (Addendum)
Medication Instructions:  Your physician has recommended you make the following change in your medication:  Start jardiance 10 mg by mouth daily (co-pay is $47 per month) Continue other medications the same  Labwork: none  Testing/Procedures: Your physician has requested that you have an echocardiogram. Echocardiography is a painless test that uses sound waves to create images of your heart. It provides your doctor with information about the size and shape of your heart and how well your heart's chambers and valves are working. This procedure takes approximately one hour. There are no restrictions for this procedure.  Follow-Up: Your physician recommends that you schedule a follow-up appointment in: 6-8 weeks  Any Other Special Instructions Will Be Listed Below (If Applicable).  If you need a refill on your cardiac medications before your next appointment, please call your pharmacy.

## 2022-08-21 ENCOUNTER — Ambulatory Visit: Payer: Medicare Other | Attending: Cardiology

## 2022-08-21 DIAGNOSIS — I5020 Unspecified systolic (congestive) heart failure: Secondary | ICD-10-CM

## 2022-08-21 LAB — ECHOCARDIOGRAM COMPLETE
AR max vel: 1.57 cm2
AV Area VTI: 1.58 cm2
AV Area mean vel: 1.62 cm2
AV Mean grad: 9.3 mmHg
AV Peak grad: 17.6 mmHg
Ao pk vel: 2.1 m/s
Area-P 1/2: 2.39 cm2
Calc EF: 60.6 %
MV M vel: 5.86 m/s
MV Peak grad: 137.5 mmHg
S' Lateral: 2.42 cm
Single Plane A2C EF: 59.4 %
Single Plane A4C EF: 65.5 %

## 2022-08-28 DIAGNOSIS — Z170 Estrogen receptor positive status [ER+]: Secondary | ICD-10-CM | POA: Diagnosis not present

## 2022-08-28 DIAGNOSIS — C50412 Malignant neoplasm of upper-outer quadrant of left female breast: Secondary | ICD-10-CM | POA: Diagnosis not present

## 2022-09-02 ENCOUNTER — Other Ambulatory Visit: Payer: Self-pay | Admitting: Physician Assistant

## 2022-09-02 DIAGNOSIS — C50412 Malignant neoplasm of upper-outer quadrant of left female breast: Secondary | ICD-10-CM

## 2022-09-07 DIAGNOSIS — I5030 Unspecified diastolic (congestive) heart failure: Secondary | ICD-10-CM | POA: Diagnosis not present

## 2022-09-07 DIAGNOSIS — Z79899 Other long term (current) drug therapy: Secondary | ICD-10-CM | POA: Diagnosis not present

## 2022-09-07 DIAGNOSIS — I272 Pulmonary hypertension, unspecified: Secondary | ICD-10-CM | POA: Diagnosis not present

## 2022-09-07 DIAGNOSIS — D473 Essential (hemorrhagic) thrombocythemia: Secondary | ICD-10-CM | POA: Diagnosis not present

## 2022-09-08 ENCOUNTER — Other Ambulatory Visit: Payer: Self-pay

## 2022-09-08 ENCOUNTER — Other Ambulatory Visit: Payer: Self-pay | Admitting: Physician Assistant

## 2022-09-08 DIAGNOSIS — C50412 Malignant neoplasm of upper-outer quadrant of left female breast: Secondary | ICD-10-CM

## 2022-09-08 MED ORDER — VITAMIN D (ERGOCALCIFEROL) 1.25 MG (50000 UNIT) PO CAPS: 50000.0000 [IU] | ORAL_CAPSULE | ORAL | 0 refills | Status: DC

## 2022-09-08 NOTE — Progress Notes (Signed)
Patient called requesting refill for vitamin D supplement.  Most recent level is on the high side of normal, so new prescription ordered for vitamin D 50,000 units EVERY OTHER week rather than weekly.

## 2022-09-11 ENCOUNTER — Encounter: Payer: Self-pay | Admitting: *Deleted

## 2022-09-11 ENCOUNTER — Ambulatory Visit: Payer: Self-pay | Admitting: *Deleted

## 2022-09-11 NOTE — Patient Outreach (Signed)
  Care Coordination   Initial Visit Note   09/11/2022  Name: [name] MRN: [id number] DOB: Mar 13, 1941  [name] is a 81 y.o. year old female who sees Lori Noble, MD for primary care. I spoke with [name] by phone today.  What matters to the patients health and wellness today?  No Interventions Identified.  SDOH assessments and interventions completed:  Yes.  SDOH Interventions Today    Flowsheet Row Most Recent Value  SDOH Interventions   Food Insecurity Interventions Intervention Not Indicated  Housing Interventions Intervention Not Indicated  Transportation Interventions Intervention Not Indicated  Utilities Interventions Intervention Not Indicated  Alcohol Usage Interventions Intervention Not Indicated (Score <7)  Financial Strain Interventions Intervention Not Indicated  Physical Activity Interventions Intervention Not Indicated  Stress Interventions Intervention Not Indicated  Social Connections Interventions Intervention Not Indicated     Care Coordination Interventions Activated:  Yes.   Care Coordination Interventions:  Yes, Provided.   Follow up plan: No Further Intervention Required.  Encounter Outcome:  Pt. Visit Completed.   [name], BSW, MSW, LCSW  Licensed Education officer, [facility]  Mailing Lori N. [address] [place], [place] [address] Physical Address-[address] [place], [place] [address] Toll Free Main # [phone] Fax # [phone] Cell # [phone] [name]

## 2022-09-11 NOTE — Patient Instructions (Signed)
Visit Information  Thank you for taking time to visit with me today. Please don't hesitate to contact me if I can be of assistance to you.   Please call the care guide team at [phone] if you need to cancel or reschedule your appointment.   If you are experiencing a Mental Health or Behavioral Health Crisis or need someone to talk to, please call the Suicide and Crisis Lifeline: 988 call the [place] National Suicide Prevention Lifeline: 1-800-273-8255 or TTY: 1-800-799-4 TTY (1-800-799-4889) to talk to a trained counselor call 1-800-273-TALK (toll free, 24 hour hotline) go to [facility] [address], [place] ([phone]) call the [place] Crisis Line: 800-939-9988 call 911  Patient verbalizes understanding of instructions and care plan provided today and agrees to view in MyChart. Active MyChart status and patient understanding of how to access instructions and care plan via MyChart confirmed with patient.     No further follow up required.  Graylee Arutyunyan, BSW, MSW, LCSW  Licensed Clinical Social Worker  [facility]  Mailing Address-[address], [place], [place] [address] Physical Address-[address], [place], [place] [address] Toll Free Main # [phone] Fax # [phone] Cell # [phone] [email or web address]            

## 2022-09-12 ENCOUNTER — Telehealth: Payer: Self-pay | Admitting: Adult Health

## 2022-09-12 NOTE — Telephone Encounter (Signed)
JAG reviewed chart. Pt don't need a pap but can schedule a gyn appt. Pt will check schedule and call back for appt. [place]

## 2022-09-12 NOTE — Telephone Encounter (Signed)
Patient would like for [name] to give her a call and discuss if she needs a pap smear. Please advise.

## 2022-09-15 ENCOUNTER — Telehealth: Payer: Self-pay | Admitting: Cardiology

## 2022-09-15 NOTE — Telephone Encounter (Signed)
I spoke with patient and relayed what PharmD said. She will call pcp.

## 2022-09-15 NOTE — Telephone Encounter (Signed)
I will forward to PharmD for instructions

## 2022-09-15 NOTE — Telephone Encounter (Signed)
Pt c/o medication issue:  1. Name of Medication:   empagliflozin (JARDIANCE) 10 MG TABS tablet    2. How are you currently taking this medication (dosage and times per day)? Take 1 tablet (10 mg total) by mouth daily before breakfast. - Oral   3. Are you having a reaction (difficulty breathing--STAT)?   4. What is your medication issue? Pt states that since she started this medication she has gotten a yeast infection. She took monistat and it helped a little but now she is having a burning sensation when she urinates. Please advise.

## 2022-09-15 NOTE — Telephone Encounter (Signed)
A common side effect of Jardiance is yeast infections and/or UTI.  Recommend she follow up with PCP

## 2022-09-18 ENCOUNTER — Encounter (HOSPITAL_COMMUNITY): Payer: Self-pay

## 2022-09-19 ENCOUNTER — Encounter (INDEPENDENT_AMBULATORY_CARE_PROVIDER_SITE_OTHER): Payer: Medicare Other | Admitting: Ophthalmology

## 2022-09-20 DIAGNOSIS — H35722 Serous detachment of retinal pigment epithelium, left eye: Secondary | ICD-10-CM | POA: Diagnosis not present

## 2022-09-20 DIAGNOSIS — H353132 Nonexudative age-related macular degeneration, bilateral, intermediate dry stage: Secondary | ICD-10-CM | POA: Diagnosis not present

## 2022-09-20 DIAGNOSIS — H353221 Exudative age-related macular degeneration, left eye, with active choroidal neovascularization: Secondary | ICD-10-CM | POA: Diagnosis not present

## 2022-09-20 DIAGNOSIS — H3561 Retinal hemorrhage, right eye: Secondary | ICD-10-CM | POA: Diagnosis not present

## 2022-09-20 DIAGNOSIS — H43811 Vitreous degeneration, right eye: Secondary | ICD-10-CM | POA: Diagnosis not present

## 2022-09-26 ENCOUNTER — Ambulatory Visit: Payer: Medicare Other | Admitting: Internal Medicine

## 2022-09-30 ENCOUNTER — Emergency Department (HOSPITAL_COMMUNITY)
Admission: EM | Admit: 2022-09-30 | Discharge: 2022-09-30 | Disposition: A | Discharge status: Home / Self Care | Payer: Medicare Other | Attending: Emergency Medicine | Admitting: Emergency Medicine

## 2022-09-30 ENCOUNTER — Other Ambulatory Visit: Payer: Self-pay

## 2022-09-30 DIAGNOSIS — M542 Cervicalgia: Secondary | ICD-10-CM | POA: Insufficient documentation

## 2022-09-30 DIAGNOSIS — Z7982 Long term (current) use of aspirin: Secondary | ICD-10-CM | POA: Insufficient documentation

## 2022-09-30 DIAGNOSIS — Z79899 Other long term (current) drug therapy: Secondary | ICD-10-CM | POA: Insufficient documentation

## 2022-09-30 DIAGNOSIS — I10 Essential (primary) hypertension: Secondary | ICD-10-CM | POA: Insufficient documentation

## 2022-09-30 MED ORDER — HYDROCODONE-ACETAMINOPHEN 5-325 MG PO TABS: 1.0000 | ORAL_TABLET | Freq: Four times a day (QID) | ORAL | 0 refills | Status: DC | PRN

## 2022-09-30 MED ORDER — PREDNISONE 20 MG PO TABS
40.0000 mg | ORAL_TABLET | Freq: Once | ORAL | Status: AC
  Administered 2022-09-30: 40 mg via ORAL
  Filled 2022-09-30: qty 2

## 2022-09-30 MED ORDER — PREDNISONE 10 MG PO TABS: 20.0000 mg | ORAL_TABLET | Freq: Two times a day (BID) | ORAL | 0 refills | Status: DC

## 2022-09-30 NOTE — Discharge Instructions (Signed)
Begin taking prednisone as prescribed.  Begin taking hydrocodone as prescribed as needed for pain.  Follow-up with primary doctor if symptoms are not improving in the next week.

## 2022-09-30 NOTE — ED Triage Notes (Addendum)
Pt c/o right neck pain that started Thursday night. States it hurts worse when she lays flat or tries to turn her head.

## 2022-09-30 NOTE — ED Provider Notes (Signed)
[facility] EMERGENCY DEPARTMENT Provider Note   CSN: [id number] Arrival date & time: 09/30/22  0543     History  No chief complaint on file.   [name] is a 81 y.o. female.  Patient is an 81 year old female with past medical history of GERD, hypertension, macular degeneration.  Patient presenting today with complaints of neck pain.  This started the day after cooking a large Thanksgiving dinner.  She describes an ache to the right posterior neck that is worse when she turns her head and worst when she tries to lay flat.  She denies any radiation down her arm.  She denies any numbness or tingling.  She denies any weakness of the arm.  She has been trying over-the-counter remedies with little relief.  The history is provided by the patient.       Home Medications Prior to Admission medications   Medication Sig Start Date End Date Taking? Authorizing Provider  aspirin EC 81 MG tablet Take 81 mg by mouth at bedtime.     [provider]  carvedilol (COREG) 12.5 MG tablet Take 12.5 mg by mouth 2 (two) times daily with a meal. Take 1/2 tablet by 2 times daily    [provider]  chlorhexidine (PERIDEX) 0.12 % solution  04/07/22   [provider]  cloNIDine (CATAPRES) 0.3 MG tablet Take 0.15 mg by mouth 2 (two) times daily.    [provider]  diltiazem (CARDIZEM CD) 180 MG 24 hr capsule Take 180 mg by mouth daily.  03/05/13   [provider]  doxylamine, Sleep, (SLEEP AID) 25 MG tablet Take 25 mg by mouth at bedtime as needed.    [provider]  empagliflozin (JARDIANCE) 10 MG TABS tablet Take 1 tablet (10 mg total) by mouth daily before breakfast. 08/15/22   Satira Sark, MD  esomeprazole (NEXIUM) 40 MG capsule Take 1 capsule (40 mg total) by mouth every morning. 09/15/14   Setzer, Rona Ravens, NP  furosemide (LASIX) 40 MG tablet Take 1.5 tablets (60 mg total) by mouth daily. 04/06/22   Satira Sark, MD  hydroxyurea  (HYDREA) 500 MG capsule Take 2 tablets Monday through Friday; take 1 tablet on Saturday and Sunday.  May take with food to minimize GI side effects. 05/18/22   Harriett Rush, PA-C  ibuprofen (ADVIL) 800 MG tablet Take 800 mg by mouth every 6 (six) hours as needed. 04/07/22   [provider]  isosorbide mononitrate (IMDUR) 30 MG 24 hr tablet TAKE 1/2 (ONE-HALF) TABLET BY MOUTH AT BEDTIME 07/24/22   Satira Sark, MD  letrozole [facility]) 2.5 MG tablet Take 1 tablet by mouth once daily 07/31/22   Derek Jack, MD  losartan (COZAAR) 100 MG tablet Take 100 mg by mouth every evening.  02/25/13   [provider]  meloxicam (MOBIC) 7.5 MG tablet Take 1 tablet (7.5 mg total) by mouth every other day. 07/17/22   Carole Civil, MD  Multiple Vitamins-Minerals (PRESERVISION AREDS PO) Take by mouth.    [provider]  Simethicone (GAS RELIEF PO) Take 1 tablet by mouth daily.    [provider]  simvastatin (ZOCOR) 10 MG tablet Take 10 mg by mouth every evening.     [provider]  Vitamin D, Ergocalciferol, (DRISDOL) 1.25 MG (50000 UNIT) CAPS capsule Take 1 capsule (50,000 Units total) by mouth every 14 (fourteen) days. 09/08/22   Harriett Rush, PA-C      Allergies  Carbamazepine and Sulfa antibiotics    Review of Systems   Review of Systems  All other systems reviewed and are negative.   Physical Exam Updated Vital Signs BP (!) 182/86   Pulse 71   Temp (!) 97.5 F (36.4 C) (Oral)   Resp 19   Ht '5\' 4"'$  (1.626 m)   Wt 107 kg   SpO2 93%   BMI 40.49 kg/m  Physical Exam Vitals and nursing note reviewed.  Constitutional:      General: She is not in acute distress.    Appearance: Normal appearance. She is not ill-appearing.  HENT:     Head: Normocephalic and atraumatic.  Neck:     Comments: There is tenderness to palpation in the soft tissues of the right posterior and lateral neck.  There is no palpable abnormality.  Ulnar  and radial pulses are intact of the right arm and motor and sensation are intact throughout the entire right hand. Pulmonary:     Effort: Pulmonary effort is normal.  Skin:    General: Skin is warm and dry.  Neurological:     Mental Status: She is alert.     ED Results / Procedures / Treatments   Labs (all labs ordered are listed, but only abnormal results are displayed) Labs Reviewed - No data to display  EKG None  Radiology No results found.  Procedures Procedures    Medications Ordered in ED Medications  predniSONE (DELTASONE) tablet 40 mg (has no administration in time range)    ED Course/ Medical Decision Making/ A&P  Patient presenting with right-sided neck pain that began in the absence of any injury or trauma, but did perform repetitive movements while cooking Thanksgiving dinner before the onset.  Physical examination reveals tenderness to the soft tissues to the right lateral and posterior neck, but neurovascularly intact right arm.  I highly suspect a musculoskeletal etiology to her pain and will treat with prednisone as an anti-inflammatory.  She will also be given hydrocodone which she can take for pain.  Final Clinical Impression(s) / ED Diagnoses Final diagnoses:  None    Rx / DC Orders ED Discharge Orders     None         Veryl Speak, MD 09/30/22 [id number]

## 2022-10-02 MED FILL — Hydrocodone-Acetaminophen Tab 5-325 MG: ORAL | Qty: 6 | Status: AC

## 2022-10-03 NOTE — Progress Notes (Unsigned)
Cardiology Office Note  Date: 10/04/2022   ID: Suheyla, Mortellaro 1941/02/29, MRN [id number]  PCP:  [name], MD  Cardiologist:  Rozann Lesches, MD Electrophysiologist:  None   Chief Complaint  Patient presents with   Cardiac follow-up    History of Present Illness: JERIAH SKUFCA is an 81 y.o. female last seen in October.  She is here today with her daughter for a follow-up visit.  States that she has been tolerating Jardiance, has noticed that her urine output has increased somewhat.  Weights are relatively stable, she is concurrently on a prednisone taper.  Recent follow-up echocardiogram in October revealed LVEF 60 to 65% with mild diastolic dysfunction, severe pulmonary hypertension with estimated RVSP 65 mmHg, mild mitral regurgitation, and mild aortic stenosis.   Past Medical History:  Diagnosis Date   Arthritis    Chronic diastolic heart failure ([facility])    Essential hypertension    GERD (gastroesophageal reflux disease)    Gout    Hiatal hernia    History of endometrial cancer    Sp hysterectomy 2012   Ischemic colitis ([place]) 2016   Myeloproliferative disorder, JAK-2 positive    Obesity    Pneumonia    Polycythemia vera(238.4)    TIA (transient ischemic attack)     Past Surgical History:  Procedure Laterality Date   ABDOMINAL HYSTERECTOMY  09/12/2011   Procedure: HYSTERECTOMY ABDOMINAL;  Surgeon: Janie Morning, MD;  Location: [facility];  Service: Gynecology;  Laterality: N/A;  Total Abdominal Hysterectomy, Bilateral Salpingo Oophorectomy   BREAST LUMPECTOMY Left 07/10/2017   LEFT BREAST LUMPECTOMY WITH RADIOACTIVE SEED AND SENTINEL LYMPH NODE BIOPSY    BREAST LUMPECTOMY WITH RADIOACTIVE SEED AND SENTINEL LYMPH NODE BIOPSY Left 07/10/2017   Procedure: LEFT BREAST LUMPECTOMY WITH RADIOACTIVE SEED AND SENTINEL LYMPH NODE BIOPSY;  Surgeon: Rolm Bookbinder, MD;  Location: [place];  Service: General;  Laterality: Left;   COLONOSCOPY  07/05/2012   Procedure:  COLONOSCOPY;  Surgeon: Rogene Houston, MD;  Location: [facility];  Service: Endoscopy;  Laterality: N/A;  730   EYE SURGERY     bilateral 8 - 12 yrs ago   FLEXIBLE SIGMOIDOSCOPY N/A 04/03/2015   Procedure: FLEXIBLE SIGMOIDOSCOPY;  Surgeon: Rogene Houston, MD;  Location: [facility];  Service: Endoscopy;  Laterality: N/A;   HYSTEROSCOPY WITH D & C  08/15/2011   Procedure: DILATATION AND CURETTAGE (D&C) /HYSTEROSCOPY;  Surgeon: Jonnie Kind, MD;  Location: [facility];  Service: Gynecology;  Laterality: N/A;  With Suction Curette   JOINT REPLACEMENT     left knee replaced 2014   KNEE ARTHROPLASTY Left 03/15/2015   Procedure: COMPUTER ASSISTED TOTAL KNEE ARTHROPLASTY;  Surgeon: Marybelle Killings, MD;  Location: [place];  Service: Orthopedics;  Laterality: Left;   SALPINGOOPHORECTOMY  09/12/2011   Procedure: SALPINGO OOPHERECTOMY;  Surgeon: Janie Morning, MD;  Location: [facility];  Service: Gynecology;  Laterality: Bilateral;    Current Outpatient Medications  Medication Sig Dispense Refill   aspirin EC 81 MG tablet Take 81 mg by mouth at bedtime.      carvedilol (COREG) 12.5 MG tablet Take 12.5 mg by mouth 2 (two) times daily with a meal. Take 1/2 tablet by 2 times daily     chlorhexidine (PERIDEX) 0.12 % solution      cloNIDine (CATAPRES) 0.3 MG tablet Take 0.15 mg by mouth 2 (two) times daily.     diltiazem (CARDIZEM CD) 180 MG 24 hr capsule Take 180 mg by mouth  daily.      doxylamine, Sleep, (SLEEP AID) 25 MG tablet Take 25 mg by mouth at bedtime as needed.     esomeprazole (NEXIUM) 40 MG capsule Take 1 capsule (40 mg total) by mouth every morning. 30 capsule 11   furosemide (LASIX) 40 MG tablet Take 1.5 tablets (60 mg total) by mouth daily. 135 tablet 3   HYDROcodone-acetaminophen (NORCO) 5-325 MG tablet Take 1-2 tablets by mouth every 6 (six) hours as needed. 4 tablet 0   hydroxyurea (HYDREA) 500 MG capsule Take 2 tablets Monday through Friday; take 1 tablet on Saturday and Sunday.  May take with  food to minimize GI side effects. 110 capsule 3   ibuprofen (ADVIL) 800 MG tablet Take 800 mg by mouth every 6 (six) hours as needed.     isosorbide mononitrate (IMDUR) 30 MG 24 hr tablet TAKE 1/2 (ONE-HALF) TABLET BY MOUTH AT BEDTIME 45 tablet 1   letrozole (FEMARA) 2.5 MG tablet Take 1 tablet by mouth once daily 90 tablet 0   losartan (COZAAR) 100 MG tablet Take 100 mg by mouth every evening.      meloxicam (MOBIC) 7.5 MG tablet Take 1 tablet (7.5 mg total) by mouth every other day. 30 tablet 5   Multiple Vitamins-Minerals (PRESERVISION AREDS PO) Take by mouth.     predniSONE (DELTASONE) 10 MG tablet Take 2 tablets (20 mg total) by mouth 2 (two) times daily. 20 tablet 0   Simethicone (GAS RELIEF PO) Take 1 tablet by mouth daily.     simvastatin (ZOCOR) 10 MG tablet Take 10 mg by mouth every evening.      Vitamin D, Ergocalciferol, (DRISDOL) 1.25 MG (50000 UNIT) CAPS capsule Take 1 capsule (50,000 Units total) by mouth every 14 (fourteen) days. 6 capsule 0   empagliflozin (JARDIANCE) 10 MG TABS tablet Take 1 tablet (10 mg total) by mouth daily before breakfast. 30 tablet 6   No current facility-administered medications for this visit.   Allergies:  Carbamazepine and Sulfa antibiotics   ROS: No palpitations or syncope.  Physical Exam: VS:  BP (!) 152/82   Pulse 76   Ht '5\' 4"'$  (1.626 m)   Wt 236 lb (107 kg)   SpO2 93%   BMI 40.51 kg/m , BMI Body mass index is 40.51 kg/m.  Wt Readings from Last 3 Encounters:  10/04/22 236 lb (107 kg)  09/30/22 235 lb 14.3 oz (107 kg)  08/15/22 237 lb 6.4 oz (107.7 kg)    General: Patient appears comfortable at rest. HEENT: Conjunctiva and lids normal. Neck: Supple, no elevated JVP or carotid bruits. Lungs: Clear to auscultation, nonlabored breathing at rest. Cardiac: Regular rate and rhythm, no S3, 3/6 systolic murmur. Extremities: Mild leg edema/lymphedema.  ECG:  An ECG dated 08/15/2022 was personally reviewed today and demonstrated:  Sinus  rhythm with right bundle branch block.  Recent Labwork: 08/07/2022: ALT 12; AST 17; BUN 20; Creatinine, Ser 0.50; Hemoglobin 14.6; Platelets 448; Potassium 4.0; Sodium 138   Other Studies Reviewed Today:  Lexiscan Myoview 08/16/2021: Findings are consistent with ischemia. The study is intermediate risk given TID of 1.21 and inferolateral partially reversible defect.   The study is intermediate risk.   No ST deviation was noted.   LV perfusion is abnormal. There is evidence of ischemia in the inferolateral/apical region. There is no evidence of infarction.   Defect 1: There is a medium defect with moderate reduction in uptake present in the apical to basal inferolateral location(s) that is partially  reversible. There is normal wall motion in the defect area. Consistent with ischemia.   Left ventricular function is normal. End diastolic cavity size is normal. Evidence of transient ischemic dilation (TID) noted. TID was appreciated quantitatively but not visually.   Echocardiogram 08/21/2022:  1. Left ventricular ejection fraction, by estimation, is 60 to 65%. The  left ventricle has normal function. The left ventricle has no regional  wall motion abnormalities. There is moderate asymmetric left ventricular  hypertrophy of the septal segment.  Left ventricular diastolic parameters are consistent with Grade I  diastolic dysfunction (impaired relaxation). The average left ventricular  global longitudinal strain is -19.2 %. The global longitudinal strain is  normal.   2. Right ventricular systolic function is normal. The right ventricular  size is normal. There is severely elevated pulmonary artery systolic  pressure. The estimated right ventricular systolic pressure is 89.2 mmHg.   3. Left atrial size was severely dilated.   4. The mitral valve is abnormal. Mild mitral valve regurgitation.  Moderate mitral annular calcification.   5. Tricuspid valve regurgitation is mild to moderate.   6.  The aortic valve is tricuspid. Aortic valve regurgitation is not  visualized. Mild aortic valve stenosis. Aortic valve area, by VTI measures  1.58 cm. Aortic valve mean gradient measures 9.3 mmHg.   7. Aortic dilatation noted. There is borderline dilatation of the aortic  root, measuring 39 mm.   8. The inferior vena cava is dilated in size with <50% respiratory  variability, suggesting right atrial pressure of 15 mmHg.   Assessment and Plan:  1.  Severe pulmonary hypertension, most likely WHO group 2 and 3 based on work-up to date.  As discussed previously, she does not want to pursue further invasive testing and we will continue with medical therapy.  Recent echocardiogram reviewed with relatively stable findings.  Tolerating Jardiance which will be continued, no change in diuretic regimen otherwise.  2.  Ischemic heart disease based on prior noninvasive cardiac testing.  No active angina with current level of activity.  She is on aspirin, Coreg, Imdur, and Zocor.  Medication Adjustments/Labs and Tests Ordered: Current medicines are reviewed at length with the patient today.  Concerns regarding medicines are outlined above.   Tests Ordered: No orders of the defined types were placed in this encounter.   Medication Changes: Meds ordered this encounter  Medications   empagliflozin (JARDIANCE) 10 MG TABS tablet    Sig: Take 1 tablet (10 mg total) by mouth daily before breakfast.    Dispense:  30 tablet    Refill:  6    08/15/2022 NEW    Disposition:  Follow up  6 months.  Signed, Satira Sark, MD, [facility] 10/04/2022 3:06 PM    [facility] at [facility] [address] [place], [place] [address] Phone: [phone]; Fax: [phone]

## 2022-10-04 ENCOUNTER — Inpatient Hospital Stay: Payer: Medicare Other | Attending: Cardiology | Admitting: Cardiology

## 2022-10-04 ENCOUNTER — Encounter: Payer: Self-pay | Admitting: Cardiology

## 2022-10-04 VITALS — BP 152/82 | HR 76 | Ht 64.0 in | Wt 236.0 lb

## 2022-10-04 DIAGNOSIS — I350 Nonrheumatic aortic (valve) stenosis: Secondary | ICD-10-CM | POA: Diagnosis not present

## 2022-10-04 DIAGNOSIS — I272 Pulmonary hypertension, unspecified: Secondary | ICD-10-CM

## 2022-10-04 DIAGNOSIS — I342 Nonrheumatic mitral (valve) stenosis: Secondary | ICD-10-CM

## 2022-10-04 MED ORDER — EMPAGLIFLOZIN 10 MG PO TABS: 10.0000 mg | ORAL_TABLET | Freq: Every day | ORAL | 6 refills | Status: DC

## 2022-10-04 NOTE — Patient Instructions (Signed)
Medication Instructions:  Your physician recommends that you continue on your current medications as directed. Please refer to the Current Medication list given to you today.   Labwork: None today  Testing/Procedures: None today  Follow-Up: 6 months  Any Other Special Instructions Will Be Listed Below (If Applicable).  If you need a refill on your cardiac medications before your next appointment, please call your pharmacy.  

## 2022-10-09 ENCOUNTER — Other Ambulatory Visit: Payer: Self-pay

## 2022-10-09 DIAGNOSIS — D473 Essential (hemorrhagic) thrombocythemia: Secondary | ICD-10-CM

## 2022-10-09 NOTE — Addendum Note (Signed)
Addended by: Charlyne Petrin B on: 10/09/2022 09:16 AM   Modules accepted: Orders

## 2022-10-17 DIAGNOSIS — I5030 Unspecified diastolic (congestive) heart failure: Secondary | ICD-10-CM | POA: Diagnosis not present

## 2022-10-17 DIAGNOSIS — J9611 Chronic respiratory failure with hypoxia: Secondary | ICD-10-CM | POA: Diagnosis not present

## 2022-10-17 DIAGNOSIS — I10 Essential (primary) hypertension: Secondary | ICD-10-CM | POA: Diagnosis not present

## 2022-10-17 DIAGNOSIS — Z23 Encounter for immunization: Secondary | ICD-10-CM | POA: Diagnosis not present

## 2022-10-22 ENCOUNTER — Other Ambulatory Visit (HOSPITAL_COMMUNITY): Payer: Self-pay | Admitting: Hematology

## 2022-10-23 ENCOUNTER — Encounter: Payer: Self-pay | Admitting: *Deleted

## 2022-10-23 NOTE — Progress Notes (Signed)
Letrozole refill approved.  Patient tolerating and is to continue therapy. 

## 2022-11-01 DIAGNOSIS — D473 Essential (hemorrhagic) thrombocythemia: Secondary | ICD-10-CM | POA: Diagnosis not present

## 2022-11-13 ENCOUNTER — Inpatient Hospital Stay: Payer: Medicare Other | Attending: Hematology | Admitting: Physician Assistant

## 2022-11-13 VITALS — BP 162/71 | HR 77 | Temp 98.1°F | Resp 18 | Wt 233.5 lb

## 2022-11-13 DIAGNOSIS — Z170 Estrogen receptor positive status [ER+]: Secondary | ICD-10-CM

## 2022-11-13 DIAGNOSIS — C50412 Malignant neoplasm of upper-outer quadrant of left female breast: Secondary | ICD-10-CM

## 2022-11-13 DIAGNOSIS — D473 Essential (hemorrhagic) thrombocythemia: Secondary | ICD-10-CM

## 2022-11-13 DIAGNOSIS — E559 Vitamin D deficiency, unspecified: Secondary | ICD-10-CM | POA: Diagnosis not present

## 2022-11-13 MED ORDER — HYDROXYUREA 500 MG PO CAPS: ORAL_CAPSULE | ORAL | 1 refills | Status: DC

## 2022-11-13 NOTE — Patient Instructions (Addendum)
[place]  Discharge Instructions  You were seen virtually today by Tarri Abernethy, PA.  She talked with you about how you have been feeling.  Sometimes your condition can cause issues with your blood cells.  Your red blood cells are elevated again at this visit.  Your platelets are also elevated.  Eugene Garnet would like for you to increase your Hydroxyurea to 1 tablet on M/W/F and then 2 tablets on T/Th/S/S.  She talked to you about the issues and concerns you are having with your breast.  She had Dr. Delton Coombes come in and do a breast exam today.    The BCI test that we sent off at your last visit showed the following: 8.5% chance of your breast cancer returning, with taking the letrozole for 5 years.  For patients that take it for 10 years, it decreases the risk of the breast cancer returning to 2.8-3.6%.  Eugene Garnet feels that benefits outweigh the risk of continuing the letrozole.  She talked about the risks in detail.  You decided at this time to continue taking it.    Follow up:  - Continue taking Letrozole daily - Continue taking Vitamin D every other week - Continue taking your Aspirin daily - Continue taking Hydroxyurea but change to 1 tablets Monday, Wednesday and Friday and then 2 tablets on Tuesday, Thursday, Saturday and Sunday - We will see you back in 3 months with labs about 1 week before your visit.     Thank you for choosing [facility] to provide your oncology and hematology care.   To afford each patient quality time with our provider, please arrive at least 15 minutes before your scheduled appointment time. You may need to reschedule your appointment if you arrive late (10 or more minutes). Arriving late affects you and other patients whose appointments are after yours.  Also, if you miss three or more appointments without notifying the office, you may be dismissed from the clinic at the provider's discretion.    Again,  thank you for choosing [facility].  Our hope is that these requests will decrease the amount of time that you wait before being seen by our physicians.   If you have a lab appointment with the [facility] please come in thru the Main Entrance and check in at the main information desk.           _____________________________________________________________  Should you have questions after your visit to [facility], please contact our office at [phone] and follow the prompts.  Our office hours are 8:00 a.m. to 4:30 p.m. Monday - Thursday and 8:00 a.m. to 2:30 p.m. Friday.  Please note that voicemails left after 4:00 p.m. may not be returned until the following business day.  We are closed weekends and all major holidays.  You do have access to a nurse 24-7, just call the main number to the clinic [phone] and do not press any options, hold on the line and a nurse will answer the phone.    For prescription refill requests, have your pharmacy contact our office and allow 72 hours.    Masks are optional in the cancer centers. If you would like for your care team to wear a mask while they are taking care of you, please let them know. You may have one support person who is at least 83 years old accompany you for your appointments.

## 2022-11-13 NOTE — Progress Notes (Signed)
VIRTUAL VISIT via [place] at [facility] connected with [name]  on 11/13/22 at  3:19 PM by video and verified that I am speaking with the correct person using two identifiers.  Location: Patient: [place] Provider: Home Office   I discussed the limitations, risks, security and privacy concerns of performing an evaluation and management service by virtual/video visit and the availability of in person appointments. I also discussed with the patient that there may be a patient responsible charge related to this service. The patient expressed understanding and agreed to proceed.  REASON FOR VISIT:  Follow-up for left-sided breast cancer and JAK2 + polycythemia vera/essential thrombocytosis   PRIOR THERAPY: Left-sided lumpectomy with adjuvant XRT   CURRENT THERAPY: - Letrozole daily since January 2019 - Hydrea [current dose 2 tablets (1000 mg) M/W/F, 1 tablet (500 mg) T/T/S/S]  BRIEF ONCOLOGIC HISTORY:    Oncology History  Malignant neoplasm of upper-outer quadrant of left breast in female, estrogen receptor positive ([place])  07/10/2017 Surgery   Left lumpectomy: IDC grade 2, 1.3 cm, intermediate grade DCIS, margins negative, 1/2 lymph nodes positive, ER 5% positive weak staining, PR 0%, HER-2 negative ratio 1.73, Ki-67 15%, T1cN0 stage IA AJCC 8   07/10/2017 Miscellaneous   Mammaprint: Low risk, 10-year risk of recurrence untreated 10%   10/01/2017 - 11/14/2017 Radiation Therapy   Adj XRT   11/2017 -  Anti-estrogen oral therapy   Letrozole daily     CANCER STAGING: Cancer Staging  Malignant neoplasm of upper-outer quadrant of left breast in female, estrogen receptor positive ([place]) Staging form: Breast, AJCC 8th Edition - Clinical stage from 05/22/2017: Stage IA (cT1, cN0, cM0, G1, ER+, PR-, HER2-) - Signed by Holley Bouche, NP on 06/22/2017 - Pathologic: Stage IIA (pT1c, pN1a, cM0, G2, ER+, PR-, HER2-) - Unsigned   INTERVAL  HISTORY:   Ms. KEELI ROBERG, a 82 y.o. female, returns for routine follow-up of her polycythemia vera and history of breast cancer. Twisha was last seen on 08/14/2022 by Tarri Abernethy PA-C.   At today's visit, she  reports feeling well.  She denies any recent hospitalizations, surgeries, or changes in her  baseline health status.  She continues to take Hydrea 1000 mg MWF and 500 mg TTSS, tolerating it well without any skin sores, GI side effects, or worsening fatigue.     Overall energy level is stable.  She continues to take aspirin 81 mg daily.  She denies any aquagenic pruritus, Raynaud's, erythromelalgia. No vasomotor symptoms such as dizziness, tinnitus, intermittent blurry vision (she has chronic visual deficit from macular degeneration), strokelike symptoms, or neuropathy.  She does note new left-sided headache since starting Jardiance, which she reports is always in the same place, just above her ear on the left side of her head; no acute vision changes or neurologic changes.  For the past month, she feels that there has been some swelling in her left lateral breast and some "pulling pain" under her left axillary region, although she reports there has not been any palpable abnormalities.  She denies any other symptoms of recurrence such as bone pain, chest pain, or abdominal pain. She has chronic dyspnea on exertion.  New headaches as described above, but no new seizures or focal neurologic deficits.  No B symptoms such as fever, chills, night sweats, unintentional weight loss.   She continues to take letrozole as prescribed, and is tolerating it well.  She does have  leg cramps from time to time.  She denies any hot flashes, mood swings, or abnormal weight gain.   She reports 80% energy and 90% appetite.  She is maintaining stable weight at this time.  ASSESSMENT & PLAN:  1.  JAK2+ essential thrombocytosis versus polycythemia vera - JAK2 V617F mutation detected in 2015 - No prior  history of thrombosis - Hydroxyurea was started in November 2019 - Current dose of hydroxyurea 2 tablets (1000 mg) M/W/F, 1 tablet (500 mg) T/T/S/S - No vasomotor symptoms, erythromelalgia, or aquagenic pruritus     - She is taking aspirin 81 mg daily. - Most recent labs sent by PCP (11/01/2022) show Hgb 16.0/hematocrit 47.2%/MCV 101, platelets 499, normal WBC 6.8, normal differential.  CMP normal.  LDH mildly elevated at 227. - Platelets are the primarily affected cell line, but she does have intermittent elevations in Hgb/HCT, which could indicate polycythemia vera rather than essential thrombocytosis.  No change in management or monitoring strategy.  Goal remains platelets <400 and HCT <45.0%. - PLAN: We will INCREASE her to Hydrea 500 mg M/W/F and 1000 mg T/T/S/S. - Repeat labs and RTC in 3 months  - Continue aspirin 81 mg daily - Patient is aware of alarm symptoms that would prompt more immediate medical attention.   2.  T1CN1A (stage IIa) malignant neoplasm of upper-outer quadrant of left breast - Left breast biopsy on 05/29/2017 of the upper outer quadrant mass - IDC, grade 1/2.  ER 5%, PR 0%, Ki-67 15%, HER-2 negative. - Left breast lumpectomy on 07/10/2017, IDC, grade 2, 1.3 cm, margins negative, 1/2 lymph nodes positive, pT1c, PN 1A - MammaPrint-low risk luminal A, average 10-year risk of recurrence untreated 10%. - Adjuvant XRT from 10/01/2017-11/15/2017. - Letrozole 1 mg daily started on 11/15/2017. - BCI testing (October 2023) showed that patient would likely benefit from extended endocrine therapy.  (8.5% risk of late distant recurrence after 5 years of adjuvant endocrine therapy, compared to 2.8% - 3.6% risk of late distant recurrence after 10 total years of adjuvant endocrine therapy) - She is tolerating letrozole without any major hot flashes.     - She reports some cramping in her legs on and off.     - Reviewed mammogram from 05/25/2022, BI-RADS Category 1, negative.  No  mammographic evidence of malignancy in either breast. - Patient reported that for the past month, she felt that there has been some swelling in her left lateral breast and some "pulling pain" under her left axillary region, although she reports there has not been any palpable abnormalities. Since I was not present in office for this visit, breast exam was kindly performed by Dr. Delton Coombes, who did not feel any suspicious mass but noted some mild soreness below left upper outer quadrant scar.   - ROS did not reveal any red flag symptoms of recurrence    - Most recent labs show baseline CBC and normal CMP - PLAN: Continue letrozole.  Goal of treatment is 10 years based on BCI results. -- Next mammogram due July 2024. - Patient recommended to watch area of tenderness and concern for the next few weeks.  If symptoms have not gone away in 2 weeks, patient advised to call our office so that we can arrange for left breast diagnostic mammogram.   3.  Bone health - Bone density scan (05/24/2021): T score -1.0, normal - Most recent vitamin D (08/07/2022) normal at 80.66 - Taking vitamin D 50,000 units every other week - PLAN: In the  setting of aromatase inhibitor use, we will continue to check bone density scan every 2 to 3 years, next due July 2024.  Will check vitamin D with next labs   4.  Social/family history: - She was recently widowed in February 2022.  She is living independently.  She works part-time for 6 hours a day, office job.  She is non-smoker. - Father had lung cancer.    PLAN SUMMARY: >> Labs in 3 months (CBC/D, CMP, LDH, Vitamin D) >> OFFICE visit in 3 months, after labs   REVIEW OF SYSTEMS:   Review of Systems  Constitutional:  Positive for fatigue. Negative for appetite change, chills, diaphoresis, fever and unexpected weight change.  HENT:   Negative for lump/mass, mouth sores and nosebleeds.   Eyes:  Negative for eye problems.  Respiratory:  Positive for cough and shortness of  breath (with exertion). Negative for hemoptysis.   Cardiovascular:  Negative for chest pain, leg swelling and palpitations.  Gastrointestinal:  Negative for abdominal pain, blood in stool, constipation, diarrhea, nausea and vomiting.  Genitourinary:  Negative for hematuria.   Skin: Negative.   Neurological:  Positive for headaches. Negative for dizziness and light-headedness.  Hematological:  Does not bruise/bleed easily.  Psychiatric/Behavioral:  Positive for sleep disturbance.     PHYSICAL EXAM: (Per limitations of video visit)  The patient is alert and oriented x 3, exhibiting adequate mentation, good mood, and ability to speak in full sentences and execute sound judgement.   NOTE: Since patient was present in clinic (provider off-site), patient's vital signs and weight were obtained by nursing staff at the time of this visit.    10/04/2022    2:33 PM 09/30/2022    5:59 AM 09/30/2022    5:56 AM  Vitals with BMI  Height '5\' 4"'$   '5\' 4"'$   Weight 236 lbs  235 lbs 14 oz  BMI 26.71  24.58  Systolic 099 833   Diastolic 82 86   Pulse 76 71        PAST MEDICAL/SURGICAL HISTORY:  Past Medical History:  Diagnosis Date   Arthritis    Chronic diastolic heart failure (HCC)    Essential hypertension    GERD (gastroesophageal reflux disease)    Gout    Hiatal hernia    History of endometrial cancer    Sp hysterectomy 2012   Ischemic colitis ([place]) 2016   Myeloproliferative disorder, JAK-2 positive    Obesity    Pneumonia    Polycythemia vera(238.4)    TIA (transient ischemic attack)    Past Surgical History:  Procedure Laterality Date   ABDOMINAL HYSTERECTOMY  09/12/2011   Procedure: HYSTERECTOMY ABDOMINAL;  Surgeon: Janie Morning, MD;  Location: [facility];  Service: Gynecology;  Laterality: N/A;  Total Abdominal Hysterectomy, Bilateral Salpingo Oophorectomy   BREAST LUMPECTOMY Left 07/10/2017   LEFT BREAST LUMPECTOMY WITH RADIOACTIVE SEED AND SENTINEL LYMPH NODE BIOPSY    BREAST  LUMPECTOMY WITH RADIOACTIVE SEED AND SENTINEL LYMPH NODE BIOPSY Left 07/10/2017   Procedure: LEFT BREAST LUMPECTOMY WITH RADIOACTIVE SEED AND SENTINEL LYMPH NODE BIOPSY;  Surgeon: Rolm Bookbinder, MD;  Location: [facility];  Service: General;  Laterality: Left;   COLONOSCOPY  07/05/2012   Procedure: COLONOSCOPY;  Surgeon: Rogene Houston, MD;  Location: [facility];  Service: Endoscopy;  Laterality: N/A;  730   EYE SURGERY     bilateral 8 - 12 yrs ago   FLEXIBLE SIGMOIDOSCOPY N/A 04/03/2015   Procedure: FLEXIBLE SIGMOIDOSCOPY;  Surgeon: Rogene Houston, MD;  [facility]: [facility];  Service: Endoscopy;  Laterality: N/A;   HYSTEROSCOPY WITH D & C  08/15/2011   Procedure: DILATATION AND CURETTAGE (D&C) /HYSTEROSCOPY;  Surgeon: Jonnie Kind, MD;  Location: [facility];  Service: Gynecology;  Laterality: N/A;  With Suction Curette   JOINT REPLACEMENT     left knee replaced 2014   KNEE ARTHROPLASTY Left 03/15/2015   Procedure: COMPUTER ASSISTED TOTAL KNEE ARTHROPLASTY;  Surgeon: Marybelle Killings, MD;  Location: [facility];  Service: Orthopedics;  Laterality: Left;   SALPINGOOPHORECTOMY  09/12/2011   Procedure: SALPINGO OOPHERECTOMY;  Surgeon: Janie Morning, MD;  Location: [facility];  Service: Gynecology;  Laterality: Bilateral;    SOCIAL HISTORY:  Social History   Socioeconomic History   Marital status: Widowed    Spouse name: Not on file   Number of children: 2   Years of education: 12   Highest education level: High school graduate  Occupational History   Not on file  Tobacco Use   Smoking status: Never    Passive exposure: Never   Smokeless tobacco: Never  Vaping Use   Vaping Use: Never used  Substance and Sexual Activity   Alcohol use: No   Drug use: No   Sexual activity: Not Currently    Partners: Male    Birth control/protection: Surgical    Comment: hysterectomy  Other Topics Concern   Not on file  Social History Narrative   Not on file   Social Determinants of Health   Financial  Resource Strain: Low Risk  (09/11/2022)   Overall Financial Resource Strain (CARDIA)    Difficulty of Paying Living Expenses: Not hard at all  Food Insecurity: No Food Insecurity (09/11/2022)   Hunger Vital Sign    Worried About Running Out of Food in the Last Year: Never true    [place] in the Last Year: Never true  Transportation Needs: No Transportation Needs (09/11/2022)   PRAPARE - Hydrologist (Medical): No    Lack of Transportation (Non-Medical): No  Physical Activity: Sufficiently Active (09/11/2022)   Exercise Vital Sign    Days of Exercise per Week: 5 days    Minutes of Exercise per Session: 30 min  Stress: No Stress Concern Present (09/11/2022)   [place]    Feeling of Stress : Only a little  Social Connections: Moderately Integrated (09/11/2022)   Social Connection and Isolation Panel [NHANES]    Frequency of Communication with Friends and Family: More than three times a week    Frequency of Social Gatherings with Friends and Family: More than three times a week    Attends Religious Services: More than 4 times per year    Active Member of [employer] or Organizations: Yes    Attends Archivist Meetings: More than 4 times per year    Marital Status: Widowed  Intimate Partner Violence: Not At Risk (09/11/2022)   Humiliation, Afraid, Rape, and Kick questionnaire    Fear of Current or Ex-Partner: No    Emotionally Abused: No    Physically Abused: No    Sexually Abused: No    FAMILY HISTORY:  Family History  Problem Relation Age of Onset   Hypertension Mother    Lung cancer Father    Arthritis/Rheumatoid Sister    Anesthesia problems Neg Hx    Hypotension Neg Hx    Malignant hyperthermia Neg Hx    Pseudochol deficiency Neg Hx  CURRENT MEDICATIONS:  Current Outpatient Medications  Medication Sig Dispense Refill   aspirin EC 81 MG tablet Take 81 mg by mouth at  bedtime.      carvedilol (COREG) 12.5 MG tablet Take 12.5 mg by mouth 2 (two) times daily with a meal. Take 1/2 tablet by 2 times daily     chlorhexidine (PERIDEX) 0.12 % solution      cloNIDine (CATAPRES) 0.3 MG tablet Take 0.15 mg by mouth 2 (two) times daily.     diltiazem (CARDIZEM CD) 180 MG 24 hr capsule Take 180 mg by mouth daily.      doxylamine, Sleep, (SLEEP AID) 25 MG tablet Take 25 mg by mouth at bedtime as needed.     empagliflozin (JARDIANCE) 10 MG TABS tablet Take 1 tablet (10 mg total) by mouth daily before breakfast. 30 tablet 6   esomeprazole (NEXIUM) 40 MG capsule Take 1 capsule (40 mg total) by mouth every morning. 30 capsule 11   furosemide (LASIX) 40 MG tablet Take 1.5 tablets (60 mg total) by mouth daily. 135 tablet 3   HYDROcodone-acetaminophen (NORCO) 5-325 MG tablet Take 1-2 tablets by mouth every 6 (six) hours as needed. 4 tablet 0   hydroxyurea (HYDREA) 500 MG capsule Take 2 tablets Monday through Friday; take 1 tablet on Saturday and Sunday.  May take with food to minimize GI side effects. 110 capsule 3   ibuprofen (ADVIL) 800 MG tablet Take 800 mg by mouth every 6 (six) hours as needed.     isosorbide mononitrate (IMDUR) 30 MG 24 hr tablet TAKE 1/2 (ONE-HALF) TABLET BY MOUTH AT BEDTIME 45 tablet 1   letrozole (FEMARA) 2.5 MG tablet Take 1 tablet by mouth once daily 90 tablet 0   losartan (COZAAR) 100 MG tablet Take 100 mg by mouth every evening.      meloxicam (MOBIC) 7.5 MG tablet Take 1 tablet (7.5 mg total) by mouth every other day. 30 tablet 5   Multiple Vitamins-Minerals (PRESERVISION AREDS PO) Take by mouth.     predniSONE (DELTASONE) 10 MG tablet Take 2 tablets (20 mg total) by mouth 2 (two) times daily. 20 tablet 0   Simethicone (GAS RELIEF PO) Take 1 tablet by mouth daily.     simvastatin (ZOCOR) 10 MG tablet Take 10 mg by mouth every evening.      Vitamin D, Ergocalciferol, (DRISDOL) 1.25 MG (50000 UNIT) CAPS capsule Take 1 capsule (50,000 Units total) by  mouth every 14 (fourteen) days. 6 capsule 0   No current facility-administered medications for this visit.    ALLERGIES:  Allergies  Allergen Reactions   Carbamazepine Hives and Other (See Comments)    headache   Sulfa Antibiotics     Rash    LABORATORY DATA:  I have reviewed the labs as listed.     Latest Ref Rng & Units 08/07/2022   12:50 PM 07/03/2022    1:15 PM 04/27/2022    1:15 PM  CBC  WBC 4.0 - 10.5 K/uL 7.1  6.8  8.1   Hemoglobin 12.0 - 15.0 g/dL 14.6  15.1  15.9   Hematocrit 36.0 - 46.0 % 43.5  45.5  49.2   Platelets 150 - 400 K/uL 448  354  520       Latest Ref Rng & Units 08/07/2022   12:50 PM 04/27/2022    1:15 PM 01/16/2022    1:09 PM  CMP  Glucose 70 - 99 mg/dL 78  98  102   BUN 8 -  23 mg/dL '20  16  21   '$ Creatinine 0.44 - 1.00 mg/dL 0.50  0.54  0.63   Sodium 135 - 145 mmol/L 138  137  142   Potassium 3.5 - 5.1 mmol/L 4.0  4.2  4.3   Chloride 98 - 111 mmol/L 103  102  102   CO2 22 - 32 mmol/L 30  29  32   Calcium 8.9 - 10.3 mg/dL 9.4  9.4  9.4   Total Protein 6.5 - 8.1 g/dL 7.1  8.0  7.3   Total Bilirubin 0.3 - 1.2 mg/dL 0.8  0.6  0.9   Alkaline Phos 38 - 126 U/L 82  97  84   AST 15 - 41 U/L '17  19  15   '$ ALT 0 - 44 U/L '12  12  12     '$ DIAGNOSTIC IMAGING:  I have independently reviewed the scans and discussed with the patient. No results found.    WRAP UP:  I discussed the assessment and treatment plan with the patient. The patient was provided an opportunity to ask questions and all were answered. The patient agreed with the plan and demonstrated an understanding of the instructions.   The patient was advised to call or seek an in-person evaluation if the symptoms worsen or if the condition fails to improve as anticipated.  Medical decision making: Moderate  Time spent on visit: I spent 30 minutes in preparation for visit and in counseling patient via virtual video visit.  Harriett Rush, PA-C 11/13/22  7:08 PM

## 2022-11-14 ENCOUNTER — Other Ambulatory Visit: Payer: Self-pay | Admitting: *Deleted

## 2022-11-14 DIAGNOSIS — D473 Essential (hemorrhagic) thrombocythemia: Secondary | ICD-10-CM

## 2022-11-14 MED ORDER — HYDROXYUREA 500 MG PO CAPS: ORAL_CAPSULE | ORAL | 1 refills | Status: DC

## 2022-11-16 DIAGNOSIS — H35453 Secondary pigmentary degeneration, bilateral: Secondary | ICD-10-CM | POA: Diagnosis not present

## 2022-11-16 DIAGNOSIS — H35722 Serous detachment of retinal pigment epithelium, left eye: Secondary | ICD-10-CM | POA: Diagnosis not present

## 2022-11-16 DIAGNOSIS — H3561 Retinal hemorrhage, right eye: Secondary | ICD-10-CM | POA: Diagnosis not present

## 2022-11-16 DIAGNOSIS — Z961 Presence of intraocular lens: Secondary | ICD-10-CM | POA: Diagnosis not present

## 2022-11-16 DIAGNOSIS — D3131 Benign neoplasm of right choroid: Secondary | ICD-10-CM | POA: Diagnosis not present

## 2022-11-16 DIAGNOSIS — H353231 Exudative age-related macular degeneration, bilateral, with active choroidal neovascularization: Secondary | ICD-10-CM | POA: Diagnosis not present

## 2022-11-16 DIAGNOSIS — H35363 Drusen (degenerative) of macula, bilateral: Secondary | ICD-10-CM | POA: Diagnosis not present

## 2022-11-21 ENCOUNTER — Other Ambulatory Visit: Payer: Self-pay | Admitting: Physician Assistant

## 2022-11-21 DIAGNOSIS — C50412 Malignant neoplasm of upper-outer quadrant of left female breast: Secondary | ICD-10-CM

## 2022-11-22 DIAGNOSIS — H353221 Exudative age-related macular degeneration, left eye, with active choroidal neovascularization: Secondary | ICD-10-CM | POA: Diagnosis not present

## 2022-11-23 DIAGNOSIS — J449 Chronic obstructive pulmonary disease, unspecified: Secondary | ICD-10-CM | POA: Diagnosis not present

## 2022-11-23 DIAGNOSIS — Z96652 Presence of left artificial knee joint: Secondary | ICD-10-CM | POA: Diagnosis not present

## 2022-11-23 DIAGNOSIS — J9601 Acute respiratory failure with hypoxia: Secondary | ICD-10-CM | POA: Diagnosis not present

## 2022-11-27 DIAGNOSIS — H353211 Exudative age-related macular degeneration, right eye, with active choroidal neovascularization: Secondary | ICD-10-CM | POA: Diagnosis not present

## 2022-11-28 ENCOUNTER — Telehealth: Payer: Self-pay

## 2022-11-28 ENCOUNTER — Other Ambulatory Visit (HOSPITAL_COMMUNITY): Payer: Self-pay | Admitting: *Deleted

## 2022-11-28 DIAGNOSIS — C50412 Malignant neoplasm of upper-outer quadrant of left female breast: Secondary | ICD-10-CM

## 2022-11-28 MED ORDER — VITAMIN D (ERGOCALCIFEROL) 1.25 MG (50000 UNIT) PO CAPS: 50000.0000 [IU] | ORAL_CAPSULE | ORAL | 3 refills | Status: DC

## 2022-11-28 NOTE — Telephone Encounter (Signed)
Patent stated she did not have any swelling or soreness with her left breast/armpit.  Instructed to call for any changes or problems with understanding verbalized.

## 2022-11-28 NOTE — Telephone Encounter (Signed)
-----  Message from Harriett Rush, [place] sent at 11/27/2022  7:44 PM EST ----- At Ms. Marmo's visit on 11/13/2022, she now some subjective swelling and pain in her left axillary region.  Breast exam by Dr. Delton Coombes was negative for any suspicious mass, although patient did have some mild soreness below her left upper outer quadrant scar.  Ola Raap: Can you please call the patient to see if she is still having symptoms of swelling and pain in her left breast/left armpit/left arm?  If she has persistent symptoms, please let me know so that I can order left breast diagnostic mammogram.  If her symptoms have resolved, she will follow-up as scheduled.   ----- Message ----- From: Harriett Rush, PA-C Sent: 11/27/2022  12:00 AM EST To: Harriett Rush, PA-C  PATIENT NEEDS CALL TO FOLLOW UP ON BREAST SYMPTOMS (from visit on 11/13/2022)  PER NOTE: -Patient reported that for the past month, she felt that there has been some swelling in her left lateral breast and some "pulling pain" under her left axillary region, although she reports there has not been any palpable abnormalities.  -Since I was not present in office for this visit, breast exam was kindly performed by Dr. Delton Coombes, who did not feel any suspicious mass but noted some mild soreness below left upper outer quadrant scar.    - Patient recommended to watch area of tenderness and concern for the next few weeks.  If symptoms have not gone away in 2 weeks, patient advised to call our office so that we can arrange for left breast diagnostic mammogram.

## 2022-12-05 ENCOUNTER — Other Ambulatory Visit: Payer: Self-pay | Admitting: *Deleted

## 2022-12-21 ENCOUNTER — Ambulatory Visit (INDEPENDENT_AMBULATORY_CARE_PROVIDER_SITE_OTHER): Payer: Medicare Other

## 2022-12-21 ENCOUNTER — Ambulatory Visit: Payer: Medicare Other | Admitting: Orthopedic Surgery

## 2022-12-21 ENCOUNTER — Encounter: Payer: Self-pay | Admitting: Orthopedic Surgery

## 2022-12-21 VITALS — BP 147/85 | HR 67 | Ht 64.0 in | Wt 230.0 lb

## 2022-12-21 DIAGNOSIS — M25512 Pain in left shoulder: Secondary | ICD-10-CM | POA: Diagnosis not present

## 2022-12-21 DIAGNOSIS — M542 Cervicalgia: Secondary | ICD-10-CM | POA: Diagnosis not present

## 2022-12-21 DIAGNOSIS — M47812 Spondylosis without myelopathy or radiculopathy, cervical region: Secondary | ICD-10-CM | POA: Diagnosis not present

## 2022-12-21 MED ORDER — METHYLPREDNISOLONE ACETATE 40 MG/ML IJ SUSP
40.0000 mg | Freq: Once | INTRAMUSCULAR | Status: AC
  Administered 2022-12-21: 40 mg via INTRA_ARTICULAR

## 2022-12-21 NOTE — Progress Notes (Signed)
Chief Complaint  Patient presents with   Shoulder Pain    LEFT for a couple weeks goes into neck    Neck Pain   Send is an 82 year old female we have seen in the past she has severe cervical spondylosis she has a history of right shoulder pain treated with injection comes in with left shoulder pain radiating to her left shoulder without any history of trauma.  She does have some nocturnal symptoms with occasional shooting pain.  The pain seems to go from the shoulder to the neck she is not having any numbness or tingling  Examination of the left shoulder shows there is no specific area of tenderness she has normal internal/external rotation when the arm is at her side she has painful forward elevation at 120 degrees consistent with impingement   Imaging shows just a little bit of incongruency between the Shenton's line of the shoulder but nothing to bad  Encounter Diagnoses  Name Primary?   Neck pain    Acute pain of left shoulder Yes    I think she will be fine with the shoulder injection   Procedure note the subacromial injection shoulder left   Verbal consent was obtained to inject the  Left   Shoulder  Timeout was completed to confirm the injection site is a subacromial space of the  left  shoulder  Medication used Depo-Medrol 40 mg and lidocaine 1% 3 cc  Anesthesia was provided by ethyl chloride  The injection was performed in the left  posterior subacromial space. After pinning the skin with alcohol and anesthetized the skin with ethyl chloride the subacromial space was injected using a 20-gauge needle. There were no complications  Sterile dressing was applied.

## 2022-12-21 NOTE — Patient Instructions (Signed)

## 2022-12-24 DIAGNOSIS — J9601 Acute respiratory failure with hypoxia: Secondary | ICD-10-CM | POA: Diagnosis not present

## 2022-12-24 DIAGNOSIS — Z96652 Presence of left artificial knee joint: Secondary | ICD-10-CM | POA: Diagnosis not present

## 2022-12-24 DIAGNOSIS — J449 Chronic obstructive pulmonary disease, unspecified: Secondary | ICD-10-CM | POA: Diagnosis not present

## 2022-12-25 DIAGNOSIS — H353211 Exudative age-related macular degeneration, right eye, with active choroidal neovascularization: Secondary | ICD-10-CM | POA: Diagnosis not present

## 2022-12-25 DIAGNOSIS — H35361 Drusen (degenerative) of macula, right eye: Secondary | ICD-10-CM | POA: Diagnosis not present

## 2022-12-25 DIAGNOSIS — Z961 Presence of intraocular lens: Secondary | ICD-10-CM | POA: Diagnosis not present

## 2022-12-25 DIAGNOSIS — H353221 Exudative age-related macular degeneration, left eye, with active choroidal neovascularization: Secondary | ICD-10-CM | POA: Diagnosis not present

## 2022-12-25 DIAGNOSIS — H3561 Retinal hemorrhage, right eye: Secondary | ICD-10-CM | POA: Diagnosis not present

## 2022-12-25 DIAGNOSIS — H5319 Other subjective visual disturbances: Secondary | ICD-10-CM | POA: Diagnosis not present

## 2023-01-01 DIAGNOSIS — H353211 Exudative age-related macular degeneration, right eye, with active choroidal neovascularization: Secondary | ICD-10-CM | POA: Diagnosis not present

## 2023-01-04 ENCOUNTER — Telehealth: Payer: Self-pay | Admitting: Cardiology

## 2023-01-04 ENCOUNTER — Encounter: Payer: Self-pay | Admitting: Radiology

## 2023-01-04 NOTE — Telephone Encounter (Signed)
Spoke to pt who stated that she has had swelling and a burning sensation in her legs/feet for approx. 3 weeks. Pt stated the burning sensation comes and goes. Pt stated that her legs/feet are not hot to the touch and no redness.  Pt denies sob, cp. Pt has not followed up with PCP, but does have an appt on 3/18.

## 2023-01-04 NOTE — Telephone Encounter (Signed)
Patient stated she will increase lasix to 80 mg for 2-3 days to see if swelling in legs/feet improve. Pt will keep f/u appt with PCP.   Pt will notify office if swelling does not improve with increased lasix dosing.

## 2023-01-04 NOTE — Telephone Encounter (Signed)
Pt c/o medication issue:  1. Name of Medication:   empagliflozin (JARDIANCE) 10 MG TABS tablet    2. How are you currently taking this medication (dosage and times per day)?   3. Are you having a reaction (difficulty breathing--STAT)?   4. What is your medication issue?  Pt states for 3 weeks now her legs have been burning and her feet have been swelling. She denies any other symptoms. Please advise.

## 2023-01-15 DIAGNOSIS — B351 Tinea unguium: Secondary | ICD-10-CM | POA: Diagnosis not present

## 2023-01-15 DIAGNOSIS — D473 Essential (hemorrhagic) thrombocythemia: Secondary | ICD-10-CM | POA: Diagnosis not present

## 2023-01-15 DIAGNOSIS — L608 Other nail disorders: Secondary | ICD-10-CM | POA: Diagnosis not present

## 2023-01-15 DIAGNOSIS — I5030 Unspecified diastolic (congestive) heart failure: Secondary | ICD-10-CM | POA: Diagnosis not present

## 2023-01-15 DIAGNOSIS — I272 Pulmonary hypertension, unspecified: Secondary | ICD-10-CM | POA: Diagnosis not present

## 2023-01-15 DIAGNOSIS — Z79899 Other long term (current) drug therapy: Secondary | ICD-10-CM | POA: Diagnosis not present

## 2023-01-22 DIAGNOSIS — J9601 Acute respiratory failure with hypoxia: Secondary | ICD-10-CM | POA: Diagnosis not present

## 2023-01-22 DIAGNOSIS — J449 Chronic obstructive pulmonary disease, unspecified: Secondary | ICD-10-CM | POA: Diagnosis not present

## 2023-01-22 DIAGNOSIS — Z96652 Presence of left artificial knee joint: Secondary | ICD-10-CM | POA: Diagnosis not present

## 2023-01-22 DIAGNOSIS — I5030 Unspecified diastolic (congestive) heart failure: Secondary | ICD-10-CM | POA: Diagnosis not present

## 2023-01-22 DIAGNOSIS — E785 Hyperlipidemia, unspecified: Secondary | ICD-10-CM | POA: Diagnosis not present

## 2023-01-23 ENCOUNTER — Other Ambulatory Visit: Payer: Self-pay | Admitting: Hematology

## 2023-01-23 ENCOUNTER — Other Ambulatory Visit: Payer: Self-pay | Admitting: Cardiology

## 2023-01-24 DIAGNOSIS — H353221 Exudative age-related macular degeneration, left eye, with active choroidal neovascularization: Secondary | ICD-10-CM | POA: Diagnosis not present

## 2023-02-05 ENCOUNTER — Inpatient Hospital Stay: Payer: Medicare Other | Attending: Hematology

## 2023-02-05 DIAGNOSIS — R5383 Other fatigue: Secondary | ICD-10-CM | POA: Diagnosis not present

## 2023-02-05 DIAGNOSIS — Z8261 Family history of arthritis: Secondary | ICD-10-CM | POA: Diagnosis not present

## 2023-02-05 DIAGNOSIS — E669 Obesity, unspecified: Secondary | ICD-10-CM | POA: Insufficient documentation

## 2023-02-05 DIAGNOSIS — Z8249 Family history of ischemic heart disease and other diseases of the circulatory system: Secondary | ICD-10-CM | POA: Diagnosis not present

## 2023-02-05 DIAGNOSIS — D75839 Thrombocytosis, unspecified: Secondary | ICD-10-CM | POA: Insufficient documentation

## 2023-02-05 DIAGNOSIS — Z90721 Acquired absence of ovaries, unilateral: Secondary | ICD-10-CM | POA: Diagnosis not present

## 2023-02-05 DIAGNOSIS — R252 Cramp and spasm: Secondary | ICD-10-CM | POA: Diagnosis not present

## 2023-02-05 DIAGNOSIS — C50412 Malignant neoplasm of upper-outer quadrant of left female breast: Secondary | ICD-10-CM | POA: Insufficient documentation

## 2023-02-05 DIAGNOSIS — F32A Depression, unspecified: Secondary | ICD-10-CM | POA: Diagnosis not present

## 2023-02-05 DIAGNOSIS — Z882 Allergy status to sulfonamides status: Secondary | ICD-10-CM | POA: Insufficient documentation

## 2023-02-05 DIAGNOSIS — M7989 Other specified soft tissue disorders: Secondary | ICD-10-CM | POA: Insufficient documentation

## 2023-02-05 DIAGNOSIS — Z90710 Acquired absence of both cervix and uterus: Secondary | ICD-10-CM | POA: Diagnosis not present

## 2023-02-05 DIAGNOSIS — D473 Essential (hemorrhagic) thrombocythemia: Secondary | ICD-10-CM

## 2023-02-05 DIAGNOSIS — Z79899 Other long term (current) drug therapy: Secondary | ICD-10-CM | POA: Insufficient documentation

## 2023-02-05 DIAGNOSIS — Z79811 Long term (current) use of aromatase inhibitors: Secondary | ICD-10-CM | POA: Insufficient documentation

## 2023-02-05 DIAGNOSIS — Z8542 Personal history of malignant neoplasm of other parts of uterus: Secondary | ICD-10-CM | POA: Insufficient documentation

## 2023-02-05 DIAGNOSIS — D45 Polycythemia vera: Secondary | ICD-10-CM | POA: Insufficient documentation

## 2023-02-05 DIAGNOSIS — Z170 Estrogen receptor positive status [ER+]: Secondary | ICD-10-CM | POA: Insufficient documentation

## 2023-02-05 DIAGNOSIS — Z8673 Personal history of transient ischemic attack (TIA), and cerebral infarction without residual deficits: Secondary | ICD-10-CM | POA: Insufficient documentation

## 2023-02-05 DIAGNOSIS — R232 Flushing: Secondary | ICD-10-CM | POA: Insufficient documentation

## 2023-02-05 DIAGNOSIS — Z801 Family history of malignant neoplasm of trachea, bronchus and lung: Secondary | ICD-10-CM | POA: Insufficient documentation

## 2023-02-05 LAB — CBC WITH DIFFERENTIAL/PLATELET
Abs Immature Granulocytes: 0.01 10*3/uL (ref 0.00–0.07)
Basophils Absolute: 0.1 10*3/uL (ref 0.0–0.1)
Basophils Relative: 1 %
Eosinophils Absolute: 0.2 10*3/uL (ref 0.0–0.5)
Eosinophils Relative: 4 %
HCT: 48.2 % — ABNORMAL HIGH (ref 36.0–46.0)
Hemoglobin: 15.7 g/dL — ABNORMAL HIGH (ref 12.0–15.0)
Immature Granulocytes: 0 %
Lymphocytes Relative: 21 %
Lymphs Abs: 1.4 10*3/uL (ref 0.7–4.0)
MCH: 34.4 pg — ABNORMAL HIGH (ref 26.0–34.0)
MCHC: 32.6 g/dL (ref 30.0–36.0)
MCV: 105.5 fL — ABNORMAL HIGH (ref 80.0–100.0)
Monocytes Absolute: 0.7 10*3/uL (ref 0.1–1.0)
Monocytes Relative: 10 %
Neutro Abs: 4.4 10*3/uL (ref 1.7–7.7)
Neutrophils Relative %: 64 %
Platelets: 364 10*3/uL (ref 150–400)
RBC: 4.57 MIL/uL (ref 3.87–5.11)
RDW: 15.9 % — ABNORMAL HIGH (ref 11.5–15.5)
WBC: 6.8 10*3/uL (ref 4.0–10.5)
nRBC: 0 % (ref 0.0–0.2)

## 2023-02-05 LAB — COMPREHENSIVE METABOLIC PANEL
ALT: 10 U/L (ref 0–44)
AST: 16 U/L (ref 15–41)
Albumin: 3.7 g/dL (ref 3.5–5.0)
Alkaline Phosphatase: 88 U/L (ref 38–126)
Anion gap: 9 (ref 5–15)
BUN: 23 mg/dL (ref 8–23)
CO2: 28 mmol/L (ref 22–32)
Calcium: 9.1 mg/dL (ref 8.9–10.3)
Chloride: 101 mmol/L (ref 98–111)
Creatinine, Ser: 0.62 mg/dL (ref 0.44–1.00)
GFR, Estimated: >60 mL/min (ref >60)
Glucose, Bld: 94 mg/dL (ref 70–99)
Potassium: 4.1 mmol/L (ref 3.5–5.1)
Sodium: 138 mmol/L (ref 135–145)
Total Bilirubin: 1.1 mg/dL (ref 0.3–1.2)
Total Protein: 7.4 g/dL (ref 6.5–8.1)

## 2023-02-05 LAB — VITAMIN D 25 HYDROXY (VIT D DEFICIENCY, FRACTURES): Vit D, 25-Hydroxy: 62.21 ng/mL (ref 30–100)

## 2023-02-05 LAB — LACTATE DEHYDROGENASE: LDH: 136 U/L (ref 98–192)

## 2023-02-07 DIAGNOSIS — H353211 Exudative age-related macular degeneration, right eye, with active choroidal neovascularization: Secondary | ICD-10-CM | POA: Diagnosis not present

## 2023-02-12 ENCOUNTER — Inpatient Hospital Stay: Payer: Medicare Other | Admitting: Physician Assistant

## 2023-02-12 VITALS — BP 133/74 | HR 64 | Temp 98.3°F | Resp 18 | Ht 64.0 in | Wt 229.2 lb

## 2023-02-12 DIAGNOSIS — Z8249 Family history of ischemic heart disease and other diseases of the circulatory system: Secondary | ICD-10-CM | POA: Diagnosis not present

## 2023-02-12 DIAGNOSIS — R5383 Other fatigue: Secondary | ICD-10-CM | POA: Diagnosis not present

## 2023-02-12 DIAGNOSIS — Z79811 Long term (current) use of aromatase inhibitors: Secondary | ICD-10-CM | POA: Diagnosis not present

## 2023-02-12 DIAGNOSIS — C50412 Malignant neoplasm of upper-outer quadrant of left female breast: Secondary | ICD-10-CM | POA: Diagnosis not present

## 2023-02-12 DIAGNOSIS — R232 Flushing: Secondary | ICD-10-CM | POA: Diagnosis not present

## 2023-02-12 DIAGNOSIS — Z8673 Personal history of transient ischemic attack (TIA), and cerebral infarction without residual deficits: Secondary | ICD-10-CM | POA: Diagnosis not present

## 2023-02-12 DIAGNOSIS — M7989 Other specified soft tissue disorders: Secondary | ICD-10-CM | POA: Diagnosis not present

## 2023-02-12 DIAGNOSIS — D45 Polycythemia vera: Secondary | ICD-10-CM | POA: Diagnosis not present

## 2023-02-12 DIAGNOSIS — Z882 Allergy status to sulfonamides status: Secondary | ICD-10-CM | POA: Diagnosis not present

## 2023-02-12 DIAGNOSIS — R252 Cramp and spasm: Secondary | ICD-10-CM | POA: Diagnosis not present

## 2023-02-12 DIAGNOSIS — F32A Depression, unspecified: Secondary | ICD-10-CM | POA: Diagnosis not present

## 2023-02-12 DIAGNOSIS — Z8261 Family history of arthritis: Secondary | ICD-10-CM | POA: Diagnosis not present

## 2023-02-12 DIAGNOSIS — D473 Essential (hemorrhagic) thrombocythemia: Secondary | ICD-10-CM

## 2023-02-12 DIAGNOSIS — Z170 Estrogen receptor positive status [ER+]: Secondary | ICD-10-CM

## 2023-02-12 DIAGNOSIS — Z801 Family history of malignant neoplasm of trachea, bronchus and lung: Secondary | ICD-10-CM | POA: Diagnosis not present

## 2023-02-12 DIAGNOSIS — D75839 Thrombocytosis, unspecified: Secondary | ICD-10-CM | POA: Diagnosis not present

## 2023-02-12 DIAGNOSIS — Z79899 Other long term (current) drug therapy: Secondary | ICD-10-CM | POA: Diagnosis not present

## 2023-02-12 MED ORDER — HYDROXYUREA 500 MG PO CAPS: 1000.0000 mg | ORAL_CAPSULE | Freq: Every day | ORAL | 2 refills | Status: DC

## 2023-02-12 NOTE — Progress Notes (Signed)
[facility] [address], [place] [address]   CLINIC:  Medical Oncology/Hematology  PCP:  Lori Perches, MD [address] / [place] [address] [phone]   REASON FOR VISIT:  - History of left-sided breast cancer (September 2018) - JAK2 + polycythemia vera/essential thrombocytosis   PRIOR THERAPY: Left-sided lumpectomy with adjuvant XRT   CURRENT THERAPY: - Letrozole daily since January 2019 - Hydrea [current dose 2 tablets (1000 mg) M/W/F, 1 tablet (500 mg) T/T/S/S]  BRIEF ONCOLOGIC HISTORY:   Oncology History  Malignant neoplasm of upper-outer quadrant of left breast in female, estrogen receptor positive  07/10/2017 Surgery   Left lumpectomy: IDC grade 2, 1.3 cm, intermediate grade DCIS, margins negative, 1/2 lymph nodes positive, ER 5% positive weak staining, PR 0%, HER-2 negative ratio 1.73, Ki-67 15%, T1cN0 stage IA AJCC 8   07/10/2017 Miscellaneous   Mammaprint: Low risk, 10-year risk of recurrence untreated 10%   10/01/2017 - 11/14/2017 Radiation Therapy   Adj XRT   11/2017 -  Anti-estrogen oral therapy   Letrozole daily     CANCER STAGING: Cancer Staging  Malignant neoplasm of upper-outer quadrant of left breast in female, estrogen receptor positive Staging form: Breast, AJCC 8th Edition - Clinical stage from 05/22/2017: Stage IA (cT1, cN0, cM0, G1, ER+, PR-, HER2-) - Signed by Hubbard Hartshorn, NP on 06/22/2017 - Pathologic: Stage IIA (pT1c, pN1a, cM0, G2, ER+, PR-, HER2-) - Unsigned   INTERVAL HISTORY:   [name], a 82 y.o. female, returns for routine follow-up of her JAK2 PV/ET and history of left-sided breast cancer. [name] was last seen via telemedicine visit by Rojelio Brenner PA-C on 11/13/2022.   At today's visit, she  reports feeling fairly well.  She is currently taking Hydrea 1000 mg Monday through Friday, and 500 mg on Saturday and Sunday.  She is tolerating her Hydrea well without any skin sores, mouth  sores, or GI side effects.  Energy has been at baseline.  She denies any interval vasomotor symptoms or thrombotic events. She has leg swelling that is equal bilaterally.  Her previously reported left breast tenderness and "pulling pain" has not happened again since she was last seen in January 2024.  She denies any breast lumps, new-onset bone pain, B symptoms, or neurologic changes.  She is tolerating letrozole fairly well with in occasional hot flashes and leg cramps.  She reports 70% energy and 85% appetite.  She is maintaining stable weight at this time.   ASSESSMENT & PLAN:  1.  JAK2+ essential thrombocytosis versus polycythemia vera - JAK2 V617F mutation detected in 2015 - No prior history of thrombosis - Hydroxyurea was started in November 2019 - Current dose of Hydrea is 500 mg MWF and 1000 mg TTSS - No vasomotor symptoms, erythromelalgia, or aquagenic pruritus     - She is taking aspirin 81 mg daily. - Most recent labs (02/05/2023) show Hgb 15.7/hematocrit 48.2 %/MCV 105.5, platelets 364, normal WBC 6.8, normal differential.  CMP normal.  LDH normal. - Platelets are the primarily affected cell line, but she does have intermittent elevations in Hgb/HCT, which could indicate polycythemia vera rather than essential thrombocytosis.  No change in management or monitoring strategy.  Goal remains platelets <400 and HCT <45.0%. - PLAN: We will INCREASE her Hydrea to 1000 mg daily - Labs only (CBC/D) in 1 month - Labs and follow-up in 2 months - Continue aspirin 81 mg daily - Patient is aware of alarm symptoms that  would prompt more immediate medical attention.   2.  T1CN1A (stage IIa) malignant neoplasm of upper-outer quadrant of left breast - Left breast biopsy on 05/29/2017 of the upper outer quadrant mass - IDC, grade 1/2.  ER 5%, PR 0%, Ki-67 15%, HER-2 negative. - Left breast lumpectomy on 07/10/2017, IDC, grade 2, 1.3 cm, margins negative, 1/2 lymph nodes positive, pT1c, PN 1A -  MammaPrint-low risk luminal A, average 10-year risk of recurrence untreated 10%. - Adjuvant XRT from 10/01/2017-11/15/2017. - Letrozole 1 mg daily started on 11/15/2017. - BCI testing (October 2023) showed that patient would likely benefit from extended endocrine therapy.  (8.5% risk of late distant recurrence after 5 years of adjuvant endocrine therapy, compared to 2.8% - 3.6% risk of late distant recurrence after 10 total years of adjuvant endocrine therapy) - She is tolerating LETROZOLE with occasional hot flashes and leg cramps - Reviewed mammogram from 05/25/2022, BI-RADS Category 1, negative.  No mammographic evidence of malignancy in either breast. -  Physical exam by Dr. Ellin SabaKatragadda in January 2024, no suspicious mass, but some mild soreness below left upper outer quadrant scar.   - ROS did not reveal any red flag symptoms of recurrence    - Most recent labs show baseline CBC and normal CMP - PLAN: Continue letrozole.  Goal of treatment is 10 years (through January 2029) based on BCI results. -- Next mammogram due July 2024.   - Breast exam annually and as needed, next due at visit in July 2024  (deferred today)   3.  Bone health - Bone density scan (05/24/2021): T score -1.0, normal - Most recent vitamin D (08/07/2022) normal at 80.66 - Taking vitamin D 50,000 units every other week - PLAN: In the setting of aromatase inhibitor use, we will continue to check bone density scan every 2 to 3 years, next due July 2024.  Will check vitamin D with next labs   4.  Social/family history: - She was recently widowed in February 2022.  She is living independently.  She works part-time for 6 hours a day, office job.  She is non-smoker. - Father had lung cancer.  PLAN SUMMARY: >> Labs only in 1 month = CBC/D (recheck blood counts after Hydrea dose change) >> Labs in 2 months = CBC/D, CMP, LDH >> OFFICE visit in 2 months (after labs)  ** After next visit, we will order... >> Mammogram July 2024 >>  Bone density DEXA July 2024    REVIEW OF SYSTEMS:   Review of Systems  Constitutional:  Positive for fatigue. Negative for appetite change, chills, diaphoresis, fever and unexpected weight change.  HENT:   Negative for lump/mass and nosebleeds.   Eyes:  Negative for eye problems.  Respiratory:  Negative for cough, hemoptysis and shortness of breath.   Cardiovascular:  Negative for chest pain, leg swelling and palpitations.  Gastrointestinal:  Negative for abdominal pain, blood in stool, constipation, diarrhea, nausea and vomiting.  Genitourinary:  Negative for hematuria.   Skin: Negative.   Neurological:  Negative for dizziness, headaches and light-headedness.  Hematological:  Does not bruise/bleed easily.  Psychiatric/Behavioral:  Positive for depression. The patient is nervous/anxious.     PHYSICAL EXAM:   Performance status (ECOG): 3 - Symptomatic, >50% confined to bed  There were no vitals filed for this visit. Wt Readings from Last 3 Encounters:  12/21/22 230 lb (104.3 kg)  11/13/22 233 lb 7.5 oz (105.9 kg)  10/04/22 236 lb (107 kg)   Physical Exam Constitutional:  Appearance: Normal appearance. She is obese.  Cardiovascular:     Heart sounds: Murmur heard.  Pulmonary:     Breath sounds: Normal breath sounds.  Musculoskeletal:     Right lower leg: Edema present.     Left lower leg: Edema present.  Neurological:     General: No focal deficit present.     Mental Status: Mental status is at baseline.  Psychiatric:        Behavior: Behavior normal. Behavior is cooperative.      PAST MEDICAL/SURGICAL HISTORY:  Past Medical History:  Diagnosis Date   Arthritis    Chronic diastolic heart failure (HCC)    Essential hypertension    GERD (gastroesophageal reflux disease)    Gout    Hiatal hernia    History of endometrial cancer    Sp hysterectomy 2012   Ischemic colitis (HCC) 2016   Myeloproliferative disorder, JAK-2 positive    Obesity    Pneumonia     Polycythemia vera(238.4)    TIA (transient ischemic attack)    Past Surgical History:  Procedure Laterality Date   ABDOMINAL HYSTERECTOMY  09/12/2011   Procedure: HYSTERECTOMY ABDOMINAL;  Surgeon: Laurette Schimke, MD;  Location: [facility];  Service: Gynecology;  Laterality: N/A;  Total Abdominal Hysterectomy, Bilateral Salpingo Oophorectomy   BREAST LUMPECTOMY Left 07/10/2017   LEFT BREAST LUMPECTOMY WITH RADIOACTIVE SEED AND SENTINEL LYMPH NODE BIOPSY    BREAST LUMPECTOMY WITH RADIOACTIVE SEED AND SENTINEL LYMPH NODE BIOPSY Left 07/10/2017   Procedure: LEFT BREAST LUMPECTOMY WITH RADIOACTIVE SEED AND SENTINEL LYMPH NODE BIOPSY;  Surgeon: Emelia Loron, MD;  Location: [facility] OR;  Service: General;  Laterality: Left;   COLONOSCOPY  07/05/2012   Procedure: COLONOSCOPY;  Surgeon: Malissa Hippo, MD;  Location: [facility];  Service: Endoscopy;  Laterality: N/A;  730   EYE SURGERY     bilateral 8 - 12 yrs ago   FLEXIBLE SIGMOIDOSCOPY N/A 04/03/2015   Procedure: FLEXIBLE SIGMOIDOSCOPY;  Surgeon: Malissa Hippo, MD;  Location: [facility];  Service: Endoscopy;  Laterality: N/A;   HYSTEROSCOPY WITH D & C  08/15/2011   Procedure: DILATATION AND CURETTAGE (D&C) /HYSTEROSCOPY;  Surgeon: Tilda Burrow, MD;  Location: [facility];  Service: Gynecology;  Laterality: N/A;  With Suction Curette   JOINT REPLACEMENT     left knee replaced 2014   KNEE ARTHROPLASTY Left 03/15/2015   Procedure: COMPUTER ASSISTED TOTAL KNEE ARTHROPLASTY;  Surgeon: Eldred Manges, MD;  Location: [facility];  Service: Orthopedics;  Laterality: Left;   SALPINGOOPHORECTOMY  09/12/2011   Procedure: SALPINGO OOPHERECTOMY;  Surgeon: Laurette Schimke, MD;  Location: [facility];  Service: Gynecology;  Laterality: Bilateral;    SOCIAL HISTORY:  Social History   Socioeconomic History   Marital status: Widowed    Spouse name: Not on file   Number of children: 2   Years of education: 12   Highest education level: High school graduate  Occupational  History   Not on file  Tobacco Use   Smoking status: Never    Passive exposure: Never   Smokeless tobacco: Never  Vaping Use   Vaping Use: Never used  Substance and Sexual Activity   Alcohol use: No   Drug use: No   Sexual activity: Not Currently    Partners: Male    Birth control/protection: Surgical    Comment: hysterectomy  Other Topics Concern   Not on file  Social History Narrative   Not on file   Social Determinants of Health  Financial Resource Strain: Low Risk  (09/11/2022)   Overall Financial Resource Strain (CARDIA)    Difficulty of Paying Living Expenses: Not hard at all  Food Insecurity: No Food Insecurity (09/11/2022)   Hunger Vital Sign    Worried About Running Out of Food in the Last Year: Never true    Ran Out of Food in the Last Year: Never true  Transportation Needs: No Transportation Needs (09/11/2022)   PRAPARE - Administrator, Civil Service (Medical): No    Lack of Transportation (Non-Medical): No  Physical Activity: Sufficiently Active (09/11/2022)   Exercise Vital Sign    Days of Exercise per Week: 5 days    Minutes of Exercise per Session: 30 min  Stress: No Stress Concern Present (09/11/2022)   [employer] of Occupational Health - Occupational Stress Questionnaire    Feeling of Stress : Only a little  Social Connections: Moderately Integrated (09/11/2022)   Social Connection and Isolation Panel [NHANES]    Frequency of Communication with Friends and Family: More than three times a week    Frequency of Social Gatherings with Friends and Family: More than three times a week    Attends Religious Services: More than 4 times per year    Active Member of [employer] or Organizations: Yes    Attends Banker Meetings: More than 4 times per year    Marital Status: Widowed  Intimate Partner Violence: Not At Risk (09/11/2022)   Humiliation, Afraid, Rape, and Kick questionnaire    Fear of Current or Ex-Partner: No    Emotionally  Abused: No    Physically Abused: No    Sexually Abused: No    FAMILY HISTORY:  Family History  Problem Relation Age of Onset   Hypertension Mother    Lung cancer Father    Arthritis/Rheumatoid Sister    Anesthesia problems Neg Hx    Hypotension Neg Hx    Malignant hyperthermia Neg Hx    Pseudochol deficiency Neg Hx     CURRENT MEDICATIONS:  Current Outpatient Medications  Medication Sig Dispense Refill   aspirin EC 81 MG tablet Take 81 mg by mouth at bedtime.      carvedilol (COREG) 12.5 MG tablet Take 12.5 mg by mouth 2 (two) times daily with a meal. Take 1/2 tablet by 2 times daily     cloNIDine (CATAPRES) 0.3 MG tablet Take 0.15 mg by mouth 2 (two) times daily.     diltiazem (CARDIZEM CD) 180 MG 24 hr capsule Take 180 mg by mouth daily.      doxylamine, Sleep, (SLEEP AID) 25 MG tablet Take 25 mg by mouth at bedtime as needed.     empagliflozin (JARDIANCE) 10 MG TABS tablet Take 1 tablet (10 mg total) by mouth daily before breakfast. 30 tablet 6   esomeprazole (NEXIUM) 40 MG capsule Take 1 capsule (40 mg total) by mouth every morning. 30 capsule 11   furosemide (LASIX) 40 MG tablet Take 1.5 tablets (60 mg total) by mouth daily. 135 tablet 3   hydroxyurea (HYDREA) 500 MG capsule Take 1 capsule (500 mg total) by mouth every Monday, Wednesday, and Friday AND 2 capsules (1,000 mg total) every Tuesday, Thursday, Saturday, and Sunday. 135 capsule 1   ibuprofen (ADVIL) 800 MG tablet Take 800 mg by mouth every 6 (six) hours as needed.     isosorbide mononitrate (IMDUR) 30 MG 24 hr tablet TAKE 1/2 (ONE-HALF) TABLET BY MOUTH AT BEDTIME 45 tablet 1   letrozole [facility])  2.5 MG tablet Take 1 tablet by mouth once daily 90 tablet 0   losartan (COZAAR) 100 MG tablet Take 100 mg by mouth every evening.      Multiple Vitamins-Minerals (PRESERVISION AREDS PO) Take by mouth.     Simethicone (GAS RELIEF PO) Take 1 tablet by mouth daily.     simvastatin (ZOCOR) 10 MG tablet Take 10 mg by mouth every  evening.      Vitamin D, Ergocalciferol, (DRISDOL) 1.25 MG (50000 UNIT) CAPS capsule Take 1 capsule (50,000 Units total) by mouth every 14 (fourteen) days. 6 capsule 3   No current facility-administered medications for this visit.    ALLERGIES:  Allergies  Allergen Reactions   Carbamazepine Hives and Other (See Comments)    headache   Sulfa Antibiotics     Rash    LABORATORY DATA:  I have reviewed the labs as listed.     Latest Ref Rng & Units 02/05/2023    1:08 PM 08/07/2022   12:50 PM 07/03/2022    1:15 PM  CBC  WBC 4.0 - 10.5 K/uL 6.8  7.1  6.8   Hemoglobin 12.0 - 15.0 g/dL 41.9  62.2  29.7   Hematocrit 36.0 - 46.0 % 48.2  43.5  45.5   Platelets 150 - 400 K/uL 364  448  354       Latest Ref Rng & Units 02/05/2023    1:08 PM 08/07/2022   12:50 PM 04/27/2022    1:15 PM  CMP  Glucose 70 - 99 mg/dL 94  78  98   BUN 8 - 23 mg/dL 23  20  16    Creatinine 0.44 - 1.00 mg/dL 9.89  2.11  9.41   Sodium 135 - 145 mmol/L 138  138  137   Potassium 3.5 - 5.1 mmol/L 4.1  4.0  4.2   Chloride 98 - 111 mmol/L 101  103  102   CO2 22 - 32 mmol/L 28  30  29    Calcium 8.9 - 10.3 mg/dL 9.1  9.4  9.4   Total Protein 6.5 - 8.1 g/dL 7.4  7.1  8.0   Total Bilirubin 0.3 - 1.2 mg/dL 1.1  0.8  0.6   Alkaline Phos 38 - 126 U/L 88  82  97   AST 15 - 41 U/L 16  17  19    ALT 0 - 44 U/L 10  12  12      DIAGNOSTIC IMAGING:  I have independently reviewed the scans and discussed with the patient. No results found.   WRAP UP:  All questions were answered. The patient knows to call the clinic with any problems, questions or concerns.  Medical decision making: Moderate  Time spent on visit: I spent 20 minutes counseling the patient face to face. The total time spent in the appointment was 30 minutes and more than 50% was on counseling.  Carnella Guadalajara, PA-C  02/12/23 4:47 PM

## 2023-02-12 NOTE — Patient Instructions (Signed)
[facility] at [facility] Discharge Instructions  You were seen today by Rojelio Brenner PA-C for the following conditions.  HISTORY OF LEFT BREAST CANCER: - Continue to take letrozole (Femara) as prescribed for your breast cancer.  If this is causing side effects of hot flashes, start taking it in the morning instead of at night.  If this does not help with your hot flashes, let us know and we can prescribe additional medication to decrease her hot flashes. - We will schedule bone density scan and mammogram after your next follow-up visit in 2 months.  ESSENTIAL THROMBOCYTOSIS: Your platelets and blood count are slightly higher than their target range.   We will INCREASE your Hydrea so that you are taking 1000 mg (2 tablets) once daily. We will check labs again in 1 month. We will check labs and see you for office visit again in 2 months. Continue aspirin 81 mg daily.  MEDICATIONS: - Continue hydroxyurea (Hydrea) as above - Continue vitamin D once per week.  FOLLOW-UP APPOINTMENT: Office visit in 2 months, after labs   Thank you for choosing [facility] at [facility] to provide your oncology and hematology care.  To afford each patient quality time with our provider, please arrive at least 15 minutes before your scheduled appointment time.   If you have a lab appointment with the Cancer Center please come in thru the Main Entrance and check in at the main information desk.  You need to re-schedule your appointment should you arrive 10 or more minutes late.  We strive to give you quality time with our providers, and arriving late affects you and other patients whose appointments are after yours.  Also, if you no show three or more times for appointments you may be dismissed from the clinic at the providers discretion.     Again, thank you for choosing [facility].  Our hope is that these requests will decrease the amount of time  that you wait before being seen by our physicians.       _____________________________________________________________  Should you have questions after your visit to [facility], please contact our office at [phone] and follow the prompts.  Our office hours are 8:00 a.m. and 4:30 p.m. Monday - Friday.  Please note that voicemails left after 4:00 p.m. may not be returned until the following business day.  We are closed weekends and major holidays.  You do have access to a nurse 24-7, just call the main number to the clinic [phone] and do not press any options, hold on the line and a nurse will answer the phone.    For prescription refill requests, have your pharmacy contact our office and allow 72 hours.    Due to Covid, you will need to wear a mask upon entering the hospital. If you do not have a mask, a mask will be given to you at the Main Entrance upon arrival. For doctor visits, patients may have 1 support person age 6 or older with them. For treatment visits, patients can not have anyone with them due to social distancing guidelines and our immunocompromised population.

## 2023-02-13 ENCOUNTER — Other Ambulatory Visit: Payer: Self-pay

## 2023-02-13 DIAGNOSIS — Z170 Estrogen receptor positive status [ER+]: Secondary | ICD-10-CM

## 2023-02-13 DIAGNOSIS — D473 Essential (hemorrhagic) thrombocythemia: Secondary | ICD-10-CM

## 2023-02-19 DIAGNOSIS — Z961 Presence of intraocular lens: Secondary | ICD-10-CM | POA: Diagnosis not present

## 2023-02-19 DIAGNOSIS — H35453 Secondary pigmentary degeneration, bilateral: Secondary | ICD-10-CM | POA: Diagnosis not present

## 2023-02-19 DIAGNOSIS — H35363 Drusen (degenerative) of macula, bilateral: Secondary | ICD-10-CM | POA: Diagnosis not present

## 2023-02-19 DIAGNOSIS — H3561 Retinal hemorrhage, right eye: Secondary | ICD-10-CM | POA: Diagnosis not present

## 2023-02-19 DIAGNOSIS — H353231 Exudative age-related macular degeneration, bilateral, with active choroidal neovascularization: Secondary | ICD-10-CM | POA: Diagnosis not present

## 2023-02-22 DIAGNOSIS — J449 Chronic obstructive pulmonary disease, unspecified: Secondary | ICD-10-CM | POA: Diagnosis not present

## 2023-02-22 DIAGNOSIS — J9601 Acute respiratory failure with hypoxia: Secondary | ICD-10-CM | POA: Diagnosis not present

## 2023-02-22 DIAGNOSIS — Z96652 Presence of left artificial knee joint: Secondary | ICD-10-CM | POA: Diagnosis not present

## 2023-02-26 DIAGNOSIS — H353221 Exudative age-related macular degeneration, left eye, with active choroidal neovascularization: Secondary | ICD-10-CM | POA: Diagnosis not present

## 2023-03-12 DIAGNOSIS — H353211 Exudative age-related macular degeneration, right eye, with active choroidal neovascularization: Secondary | ICD-10-CM | POA: Diagnosis not present

## 2023-03-14 ENCOUNTER — Inpatient Hospital Stay: Payer: Medicare Other | Attending: Hematology

## 2023-03-14 DIAGNOSIS — Z170 Estrogen receptor positive status [ER+]: Secondary | ICD-10-CM | POA: Diagnosis not present

## 2023-03-14 DIAGNOSIS — D473 Essential (hemorrhagic) thrombocythemia: Secondary | ICD-10-CM | POA: Insufficient documentation

## 2023-03-14 DIAGNOSIS — C50412 Malignant neoplasm of upper-outer quadrant of left female breast: Secondary | ICD-10-CM | POA: Insufficient documentation

## 2023-03-14 LAB — CBC WITH DIFFERENTIAL/PLATELET
Abs Immature Granulocytes: 0.02 10*3/uL (ref 0.00–0.07)
Basophils Absolute: 0.1 10*3/uL (ref 0.0–0.1)
Basophils Relative: 1 %
Eosinophils Absolute: 0.3 10*3/uL (ref 0.0–0.5)
Eosinophils Relative: 4 %
HCT: 50.2 % — ABNORMAL HIGH (ref 36.0–46.0)
Hemoglobin: 16.5 g/dL — ABNORMAL HIGH (ref 12.0–15.0)
Immature Granulocytes: 0 %
Lymphocytes Relative: 25 %
Lymphs Abs: 1.6 10*3/uL (ref 0.7–4.0)
MCH: 35.4 pg — ABNORMAL HIGH (ref 26.0–34.0)
MCHC: 32.9 g/dL (ref 30.0–36.0)
MCV: 107.7 fL — ABNORMAL HIGH (ref 80.0–100.0)
Monocytes Absolute: 0.7 10*3/uL (ref 0.1–1.0)
Monocytes Relative: 10 %
Neutro Abs: 3.9 10*3/uL (ref 1.7–7.7)
Neutrophils Relative %: 60 %
Platelets: 300 10*3/uL (ref 150–400)
RBC: 4.66 MIL/uL (ref 3.87–5.11)
RDW: 15.3 % (ref 11.5–15.5)
WBC: 6.6 10*3/uL (ref 4.0–10.5)
nRBC: 0 % (ref 0.0–0.2)

## 2023-03-15 ENCOUNTER — Other Ambulatory Visit: Payer: Self-pay | Admitting: Physician Assistant

## 2023-03-15 ENCOUNTER — Telehealth: Payer: Self-pay

## 2023-03-15 DIAGNOSIS — D473 Essential (hemorrhagic) thrombocythemia: Secondary | ICD-10-CM

## 2023-03-15 MED ORDER — HYDROXYUREA 500 MG PO CAPS
1500.0000 mg | ORAL_CAPSULE | Freq: Every day | ORAL | 2 refills | Status: DC
Start: 2023-03-15 — End: 2023-04-24

## 2023-03-15 NOTE — Telephone Encounter (Signed)
Spoke with the patient and she is not having any problems with Hydrea 1000mg .   Reviewed instructions for increasing the Hydrea per providers note.   Instructed the patient if she starts to experience any symptoms mentioned to call the CC and her provider will be made aware.  Patient verbalized understanding.

## 2023-03-15 NOTE — Progress Notes (Addendum)
Patient has been taking Hydrea 1000 mg daily x 1 month.  Per RN telephone note, she is tolerating this well but continues to have elevated hemoglobin and hematocrit.  Patient instructed to start taking Hydrea 1500 mg daily.  New prescription sent to pharmacy.  She is scheduled for repeat labs and office visit in 1 month.  Carnella Guadalajara, PA-C 03/15/23 11:13 AM

## 2023-03-15 NOTE — Telephone Encounter (Signed)
-----   Message from Carnella Guadalajara, [place] sent at 03/14/2023  3:37 PM EDT ----- [name]: Can you please call patient and confirm that she is taking increased dose of Hydrea (1000 mg daily)? - If she confirms that she is taking Hydrea 1000 mg (2 tablets) daily and is tolerating this well (no increased fatigue, mouth sores, skin sores, or GI symptoms), then she will need to increase to Hydrea 1500 mg (3 tablets) daily. - If she is having the above side effects, let me know and I will address them with her.

## 2023-03-20 ENCOUNTER — Ambulatory Visit: Payer: Medicare Other | Attending: Cardiology | Admitting: Cardiology

## 2023-03-20 ENCOUNTER — Encounter: Payer: Self-pay | Admitting: Cardiology

## 2023-03-20 VITALS — BP 146/78 | HR 70 | Ht 64.0 in | Wt 234.4 lb

## 2023-03-20 DIAGNOSIS — I350 Nonrheumatic aortic (valve) stenosis: Secondary | ICD-10-CM

## 2023-03-20 DIAGNOSIS — I890 Lymphedema, not elsewhere classified: Secondary | ICD-10-CM

## 2023-03-20 DIAGNOSIS — I272 Pulmonary hypertension, unspecified: Secondary | ICD-10-CM | POA: Diagnosis not present

## 2023-03-20 NOTE — Progress Notes (Signed)
Cardiology Office Note  Date: 03/20/2023   ID: [name], DOB 1941/07/25, MRN [id number]  History of Present Illness: [name] is an 82 y.o. female last seen in November 2023.  She is here today with her daughter for a follow-up visit.  No overall major change in status.  She did have some difficulty recently with hypoxia, although ultimately it was due to malfunctioning of her oxygen delivery device and this has been replaced.  She still uses oxygen at nighttime on a regular basis.  I reviewed her medications.  She remains on Lasix 60 mg daily along with Jardiance 10 mg daily from the perspective of diastolic dysfunction.  We discussed updating her echocardiogram in October of this year with clinical follow-up at that time.  She still follows with Dr. Ouida Sills otherwise.  Physical Exam: VS:  BP (!) 146/78   Pulse 70   Ht 5\' 4"  (1.626 m)   Wt 234 lb 6.4 oz (106.3 kg)   SpO2 94%   BMI 40.23 kg/m , BMI Body mass index is 40.23 kg/m.  Wt Readings from Last 3 Encounters:  03/20/23 234 lb 6.4 oz (106.3 kg)  02/12/23 229 lb 3.2 oz (104 kg)  12/21/22 230 lb (104.3 kg)    General: Patient appears comfortable at rest. HEENT: Conjunctiva and lids normal. Neck: Supple, no elevated JVP or carotid bruits. Lungs: Clear to auscultation, nonlabored breathing at rest. Cardiac: Regular rate and rhythm, no S3, 3/6 systolic murmur. Extremities: Chronic appearing lymphedema.  ECG:  An ECG dated 08/25/2022 was personally reviewed today and demonstrated:  Sinus rhythm with right bundle branch block.  Labwork: March 2024: Cholesterol 157, triglycerides 104, HDL 54, LDL 84 02/05/2023: ALT 10; AST 16; BUN 23; Creatinine, Ser 0.62; Potassium 4.1; Sodium 138 03/14/2023: Hemoglobin 16.5; Platelets 300   Other Studies Reviewed Today:  Echocardiogram 08/21/2022:  1. Left ventricular ejection fraction, by estimation, is 60 to 65%. The  left ventricle has normal function. The left ventricle  has no regional  wall motion abnormalities. There is moderate asymmetric left ventricular  hypertrophy of the septal segment.  Left ventricular diastolic parameters are consistent with Grade I  diastolic dysfunction (impaired relaxation). The average left ventricular  global longitudinal strain is -19.2 %. The global longitudinal strain is  normal.   2. Right ventricular systolic function is normal. The right ventricular  size is normal. There is severely elevated pulmonary artery systolic  pressure. The estimated right ventricular systolic pressure is 65.4 mmHg.   3. Left atrial size was severely dilated.   4. The mitral valve is abnormal. Mild mitral valve regurgitation.  Moderate mitral annular calcification.   5. Tricuspid valve regurgitation is mild to moderate.   6. The aortic valve is tricuspid. Aortic valve regurgitation is not  visualized. Mild aortic valve stenosis. Aortic valve area, by VTI measures  1.58 cm. Aortic valve mean gradient measures 9.3 mmHg.   7. Aortic dilatation noted. There is borderline dilatation of the aortic  root, measuring 39 mm.   8. The inferior vena cava is dilated in size with <50% respiratory  variability, suggesting right atrial pressure of 15 mmHg.   Assessment and Plan:  1.  History of severe pulmonary hypertension, most likely WHO group 2 and 3 based on workup so far.  Plan is medical therapy at this point following previous discussions.  Echocardiogram from October 2023 revealed normal RV contraction with estimated RVSP 65 mmHg.  Continue Jardiance and Lasix, plan to update  echocardiogram in October of this year with clinical visit.  2.  Ischemic heart disease based on prior noninvasive cardiac imaging.  She does not report any active angina with baseline activity.  Continue aspirin, Coreg, Cozaar, Imdur, and Zocor.  3.  Essential hypertension.  No change in current regimen, keep follow-up with Dr. Ouida Sills.  4.  Lymphedema.  5.  Aortic  stenosis, mild by echocardiogram in October 2023.  Disposition:  Follow up  October  Signed, Jonelle Sidle, M.D., F.A.C.C. [facility] at [facility]

## 2023-03-20 NOTE — Patient Instructions (Signed)
Medication Instructions:  Your physician recommends that you continue on your current medications as directed. Please refer to the Current Medication list given to you today.   Labwork: None today  Testing/Procedures: Echo in October  Follow-Up:  October with Dr.McDowell  Any Other Special Instructions Will Be Listed Below (If Applicable).  If you need a refill on your cardiac medications before your next appointment, please call your pharmacy.

## 2023-03-24 DIAGNOSIS — J449 Chronic obstructive pulmonary disease, unspecified: Secondary | ICD-10-CM | POA: Diagnosis not present

## 2023-03-24 DIAGNOSIS — J9601 Acute respiratory failure with hypoxia: Secondary | ICD-10-CM | POA: Diagnosis not present

## 2023-03-24 DIAGNOSIS — Z96652 Presence of left artificial knee joint: Secondary | ICD-10-CM | POA: Diagnosis not present

## 2023-04-03 ENCOUNTER — Telehealth: Payer: Self-pay | Admitting: Cardiology

## 2023-04-03 NOTE — Telephone Encounter (Signed)
Pt c/o medication issue:  1. Name of Medication:   Preservision Ared 2 with COq10  2. How are you currently taking this medication (dosage and times per day)?  Not taking yet  3. Are you having a reaction (difficulty breathing--STAT)?   4. What is your medication issue?    Patient wants to know if she could take this medication.

## 2023-04-03 NOTE — Telephone Encounter (Signed)
Patient notified and verbalized understanding. Patient had no further questions or concerns at this time.  

## 2023-04-11 DIAGNOSIS — H353221 Exudative age-related macular degeneration, left eye, with active choroidal neovascularization: Secondary | ICD-10-CM | POA: Diagnosis not present

## 2023-04-12 DIAGNOSIS — E785 Hyperlipidemia, unspecified: Secondary | ICD-10-CM | POA: Diagnosis not present

## 2023-04-12 DIAGNOSIS — I5030 Unspecified diastolic (congestive) heart failure: Secondary | ICD-10-CM | POA: Diagnosis not present

## 2023-04-12 DIAGNOSIS — Z79899 Other long term (current) drug therapy: Secondary | ICD-10-CM | POA: Diagnosis not present

## 2023-04-16 ENCOUNTER — Inpatient Hospital Stay: Payer: Medicare Other | Attending: Hematology

## 2023-04-16 DIAGNOSIS — Z90721 Acquired absence of ovaries, unilateral: Secondary | ICD-10-CM | POA: Insufficient documentation

## 2023-04-16 DIAGNOSIS — D75839 Thrombocytosis, unspecified: Secondary | ICD-10-CM | POA: Diagnosis not present

## 2023-04-16 DIAGNOSIS — R252 Cramp and spasm: Secondary | ICD-10-CM | POA: Diagnosis not present

## 2023-04-16 DIAGNOSIS — Z8542 Personal history of malignant neoplasm of other parts of uterus: Secondary | ICD-10-CM | POA: Insufficient documentation

## 2023-04-16 DIAGNOSIS — Z79899 Other long term (current) drug therapy: Secondary | ICD-10-CM | POA: Diagnosis not present

## 2023-04-16 DIAGNOSIS — Z7982 Long term (current) use of aspirin: Secondary | ICD-10-CM | POA: Insufficient documentation

## 2023-04-16 DIAGNOSIS — H538 Other visual disturbances: Secondary | ICD-10-CM | POA: Insufficient documentation

## 2023-04-16 DIAGNOSIS — Z170 Estrogen receptor positive status [ER+]: Secondary | ICD-10-CM | POA: Insufficient documentation

## 2023-04-16 DIAGNOSIS — Z8673 Personal history of transient ischemic attack (TIA), and cerebral infarction without residual deficits: Secondary | ICD-10-CM | POA: Diagnosis not present

## 2023-04-16 DIAGNOSIS — Z79811 Long term (current) use of aromatase inhibitors: Secondary | ICD-10-CM | POA: Insufficient documentation

## 2023-04-16 DIAGNOSIS — Z8249 Family history of ischemic heart disease and other diseases of the circulatory system: Secondary | ICD-10-CM | POA: Insufficient documentation

## 2023-04-16 DIAGNOSIS — R5383 Other fatigue: Secondary | ICD-10-CM | POA: Diagnosis not present

## 2023-04-16 DIAGNOSIS — R232 Flushing: Secondary | ICD-10-CM | POA: Diagnosis not present

## 2023-04-16 DIAGNOSIS — M25512 Pain in left shoulder: Secondary | ICD-10-CM | POA: Insufficient documentation

## 2023-04-16 DIAGNOSIS — R0602 Shortness of breath: Secondary | ICD-10-CM | POA: Insufficient documentation

## 2023-04-16 DIAGNOSIS — D45 Polycythemia vera: Secondary | ICD-10-CM | POA: Insufficient documentation

## 2023-04-16 DIAGNOSIS — E669 Obesity, unspecified: Secondary | ICD-10-CM | POA: Diagnosis not present

## 2023-04-16 DIAGNOSIS — Z882 Allergy status to sulfonamides status: Secondary | ICD-10-CM | POA: Insufficient documentation

## 2023-04-16 DIAGNOSIS — C50412 Malignant neoplasm of upper-outer quadrant of left female breast: Secondary | ICD-10-CM | POA: Diagnosis not present

## 2023-04-16 DIAGNOSIS — G8929 Other chronic pain: Secondary | ICD-10-CM | POA: Insufficient documentation

## 2023-04-16 DIAGNOSIS — G629 Polyneuropathy, unspecified: Secondary | ICD-10-CM | POA: Insufficient documentation

## 2023-04-16 DIAGNOSIS — Z90710 Acquired absence of both cervix and uterus: Secondary | ICD-10-CM | POA: Diagnosis not present

## 2023-04-16 DIAGNOSIS — D473 Essential (hemorrhagic) thrombocythemia: Secondary | ICD-10-CM

## 2023-04-16 DIAGNOSIS — Z801 Family history of malignant neoplasm of trachea, bronchus and lung: Secondary | ICD-10-CM | POA: Diagnosis not present

## 2023-04-16 DIAGNOSIS — M7989 Other specified soft tissue disorders: Secondary | ICD-10-CM | POA: Insufficient documentation

## 2023-04-16 DIAGNOSIS — Z8261 Family history of arthritis: Secondary | ICD-10-CM | POA: Diagnosis not present

## 2023-04-16 LAB — COMPREHENSIVE METABOLIC PANEL
ALT: 11 U/L (ref 0–44)
AST: 15 U/L (ref 15–41)
Albumin: 3.6 g/dL (ref 3.5–5.0)
Alkaline Phosphatase: 83 U/L (ref 38–126)
Anion gap: 8 (ref 5–15)
BUN: 20 mg/dL (ref 8–23)
CO2: 28 mmol/L (ref 22–32)
Calcium: 9.2 mg/dL (ref 8.9–10.3)
Chloride: 101 mmol/L (ref 98–111)
Creatinine, Ser: 0.55 mg/dL (ref 0.44–1.00)
GFR, Estimated: >60 mL/min (ref >60)
Glucose, Bld: 94 mg/dL (ref 70–99)
Potassium: 3.9 mmol/L (ref 3.5–5.1)
Sodium: 137 mmol/L (ref 135–145)
Total Bilirubin: 0.9 mg/dL (ref 0.3–1.2)
Total Protein: 6.9 g/dL (ref 6.5–8.1)

## 2023-04-16 LAB — CBC WITH DIFFERENTIAL/PLATELET
Abs Immature Granulocytes: 0.01 10*3/uL (ref 0.00–0.07)
Basophils Absolute: 0.1 10*3/uL (ref 0.0–0.1)
Basophils Relative: 1 %
Eosinophils Absolute: 0.2 10*3/uL (ref 0.0–0.5)
Eosinophils Relative: 3 %
HCT: 45.5 % (ref 36.0–46.0)
Hemoglobin: 15.2 g/dL — ABNORMAL HIGH (ref 12.0–15.0)
Immature Granulocytes: 0 %
Lymphocytes Relative: 28 %
Lymphs Abs: 1.6 10*3/uL (ref 0.7–4.0)
MCH: 36.5 pg — ABNORMAL HIGH (ref 26.0–34.0)
MCHC: 33.4 g/dL (ref 30.0–36.0)
MCV: 109.4 fL — ABNORMAL HIGH (ref 80.0–100.0)
Monocytes Absolute: 0.5 10*3/uL (ref 0.1–1.0)
Monocytes Relative: 9 %
Neutro Abs: 3.4 10*3/uL (ref 1.7–7.7)
Neutrophils Relative %: 59 %
Platelets: 281 10*3/uL (ref 150–400)
RBC: 4.16 MIL/uL (ref 3.87–5.11)
RDW: 14.4 % (ref 11.5–15.5)
WBC: 5.8 10*3/uL (ref 4.0–10.5)
nRBC: 0 % (ref 0.0–0.2)

## 2023-04-16 LAB — LACTATE DEHYDROGENASE: LDH: 136 U/L (ref 98–192)

## 2023-04-17 ENCOUNTER — Ambulatory Visit: Payer: Medicare Other | Admitting: Cardiology

## 2023-04-18 DIAGNOSIS — H353211 Exudative age-related macular degeneration, right eye, with active choroidal neovascularization: Secondary | ICD-10-CM | POA: Diagnosis not present

## 2023-04-19 DIAGNOSIS — I10 Essential (primary) hypertension: Secondary | ICD-10-CM | POA: Diagnosis not present

## 2023-04-19 DIAGNOSIS — I5030 Unspecified diastolic (congestive) heart failure: Secondary | ICD-10-CM | POA: Diagnosis not present

## 2023-04-23 NOTE — Progress Notes (Unsigned)
[facility] [address], [place] [address]   CLINIC:  Medical Oncology/Hematology  PCP:  Carylon Perches, MD [address] / [place] [address] [phone]   REASON FOR VISIT:  - History of left-sided breast cancer (September 2018) - JAK2 + polycythemia vera/essential thrombocytosis   PRIOR THERAPY: Left-sided lumpectomy with adjuvant XRT   CURRENT THERAPY: - Letrozole daily since January 2019 - Hydrea [current dose 3 tablets (1500 mg) daily]  BRIEF ONCOLOGIC HISTORY:   Oncology History  Malignant neoplasm of upper-outer quadrant of left breast in female, estrogen receptor positive (HCC)  07/10/2017 Surgery   Left lumpectomy: IDC grade 2, 1.3 cm, intermediate grade DCIS, margins negative, 1/2 lymph nodes positive, ER 5% positive weak staining, PR 0%, HER-2 negative ratio 1.73, Ki-67 15%, T1cN0 stage IA AJCC 8   07/10/2017 Miscellaneous   Mammaprint: Low risk, 10-year risk of recurrence untreated 10%   10/01/2017 - 11/14/2017 Radiation Therapy   Adj XRT   11/2017 -  Anti-estrogen oral therapy   Letrozole daily     CANCER STAGING:  Cancer Staging  Malignant neoplasm of upper-outer quadrant of left breast in female, estrogen receptor positive (HCC) Staging form: Breast, AJCC 8th Edition - Clinical stage from 05/22/2017: Stage IA (cT1, cN0, cM0, G1, ER+, PR-, HER2-) - Signed by Hubbard Hartshorn, NP on 06/22/2017 - Pathologic: Stage IIA (pT1c, pN1a, cM0, G2, ER+, PR-, HER2-) - Unsigned   INTERVAL HISTORY:   Ms. ALEXANDREA CLENNON, a 82 y.o. female, returns for routine follow-up of her JAK2 PV/ET and history of left-sided breast cancer. Marsie was last seen by Rojelio Brenner PA-C on 02/12/2023.  At her last visit, Hydrea was increased to 1000 mg daily, then increased again to 1500 mg daily as of 03/15/2023. *** She is tolerating her Hydrea well without any skin sores, mouth sores, or GI side effects. *** Energy has been at baseline. *** She  denies any vasomotor symptoms or thrombotic events. *** No B symptoms. *** She has leg swelling that is equal bilaterally.  She continues to report left breast tenderness and "pulling pain" that happens intermittently.  *** *** She denies any breast lumps, new-onset bone pain, B symptoms, or neurologic changes. *** She is tolerating letrozole fairly well with in occasional hot flashes and leg cramps.  She reports ***% energy and ***% appetite.  She is maintaining stable weight at this time.   ASSESSMENT & PLAN:  1.  JAK2+ essential thrombocytosis versus polycythemia vera - JAK2 V617F mutation detected in 2015 - No prior history of thrombosis - Hydroxyurea was started in November 2019 - Current dose of Hydrea is 1500 mg daily *** - No vasomotor symptoms, erythromelalgia, or aquagenic pruritus    *** - She is taking aspirin 81 mg daily.*** - Most recent labs (04/16/2023): Hgb 15.2/hematocrit 45.5%, WBC 5.8 with normal differential, platelets 281.  CMP and LDH normal. - Platelets are the primarily affected cell line, but she does have intermittent elevations in Hgb/HCT, which could indicate polycythemia vera rather than essential thrombocytosis.  No change in management or monitoring strategy.  Goal remains platelets <400 and HCT <45.0%. - PLAN: Continue Hydrea 1500 mg daily.  *** - Labs and follow-up 2 months *** - then every 3 months thereafter *** - Continue aspirin 81 mg daily - Patient is aware of alarm symptoms that would prompt more immediate medical attention.   2.  T1CN1A (stage IIa) malignant neoplasm of upper-outer quadrant of left breast - Left  breast biopsy on 05/29/2017 of the upper outer quadrant mass - IDC, grade 1/2.  ER 5%, PR 0%, Ki-67 15%, HER-2 negative. - Left breast lumpectomy on 07/10/2017, IDC, grade 2, 1.3 cm, margins negative, 1/2 lymph nodes positive, pT1c, PN 1A - MammaPrint-low risk luminal A, average 10-year risk of recurrence untreated 10%. - Adjuvant XRT from  10/01/2017-11/15/2017. - Letrozole 1 mg daily started on 11/15/2017. - BCI testing (October 2023) showed that patient would likely benefit from extended endocrine therapy.  (8.5% risk of late distant recurrence after 5 years of adjuvant endocrine therapy, compared to 2.8% - 3.6% risk of late distant recurrence after 10 total years of adjuvant endocrine therapy) - She is tolerating LETROZOLE with occasional hot flashes and leg cramps - Reviewed mammogram from 05/25/2022, BI-RADS Category 1, negative.  No mammographic evidence of malignancy in either breast. -  Physical exam by Dr. Ellin Saba in January 2024, no suspicious mass, but some mild soreness below left upper outer quadrant scar.   - ROS did not reveal any red flag symptoms of recurrence    - Most recent labs show baseline CBC and normal CMP - PLAN: Continue letrozole.  Goal of treatment is 10 years (through January 2029) based on BCI results. -- Next mammogram due July 2024.   - Breast exam annually and as needed, will check at next visit (deferred today)   3.  Bone health - Bone density scan (05/24/2021): T score -1.0, normal - Most recent vitamin D (08/07/2022) normal at 80.66 - Taking vitamin D 50,000 units every other week - PLAN: In the setting of aromatase inhibitor use, we will continue to check bone density scan every 2 to 3 years, next due July 2024.  Will check vitamin D with next labs   4.  Social/family history: - She was recently widowed in February 2022.  She is living independently.  She works part-time for 6 hours a day, office job.  She is non-smoker. - Father had lung cancer.  PLAN SUMMARY: >> Mammogram July 2024 >> Bone density DEXA July 2024 >> Labs in 2 months = CBC/D, CMP, LDH, vitamin D >> OFFICE visit in 2 months (after labs) ***   REVIEW OF SYSTEMS:***  Review of Systems  Constitutional:  Positive for fatigue. Negative for appetite change, chills, diaphoresis, fever and unexpected weight change.  HENT:    Negative for lump/mass and nosebleeds.   Eyes:  Negative for eye problems.  Respiratory:  Negative for cough, hemoptysis and shortness of breath.   Cardiovascular:  Negative for chest pain, leg swelling and palpitations.  Gastrointestinal:  Negative for abdominal pain, blood in stool, constipation, diarrhea, nausea and vomiting.  Genitourinary:  Negative for hematuria.   Skin: Negative.   Neurological:  Negative for dizziness, headaches and light-headedness.  Hematological:  Does not bruise/bleed easily.  Psychiatric/Behavioral:  Positive for depression. The patient is nervous/anxious.     PHYSICAL EXAM:***  Performance status (ECOG): 3 - Symptomatic, >50% confined to bed  There were no vitals filed for this visit. Wt Readings from Last 3 Encounters:  03/20/23 234 lb 6.4 oz (106.3 kg)  02/12/23 229 lb 3.2 oz (104 kg)  12/21/22 230 lb (104.3 kg)   Physical Exam Constitutional:      Appearance: Normal appearance. She is obese.  Cardiovascular:     Heart sounds: Murmur heard.  Pulmonary:     Breath sounds: Normal breath sounds.  Musculoskeletal:     Right lower leg: Edema present.     Left  lower leg: Edema present.  Neurological:     General: No focal deficit present.     Mental Status: Mental status is at baseline.  Psychiatric:        Behavior: Behavior normal. Behavior is cooperative.      PAST MEDICAL/SURGICAL HISTORY:  Past Medical History:  Diagnosis Date   Arthritis    Chronic diastolic heart failure (HCC)    Essential hypertension    GERD (gastroesophageal reflux disease)    Gout    Hiatal hernia    History of endometrial cancer    Sp hysterectomy 2012   Ischemic colitis (HCC) 2016   Myeloproliferative disorder, JAK-2 positive    Obesity    Pneumonia    Polycythemia vera(238.4)    TIA (transient ischemic attack)    Past Surgical History:  Procedure Laterality Date   ABDOMINAL HYSTERECTOMY  09/12/2011   Procedure: HYSTERECTOMY ABDOMINAL;  Surgeon: Laurette Schimke, MD;  Location: [facility];  Service: Gynecology;  Laterality: N/A;  Total Abdominal Hysterectomy, Bilateral Salpingo Oophorectomy   BREAST LUMPECTOMY Left 07/10/2017   LEFT BREAST LUMPECTOMY WITH RADIOACTIVE SEED AND SENTINEL LYMPH NODE BIOPSY    BREAST LUMPECTOMY WITH RADIOACTIVE SEED AND SENTINEL LYMPH NODE BIOPSY Left 07/10/2017   Procedure: LEFT BREAST LUMPECTOMY WITH RADIOACTIVE SEED AND SENTINEL LYMPH NODE BIOPSY;  Surgeon: Emelia Loron, MD;  Location[facility] OR;  Service: General;  Laterality: Left;   COLONOSCOPY  07/05/2012   Procedure: COLONOSCOPY;  Surgeon: Malissa Hippo, MD;  Location: [facility] SUITE;  Service: Endoscopy;  Laterality: N/A;  730   EYE SURGERY     bilateral 8 - 12 yrs ago   FLEXIBLE SIGMOIDOSCOPY N/A 04/03/2015   Procedure: FLEXIBLE SIGMOIDOSCOPY;  Surgeon: Malissa Hippo, MD;  Location: [facility];  Service: Endoscopy;  Laterality: N/A;   HYSTEROSCOPY WITH D & C  08/15/2011   Procedure: DILATATION AND CURETTAGE (D&C) /HYSTEROSCOPY;  Surgeon: Tilda Burrow, MD;  Location: [facility];  Service: Gynecology;  Laterality: N/A;  With Suction Curette   JOINT REPLACEMENT     left knee replaced 2014   KNEE ARTHROPLASTY Left 03/15/2015   Procedure: COMPUTER ASSISTED TOTAL KNEE ARTHROPLASTY;  Surgeon: Eldred Manges, MD;  Location: [facility] OR;  Service: Orthopedics;  Laterality: Left;   SALPINGOOPHORECTOMY  09/12/2011   Procedure: SALPINGO OOPHERECTOMY;  Surgeon: Laurette Schimke, MD;  Location: [facility];  Service: Gynecology;  Laterality: Bilateral;    SOCIAL HISTORY:  Social History   Socioeconomic History   Marital status: Widowed    Spouse name: Not on file   Number of children: 2   Years of education: 12   Highest education level: High school graduate  Occupational History   Not on file  Tobacco Use   Smoking status: Never    Passive exposure: Never   Smokeless tobacco: Never  Vaping Use   Vaping Use: Never used  Substance and Sexual Activity   Alcohol use: No    Drug use: No   Sexual activity: Not Currently    Partners: Male    Birth control/protection: Surgical    Comment: hysterectomy  Other Topics Concern   Not on file  Social History Narrative   Not on file   Social Determinants of Health   Financial Resource Strain: Low Risk  (09/11/2022)   Overall Financial Resource Strain (CARDIA)    Difficulty of Paying Living Expenses: Not hard at all  Food Insecurity: No Food Insecurity (09/11/2022)   Hunger Vital Sign  Worried About Programme researcher, broadcasting/film/video in the Last Year: Never true    Ran Out of Food in the Last Year: Never true  Transportation Needs: No Transportation Needs (09/11/2022)   PRAPARE - Administrator, Civil Service (Medical): No    Lack of Transportation (Non-Medical): No  Physical Activity: Sufficiently Active (09/11/2022)   Exercise Vital Sign    Days of Exercise per Week: 5 days    Minutes of Exercise per Session: 30 min  Stress: No Stress Concern Present (09/11/2022)   [employer] of Occupational Health - Occupational Stress Questionnaire    Feeling of Stress : Only a little  Social Connections: Moderately Integrated (09/11/2022)   Social Connection and Isolation Panel [NHANES]    Frequency of Communication with Friends and Family: More than three times a week    Frequency of Social Gatherings with Friends and Family: More than three times a week    Attends Religious Services: More than 4 times per year    Active Member of [employer] or Organizations: Yes    Attends Banker Meetings: More than 4 times per year    Marital Status: Widowed  Intimate Partner Violence: Not At Risk (09/11/2022)   Humiliation, Afraid, Rape, and Kick questionnaire    Fear of Current or Ex-Partner: No    Emotionally Abused: No    Physically Abused: No    Sexually Abused: No    FAMILY HISTORY:  Family History  Problem Relation Age of Onset   Hypertension Mother    Lung cancer Father    Arthritis/Rheumatoid Sister     Anesthesia problems Neg Hx    Hypotension Neg Hx    Malignant hyperthermia Neg Hx    Pseudochol deficiency Neg Hx     CURRENT MEDICATIONS:  Current Outpatient Medications  Medication Sig Dispense Refill   aspirin EC 81 MG tablet Take 81 mg by mouth at bedtime.      carvedilol (COREG) 12.5 MG tablet Take 12.5 mg by mouth 2 (two) times daily with a meal. Take 1/2 tablet by 2 times daily     cloNIDine (CATAPRES) 0.3 MG tablet Take 0.15 mg by mouth 2 (two) times daily.     diltiazem (CARDIZEM CD) 180 MG 24 hr capsule Take 180 mg by mouth daily.      doxylamine, Sleep, (SLEEP AID) 25 MG tablet Take 25 mg by mouth at bedtime as needed.     empagliflozin (JARDIANCE) 10 MG TABS tablet Take 1 tablet (10 mg total) by mouth daily before breakfast. 30 tablet 6   esomeprazole (NEXIUM) 40 MG capsule Take 1 capsule (40 mg total) by mouth every morning. 30 capsule 11   furosemide (LASIX) 40 MG tablet Take 1.5 tablets (60 mg total) by mouth daily. 135 tablet 3   hydroxyurea (HYDREA) 500 MG capsule Take 3 capsules (1,500 mg total) by mouth daily. 90 capsule 2   isosorbide mononitrate (IMDUR) 30 MG 24 hr tablet TAKE 1/2 (ONE-HALF) TABLET BY MOUTH AT BEDTIME 45 tablet 1   letrozole (FEMARA) 2.5 MG tablet Take 1 tablet by mouth once daily 90 tablet 0   losartan (COZAAR) 100 MG tablet Take 100 mg by mouth every evening.      Multiple Vitamins-Minerals (PRESERVISION AREDS PO) Take by mouth.     Simethicone (GAS RELIEF PO) Take 1 tablet by mouth daily.     simvastatin (ZOCOR) 10 MG tablet Take 10 mg by mouth every evening.      Vitamin D,  Ergocalciferol, (DRISDOL) 1.25 MG (50000 UNIT) CAPS capsule Take 1 capsule (50,000 Units total) by mouth every 14 (fourteen) days. 6 capsule 3   No current facility-administered medications for this visit.    ALLERGIES:  Allergies  Allergen Reactions   Carbamazepine Hives and Other (See Comments)    headache   Sulfa Antibiotics     Rash    LABORATORY DATA:  I have  reviewed the labs as listed.     Latest Ref Rng & Units 04/16/2023    1:40 PM 03/14/2023    1:13 PM 02/05/2023    1:08 PM  CBC  WBC 4.0 - 10.5 K/uL 5.8  6.6  6.8   Hemoglobin 12.0 - 15.0 g/dL 16.1  09.6  04.5   Hematocrit 36.0 - 46.0 % 45.5  50.2  48.2   Platelets 150 - 400 K/uL 281  300  364       Latest Ref Rng & Units 04/16/2023    1:40 PM 02/05/2023    1:08 PM 08/07/2022   12:50 PM  CMP  Glucose 70 - 99 mg/dL 94  94  78   BUN 8 - 23 mg/dL 20  23  20    Creatinine 0.44 - 1.00 mg/dL 4.09  8.11  9.14   Sodium 135 - 145 mmol/L 137  138  138   Potassium 3.5 - 5.1 mmol/L 3.9  4.1  4.0   Chloride 98 - 111 mmol/L 101  101  103   CO2 22 - 32 mmol/L 28  28  30    Calcium 8.9 - 10.3 mg/dL 9.2  9.1  9.4   Total Protein 6.5 - 8.1 g/dL 6.9  7.4  7.1   Total Bilirubin 0.3 - 1.2 mg/dL 0.9  1.1  0.8   Alkaline Phos 38 - 126 U/L 83  88  82   AST 15 - 41 U/L 15  16  17    ALT 0 - 44 U/L 11  10  12      DIAGNOSTIC IMAGING:  I have independently reviewed the scans and discussed with the patient. No results found.   WRAP UP:  All questions were answered. The patient knows to call the clinic with any problems, questions or concerns.  Medical decision making: Moderate  Time spent on visit: I spent 20 minutes counseling the patient face to face. The total time spent in the appointment was 30 minutes and more than 50% was on counseling.  Carnella Guadalajara, PA-C  ***

## 2023-04-24 ENCOUNTER — Inpatient Hospital Stay: Payer: Medicare Other | Admitting: Physician Assistant

## 2023-04-24 VITALS — BP 115/84 | HR 61 | Temp 98.8°F | Resp 19 | Ht 64.0 in | Wt 230.2 lb

## 2023-04-24 DIAGNOSIS — R5383 Other fatigue: Secondary | ICD-10-CM | POA: Diagnosis not present

## 2023-04-24 DIAGNOSIS — M7989 Other specified soft tissue disorders: Secondary | ICD-10-CM | POA: Diagnosis not present

## 2023-04-24 DIAGNOSIS — Z7982 Long term (current) use of aspirin: Secondary | ICD-10-CM | POA: Diagnosis not present

## 2023-04-24 DIAGNOSIS — D75839 Thrombocytosis, unspecified: Secondary | ICD-10-CM | POA: Diagnosis not present

## 2023-04-24 DIAGNOSIS — C50412 Malignant neoplasm of upper-outer quadrant of left female breast: Secondary | ICD-10-CM

## 2023-04-24 DIAGNOSIS — D473 Essential (hemorrhagic) thrombocythemia: Secondary | ICD-10-CM

## 2023-04-24 DIAGNOSIS — H538 Other visual disturbances: Secondary | ICD-10-CM | POA: Diagnosis not present

## 2023-04-24 DIAGNOSIS — D45 Polycythemia vera: Secondary | ICD-10-CM | POA: Diagnosis not present

## 2023-04-24 DIAGNOSIS — J449 Chronic obstructive pulmonary disease, unspecified: Secondary | ICD-10-CM | POA: Diagnosis not present

## 2023-04-24 DIAGNOSIS — Z170 Estrogen receptor positive status [ER+]: Secondary | ICD-10-CM | POA: Diagnosis not present

## 2023-04-24 DIAGNOSIS — Z79811 Long term (current) use of aromatase inhibitors: Secondary | ICD-10-CM | POA: Diagnosis not present

## 2023-04-24 DIAGNOSIS — Z96652 Presence of left artificial knee joint: Secondary | ICD-10-CM | POA: Diagnosis not present

## 2023-04-24 DIAGNOSIS — E559 Vitamin D deficiency, unspecified: Secondary | ICD-10-CM

## 2023-04-24 DIAGNOSIS — Z8673 Personal history of transient ischemic attack (TIA), and cerebral infarction without residual deficits: Secondary | ICD-10-CM | POA: Diagnosis not present

## 2023-04-24 DIAGNOSIS — R232 Flushing: Secondary | ICD-10-CM | POA: Diagnosis not present

## 2023-04-24 DIAGNOSIS — G8929 Other chronic pain: Secondary | ICD-10-CM | POA: Diagnosis not present

## 2023-04-24 DIAGNOSIS — R252 Cramp and spasm: Secondary | ICD-10-CM | POA: Diagnosis not present

## 2023-04-24 DIAGNOSIS — J9601 Acute respiratory failure with hypoxia: Secondary | ICD-10-CM | POA: Diagnosis not present

## 2023-04-24 DIAGNOSIS — Z882 Allergy status to sulfonamides status: Secondary | ICD-10-CM | POA: Diagnosis not present

## 2023-04-24 DIAGNOSIS — M25512 Pain in left shoulder: Secondary | ICD-10-CM | POA: Diagnosis not present

## 2023-04-24 DIAGNOSIS — Z79899 Other long term (current) drug therapy: Secondary | ICD-10-CM | POA: Diagnosis not present

## 2023-04-24 DIAGNOSIS — Z801 Family history of malignant neoplasm of trachea, bronchus and lung: Secondary | ICD-10-CM | POA: Diagnosis not present

## 2023-04-24 DIAGNOSIS — R0602 Shortness of breath: Secondary | ICD-10-CM | POA: Diagnosis not present

## 2023-04-24 DIAGNOSIS — Z8261 Family history of arthritis: Secondary | ICD-10-CM | POA: Diagnosis not present

## 2023-04-24 DIAGNOSIS — G629 Polyneuropathy, unspecified: Secondary | ICD-10-CM | POA: Diagnosis not present

## 2023-04-24 MED ORDER — HYDROXYUREA 500 MG PO CAPS
1000.0000 mg | ORAL_CAPSULE | Freq: Every day | ORAL | 2 refills | Status: DC
Start: 2023-04-24 — End: 2023-06-13

## 2023-04-24 NOTE — Patient Instructions (Signed)
[facility] at [facility] Discharge Instructions  You were seen today by Rojelio Brenner PA-C for the following conditions.  ESSENTIAL THROMBOCYTOSIS / POLYCYTHEMIA VERA: Your blood count remains slightly higher than your target range.   Continue taking Hydrea 1000 mg (2 tablets) once daily. We will check labs and see you for office visit again in 2 months, and see if you need additional dose adjustment. Continue aspirin 81 mg daily.  HISTORY OF LEFT BREAST CANCER: - Continue to take letrozole (Femara) as prescribed for your breast cancer. - We will schedule you for bone density scan and mammogram in July 2024. - We will perform breast exam at follow-up visit in 2 months.  MEDICATIONS: - Continue hydroxyurea (Hydrea) as above - Continue vitamin D once per week.  FOLLOW-UP APPOINTMENT: Office visit in 2 months, after labs   Thank you for choosing [facility] at [facility] to provide your oncology and hematology care.  To afford each patient quality time with our provider, please arrive at least 15 minutes before your scheduled appointment time.   If you have a lab appointment with the Cancer Center please come in thru the Main Entrance and check in at the main information desk.  You need to re-schedule your appointment should you arrive 10 or more minutes late.  We strive to give you quality time with our providers, and arriving late affects you and other patients whose appointments are after yours.  Also, if you no show three or more times for appointments you may be dismissed from the clinic at the providers discretion.     Again, thank you for choosing [facility]  Our hope is that these requests will decrease the amount of time that you wait before being seen by our physicians.       _____________________________________________________________  Should you have questions after your visit to [facility],  please contact our office at [phone] and follow the prompts.  Our office hours are 8:00 a.m. and 4:30 p.m. Monday - Friday.  Please note that voicemails left after 4:00 p.m. may not be returned until the following business day.  We are closed weekends and major holidays.  You do have access to a nurse 24-7, just call the main number to the clinic [phone] and do not press any options, hold on the line and a nurse will answer the phone.    For prescription refill requests, have your pharmacy contact our office and allow 72 hours.    Due to Covid, you will need to wear a mask upon entering the hospital. If you do not have a mask, a mask will be given to you at the Main Entrance upon arrival. For doctor visits, patients may have 1 support person age 45 or older with them. For treatment visits, patients can not have anyone with them due to social distancing guidelines and our immunocompromised population.

## 2023-04-30 ENCOUNTER — Encounter: Payer: Self-pay | Admitting: Orthopedic Surgery

## 2023-04-30 ENCOUNTER — Ambulatory Visit: Payer: Medicare Other | Admitting: Orthopedic Surgery

## 2023-04-30 VITALS — BP 154/75 | HR 80 | Ht 64.0 in | Wt 230.0 lb

## 2023-04-30 DIAGNOSIS — M25512 Pain in left shoulder: Secondary | ICD-10-CM | POA: Diagnosis not present

## 2023-04-30 DIAGNOSIS — M25312 Other instability, left shoulder: Secondary | ICD-10-CM

## 2023-04-30 DIAGNOSIS — G8929 Other chronic pain: Secondary | ICD-10-CM

## 2023-04-30 MED ORDER — CELECOXIB 100 MG PO CAPS
100.0000 mg | ORAL_CAPSULE | Freq: Two times a day (BID) | ORAL | 0 refills | Status: DC
Start: 2023-04-30 — End: 2023-06-22

## 2023-04-30 NOTE — Progress Notes (Signed)
Chief Complaint  Patient presents with   Shoulder Pain    Left    Chronic left shoulder pain   Previously received injection left shoulder in 2024  No OA in the [facility] joint on xrays   82 year old female with rotator cuff insufficiency left shoulder presents back with pain lower in the normal range deltoid area the proximal part of well in the shoulder  History of difficulty lifting her arm.  Examination shows the tenderness is more deltoid and some in the posterior part of the joint  Weakness in the left shoulder as well  Passive range of motion with the arm at her side is  Previous imaging showed no evidence of glenohumeral arthritis  She would like to switch from meloxicam to Celebrex if possible as it did very well for her in the past  Encounter Diagnoses  Name Primary?   Chronic left shoulder pain Yes   Rotator cuff insufficiency of left shoulder     Procedure note the subacromial injection shoulder left   Verbal consent was obtained to inject the  Left   Shoulder  Timeout was completed to confirm the injection site is a subacromial space of the  left  shoulder  Medication used Depo-Medrol 40 mg and lidocaine 1% 3 cc  Anesthesia was provided by ethyl chloride  The injection was performed in the left  posterior subacromial space. After pinning the skin with alcohol and anesthetized the skin with ethyl chloride the subacromial space was injected using a 20-gauge needle. There were no complications  Sterile dressing was applied.  Meds ordered this encounter  Medications   celecoxib (CELEBREX) 100 MG capsule    Sig: Take 1 capsule (100 mg total) by mouth 2 (two) times daily.    Dispense:  60 capsule    Refill:  0    She has taken Celebrex in the past with no evidence of rash or allergy

## 2023-05-14 DIAGNOSIS — H353221 Exudative age-related macular degeneration, left eye, with active choroidal neovascularization: Secondary | ICD-10-CM | POA: Diagnosis not present

## 2023-05-15 ENCOUNTER — Telehealth: Payer: Self-pay | Admitting: Orthopedic Surgery

## 2023-05-15 NOTE — Telephone Encounter (Signed)
Dr. Mort Sawyers pt - spoke w/the pt, she said she has had issues since her last visit on 04/30/23.  Stated she is having pain between her elbow and shoulder and bruises.  She wants to know if the Celebrex is causing the bruising.

## 2023-05-17 NOTE — Telephone Encounter (Signed)
I called spoke with patient, the past two days she has continued Celebrex.  The bruising she had seen has went down already, thinks she bumped it and did not realize.  Her arm feels better too while on the Celebrex.  She will continue the medication and will call office if needed for further/has questions.

## 2023-05-18 DIAGNOSIS — H353211 Exudative age-related macular degeneration, right eye, with active choroidal neovascularization: Secondary | ICD-10-CM | POA: Diagnosis not present

## 2023-05-23 ENCOUNTER — Ambulatory Visit (HOSPITAL_COMMUNITY): Payer: Medicare Other

## 2023-05-23 ENCOUNTER — Other Ambulatory Visit (HOSPITAL_COMMUNITY): Payer: Medicare Other

## 2023-05-23 ENCOUNTER — Inpatient Hospital Stay: Payer: Medicare Other | Attending: Hematology

## 2023-05-23 DIAGNOSIS — D45 Polycythemia vera: Secondary | ICD-10-CM | POA: Diagnosis not present

## 2023-05-23 DIAGNOSIS — E559 Vitamin D deficiency, unspecified: Secondary | ICD-10-CM | POA: Diagnosis not present

## 2023-05-23 DIAGNOSIS — C50412 Malignant neoplasm of upper-outer quadrant of left female breast: Secondary | ICD-10-CM | POA: Diagnosis not present

## 2023-05-23 DIAGNOSIS — D473 Essential (hemorrhagic) thrombocythemia: Secondary | ICD-10-CM | POA: Insufficient documentation

## 2023-05-23 DIAGNOSIS — Z170 Estrogen receptor positive status [ER+]: Secondary | ICD-10-CM | POA: Insufficient documentation

## 2023-05-23 DIAGNOSIS — Z79899 Other long term (current) drug therapy: Secondary | ICD-10-CM | POA: Diagnosis not present

## 2023-05-23 LAB — CBC WITH DIFFERENTIAL/PLATELET
Abs Immature Granulocytes: 0.01 10*3/uL (ref 0.00–0.07)
Basophils Absolute: 0.1 10*3/uL (ref 0.0–0.1)
Basophils Relative: 1 %
Eosinophils Absolute: 0.2 10*3/uL (ref 0.0–0.5)
Eosinophils Relative: 3 %
HCT: 46.3 % — ABNORMAL HIGH (ref 36.0–46.0)
Hemoglobin: 15.5 g/dL — ABNORMAL HIGH (ref 12.0–15.0)
Immature Granulocytes: 0 %
Lymphocytes Relative: 21 %
Lymphs Abs: 1.2 10*3/uL (ref 0.7–4.0)
MCH: 37.7 pg — ABNORMAL HIGH (ref 26.0–34.0)
MCHC: 33.5 g/dL (ref 30.0–36.0)
MCV: 112.7 fL — ABNORMAL HIGH (ref 80.0–100.0)
Monocytes Absolute: 0.4 10*3/uL (ref 0.1–1.0)
Monocytes Relative: 7 %
Neutro Abs: 3.7 10*3/uL (ref 1.7–7.7)
Neutrophils Relative %: 68 %
Platelets: 260 10*3/uL (ref 150–400)
RBC: 4.11 MIL/uL (ref 3.87–5.11)
RDW: 14.8 % (ref 11.5–15.5)
WBC: 5.4 10*3/uL (ref 4.0–10.5)
nRBC: 0 % (ref 0.0–0.2)

## 2023-05-23 LAB — COMPREHENSIVE METABOLIC PANEL
ALT: 12 U/L (ref 0–44)
AST: 17 U/L (ref 15–41)
Albumin: 3.7 g/dL (ref 3.5–5.0)
Alkaline Phosphatase: 83 U/L (ref 38–126)
Anion gap: 5 (ref 5–15)
BUN: 17 mg/dL (ref 8–23)
CO2: 31 mmol/L (ref 22–32)
Calcium: 9 mg/dL (ref 8.9–10.3)
Chloride: 105 mmol/L (ref 98–111)
Creatinine, Ser: 0.55 mg/dL (ref 0.44–1.00)
GFR, Estimated: >60 mL/min (ref >60)
Glucose, Bld: 93 mg/dL (ref 70–99)
Potassium: 3.8 mmol/L (ref 3.5–5.1)
Sodium: 141 mmol/L (ref 135–145)
Total Bilirubin: 1 mg/dL (ref 0.3–1.2)
Total Protein: 6.9 g/dL (ref 6.5–8.1)

## 2023-05-23 LAB — LACTATE DEHYDROGENASE: LDH: 153 U/L (ref 98–192)

## 2023-05-23 LAB — VITAMIN D 25 HYDROXY (VIT D DEFICIENCY, FRACTURES): Vit D, 25-Hydroxy: 62.46 ng/mL (ref 30–100)

## 2023-05-24 DIAGNOSIS — Z96652 Presence of left artificial knee joint: Secondary | ICD-10-CM | POA: Diagnosis not present

## 2023-05-24 DIAGNOSIS — J9601 Acute respiratory failure with hypoxia: Secondary | ICD-10-CM | POA: Diagnosis not present

## 2023-05-24 DIAGNOSIS — J449 Chronic obstructive pulmonary disease, unspecified: Secondary | ICD-10-CM | POA: Diagnosis not present

## 2023-05-30 ENCOUNTER — Encounter (HOSPITAL_COMMUNITY): Payer: Self-pay

## 2023-05-30 ENCOUNTER — Ambulatory Visit (HOSPITAL_COMMUNITY)
Admission: RE | Admit: 2023-05-30 | Discharge: 2023-05-30 | Disposition: A | Discharge status: Home / Self Care | Payer: Medicare Other | Source: Ambulatory Visit | Attending: Physician Assistant | Admitting: Physician Assistant

## 2023-05-30 DIAGNOSIS — Z853 Personal history of malignant neoplasm of breast: Secondary | ICD-10-CM | POA: Diagnosis not present

## 2023-05-30 DIAGNOSIS — C50412 Malignant neoplasm of upper-outer quadrant of left female breast: Secondary | ICD-10-CM

## 2023-05-30 DIAGNOSIS — Z1231 Encounter for screening mammogram for malignant neoplasm of breast: Secondary | ICD-10-CM | POA: Insufficient documentation

## 2023-05-30 DIAGNOSIS — D45 Polycythemia vera: Secondary | ICD-10-CM | POA: Insufficient documentation

## 2023-05-30 DIAGNOSIS — E559 Vitamin D deficiency, unspecified: Secondary | ICD-10-CM | POA: Insufficient documentation

## 2023-05-30 DIAGNOSIS — D473 Essential (hemorrhagic) thrombocythemia: Secondary | ICD-10-CM | POA: Insufficient documentation

## 2023-05-30 DIAGNOSIS — Z170 Estrogen receptor positive status [ER+]: Secondary | ICD-10-CM | POA: Insufficient documentation

## 2023-05-30 DIAGNOSIS — Z780 Asymptomatic menopausal state: Secondary | ICD-10-CM | POA: Insufficient documentation

## 2023-05-30 DIAGNOSIS — M85852 Other specified disorders of bone density and structure, left thigh: Secondary | ICD-10-CM | POA: Diagnosis not present

## 2023-05-30 DIAGNOSIS — M8589 Other specified disorders of bone density and structure, multiple sites: Secondary | ICD-10-CM | POA: Insufficient documentation

## 2023-06-13 ENCOUNTER — Inpatient Hospital Stay: Payer: Medicare Other | Attending: Hematology | Admitting: Physician Assistant

## 2023-06-13 VITALS — BP 139/71 | HR 63 | Temp 99.0°F | Resp 16 | Wt 232.4 lb

## 2023-06-13 DIAGNOSIS — Z90721 Acquired absence of ovaries, unilateral: Secondary | ICD-10-CM | POA: Diagnosis not present

## 2023-06-13 DIAGNOSIS — Z79899 Other long term (current) drug therapy: Secondary | ICD-10-CM | POA: Diagnosis not present

## 2023-06-13 DIAGNOSIS — M8580 Other specified disorders of bone density and structure, unspecified site: Secondary | ICD-10-CM | POA: Diagnosis not present

## 2023-06-13 DIAGNOSIS — D45 Polycythemia vera: Secondary | ICD-10-CM | POA: Insufficient documentation

## 2023-06-13 DIAGNOSIS — Z79811 Long term (current) use of aromatase inhibitors: Secondary | ICD-10-CM | POA: Diagnosis not present

## 2023-06-13 DIAGNOSIS — Z8673 Personal history of transient ischemic attack (TIA), and cerebral infarction without residual deficits: Secondary | ICD-10-CM | POA: Insufficient documentation

## 2023-06-13 DIAGNOSIS — D75839 Thrombocytosis, unspecified: Secondary | ICD-10-CM | POA: Diagnosis not present

## 2023-06-13 DIAGNOSIS — C50412 Malignant neoplasm of upper-outer quadrant of left female breast: Secondary | ICD-10-CM | POA: Diagnosis not present

## 2023-06-13 DIAGNOSIS — Z8249 Family history of ischemic heart disease and other diseases of the circulatory system: Secondary | ICD-10-CM | POA: Insufficient documentation

## 2023-06-13 DIAGNOSIS — G629 Polyneuropathy, unspecified: Secondary | ICD-10-CM | POA: Insufficient documentation

## 2023-06-13 DIAGNOSIS — Z801 Family history of malignant neoplasm of trachea, bronchus and lung: Secondary | ICD-10-CM | POA: Diagnosis not present

## 2023-06-13 DIAGNOSIS — D473 Essential (hemorrhagic) thrombocythemia: Secondary | ICD-10-CM | POA: Diagnosis not present

## 2023-06-13 DIAGNOSIS — Z8261 Family history of arthritis: Secondary | ICD-10-CM | POA: Insufficient documentation

## 2023-06-13 DIAGNOSIS — E559 Vitamin D deficiency, unspecified: Secondary | ICD-10-CM

## 2023-06-13 DIAGNOSIS — Z90710 Acquired absence of both cervix and uterus: Secondary | ICD-10-CM | POA: Diagnosis not present

## 2023-06-13 DIAGNOSIS — Z170 Estrogen receptor positive status [ER+]: Secondary | ICD-10-CM | POA: Diagnosis not present

## 2023-06-13 DIAGNOSIS — Z882 Allergy status to sulfonamides status: Secondary | ICD-10-CM | POA: Insufficient documentation

## 2023-06-13 DIAGNOSIS — R9230 Dense breasts, unspecified: Secondary | ICD-10-CM | POA: Insufficient documentation

## 2023-06-13 MED ORDER — HYDROXYUREA 500 MG PO CAPS
ORAL_CAPSULE | ORAL | 3 refills | Status: DC
Start: 2023-06-13 — End: 2023-09-19

## 2023-06-13 NOTE — Progress Notes (Signed)
Patient is taking Hydrea and letrozole as prescribed.  She has not missed any doses and reports no side effects at this time.  ? ?

## 2023-06-13 NOTE — Progress Notes (Unsigned)
[name], female    DOB: 1941-02-18    MRN: [id number]   Brief patient profile:  89  yowf  never smoker  referred to pulmonary clinic in [place]  05/23/2022 by Dr Diona Browner  for unexplained hypoxemia dating back to time of  abd hysterectomy in 2012 at wt 271 and supplied with 02 but rarely used it but noted difficulty from [facility] parking into her work around summer 2022      History of Present Illness  05/23/2022  Pulmonary/ 1st office eval/  / Sales executive Complaint  Patient presents with   Consult    Patient was seeing Dr. [name] has been on O2 for years- prn during day and sometimes at night. She states her breathing has worsened over last year.   Dyspnea:  50 ft and 02 drops Cough: just hoarse  Sleep: 30 degree on side - immediate smothering if lies flat  SABA use: not sure they help  Vax x 2 for covid / never infected  Rec Try using 2lpm at bedtime for now and we will do an overnight study to be sure that's enough  Make sure you check your oxygen saturation  AT  your highest level of activity (not after you stop)   to be sure it stays over 90%  We will have the [employer] rep call you re supplying you with a portable oxygen concentrator but it may not be enough to keep your 02 sats above 90% which is the goal here. My office will be contacting you by phone for referral for High resolution CT of chest Continue Nexium 40 mg Take 30-60 min before first meal of the day  GERD diet  We will schedule a return to my office in [place] with PFTs same day.     06/26/2022  f/u ov/ re: PF maint on prn saba and 02   Chief Complaint  Patient presents with   Follow-up    DOE  Dyspnea:  across the parking lot from [facility] parking/ drives to [place]/ still working sitting answering phone  Cough: varies s pattern but dry and mostly daytime  Sleeping: ok  30 degrees elevation  SABA use: none  02: 2-3lpm prn / not using 02 as rec  Rec Add pepcid 20 mg with evening pills  Add  02 3lpm at bedtime and during the day as much as you can with goal of keeping saturations above 90% at all time    06/14/2023  f/u ov/[place] office/ re: PF maint on 02   Chief Complaint  Patient presents with   DOE  Dyspnea:  using rollator x 15 min circular drive stopping maybe once on 3lpm cont lowest 90%   Cough: min dry assoc pnd Sleeping: 45 degrees no resp cc  SABA use: none  02: 3lpm hs and prn at rest up to 3lpm cont with ex    No obvious day to day or daytime variability or assoc excess/ purulent sputum or mucus plugs or hemoptysis or cp or chest tightness, subjective wheeze or overt sinus or hb symptoms.     Also denies any obvious fluctuation of symptoms with weather or environmental changes or other aggravating or alleviating factors except as outlined above   No unusual exposure hx or h/o childhood pna/ asthma or knowledge of premature birth.  Current Allergies, Complete Past Medical History, Past Surgical History, Family History, and Social History were reviewed in [employer] record.  ROS  The following  are not active complaints unless bolded Hoarseness, sore throat, dysphagia, dental problems, itching, sneezing,  nasal congestion or discharge of excess mucus or purulent secretions, ear ache,   fever, chills, sweats, unintended wt loss or wt gain, classically pleuritic or exertional cp,  orthopnea pnd or arm/hand swelling  or leg swelling, presyncope, palpitations, abdominal pain, anorexia, nausea, vomiting, diarrhea  or change in bowel habits or change in bladder habits, change in stools or change in urine, dysuria, hematuria,  rash, arthralgias, visual complaints, headache, numbness, weakness or ataxia or problems with walking/ uses rollator  or coordination,  change in mood or  memory.        Current Meds  Medication Sig   aspirin EC 81 MG tablet Take 81 mg by mouth at bedtime.    carvedilol (COREG) 12.5 MG tablet Take 12.5 mg by mouth 2  (two) times daily with a meal. Take 1/2 tablet by 2 times daily   celecoxib (CELEBREX) 100 MG capsule Take 1 capsule (100 mg total) by mouth 2 (two) times daily.   cloNIDine (CATAPRES) 0.3 MG tablet Take 0.15 mg by mouth 2 (two) times daily.   diltiazem (CARDIZEM CD) 180 MG 24 hr capsule Take 180 mg by mouth daily.    empagliflozin (JARDIANCE) 10 MG TABS tablet Take 1 tablet (10 mg total) by mouth daily before breakfast.   esomeprazole (NEXIUM) 40 MG capsule Take 1 capsule (40 mg total) by mouth every morning.   furosemide (LASIX) 40 MG tablet Take 1.5 tablets (60 mg total) by mouth daily.   hydroxyurea (HYDREA) 500 MG capsule Take 2 capsules (1,000 mg total) by mouth every Monday, Wednesday, and Friday AND 3 capsules (1,500 mg total) every Tuesday, Thursday, Saturday, and Sunday. You can split to 1500 mg dose into 2 tablets in the AM and 1 tablet PM..   isosorbide mononitrate (IMDUR) 30 MG 24 hr tablet TAKE 1/2 (ONE-HALF) TABLET BY MOUTH AT BEDTIME   letrozole (FEMARA) 2.5 MG tablet Take 1 tablet by mouth once daily   losartan (COZAAR) 100 MG tablet Take 100 mg by mouth every evening.    Multiple Vitamins-Minerals (PRESERVISION AREDS PO) Take by mouth.   Simethicone (GAS RELIEF PO) Take 1 tablet by mouth daily.   simvastatin (ZOCOR) 10 MG tablet Take 10 mg by mouth every evening.    Vitamin D, Ergocalciferol, (DRISDOL) 1.25 MG (50000 UNIT) CAPS capsule Take 1 capsule (50,000 Units total) by mouth every 14 (fourteen) days.                Past Medical History:  Diagnosis Date   Arthritis    Chronic diastolic heart failure (HCC)    Essential hypertension    GERD (gastroesophageal reflux disease)    Gout    Hiatal hernia    History of endometrial cancer    Sp hysterectomy 2012   Ischemic colitis (HCC) 2016   Myeloproliferative disorder, JAK-2 positive    Obesity    Pneumonia    Polycythemia vera(238.4)    TIA (transient ischemic attack)        Objective:    Wts  06/14/2023          23 0   06/26/22 240 lb 3.2 oz (109 kg)  05/23/22 244 lb (110.7 kg)  05/18/22 238 lb 6.4 oz (108.1 kg)    Vital signs reviewed  06/14/2023  - Note at rest 02 sats  94% on RA p arrived on 02   General appearance:    amb pleasant wf  nad  using rollator      HEENT : Oropharynx  clear/ implants / no teeth          NECK :  without  apparent JVD/ palpable Nodes/TM    LUNGS: no acc muscle use,  Nl contour chest with minimal insp crackles in bases  bilaterally without cough on insp or exp maneuvers   CV:  RRR  no s3 3/6 SEM with slt  increase in P2, and 1-2+ ptting knees down bilaterally   ABD:  soft and nontender with nl inspiratory excursion in the supine position. No bruits or organomegaly appreciated   MS:   ext warm without deformities Or obvious joint restrictions  calf tenderness, cyanosis or clubbing    SKIN: warm and dry without lesions    NEURO:  alert, approp, nl sensorium with  no motor or cerebellar deficits apparent.          Assessment

## 2023-06-13 NOTE — Patient Instructions (Signed)
[facility] at [facility] Discharge Instructions  You were seen today by Rojelio Brenner PA-C for the following conditions.  ESSENTIAL THROMBOCYTOSIS / POLYCYTHEMIA VERA: Your blood count remains slightly higher than your target range.   You will need to INCREASE your as follows:  Take Hydrea 1000 mg (2 tablets) every morning on Monday, Wednesday, and Friday. Take Hydrea 1000 mg (2 tablets) every morning PLUS 500 mg (1 tablet) every evening on Tuesday, Thursday, Saturday, and Sunday.. We will check labs and see you for office visit again in 3 months, and see if you need additional dose adjustment. Continue aspirin 81 mg daily. If you have any new symptoms or side effects before your next visit, please call our office to let us know!  HISTORY OF LEFT BREAST CANCER: - Continue to take letrozole (Femara) as prescribed for your breast cancer. - You did not have any evidence of returning breast cancer on your most recent mammogram or physical exam. - Your next mammogram and breast exam will be due in July 2025  BONE HEALTH: Your bone density scan shows some slight worsening of your bone density, which now classifies as OSTEOPENIA.  Your osteopenia (weakened bones) is mild, but does place you at an increased risk of fracture.  However, you do not need any medical treatment of your osteopenia at this time.  We will check repeat bone density scan in 2 years.  MEDICATIONS: - Continue hydroxyurea (Hydrea) as above - Continue vitamin D once per week.  FOLLOW-UP APPOINTMENT: Office visit in 3 months, after labs   Thank you for choosing [facility] at [facility] to provide your oncology and hematology care.  To afford each patient quality time with our provider, please arrive at least 15 minutes before your scheduled appointment time.   If you have a lab appointment with the Cancer Center please come in thru the Main Entrance and check in at the main  information desk.  You need to re-schedule your appointment should you arrive 10 or more minutes late.  We strive to give you quality time with our providers, and arriving late affects you and other patients whose appointments are after yours.  Also, if you no show three or more times for appointments you may be dismissed from the clinic at the providers discretion.     Again, thank you for choosing [facility].  Our hope is that these requests will decrease the amount of time that you wait before being seen by our physicians.       _____________________________________________________________  Should you have questions after your visit to [facility], please contact our office at [phone] and follow the prompts.  Our office hours are 8:00 a.m. and 4:30 p.m. Monday - Friday.  Please note that voicemails left after 4:00 p.m. may not be returned until the following business day.  We are closed weekends and major holidays.  You do have access to a nurse 24-7, just call the main number to the clinic [phone] and do not press any options, hold on the line and a nurse will answer the phone.    For prescription refill requests, have your pharmacy contact our office and allow 72 hours.    Due to Covid, you will need to wear a mask upon entering the hospital. If you do not have a mask, a mask will be given to you at the Main Entrance upon arrival. For doctor visits, patients  may have 1 support person age 56 or older with them. For treatment visits, patients can not have anyone with them due to social distancing guidelines and our immunocompromised population.

## 2023-06-13 NOTE — Progress Notes (Signed)
[name] [name], [name] [address]   CLINIC:  Medical Oncology/Hematology  PCP:  Lori Perches, MD [address] / [name] [address] [phone]   REASON FOR VISIT:  - History of left-sided breast cancer (September 2018) - JAK2 + polycythemia vera/essential thrombocytosis   PRIOR THERAPY: Left-sided lumpectomy with adjuvant XRT   CURRENT THERAPY: - Letrozole daily since January 2019 - Hydrea [current dose 2 tablets (1000 mg) daily]  BRIEF ONCOLOGIC HISTORY:   Oncology History  Malignant neoplasm of upper-outer quadrant of left breast in female, estrogen receptor positive (HCC)  07/10/2017 Surgery   Left lumpectomy: IDC grade 2, 1.3 cm, intermediate grade DCIS, margins negative, 1/2 lymph nodes positive, ER 5% positive weak staining, PR 0%, HER-2 negative ratio 1.73, Ki-67 15%, T1cN0 stage IA AJCC 8   07/10/2017 Miscellaneous   Mammaprint: Low risk, 10-year risk of recurrence untreated 10%   10/01/2017 - 11/14/2017 Radiation Therapy   Adj XRT   11/2017 -  Anti-estrogen oral therapy   Letrozole daily     CANCER STAGING:  Cancer Staging  Malignant neoplasm of upper-outer quadrant of left breast in female, estrogen receptor positive (HCC) Staging form: Breast, AJCC 8th Edition - Clinical stage from 05/22/2017: Stage IA (cT1, cN0, cM0, G1, ER+, PR-, HER2-) - Signed by Hubbard Hartshorn, NP on 06/22/2017 - Pathologic: Stage IIA (pT1c, pN1a, cM0, G2, ER+, PR-, HER2-) - Unsigned   INTERVAL HISTORY:   [name], a 82 y.o. female, returns for routine follow-up of her JAK2 PV/ET and history of left-sided breast cancer. [name] was last seen by Lori Brenner PA-C on 04/24/2023.  She is taking Hydrea 1000 mg daily, tolerating this well.  She denies any skin sores, mouth sores, or GI side effects.  She denies any vasomotor symptoms or thrombotic events.  She has progressive macular degeneration causing blurry vision, but denies  any intermittent or transient vision changes.  No B symptoms.  She has leg swelling that is equal bilaterally, chronic, and stable.  She reports significant improvement in her previously reported left breast tenderness.   She denies any breast lumps, new-onset bone pain (she has chronic left shoulder pain), B symptoms, or neurologic changes.  She is tolerating letrozole fairly well with some occasional hot flashes and leg cramps.  She reports 50% energy and 75% appetite.  She is maintaining stable weight at this time.  ASSESSMENT & PLAN:  1.  JAK2+ polycythemia vera - JAK2 [id number] mutation detected in 2015 - No prior history of thrombosis - Hydroxyurea was started in November 2019 - Current dose of Hydrea is 1000 mg daily (unable to tolerate 1500 mg daily due to increased fatigue and peripheral neuropathy) - No vasomotor symptoms, erythromelalgia, or aquagenic pruritus  - She is taking aspirin 81 mg daily. - Most recent labs (05/23/2023): Platelets 260, Hgb 15.5/hematocrit 46.3%.  CMP and LDH normal. -Therapeutic goal is platelets <400 and HCT <45.0%. - PLAN: Continue Hydrea 1000 mg daily.  Since HCT remains elevated we will add extra 500 mg tablet of Hydrea in the evening on Tuesday, Thursday, Saturday, Sunday (Hydrea 1000 mg MWF, Hydrea 1500 mg TTSS) - Labs and follow-up 3 months  - Continue aspirin 81 mg daily - Patient is aware of alarm symptoms that would prompt more immediate medical attention.   2.  T1CN1A (stage IIa) malignant neoplasm of upper-outer quadrant of left breast - Left breast biopsy on 05/29/2017 of the upper outer quadrant mass -  IDC, grade 1/2.  ER 5%, PR 0%, Ki-67 15%, HER-2 negative. - Left breast lumpectomy on 07/10/2017, IDC, grade 2, 1.3 cm, margins negative, 1/2 lymph nodes positive, pT1c, PN 1A - MammaPrint-low risk luminal A, average 10-year risk of recurrence untreated 10%. - Adjuvant XRT from 10/01/2017-11/15/2017. - Letrozole 1 mg daily started on 11/15/2017. -  BCI testing (October 2023) showed that patient would likely benefit from extended endocrine therapy.  (8.5% risk of late distant recurrence after 5 years of adjuvant endocrine therapy, compared to 2.8% - 3.6% risk of late distant recurrence after 10 total years of adjuvant endocrine therapy) - She is tolerating LETROZOLE with occasional hot flashes and leg cramps, which are improved  - Reviewed mammogram from 05/30/2023, BI-RADS Category 1, negative.  No mammographic evidence of malignancy in either breast. -  Physical exam today (06/13/2023) showed no suspicious mass.  She did have some mild tenderness and palpation of freely mobile cystic structure in left medial breast tissue  - ROS did not reveal any red flag symptoms of recurrence    - Most recent labs show baseline CBC and normal CMP - PLAN: Continue letrozole.  (She continues to have significant leg cramps, would consider switching to alternative anastrozole inhibitor.)  Goal of treatment is 10 years (through January 2029) based on BCI results. - Next mammogram due July 2025.   - Breast exam annually and as needed, next due August 2025    3.  Osteopenia with aromatase inhibitor induced bone loss - Bone density scan (05/24/2021): T score -1.0, normal - Bone density scan (05/30/2023): T-score -1.3, osteopenic - Labs (05/23/2023): Vitamin D level is 62.46, calcium 9.0 - FRAX as calculated by radiologist at time of DEXA scan showed 11.3% probability of major osteoporotic fracture within the next 10 years and 2.6% probability of hip fracture within the next 10 years. Since FRAX does not take into account risk of fracture from AI therapy, some experts adjust for this by counting patient as "RA +" when calculating FRAX.  With this adjustment, patient has 15% risk of major osteoporotic fracture and 3.7% risk of hip fracture within the next 10 years. - Pharmacologic treatment of osteopenia/osteoporosis is generally recommended in the following scenarios: T  score </= 2.5, with history of fragility factor. T score between -1.0 and -2.5 who have risks for fracture other than AI therapy. T score between -1.0 and 2.5 to have 10-year probability of hip fracture > 3% or 10-year risk of major osteoporotic fracture > 20% based on FRAX. - Patient would potentially benefit from pharmacological treatment of osteopenia (with low-dose alendronate 35 mg weekly) due to meeting criteria of 10-year probability of hip fracture >3%, when adjusted for aromatase inhibitor use  - Optimal duration of therapy not established, but would consider discontinuing after 5 years if BMD is stable and if her short-term fracture risk remains low.  If she has presence of fragility fracture or high risk during that timeframe, would consider extending therapy up to 10 years or switching to alternative therapy. - Discussed with patient that bisphosphonate therapy is generally well-tolerated, but potential complications include transient flulike symptoms, renal insufficiency, hypocalcemia, atypical femur fractures, and osteonecrosis of the jaw. - We discussed the importance of maintaining adequate calcium and vitamin D and continuing with weightbearing exercises to improve bone mass. - PLAN: Continue vitamin D 50,000 units every other week. - We discussed her mild osteopenia and potential benefit from low-dose bisphosphonate.  Since osteopenia is mild, she agrees with holding off  on pharmacologic treatment at this time. - In the setting of aromatase inhibitor use, we will continue to check bone density scan every 2 to 3 years, next due July 2026.  Will check vitamin D with next labs   4.  Social/family history: - She was recently widowed in February 2022.  She is living independently.  She works part-time for 6 hours a day, office job.  She is non-smoker. - Father had lung cancer.  PLAN SUMMARY:  >> Labs in 3 months = CBC/D, CMP, LDH >> OFFICE visit in 3 months    REVIEW OF SYSTEMS:   Review of Systems  Constitutional:  Positive for fatigue. Negative for appetite change, chills, diaphoresis, fever and unexpected weight change.  HENT:   Negative for lump/mass and nosebleeds.   Eyes:  Negative for eye problems.  Respiratory:  Positive for shortness of breath (with exertion, baseline). Negative for cough and hemoptysis.   Cardiovascular:  Negative for chest pain, leg swelling and palpitations.  Gastrointestinal:  Negative for abdominal pain, blood in stool, constipation, diarrhea, nausea and vomiting.  Genitourinary:  Negative for hematuria.   Skin: Negative.   Neurological:  Positive for headaches. Negative for dizziness and light-headedness.  Hematological:  Does not bruise/bleed easily.  Psychiatric/Behavioral:  Positive for depression. The patient is nervous/anxious.     PHYSICAL EXAM:  Performance status (ECOG): 3 - Symptomatic, >50% confined to bed  Vitals:   06/13/23 1415  BP: 139/71  Pulse: 63  Resp: 16  Temp: 99 F (37.2 C)  SpO2: 96%   Wt Readings from Last 3 Encounters:  06/13/23 232 lb 5.8 oz (105.4 kg)  04/30/23 230 lb (104.3 kg)  04/24/23 230 lb 2.6 oz (104.4 kg)   Physical Exam Constitutional:      Appearance: Normal appearance. She is obese.  Cardiovascular:     Heart sounds: Murmur (Left sternal border, grade 4/6, systolic) heard.  Pulmonary:     Breath sounds: Normal breath sounds.  Chest:  Breasts:    Right: Normal.     Left: Tenderness present.    Musculoskeletal:     Right lower leg: Edema present.     Left lower leg: Edema present.  Lymphadenopathy:     Upper Body:     Right upper body: No supraclavicular, axillary or pectoral adenopathy.     Left upper body: No supraclavicular, axillary or pectoral adenopathy.  Neurological:     General: No focal deficit present.     Mental Status: Mental status is at baseline.  Psychiatric:        Behavior: Behavior normal. Behavior is cooperative.      PAST MEDICAL/SURGICAL  HISTORY:  Past Medical History:  Diagnosis Date   Arthritis    Chronic diastolic heart failure (HCC)    Essential hypertension    GERD (gastroesophageal reflux disease)    Gout    Hiatal hernia    History of endometrial cancer    Sp hysterectomy 2012   Ischemic colitis (HCC) 2016   Myeloproliferative disorder, JAK-2 positive    Obesity    Pneumonia    Polycythemia vera(238.4)    TIA (transient ischemic attack)    Past Surgical History:  Procedure Laterality Date   ABDOMINAL HYSTERECTOMY  09/12/2011   Procedure: HYSTERECTOMY ABDOMINAL;  Surgeon: Laurette Schimke, MD;  Location: [facility];  Service: Gynecology;  Laterality: N/A;  Total Abdominal Hysterectomy, Bilateral Salpingo Oophorectomy   BREAST LUMPECTOMY Left 07/10/2017   LEFT BREAST LUMPECTOMY WITH RADIOACTIVE SEED AND SENTINEL LYMPH  NODE BIOPSY    BREAST LUMPECTOMY WITH RADIOACTIVE SEED AND SENTINEL LYMPH NODE BIOPSY Left 07/10/2017   Procedure: LEFT BREAST LUMPECTOMY WITH RADIOACTIVE SEED AND SENTINEL LYMPH NODE BIOPSY;  Surgeon: Emelia Loron, MD;  Location[facility] OR;  Service: General;  Laterality: Left;   COLONOSCOPY  07/05/2012   Procedure: COLONOSCOPY;  Surgeon: Malissa Hippo, MD;  Location: [facility];  Service: Endoscopy;  Laterality: N/A;  730   EYE SURGERY     bilateral 8 - 12 yrs ago   FLEXIBLE SIGMOIDOSCOPY N/A 04/03/2015   Procedure: FLEXIBLE SIGMOIDOSCOPY;  Surgeon: Malissa Hippo, MD;  Location: [facility];  Service: Endoscopy;  Laterality: N/A;   HYSTEROSCOPY WITH D & C  08/15/2011   Procedure: DILATATION AND CURETTAGE (D&C) /HYSTEROSCOPY;  Surgeon: Tilda Burrow, MD;  Location: [facility];  Service: Gynecology;  Laterality: N/A;  With Suction Curette   JOINT REPLACEMENT     left knee replaced 2014   KNEE ARTHROPLASTY Left 03/15/2015   Procedure: COMPUTER ASSISTED TOTAL KNEE ARTHROPLASTY;  Surgeon: Eldred Manges, MD;  Location: [facility] OR;  Service: Orthopedics;  Laterality: Left;   SALPINGOOPHORECTOMY  09/12/2011    Procedure: SALPINGO OOPHERECTOMY;  Surgeon: Laurette Schimke, MD;  Location: [facility];  Service: Gynecology;  Laterality: Bilateral;    SOCIAL HISTORY:  Social History   Socioeconomic History   Marital status: Widowed    Spouse name: Not on file   Number of children: 2   Years of education: 12   Highest education level: High school graduate  Occupational History   Not on file  Tobacco Use   Smoking status: Never    Passive exposure: Never   Smokeless tobacco: Never  Vaping Use   Vaping status: Never Used  Substance and Sexual Activity   Alcohol use: No   Drug use: No   Sexual activity: Not Currently    Partners: Male    Birth control/protection: Surgical    Comment: hysterectomy  Other Topics Concern   Not on file  Social History Narrative   Not on file   Social Determinants of Health   Financial Resource Strain: Low Risk  (09/11/2022)   Overall Financial Resource Strain (CARDIA)    Difficulty of Paying Living Expenses: Not hard at all  Food Insecurity: No Food Insecurity (09/11/2022)   Hunger Vital Sign    Worried About Running Out of Food in the Last Year: Never true    Ran Out of Food in the Last Year: Never true  Transportation Needs: No Transportation Needs (09/11/2022)   PRAPARE - Administrator, Civil Service (Medical): No    Lack of Transportation (Non-Medical): No  Physical Activity: Sufficiently Active (09/11/2022)   Exercise Vital Sign    Days of Exercise per Week: 5 days    Minutes of Exercise per Session: 30 min  Stress: No Stress Concern Present (09/11/2022)   [employer] of Occupational Health - Occupational Stress Questionnaire    Feeling of Stress : Only a little  Social Connections: Moderately Integrated (09/11/2022)   Social Connection and Isolation Panel [NHANES]    Frequency of Communication with Friends and Family: More than three times a week    Frequency of Social Gatherings with Friends and Family: More than three times a week     Attends Religious Services: More than 4 times per year    Active Member of [employer] or Organizations: Yes    Attends Banker Meetings: More than 4 times  per year    Marital Status: Widowed  Intimate Partner Violence: Not At Risk (09/11/2022)   Humiliation, Afraid, Rape, and Kick questionnaire    Fear of Current or Ex-Partner: No    Emotionally Abused: No    Physically Abused: No    Sexually Abused: No    FAMILY HISTORY:  Family History  Problem Relation Age of Onset   Hypertension Mother    Lung cancer Father    Arthritis/Rheumatoid Sister    Anesthesia problems Neg Hx    Hypotension Neg Hx    Malignant hyperthermia Neg Hx    Pseudochol deficiency Neg Hx     CURRENT MEDICATIONS:  Current Outpatient Medications  Medication Sig Dispense Refill   aspirin EC 81 MG tablet Take 81 mg by mouth at bedtime.      carvedilol (COREG) 12.5 MG tablet Take 12.5 mg by mouth 2 (two) times daily with a meal. Take 1/2 tablet by 2 times daily     celecoxib (CELEBREX) 100 MG capsule Take 1 capsule (100 mg total) by mouth 2 (two) times daily. 60 capsule 0   cloNIDine (CATAPRES) 0.3 MG tablet Take 0.15 mg by mouth 2 (two) times daily.     diltiazem (CARDIZEM CD) 180 MG 24 hr capsule Take 180 mg by mouth daily.      empagliflozin (JARDIANCE) 10 MG TABS tablet Take 1 tablet (10 mg total) by mouth daily before breakfast. 30 tablet 6   esomeprazole (NEXIUM) 40 MG capsule Take 1 capsule (40 mg total) by mouth every morning. 30 capsule 11   furosemide (LASIX) 40 MG tablet Take 1.5 tablets (60 mg total) by mouth daily. 135 tablet 3   isosorbide mononitrate (IMDUR) 30 MG 24 hr tablet TAKE 1/2 (ONE-HALF) TABLET BY MOUTH AT BEDTIME 45 tablet 1   letrozole (FEMARA) 2.5 MG tablet Take 1 tablet by mouth once daily 90 tablet 0   losartan (COZAAR) 100 MG tablet Take 100 mg by mouth every evening.      Multiple Vitamins-Minerals (PRESERVISION AREDS PO) Take by mouth.     Simethicone (GAS RELIEF PO) Take 1  tablet by mouth daily.     simvastatin (ZOCOR) 10 MG tablet Take 10 mg by mouth every evening.      Vitamin D, Ergocalciferol, (DRISDOL) 1.25 MG (50000 UNIT) CAPS capsule Take 1 capsule (50,000 Units total) by mouth every 14 (fourteen) days. 6 capsule 3   hydroxyurea (HYDREA) 500 MG capsule Take 2 capsules (1,000 mg total) by mouth every Monday, Wednesday, and Friday AND 3 capsules (1,500 mg total) every Tuesday, Thursday, Saturday, and Sunday. You can split to 1500 mg dose into 2 tablets in the AM and 1 tablet PM.. 80 capsule 3   No current facility-administered medications for this visit.    ALLERGIES:  Allergies  Allergen Reactions   Carbamazepine Hives and Other (See Comments)    headache   Sulfa Antibiotics     Rash    LABORATORY DATA:  I have reviewed the labs as listed.     Latest Ref Rng & Units 05/23/2023   10:34 AM 04/16/2023    1:40 PM 03/14/2023    1:13 PM  CBC  WBC 4.0 - 10.5 K/uL 5.4  5.8  6.6   Hemoglobin 12.0 - 15.0 g/dL 29.5  28.4  13.2   Hematocrit 36.0 - 46.0 % 46.3  45.5  50.2   Platelets 150 - 400 K/uL 260  281  300       Latest Ref  Rng & Units 05/23/2023   10:34 AM 04/16/2023    1:40 PM 02/05/2023    1:08 PM  CMP  Glucose 70 - 99 mg/dL 93  94  94   BUN 8 - 23 mg/dL 17  20  23    Creatinine 0.44 - 1.00 mg/dL 5.63  8.75  6.43   Sodium 135 - 145 mmol/L 141  137  138   Potassium 3.5 - 5.1 mmol/L 3.8  3.9  4.1   Chloride 98 - 111 mmol/L 105  101  101   CO2 22 - 32 mmol/L 31  28  28    Calcium 8.9 - 10.3 mg/dL 9.0  9.2  9.1   Total Protein 6.5 - 8.1 g/dL 6.9  6.9  7.4   Total Bilirubin 0.3 - 1.2 mg/dL 1.0  0.9  1.1   Alkaline Phos 38 - 126 U/L 83  83  88   AST 15 - 41 U/L 17  15  16    ALT 0 - 44 U/L 12  11  10      DIAGNOSTIC IMAGING:  I have independently reviewed the scans and discussed with the patient. MM 3D SCREENING MAMMOGRAM BILATERAL BREAST  Result Date: 05/31/2023 CLINICAL DATA:  Screening. EXAM: DIGITAL SCREENING BILATERAL MAMMOGRAM WITH  TOMOSYNTHESIS AND CAD TECHNIQUE: Bilateral screening digital craniocaudal and mediolateral oblique mammograms were obtained. Bilateral screening digital breast tomosynthesis was performed. The images were evaluated with computer-aided detection. COMPARISON:  Previous exam(s). ACR Breast Density Category b: There are scattered areas of fibroglandular density. FINDINGS: There are no findings suspicious for malignancy. IMPRESSION: No mammographic evidence of malignancy. A result letter of this screening mammogram will be mailed directly to the patient. RECOMMENDATION: Screening mammogram in one year. (Code:[id number]-B-01Y) BI-RADS CATEGORY  1: Negative. Electronically Signed   By: Frederico Hamman M.D.   On: 05/31/2023 16:26   DG Bone Density  Result Date: 05/30/2023 EXAM: DUAL X-RAY ABSORPTIOMETRY (DXA) FOR BONE MINERAL DENSITY IMPRESSION: Your patient [name] completed a BMD test on 05/30/2023 using the [employer] DXA System (software version: 14.10) manufactured by [employer]. The following summarizes the results of our evaluation. Technologist: KRK PATIENT BIOGRAPHICAL: Name: [name], [name] Patient ID: [id number] Birth Date: 1941/07/13 Height: 64.0 in. Gender: Female Exam Date: 05/30/2023 Weight: 230.0 lbs. Indications: Bilateral Oophrectomy, Caucasian, Height Loss, Hx Breast Ca, Post Menopausal Fractures: Treatments: Asprin, Letrozole, Vitamin D DENSITOMETRY RESULTS: Site         Region     Measured Date Measured Age WHO Classification Young Adult T-score BMD         %Change vs. Previous Significant Change (*) DualFemur Neck Left 05/30/2023 83.4 Osteopenia -1.3 0.857 g/cm2 -8.1% Yes DualFemur Neck Left 05/24/2021 81.4 Normal -0.8 0.933 g/cm2 -3.2% - DualFemur Neck Left 12/17/2017 78.0 Normal -0.5 0.964 g/cm2 -4.4% Yes DualFemur Neck Left 02/08/2010 70.1 Normal -0.2 1.008 g/cm2 - - DualFemur Total Mean 05/30/2023 83.4 Normal -0.6 0.928 g/cm2 -5.3% Yes DualFemur Total Mean 05/24/2021 81.4  Normal -0.2 0.980 g/cm2 -9.6% Yes DualFemur Total Mean 12/17/2017 78.0 - - 1.084 g/cm2 -8.2% Yes DualFemur Total Mean 02/08/2010 70.1 Normal 1.4 1.181 g/cm2 - - Left Forearm Radius 33% 05/30/2023 83.4 Normal -0.8 0.654 g/cm2 3.6% - Left Forearm Radius 33% 05/24/2021 81.4 Normal -1.0 0.631 g/cm2 -15.5% Yes Left Forearm Radius 33% 12/17/2017 78.0 Normal 0.5 0.747 g/cm2 - - ASSESSMENT: The BMD measured at Femur Neck Left is 0.857 g/cm2 with a T-score of -1.3. This patient is considered osteopenic according to World  Health Organization [place] [facility] [place]) criteria. The scan quality is good. Lumbar spine was excluded due to advanced degenerative changes. Compared with the prior study on 05/24/2021, the BMD of the total mean shows a statistically significant increase. World Science writer Emma Pendleton Bradley Stafford) criteria for post-menopausal, Caucasian Women: Normal:       T-score at or above -1 SD Osteopenia:   T-score between -1 and -2.5 SD Osteoporosis: T-score at or below -2.5 SD RECOMMENDATIONS: 1. All patients should optimize calcium and vitamin D intake. 2. Consider FDA-approved medical therapies in postmenopausal women and med aged 28 years and older, based on the following: a. A hip or vertebral (clinical or morphometric) fracture b. T-score< -2.5 at the femoral neck or spine after appropriate evaluation to exclude secondary causes c. Low bone mass (T-score between -1.0 and -2.5 at the femoral neck or spine) and a 10-year probability of a hip fracture > 3% or a 10-year probability of a major osteoporosis-related fracture > 20% based on the [place]-adapted WHO algorithm d. Clinician judgment and/or patient preferences may indicate treatment for people with 10-year fracture probabilities above or below these levels FOLLOW-UP: Patients with diagnosis of osteoporsis or at high risk for fracture should have regular bone mineral density tests. For patients eligible for Medicare, routine testing is allowed once every 2 years. The testing frequency can  be increased to one year for patients who have rapidly progressing disease, those who are receiving or discontinuing medical therapy to restore bone mass, or have additional risk factors. I have reviewed this report, and agree with the above findings. [facility] Your patient [name] completed a FRAX assessment on 05/30/2023 using the [employer] DXA System (analysis version: 14.10) manufactured by [employer]. The following summarizes the results of our evaluation. PATIENT BIOGRAPHICAL: Name: [name], [name] Patient ID: [id number] Birth Date: [birth year] Height:    64.0 in. Gender:     Female    [age]       Weight:    230.0 lbs. Ethnicity:  White                            Exam Date: 05/30/2023 FRAX* RESULTS:  (version: 3.5) 10-year Probability of Fracture1 Major Osteoporotic Fracture2 Hip Fracture 11.3% 2.6% Population: [place] (Caucasian) Risk Factors: None Based on DualFemur (Left) Neck BMD 1 -The 10-year probability of fracture may be lower than reported if the patient has received treatment. 2 -Major Osteoporotic Fracture: Clinical Spine, Forearm, Hip or Shoulder *FRAX is a Armed forces logistics/support/administrative officer of the [employer] of [employer] for Metabolic Bone Disease, a World Science writer (WHO) [employer]. ASSESSMENT: The probability of a major osteoporotic fracture is 11.3% within the next ten years. The probability of a hip fracture is 2.6% within the next ten years. Electronically Signed   By: Baird Lyons M.D.   On: 05/30/2023 14:04     WRAP UP:  All questions were answered. The patient knows to call the clinic with any problems, questions or concerns.  Medical decision making: Moderate  Time spent on visit: I spent 20 minutes counseling the patient face to face. The total time spent in the appointment was 30 minutes and more than 50% was on counseling.  Carnella Guadalajara, PA-C  06/13/23.now

## 2023-06-14 ENCOUNTER — Ambulatory Visit: Payer: Medicare Other | Admitting: Internal Medicine

## 2023-06-14 ENCOUNTER — Encounter: Payer: Self-pay | Admitting: Internal Medicine

## 2023-06-14 VITALS — BP 191/80 | HR 81 | Ht 64.0 in | Wt 230.0 lb

## 2023-06-14 DIAGNOSIS — J9611 Chronic respiratory failure with hypoxia: Secondary | ICD-10-CM | POA: Diagnosis not present

## 2023-06-14 DIAGNOSIS — J8489 Other specified interstitial pulmonary diseases: Secondary | ICD-10-CM

## 2023-06-14 DIAGNOSIS — R0609 Other forms of dyspnea: Secondary | ICD-10-CM

## 2023-06-14 MED ORDER — FAMOTIDINE 20 MG PO TABS: ORAL_TABLET | ORAL | 11 refills | Status: DC

## 2023-06-14 NOTE — Assessment & Plan Note (Signed)
See HRCT  06/19/22   - prob NSIP /  also large HH and  PH   No evidence for progression and no obvious assoc rheumatologic dz or risk factors for HSP so continue rx just for gerd Use of PPI is associated with improved survival time and with decreased radiologic fibrosis per King's study published in AJRCCM vol 184 p1390.  Dec 2011 and also may have other beneficial effects as per the latest review in [employer] vol 193 p1345 Jun 20016.  Discussed in detail all the  indications, usual  risks and alternatives  relative to the benefits with patient who agrees to proceed with Rx as outlined.      F/u q 3 m, soone if needed  Each maintenance medication was reviewed in detail including emphasizing most importantly the difference between maintenance and prns and under what circumstances the prns are to be triggered using an action plan format where appropriate.  Total time for H and P, chart review, counseling, reviewing 02/pulse ox  device(s) , directly observing portions of ambulatory 02 saturation study/ and generating customized AVS unique to this office visit / same day charting = 30 min

## 2023-06-14 NOTE — Assessment & Plan Note (Signed)
Onset 2012 p abd hysterectomy at wt 272  - Echo  09/13/21 1. Left ventricular ejection fraction, by estimation, is 65 to 70%. The  left ventricle has normal function. The left ventricle has no regional  wall motion abnormalities. There is moderate left ventricular hypertrophy.  Left ventricular diastolic  parameters are indeterminate.   2. Right ventricular systolic function is normal. The right ventricular  size is normal. There is severely elevated pulmonary artery systolic  pressure.   3. Left atrial size was severely dilated.   4. The mitral valve is abnormal. No evidence of mitral valve  regurgitation. Mild to moderate mitral stenosis. Moderate mitral annular  calcification.   5. The tricuspid valve is abnormal.   6. The aortic valve is tricuspid. There is moderate calcification of the  aortic valve. There is moderate thickening of the aortic valve. Aortic  valve regurgitation is not visualized. Mild aortic valve stenosis. Aortic  valve mean gradient measures 10.0  mmHg. Aortic valve peak gradient measures 22.7 mmHg. Aortic valve area, by  VTI measures 2.11 cm   7. The inferior vena cava is dilated in size with >50% respiratory  variability, suggesting right atrial pressure of 8 mmHg.  8.Right Atrium: Right atrial size was normal in size -  PFT's  03/23/11   FEV1 1.31 (61 % ) ratio 0.71  p 0 % improvement from saba p ? prior to study with DLCO  19.4 (73% %)  and FV curve min concave and ERV  370 cc at wt 270   - PFT's  06/26/2022   FEV1 1.11(62 % ) ratio 0.88  p 0 % improvement from saba p nothing prior to study with DLCO  13.73 (76%)  and FV curve nl  and ERV 150 (44%)  at wt 240  Multifactorial probably more related to elevated LA pressure than ILD which appears stable though 02 dep with exertion (see separate a/p)    >>> f/u with Dr Diona Browner planned re today's high bp

## 2023-06-14 NOTE — Patient Instructions (Addendum)
Pantoprazole (protonix) 40 mg   Take  30-60 min before first meal of the day and add Pepcid (famotidine)  20 mg after supper until return to office - this is the best way to tell whether stomach acid is contributing to your problem.    GERD (REFLUX)  is an extremely common cause of respiratory symptoms just like yours , many times with no obvious heartburn at all.    It can be treated with medication, but also with lifestyle changes including  , avoidance of late meals, excessive alcohol, and avoid fatty foods, chocolate, peppermint, colas, red wine, and acidic juices such as orange juice.  NO MINT OR MENTHOL PRODUCTS SO NO COUGH DROPS  USE SUGARLESS CANDY INSTEAD (Jolley ranchers or Stover's or Life Savers) or even ice chips will also do - the key is to swallow to prevent all throat clearing. NO OIL BASED VITAMINS - use powdered substitutes.  Avoid fish oil when coughing.   For itching /sneezing / cough zyrtec 10 mg in evening as needed - works x 24 h  Make sure you check your oxygen saturation  AT  your highest level of activity (not after you stop)   to be sure it stays over 90% and adjust  02 flow upward to maintain this level if needed but remember to turn it back to previous settings when you stop (to conserve your supply).   Please schedule a follow up visit in 3 months but call sooner if needed

## 2023-06-14 NOTE — Assessment & Plan Note (Signed)
Started on 02 post op in 2012  - 05/23/2022   Walked on 3lpm  x  3  lap(s) =  approx 450  ft  @ slow/ rollator  pace, stopped due to end of study with lowest 02 sats 90%   - ONO on 2lpm 05/31/22  desat < 89% x 6 h so rec 3lpm and repeat on 3lpm 06/06/2022 >>> pt refused  So ok to leave on 2lpm for now - 06/17/22  ono on 3lpm  Only 7 min desats > no change rx - 06/14/2023   Walked on 3lpm cont  x  2  lap(s) =  approx 300  ft  @ mod fast for rollator pace, stopped due to sob with lowest 02 sats 90%    Reminded to pace herself a bit slower with rollator walking and : Make sure you check your oxygen saturation  AT  your highest level of activity (not after you stop)   to be sure it stays over 90% and adjust  02 flow upward to maintain this level if needed but remember to turn it back to previous settings when you stop (to conserve your supply).

## 2023-06-14 NOTE — Addendum Note (Signed)
Addended by: Lajoyce Lauber A on: 06/14/2023 01:59 PM   Modules accepted: Orders

## 2023-06-15 ENCOUNTER — Telehealth: Payer: Self-pay | Admitting: Internal Medicine

## 2023-06-15 NOTE — Telephone Encounter (Signed)
Pt. Has questions about the order that was placed to [employer] for POC and wants to speak to nurse about what what [employer] is making her go through to get it please advise

## 2023-06-15 NOTE — Telephone Encounter (Signed)
Spoke to rep at [employer]. They have scheduled patient for a pulsed POC eval, which is normal. She is scheduled for 06/27/23, and if passes will receive POC unit that day. If she does not pass, can attempt to obtain continuous POC unit from another company, as they only have pulsed.   Patient aware to complete POC eval and if she does not pass we will explore obtaining continuous POC.

## 2023-06-16 ENCOUNTER — Other Ambulatory Visit: Payer: Self-pay | Admitting: Cardiology

## 2023-06-18 DIAGNOSIS — H353221 Exudative age-related macular degeneration, left eye, with active choroidal neovascularization: Secondary | ICD-10-CM | POA: Diagnosis not present

## 2023-06-22 ENCOUNTER — Other Ambulatory Visit: Payer: Self-pay | Admitting: Orthopedic Surgery

## 2023-06-24 DIAGNOSIS — Z96652 Presence of left artificial knee joint: Secondary | ICD-10-CM | POA: Diagnosis not present

## 2023-06-24 DIAGNOSIS — J9601 Acute respiratory failure with hypoxia: Secondary | ICD-10-CM | POA: Diagnosis not present

## 2023-06-24 DIAGNOSIS — J449 Chronic obstructive pulmonary disease, unspecified: Secondary | ICD-10-CM | POA: Diagnosis not present

## 2023-06-25 DIAGNOSIS — H353211 Exudative age-related macular degeneration, right eye, with active choroidal neovascularization: Secondary | ICD-10-CM | POA: Diagnosis not present

## 2023-07-16 ENCOUNTER — Other Ambulatory Visit: Payer: Self-pay | Admitting: Cardiology

## 2023-07-16 DIAGNOSIS — I5030 Unspecified diastolic (congestive) heart failure: Secondary | ICD-10-CM | POA: Diagnosis not present

## 2023-07-16 DIAGNOSIS — I350 Nonrheumatic aortic (valve) stenosis: Secondary | ICD-10-CM | POA: Diagnosis not present

## 2023-07-16 DIAGNOSIS — D75839 Thrombocytosis, unspecified: Secondary | ICD-10-CM | POA: Diagnosis not present

## 2023-07-16 DIAGNOSIS — Z79899 Other long term (current) drug therapy: Secondary | ICD-10-CM | POA: Diagnosis not present

## 2023-07-16 DIAGNOSIS — I10 Essential (primary) hypertension: Secondary | ICD-10-CM | POA: Diagnosis not present

## 2023-07-27 ENCOUNTER — Telehealth: Payer: Self-pay | Admitting: Cardiology

## 2023-07-27 MED ORDER — EMPAGLIFLOZIN 10 MG PO TABS: 10.0000 mg | ORAL_TABLET | Freq: Every day | ORAL | 0 refills | Status: DC

## 2023-07-27 NOTE — Telephone Encounter (Signed)
Both medications will be the same price. However [employer] does have a good patient assistance program that many patients qualify for.

## 2023-07-27 NOTE — Telephone Encounter (Signed)
Patient has pcp apt on Monday and will stop buy and pick up Pt assistance form,Samples provided

## 2023-07-27 NOTE — Telephone Encounter (Signed)
To PharmD, would [name] be more cost effective for patient?

## 2023-07-27 NOTE — Telephone Encounter (Signed)
Pt c/o medication issue:  1. Name of Medication:   empagliflozin (JARDIANCE) 10 MG TABS tablet   2. How are you currently taking this medication (dosage and times per day)?   As prescribed  3. Are you having a reaction (difficulty breathing--STAT)?   No  4. What is your medication issue?   Patient stated she is in the gap and her new policy will not start until January.  Patient stated she only has 1 tablet left and she cannot afford to pay the increased cost for this medication.  Patient wants to get alternate medication or next steps.  Patient wants to get recommendation regarding insurance for her medication.

## 2023-07-30 DIAGNOSIS — I5032 Chronic diastolic (congestive) heart failure: Secondary | ICD-10-CM | POA: Diagnosis not present

## 2023-07-30 DIAGNOSIS — M1990 Unspecified osteoarthritis, unspecified site: Secondary | ICD-10-CM | POA: Diagnosis not present

## 2023-07-30 DIAGNOSIS — I350 Nonrheumatic aortic (valve) stenosis: Secondary | ICD-10-CM | POA: Diagnosis not present

## 2023-07-30 DIAGNOSIS — Z23 Encounter for immunization: Secondary | ICD-10-CM | POA: Diagnosis not present

## 2023-08-02 ENCOUNTER — Other Ambulatory Visit: Payer: Self-pay | Admitting: Orthopedic Surgery

## 2023-08-06 ENCOUNTER — Emergency Department (HOSPITAL_COMMUNITY): Payer: Medicare Other

## 2023-08-06 ENCOUNTER — Other Ambulatory Visit: Payer: Self-pay

## 2023-08-06 ENCOUNTER — Observation Stay (HOSPITAL_COMMUNITY)
Admission: EM | Admit: 2023-08-06 | Discharge: 2023-08-07 | Disposition: A | Discharge status: Home / Self Care | Payer: Medicare Other | Attending: Family Medicine | Admitting: Family Medicine

## 2023-08-06 ENCOUNTER — Encounter (HOSPITAL_COMMUNITY): Payer: Self-pay

## 2023-08-06 DIAGNOSIS — I5032 Chronic diastolic (congestive) heart failure: Secondary | ICD-10-CM | POA: Diagnosis not present

## 2023-08-06 DIAGNOSIS — Z7984 Long term (current) use of oral hypoglycemic drugs: Secondary | ICD-10-CM | POA: Diagnosis not present

## 2023-08-06 DIAGNOSIS — J984 Other disorders of lung: Secondary | ICD-10-CM

## 2023-08-06 DIAGNOSIS — I110 Hypertensive heart disease with heart failure: Secondary | ICD-10-CM | POA: Diagnosis not present

## 2023-08-06 DIAGNOSIS — J9611 Chronic respiratory failure with hypoxia: Secondary | ICD-10-CM | POA: Diagnosis not present

## 2023-08-06 DIAGNOSIS — K219 Gastro-esophageal reflux disease without esophagitis: Secondary | ICD-10-CM | POA: Diagnosis present

## 2023-08-06 DIAGNOSIS — Z79899 Other long term (current) drug therapy: Secondary | ICD-10-CM | POA: Insufficient documentation

## 2023-08-06 DIAGNOSIS — Z8542 Personal history of malignant neoplasm of other parts of uterus: Secondary | ICD-10-CM | POA: Insufficient documentation

## 2023-08-06 DIAGNOSIS — R0989 Other specified symptoms and signs involving the circulatory and respiratory systems: Secondary | ICD-10-CM | POA: Diagnosis not present

## 2023-08-06 DIAGNOSIS — I639 Cerebral infarction, unspecified: Secondary | ICD-10-CM | POA: Diagnosis not present

## 2023-08-06 DIAGNOSIS — J449 Chronic obstructive pulmonary disease, unspecified: Secondary | ICD-10-CM | POA: Diagnosis not present

## 2023-08-06 DIAGNOSIS — Z96652 Presence of left artificial knee joint: Secondary | ICD-10-CM | POA: Insufficient documentation

## 2023-08-06 DIAGNOSIS — I10 Essential (primary) hypertension: Secondary | ICD-10-CM | POA: Diagnosis present

## 2023-08-06 DIAGNOSIS — R29818 Other symptoms and signs involving the nervous system: Secondary | ICD-10-CM | POA: Diagnosis not present

## 2023-08-06 DIAGNOSIS — R112 Nausea with vomiting, unspecified: Secondary | ICD-10-CM | POA: Diagnosis not present

## 2023-08-06 DIAGNOSIS — I6782 Cerebral ischemia: Secondary | ICD-10-CM | POA: Diagnosis not present

## 2023-08-06 DIAGNOSIS — D471 Chronic myeloproliferative disease: Secondary | ICD-10-CM | POA: Diagnosis present

## 2023-08-06 DIAGNOSIS — R42 Dizziness and giddiness: Principal | ICD-10-CM

## 2023-08-06 DIAGNOSIS — R9389 Abnormal findings on diagnostic imaging of other specified body structures: Secondary | ICD-10-CM | POA: Diagnosis not present

## 2023-08-06 DIAGNOSIS — D7589 Other specified diseases of blood and blood-forming organs: Secondary | ICD-10-CM | POA: Diagnosis present

## 2023-08-06 DIAGNOSIS — Z7982 Long term (current) use of aspirin: Secondary | ICD-10-CM | POA: Diagnosis not present

## 2023-08-06 DIAGNOSIS — I6523 Occlusion and stenosis of bilateral carotid arteries: Secondary | ICD-10-CM | POA: Diagnosis not present

## 2023-08-06 LAB — CBC WITH DIFFERENTIAL/PLATELET
Abs Immature Granulocytes: 0.01 10*3/uL (ref 0.00–0.07)
Basophils Absolute: 0.1 10*3/uL (ref 0.0–0.1)
Basophils Relative: 1 %
Eosinophils Absolute: 0 10*3/uL (ref 0.0–0.5)
Eosinophils Relative: 1 %
HCT: 45.3 % (ref 36.0–46.0)
Hemoglobin: 15.6 g/dL — ABNORMAL HIGH (ref 12.0–15.0)
Immature Granulocytes: 0 %
Lymphocytes Relative: 17 %
Lymphs Abs: 0.8 10*3/uL (ref 0.7–4.0)
MCH: 40.6 pg — ABNORMAL HIGH (ref 26.0–34.0)
MCHC: 34.4 g/dL (ref 30.0–36.0)
MCV: 118 fL — ABNORMAL HIGH (ref 80.0–100.0)
Monocytes Absolute: 0.3 10*3/uL (ref 0.1–1.0)
Monocytes Relative: 6 %
Neutro Abs: 3.6 10*3/uL (ref 1.7–7.7)
Neutrophils Relative %: 75 %
Platelets: 211 10*3/uL (ref 150–400)
RBC: 3.84 MIL/uL — ABNORMAL LOW (ref 3.87–5.11)
RDW: 13 % (ref 11.5–15.5)
WBC: 4.7 10*3/uL (ref 4.0–10.5)
nRBC: 0 % (ref 0.0–0.2)

## 2023-08-06 LAB — HEPATIC FUNCTION PANEL
ALT: 14 U/L (ref 0–44)
AST: 19 U/L (ref 15–41)
Albumin: 3.9 g/dL (ref 3.5–5.0)
Alkaline Phosphatase: 88 U/L (ref 38–126)
Bilirubin, Direct: 0.2 mg/dL (ref 0.0–0.2)
Indirect Bilirubin: 0.6 mg/dL (ref 0.3–0.9)
Total Bilirubin: 0.8 mg/dL (ref 0.3–1.2)
Total Protein: 7.2 g/dL (ref 6.5–8.1)

## 2023-08-06 LAB — URINALYSIS, ROUTINE W REFLEX MICROSCOPIC
Bacteria, UA: NONE SEEN
Bilirubin Urine: NEGATIVE
Glucose, UA: >=500 mg/dL — AB
Hgb urine dipstick: NEGATIVE
Ketones, ur: NEGATIVE mg/dL
Leukocytes,Ua: NEGATIVE
Nitrite: NEGATIVE
Protein, ur: NEGATIVE mg/dL
Specific Gravity, Urine: 1.013 (ref 1.005–1.030)
pH: 8 (ref 5.0–8.0)

## 2023-08-06 LAB — GLUCOSE, CAPILLARY
Glucose-Capillary: 131 mg/dL — ABNORMAL HIGH (ref 70–99)
Glucose-Capillary: 120 mg/dL — ABNORMAL HIGH (ref 70–99)

## 2023-08-06 LAB — PROTIME-INR
INR: 1.1 (ref 0.8–1.2)
Prothrombin Time: 14.1 s (ref 11.4–15.2)

## 2023-08-06 LAB — BASIC METABOLIC PANEL
Anion gap: 12 (ref 5–15)
BUN: 15 mg/dL (ref 8–23)
CO2: 26 mmol/L (ref 22–32)
Calcium: 9.2 mg/dL (ref 8.9–10.3)
Chloride: 102 mmol/L (ref 98–111)
Creatinine, Ser: 0.44 mg/dL (ref 0.44–1.00)
GFR, Estimated: >60 mL/min (ref >60)
Glucose, Bld: 129 mg/dL — ABNORMAL HIGH (ref 70–99)
Potassium: 3.5 mmol/L (ref 3.5–5.1)
Sodium: 140 mmol/L (ref 135–145)

## 2023-08-06 LAB — CBG MONITORING, ED: Glucose-Capillary: 115 mg/dL — ABNORMAL HIGH (ref 70–99)

## 2023-08-06 LAB — MAGNESIUM: Magnesium: 2 mg/dL (ref 1.7–2.4)

## 2023-08-06 LAB — TROPONIN I (HIGH SENSITIVITY)
Troponin I (High Sensitivity): 5 ng/L (ref <18)
Troponin I (High Sensitivity): 5 ng/L (ref <18)

## 2023-08-06 MED ORDER — ENOXAPARIN SODIUM 40 MG/0.4ML IJ SOSY
40.0000 mg | PREFILLED_SYRINGE | INTRAMUSCULAR | Status: DC
  Filled 2023-08-06: qty 0.4

## 2023-08-06 MED ORDER — STROKE: EARLY STAGES OF RECOVERY BOOK
Freq: Once | Status: AC
  Filled 2023-08-06: qty 1

## 2023-08-06 MED ORDER — ACETAMINOPHEN 160 MG/5ML PO SOLN: 650.0000 mg | ORAL | Status: DC | PRN

## 2023-08-06 MED ORDER — CLONIDINE HCL 0.1 MG PO TABS
0.1500 mg | ORAL_TABLET | Freq: Two times a day (BID) | ORAL | Status: DC
  Administered 2023-08-07: 0.15 mg via ORAL
  Filled 2023-08-06 (×2): qty 2

## 2023-08-06 MED ORDER — INSULIN ASPART 100 UNIT/ML IJ SOLN: 0.0000 [IU] | Freq: Every day | INTRAMUSCULAR | Status: DC

## 2023-08-06 MED ORDER — SENNOSIDES-DOCUSATE SODIUM 8.6-50 MG PO TABS: 1.0000 | ORAL_TABLET | Freq: Every evening | ORAL | Status: DC | PRN

## 2023-08-06 MED ORDER — IOHEXOL 350 MG/ML SOLN
75.0000 mL | Freq: Once | INTRAVENOUS | Status: AC | PRN
  Administered 2023-08-06: 75 mL via INTRAVENOUS

## 2023-08-06 MED ORDER — INSULIN ASPART 100 UNIT/ML IJ SOLN: 0.0000 [IU] | Freq: Three times a day (TID) | INTRAMUSCULAR | Status: DC

## 2023-08-06 MED ORDER — PANTOPRAZOLE SODIUM 40 MG PO TBEC
40.0000 mg | DELAYED_RELEASE_TABLET | Freq: Every day | ORAL | Status: DC
  Administered 2023-08-06 – 2023-08-07 (×2): 40 mg via ORAL
  Filled 2023-08-06 (×2): qty 1

## 2023-08-06 MED ORDER — DIAZEPAM 5 MG/ML IJ SOLN
2.5000 mg | Freq: Once | INTRAMUSCULAR | Status: AC
  Administered 2023-08-06: 2.5 mg via INTRAVENOUS
  Filled 2023-08-06: qty 2

## 2023-08-06 MED ORDER — HYDROXYUREA 500 MG PO CAPS: 1000.0000 mg | ORAL_CAPSULE | ORAL | Status: DC

## 2023-08-06 MED ORDER — LACTATED RINGERS IV BOLUS
500.0000 mL | Freq: Once | INTRAVENOUS | Status: AC
  Administered 2023-08-06: 500 mL via INTRAVENOUS

## 2023-08-06 MED ORDER — DILTIAZEM HCL ER COATED BEADS 180 MG PO CP24
180.0000 mg | ORAL_CAPSULE | Freq: Every day | ORAL | Status: DC
  Administered 2023-08-06 – 2023-08-07 (×2): 180 mg via ORAL
  Filled 2023-08-06 (×2): qty 1

## 2023-08-06 MED ORDER — ACETAMINOPHEN 325 MG PO TABS: 650.0000 mg | ORAL_TABLET | ORAL | Status: DC | PRN

## 2023-08-06 MED ORDER — ASPIRIN 325 MG PO TABS
325.0000 mg | ORAL_TABLET | Freq: Once | ORAL | Status: AC
  Administered 2023-08-06: 325 mg via ORAL
  Filled 2023-08-06: qty 1

## 2023-08-06 MED ORDER — MECLIZINE HCL 12.5 MG PO TABS
25.0000 mg | ORAL_TABLET | Freq: Once | ORAL | Status: AC
  Administered 2023-08-06: 25 mg via ORAL
  Filled 2023-08-06: qty 2

## 2023-08-06 MED ORDER — ACETAMINOPHEN 650 MG RE SUPP: 650.0000 mg | RECTAL | Status: DC | PRN

## 2023-08-06 MED ORDER — EMPAGLIFLOZIN 10 MG PO TABS
10.0000 mg | ORAL_TABLET | Freq: Every day | ORAL | Status: DC
  Administered 2023-08-07: 10 mg via ORAL

## 2023-08-06 MED ORDER — HYDROXYUREA 500 MG PO CAPS
1500.0000 mg | ORAL_CAPSULE | ORAL | Status: DC
  Administered 2023-08-07: 1500 mg via ORAL
  Filled 2023-08-06: qty 3

## 2023-08-06 MED ORDER — LETROZOLE 2.5 MG PO TABS
2.5000 mg | ORAL_TABLET | Freq: Every day | ORAL | Status: DC
  Administered 2023-08-06 – 2023-08-07 (×2): 2.5 mg via ORAL
  Filled 2023-08-06 (×3): qty 1

## 2023-08-06 MED ORDER — FUROSEMIDE 40 MG PO TABS
40.0000 mg | ORAL_TABLET | Freq: Every day | ORAL | Status: DC
  Administered 2023-08-07: 40 mg via ORAL
  Filled 2023-08-06: qty 1

## 2023-08-06 MED ORDER — HYDROXYUREA 500 MG PO CAPS
1000.0000 mg | ORAL_CAPSULE | Freq: Once | ORAL | Status: AC
  Administered 2023-08-06: 1000 mg via ORAL
  Filled 2023-08-06: qty 2

## 2023-08-06 NOTE — H&P (Addendum)
History and Physical    Patient: [name] ION:[id number] DOB: 11-11-1940 DOA: 08/06/2023 DOS: the patient was seen and examined on 08/06/2023 PCP: Carylon Perches, MD  Patient coming from: Home  Chief Complaint:  Chief Complaint  Patient presents with   Dizziness   HPI: [name] is an 82 y.o. female with medical history significant of chronic hypoxic respiratory failure, PCV, TIA, endometrial CA s/p hysterectomy 2012, GERD w/hiatal hernia, HTN, and chronic HFpEF who presented to the ED with abrupt onset of dizziness, gait instability, and nausea this morning. She lives alone, usually gets around with a walker without issues, was in her usual state of health last night when she went to sleep. When getting up this morning at ~3:45am she felt dizzy, feeling like she was falling backward with walker. Had nausea to the point that she couldn't take her medications. She called her daughter who tried zofran without improvement, so she came in. Had an episode of vomiting. She has had no other focal weakness, numbness. Daughter says she has no change to her facial features and no dysarthria. Still works, lives alone. Work up in ED revealed acute right parietal infarct on MRI for which neurology was consulted and recommended admission. Neurology is formally consulted.   Review of Systems: As mentioned in the history of present illness. All other systems reviewed and are negative. Past Medical History:  Diagnosis Date   Arthritis    Chronic diastolic heart failure (HCC)    Essential hypertension    GERD (gastroesophageal reflux disease)    Gout    Hiatal hernia    History of endometrial cancer    Sp hysterectomy 2012   Ischemic colitis (HCC) 2016   Myeloproliferative disorder, JAK-2 positive    Obesity    Pneumonia    Polycythemia vera(238.4)    TIA (transient ischemic attack)    Past Surgical History:  Procedure Laterality Date   ABDOMINAL HYSTERECTOMY  09/12/2011   Procedure:  HYSTERECTOMY ABDOMINAL;  Surgeon: Laurette Schimke, MD;  Location: [facility];  Service: Gynecology;  Laterality: N/A;  Total Abdominal Hysterectomy, Bilateral Salpingo Oophorectomy   BREAST LUMPECTOMY Left 07/10/2017   LEFT BREAST LUMPECTOMY WITH RADIOACTIVE SEED AND SENTINEL LYMPH NODE BIOPSY    BREAST LUMPECTOMY WITH RADIOACTIVE SEED AND SENTINEL LYMPH NODE BIOPSY Left 07/10/2017   Procedure: LEFT BREAST LUMPECTOMY WITH RADIOACTIVE SEED AND SENTINEL LYMPH NODE BIOPSY;  Surgeon: Emelia Loron, MD;  Location[facility] OR;  Service: General;  Laterality: Left;   COLONOSCOPY  07/05/2012   Procedure: COLONOSCOPY;  Surgeon: Malissa Hippo, MD;  Location: [facility];  Service: Endoscopy;  Laterality: N/A;  730   EYE SURGERY     bilateral 8 - 12 yrs ago   FLEXIBLE SIGMOIDOSCOPY N/A 04/03/2015   Procedure: FLEXIBLE SIGMOIDOSCOPY;  Surgeon: Malissa Hippo, MD;  Location: [facility];  Service: Endoscopy;  Laterality: N/A;   HYSTEROSCOPY WITH D & C  08/15/2011   Procedure: DILATATION AND CURETTAGE (D&C) /HYSTEROSCOPY;  Surgeon: Tilda Burrow, MD;  Location: [facility];  Service: Gynecology;  Laterality: N/A;  With Suction Curette   JOINT REPLACEMENT     left knee replaced 2014   KNEE ARTHROPLASTY Left 03/15/2015   Procedure: COMPUTER ASSISTED TOTAL KNEE ARTHROPLASTY;  Surgeon: Eldred Manges, MD;  Location: [facility] OR;  Service: Orthopedics;  Laterality: Left;   SALPINGOOPHORECTOMY  09/12/2011   Procedure: SALPINGO OOPHERECTOMY;  Surgeon: Laurette Schimke, MD;  Location: [facility];  Service: Gynecology;  Laterality: Bilateral;  Social History:  reports that she has never smoked. She has never been exposed to tobacco smoke. She has never used smokeless tobacco. She reports that she does not drink alcohol and does not use drugs.  Allergies  Allergen Reactions   Carbamazepine Hives and Other (See Comments)    headache   Sulfa Antibiotics     Rash    Family History  Problem Relation Age of Onset   Hypertension Mother     Lung cancer Father    Arthritis/Rheumatoid Sister    Anesthesia problems Neg Hx    Hypotension Neg Hx    Malignant hyperthermia Neg Hx    Pseudochol deficiency Neg Hx     Prior to Admission medications   Medication Sig Start Date End Date Taking? Authorizing Provider  acetaminophen (TYLENOL) 650 MG CR tablet Take 1,300 mg by mouth 2 (two) times daily.   Yes [provider]  aspirin EC 81 MG tablet Take 81 mg by mouth at bedtime.    Yes [provider]  carvedilol (COREG) 12.5 MG tablet Take 12.5 mg by mouth 2 (two) times daily with a meal. Take 1/2 tablet by 2 times daily   Yes [provider]  cloNIDine (CATAPRES) 0.3 MG tablet Take 0.15 mg by mouth 2 (two) times daily.   Yes [provider]  diltiazem (CARDIZEM CD) 180 MG 24 hr capsule Take 180 mg by mouth daily.  03/05/13  Yes [provider]  empagliflozin (JARDIANCE) 10 MG TABS tablet TAKE 1 TABLET BY MOUTH ONCE DAILY BEFORE BREAKFAST 06/18/23  Yes Jonelle Sidle, MD  esomeprazole (NEXIUM) 40 MG capsule Take 1 capsule (40 mg total) by mouth every morning. 09/15/14  Yes Setzer, Brand Males, NP  famotidine (PEPCID) 20 MG tablet One after supper 06/14/23  Yes [name], MD  furosemide (LASIX) 40 MG tablet Take 1.5 tablets (60 mg total) by mouth daily. Patient taking differently: Take 40 mg by mouth daily. 04/06/22  Yes Jonelle Sidle, MD  hydroxyurea (HYDREA) 500 MG capsule Take 2 capsules (1,000 mg total) by mouth every Monday, Wednesday, and Friday AND 3 capsules (1,500 mg total) every Tuesday, Thursday, Saturday, and Sunday. You can split to 1500 mg dose into 2 tablets in the AM and 1 tablet PM.. 06/13/23  Yes Pennington, Rebekah M, PA-C  isosorbide mononitrate (IMDUR) 30 MG 24 hr tablet TAKE 1/2 (ONE-HALF) TABLET BY MOUTH AT BEDTIME Patient taking differently: 15 mg at bedtime. 07/17/23  Yes Jonelle Sidle, MD  letrozole [facility]) 2.5 MG tablet Take 1 tablet by mouth once daily 01/23/23   Yes Doreatha Massed, MD  losartan (COZAAR) 100 MG tablet Take 100 mg by mouth every evening.  02/25/13  Yes [provider]  Multiple Vitamins-Minerals (PRESERVISION AREDS PO) Take by mouth.   Yes [provider]  Simethicone (GAS RELIEF PO) Take 1 tablet by mouth daily.   Yes [provider]  simvastatin (ZOCOR) 10 MG tablet Take 10 mg by mouth every evening.    Yes [provider]  Vitamin D, Ergocalciferol, (DRISDOL) 1.25 MG (50000 UNIT) CAPS capsule Take 1 capsule (50,000 Units total) by mouth every 14 (fourteen) days. 11/28/22  Yes Carnella Guadalajara, PA-C    Physical Exam: Vitals:   08/06/23 1330 08/06/23 1445 08/06/23 1447 08/06/23 1547  BP:  139/89  (!) 178/78  Pulse: 78 76 76 74  Resp: 19 (!) 21 (!) 21 18  Temp:    98.1 F (36.7 C)  TempSrc:  Oral  SpO2: 95% 91% 94% 92%  Weight:      Height:      Gen: No distress Pulm: Clear, nonlabored  CV: RRR, III/VI systolic murmur throughout precordium. 1+ pitting LE edema GI: Soft, NT, ND, +BS Neuro: Alert and oriented, mild left ptosis which her daughter feels is at baseline. +R arcus senilis. No focal neurological deficits, speech normal. Ext: Warm, no deformities Skin: No acute rashes, lesions or ulcers on visualized skin   Data Reviewed: Hgb 15.6, MCV 118, MCH 40.6. Plt, WBC normal Troponin 5 > 5 CMP unremarkable with glucose 129.  UA: +glucose (on SGLT2i), otherwise negative. INR 1.1.  ECG: NSR w/RBBB CXR: vascular congestion, no infiltrate or effusions.  MRI brain:  1. Punctate acute infarct in the right parietal lobe. No hemorrhage. 2. Sequela of severe chronic microvascular ischemic change. 3. Scattered microhemorrhages predominantly lobar location, which can be seen in the setting of cerebral amyloid angiopathy. 4. Degenerative changes in the upper cervical spine with likely at least moderate spinal canal narrowing C4-C5.  CTA H/N: No LVO  Assessment and Plan: Acute  CVA:  - Neurology, Dr. Melynda Ripple, formally consulted.  - Given aspirin, continue statin (home is simva 10mg ), though likely escalate. Will check FLP and HbA1c - Allow permissive HTN for now.  - Continue cardiac monitoring. Note hx reported of "dysrhythmia" (?for which she takes diltiazem) though no AFib that I can find.   Chronic HFpEF, HTN:  - Will check echo as above, so will not need echo as scheduled later this week. Follow up with Dr. Diona Browner after discharge. Swelling in legs is significant and some vascular congestion noted on CXR. Will check BNP, consider escalation of diuretic, but will start by restarting home lasix.  - Continue SGLT2i. Continue clonidine to avoid rebound effects. Holding losartan, imdur, coreg.  Chronic hypoxic respiratory failure: Followed by Dr. Sherene Sires, Dx NSIP. CXR nonacute.  - Continue baseline oxygen supplementation.   History of endometrial CA: Continue letrozole, give VTE ppx.   PCV: Hgb 15.6g/dl.  - Continue hydrea, cause of macrocytosis. F/u with CA center here Oct 25.   GERD:  - Continue PPI (formulary equivalent)   Advance Care Planning: Full  Consults: Neurology  Family Communication: Daughter at bedside  Severity of Illness: The appropriate patient status for this patient is INPATIENT. Inpatient status is judged to be reasonable and necessary in order to provide the required intensity of service to ensure the patient's safety. The patient's presenting symptoms, physical exam findings, and initial radiographic and laboratory data in the context of their chronic comorbidities is felt to place them at high risk for further clinical deterioration. Furthermore, it is not anticipated that the patient will be medically stable for discharge from the hospital within 2 midnights of admission.   * I certify that at the point of admission it is my clinical judgment that the patient will require inpatient hospital care spanning beyond 2 midnights from the point of  admission due to high intensity of service, high risk for further deterioration and high frequency of surveillance required.*  Author: Tyrone Nine, MD 08/06/2023 3:54 PM  For on call review [email or web address].

## 2023-08-06 NOTE — ED Provider Notes (Signed)
[place] EMERGENCY DEPARTMENT AT [facility] Provider Note   CSN: [id number] Arrival date & time: 08/06/23  7829     History  Chief Complaint  Patient presents with   Dizziness    [name] is a 82 y.o. female.  HPI Patient presents for weakness and dizziness.  Medical history includes arthritis, GERD, HTN, prior GI bleeding, remote breast cancer, polycythemia vera/essential thrombocytosis, endometrial cancer, gout, CHF.  She is followed by oncology.  She is on letrozole and Hydrea.  Other home medications include clonidine, Cardizem, Coreg, Lasix, Imdur, losartan, statin.  Starting yesterday, she began to experience dizziness.  This was worsened with standing and ambulation.  She does live alone.  She has family that lives close by.  This morning, she had worsened difficulty ambulating.  She called her daughter, who brought her to the ED.  Patient reports some lightheadedness since mild dizziness currently at rest.  She had an episode of nausea and vomiting this morning.  Her nausea has improved.  She denies any areas of pain.  She did have a cough yesterday.  She has not had any recent shortness of breath.    Home Medications Prior to Admission medications   Medication Sig Start Date End Date Taking? Authorizing Provider  acetaminophen (TYLENOL) 650 MG CR tablet Take 1,300 mg by mouth 2 (two) times daily.   Yes [provider]  aspirin EC 81 MG tablet Take 81 mg by mouth at bedtime.    Yes [provider]  carvedilol (COREG) 12.5 MG tablet Take 12.5 mg by mouth 2 (two) times daily with a meal. Take 1/2 tablet by 2 times daily   Yes [provider]  cloNIDine (CATAPRES) 0.3 MG tablet Take 0.15 mg by mouth 2 (two) times daily.   Yes [provider]  diltiazem (CARDIZEM CD) 180 MG 24 hr capsule Take 180 mg by mouth daily.  03/05/13  Yes [provider]  empagliflozin (JARDIANCE) 10 MG TABS tablet TAKE 1 TABLET BY MOUTH ONCE  DAILY BEFORE BREAKFAST 06/18/23  Yes Jonelle Sidle, MD  esomeprazole (NEXIUM) 40 MG capsule Take 1 capsule (40 mg total) by mouth every morning. 09/15/14  Yes Setzer, Brand Males, NP  famotidine (PEPCID) 20 MG tablet One after supper 06/14/23  Yes [name], MD  furosemide (LASIX) 40 MG tablet Take 1.5 tablets (60 mg total) by mouth daily. Patient taking differently: Take 40 mg by mouth daily. 04/06/22  Yes Jonelle Sidle, MD  hydroxyurea (HYDREA) 500 MG capsule Take 2 capsules (1,000 mg total) by mouth every Monday, Wednesday, and Friday AND 3 capsules (1,500 mg total) every Tuesday, Thursday, Saturday, and Sunday. You can split to 1500 mg dose into 2 tablets in the AM and 1 tablet PM.. 06/13/23  Yes Pennington, Rebekah M, PA-C  isosorbide mononitrate (IMDUR) 30 MG 24 hr tablet TAKE 1/2 (ONE-HALF) TABLET BY MOUTH AT BEDTIME Patient taking differently: 15 mg at bedtime. 07/17/23  Yes Jonelle Sidle, MD  letrozole [facility]) 2.5 MG tablet Take 1 tablet by mouth once daily 01/23/23  Yes Doreatha Massed, MD  losartan (COZAAR) 100 MG tablet Take 100 mg by mouth every evening.  02/25/13  Yes [provider]  Multiple Vitamins-Minerals (PRESERVISION AREDS PO) Take by mouth.   Yes [provider]  Simethicone (GAS RELIEF PO) Take 1 tablet by mouth daily.   Yes [provider]  simvastatin (ZOCOR) 10 MG tablet Take 10 mg by mouth every evening.  Yes [provider]  Vitamin D, Ergocalciferol, (DRISDOL) 1.25 MG (50000 UNIT) CAPS capsule Take 1 capsule (50,000 Units total) by mouth every 14 (fourteen) days. 11/28/22  Yes Pennington, Rebekah M, PA-C      Allergies    Carbamazepine and Sulfa antibiotics    Review of Systems   Review of Systems  Constitutional:  Positive for fatigue.  Gastrointestinal:  Positive for nausea and vomiting.  Neurological:  Positive for dizziness, weakness (Generalized) and light-headedness.  All other systems reviewed and are  negative.   Physical Exam Updated Vital Signs BP 139/89   Pulse 76   Temp 97.9 F (36.6 C) (Oral)   Resp (!) 21   Ht 5\' 4"  (1.626 m)   Wt 103.3 kg   SpO2 94%   BMI 39.08 kg/m  Physical Exam Vitals and nursing note reviewed.  Constitutional:      General: She is not in acute distress.    Appearance: Normal appearance. She is well-developed and normal weight. She is not ill-appearing, toxic-appearing or diaphoretic.  HENT:     Head: Normocephalic and atraumatic.     Right Ear: External ear normal.     Left Ear: External ear normal.     Nose: Nose normal.     Mouth/Throat:     Mouth: Mucous membranes are moist.  Eyes:     Extraocular Movements: Extraocular movements intact.     Conjunctiva/sclera: Conjunctivae normal.  Cardiovascular:     Rate and Rhythm: Normal rate and regular rhythm.  Pulmonary:     Effort: Pulmonary effort is normal. No respiratory distress.     Breath sounds: Normal breath sounds. No wheezing or rales.  Abdominal:     General: There is no distension.     Palpations: Abdomen is soft.     Tenderness: There is no abdominal tenderness.  Musculoskeletal:        General: No swelling. Normal range of motion.     Cervical back: Normal range of motion and neck supple.  Skin:    General: Skin is warm and dry.     Coloration: Skin is not jaundiced or pale.  Neurological:     General: No focal deficit present.     Mental Status: She is alert and oriented to person, place, and time.     Cranial Nerves: No cranial nerve deficit, dysarthria or facial asymmetry.     Sensory: Sensation is intact. No sensory deficit.     Motor: Motor function is intact. No weakness, abnormal muscle tone or pronator drift.     Coordination: Coordination is intact. Finger-Nose-Finger Test normal.  Psychiatric:        Mood and Affect: Mood normal.        Behavior: Behavior normal.     ED Results / Procedures / Treatments   Labs (all labs ordered are listed, but only abnormal  results are displayed) Labs Reviewed  URINALYSIS, ROUTINE W REFLEX MICROSCOPIC - Abnormal; Notable for the following components:      Result Value   Glucose, UA >=500 (*)    All other components within normal limits  BASIC METABOLIC PANEL - Abnormal; Notable for the following components:   Glucose, Bld 129 (*)    All other components within normal limits  CBC WITH DIFFERENTIAL/PLATELET - Abnormal; Notable for the following components:   RBC 3.84 (*)    Hemoglobin 15.6 (*)    MCV 118.0 (*)    MCH 40.6 (*)    All other components within  normal limits  CBG MONITORING, ED - Abnormal; Notable for the following components:   Glucose-Capillary 115 (*)    All other components within normal limits  HEPATIC FUNCTION PANEL  PROTIME-INR  MAGNESIUM  I-STAT CHEM 8, ED  TROPONIN I (HIGH SENSITIVITY)  TROPONIN I (HIGH SENSITIVITY)    EKG EKG Interpretation Date/Time:  Monday August 06 2023 09:30:18 EDT Ventricular Rate:  67 PR Interval:  180 QRS Duration:  166 QT Interval:  442 QTC Calculation: 467 R Axis:   -4  Text Interpretation: Sinus rhythm Probable left atrial enlargement Right bundle branch block Confirmed by Gloris Manchester ([id number]) on 08/06/2023 9:52:35 AM  Radiology CT ANGIO HEAD NECK W WO CM  Result Date: 08/06/2023 CLINICAL DATA:  Dizziness today EXAM: CT ANGIOGRAPHY HEAD AND NECK WITH AND WITHOUT CONTRAST TECHNIQUE: Multidetector CT imaging of the head and neck was performed using the standard protocol during bolus administration of intravenous contrast. Multiplanar CT image reconstructions and MIPs were obtained to evaluate the vascular anatomy. Carotid stenosis measurements (when applicable) are obtained utilizing NASCET criteria, using the distal internal carotid diameter as the denominator. RADIATION DOSE REDUCTION: This exam was performed according to the departmental dose-optimization program which includes automated exposure control, adjustment of the mA and/or kV according to  patient size and/or use of iterative reconstruction technique. CONTRAST:  75mL OMNIPAQUE IOHEXOL 350 MG/ML SOLN COMPARISON:  Same day brain MRI FINDINGS: CT HEAD FINDINGS Brain: The known punctate infarct in the right parietal lobe seen on same day brain MRI is not seen on the current study. There is no evidence of acute intracranial hemorrhage or acute territorial infarct. Parenchymal volume is normal. The ventricles are normal in size. Advanced background chronic small-vessel ischemic change is again seen. The pituitary and suprasellar region are normal. There is no mass lesion. There is no mass effect or midline shift. Vascular: See below. Skull: Normal. Negative for fracture or focal lesion. Sinuses/Orbits: The paranasal sinuses are clear. Bilateral lens implants are in place. The globes and orbits are otherwise unremarkable. Other: The mastoid air cells and middle ear cavities are clear. Review of the MIP images confirms the above findings CTA NECK FINDINGS Aortic arch: There is mild calcified plaque in the imaged aortic arch. The origins of the major branch vessels are patent. The subclavian arteries are patent to the level imaged. Right carotid system: The right common, internal, and external carotid arteries are patent, with mild plaque at the bifurcation but no significant stenosis or occlusion. There is no evidence of dissection or aneurysm. Left carotid system: The left common, internal, and external carotid arteries are patent, with mild plaque at the bifurcation but no hemodynamically significant stenosis or occlusion there is no evidence of dissection or aneurysm. Vertebral arteries: The vertebral arteries are patent, without hemodynamically significant stenosis or occlusion. There is no evidence of dissection or aneurysm. Skeleton: There is no acute osseous abnormality or suspicious osseous lesion. There is no visible canal hematoma. Other neck: The soft tissues of the neck are unremarkable. Upper  chest: There is subpleural reticulation in both lung apices with left worse than right apical scarring. Review of the MIP images confirms the above findings CTA HEAD FINDINGS Anterior circulation: There is mild plaque in the intracranial ICAs without significant stenosis or occlusion. The bilateral MCAs and ACAS are patent, without proximal stenosis or occlusion. The anterior communicating artery is not definitely seen. There is no aneurysm or AVM. Posterior circulation: The bilateral V4 segments are patent. The basilar artery is patent.  The major cerebellar arteries appear patent. The bilateral PCAs are patent, without proximal stenosis or occlusion. Posterior communicating arteries are not definitely seen. There is no aneurysm or AVM. Venous sinuses: Patent. Anatomic variants: None. Review of the MIP images confirms the above findings IMPRESSION: Patent vasculature of the head and neck with mild calcified plaque at the carotid bifurcations and intracranial ICAs but no hemodynamically significant stenosis, occlusion, or dissection. Electronically Signed   By: Lesia Hausen M.D.   On: 08/06/2023 14:52   MR BRAIN WO CONTRAST  Result Date: 08/06/2023 CLINICAL DATA:  Neuro deficit, acute, stroke suspected EXAM: MRI HEAD WITHOUT CONTRAST TECHNIQUE: Multiplanar, multiecho pulse sequences of the brain and surrounding structures were obtained without intravenous contrast. COMPARISON:  None Available. FINDINGS: Brain: Punctate acute infarct in the right parietal lobe (series 6, image 29). No hemorrhage. No hydrocephalus. No extra-axial fluid collection. No mass effect. No mass lesion. There are scattered microhemorrhages predominantly lobar location, for example in the left temporal lobe, and bilateral parietal lobes. Sequela of severe chronic microvascular ischemic change. Vascular: Normal flow voids. Skull and upper cervical spine: Degenerative changes in the upper cervical spine with grade 1 anterolisthesis of C2 on  C3 and C3 on C4. There is likely at least moderate spinal canal narrowing C4-C5. Sinuses/Orbits: No middle ear or mastoid effusion. Polypoid mucosal thickening right maxillary sinus. Bilateral lens replacement. Other: None. IMPRESSION: 1. Punctate acute infarct in the right parietal lobe. No hemorrhage. 2. Sequela of severe chronic microvascular ischemic change. 3. Scattered microhemorrhages predominantly lobar location, which can be seen in the setting of cerebral amyloid angiopathy. 4. Degenerative changes in the upper cervical spine with likely at least moderate spinal canal narrowing C4-C5. Electronically Signed   By: Lorenza Cambridge M.D.   On: 08/06/2023 12:56   DG Chest Portable 1 View  Result Date: 08/06/2023 CLINICAL DATA:  dizzy.  Nausea/vomiting. EXAM: PORTABLE CHEST 1 VIEW COMPARISON:  04/25/2021. FINDINGS: Low lung volume. Mild diffuse vascular congestion, likely accentuated by low lung volume. No overt pulmonary edema. Bilateral lung fields are otherwise clear. No acute consolidation or major lung collapse. Note is again made of elevated right hemidiaphragm. Bilateral costophrenic angles are clear. Stable cardio-mediastinal silhouette. Aortic arch calcifications noted. No acute osseous abnormalities. The soft tissues are within normal limits. IMPRESSION: 1. Low lung volume. Mild diffuse vascular congestion. No overt pulmonary edema. Electronically Signed   By: Jules Schick M.D.   On: 08/06/2023 11:06    Procedures Procedures    Medications Ordered in ED Medications  lactated ringers bolus 500 mL (0 mLs Intravenous Stopped 08/06/23 1045)  meclizine (ANTIVERT) tablet 25 mg (25 mg Oral Given 08/06/23 0952)  diazepam (VALIUM) injection 2.5 mg (2.5 mg Intravenous Given 08/06/23 1040)  iohexol (OMNIPAQUE) 350 MG/ML injection 75 mL (75 mLs Intravenous Contrast Given 08/06/23 1130)  aspirin tablet 325 mg (325 mg Oral Given 08/06/23 1445)  hydroxyurea (HYDREA) capsule 1,000 mg (1,000 mg Oral Given  08/06/23 1445)    ED Course/ Medical Decision Making/ A&P                                 Medical Decision Making Amount and/or Complexity of Data Reviewed Labs: ordered. Radiology: ordered.  Risk OTC drugs. Prescription drug management. Decision regarding hospitalization.   This patient presents to the ED for concern of dizziness, this involves an extensive number of treatment options, and is a complaint that carries with it  a high risk of complications and morbidity.  The differential diagnosis includes CVA, TIA, vestibular neuritis, Mnire's disease, dehydration, polypharmacy, anemia, metabolic derangements   Co morbidities that complicate the patient evaluation  arthritis, GERD, HTN, prior GI bleeding, remote breast cancer, polycythemia vera/essential thrombocytosis, endometrial cancer, gout, CHF   Additional history obtained:  Additional history obtained from patient's daughter External records from outside source obtained and reviewed including EMR   Lab Tests:  I Ordered, and personally interpreted labs.  The pertinent results include: Baseline erythrocytosis, no leukocytosis, normal troponin, normal electrolytes   Imaging Studies ordered:  I ordered imaging studies including chest x-ray, MRI brain, CTA head and neck I independently visualized and interpreted imaging which showed punctate acute infarct in right parietal lobe identified on MRI. I agree with the radiologist interpretation   Cardiac Monitoring: / EKG:  The patient was maintained on a cardiac monitor.  I personally viewed and interpreted the cardiac monitored which showed an underlying rhythm of: Sinus rhythm   Consultations Obtained:  I requested consultation with the neurologist, Dr. Otelia Limes,  and discussed lab and imaging findings as well as pertinent plan - they recommend: Admission to [facility], aspirin.  Will hold off on dual antiplatelet for now.   Problem List / ED Course / Critical  interventions / Medication management  Patient presenting for worsening dizziness and generalized weakness since yesterday.  Vital signs on arrival notable for hypertension.  Currently, she is on her baseline 2 L of supplemental oxygen.  SpO2 is in the low 90s.  She is alert and oriented.  She has no focal neurologic deficits on exam.  She does endorse ongoing lightheadedness and dizziness.  This is worsened when she sits up in the bed.  Patient does have a relatively recent diagnosis of polycythemia vera over the past couple years.  She is on hydroxyurea.  She has not yet started intermittent phlebotomy, but this has been discussed.  She is at increased risk of thrombosis.  Broad workup was initiated.  On MRI, patient was found to have a punctate acute infarct in right parietal lobe.  I discussed this with neurologist on-call, Dr. Otelia Limes, who agrees with admission here at [facility] for further stroke workup.  He advised single antiplatelet therapy for now.  Dose of aspirin was ordered.  CTA of head and neck were ordered.  Results did not show any acute findings or significant stenoses.  Patient was admitted for further management. I ordered medication including meclizine for dizziness; Valium for anxiolysis during MRI; IV fluids for hydration; ASA for CVA Reevaluation of the patient after these medicines showed that the patient improved I have reviewed the patients home medicines and have made adjustments as needed   Social Determinants of Health:  Lives independently         Final Clinical Impression(s) / ED Diagnoses Final diagnoses:  Dizziness  Cerebrovascular accident (CVA), unspecified mechanism (HCC)    Rx / DC Orders ED Discharge Orders     None         Gloris Manchester, MD 08/06/23 1517

## 2023-08-06 NOTE — ED Triage Notes (Signed)
Pt BIB family for dizziness and nausea starting around 0345 this morning. Pt states trying to ambulate with her walker and feeling like she was going to fall over. Family gave pt a zofran to try and help with the nausea with no relief.

## 2023-08-07 ENCOUNTER — Telehealth: Payer: Self-pay | Admitting: *Deleted

## 2023-08-07 ENCOUNTER — Inpatient Hospital Stay (HOSPITAL_BASED_OUTPATIENT_CLINIC_OR_DEPARTMENT_OTHER): Payer: Medicare Other

## 2023-08-07 DIAGNOSIS — J984 Other disorders of lung: Secondary | ICD-10-CM | POA: Diagnosis not present

## 2023-08-07 DIAGNOSIS — R42 Dizziness and giddiness: Secondary | ICD-10-CM | POA: Diagnosis not present

## 2023-08-07 DIAGNOSIS — I639 Cerebral infarction, unspecified: Secondary | ICD-10-CM

## 2023-08-07 DIAGNOSIS — I6389 Other cerebral infarction: Secondary | ICD-10-CM

## 2023-08-07 DIAGNOSIS — J9611 Chronic respiratory failure with hypoxia: Secondary | ICD-10-CM | POA: Diagnosis not present

## 2023-08-07 DIAGNOSIS — I10 Essential (primary) hypertension: Secondary | ICD-10-CM | POA: Diagnosis not present

## 2023-08-07 LAB — LIPID PANEL
Cholesterol: 137 mg/dL (ref 0–200)
HDL: 46 mg/dL (ref >40)
LDL Cholesterol: 74 mg/dL (ref 0–99)
Total CHOL/HDL Ratio: 3 {ratio}
Triglycerides: 83 mg/dL (ref <150)
VLDL: 17 mg/dL (ref 0–40)

## 2023-08-07 LAB — HEMOGLOBIN A1C
Hgb A1c MFr Bld: 5 % (ref 4.8–5.6)
Mean Plasma Glucose: 96.8 mg/dL

## 2023-08-07 LAB — ECHOCARDIOGRAM COMPLETE
AR max vel: 3.9 cm2
AV Area VTI: 3.84 cm2
AV Area mean vel: 3.63 cm2
AV Mean grad: 12.5 mm[Hg]
AV Peak grad: 21.3 mm[Hg]
Ao pk vel: 2.31 m/s
Area-P 1/2: 2.66 cm2
Calc EF: 82.1 %
Est EF: >75
Height: 64 in
MV VTI: 4.55 cm2
S' Lateral: 1.5 cm
Single Plane A2C EF: 67.5 %
Single Plane A4C EF: 88.6 %
Weight: 3643.2 [oz_av]

## 2023-08-07 LAB — GLUCOSE, CAPILLARY
Glucose-Capillary: 107 mg/dL — ABNORMAL HIGH (ref 70–99)
Glucose-Capillary: 111 mg/dL — ABNORMAL HIGH (ref 70–99)

## 2023-08-07 LAB — BRAIN NATRIURETIC PEPTIDE: B Natriuretic Peptide: 117 pg/mL — ABNORMAL HIGH (ref 0.0–100.0)

## 2023-08-07 MED ORDER — ATORVASTATIN CALCIUM 40 MG PO TABS: 40.0000 mg | ORAL_TABLET | Freq: Every day | ORAL | 2 refills | Status: AC

## 2023-08-07 MED ORDER — FUROSEMIDE 40 MG PO TABS: 40.0000 mg | ORAL_TABLET | Freq: Every day | ORAL | Status: AC

## 2023-08-07 MED ORDER — CARVEDILOL 12.5 MG PO TABS: 6.2500 mg | ORAL_TABLET | Freq: Two times a day (BID) | ORAL | Status: AC

## 2023-08-07 MED ORDER — ATORVASTATIN CALCIUM 40 MG PO TABS
40.0000 mg | ORAL_TABLET | Freq: Every day | ORAL | Status: DC
  Administered 2023-08-07: 40 mg via ORAL
  Filled 2023-08-07: qty 1

## 2023-08-07 MED ORDER — ASPIRIN 81 MG PO CHEW
81.0000 mg | CHEWABLE_TABLET | Freq: Every day | ORAL | Status: DC
  Administered 2023-08-07: 81 mg via ORAL
  Filled 2023-08-07: qty 1

## 2023-08-07 NOTE — Hospital Course (Signed)
82 y.o. female with medical history significant of chronic hypoxic respiratory failure, PCV, TIA, endometrial CA s/p hysterectomy 2012, GERD w/hiatal hernia, HTN, and chronic HFpEF who presented to the ED with abrupt onset of dizziness, gait instability, and nausea this morning. She lives alone, usually gets around with a walker without issues, was in her usual state of health last night when she went to sleep. When getting up this morning at ~3:45am she felt dizzy, feeling like she was falling backward with walker. Had nausea to the point that she couldn't take her medications. She called her daughter who tried zofran without improvement, so she came in. Had an episode of vomiting. She has had no other focal weakness, numbness. Daughter says she has no change to her facial features and no dysarthria. Still works, lives alone. Work up in ED revealed acute right parietal infarct on MRI for which neurology was consulted and recommended admission. Neurology is formally consulted.

## 2023-08-07 NOTE — Consult Note (Addendum)
I connected with  [name] on 08/07/23 by a video enabled telemedicine application and verified that I am speaking with the correct person using two identifiers.   I discussed the limitations of evaluation and management by telemedicine. The patient expressed understanding and agreed to proceed.  Location of patient: [facility] Location of physician: [facility]   Neurology Consultation Reason for Consult: stroke Referring Physician: Dr. Gloris Manchester  CC: Nausea, dizziness  History is obtained from: Patient, chart review  HPI: [name] is a 82 y.o. female with past medical history of hypertension, TIA, polycythemia vera who presented with dizziness and nausea.  Patient states she woke up yesterday morning around 4 AM and felt dizzy described as fullness in both ears, slight spinning sensation.  This was associated with nausea and 1 episode of vomiting.  Does report cough but denies any other recent illness.  She eventually came to the ER after 9:00.  MRI brain was done which showed incidental right parietal stroke.  Therefore neurology was consulted for further recommendations.  Patient states her symptoms are significantly improved.  States she has had similar symptoms in the past but usually she can put a cold rag on her face and symptoms improved.  Last known normal: 08/05/2023 No tPA as outside window No thrombectomy as no large vessel occlusion Event happened at home mRS 0   ROS: All other systems reviewed and negative except as noted in the HPI.   Past Medical History:  Diagnosis Date   Arthritis    Chronic diastolic heart failure (HCC)    Essential hypertension    GERD (gastroesophageal reflux disease)    Gout    Hiatal hernia    History of endometrial cancer    Sp hysterectomy 2012   Ischemic colitis (HCC) 2016   Myeloproliferative disorder, JAK-2 positive    Obesity    Pneumonia    Polycythemia vera(238.4)    TIA (transient ischemic  attack)     Family History  Problem Relation Age of Onset   Hypertension Mother    Lung cancer Father    Arthritis/Rheumatoid Sister    Anesthesia problems Neg Hx    Hypotension Neg Hx    Malignant hyperthermia Neg Hx    Pseudochol deficiency Neg Hx     Social History:  reports that she has never smoked. She has never been exposed to tobacco smoke. She has never used smokeless tobacco. She reports that she does not drink alcohol and does not use drugs.   Medications Prior to Admission  Medication Sig Dispense Refill Last Dose   acetaminophen (TYLENOL) 650 MG CR tablet Take 1,300 mg by mouth 2 (two) times daily.   08/05/2023   aspirin EC 81 MG tablet Take 81 mg by mouth at bedtime.    08/05/2023   carvedilol (COREG) 12.5 MG tablet Take 12.5 mg by mouth 2 (two) times daily with a meal. Take 1/2 tablet by 2 times daily   08/05/2023   cloNIDine (CATAPRES) 0.3 MG tablet Take 0.15 mg by mouth 2 (two) times daily.   08/05/2023   diltiazem (CARDIZEM CD) 180 MG 24 hr capsule Take 180 mg by mouth daily.    08/05/2023   empagliflozin (JARDIANCE) 10 MG TABS tablet TAKE 1 TABLET BY MOUTH ONCE DAILY BEFORE BREAKFAST 30 tablet 6 08/05/2023   esomeprazole (NEXIUM) 40 MG capsule Take 1 capsule (40 mg total) by mouth every morning. 30 capsule 11 08/05/2023   famotidine (PEPCID) 20 MG  tablet One after supper 30 tablet 11 08/05/2023   furosemide (LASIX) 40 MG tablet Take 1.5 tablets (60 mg total) by mouth daily. (Patient taking differently: Take 40 mg by mouth daily.) 135 tablet 3 08/05/2023   hydroxyurea (HYDREA) 500 MG capsule Take 2 capsules (1,000 mg total) by mouth every Monday, Wednesday, and Friday AND 3 capsules (1,500 mg total) every Tuesday, Thursday, Saturday, and Sunday. You can split to 1500 mg dose into 2 tablets in the AM and 1 tablet PM.. 80 capsule 3 08/05/2023   isosorbide mononitrate (IMDUR) 30 MG 24 hr tablet TAKE 1/2 (ONE-HALF) TABLET BY MOUTH AT BEDTIME (Patient taking differently: 15 mg at  bedtime.) 45 tablet 1 08/05/2023   letrozole (FEMARA) 2.5 MG tablet Take 1 tablet by mouth once daily 90 tablet 0 08/05/2023   losartan (COZAAR) 100 MG tablet Take 100 mg by mouth every evening.    08/05/2023   Multiple Vitamins-Minerals (PRESERVISION AREDS PO) Take by mouth.   08/05/2023   Simethicone (GAS RELIEF PO) Take 1 tablet by mouth daily.   08/05/2023   simvastatin (ZOCOR) 10 MG tablet Take 10 mg by mouth every evening.    08/05/2023   Vitamin D, Ergocalciferol, (DRISDOL) 1.25 MG (50000 UNIT) CAPS capsule Take 1 capsule (50,000 Units total) by mouth every 14 (fourteen) days. 6 capsule 3 Past Week      Exam: Current vital signs: BP (!) 155/72 (BP Location: Right Arm)   Pulse 83   Temp 98.3 F (36.8 C) (Oral)   Resp 19   Ht 5\' 4"  (1.626 m)   Wt 103.3 kg   SpO2 98%   BMI 39.08 kg/m  Vital signs in last 24 hours: Temp:  [97.9 F (36.6 C)-98.6 F (37 C)] 98.3 F (36.8 C) (10/01 0741) Pulse Rate:  [66-87] 83 (10/01 0741) Resp:  [18-23] 19 (10/01 0741) BP: (137-178)/(66-93) 155/72 (10/01 0741) SpO2:  [89 %-98 %] 98 % (10/01 0741) FiO2 (%):  [2 %] 2 % (10/01 0741) Weight:  [103.3 kg] 103.3 kg (09/30 0934)   Physical Exam  Constitutional: Appears well-developed and well-nourished.  Psych: Affect appropriate to situation Neuro: AOx3, no aphasia, cranial nerves II to XII grossly intact, antigravity strength without drift in all 4 extremities, sensation intact to light touch, FTN intact bilaterally  NIHSS 0   I have reviewed labs in epic and the results pertinent to this consultation are: CBC:  Recent Labs  Lab 08/06/23 0939  WBC 4.7  NEUTROABS 3.6  HGB 15.6*  HCT 45.3  MCV 118.0*  PLT 211    Basic Metabolic Panel:  Lab Results  Component Value Date   NA 140 08/06/2023   K 3.5 08/06/2023   CO2 26 08/06/2023   GLUCOSE 129 (H) 08/06/2023   BUN 15 08/06/2023   CREATININE 0.44 08/06/2023   CALCIUM 9.2 08/06/2023   GFRNONAA >60 08/06/2023   GFRAA >60 11/03/2019    Lipid Panel:  Lab Results  Component Value Date   LDLCALC 74 08/07/2023   HgbA1c:  Lab Results  Component Value Date   HGBA1C 5.0 08/06/2023   Urine Drug Screen: No results found for: "LABOPIA", "COCAINSCRNUR", "LABBENZ", "AMPHETMU", "THCU", "LABBARB"  Alcohol Level No results found for: "ETH"   I have reviewed the images obtained:  CT angio Head and Neck with contrast 08/06/2023: Patent vasculature of the head and neck with mild calcified plaque at the carotid bifurcations and intracranial ICAs but no hemodynamically significant stenosis, occlusion, or dissection.  MRI Brain without contrast  08/06/2023: Punctate acute infarct in the right parietal lobe. No hemorrhage. Sequela of severe chronic microvascular ischemic change. Scattered microhemorrhages predominantly lobar location, which can be seen in the setting of cerebral amyloid angiopathy.    ASSESSMENT/PLAN: 82 year old female presented with dizziness, nausea which has since improved.  Acute ischemic stroke, incidental -Likely small vessel disease   Recommendations: -Continue aspirin 80 mg daily -Switch simvastatin to atorvastatin 40 mg daily for goal LDL less than 70 (currently 74) -TTE: Left regular ejection fraction 75%, no regional wall motion abnormalities, impaired relaxation, left atrium was moderately dilated. -30-day cardiac monitor to look for paroxysmal A-fib -Goal blood pressure: Normotension -PT/OT -Discussed plan with patient as well as Dr. Laural Benes -Follow-up with neurology in 3 months, order placed   Thank you for allowing [place] to participate in the care of this patient. If you have any further questions, please contact  me or neurohospitalist.   Lindie Spruce [facility] neurohospitalist

## 2023-08-07 NOTE — Plan of Care (Signed)
  Problem: Acute Rehab PT Goals(only PT should resolve) Goal: Pt Will Go Supine/Side To Sit Outcome: Progressing Flowsheets (Taken 08/07/2023 1224) Pt will go Supine/Side to Sit:  with modified independence  Independently Goal: Patient Will Transfer Sit To/From Stand Outcome: Progressing Flowsheets (Taken 08/07/2023 1224) Patient will transfer sit to/from stand:  with modified independence  with supervision Goal: Pt Will Transfer Bed To Chair/Chair To Bed Outcome: Progressing Flowsheets (Taken 08/07/2023 1224) Pt will Transfer Bed to Chair/Chair to Bed:  with modified independence  with supervision Goal: Pt Will Ambulate Outcome: Progressing Flowsheets (Taken 08/07/2023 1224) Pt will Ambulate:  75 feet  with modified independence  with supervision  with rolling walker   12:25 PM, 08/07/23 [name], MPT Physical Therapist with [facility] [phone] office [phone] mobile phone

## 2023-08-07 NOTE — Telephone Encounter (Signed)
Dr. Laural Benes requested that wear a 30 day event monitor for CVA. Pt enrolled in [employer] and order placed.

## 2023-08-07 NOTE — Care Management Obs Status (Signed)
MEDICARE OBSERVATION STATUS NOTIFICATION   Patient Details  Name: LAYCI STENGLEIN MRN: [id number] Date of Birth: July 18, 1941   Medicare Observation Status Notification Given:  Yes    Leitha Bleak, RN 08/07/2023, 12:02 PM

## 2023-08-07 NOTE — Care Management CC44 (Signed)
Condition Code 44 Documentation Completed  Patient Details  Name: [name] MRN: [id number] Date of Birth: [birth year]   Condition Code 44 given:  Yes Patient signature on Condition Code 44 notice:  Yes Documentation of 2 MD's agreement:  Yes Code 44 added to claim:  Yes    Leitha Bleak, RN 08/07/2023, 12:02 PM

## 2023-08-07 NOTE — Evaluation (Signed)
Physical Therapy Evaluation Patient Details Name: [name] MRN: [id number] DOB: 15-Oct-1941 Today's Date: 08/07/2023  History of Present Illness  [name] is an 82 y.o. female with medical history significant of chronic hypoxic respiratory failure, PCV, TIA, endometrial CA s/p hysterectomy 2012, GERD w/hiatal hernia, HTN, and chronic HFpEF who presented to the ED with abrupt onset of dizziness, gait instability, and nausea this morning. She lives alone, usually gets around with a walker without issues, was in her usual state of health last night when she went to sleep. When getting up this morning at ~3:45am she felt dizzy, feeling like she was falling backward with walker. Had nausea to the point that she couldn't take her medications. She called her daughter who tried zofran without improvement, so she came in. Had an episode of vomiting. She has had no other focal weakness, numbness. Daughter says she has no change to her facial features and no dysarthria. Still works, lives alone. Work up in ED revealed acute right parietal infarct on MRI for which neurology was consulted and recommended admission. Neurology is formally consulted.   Clinical Impression  Patient functioning near/at baseline for functional mobility and gait with good return for transferring to/from commode in bathroom, desaturated below 90% on room air and required 2 LPM O2 during activities, able to ambulate in room/hallway without loss of balance, but limited mostly due to fatigue.  Patient tolerated sitting up in chair after therapy with her son present.  Patient will benefit from continued skilled physical therapy in hospital and recommended venue below to increase strength, balance, endurance for safe ADLs and gait.           If plan is discharge home, recommend the following: A little help with walking and/or transfers;A little help with bathing/dressing/bathroom;Help with stairs or ramp for entrance;Assistance  with cooking/housework   Can travel by private vehicle        Equipment Recommendations None recommended by PT  Recommendations for Other Services       Functional Status Assessment Patient has had a recent decline in their functional status and demonstrates the ability to make significant improvements in function in a reasonable and predictable amount of time.     Precautions / Restrictions Precautions Precautions: Fall Restrictions Weight Bearing Restrictions: No      Mobility  Bed Mobility Overal bed mobility: Modified Independent             General bed mobility comments: HOB elevated    Transfers Overall transfer level: Needs assistance   Transfers: Sit to/from Stand, Bed to chair/wheelchair/BSC Sit to Stand: Modified independent (Device/Increase time), Supervision   Step pivot transfers: Modified independent (Device/Increase time), Supervision       General transfer comment: good return for transferring to/from commode in bathrom and chair at bedside    Ambulation/Gait Ambulation/Gait assistance: Supervision, Contact guard assist Gait Distance (Feet): 45 Feet Assistive device: Rolling walker (2 wheels) Gait Pattern/deviations: Decreased step length - right, Decreased step length - left, Decreased stride length Gait velocity: decreased     General Gait Details: sllightly labored cadence without loss of balance, on 2 LPM O2 with SpO2 at 92%, limited mostly due to fatiue  Stairs            Wheelchair Mobility     Tilt Bed    Modified Rankin (Stroke Patients Only)       Balance Overall balance assessment: Needs assistance Sitting-balance support: Feet supported, No upper extremity supported   Sitting  balance - Comments: seated at EOB   Standing balance support: During functional activity, Bilateral upper extremity supported, Reliant on assistive device for balance Standing balance-Leahy Scale: Fair Standing balance comment: using RW                              Pertinent Vitals/Pain Pain Assessment Pain Assessment: No/denies pain    Home Living Family/patient expects to be discharged to:: Private residence Living Arrangements: Alone Available Help at Discharge: Family;Available 24 hours/day Type of Home: House Home Access: Level entry       Home Layout: One level Home Equipment: Agricultural consultant (2 wheels);Rollator (4 wheels);BSC/3in1;Shower seat - built in;Grab bars - tub/shower;Electric scooter;Lift chair Additional Comments: Much of family lives beside the pt.    Prior Function Prior Level of Function : Independent/Modified Independent             Mobility Comments: Works; RW ambulation with PRN use of O2 during the day. 2LPM at night. Drives. ADLs Comments: Independent     Extremity/Trunk Assessment   Upper Extremity Assessment Upper Extremity Assessment: Defer to OT evaluation    Lower Extremity Assessment Lower Extremity Assessment: Generalized weakness    Cervical / Trunk Assessment Cervical / Trunk Assessment: Normal  Communication   Communication Communication: No apparent difficulties  Cognition Arousal: Alert Behavior During Therapy: WFL for tasks assessed/performed Overall Cognitive Status: Within Functional Limits for tasks assessed                                          General Comments      Exercises     Assessment/Plan    PT Assessment Patient needs continued PT services  PT Problem List Decreased strength;Decreased activity tolerance;Decreased balance;Decreased mobility       PT Treatment Interventions DME instruction;Gait training;Stair training;Functional mobility training;Therapeutic activities;Therapeutic exercise;Balance training;Neuromuscular re-education;Patient/family education    PT Goals (Current goals can be found in the Care Plan section)  Acute Rehab PT Goals Patient Stated Goal: return home with family to assist PT Goal  Formulation: With patient/family Time For Goal Achievement: 08/10/23 Potential to Achieve Goals: Good    Frequency Min 4X/week     Co-evaluation PT/OT/SLP Co-Evaluation/Treatment: Yes Reason for Co-Treatment: To address functional/ADL transfers PT goals addressed during session: Mobility/safety with mobility;Balance;Proper use of DME OT goals addressed during session: ADL's and self-care       AM-PAC PT "6 Clicks" Mobility  Outcome Measure Help needed turning from your back to your side while in a flat bed without using bedrails?: None Help needed moving from lying on your back to sitting on the side of a flat bed without using bedrails?: A Little Help needed moving to and from a bed to a chair (including a wheelchair)?: A Little Help needed standing up from a chair using your arms (e.g., wheelchair or bedside chair)?: A Little Help needed to walk in hospital room?: A Little Help needed climbing 3-5 steps with a railing? : A Little 6 Click Score: 19    End of Session Equipment Utilized During Treatment: Oxygen Activity Tolerance: Patient tolerated treatment well;Patient limited by fatigue Patient left: in chair;with call bell/phone within reach;with family/visitor present Nurse Communication: Mobility status PT Visit Diagnosis: Unsteadiness on feet (R26.81);Other abnormalities of gait and mobility (R26.89);Muscle weakness (generalized) (M62.81)    Time: 9528-4132 PT Time Calculation (min) (  ACUTE ONLY): 28 min   Charges:   PT Evaluation $PT Eval Moderate Complexity: 1 Mod PT Treatments $Therapeutic Activity: 23-37 mins PT General Charges $$ ACUTE PT VISIT: 1 Visit         12:23 PM, 08/07/23 Ocie Bob, MPT Physical Therapist with [facility] 336 [phone] office [phone] mobile phone

## 2023-08-07 NOTE — Evaluation (Signed)
Occupational Therapy Evaluation Patient Details Name: [name] MRN: [id number] DOB: [birth year] Today's Date: 08/07/2023   History of Present Illness [name] is an 82 y.o. female with medical history significant of chronic hypoxic respiratory failure, PCV, TIA, endometrial CA s/p hysterectomy 2012, GERD w/hiatal hernia, HTN, and chronic HFpEF who presented to the ED with abrupt onset of dizziness, gait instability, and nausea this morning. She lives alone, usually gets around with a walker without issues, was in her usual state of health last night when she went to sleep. When getting up this morning at ~3:45am she felt dizzy, feeling like she was falling backward with walker. Had nausea to the point that she couldn't take her medications. She called her daughter who tried zofran without improvement, so she came in. Had an episode of vomiting. She has had no other focal weakness, numbness. Daughter says she has no change to her facial features and no dysarthria. Still works, lives alone. Work up in ED revealed acute right parietal infarct on MRI for which neurology was consulted and recommended admission. Neurology is formally consulted. (per MD)   Clinical Impression   Pt agreeable to OT and PT co-evaluation. Pt appears to be near baseline function other than noted desaturation while ambulating even while on 2 LPM supplemental O2. Pt noted to sat around 80% as a low while ambulating, but was able to return to 90s when seated. Pt was left in the chair with family present. Pt is not recommended for further acute OT services and will be discharged to care of nursing staff for remaining length of stay.              Functional Status Assessment  Patient has not had a recent decline in their functional status  Equipment Recommendations  None recommended by OT           Precautions / Restrictions Precautions Precautions: Fall Restrictions Weight Bearing Restrictions: No       Mobility Bed Mobility Overal bed mobility: Modified Independent             General bed mobility comments: HOB elevated    Transfers Overall transfer level: Needs assistance   Transfers: Sit to/from Stand, Bed to chair/wheelchair/BSC Sit to Stand: Modified independent (Device/Increase time), Supervision     Step pivot transfers: Modified independent (Device/Increase time), Supervision     General transfer comment: Limited by desaturation at times.      Balance Overall balance assessment: Needs assistance Sitting-balance support: No upper extremity supported, Feet supported Sitting balance-Leahy Scale: Good Sitting balance - Comments: seated at EOB   Standing balance support: During functional activity, Bilateral upper extremity supported, Reliant on assistive device for balance Standing balance-Leahy Scale: Fair Standing balance comment: using RW                           ADL either performed or assessed with clinical judgement   ADL Overall ADL's : Needs assistance/impaired     Grooming: Modified independent;Supervision/safety;Standing               Lower Body Dressing: Set up;Modified independent;Sitting/lateral leans Lower Body Dressing Details (indicate cue type and reason): Pt does not wear socks at baseline. Slips on shoes. Likely able to complete other lower body dressing without assist. Toilet Transfer: Supervision/safety;Modified Independent;Rolling walker (2 wheels);Ambulation   Toileting- Clothing Manipulation and Hygiene: Modified independent;Sitting/lateral lean       Functional mobility during ADLs: Modified independent;Supervision/safety  General ADL Comments: Limited by desaturation/endurance.     Vision Baseline Vision/History: 1 Wears glasses Ability to See in Adequate Light: 1 Impaired Patient Visual Report: No change from baseline Vision Assessment?: No apparent visual deficits;Wears glasses for reading     Perception  Perception: Not tested       Praxis Praxis: [facility]       Pertinent Vitals/Pain Pain Assessment Pain Assessment: No/denies pain     Extremity/Trunk Assessment Upper Extremity Assessment Upper Extremity Assessment: Generalized weakness (LImited bilateral shoulder flexion to ~75% of available range by rotator cuff issues on L side and arthrtis overall. WFL other than this.)   Lower Extremity Assessment Lower Extremity Assessment: Defer to PT evaluation   Cervical / Trunk Assessment Cervical / Trunk Assessment: Normal   Communication Communication Communication: No apparent difficulties   Cognition Arousal: Alert Behavior During Therapy: WFL for tasks assessed/performed Overall Cognitive Status: Within Functional Limits for tasks assessed                                                        Home Living Family/patient expects to be discharged to:: Private residence Living Arrangements: Alone Available Help at Discharge: Family;Available 24 hours/day Type of Home: House Home Access: Level entry     Home Layout: One level     Bathroom Shower/Tub: Other (comment) ("walk-in tub")   Bathroom Toilet: Handicapped height Bathroom Accessibility: No   Home Equipment: Agricultural consultant (2 wheels);Rollator (4 wheels);BSC/3in1;Shower seat - built in;Grab bars - tub/shower;Electric scooter;Lift chair   Additional Comments: Much of family lives beside the pt.      Prior Functioning/Environment Prior Level of Function : Independent/Modified Independent             Mobility Comments: Works; RW ambulation with PRN use of O2 during the day. 2LPM at night. Drives. ADLs Comments: Independent                                Co-evaluation PT/OT/SLP Co-Evaluation/Treatment: Yes Reason for Co-Treatment: To address functional/ADL transfers   OT goals addressed during session: ADL's and self-care                       End of Session  Equipment Utilized During Treatment: Rolling walker (2 wheels);Oxygen  Activity Tolerance: Patient tolerated treatment well Patient left: in chair;with call bell/phone within reach;with family/visitor present  OT Visit Diagnosis: Unsteadiness on feet (R26.81);Other abnormalities of gait and mobility (R26.89);Other symptoms and signs involving the nervous system (R29.898)                Time: 1610-9604 OT Time Calculation (min): 27 min Charges:  OT General Charges $OT Visit: 1 Visit OT Evaluation $OT Eval Low Complexity: 1 Low  Alter Moss OT, MOT  Danie Chandler 08/07/2023, 9:48 AM

## 2023-08-07 NOTE — Discharge Summary (Addendum)
Physician Discharge Summary  [name] GNF:[id number] DOB: [birth year] DOA: 08/06/2023  PCP: Carylon Perches, MD  Admit date: 08/06/2023 Discharge date: 08/07/2023  Admitted From:  HOME  Disposition: HOME   Recommendations for Outpatient Follow-up:  Follow up with PCP in 1 weeks Follow up with [facility] in 3 months  30 day cardiac monitor to be mailed to home from cardio office Please check lipid panel in 3 months  Discharge Condition: STABLE   CODE STATUS: FULL  DIET: heart healthy low sodium and cholesterol   Brief Hospitalization Summary: Please see all hospital notes, images, labs for full details of the hospitalization. Admission Provider HPI:   82 y.o. female with medical history significant of chronic hypoxic respiratory failure, PCV, TIA, endometrial CA s/p hysterectomy 2012, GERD w/hiatal hernia, HTN, and chronic HFpEF who presented to the ED with abrupt onset of dizziness, gait instability, and nausea this morning. She lives alone, usually gets around with a walker without issues, was in her usual state of health last night when she went to sleep. When getting up this morning at ~3:45am she felt dizzy, feeling like she was falling backward with walker. Had nausea to the point that she couldn't take her medications. She called her daughter who tried zofran without improvement, so she came in. Had an episode of vomiting. She has had no other focal weakness, numbness. Daughter says she has no change to her facial features and no dysarthria. Still works, lives alone. Work up in ED revealed acute right parietal infarct on MRI for which neurology was consulted and recommended admission. Neurology is formally consulted.   Hospital Course Pt was admitted with dizziness and concern for acute CVA.  Her MRI did show a tiny punctate right parietal stroke thought to be incidental finding by neurologist.  She was seen by neurology for formal consultation and her dizziness has resolved now.   Neurology recommends that she continue aspirin 81 mg daily, changed simvastatin to atorvastatin 40 mg daily due to suboptimal LDL level and recommended 30 day cardiac monitor.  I have sent message to L. Pinnix at [facility] cardiology office requesting patient be set up for a 30 day cardiac monitor and can be mailed to home.  Resumed all her other home medications.  Pt is feeling well.  Pt was seen by PT service and no further PT recommended.  Pt is being discharged home in stable condition.     2D Echocardiogram 08/07/23 IMPRESSIONS   1. Left ventricular ejection fraction, by estimation, is >75%. The left ventricle has hyperdynamic function. The left ventricle has no regional wall motion abnormalities. There is moderate asymmetric left ventricular hypertrophy of the septal segment.  Left ventricular diastolic parameters are consistent with Grade I diastolic dysfunction (impaired relaxation).   2. Right ventricular systolic function is normal. The right ventricular size is normal. There is moderately elevated pulmonary artery systolic pressure. The estimated right ventricular systolic pressure is 46.7 mmHg.   3. Left atrial size was moderately dilated.   4. The mitral valve is degenerative. Mild mitral valve regurgitation. Moderate mitral annular calcification.   5. The aortic valve is tricuspid. There is mild calcification of the aortic valve. Aortic valve regurgitation is not visualized. Moderate aortic valve sclerosis/calcification to mild stenosis is present. Aortic valve mean gradient measures 12.5 mmHg. Aortic valve Vmax measures 2.31 m/s.   6. Aortic dilatation noted. There is mild dilatation of the ascending aorta, measuring 39 mm.   7. The inferior vena cava is normal  Physician Discharge Summary  [name] GNF:[id number] DOB: [birth year] DOA: 08/06/2023  PCP: Carylon Perches, MD  Admit date: 08/06/2023 Discharge date: 08/07/2023  Admitted From:  HOME  Disposition: HOME   Recommendations for Outpatient Follow-up:  Follow up with PCP in 1 weeks Follow up with [facility] in 3 months  30 day cardiac monitor to be mailed to home from cardio office Please check lipid panel in 3 months  Discharge Condition: STABLE   CODE STATUS: FULL  DIET: heart healthy low sodium and cholesterol   Brief Hospitalization Summary: Please see all hospital notes, images, labs for full details of the hospitalization. Admission Provider HPI:   82 y.o. female with medical history significant of chronic hypoxic respiratory failure, PCV, TIA, endometrial CA s/p hysterectomy 2012, GERD w/hiatal hernia, HTN, and chronic HFpEF who presented to the ED with abrupt onset of dizziness, gait instability, and nausea this morning. She lives alone, usually gets around with a walker without issues, was in her usual state of health last night when she went to sleep. When getting up this morning at ~3:45am she felt dizzy, feeling like she was falling backward with walker. Had nausea to the point that she couldn't take her medications. She called her daughter who tried zofran without improvement, so she came in. Had an episode of vomiting. She has had no other focal weakness, numbness. Daughter says she has no change to her facial features and no dysarthria. Still works, lives alone. Work up in ED revealed acute right parietal infarct on MRI for which neurology was consulted and recommended admission. Neurology is formally consulted.   Hospital Course Pt was admitted with dizziness and concern for acute CVA.  Her MRI did show a tiny punctate right parietal stroke thought to be incidental finding by neurologist.  She was seen by neurology for formal consultation and her dizziness has resolved now.   Neurology recommends that she continue aspirin 81 mg daily, changed simvastatin to atorvastatin 40 mg daily due to suboptimal LDL level and recommended 30 day cardiac monitor.  I have sent message to L. Pinnix at [facility] cardiology office requesting patient be set up for a 30 day cardiac monitor and can be mailed to home.  Resumed all her other home medications.  Pt is feeling well.  Pt was seen by PT service and no further PT recommended.  Pt is being discharged home in stable condition.     2D Echocardiogram 08/07/23 IMPRESSIONS   1. Left ventricular ejection fraction, by estimation, is >75%. The left ventricle has hyperdynamic function. The left ventricle has no regional wall motion abnormalities. There is moderate asymmetric left ventricular hypertrophy of the septal segment.  Left ventricular diastolic parameters are consistent with Grade I diastolic dysfunction (impaired relaxation).   2. Right ventricular systolic function is normal. The right ventricular size is normal. There is moderately elevated pulmonary artery systolic pressure. The estimated right ventricular systolic pressure is 46.7 mmHg.   3. Left atrial size was moderately dilated.   4. The mitral valve is degenerative. Mild mitral valve regurgitation. Moderate mitral annular calcification.   5. The aortic valve is tricuspid. There is mild calcification of the aortic valve. Aortic valve regurgitation is not visualized. Moderate aortic valve sclerosis/calcification to mild stenosis is present. Aortic valve mean gradient measures 12.5 mmHg. Aortic valve Vmax measures 2.31 m/s.   6. Aortic dilatation noted. There is mild dilatation of the ascending aorta, measuring 39 mm.   7. The inferior vena cava is normal  Follow Up Contact information: [address] [place] [address] [phone]               Allergies  Allergen Reactions   Carbamazepine Hives and Other (See Comments)    headache   Sulfa Antibiotics     Rash   Allergies as of 08/07/2023       Reactions   Carbamazepine Hives, Other (See Comments)   headache   Sulfa Antibiotics    Rash        Medication List     STOP taking these medications    simvastatin 10 MG tablet Commonly known as: ZOCOR       TAKE these medications    acetaminophen 650 MG CR tablet Commonly known as: TYLENOL Take 1,300 mg by mouth 2 (two) times daily.   aspirin EC 81 MG tablet Take 81 mg by mouth at bedtime.   atorvastatin 40 MG tablet Commonly known as: LIPITOR Take 1 tablet (40 mg total) by mouth daily.   carvedilol 12.5 MG tablet Commonly known as: COREG Take 0.5 tablets (6.25 mg  total) by mouth 2 (two) times daily with a meal. Take 1/2 tablet by 2 times daily What changed: how much to take   cloNIDine 0.3 MG tablet Commonly known as: CATAPRES Take 0.15 mg by mouth 2 (two) times daily.   diltiazem 180 MG 24 hr capsule Commonly known as: CARDIZEM CD Take 180 mg by mouth daily.   esomeprazole 40 MG capsule Commonly known as: NEXIUM Take 1 capsule (40 mg total) by mouth every morning.   famotidine 20 MG tablet Commonly known as: Pepcid One after supper   furosemide 40 MG tablet Commonly known as: LASIX Take 1 tablet (40 mg total) by mouth daily.   GAS RELIEF PO Take 1 tablet by mouth daily.   hydroxyurea 500 MG capsule Commonly known as: HYDREA Take 2 capsules (1,000 mg total) by mouth every Monday, Wednesday, and Friday AND 3 capsules (1,500 mg total) every Tuesday, Thursday, Saturday, and Sunday. You can split to 1500 mg dose into 2 tablets in the AM and 1 tablet PM..   isosorbide mononitrate 30 MG 24 hr tablet Commonly known as: IMDUR TAKE 1/2 (ONE-HALF) TABLET BY MOUTH AT BEDTIME What changed: See the new instructions.   Jardiance 10 MG Tabs tablet Generic drug: empagliflozin TAKE 1 TABLET BY MOUTH ONCE DAILY BEFORE BREAKFAST   letrozole 2.5 MG tablet Commonly known as: FEMARA Take 1 tablet by mouth once daily   losartan 100 MG tablet Commonly known as: COZAAR Take 100 mg by mouth every evening.   PRESERVISION AREDS PO Take by mouth.   Vitamin D (Ergocalciferol) 1.25 MG (50000 UNIT) Caps capsule Commonly known as: DRISDOL Take 1 capsule (50,000 Units total) by mouth every 14 (fourteen) days.        Procedures/Studies: ECHOCARDIOGRAM COMPLETE  Result Date: 08/07/2023    ECHOCARDIOGRAM REPORT   Patient Name:   [name] Date of Exam: 08/07/2023 Medical Rec #:  [id number]        Height:       64.0 in Accession #:    [id number]       Weight:       22 7.7 lb Date of Birth:  05-19-1941        BSA:          2.067 m Patient Age:    82  years         BP:  Follow Up Contact information: [address] [place] [address] [phone]               Allergies  Allergen Reactions   Carbamazepine Hives and Other (See Comments)    headache   Sulfa Antibiotics     Rash   Allergies as of 08/07/2023       Reactions   Carbamazepine Hives, Other (See Comments)   headache   Sulfa Antibiotics    Rash        Medication List     STOP taking these medications    simvastatin 10 MG tablet Commonly known as: ZOCOR       TAKE these medications    acetaminophen 650 MG CR tablet Commonly known as: TYLENOL Take 1,300 mg by mouth 2 (two) times daily.   aspirin EC 81 MG tablet Take 81 mg by mouth at bedtime.   atorvastatin 40 MG tablet Commonly known as: LIPITOR Take 1 tablet (40 mg total) by mouth daily.   carvedilol 12.5 MG tablet Commonly known as: COREG Take 0.5 tablets (6.25 mg  total) by mouth 2 (two) times daily with a meal. Take 1/2 tablet by 2 times daily What changed: how much to take   cloNIDine 0.3 MG tablet Commonly known as: CATAPRES Take 0.15 mg by mouth 2 (two) times daily.   diltiazem 180 MG 24 hr capsule Commonly known as: CARDIZEM CD Take 180 mg by mouth daily.   esomeprazole 40 MG capsule Commonly known as: NEXIUM Take 1 capsule (40 mg total) by mouth every morning.   famotidine 20 MG tablet Commonly known as: Pepcid One after supper   furosemide 40 MG tablet Commonly known as: LASIX Take 1 tablet (40 mg total) by mouth daily.   GAS RELIEF PO Take 1 tablet by mouth daily.   hydroxyurea 500 MG capsule Commonly known as: HYDREA Take 2 capsules (1,000 mg total) by mouth every Monday, Wednesday, and Friday AND 3 capsules (1,500 mg total) every Tuesday, Thursday, Saturday, and Sunday. You can split to 1500 mg dose into 2 tablets in the AM and 1 tablet PM..   isosorbide mononitrate 30 MG 24 hr tablet Commonly known as: IMDUR TAKE 1/2 (ONE-HALF) TABLET BY MOUTH AT BEDTIME What changed: See the new instructions.   Jardiance 10 MG Tabs tablet Generic drug: empagliflozin TAKE 1 TABLET BY MOUTH ONCE DAILY BEFORE BREAKFAST   letrozole 2.5 MG tablet Commonly known as: FEMARA Take 1 tablet by mouth once daily   losartan 100 MG tablet Commonly known as: COZAAR Take 100 mg by mouth every evening.   PRESERVISION AREDS PO Take by mouth.   Vitamin D (Ergocalciferol) 1.25 MG (50000 UNIT) Caps capsule Commonly known as: DRISDOL Take 1 capsule (50,000 Units total) by mouth every 14 (fourteen) days.        Procedures/Studies: ECHOCARDIOGRAM COMPLETE  Result Date: 08/07/2023    ECHOCARDIOGRAM REPORT   Patient Name:   [name] Date of Exam: 08/07/2023 Medical Rec #:  [id number]        Height:       64.0 in Accession #:    [id number]       Weight:       22 7.7 lb Date of Birth:  05-19-1941        BSA:          2.067 m Patient Age:    82  years         BP:  Physician Discharge Summary  [name] GNF:[id number] DOB: [birth year] DOA: 08/06/2023  PCP: Carylon Perches, MD  Admit date: 08/06/2023 Discharge date: 08/07/2023  Admitted From:  HOME  Disposition: HOME   Recommendations for Outpatient Follow-up:  Follow up with PCP in 1 weeks Follow up with [facility] in 3 months  30 day cardiac monitor to be mailed to home from cardio office Please check lipid panel in 3 months  Discharge Condition: STABLE   CODE STATUS: FULL  DIET: heart healthy low sodium and cholesterol   Brief Hospitalization Summary: Please see all hospital notes, images, labs for full details of the hospitalization. Admission Provider HPI:   82 y.o. female with medical history significant of chronic hypoxic respiratory failure, PCV, TIA, endometrial CA s/p hysterectomy 2012, GERD w/hiatal hernia, HTN, and chronic HFpEF who presented to the ED with abrupt onset of dizziness, gait instability, and nausea this morning. She lives alone, usually gets around with a walker without issues, was in her usual state of health last night when she went to sleep. When getting up this morning at ~3:45am she felt dizzy, feeling like she was falling backward with walker. Had nausea to the point that she couldn't take her medications. She called her daughter who tried zofran without improvement, so she came in. Had an episode of vomiting. She has had no other focal weakness, numbness. Daughter says she has no change to her facial features and no dysarthria. Still works, lives alone. Work up in ED revealed acute right parietal infarct on MRI for which neurology was consulted and recommended admission. Neurology is formally consulted.   Hospital Course Pt was admitted with dizziness and concern for acute CVA.  Her MRI did show a tiny punctate right parietal stroke thought to be incidental finding by neurologist.  She was seen by neurology for formal consultation and her dizziness has resolved now.   Neurology recommends that she continue aspirin 81 mg daily, changed simvastatin to atorvastatin 40 mg daily due to suboptimal LDL level and recommended 30 day cardiac monitor.  I have sent message to L. Pinnix at [facility] cardiology office requesting patient be set up for a 30 day cardiac monitor and can be mailed to home.  Resumed all her other home medications.  Pt is feeling well.  Pt was seen by PT service and no further PT recommended.  Pt is being discharged home in stable condition.     2D Echocardiogram 08/07/23 IMPRESSIONS   1. Left ventricular ejection fraction, by estimation, is >75%. The left ventricle has hyperdynamic function. The left ventricle has no regional wall motion abnormalities. There is moderate asymmetric left ventricular hypertrophy of the septal segment.  Left ventricular diastolic parameters are consistent with Grade I diastolic dysfunction (impaired relaxation).   2. Right ventricular systolic function is normal. The right ventricular size is normal. There is moderately elevated pulmonary artery systolic pressure. The estimated right ventricular systolic pressure is 46.7 mmHg.   3. Left atrial size was moderately dilated.   4. The mitral valve is degenerative. Mild mitral valve regurgitation. Moderate mitral annular calcification.   5. The aortic valve is tricuspid. There is mild calcification of the aortic valve. Aortic valve regurgitation is not visualized. Moderate aortic valve sclerosis/calcification to mild stenosis is present. Aortic valve mean gradient measures 12.5 mmHg. Aortic valve Vmax measures 2.31 m/s.   6. Aortic dilatation noted. There is mild dilatation of the ascending aorta, measuring 39 mm.   7. The inferior vena cava is normal  Follow Up Contact information: [address] [place] [address] [phone]               Allergies  Allergen Reactions   Carbamazepine Hives and Other (See Comments)    headache   Sulfa Antibiotics     Rash   Allergies as of 08/07/2023       Reactions   Carbamazepine Hives, Other (See Comments)   headache   Sulfa Antibiotics    Rash        Medication List     STOP taking these medications    simvastatin 10 MG tablet Commonly known as: ZOCOR       TAKE these medications    acetaminophen 650 MG CR tablet Commonly known as: TYLENOL Take 1,300 mg by mouth 2 (two) times daily.   aspirin EC 81 MG tablet Take 81 mg by mouth at bedtime.   atorvastatin 40 MG tablet Commonly known as: LIPITOR Take 1 tablet (40 mg total) by mouth daily.   carvedilol 12.5 MG tablet Commonly known as: COREG Take 0.5 tablets (6.25 mg  total) by mouth 2 (two) times daily with a meal. Take 1/2 tablet by 2 times daily What changed: how much to take   cloNIDine 0.3 MG tablet Commonly known as: CATAPRES Take 0.15 mg by mouth 2 (two) times daily.   diltiazem 180 MG 24 hr capsule Commonly known as: CARDIZEM CD Take 180 mg by mouth daily.   esomeprazole 40 MG capsule Commonly known as: NEXIUM Take 1 capsule (40 mg total) by mouth every morning.   famotidine 20 MG tablet Commonly known as: Pepcid One after supper   furosemide 40 MG tablet Commonly known as: LASIX Take 1 tablet (40 mg total) by mouth daily.   GAS RELIEF PO Take 1 tablet by mouth daily.   hydroxyurea 500 MG capsule Commonly known as: HYDREA Take 2 capsules (1,000 mg total) by mouth every Monday, Wednesday, and Friday AND 3 capsules (1,500 mg total) every Tuesday, Thursday, Saturday, and Sunday. You can split to 1500 mg dose into 2 tablets in the AM and 1 tablet PM..   isosorbide mononitrate 30 MG 24 hr tablet Commonly known as: IMDUR TAKE 1/2 (ONE-HALF) TABLET BY MOUTH AT BEDTIME What changed: See the new instructions.   Jardiance 10 MG Tabs tablet Generic drug: empagliflozin TAKE 1 TABLET BY MOUTH ONCE DAILY BEFORE BREAKFAST   letrozole 2.5 MG tablet Commonly known as: FEMARA Take 1 tablet by mouth once daily   losartan 100 MG tablet Commonly known as: COZAAR Take 100 mg by mouth every evening.   PRESERVISION AREDS PO Take by mouth.   Vitamin D (Ergocalciferol) 1.25 MG (50000 UNIT) Caps capsule Commonly known as: DRISDOL Take 1 capsule (50,000 Units total) by mouth every 14 (fourteen) days.        Procedures/Studies: ECHOCARDIOGRAM COMPLETE  Result Date: 08/07/2023    ECHOCARDIOGRAM REPORT   Patient Name:   [name] Date of Exam: 08/07/2023 Medical Rec #:  [id number]        Height:       64.0 in Accession #:    [id number]       Weight:       22 7.7 lb Date of Birth:  05-19-1941        BSA:          2.067 m Patient Age:    82  years         BP:  Follow Up Contact information: [address] [place] [address] [phone]               Allergies  Allergen Reactions   Carbamazepine Hives and Other (See Comments)    headache   Sulfa Antibiotics     Rash   Allergies as of 08/07/2023       Reactions   Carbamazepine Hives, Other (See Comments)   headache   Sulfa Antibiotics    Rash        Medication List     STOP taking these medications    simvastatin 10 MG tablet Commonly known as: ZOCOR       TAKE these medications    acetaminophen 650 MG CR tablet Commonly known as: TYLENOL Take 1,300 mg by mouth 2 (two) times daily.   aspirin EC 81 MG tablet Take 81 mg by mouth at bedtime.   atorvastatin 40 MG tablet Commonly known as: LIPITOR Take 1 tablet (40 mg total) by mouth daily.   carvedilol 12.5 MG tablet Commonly known as: COREG Take 0.5 tablets (6.25 mg  total) by mouth 2 (two) times daily with a meal. Take 1/2 tablet by 2 times daily What changed: how much to take   cloNIDine 0.3 MG tablet Commonly known as: CATAPRES Take 0.15 mg by mouth 2 (two) times daily.   diltiazem 180 MG 24 hr capsule Commonly known as: CARDIZEM CD Take 180 mg by mouth daily.   esomeprazole 40 MG capsule Commonly known as: NEXIUM Take 1 capsule (40 mg total) by mouth every morning.   famotidine 20 MG tablet Commonly known as: Pepcid One after supper   furosemide 40 MG tablet Commonly known as: LASIX Take 1 tablet (40 mg total) by mouth daily.   GAS RELIEF PO Take 1 tablet by mouth daily.   hydroxyurea 500 MG capsule Commonly known as: HYDREA Take 2 capsules (1,000 mg total) by mouth every Monday, Wednesday, and Friday AND 3 capsules (1,500 mg total) every Tuesday, Thursday, Saturday, and Sunday. You can split to 1500 mg dose into 2 tablets in the AM and 1 tablet PM..   isosorbide mononitrate 30 MG 24 hr tablet Commonly known as: IMDUR TAKE 1/2 (ONE-HALF) TABLET BY MOUTH AT BEDTIME What changed: See the new instructions.   Jardiance 10 MG Tabs tablet Generic drug: empagliflozin TAKE 1 TABLET BY MOUTH ONCE DAILY BEFORE BREAKFAST   letrozole 2.5 MG tablet Commonly known as: FEMARA Take 1 tablet by mouth once daily   losartan 100 MG tablet Commonly known as: COZAAR Take 100 mg by mouth every evening.   PRESERVISION AREDS PO Take by mouth.   Vitamin D (Ergocalciferol) 1.25 MG (50000 UNIT) Caps capsule Commonly known as: DRISDOL Take 1 capsule (50,000 Units total) by mouth every 14 (fourteen) days.        Procedures/Studies: ECHOCARDIOGRAM COMPLETE  Result Date: 08/07/2023    ECHOCARDIOGRAM REPORT   Patient Name:   [name] Date of Exam: 08/07/2023 Medical Rec #:  [id number]        Height:       64.0 in Accession #:    [id number]       Weight:       22 7.7 lb Date of Birth:  05-19-1941        BSA:          2.067 m Patient Age:    82  years         BP:  Physician Discharge Summary  [name] GNF:[id number] DOB: [birth year] DOA: 08/06/2023  PCP: Carylon Perches, MD  Admit date: 08/06/2023 Discharge date: 08/07/2023  Admitted From:  HOME  Disposition: HOME   Recommendations for Outpatient Follow-up:  Follow up with PCP in 1 weeks Follow up with [facility] in 3 months  30 day cardiac monitor to be mailed to home from cardio office Please check lipid panel in 3 months  Discharge Condition: STABLE   CODE STATUS: FULL  DIET: heart healthy low sodium and cholesterol   Brief Hospitalization Summary: Please see all hospital notes, images, labs for full details of the hospitalization. Admission Provider HPI:   82 y.o. female with medical history significant of chronic hypoxic respiratory failure, PCV, TIA, endometrial CA s/p hysterectomy 2012, GERD w/hiatal hernia, HTN, and chronic HFpEF who presented to the ED with abrupt onset of dizziness, gait instability, and nausea this morning. She lives alone, usually gets around with a walker without issues, was in her usual state of health last night when she went to sleep. When getting up this morning at ~3:45am she felt dizzy, feeling like she was falling backward with walker. Had nausea to the point that she couldn't take her medications. She called her daughter who tried zofran without improvement, so she came in. Had an episode of vomiting. She has had no other focal weakness, numbness. Daughter says she has no change to her facial features and no dysarthria. Still works, lives alone. Work up in ED revealed acute right parietal infarct on MRI for which neurology was consulted and recommended admission. Neurology is formally consulted.   Hospital Course Pt was admitted with dizziness and concern for acute CVA.  Her MRI did show a tiny punctate right parietal stroke thought to be incidental finding by neurologist.  She was seen by neurology for formal consultation and her dizziness has resolved now.   Neurology recommends that she continue aspirin 81 mg daily, changed simvastatin to atorvastatin 40 mg daily due to suboptimal LDL level and recommended 30 day cardiac monitor.  I have sent message to L. Pinnix at [facility] cardiology office requesting patient be set up for a 30 day cardiac monitor and can be mailed to home.  Resumed all her other home medications.  Pt is feeling well.  Pt was seen by PT service and no further PT recommended.  Pt is being discharged home in stable condition.     2D Echocardiogram 08/07/23 IMPRESSIONS   1. Left ventricular ejection fraction, by estimation, is >75%. The left ventricle has hyperdynamic function. The left ventricle has no regional wall motion abnormalities. There is moderate asymmetric left ventricular hypertrophy of the septal segment.  Left ventricular diastolic parameters are consistent with Grade I diastolic dysfunction (impaired relaxation).   2. Right ventricular systolic function is normal. The right ventricular size is normal. There is moderately elevated pulmonary artery systolic pressure. The estimated right ventricular systolic pressure is 46.7 mmHg.   3. Left atrial size was moderately dilated.   4. The mitral valve is degenerative. Mild mitral valve regurgitation. Moderate mitral annular calcification.   5. The aortic valve is tricuspid. There is mild calcification of the aortic valve. Aortic valve regurgitation is not visualized. Moderate aortic valve sclerosis/calcification to mild stenosis is present. Aortic valve mean gradient measures 12.5 mmHg. Aortic valve Vmax measures 2.31 m/s.   6. Aortic dilatation noted. There is mild dilatation of the ascending aorta, measuring 39 mm.   7. The inferior vena cava is normal  Follow Up Contact information: [address] [place] [address] [phone]               Allergies  Allergen Reactions   Carbamazepine Hives and Other (See Comments)    headache   Sulfa Antibiotics     Rash   Allergies as of 08/07/2023       Reactions   Carbamazepine Hives, Other (See Comments)   headache   Sulfa Antibiotics    Rash        Medication List     STOP taking these medications    simvastatin 10 MG tablet Commonly known as: ZOCOR       TAKE these medications    acetaminophen 650 MG CR tablet Commonly known as: TYLENOL Take 1,300 mg by mouth 2 (two) times daily.   aspirin EC 81 MG tablet Take 81 mg by mouth at bedtime.   atorvastatin 40 MG tablet Commonly known as: LIPITOR Take 1 tablet (40 mg total) by mouth daily.   carvedilol 12.5 MG tablet Commonly known as: COREG Take 0.5 tablets (6.25 mg  total) by mouth 2 (two) times daily with a meal. Take 1/2 tablet by 2 times daily What changed: how much to take   cloNIDine 0.3 MG tablet Commonly known as: CATAPRES Take 0.15 mg by mouth 2 (two) times daily.   diltiazem 180 MG 24 hr capsule Commonly known as: CARDIZEM CD Take 180 mg by mouth daily.   esomeprazole 40 MG capsule Commonly known as: NEXIUM Take 1 capsule (40 mg total) by mouth every morning.   famotidine 20 MG tablet Commonly known as: Pepcid One after supper   furosemide 40 MG tablet Commonly known as: LASIX Take 1 tablet (40 mg total) by mouth daily.   GAS RELIEF PO Take 1 tablet by mouth daily.   hydroxyurea 500 MG capsule Commonly known as: HYDREA Take 2 capsules (1,000 mg total) by mouth every Monday, Wednesday, and Friday AND 3 capsules (1,500 mg total) every Tuesday, Thursday, Saturday, and Sunday. You can split to 1500 mg dose into 2 tablets in the AM and 1 tablet PM..   isosorbide mononitrate 30 MG 24 hr tablet Commonly known as: IMDUR TAKE 1/2 (ONE-HALF) TABLET BY MOUTH AT BEDTIME What changed: See the new instructions.   Jardiance 10 MG Tabs tablet Generic drug: empagliflozin TAKE 1 TABLET BY MOUTH ONCE DAILY BEFORE BREAKFAST   letrozole 2.5 MG tablet Commonly known as: FEMARA Take 1 tablet by mouth once daily   losartan 100 MG tablet Commonly known as: COZAAR Take 100 mg by mouth every evening.   PRESERVISION AREDS PO Take by mouth.   Vitamin D (Ergocalciferol) 1.25 MG (50000 UNIT) Caps capsule Commonly known as: DRISDOL Take 1 capsule (50,000 Units total) by mouth every 14 (fourteen) days.        Procedures/Studies: ECHOCARDIOGRAM COMPLETE  Result Date: 08/07/2023    ECHOCARDIOGRAM REPORT   Patient Name:   [name] Date of Exam: 08/07/2023 Medical Rec #:  [id number]        Height:       64.0 in Accession #:    [id number]       Weight:       22 7.7 lb Date of Birth:  05-19-1941        BSA:          2.067 m Patient Age:    82  years         BP:

## 2023-08-07 NOTE — TOC Transition Note (Signed)
Transition of Care [facility]) - CM/SW Discharge Note   Patient Details  Name: [name] MRN: [id number] Date of Birth: 1940-12-10  [facility] CM/SW Contact:  Leitha Bleak, RN Phone Number: 08/07/2023, 1:26 PM   Clinical Narrative:   Patient will follow up with PCP. Independent at discharge, no needs per RN.     Final next level of care: Home/Self Care Barriers to Discharge: Barriers Resolved   Patient Goals and CMS Choice CMS Medicare.gov Compare Post Acute Care list provided to:: Patient Choice offered to / list presented to : Patient  Discharge Placement           Patient and family notified of of transfer: 08/07/23  Discharge Plan and Services Additional resources added to the After Visit Summary for        Social Determinants of Health (SDOH) Interventions SDOH Screenings   Food Insecurity: No Food Insecurity (08/06/2023)  Housing: Low Risk  (08/06/2023)  Transportation Needs: No Transportation Needs (08/06/2023)  Utilities: Not At Risk (08/06/2023)  Alcohol Screen: Low Risk  (09/11/2022)  Depression (PHQ2-9): Low Risk  (09/11/2022)  Financial Resource Strain: Low Risk  (09/11/2022)  Physical Activity: Sufficiently Active (09/11/2022)  Social Connections: Moderately Integrated (09/11/2022)  Stress: No Stress Concern Present (09/11/2022)  Tobacco Use: Low Risk  (08/06/2023)    Readmission Risk Interventions    08/07/2023    1:26 PM  Readmission Risk Prevention Plan  Post Dischage Appt Complete  Medication Screening Complete  Transportation Screening Complete

## 2023-08-07 NOTE — Progress Notes (Signed)
  Echocardiogram 2D Echocardiogram has been performed.  [name] 08/07/2023, 8:43 AM

## 2023-08-07 NOTE — Discharge Instructions (Addendum)
A 30 day cardiac monitor should be mailed to you as part of stroke work up.    IMPORTANT INFORMATION: PAY CLOSE ATTENTION   PHYSICIAN DISCHARGE INSTRUCTIONS  Follow with Primary care provider  Carylon Perches, MD  and other consultants as instructed by your Hospitalist Physician  SEEK MEDICAL CARE OR RETURN TO EMERGENCY ROOM IF SYMPTOMS COME BACK, WORSEN OR NEW PROBLEM DEVELOPS   Please note: You were cared for by a hospitalist during your hospital stay. Every effort will be made to forward records to your primary care provider.  You can request that your primary care provider send for your hospital records if they have not received them.  Once you are discharged, your primary care physician will handle any further medical issues. Please note that NO REFILLS for any discharge medications will be authorized once you are discharged, as it is imperative that you return to your primary care physician (or establish a relationship with a primary care physician if you do not have one) for your post hospital discharge needs so that they can reassess your need for medications and monitor your lab values.  Please get a complete blood count and chemistry panel checked by your Primary MD at your next visit, and again as instructed by your Primary MD.  Get Medicines reviewed and adjusted: Please take all your medications with you for your next visit with your Primary MD  Laboratory/radiological data: Please request your Primary MD to go over all hospital tests and procedure/radiological results at the follow up, please ask your primary care provider to get all Hospital records sent to his/her office.  In some cases, they will be blood work, cultures and biopsy results pending at the time of your discharge. Please request that your primary care provider follow up on these results.  If you are diabetic, please bring your blood sugar readings with you to your follow up appointment with primary care.    Please  call and make your follow up appointments as soon as possible.    Also Note the following: If you experience worsening of your admission symptoms, develop shortness of breath, life threatening emergency, suicidal or homicidal thoughts you must seek medical attention immediately by calling 911 or calling your MD immediately  if symptoms less severe.  You must read complete instructions/literature along with all the possible adverse reactions/side effects for all the Medicines you take and that have been prescribed to you. Take any new Medicines after you have completely understood and accpet all the possible adverse reactions/side effects.   Do not drive when taking Pain medications or sleeping medications (Benzodiazepines)  Do not take more than prescribed Pain, Sleep and Anxiety Medications. It is not advisable to combine anxiety,sleep and pain medications without talking with your primary care practitioner  Special Instructions: If you have smoked or chewed Tobacco  in the last 2 yrs please stop smoking, stop any regular Alcohol  and or any Recreational drug use.  Wear Seat belts while driving.  Do not drive if taking any narcotic, mind altering or controlled substances or recreational drugs or alcohol.

## 2023-08-10 ENCOUNTER — Ambulatory Visit (HOSPITAL_COMMUNITY): Payer: Medicare Other

## 2023-08-12 DIAGNOSIS — I498 Other specified cardiac arrhythmias: Secondary | ICD-10-CM | POA: Diagnosis not present

## 2023-08-12 DIAGNOSIS — I499 Cardiac arrhythmia, unspecified: Secondary | ICD-10-CM

## 2023-08-13 ENCOUNTER — Ambulatory Visit: Payer: Medicare Other | Attending: Cardiology

## 2023-08-13 DIAGNOSIS — H353221 Exudative age-related macular degeneration, left eye, with active choroidal neovascularization: Secondary | ICD-10-CM | POA: Diagnosis not present

## 2023-08-14 DIAGNOSIS — I10 Essential (primary) hypertension: Secondary | ICD-10-CM | POA: Diagnosis not present

## 2023-08-14 DIAGNOSIS — Z0189 Encounter for other specified special examinations: Secondary | ICD-10-CM | POA: Diagnosis not present

## 2023-08-14 DIAGNOSIS — Z823 Family history of stroke: Secondary | ICD-10-CM | POA: Diagnosis not present

## 2023-08-15 DIAGNOSIS — H353211 Exudative age-related macular degeneration, right eye, with active choroidal neovascularization: Secondary | ICD-10-CM | POA: Diagnosis not present

## 2023-08-17 ENCOUNTER — Telehealth: Payer: Self-pay | Admitting: Cardiology

## 2023-08-17 NOTE — Telephone Encounter (Signed)
Patient was calling to ask if Dr.McDowell could see her recent hospitalization records and I reassured her that they are in the Epic system. She has f/u on 09/04/23 with him and has already seen her pcp and will follow up with neuro.

## 2023-08-17 NOTE — Telephone Encounter (Signed)
Pt is requesting a callback from nurse regarding her wanting to discuss her most recent hospital visit and ECHO she had done there. Please advise

## 2023-08-27 DIAGNOSIS — D3131 Benign neoplasm of right choroid: Secondary | ICD-10-CM | POA: Diagnosis not present

## 2023-08-27 DIAGNOSIS — H35363 Drusen (degenerative) of macula, bilateral: Secondary | ICD-10-CM | POA: Diagnosis not present

## 2023-08-27 DIAGNOSIS — H35453 Secondary pigmentary degeneration, bilateral: Secondary | ICD-10-CM | POA: Diagnosis not present

## 2023-08-27 DIAGNOSIS — H353231 Exudative age-related macular degeneration, bilateral, with active choroidal neovascularization: Secondary | ICD-10-CM | POA: Diagnosis not present

## 2023-08-27 DIAGNOSIS — Z961 Presence of intraocular lens: Secondary | ICD-10-CM | POA: Diagnosis not present

## 2023-08-27 DIAGNOSIS — H26492 Other secondary cataract, left eye: Secondary | ICD-10-CM | POA: Diagnosis not present

## 2023-08-27 DIAGNOSIS — H43813 Vitreous degeneration, bilateral: Secondary | ICD-10-CM | POA: Diagnosis not present

## 2023-09-04 ENCOUNTER — Encounter: Payer: Self-pay | Admitting: Cardiology

## 2023-09-04 ENCOUNTER — Ambulatory Visit: Payer: Medicare Other | Attending: Cardiology | Admitting: Cardiology

## 2023-09-04 ENCOUNTER — Other Ambulatory Visit: Payer: Self-pay | Admitting: Hematology

## 2023-09-04 VITALS — BP 162/84 | HR 70 | Ht 64.0 in | Wt 230.6 lb

## 2023-09-04 DIAGNOSIS — I350 Nonrheumatic aortic (valve) stenosis: Secondary | ICD-10-CM | POA: Diagnosis not present

## 2023-09-04 DIAGNOSIS — Z8673 Personal history of transient ischemic attack (TIA), and cerebral infarction without residual deficits: Secondary | ICD-10-CM

## 2023-09-04 DIAGNOSIS — I259 Chronic ischemic heart disease, unspecified: Secondary | ICD-10-CM | POA: Diagnosis not present

## 2023-09-04 DIAGNOSIS — I5032 Chronic diastolic (congestive) heart failure: Secondary | ICD-10-CM | POA: Diagnosis not present

## 2023-09-04 DIAGNOSIS — I272 Pulmonary hypertension, unspecified: Secondary | ICD-10-CM | POA: Diagnosis not present

## 2023-09-04 MED ORDER — SPIRONOLACTONE 25 MG PO TABS: 12.5000 mg | ORAL_TABLET | Freq: Every day | ORAL | 3 refills | Status: DC

## 2023-09-04 NOTE — Progress Notes (Signed)
Cardiology Office Note  Date: 09/04/2023   ID: Alesi, Kroft [birth year], MRN [id number]  History of Present Illness: [name] is an 82 y.o. female last seen in May.  She is here today for a follow-up visit with her daughter. Records indicate hospitalization in early October for evaluation of possible strokelike symptoms.  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  Follow-up cardiac monitor was recommended by neurology to exclude atrial arrhythmia.  She is still wearing a 30-day event recorder, plans to turn it in over the weekend.  She does not report any palpitations.  Recent follow-up echocardiogram is noted below.  LVEF hyperdynamic greater than 75% with moderate septal hypertrophy and mild diastolic dysfunction, RV contraction normal with moderately elevated RVSP at 47 mmHg.  She also had mild calcific aortic stenosis.  I reviewed her medications.  Current regimen includes aspirin, Lipitor, Coreg, clonidine, Cardizem CD, Jardiance, Lasix, Imdur, and Cozaar.  We discussed addition of low-dose Aldactone to round out her regimen also for more optimal blood pressure control at this point.  She has follow-up with hematology in early November, I reviewed the prior note.  Physical Exam: VS:  BP (!) 162/84 (BP Location: Right Arm, Patient Position: Sitting, Cuff Size: Large)   Pulse 70   Ht 5\' 4"  (1.626 m)   Wt 230 lb 9.6 oz (104.6 kg)   SpO2 95%   BMI 39.58 kg/m , BMI Body mass index is 39.58 kg/m.  Wt Readings from Last 3 Encounters:  09/04/23 230 lb 9.6 oz (104.6 kg)  08/06/23 227 lb 11.2 oz (103.3 kg)  06/14/23 230 lb (104.3 kg)    General: Patient appears comfortable at rest. HEENT: Conjunctiva and lids normal. Neck: Supple, no elevated JVP or carotid bruits. Lungs: Clear to auscultation, nonlabored breathing at rest. Cardiac: Regular rate and rhythm, no S3, 4/6 systolic murmur, no pericardial rub. Extremities: Chronic  lymphedema.  ECG:  An ECG dated 08/06/2023 was personally reviewed today and demonstrated:  Sinus rhythm with left atrial enlargement and right bundle branch block.  Labwork: 08/06/2023: ALT 14; AST 19; BUN 15; Creatinine, Ser 0.44; Hemoglobin 15.6; Magnesium 2.0; Platelets 211; Potassium 3.5; Sodium 140 08/07/2023: B Natriuretic Peptide 117.0     Component Value Date/Time   CHOL 137 08/07/2023 0413   TRIG 83 08/07/2023 0413   HDL 46 08/07/2023 0413   CHOLHDL 3.0 08/07/2023 0413   VLDL 17 08/07/2023 0413   LDLCALC 74 08/07/2023 0413   Other Studies Reviewed Today:  Echocardiogram 08/07/2023:  1. Left ventricular ejection fraction, by estimation, is >75%. The left  ventricle has hyperdynamic function. The left ventricle has no regional  wall motion abnormalities. There is moderate asymmetric left ventricular  hypertrophy of the septal segment.  Left ventricular diastolic parameters are consistent with Grade I  diastolic dysfunction (impaired relaxation).   2. Right ventricular systolic function is normal. The right ventricular  size is normal. There is moderately elevated pulmonary artery systolic  pressure. The estimated right ventricular systolic pressure is 46.7 mmHg.   3. Left atrial size was moderately dilated.   4. The mitral valve is degenerative. Mild mitral valve regurgitation.  Moderate mitral annular calcification.   5. The aortic valve is tricuspid. There is mild calcification of the  aortic valve. Aortic valve regurgitation is not visualized. Moderate  aortic valve sclerosis/calcification to mild stenosis is present. Aortic  valve mean gradient measures 12.5 mmHg.  Aortic valve Vmax measures 2.31 m/s.  6. Aortic dilatation noted. There is mild dilatation of the ascending  aorta, measuring 39 mm.   7. The inferior vena cava is normal in size with <50% respiratory  variability, suggesting right atrial pressure of 8 mmHg.   Assessment and Plan:  1.  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  Plan is medical therapy at this point following previous discussions.  Recent follow-up echocardiogram revealed vigorous LVEF with moderate asymmetric septal hypertrophy and grade 1 diastolic dysfunction.  RV contraction normal with estimated RVSP approximately 47 mmHg (improved).  Continue to focus on treatment of diastolic heart failure, she is on Jardiance and Lasix, will add Aldactone 12.5 mg daily.  She will have routine lab work at visit with hematology in early November.   2.  Ischemic heart disease based on prior noninvasive cardiac imaging.  She does not describe any angina.  Continue aspirin, Imdur and Lipitor.   3.  Primary hypertension.  She is also on clonidine, Cardizem CD, Coreg, and losartan.  Adding low-dose Aldactone as discussed above.   4.  Lymphedema.   5.  Aortic stenosis, mild by recent follow-up echocardiogram, mean AV gradient 12.5 mmHg.Marland Kitchen  She has a prominent systolic murmur which is most likely combination of asymmetric septal LV hypertrophy with hyperdynamic LVEF, PCV, and aortic stenosis.  Disposition:  Follow up  3 months.  Signed, Jonelle Sidle, M.D., F.A.C.C.  HeartCare at [facility]

## 2023-09-04 NOTE — Patient Instructions (Signed)
Medication Instructions:   START Aldactone 12.5 mg daily  Labwork: None today  Testing/Procedures: None today  Follow-Up: 3 months  Any Other Special Instructions Will Be Listed Below (If Applicable).  If you need a refill on your cardiac medications before your next appointment, please call your pharmacy.

## 2023-09-12 ENCOUNTER — Inpatient Hospital Stay: Payer: Medicare Other

## 2023-09-12 ENCOUNTER — Inpatient Hospital Stay: Payer: Medicare Other | Attending: Hematology

## 2023-09-12 DIAGNOSIS — Z1722 Progesterone receptor negative status: Secondary | ICD-10-CM | POA: Diagnosis not present

## 2023-09-12 DIAGNOSIS — R0602 Shortness of breath: Secondary | ICD-10-CM | POA: Insufficient documentation

## 2023-09-12 DIAGNOSIS — Z79811 Long term (current) use of aromatase inhibitors: Secondary | ICD-10-CM | POA: Diagnosis not present

## 2023-09-12 DIAGNOSIS — H538 Other visual disturbances: Secondary | ICD-10-CM | POA: Diagnosis not present

## 2023-09-12 DIAGNOSIS — M7989 Other specified soft tissue disorders: Secondary | ICD-10-CM | POA: Diagnosis not present

## 2023-09-12 DIAGNOSIS — Z801 Family history of malignant neoplasm of trachea, bronchus and lung: Secondary | ICD-10-CM | POA: Insufficient documentation

## 2023-09-12 DIAGNOSIS — C50412 Malignant neoplasm of upper-outer quadrant of left female breast: Secondary | ICD-10-CM | POA: Diagnosis not present

## 2023-09-12 DIAGNOSIS — Z90710 Acquired absence of both cervix and uterus: Secondary | ICD-10-CM | POA: Diagnosis not present

## 2023-09-12 DIAGNOSIS — R5383 Other fatigue: Secondary | ICD-10-CM | POA: Diagnosis not present

## 2023-09-12 DIAGNOSIS — R200 Anesthesia of skin: Secondary | ICD-10-CM | POA: Insufficient documentation

## 2023-09-12 DIAGNOSIS — R609 Edema, unspecified: Secondary | ICD-10-CM | POA: Diagnosis not present

## 2023-09-12 DIAGNOSIS — R252 Cramp and spasm: Secondary | ICD-10-CM | POA: Diagnosis not present

## 2023-09-12 DIAGNOSIS — Z882 Allergy status to sulfonamides status: Secondary | ICD-10-CM | POA: Insufficient documentation

## 2023-09-12 DIAGNOSIS — R42 Dizziness and giddiness: Secondary | ICD-10-CM | POA: Diagnosis not present

## 2023-09-12 DIAGNOSIS — G629 Polyneuropathy, unspecified: Secondary | ICD-10-CM | POA: Diagnosis not present

## 2023-09-12 DIAGNOSIS — Z79899 Other long term (current) drug therapy: Secondary | ICD-10-CM | POA: Insufficient documentation

## 2023-09-12 DIAGNOSIS — Z8542 Personal history of malignant neoplasm of other parts of uterus: Secondary | ICD-10-CM | POA: Diagnosis not present

## 2023-09-12 DIAGNOSIS — Z8249 Family history of ischemic heart disease and other diseases of the circulatory system: Secondary | ICD-10-CM | POA: Insufficient documentation

## 2023-09-12 DIAGNOSIS — G479 Sleep disorder, unspecified: Secondary | ICD-10-CM | POA: Insufficient documentation

## 2023-09-12 DIAGNOSIS — E669 Obesity, unspecified: Secondary | ICD-10-CM | POA: Insufficient documentation

## 2023-09-12 DIAGNOSIS — R519 Headache, unspecified: Secondary | ICD-10-CM | POA: Diagnosis not present

## 2023-09-12 DIAGNOSIS — D45 Polycythemia vera: Secondary | ICD-10-CM | POA: Insufficient documentation

## 2023-09-12 DIAGNOSIS — R232 Flushing: Secondary | ICD-10-CM | POA: Diagnosis not present

## 2023-09-12 DIAGNOSIS — Z8673 Personal history of transient ischemic attack (TIA), and cerebral infarction without residual deficits: Secondary | ICD-10-CM | POA: Diagnosis not present

## 2023-09-12 DIAGNOSIS — Z8261 Family history of arthritis: Secondary | ICD-10-CM | POA: Insufficient documentation

## 2023-09-12 DIAGNOSIS — Z170 Estrogen receptor positive status [ER+]: Secondary | ICD-10-CM | POA: Diagnosis not present

## 2023-09-12 DIAGNOSIS — D75839 Thrombocytosis, unspecified: Secondary | ICD-10-CM | POA: Insufficient documentation

## 2023-09-12 DIAGNOSIS — Z90721 Acquired absence of ovaries, unilateral: Secondary | ICD-10-CM | POA: Insufficient documentation

## 2023-09-12 LAB — COMPREHENSIVE METABOLIC PANEL
ALT: 12 U/L (ref 0–44)
AST: 18 U/L (ref 15–41)
Albumin: 3.7 g/dL (ref 3.5–5.0)
Alkaline Phosphatase: 80 U/L (ref 38–126)
Anion gap: 9 (ref 5–15)
BUN: 19 mg/dL (ref 8–23)
CO2: 29 mmol/L (ref 22–32)
Calcium: 9.2 mg/dL (ref 8.9–10.3)
Chloride: 100 mmol/L (ref 98–111)
Creatinine, Ser: 0.49 mg/dL (ref 0.44–1.00)
GFR, Estimated: >60 mL/min (ref >60)
Glucose, Bld: 85 mg/dL (ref 70–99)
Potassium: 3.8 mmol/L (ref 3.5–5.1)
Sodium: 138 mmol/L (ref 135–145)
Total Bilirubin: 0.8 mg/dL (ref <1.2)
Total Protein: 7.1 g/dL (ref 6.5–8.1)

## 2023-09-12 LAB — LACTATE DEHYDROGENASE: LDH: 147 U/L (ref 98–192)

## 2023-09-12 LAB — CBC WITH DIFFERENTIAL/PLATELET
Abs Immature Granulocytes: 0.01 10*3/uL (ref 0.00–0.07)
Basophils Absolute: 0 10*3/uL (ref 0.0–0.1)
Basophils Relative: 1 %
Eosinophils Absolute: 0.1 10*3/uL (ref 0.0–0.5)
Eosinophils Relative: 2 %
HCT: 43.6 % (ref 36.0–46.0)
Hemoglobin: 14.4 g/dL (ref 12.0–15.0)
Immature Granulocytes: 0 %
Lymphocytes Relative: 27 %
Lymphs Abs: 1.2 10*3/uL (ref 0.7–4.0)
MCH: 40.3 pg — ABNORMAL HIGH (ref 26.0–34.0)
MCHC: 33 g/dL (ref 30.0–36.0)
MCV: 122.1 fL — ABNORMAL HIGH (ref 80.0–100.0)
Monocytes Absolute: 0.4 10*3/uL (ref 0.1–1.0)
Monocytes Relative: 8 %
Neutro Abs: 2.7 10*3/uL (ref 1.7–7.7)
Neutrophils Relative %: 62 %
Platelets: 209 10*3/uL (ref 150–400)
RBC: 3.57 MIL/uL — ABNORMAL LOW (ref 3.87–5.11)
RDW: 13.2 % (ref 11.5–15.5)
WBC: 4.4 10*3/uL (ref 4.0–10.5)
nRBC: 0 % (ref 0.0–0.2)

## 2023-09-13 ENCOUNTER — Ambulatory Visit: Payer: Medicare Other | Admitting: Internal Medicine

## 2023-09-13 ENCOUNTER — Encounter: Payer: Self-pay | Admitting: Internal Medicine

## 2023-09-13 VITALS — BP 124/74 | HR 68 | Ht 64.0 in | Wt 230.0 lb

## 2023-09-13 DIAGNOSIS — R0609 Other forms of dyspnea: Secondary | ICD-10-CM | POA: Diagnosis not present

## 2023-09-13 DIAGNOSIS — J9611 Chronic respiratory failure with hypoxia: Secondary | ICD-10-CM

## 2023-09-13 NOTE — Patient Instructions (Signed)
No change in medications  Make sure you check your oxygen saturation  AT  your highest level of activity (not after you stop)   to be sure it stays over 90% and adjust  02 flow upward to maintain this level if needed but remember to turn it back to previous settings when you stop (to conserve your supply).   Please schedule a follow up visit in 6 months but call sooner if needed

## 2023-09-13 NOTE — Assessment & Plan Note (Signed)
Started on 02 post op in 2012  - 05/23/2022   Walked on 3lpm  x  3  lap(s) =  approx 450  ft  @ slow/ rollator  pace, stopped due to end of study with lowest 02 sats 90%   - ONO on 2lpm 05/31/22  desat < 89% x 6 h so rec 3lpm and repeat on 3lpm 06/06/2022 >>> pt refused  So ok to leave on 2lpm for now - 06/17/22  ono on 3lpm  Only 7 min desats > no change rx - 06/14/2023   Walked on 3lpm cont  x  2  lap(s) =  approx 300  ft  @ mod fast for rollator pace, stopped due to sob with lowest 02 sats 90%    Adequate control on present rx, reviewed in detail with pt > no change in rx needed    Reminded: 3lpm hs Make sure you check your oxygen saturation  AT  your highest level of activity (not after you stop)   to be sure it stays over 90% and adjust  02 flow upward to maintain this level if needed but remember to turn it back to previous settings when you stop (to conserve your supply).   F/u q 6 m sooner if needed          Each maintenance medication was reviewed in detail including emphasizing most importantly the difference between maintenance and prns and under what circumstances the prns are to be triggered using an action plan format where appropriate.  Total time for H and P, chart review, counseling, reviewing 02/pulse ox  device(s) and generating customized AVS unique to this office visit / same day charting = 25 min

## 2023-09-13 NOTE — Assessment & Plan Note (Signed)
Onset 2012 p abd hysterectomy at wt 272  - Echo  09/13/21 1. Left ventricular ejection fraction, by estimation, is 65 to 70%. The  left ventricle has normal function.  There is moderate left ventricular hypertrophy.   2. Right ventricular systolic function is normal. The right ventricular  size is normal. There is severely elevated pulmonary artery systolic  pressure.   3. Left atrial size was severely dilated.   4. The mitral valve is abnormal. No evidence of mitral valve  regurgitation. Mild to moderate mitral stenosis. Moderate mitral annular  calcification.   5. The tricuspid valve is abnormal.   6. The aortic valve is tricuspid. There is moderate calcification of the  aortic valve. There is moderate thickening of the aortic valve. Aortic  valve regurgitation is not visualized. Mild aortic valve stenosis. Aortic  valve mean gradient measures 10.0  mmHg. Aortic valve peak gradient measures 22.7 mmHg. Aortic valve area, by  VTI measures 2.11 cm   7. The inferior vena cava is dilated in size with >50% respiratory  variability, suggesting right atrial pressure of 8 mmHg.  8.Right Atrium: Right atrial size was normal in size -  PFT's  03/23/11   FEV1 1.31 (61 % ) ratio 0.71  p 0 % improvement from saba p ? prior to study with DLCO  19.4 (73% %)  and FV curve min concave and ERV  370 cc at wt 270   - PFT's  06/26/2022   FEV1 1.11(62 % ) ratio 0.88  p 0 % improvement from saba p nothing prior to study with DLCO  13.73 (76%)  and FV curve nl  and ERV 150 (44%)  at wt 240 - Echo 08/07/23  no significant change x for PAS down to 46 from 08/2022   Well compensated chf/ now on aldactone/f/u by Dr Wyline Mood planned

## 2023-09-13 NOTE — Progress Notes (Signed)
[name], female    DOB: 28-Sep-1941    MRN: [id number]   Brief patient profile:  73  yowf  never smoker  referred to pulmonary clinic in [place]  05/23/2022 by Dr Diona Browner  for unexplained hypoxemia dating back to time of  abd hysterectomy in 2012 at wt 271 and supplied with 02 but rarely used it but noted difficulty from [facility] parking into her work around summer 2022      History of Present Illness  05/23/2022  Pulmonary/ 1st office eval/ [name] / Sales executive Complaint  Patient presents with   Consult    Patient was seeing Dr. [name] has been on O2 for years- prn during day and sometimes at night. She states her breathing has worsened over last year.   Dyspnea:  50 ft and 02 drops Cough: just hoarse  Sleep: 30 degree on side - immediate smothering if lies flat  SABA use: not sure they help  Vax x 2 for covid / never infected  Rec Try using 2lpm at bedtime for now and we will do an overnight study to be sure that's enough  Make sure you check your oxygen saturation  AT  your highest level of activity (not after you stop)   to be sure it stays over 90%  We will have the [employer] rep call you re supplying you with a portable oxygen concentrator but it may not be enough to keep your 02 sats above 90% which is the goal here. My office will be contacting you by phone for referral for High resolution CT of chest Continue Nexium 40 mg Take 30-60 min before first meal of the day  GERD diet  We will schedule a return to my office in [place] with PFTs same day.     06/14/2023  f/u ov/[place] office/[name] re: PF maint on 02   Chief Complaint  Patient presents with   DOE  Dyspnea:  using rollator x 15 min circular drive stopping maybe once on 3lpm cont lowest 90%   Cough: min dry assoc pnd Sleeping: 45 degrees no resp cc  SABA use: none  02: 3lpm hs and prn at rest up to 3lpm cont with ex  Rec Pantoprazole (protonix) 40 mg   Take  30-60 min before first meal of the  day and add Pepcid (famotidine)  20 mg after supper until return to office - this is the best way to tell whether stomach acid is contributing to your problem.   GERD diet reviewed, bed blocks rec  For itching /sneezing / cough zyrtec 10 mg in evening as needed - works x 24 h Make sure you check your oxygen saturation  AT  your highest level of activity (not after you stop)   to be sure it stays over 90%  Please schedule a follow up visit in 3 months but call sooner if needed    09/13/2023  f/u ov/[place] office/[name] re: PF  maint on prn ex 02   Chief Complaint  Patient presents with   Shortness of Breath   Dyspnea:  circular drive on rollator sats 95% on RA Cough: none  Sleeping: electric hob 45 degrees s    resp cc  SABA use: none  02: 3lpm hs    No obvious day to day or daytime variability or assoc excess/ purulent sputum or mucus plugs or hemoptysis or cp or chest tightness, subjective wheeze or overt sinus or hb symptoms.    Also  denies any obvious fluctuation of symptoms with weather or environmental changes or other aggravating or alleviating factors except as outlined above   No unusual exposure hx or h/o childhood pna/ asthma or knowledge of premature birth.  Current Allergies, Complete Past Medical History, Past Surgical History, Family History, and Social History were reviewed in [employer] record.  ROS  The following are not active complaints unless bolded Hoarseness, sore throat, dysphagia, dental problems, itching, sneezing,  nasal congestion or discharge of excess mucus or purulent secretions, ear ache,   fever, chills, sweats, unintended wt loss or wt gain, classically pleuritic or exertional cp,  orthopnea pnd or arm/hand swelling  or leg swelling, presyncope, palpitations, abdominal pain, anorexia, nausea, vomiting, diarrhea  or change in bowel habits or change in bladder habits, change in stools or change in urine, dysuria, hematuria,  rash,  arthralgias, visual complaints, headache, numbness, weakness or ataxia or problems with walking/ uses rollator or coordination,  change in mood or  memory.        Current Meds  Medication Sig   acetaminophen (TYLENOL) 650 MG CR tablet Take 1,300 mg by mouth 2 (two) times daily.   aspirin EC 81 MG tablet Take 81 mg by mouth at bedtime.    atorvastatin (LIPITOR) 40 MG tablet Take 1 tablet (40 mg total) by mouth daily.   carvedilol (COREG) 12.5 MG tablet Take 0.5 tablets (6.25 mg total) by mouth 2 (two) times daily with a meal. Take 1/2 tablet by 2 times daily   cloNIDine (CATAPRES) 0.3 MG tablet Take 0.15 mg by mouth 2 (two) times daily.   diltiazem (CARDIZEM CD) 180 MG 24 hr capsule Take 180 mg by mouth daily.    empagliflozin (JARDIANCE) 10 MG TABS tablet TAKE 1 TABLET BY MOUTH ONCE DAILY BEFORE BREAKFAST   esomeprazole (NEXIUM) 40 MG capsule Take 1 capsule (40 mg total) by mouth every morning.   famotidine (PEPCID) 20 MG tablet One after supper   furosemide (LASIX) 40 MG tablet Take 1 tablet (40 mg total) by mouth daily.   hydroxyurea (HYDREA) 500 MG capsule Take 2 capsules (1,000 mg total) by mouth every Monday, Wednesday, and Friday AND 3 capsules (1,500 mg total) every Tuesday, Thursday, Saturday, and Sunday. You can split to 1500 mg dose into 2 tablets in the AM and 1 tablet PM..   isosorbide mononitrate (IMDUR) 30 MG 24 hr tablet TAKE 1/2 (ONE-HALF) TABLET BY MOUTH AT BEDTIME (Patient taking differently: 15 mg at bedtime.)   letrozole (FEMARA) 2.5 MG tablet Take 1 tablet by mouth once daily   losartan (COZAAR) 100 MG tablet Take 100 mg by mouth every evening.    Multiple Vitamins-Minerals (PRESERVISION AREDS PO) Take by mouth.   Simethicone (GAS RELIEF PO) Take 1 tablet by mouth daily.   spironolactone (ALDACTONE) 25 MG tablet Take 0.5 tablets (12.5 mg total) by mouth daily.   Vitamin D, Ergocalciferol, (DRISDOL) 1.25 MG (50000 UNIT) CAPS capsule Take 1 capsule (50,000 Units total) by  mouth every 14 (fourteen) days.                  Past Medical History:  Diagnosis Date   Arthritis    Chronic diastolic heart failure (HCC)    Essential hypertension    GERD (gastroesophageal reflux disease)    Gout    Hiatal hernia    History of endometrial cancer    Sp hysterectomy 2012   Ischemic colitis (HCC) 2016   Myeloproliferative disorder, JAK-2 positive  Obesity    Pneumonia    Polycythemia vera(238.4)    TIA (transient ischemic attack)        Objective:    Wts  09/13/2023        230   06/14/2023         230   06/26/22 240 lb 3.2 oz (109 kg)  05/23/22 244 lb (110.7 kg)  05/18/22 238 lb 6.4 oz (108.1 kg)    Vital signs reviewed  09/13/2023  - Note at rest 02 sats  92% on RA    General appearance:    elderly wf walking with rollator      HEENT : Oropharynx  clear/ no teeth      Nasal turbinates nl    NECK :  without  apparent JVD/ palpable Nodes/TM    LUNGS: no acc muscle use,  Nl contour chest with minimal insp crackles R > L base bilaterally without cough on insp or exp maneuvers   CV:  RRR  no s3 or 3/6 sem slt increase in P2, and  1-2 + pitting both LE  edema / no hose   ABD:  soft and nontender with nl inspiratory excursion in the supine position. No bruits or organomegaly appreciated   MS:  Nl gait/ ext warm without deformities Or obvious joint restrictions  calf tenderness, cyanosis or clubbing    SKIN: warm and dry without lesions    NEURO:  alert, approp, nl sensorium with  no motor or cerebellar deficits apparent.           Assessment

## 2023-09-18 NOTE — Progress Notes (Unsigned)
[facility] [address], [place] [address]   CLINIC:  Medical Oncology/Hematology  PCP:  Carylon Perches, MD [address] / [place] [address] [phone]   REASON FOR VISIT:  - History of left-sided breast cancer (September 2018) - JAK2 + polycythemia vera/essential thrombocytosis   PRIOR THERAPY: Left-sided lumpectomy with adjuvant XRT   CURRENT THERAPY: - Letrozole daily since January 2019 - Hydrea [current dose 1000 mg MWF and 1500 mg TTSS]  BRIEF ONCOLOGIC HISTORY:   Oncology History  Malignant neoplasm of upper-outer quadrant of left breast in female, estrogen receptor positive (HCC)  07/10/2017 Surgery   Left lumpectomy: IDC grade 2, 1.3 cm, intermediate grade DCIS, margins negative, 1/2 lymph nodes positive, ER 5% positive weak staining, PR 0%, HER-2 negative ratio 1.73, Ki-67 15%, T1cN0 stage IA AJCC 8   07/10/2017 Miscellaneous   Mammaprint: Low risk, 10-year risk of recurrence untreated 10%   10/01/2017 - 11/14/2017 Radiation Therapy   Adj XRT   11/2017 -  Anti-estrogen oral therapy   Letrozole daily     CANCER STAGING:  Cancer Staging  Malignant neoplasm of upper-outer quadrant of left breast in female, estrogen receptor positive (HCC) Staging form: Breast, AJCC 8th Edition - Clinical stage from 05/22/2017: Stage IA (cT1, cN0, cM0, G1, ER+, PR-, HER2-) - Signed by Hubbard Hartshorn, NP on 06/22/2017 - Pathologic: Stage IIA (pT1c, pN1a, cM0, G2, ER+, PR-, HER2-) - Unsigned   INTERVAL HISTORY:   [name], a 82 y.o. female, returns for routine follow-up of her JAK2 PV/ET and history of left-sided breast cancer. [name] was last seen by Rojelio Brenner PA-C on 06/13/2023.  Since her last visit, she was hospitalized from 08/06/2023 through 08/07/2023 for dizziness and concern for acute CVA.  Her MRI did show tiny punctate right parietal stroke thought to be incidental finding by neurologist.  Symptoms had resolved at  discharge.  She is taking Hydrea 1000 mg MWF and 1500 mg TTSS, tolerating this well.  She does note some new onset tingling in her fingertips and legs, described as mild and intermittent.  She reports some headaches in her temples, which she thinks may be related to her eye injections. She denies any skin sores, mouth sores, or GI side effects. She denies any vasomotor symptoms or thrombotic events. She has progressive macular degeneration causing blurry vision, but denies any intermittent or transient vision changes. No B symptoms.  She has leg swelling that is equal bilaterally, chronic and stable.  She denies any breast lumps, new-onset bone pain (she has chronic left shoulder pain), B symptoms, or neurologic changes.  She is tolerating letrozole fairly well with some occasional hot flashes and leg cramps.  She reports 75% energy and 100% appetite.  She is maintaining stable weight at this time.  ASSESSMENT & PLAN:  1.  JAK2+ polycythemia vera - JAK2 [id number] mutation detected in 2015 - No prior history of thrombosis - Hospitalized 08/06/2023 through 08/07/2023 for dizziness and concern for acute CVA.  MRI did show tiny punctate right parietal stroke thought to be incidental finding by neurologist. - Hydroxyurea was started in November 2019 - Current dose of Hydrea is 1000 mg daily MWF and 1500 mg TTSS (mild neuropathy on current dose - unable to tolerate 1500 mg daily due to increased fatigue and peripheral neuropathy) - No vasomotor symptoms, erythromelalgia, or aquagenic pruritus  - She is taking aspirin 81 mg daily. - Most recent labs (09/12/2023): Platelets 209, Hgb 14.4/hematocrit  43.6%.  CMP and LDH normal. -Therapeutic goal is platelets <400 and HCT <45.0%. - PLAN: Blood counts currently at goal. - Continue Hydrea 1000 mg MWF and 1500 mg TTSS - Labs and follow-up 3 months  - Continue aspirin 81 mg daily - Patient is aware of alarm symptoms that would prompt more immediate medical  attention.   2.  T1CN1A (stage IIa) malignant neoplasm of upper-outer quadrant of left breast - Left breast biopsy on 05/29/2017 of the upper outer quadrant mass - IDC, grade 1/2.  ER 5%, PR 0%, Ki-67 15%, HER-2 negative. - Left breast lumpectomy on 07/10/2017, IDC, grade 2, 1.3 cm, margins negative, 1/2 lymph nodes positive, pT1c, PN 1A - MammaPrint-low risk luminal A, average 10-year risk of recurrence untreated 10%. - Adjuvant XRT from 10/01/2017-11/15/2017. - Letrozole 1 mg daily started on 11/15/2017. - BCI testing (October 2023) showed that patient would likely benefit from extended endocrine therapy.  (8.5% risk of late distant recurrence after 5 years of adjuvant endocrine therapy, compared to 2.8% - 3.6% risk of late distant recurrence after 10 total years of adjuvant endocrine therapy) - She is tolerating LETROZOLE with occasional hot flashes and leg cramps, which are improved  - Reviewed mammogram from 05/30/2023, BI-RADS Category 1, negative.  No mammographic evidence of malignancy in either breast. -  Physical exam at last visit (06/13/2023) showed no suspicious mass.  She did have some mild tenderness and palpation of freely mobile cystic structure in left medial breast tissue  - ROS did not reveal any red flag symptoms of recurrence    - Most recent labs show baseline CBC and normal CMP - PLAN: Continue letrozole.  (If she continues to have significant leg cramps, would consider switching to alternative aromatase inhibitor.)  Goal of treatment is 10 years (through January 2029) based on BCI results. - Next mammogram due July 2025.   - Breast exam annually and as needed, next due August 2025    3.  Osteopenia with aromatase inhibitor induced bone loss - Bone density scan (05/24/2021): T score -1.0, normal - Bone density scan (05/30/2023): T-score -1.3, osteopenic - Labs (05/23/2023): Vitamin D level is 62.46, calcium 9.0 - FRAX as calculated by radiologist at time of DEXA scan showed 11.3%  probability of major osteoporotic fracture within the next 10 years and 2.6% probability of hip fracture within the next 10 years. Since FRAX does not take into account risk of fracture from AI therapy, some experts adjust for this by counting patient as "RA +" when calculating FRAX.  With this adjustment, patient has 15% risk of major osteoporotic fracture and 3.7% risk of hip fracture within the next 10 years. - Pharmacologic treatment of osteopenia/osteoporosis is generally recommended in the following scenarios: T score </= 2.5, with history of fragility factor. T score between -1.0 and -2.5 who have risks for fracture other than AI therapy. T score between -1.0 and 2.5 to have 10-year probability of hip fracture > 3% or 10-year risk of major osteoporotic fracture > 20% based on FRAX. - Patient would potentially benefit from pharmacological treatment of osteopenia (with low-dose alendronate 35 mg weekly) due to meeting criteria of 10-year probability of hip fracture >3%, when adjusted for aromatase inhibitor use  - Optimal duration of therapy not established, but would consider discontinuing after 5 years if BMD is stable and if her short-term fracture risk remains low.  If she has presence of fragility fracture or high risk during that timeframe, would consider extending therapy up  to 10 years or switching to alternative therapy. - Discussed with patient that bisphosphonate therapy is generally well-tolerated, but potential complications include transient flulike symptoms, renal insufficiency, hypocalcemia, atypical femur fractures, and osteonecrosis of the jaw. - We discussed the importance of maintaining adequate calcium and vitamin D and continuing with weightbearing exercises to improve bone mass. - PLAN: Continue vitamin D 50,000 units every other week. - We discussed her mild osteopenia and potential benefit from low-dose bisphosphonate.  Since osteopenia is mild, she agrees with holding off on  pharmacologic treatment at this time. - In the setting of aromatase inhibitor use, we will continue to check bone density scan every 2 to 3 years, next due July 2026.  Will check vitamin D with next labs   4.  Social/family history: - She was recently widowed in February 2022.  She is living independently.  She works part-time for 6 hours a day, office job.  She is non-smoker. - Father had lung cancer.  PLAN SUMMARY:  >> Labs in 3 months = CBC/D, CMP, LDH >> OFFICE visit in 3 months    REVIEW OF SYSTEMS:  Review of Systems  Constitutional:  Positive for fatigue. Negative for appetite change, chills, diaphoresis, fever and unexpected weight change.  HENT:   Negative for lump/mass and nosebleeds.   Eyes:  Negative for eye problems.  Respiratory:  Positive for shortness of breath (with exertion, baseline). Negative for cough and hemoptysis.   Cardiovascular:  Negative for chest pain, leg swelling and palpitations.  Gastrointestinal:  Negative for abdominal pain, blood in stool, constipation, diarrhea, nausea and vomiting.  Genitourinary:  Negative for hematuria.   Skin: Negative.   Neurological:  Positive for headaches and numbness. Negative for dizziness and light-headedness.  Hematological:  Does not bruise/bleed easily.  Psychiatric/Behavioral:  Positive for sleep disturbance. Negative for depression. The patient is not nervous/anxious.     PHYSICAL EXAM:  Performance status (ECOG): 3 - Symptomatic, >50% confined to bed  Wt Readings from Last 3 Encounters:  09/13/23 230 lb (104.3 kg)  09/04/23 230 lb 9.6 oz (104.6 kg)  08/06/23 227 lb 11.2 oz (103.3 kg)   Physical Exam Constitutional:      Appearance: Normal appearance. She is obese.  Cardiovascular:     Heart sounds: Murmur (Left sternal border, grade 4/6, systolic) heard.  Pulmonary:     Breath sounds: Normal breath sounds.  Chest:  Breasts:    Right: Normal.  Musculoskeletal:     Right lower leg: Edema present.      Left lower leg: Edema present.  Neurological:     General: No focal deficit present.     Mental Status: Mental status is at baseline.  Psychiatric:        Behavior: Behavior normal. Behavior is cooperative.      PAST MEDICAL/SURGICAL HISTORY:  Past Medical History:  Diagnosis Date   Arthritis    Chronic diastolic heart failure (HCC)    Essential hypertension    GERD (gastroesophageal reflux disease)    Gout    Hiatal hernia    History of endometrial cancer    Sp hysterectomy 2012   Ischemic colitis (HCC) 2016   Myeloproliferative disorder, JAK-2 positive    Obesity    Pneumonia    Polycythemia vera(238.4)    TIA (transient ischemic attack)    Past Surgical History:  Procedure Laterality Date   ABDOMINAL HYSTERECTOMY  09/12/2011   Procedure: HYSTERECTOMY ABDOMINAL;  Surgeon: Laurette Schimke, MD;  Location: [facility];  Service:  Gynecology;  Laterality: N/A;  Total Abdominal Hysterectomy, Bilateral Salpingo Oophorectomy   BREAST LUMPECTOMY Left 07/10/2017   LEFT BREAST LUMPECTOMY WITH RADIOACTIVE SEED AND SENTINEL LYMPH NODE BIOPSY    BREAST LUMPECTOMY WITH RADIOACTIVE SEED AND SENTINEL LYMPH NODE BIOPSY Left 07/10/2017   Procedure: LEFT BREAST LUMPECTOMY WITH RADIOACTIVE SEED AND SENTINEL LYMPH NODE BIOPSY;  Surgeon: Emelia Loron, MD;  Location[facility];  Service: General;  Laterality: Left;   COLONOSCOPY  07/05/2012   Procedure: COLONOSCOPY;  Surgeon: Malissa Hippo, MD;  Location: [facility];  Service: Endoscopy;  Laterality: N/A;  730   EYE SURGERY     bilateral 8 - 12 yrs ago   FLEXIBLE SIGMOIDOSCOPY N/A 04/03/2015   Procedure: FLEXIBLE SIGMOIDOSCOPY;  Surgeon: Malissa Hippo, MD;  Location: [facility];  Service: Endoscopy;  Laterality: N/A;   HYSTEROSCOPY WITH D & C  08/15/2011   Procedure: DILATATION AND CURETTAGE (D&C) /HYSTEROSCOPY;  Surgeon: Tilda Burrow, MD;  Location: [facility];  Service: Gynecology;  Laterality: N/A;  With Suction Curette   JOINT REPLACEMENT      left knee replaced 2014   KNEE ARTHROPLASTY Left 03/15/2015   Procedure: COMPUTER ASSISTED TOTAL KNEE ARTHROPLASTY;  Surgeon: Eldred Manges, MD;  Location: [facility] OR;  Service: Orthopedics;  Laterality: Left;   SALPINGOOPHORECTOMY  09/12/2011   Procedure: SALPINGO OOPHERECTOMY;  Surgeon: Laurette Schimke, MD;  Location: [facility];  Service: Gynecology;  Laterality: Bilateral;    SOCIAL HISTORY:  Social History   Socioeconomic History   Marital status: Widowed    Spouse name: Not on file   Number of children: 2   Years of education: 12   Highest education level: High school graduate  Occupational History   Not on file  Tobacco Use   Smoking status: Never    Passive exposure: Never   Smokeless tobacco: Never  Vaping Use   Vaping status: Never Used  Substance and Sexual Activity   Alcohol use: No   Drug use: No   Sexual activity: Not Currently    Partners: Male    Birth control/protection: Surgical    Comment: hysterectomy  Other Topics Concern   Not on file  Social History Narrative   Not on file   Social Determinants of Health   Financial Resource Strain: Low Risk  (09/11/2022)   Overall Financial Resource Strain (CARDIA)    Difficulty of Paying Living Expenses: Not hard at all  Food Insecurity: No Food Insecurity (08/06/2023)   Hunger Vital Sign    Worried About Running Out of Food in the Last Year: Never true    Ran Out of Food in the Last Year: Never true  Transportation Needs: No Transportation Needs (08/06/2023)   PRAPARE - Administrator, Civil Service (Medical): No    Lack of Transportation (Non-Medical): No  Physical Activity: Sufficiently Active (09/11/2022)   Exercise Vital Sign    Days of Exercise per Week: 5 days    Minutes of Exercise per Session: 30 min  Stress: No Stress Concern Present (09/11/2022)   [employer] of Occupational Health - Occupational Stress Questionnaire    Feeling of Stress : Only a little  Social Connections: Moderately  Integrated (09/11/2022)   Social Connection and Isolation Panel [NHANES]    Frequency of Communication with Friends and Family: More than three times a week    Frequency of Social Gatherings with Friends and Family: More than three times a week    Attends Religious Services:  More than 4 times per year    Active Member of Clubs or Organizations: Yes    Attends Banker Meetings: More than 4 times per year    Marital Status: Widowed  Intimate Partner Violence: Not At Risk (08/06/2023)   Humiliation, Afraid, Rape, and Kick questionnaire    Fear of Current or Ex-Partner: No    Emotionally Abused: No    Physically Abused: No    Sexually Abused: No    FAMILY HISTORY:  Family History  Problem Relation Age of Onset   Hypertension Mother    Lung cancer Father    Arthritis/Rheumatoid Sister    Anesthesia problems Neg Hx    Hypotension Neg Hx    Malignant hyperthermia Neg Hx    Pseudochol deficiency Neg Hx     CURRENT MEDICATIONS:  Current Outpatient Medications  Medication Sig Dispense Refill   acetaminophen (TYLENOL) 650 MG CR tablet Take 1,300 mg by mouth 2 (two) times daily.     aspirin EC 81 MG tablet Take 81 mg by mouth at bedtime.      atorvastatin (LIPITOR) 40 MG tablet Take 1 tablet (40 mg total) by mouth daily. 30 tablet 2   carvedilol (COREG) 12.5 MG tablet Take 0.5 tablets (6.25 mg total) by mouth 2 (two) times daily with a meal. Take 1/2 tablet by 2 times daily     cloNIDine (CATAPRES) 0.3 MG tablet Take 0.15 mg by mouth 2 (two) times daily.     diltiazem (CARDIZEM CD) 180 MG 24 hr capsule Take 180 mg by mouth daily.      empagliflozin (JARDIANCE) 10 MG TABS tablet TAKE 1 TABLET BY MOUTH ONCE DAILY BEFORE BREAKFAST 30 tablet 6   esomeprazole (NEXIUM) 40 MG capsule Take 1 capsule (40 mg total) by mouth every morning. 30 capsule 11   famotidine (PEPCID) 20 MG tablet One after supper 30 tablet 11   furosemide (LASIX) 40 MG tablet Take 1 tablet (40 mg total) by mouth  daily.     isosorbide mononitrate (IMDUR) 30 MG 24 hr tablet TAKE 1/2 (ONE-HALF) TABLET BY MOUTH AT BEDTIME (Patient taking differently: 15 mg at bedtime.) 45 tablet 1   losartan (COZAAR) 100 MG tablet Take 100 mg by mouth every evening.      Multiple Vitamins-Minerals (PRESERVISION AREDS PO) Take by mouth.     Simethicone (GAS RELIEF PO) Take 1 tablet by mouth daily.     spironolactone (ALDACTONE) 25 MG tablet Take 0.5 tablets (12.5 mg total) by mouth daily. 45 tablet 3   Vitamin D, Ergocalciferol, (DRISDOL) 1.25 MG (50000 UNIT) CAPS capsule Take 1 capsule (50,000 Units total) by mouth every 14 (fourteen) days. 6 capsule 3   hydroxyurea (HYDREA) 500 MG capsule Take 2 capsules (1,000 mg total) by mouth every Monday, Wednesday, and Friday AND 3 capsules (1,500 mg total) every Tuesday, Thursday, Saturday, and Sunday. You can split to 1500 mg dose into 2 tablets in the AM and 1 tablet PM.. 80 capsule 3   letrozole (FEMARA) 2.5 MG tablet Take 1 tablet by mouth once daily 90 tablet 3   No current facility-administered medications for this visit.    ALLERGIES:  Allergies  Allergen Reactions   Carbamazepine Hives and Other (See Comments)    headache   Sulfa Antibiotics     Rash    LABORATORY DATA:  I have reviewed the labs as listed.     Latest Ref Rng & Units 09/12/2023    8:09 AM 08/06/2023  9:39 AM 05/23/2023   10:34 AM  CBC  WBC 4.0 - 10.5 K/uL 4.4  4.7  5.4   Hemoglobin 12.0 - 15.0 g/dL 40.9  81.1  91.4   Hematocrit 36.0 - 46.0 % 43.6  45.3  46.3   Platelets 150 - 400 K/uL 209  211  260       Latest Ref Rng & Units 09/12/2023    8:09 AM 08/06/2023    9:39 AM 05/23/2023   10:34 AM  CMP  Glucose 70 - 99 mg/dL 85  782  93   BUN 8 - 23 mg/dL 19  15  17    Creatinine 0.44 - 1.00 mg/dL 9.56  2.13  0.86   Sodium 135 - 145 mmol/L 138  140  141   Potassium 3.5 - 5.1 mmol/L 3.8  3.5  3.8   Chloride 98 - 111 mmol/L 100  102  105   CO2 22 - 32 mmol/L 29  26  31    Calcium 8.9 - 10.3 mg/dL  9.2  9.2  9.0   Total Protein 6.5 - 8.1 g/dL 7.1  7.2  6.9   Total Bilirubin <1.2 mg/dL 0.8  0.8  1.0   Alkaline Phos 38 - 126 U/L 80  88  83   AST 15 - 41 U/L 18  19  17    ALT 0 - 44 U/L 12  14  12      DIAGNOSTIC IMAGING:  I have independently reviewed the scans and discussed with the patient. No results found.   WRAP UP:  All questions were answered. The patient knows to call the clinic with any problems, questions or concerns.  Medical decision making: Moderate  Time spent on visit: I spent 20 minutes counseling the patient face to face. The total time spent in the appointment was 30 minutes and more than 50% was on counseling.  Carnella Guadalajara, PA-C  09/19/23 8:45 AM

## 2023-09-19 ENCOUNTER — Inpatient Hospital Stay (HOSPITAL_BASED_OUTPATIENT_CLINIC_OR_DEPARTMENT_OTHER): Payer: Medicare Other | Admitting: Physician Assistant

## 2023-09-19 DIAGNOSIS — R252 Cramp and spasm: Secondary | ICD-10-CM | POA: Diagnosis not present

## 2023-09-19 DIAGNOSIS — H538 Other visual disturbances: Secondary | ICD-10-CM | POA: Diagnosis not present

## 2023-09-19 DIAGNOSIS — Z882 Allergy status to sulfonamides status: Secondary | ICD-10-CM | POA: Diagnosis not present

## 2023-09-19 DIAGNOSIS — R0602 Shortness of breath: Secondary | ICD-10-CM | POA: Diagnosis not present

## 2023-09-19 DIAGNOSIS — R232 Flushing: Secondary | ICD-10-CM | POA: Diagnosis not present

## 2023-09-19 DIAGNOSIS — R609 Edema, unspecified: Secondary | ICD-10-CM | POA: Diagnosis not present

## 2023-09-19 DIAGNOSIS — R200 Anesthesia of skin: Secondary | ICD-10-CM | POA: Diagnosis not present

## 2023-09-19 DIAGNOSIS — C50412 Malignant neoplasm of upper-outer quadrant of left female breast: Secondary | ICD-10-CM

## 2023-09-19 DIAGNOSIS — D75839 Thrombocytosis, unspecified: Secondary | ICD-10-CM | POA: Diagnosis not present

## 2023-09-19 DIAGNOSIS — Z1722 Progesterone receptor negative status: Secondary | ICD-10-CM | POA: Diagnosis not present

## 2023-09-19 DIAGNOSIS — R5383 Other fatigue: Secondary | ICD-10-CM | POA: Diagnosis not present

## 2023-09-19 DIAGNOSIS — D45 Polycythemia vera: Secondary | ICD-10-CM | POA: Diagnosis not present

## 2023-09-19 DIAGNOSIS — D473 Essential (hemorrhagic) thrombocythemia: Secondary | ICD-10-CM | POA: Diagnosis not present

## 2023-09-19 DIAGNOSIS — Z79811 Long term (current) use of aromatase inhibitors: Secondary | ICD-10-CM | POA: Diagnosis not present

## 2023-09-19 DIAGNOSIS — Z170 Estrogen receptor positive status [ER+]: Secondary | ICD-10-CM | POA: Diagnosis not present

## 2023-09-19 DIAGNOSIS — R519 Headache, unspecified: Secondary | ICD-10-CM | POA: Diagnosis not present

## 2023-09-19 DIAGNOSIS — G629 Polyneuropathy, unspecified: Secondary | ICD-10-CM | POA: Diagnosis not present

## 2023-09-19 DIAGNOSIS — Z79899 Other long term (current) drug therapy: Secondary | ICD-10-CM | POA: Diagnosis not present

## 2023-09-19 DIAGNOSIS — Z8673 Personal history of transient ischemic attack (TIA), and cerebral infarction without residual deficits: Secondary | ICD-10-CM | POA: Diagnosis not present

## 2023-09-19 DIAGNOSIS — R42 Dizziness and giddiness: Secondary | ICD-10-CM | POA: Diagnosis not present

## 2023-09-19 DIAGNOSIS — M7989 Other specified soft tissue disorders: Secondary | ICD-10-CM | POA: Diagnosis not present

## 2023-09-19 DIAGNOSIS — G479 Sleep disorder, unspecified: Secondary | ICD-10-CM | POA: Diagnosis not present

## 2023-09-19 MED ORDER — HYDROXYUREA 500 MG PO CAPS
ORAL_CAPSULE | ORAL | 3 refills | Status: DC
Start: 2023-09-19 — End: 2024-01-25

## 2023-09-19 MED ORDER — LETROZOLE 2.5 MG PO TABS: ORAL_TABLET | ORAL | 3 refills | Status: DC

## 2023-09-19 NOTE — Patient Instructions (Signed)
[facility] at [facility] Discharge Instructions  You were seen today by Rojelio Brenner PA-C for the following conditions.  ESSENTIAL THROMBOCYTOSIS / POLYCYTHEMIA VERA: Your blood counts look excellent!  We will continue you on the same dose of Hydrea.   Take Hydrea 1000 mg (2 tablets) every morning on Monday, Wednesday, and Friday. Take Hydrea 1000 mg (2 tablets) every morning PLUS 500 mg (1 tablet) every evening on Tuesday, Thursday, Saturday, and Sunday.. We will check labs and see you for office visit again in 3 months, and see if you need additional dose adjustment. Continue aspirin 81 mg daily. If you have any new symptoms or side effects before your next visit, please call our office to let us know!  HISTORY OF LEFT BREAST CANCER: - Continue to take letrozole (Femara) as prescribed for your breast cancer. - You did not have any evidence of returning breast cancer on your most recent mammogram or physical exam. - Your next mammogram and breast exam will be due in July 2025  BONE HEALTH: Your bone density scan shows some slight worsening of your bone density, which now classifies as OSTEOPENIA.  Your osteopenia (weakened bones) is mild, but does place you at an increased risk of fracture.  However, you do not need any medical treatment of your osteopenia at this time.  We will check repeat bone density scan in 2 years.  MEDICATIONS: - Continue hydroxyurea (Hydrea) as above - Continue vitamin D once per week.  FOLLOW-UP APPOINTMENT: Office visit in 3 months, after labs   Thank you for choosing [facility] at [facility] to provide your oncology and hematology care.  To afford each patient quality time with our provider, please arrive at least 15 minutes before your scheduled appointment time.   If you have a lab appointment with the Cancer Center please come in thru the Main Entrance and check in at the main information desk.  You  need to re-schedule your appointment should you arrive 10 or more minutes late.  We strive to give you quality time with our providers, and arriving late affects you and other patients whose appointments are after yours.  Also, if you no show three or more times for appointments you may be dismissed from the clinic at the providers discretion.     Again, thank you for choosing [facility].  Our hope is that these requests will decrease the amount of time that you wait before being seen by our physicians.       _____________________________________________________________  Should you have questions after your visit to [facility], please contact our office at [phone] and follow the prompts.  Our office hours are 8:00 a.m. and 4:30 p.m. Monday - Friday.  Please note that voicemails left after 4:00 p.m. may not be returned until the following business day.  We are closed weekends and major holidays.  You do have access to a nurse 24-7, just call the main number to the clinic [phone] and do not press any options, hold on the line and a nurse will answer the phone.    For prescription refill requests, have your pharmacy contact our office and allow 72 hours.    Due to Covid, you will need to wear a mask upon entering the hospital. If you do not have a mask, a mask will be given to you at the Main Entrance upon arrival. For doctor visits, patients may have 1  support person age 32 or older with them. For treatment visits, patients can not have anyone with them due to social distancing guidelines and our immunocompromised population.

## 2023-10-01 DIAGNOSIS — H353221 Exudative age-related macular degeneration, left eye, with active choroidal neovascularization: Secondary | ICD-10-CM | POA: Diagnosis not present

## 2023-10-02 DIAGNOSIS — I6300 Cerebral infarction due to thrombosis of unspecified precerebral artery: Secondary | ICD-10-CM | POA: Diagnosis not present

## 2023-10-02 DIAGNOSIS — E785 Hyperlipidemia, unspecified: Secondary | ICD-10-CM | POA: Diagnosis not present

## 2023-10-02 DIAGNOSIS — Z79899 Other long term (current) drug therapy: Secondary | ICD-10-CM | POA: Diagnosis not present

## 2023-10-09 ENCOUNTER — Telehealth: Payer: Self-pay | Admitting: Cardiology

## 2023-10-09 DIAGNOSIS — E785 Hyperlipidemia, unspecified: Secondary | ICD-10-CM | POA: Diagnosis not present

## 2023-10-09 DIAGNOSIS — I5032 Chronic diastolic (congestive) heart failure: Secondary | ICD-10-CM | POA: Diagnosis not present

## 2023-10-09 DIAGNOSIS — Z8673 Personal history of transient ischemic attack (TIA), and cerebral infarction without residual deficits: Secondary | ICD-10-CM | POA: Diagnosis not present

## 2023-10-09 NOTE — Telephone Encounter (Signed)
Patient notified and verbalized understanding. Patient had no questions or concerns at this time.  

## 2023-10-09 NOTE — Telephone Encounter (Signed)
Patient is calling to follow up on heart monitor results. Please advise.

## 2023-10-10 DIAGNOSIS — H353211 Exudative age-related macular degeneration, right eye, with active choroidal neovascularization: Secondary | ICD-10-CM | POA: Diagnosis not present

## 2023-10-19 ENCOUNTER — Ambulatory Visit (INDEPENDENT_AMBULATORY_CARE_PROVIDER_SITE_OTHER): Payer: Medicare Other | Admitting: Orthopedic Surgery

## 2023-10-19 DIAGNOSIS — G8929 Other chronic pain: Secondary | ICD-10-CM | POA: Diagnosis not present

## 2023-10-19 DIAGNOSIS — M25512 Pain in left shoulder: Secondary | ICD-10-CM

## 2023-10-19 MED ORDER — METHYLPREDNISOLONE ACETATE 40 MG/ML IJ SUSP
40.0000 mg | Freq: Once | INTRAMUSCULAR | Status: AC
Start: 2023-10-19 — End: 2023-10-19
  Administered 2023-10-19: 40 mg via INTRA_ARTICULAR

## 2023-10-22 NOTE — Progress Notes (Signed)
Chief Complaint  Patient presents with   Shoulder Pain    Inj left shoulder     Encounter Diagnosis  Name Primary?   Chronic left shoulder pain Yes   Procedure note the subacromial injection shoulder left   Verbal consent was obtained to inject the  Left   Shoulder  Timeout was completed to confirm the injection site is a subacromial space of the  left  shoulder  Medication used Depo-Medrol 40 mg and lidocaine 1% 3 cc  Anesthesia was provided by ethyl chloride  The injection was performed in the left  posterior subacromial space. After pinning the skin with alcohol and anesthetized the skin with ethyl chloride the subacromial space was injected using a 20-gauge needle. There were no complications  Sterile dressing was applied.

## 2023-11-13 ENCOUNTER — Other Ambulatory Visit: Payer: Self-pay | Admitting: Physician Assistant

## 2023-11-13 ENCOUNTER — Telehealth: Payer: Self-pay | Admitting: Cardiology

## 2023-11-13 DIAGNOSIS — Z170 Estrogen receptor positive status [ER+]: Secondary | ICD-10-CM

## 2023-11-13 MED ORDER — EMPAGLIFLOZIN 10 MG PO TABS: 10.0000 mg | ORAL_TABLET | Freq: Every day | ORAL | 6 refills | Status: DC

## 2023-11-13 NOTE — Telephone Encounter (Signed)
 *  STAT* If patient is at the pharmacy, call can be transferred to refill team.   1. Which medications need to be refilled? (please list name of each medication and dose if known) empagliflozin  (JARDIANCE ) 10 MG TABS tablet    2. Would you like to learn more about the convenience, safety, & potential cost savings by using the [facility]?    3. Are you open to using the [facility] (Type [facility].    4. Which pharmacy/location (including street and city if local pharmacy) is medication to be sent to?  [id number] - [place], [place] - [address]     5. Do they need a 30 day or 90 day supply? 30 days   Pt is out of meds need refill today

## 2023-11-13 NOTE — Telephone Encounter (Signed)
 Refill request completed.

## 2023-11-28 DIAGNOSIS — H353221 Exudative age-related macular degeneration, left eye, with active choroidal neovascularization: Secondary | ICD-10-CM | POA: Diagnosis not present

## 2023-12-05 ENCOUNTER — Encounter: Payer: Self-pay | Admitting: Student

## 2023-12-05 ENCOUNTER — Ambulatory Visit: Payer: HMO | Attending: Student | Admitting: Student

## 2023-12-05 VITALS — BP 122/61 | HR 95 | Ht 64.0 in | Wt 230.0 lb

## 2023-12-05 DIAGNOSIS — I5032 Chronic diastolic (congestive) heart failure: Secondary | ICD-10-CM

## 2023-12-05 DIAGNOSIS — I10 Essential (primary) hypertension: Secondary | ICD-10-CM

## 2023-12-05 DIAGNOSIS — I890 Lymphedema, not elsewhere classified: Secondary | ICD-10-CM | POA: Diagnosis not present

## 2023-12-05 DIAGNOSIS — I272 Pulmonary hypertension, unspecified: Secondary | ICD-10-CM

## 2023-12-05 DIAGNOSIS — I350 Nonrheumatic aortic (valve) stenosis: Secondary | ICD-10-CM

## 2023-12-05 DIAGNOSIS — I259 Chronic ischemic heart disease, unspecified: Secondary | ICD-10-CM | POA: Diagnosis not present

## 2023-12-05 DIAGNOSIS — E785 Hyperlipidemia, unspecified: Secondary | ICD-10-CM | POA: Diagnosis not present

## 2023-12-05 NOTE — Patient Instructions (Signed)
Medication Instructions:  Your physician recommends that you continue on your current medications as directed. Please refer to the Current Medication list given to you today.   Labwork: None today  Testing/Procedures: None today  Follow-Up: 6 months  Any Other Special Instructions Will Be Listed Below (If Applicable).  If you need a refill on your cardiac medications before your next appointment, please call your pharmacy.

## 2023-12-05 NOTE — Progress Notes (Signed)
Cardiology Office Note    Date:  12/05/2023  ID:  [name], DOB 04-May-1941, MRN [id number] Cardiologist: Nona Dell, MD    History of Present Illness:    [name] is a 83 y.o. female with past medical history of pulmonary hypertension (group 2/3), presumed ischemic heart disease, chronic hypoxic respiratory failure, HTN, HLD, lymphedema, history of CVA and TIA, history of endometrial cancer (s/p hysterectomy) and aortic stenosis who presents to the office today for 40-month follow-up.  She was last examined by Dr. Diona Browner in 08/2023 and had recently been hospitalized for an acute CVA and did have a 30-day monitor in place. She denied any recent chest pain or palpitations at that time. Recent echocardiogram had shown mild AS and was recommended to continue to follow this. In regards to her pulmonary hypertension, medical therapy had previously been recommended with no plans for aggressive workup and she was continued on Jardiance and Lasix along with being started on Spironolactone 12.5 mg daily. Her monitor resulted in the interim and showed predominantly normal sinus rhythm with an average heart rate of 67 bpm. She did have rare PAC's and PVC's but no evidence of atrial fibrillation.  In talking with the patient and her daughter today, she reports things have remained stable since her last office visit. She continues to work at [employer] on [employer] 5 days a week. She enjoys doing this and being out of the house. She has baseline dyspnea but only uses supplemental oxygen as needed and this is typically mostly at night. No specific orthopnea or PND.  She does have chronic lower extremity edema. She has a compression machine but rarely uses this. She also consumes a high sodium diet as she frequently consumes takeout or TV dinners as she lives by herself since her husband passed away. Denies any palpitations or episodes of exertional chest pain. Does report having 2 vaginal yeast  infections within the past year and questions if this is due to [place].   Studies Reviewed:   EKG: EKG is not ordered today.  Echocardiogram: 08/2023 IMPRESSIONS     1. Left ventricular ejection fraction, by estimation, is >75%. The left  ventricle has hyperdynamic function. The left ventricle has no regional  wall motion abnormalities. There is moderate asymmetric left ventricular  hypertrophy of the septal segment.  Left ventricular diastolic parameters are consistent with Grade I  diastolic dysfunction (impaired relaxation).   2. Right ventricular systolic function is normal. The right ventricular  size is normal. There is moderately elevated pulmonary artery systolic  pressure. The estimated right ventricular systolic pressure is 46.7 mmHg.   3. Left atrial size was moderately dilated.   4. The mitral valve is degenerative. Mild mitral valve regurgitation.  Moderate mitral annular calcification.   5. The aortic valve is tricuspid. There is mild calcification of the  aortic valve. Aortic valve regurgitation is not visualized. Moderate  aortic valve sclerosis/calcification to mild stenosis is present. Aortic  valve mean gradient measures 12.5 mmHg.  Aortic valve Vmax measures 2.31 m/s.   6. Aortic dilatation noted. There is mild dilatation of the ascending  aorta, measuring 39 mm.   7. The inferior vena cava is normal in size with <50% respiratory  variability, suggesting right atrial pressure of 8 mmHg.   Comparison(s): Prior images reviewed side by side. LVEF hyperdynamic >  75%. Moderately elevated estimated RVSP. Mild mitral regurgitation.  Moderately sclerotic to mildly stenotic aortic valve.   Event Monitor: 08/2023 [employer]  30-day event recorder reviewed.   Predominant rhythm is sinus with IVCD, heart rate ranging from 50 bpm up to 99 bpm and average heart rate 67 bpm. There were rare PACs and PVCs representing less than 1% total beats. No atrial  fibrillation. No pauses or high degree heart block.   Physical Exam:   VS:  BP 122/61   Pulse 95   Ht 5\' 4"  (1.626 m)   Wt 230 lb (104.3 kg)   SpO2 92%   BMI 39.48 kg/m    Wt Readings from Last 3 Encounters:  12/05/23 230 lb (104.3 kg)  09/13/23 230 lb (104.3 kg)  09/04/23 230 lb 9.6 oz (104.6 kg)     GEN: Pleasant, elderly female appearing in no acute distress NECK: No JVD; No carotid bruits CARDIAC: RRR, 2/6 SEM along RUSB.  RESPIRATORY:  Clear to auscultation without rales, wheezing or rhonchi  ABDOMEN: Appears non-distended. No obvious abdominal masses. EXTREMITIES: No clubbing or cyanosis. Chronic appearing lymphedema.  Distal pedal pulses are 2+ bilaterally.   Assessment and Plan:   1. Chronic HFpEF/Pulmonary HTN - Her pulmonary hypertension is felt to be group 2/3 based on workup and medical therapy has been pursued given her age. Symptoms have overall been stable and she only uses supplemental oxygen as needed at night. Given the stability of her symptoms, will continue current medical therapy with Jardiance 10 mg daily, Spironolactone 12.5 mg daily and Lasix 40 mg daily. I encouraged her to make [place] aware if she has another vaginal yeast infection as Jardiance would need to be discontinued.  2.  Lymphedema - This has been a chronic issue for the patient. Her daughter does have compression pumps for her to use in the evening and she was encouraged to use these if able. Also reviewed the importance of limiting her sodium intake. Remains on Lasix 40 mg daily and Spironolactone 12.5 mg daily. Creatinine was stable at 0.49 when checked in 09/2023.  3. Presumed ischemic heart disease - Prior NST in 2022 was intermediate-risk as it showed an area of ischemia and medical management was pursued at that time. She has baseline dyspnea but denies any chest pain. Continue with risk factor modification. She remains on ASA 81 mg daily, Atorvastatin 40mg  daily, Imdur 15mg  daily and Coreg  6.25 mg BID.   4. HLD - LDL was at 61 in 09/2023 by review of [facility] DXA. Continue Atorvastatin 40mg  daily.   5. HTN - BP is well-controlled at 122/61 during today's visit. Continue current medical therapy.   6. Aortic Stenosis - This was mild by most recent echocardiogram. Continue to follow and would anticipate repeat imaging in 1-2 years for reassessment.    Signed, Ellsworth Lennox, PA-C

## 2023-12-10 DIAGNOSIS — H353211 Exudative age-related macular degeneration, right eye, with active choroidal neovascularization: Secondary | ICD-10-CM | POA: Diagnosis not present

## 2023-12-14 ENCOUNTER — Ambulatory Visit (INDEPENDENT_AMBULATORY_CARE_PROVIDER_SITE_OTHER): Payer: HMO | Admitting: Orthopedic Surgery

## 2023-12-14 ENCOUNTER — Ambulatory Visit: Payer: HMO | Admitting: Orthopedic Surgery

## 2023-12-14 DIAGNOSIS — G8929 Other chronic pain: Secondary | ICD-10-CM

## 2023-12-14 DIAGNOSIS — M25561 Pain in right knee: Secondary | ICD-10-CM

## 2023-12-14 MED ORDER — METHYLPREDNISOLONE ACETATE 40 MG/ML IJ SUSP
40.0000 mg | Freq: Once | INTRAMUSCULAR | Status: AC
Start: 2023-12-14 — End: 2023-12-14
  Administered 2023-12-14: 40 mg via INTRA_ARTICULAR

## 2023-12-14 NOTE — Patient Instructions (Signed)
1 week right shoulder

## 2023-12-14 NOTE — Addendum Note (Signed)
 Addended by: Georgann Kim on: 12/14/2023 11:10 AM   Modules accepted: Orders

## 2023-12-14 NOTE — Progress Notes (Signed)
   There were no vitals taken for this visit.  There is no height or weight on file to calculate BMI.  Chief Complaint  Patient presents with   Knee Pain    Patient in today for injection in the right knee     No diagnosis found.  DOI/DOS/ Date: 2 weeks  Worse

## 2023-12-14 NOTE — Progress Notes (Signed)
   Procedure note for injection   Chief Complaint  Patient presents with   Knee Pain    Patient in today for injection in the right knee      Encounter Diagnosis  Name Primary?   Chronic pain of right knee Yes        The patient has consented for injection of the Joint: Right knee  Medication: Depo-Medrol  40 mg and lidocaine  1%  Time out completed: Yes  The site of injection was cleaned with alcohol and ethyl chloride.  The injection was given without any complications appropriate precautions were given.

## 2023-12-19 ENCOUNTER — Inpatient Hospital Stay: Payer: Self-pay

## 2023-12-21 ENCOUNTER — Ambulatory Visit (INDEPENDENT_AMBULATORY_CARE_PROVIDER_SITE_OTHER): Payer: HMO | Admitting: Orthopedic Surgery

## 2023-12-21 DIAGNOSIS — G8929 Other chronic pain: Secondary | ICD-10-CM | POA: Diagnosis not present

## 2023-12-21 DIAGNOSIS — M25511 Pain in right shoulder: Secondary | ICD-10-CM

## 2023-12-21 MED ORDER — METHYLPREDNISOLONE ACETATE 40 MG/ML IJ SUSP
40.0000 mg | Freq: Once | INTRAMUSCULAR | Status: AC
Start: 2023-12-21 — End: 2023-12-21
  Administered 2023-12-21: 40 mg via INTRA_ARTICULAR

## 2023-12-21 NOTE — Progress Notes (Signed)
Chief Complaint  Patient presents with   Injections    R shoulder    Requested injection   Encounter Diagnosis  Name Primary?   Chronic right shoulder pain Yes    Procedure note the subacromial injection shoulder RIGHT    Verbal consent was obtained to inject the  RIGHT   Shoulder  Timeout was completed to confirm the injection site is a subacromial space of the  RIGHT  shoulder   Medication used Depo-Medrol 40 mg and lidocaine 1% 3 cc  Anesthesia was provided by ethyl chloride  The injection was performed in the RIGHT  posterior subacromial space. After pinning the skin with alcohol and anesthetized the skin with ethyl chloride the subacromial space was injected using a 20-gauge needle. There were no complications  Sterile dressing was applied.

## 2023-12-26 ENCOUNTER — Inpatient Hospital Stay: Payer: Self-pay | Admitting: Physician Assistant

## 2023-12-28 ENCOUNTER — Observation Stay (HOSPITAL_COMMUNITY)
Admission: EM | Admit: 2023-12-28 | Discharge: 2023-12-29 | Disposition: A | Discharge status: Home / Self Care | Payer: HMO | Attending: Emergency Medicine | Admitting: Emergency Medicine

## 2023-12-28 ENCOUNTER — Other Ambulatory Visit: Payer: Self-pay

## 2023-12-28 ENCOUNTER — Emergency Department (HOSPITAL_COMMUNITY): Payer: HMO

## 2023-12-28 ENCOUNTER — Encounter (HOSPITAL_COMMUNITY): Payer: Self-pay | Admitting: *Deleted

## 2023-12-28 DIAGNOSIS — Z170 Estrogen receptor positive status [ER+]: Secondary | ICD-10-CM

## 2023-12-28 DIAGNOSIS — C220 Liver cell carcinoma: Secondary | ICD-10-CM | POA: Insufficient documentation

## 2023-12-28 DIAGNOSIS — I5032 Chronic diastolic (congestive) heart failure: Secondary | ICD-10-CM | POA: Diagnosis not present

## 2023-12-28 DIAGNOSIS — D7589 Other specified diseases of blood and blood-forming organs: Secondary | ICD-10-CM | POA: Diagnosis not present

## 2023-12-28 DIAGNOSIS — Z79899 Other long term (current) drug therapy: Secondary | ICD-10-CM | POA: Insufficient documentation

## 2023-12-28 DIAGNOSIS — J9611 Chronic respiratory failure with hypoxia: Secondary | ICD-10-CM | POA: Diagnosis not present

## 2023-12-28 DIAGNOSIS — I110 Hypertensive heart disease with heart failure: Secondary | ICD-10-CM | POA: Diagnosis not present

## 2023-12-28 DIAGNOSIS — K56609 Unspecified intestinal obstruction, unspecified as to partial versus complete obstruction: Secondary | ICD-10-CM

## 2023-12-28 DIAGNOSIS — C50412 Malignant neoplasm of upper-outer quadrant of left female breast: Secondary | ICD-10-CM | POA: Diagnosis not present

## 2023-12-28 DIAGNOSIS — K469 Unspecified abdominal hernia without obstruction or gangrene: Secondary | ICD-10-CM | POA: Diagnosis present

## 2023-12-28 DIAGNOSIS — D72829 Elevated white blood cell count, unspecified: Secondary | ICD-10-CM | POA: Insufficient documentation

## 2023-12-28 DIAGNOSIS — K436 Other and unspecified ventral hernia with obstruction, without gangrene: Secondary | ICD-10-CM | POA: Diagnosis not present

## 2023-12-28 DIAGNOSIS — R10811 Right upper quadrant abdominal tenderness: Secondary | ICD-10-CM | POA: Diagnosis not present

## 2023-12-28 DIAGNOSIS — K439 Ventral hernia without obstruction or gangrene: Secondary | ICD-10-CM | POA: Diagnosis not present

## 2023-12-28 DIAGNOSIS — Z7901 Long term (current) use of anticoagulants: Secondary | ICD-10-CM | POA: Diagnosis not present

## 2023-12-28 DIAGNOSIS — Z6841 Body Mass Index (BMI) 40.0 and over, adult: Secondary | ICD-10-CM | POA: Diagnosis not present

## 2023-12-28 DIAGNOSIS — Z8673 Personal history of transient ischemic attack (TIA), and cerebral infarction without residual deficits: Secondary | ICD-10-CM | POA: Diagnosis not present

## 2023-12-28 DIAGNOSIS — K219 Gastro-esophageal reflux disease without esophagitis: Secondary | ICD-10-CM | POA: Diagnosis not present

## 2023-12-28 DIAGNOSIS — R718 Other abnormality of red blood cells: Secondary | ICD-10-CM | POA: Insufficient documentation

## 2023-12-28 DIAGNOSIS — D45 Polycythemia vera: Secondary | ICD-10-CM | POA: Insufficient documentation

## 2023-12-28 DIAGNOSIS — I10 Essential (primary) hypertension: Secondary | ICD-10-CM | POA: Diagnosis present

## 2023-12-28 DIAGNOSIS — R252 Cramp and spasm: Secondary | ICD-10-CM | POA: Insufficient documentation

## 2023-12-28 DIAGNOSIS — E66813 Obesity, class 3: Secondary | ICD-10-CM | POA: Diagnosis not present

## 2023-12-28 DIAGNOSIS — K450 Other specified abdominal hernia with obstruction, without gangrene: Secondary | ICD-10-CM | POA: Insufficient documentation

## 2023-12-28 DIAGNOSIS — K5730 Diverticulosis of large intestine without perforation or abscess without bleeding: Secondary | ICD-10-CM | POA: Insufficient documentation

## 2023-12-28 DIAGNOSIS — K429 Umbilical hernia without obstruction or gangrene: Secondary | ICD-10-CM | POA: Diagnosis not present

## 2023-12-28 DIAGNOSIS — Z9889 Other specified postprocedural states: Secondary | ICD-10-CM | POA: Diagnosis not present

## 2023-12-28 DIAGNOSIS — R109 Unspecified abdominal pain: Secondary | ICD-10-CM | POA: Diagnosis present

## 2023-12-28 DIAGNOSIS — K449 Diaphragmatic hernia without obstruction or gangrene: Secondary | ICD-10-CM | POA: Diagnosis not present

## 2023-12-28 LAB — COMPREHENSIVE METABOLIC PANEL
ALT: 14 U/L (ref 0–44)
AST: 16 U/L (ref 15–41)
Albumin: 4 g/dL (ref 3.5–5.0)
Alkaline Phosphatase: 83 U/L (ref 38–126)
Anion gap: 10 (ref 5–15)
BUN: 15 mg/dL (ref 8–23)
CO2: 29 mmol/L (ref 22–32)
Calcium: 9.7 mg/dL (ref 8.9–10.3)
Chloride: 100 mmol/L (ref 98–111)
Creatinine, Ser: 0.61 mg/dL (ref 0.44–1.00)
GFR, Estimated: >60 mL/min (ref >60)
Glucose, Bld: 103 mg/dL — ABNORMAL HIGH (ref 70–99)
Potassium: 4 mmol/L (ref 3.5–5.1)
Sodium: 139 mmol/L (ref 135–145)
Total Bilirubin: 1.3 mg/dL — ABNORMAL HIGH (ref 0.0–1.2)
Total Protein: 7.2 g/dL (ref 6.5–8.1)

## 2023-12-28 LAB — CBC
HCT: 42.6 % (ref 36.0–46.0)
Hemoglobin: 15.1 g/dL — ABNORMAL HIGH (ref 12.0–15.0)
MCH: 44 pg — ABNORMAL HIGH (ref 26.0–34.0)
MCHC: 35.4 g/dL (ref 30.0–36.0)
MCV: 124.2 fL — ABNORMAL HIGH (ref 80.0–100.0)
Platelets: 203 10*3/uL (ref 150–400)
RBC: 3.43 MIL/uL — ABNORMAL LOW (ref 3.87–5.11)
RDW: 12.8 % (ref 11.5–15.5)
WBC: 6.8 10*3/uL (ref 4.0–10.5)
nRBC: 0 % (ref 0.0–0.2)

## 2023-12-28 LAB — URINALYSIS, ROUTINE W REFLEX MICROSCOPIC
Bacteria, UA: NONE SEEN
Bilirubin Urine: NEGATIVE
Glucose, UA: >=500 mg/dL — AB
Hgb urine dipstick: NEGATIVE
Ketones, ur: NEGATIVE mg/dL
Leukocytes,Ua: NEGATIVE
Nitrite: NEGATIVE
Protein, ur: NEGATIVE mg/dL
Specific Gravity, Urine: 1.014 (ref 1.005–1.030)
pH: 7 (ref 5.0–8.0)

## 2023-12-28 LAB — TYPE AND SCREEN
ABO/RH(D): O POS
Antibody Screen: NEGATIVE

## 2023-12-28 LAB — LIPASE, BLOOD: Lipase: 40 U/L (ref 11–51)

## 2023-12-28 LAB — PROTIME-INR
INR: 1.1 (ref 0.8–1.2)
Prothrombin Time: 14 s (ref 11.4–15.2)

## 2023-12-28 MED ORDER — PANTOPRAZOLE SODIUM 40 MG PO TBEC
40.0000 mg | DELAYED_RELEASE_TABLET | Freq: Every day | ORAL | Status: DC
Start: 2023-12-29 — End: 2023-12-29
  Administered 2023-12-29: 40 mg via ORAL
  Filled 2023-12-28: qty 1

## 2023-12-28 MED ORDER — SODIUM CHLORIDE 0.9 % IV BOLUS
500.0000 mL | Freq: Once | INTRAVENOUS | Status: AC
  Administered 2023-12-28: 500 mL via INTRAVENOUS

## 2023-12-28 MED ORDER — ONDANSETRON HCL 4 MG/2ML IJ SOLN
4.0000 mg | Freq: Four times a day (QID) | INTRAMUSCULAR | Status: DC | PRN
Start: 2023-12-28 — End: 2023-12-29

## 2023-12-28 MED ORDER — ASPIRIN 81 MG PO TBEC
81.0000 mg | DELAYED_RELEASE_TABLET | Freq: Every day | ORAL | Status: DC
  Administered 2023-12-29: 81 mg via ORAL
  Filled 2023-12-28: qty 1

## 2023-12-28 MED ORDER — DILTIAZEM HCL ER COATED BEADS 180 MG PO CP24
180.0000 mg | ORAL_CAPSULE | Freq: Every day | ORAL | Status: DC
  Administered 2023-12-28 – 2023-12-29 (×2): 180 mg via ORAL
  Filled 2023-12-28 (×2): qty 1

## 2023-12-28 MED ORDER — EMPAGLIFLOZIN 10 MG PO TABS: 10.0000 mg | ORAL_TABLET | Freq: Every day | ORAL | Status: DC

## 2023-12-28 MED ORDER — BISACODYL 10 MG RE SUPP
10.0000 mg | Freq: Every day | RECTAL | Status: DC
  Administered 2023-12-28: 10 mg via RECTAL
  Filled 2023-12-28 (×2): qty 1

## 2023-12-28 MED ORDER — ATORVASTATIN CALCIUM 40 MG PO TABS
40.0000 mg | ORAL_TABLET | Freq: Every day | ORAL | Status: DC
  Administered 2023-12-29: 40 mg via ORAL
  Filled 2023-12-28: qty 1

## 2023-12-28 MED ORDER — SODIUM CHLORIDE 0.9 % IV SOLN: Freq: Once | INTRAVENOUS | Status: AC

## 2023-12-28 MED ORDER — ENOXAPARIN SODIUM 60 MG/0.6ML IJ SOSY
0.5000 mg/kg | PREFILLED_SYRINGE | INTRAMUSCULAR | Status: DC
  Administered 2023-12-28: 52.5 mg via SUBCUTANEOUS
  Filled 2023-12-28: qty 0.6

## 2023-12-28 MED ORDER — ONDANSETRON HCL 4 MG PO TABS: 4.0000 mg | ORAL_TABLET | Freq: Four times a day (QID) | ORAL | Status: DC | PRN

## 2023-12-28 MED ORDER — SENNOSIDES-DOCUSATE SODIUM 8.6-50 MG PO TABS
1.0000 | ORAL_TABLET | Freq: Two times a day (BID) | ORAL | Status: DC
  Administered 2023-12-28 – 2023-12-29 (×2): 1 via ORAL
  Filled 2023-12-28 (×2): qty 1

## 2023-12-28 MED ORDER — FENTANYL CITRATE PF 50 MCG/ML IJ SOSY
50.0000 ug | PREFILLED_SYRINGE | Freq: Once | INTRAMUSCULAR | Status: AC
  Administered 2023-12-28: 50 ug via INTRAVENOUS
  Filled 2023-12-28 (×2): qty 1

## 2023-12-28 MED ORDER — HYDROXYUREA 500 MG PO CAPS
1500.0000 mg | ORAL_CAPSULE | ORAL | Status: DC
Start: 2023-12-29 — End: 2023-12-29
  Administered 2023-12-29: 1500 mg via ORAL
  Filled 2023-12-28: qty 3

## 2023-12-28 MED ORDER — IOHEXOL 300 MG/ML  SOLN
100.0000 mL | Freq: Once | INTRAMUSCULAR | Status: AC | PRN
  Administered 2023-12-28: 100 mL via INTRAVENOUS

## 2023-12-28 MED ORDER — POLYETHYLENE GLYCOL 3350 17 G PO PACK
17.0000 g | PACK | Freq: Every day | ORAL | Status: DC
  Administered 2023-12-28 – 2023-12-29 (×2): 17 g via ORAL
  Filled 2023-12-28 (×2): qty 1

## 2023-12-28 MED ORDER — ENOXAPARIN SODIUM 40 MG/0.4ML IJ SOSY: 40.0000 mg | PREFILLED_SYRINGE | INTRAMUSCULAR | Status: DC

## 2023-12-28 MED ORDER — ACETAMINOPHEN 650 MG RE SUPP: 650.0000 mg | Freq: Four times a day (QID) | RECTAL | Status: DC | PRN

## 2023-12-28 MED ORDER — FENTANYL CITRATE PF 50 MCG/ML IJ SOSY: 12.5000 ug | PREFILLED_SYRINGE | INTRAMUSCULAR | Status: DC | PRN

## 2023-12-28 MED ORDER — ONDANSETRON HCL 4 MG/2ML IJ SOLN
4.0000 mg | Freq: Once | INTRAMUSCULAR | Status: AC
  Administered 2023-12-28: 4 mg via INTRAVENOUS
  Filled 2023-12-28: qty 2

## 2023-12-28 MED ORDER — HYDROXYUREA 500 MG PO CAPS: 1000.0000 mg | ORAL_CAPSULE | ORAL | Status: DC

## 2023-12-28 MED ORDER — LETROZOLE 2.5 MG PO TABS
2.5000 mg | ORAL_TABLET | Freq: Every day | ORAL | Status: DC
  Administered 2023-12-29: 2.5 mg via ORAL
  Filled 2023-12-28 (×3): qty 1

## 2023-12-28 MED ORDER — CLONIDINE HCL 0.1 MG PO TABS
0.1500 mg | ORAL_TABLET | Freq: Two times a day (BID) | ORAL | Status: DC
  Administered 2023-12-29 (×2): 0.15 mg via ORAL
  Filled 2023-12-28 (×2): qty 2

## 2023-12-28 MED ORDER — FUROSEMIDE 40 MG PO TABS
40.0000 mg | ORAL_TABLET | Freq: Every day | ORAL | Status: DC
  Administered 2023-12-29: 40 mg via ORAL
  Filled 2023-12-28: qty 1

## 2023-12-28 MED ORDER — ACETAMINOPHEN 325 MG PO TABS: 650.0000 mg | ORAL_TABLET | Freq: Four times a day (QID) | ORAL | Status: DC | PRN

## 2023-12-28 NOTE — Consult Note (Signed)
 [facility] Consult  Reason for Consult: Left spigelian hernia causing small bowel obstruction Referring Physician: Dr. Charm Barges  Chief Complaint   Abdominal Pain     HPI: [name] is a 83 y.o. female who presents with a 2 to 3-day history of worsening left lower quadrant abdominal pain.  The pain started for her earlier this week, but starting today, the pain started to [place] around her left hip which prompted her to be evaluated by her primary care doctor.  Her primary care doctor recommended that she present to the emergency department.  In the emergency department, she began to get nauseous with multiple episodes of emesis.  She denies ever having pain like this in the past.  She confirms that she is still passing flatus and her last bowel movement was yesterday morning.  She states that it is normal for her to not have daily bowel movements.  Her past medical history is significant for CHF, hypertension, and GERD.  Her surgical history is significant for an abdominal hysterectomy with salpingo-oophorectomy secondary to uterine cancer, and breast lumpectomy and sentinel lymph node biopsy for breast cancer.  In the emergency department, she was noted to be hemodynamically stable.  Her blood work was unremarkable.  She subsequently underwent a CT of the abdomen and pelvis which demonstrated a small bowel obstruction due to a left lower quadrant spigelian hernia without any signs of bowel ischemia.  Upon speaking to the ED physician, he went back and attempted to reduce the hernia, and felt that a mass in the left lower quadrant had been reduced, though he could not palpate a discrete fascial defect.  After reduction of this mass, the patient feels significantly better and confirms passing more flatus.  Past Medical History:  Diagnosis Date   Arthritis    Chronic diastolic heart failure (HCC)    Essential hypertension    GERD (gastroesophageal reflux disease)     Gout    Hiatal hernia    History of endometrial cancer    Sp hysterectomy 2012   Ischemic colitis (HCC) 2016   Myeloproliferative disorder, JAK-2 positive    Obesity    Pneumonia    Polycythemia vera(238.4)    TIA (transient ischemic attack)     Past Surgical History:  Procedure Laterality Date   ABDOMINAL HYSTERECTOMY  09/12/2011   Procedure: HYSTERECTOMY ABDOMINAL;  Surgeon: Laurette Schimke, MD;  Location: [facility];  Service: Gynecology;  Laterality: N/A;  Total Abdominal Hysterectomy, Bilateral Salpingo Oophorectomy   BREAST LUMPECTOMY Left 07/10/2017   LEFT BREAST LUMPECTOMY WITH RADIOACTIVE SEED AND SENTINEL LYMPH NODE BIOPSY    BREAST LUMPECTOMY WITH RADIOACTIVE SEED AND SENTINEL LYMPH NODE BIOPSY Left 07/10/2017   Procedure: LEFT BREAST LUMPECTOMY WITH RADIOACTIVE SEED AND SENTINEL LYMPH NODE BIOPSY;  Surgeon: Emelia Loron, MD;  Location: [facility] OR;  Service: General;  Laterality: Left;   COLONOSCOPY  07/05/2012   Procedure: COLONOSCOPY;  Surgeon: Malissa Hippo, MD;  Location: [facility];  Service: Endoscopy;  Laterality: N/A;  730   EYE SURGERY     bilateral 8 - 12 yrs ago   FLEXIBLE SIGMOIDOSCOPY N/A 04/03/2015   Procedure: FLEXIBLE SIGMOIDOSCOPY;  Surgeon: Malissa Hippo, MD;  Location: [facility];  Service: Endoscopy;  Laterality: N/A;   HYSTEROSCOPY WITH D & C  08/15/2011   Procedure: DILATATION AND CURETTAGE (D&C) /HYSTEROSCOPY;  Surgeon: Tilda Burrow, MD;  Location: [facility];  Service: Gynecology;  Laterality: N/A;  With Suction Curette  JOINT REPLACEMENT     left knee replaced 2014   KNEE ARTHROPLASTY Left 03/15/2015   Procedure: COMPUTER ASSISTED TOTAL KNEE ARTHROPLASTY;  Surgeon: Eldred Manges, MD;  Location: [facility] OR;  Service: Orthopedics;  Laterality: Left;   SALPINGOOPHORECTOMY  09/12/2011   Procedure: SALPINGO OOPHERECTOMY;  Surgeon: Laurette Schimke, MD;  Location: [facility];  Service: Gynecology;  Laterality: Bilateral;    Family History  Problem Relation Age of  Onset   Hypertension Mother    Lung cancer Father    Arthritis/Rheumatoid Sister    Anesthesia problems Neg Hx    Hypotension Neg Hx    Malignant hyperthermia Neg Hx    Pseudochol deficiency Neg Hx     Social History   Tobacco Use   Smoking status: Never    Passive exposure: Never   Smokeless tobacco: Never  Vaping Use   Vaping status: Never Used  Substance Use Topics   Alcohol use: No   Drug use: No    Medications: I have reviewed the patient's current medications.  Allergies  Allergen Reactions   Carbamazepine Hives and Other (See Comments)    headache   Sulfa Antibiotics     Rash     ROS:  Pertinent items are noted in HPI.  Blood pressure (!) 160/78, pulse 78, temperature 98.1 F (36.7 C), temperature source Oral, resp. rate 17, height 5\' 4"  (1.626 m), weight 106.6 kg, SpO2 (!) 87%. Physical Exam Vitals reviewed.  Constitutional:      Appearance: She is well-developed.  HENT:     Head: Normocephalic and atraumatic.  Eyes:     Extraocular Movements: Extraocular movements intact.     Pupils: Pupils are equal, round, and reactive to light.  Cardiovascular:     Rate and Rhythm: Normal rate.  Pulmonary:     Effort: Pulmonary effort is normal.  Abdominal:     Comments: Abdomen soft, nondistended, no percussion tenderness, nontender to palpation; no rigidity, guarding, rebound tenderness; left lower quadrant without any palpable masses, no discrete hernia defects able to be palpated  Skin:    General: Skin is warm and dry.  Neurological:     General: No focal deficit present.     Mental Status: She is alert and oriented to person, place, and time.  Psychiatric:        Mood and Affect: Mood normal.        Behavior: Behavior normal.     Results: Results for orders placed or performed during the hospital encounter of 12/28/23 (from the past 48 hours)  Lipase, blood     Status: None   Collection Time: 12/28/23 10:49 AM  Result Value Ref Range   Lipase 40  11 - 51 U/L    Comment: Performed at [facility], [address], [place], [place] [address]  Comprehensive metabolic panel     Status: Abnormal   Collection Time: 12/28/23 10:49 AM  Result Value Ref Range   Sodium 139 135 - 145 mmol/L   Potassium 4.0 3.5 - 5.1 mmol/L   Chloride 100 98 - 111 mmol/L   CO2 29 22 - 32 mmol/L   Glucose, Bld 103 (H) 70 - 99 mg/dL    Comment: Glucose reference range applies only to samples taken after fasting for at least 8 hours.   BUN 15 8 - 23 mg/dL   Creatinine, Ser 1.91 0.44 - 1.00 mg/dL   Calcium 9.7 8.9 - 47.8 mg/dL   Total Protein 7.2 6.5 - 8.1 g/dL  Albumin 4.0 3.5 - 5.0 g/dL   AST 16 15 - 41 U/L   ALT 14 0 - 44 U/L   Alkaline Phosphatase 83 38 - 126 U/L   Total Bilirubin 1.3 (H) 0.0 - 1.2 mg/dL   GFR, Estimated >96 >29 mL/min    Comment: (NOTE) Calculated using the CKD-EPI Creatinine Equation (2021)    Anion gap 10 5 - 15    Comment: Performed at [facility], [address] [place], [place] [address]  CBC     Status: Abnormal   Collection Time: 12/28/23 10:49 AM  Result Value Ref Range   WBC 6.8 4.0 - 10.5 K/uL   RBC 3.43 (L) 3.87 - 5.11 MIL/uL   Hemoglobin 15.1 (H) 12.0 - 15.0 g/dL   HCT 32.4 40.1 - 02.7 %   MCV 124.2 (H) 80.0 - 100.0 fL   MCH 44.0 (H) 26.0 - 34.0 pg   MCHC 35.4 30.0 - 36.0 g/dL   RDW 25.3 66.4 - 40.3 %   Platelets 203 150 - 400 K/uL   nRBC 0.0 0.0 - 0.2 %    Comment: Performed at [facility], [address], [place], [place] [address]  Protime-INR     Status: None   Collection Time: 12/28/23 10:49 AM  Result Value Ref Range   Prothrombin Time 14.0 11.4 - 15.2 seconds   INR 1.1 0.8 - 1.2    Comment: (NOTE) INR goal varies based on device and disease states. Performed at [facility], [address] [place], [place] [address]   Urinalysis, Routine w reflex microscopic -Urine, Clean Catch     Status: Abnormal   Collection Time: 12/28/23 12:19 PM  Result Value Ref Range   Color, Urine YELLOW YELLOW    APPearance CLEAR CLEAR   Specific Gravity, Urine 1.014 1.005 - 1.030   pH 7.0 5.0 - 8.0   Glucose, UA >=500 (A) NEGATIVE mg/dL   Hgb urine dipstick NEGATIVE NEGATIVE   Bilirubin Urine NEGATIVE NEGATIVE   Ketones, ur NEGATIVE NEGATIVE mg/dL   Protein, ur NEGATIVE NEGATIVE mg/dL   Nitrite NEGATIVE NEGATIVE   Leukocytes,Ua NEGATIVE NEGATIVE   RBC / HPF 0-5 0 - 5 RBC/hpf   WBC, UA 0-5 0 - 5 WBC/hpf   Bacteria, UA NONE SEEN NONE SEEN   Squamous Epithelial / HPF 0-5 0 - 5 /HPF   Mucus PRESENT     Comment: Performed at [facility], [address], [place], [place] [address]  Type and screen [facility]     Status: None   Collection Time: 12/28/23  3:12 PM  Result Value Ref Range   ABO/RH(D) O POS    Antibody Screen NEG    Sample Expiration      12/31/2023,2359 Performed at [facility], [address], [place], [place] [address]     CT ABDOMEN PELVIS WO CONTRAST Addendum Date: 12/28/2023 ADDENDUM REPORT: 12/28/2023 18:58 ADDENDUM: Upon further review, the hernia has been reduced from CT scan same day acquired at 12:51 p.m. There is now only fluid within the hernia sac. No bowel within the hernia sac. There is ovoid periphery calcified focus of fat necrosis within the hernia sac . Findings conveyed toCATHERINE Precious Gilchrest on 12/28/2023  at18:57. Electronically Signed   By: Genevive Bi M.D.   On: 12/28/2023 18:58   Result Date: 12/28/2023 CLINICAL DATA:  Hiatal hernia suspected EXAM: CT ABDOMEN AND PELVIS WITHOUT CONTRAST TECHNIQUE: Multidetector CT imaging of the abdomen and pelvis was performed following the standard protocol  without IV contrast. RADIATION DOSE REDUCTION: This exam was performed according to the departmental dose-optimization program which includes automated exposure control, adjustment of the mA and/or kV according to patient size and/or use of iterative reconstruction technique. COMPARISON:  None Available. FINDINGS: Lower chest: Lung bases are clear.  Hepatobiliary: No focal hepatic lesion. Normal gallbladder. No biliary duct dilatation. Common bile duct is normal. Pancreas: Pancreas is normal. No ductal dilatation. No pancreatic inflammation. Spleen: Normal spleen Adrenals/urinary tract: Adrenal glands and kidneys are normal. Bilateral simple fluid attenuation renal cysts. No follow-up recommended. The ureters and bladder normal. Stomach/Bowel: Moderate size hiatal hernia. Approximately 1/3 third of the stomach above the hemidiaphragms posterior the heart. Sliding-type hiatal hernia. Duodenum normal. No evidence of bowel obstruction. Mild haziness within the small bowel mesentery of the LEFT lower quadrant (image 58/2). There is a lateral abdominal wall hernia along the lateral margin of the LEFT rectus muscle in the LEFT lower quadrant. This is best seen on sagittal series (image [id number] series 5). Loop of small bowel is trapped within this hernia with small amount of fluid in the hernia sac. Vascular/Lymphatic: Abdominal aorta is normal caliber with atherosclerotic calcification. There is no retroperitoneal or periportal lymphadenopathy. No pelvic lymphadenopathy. Reproductive: Post hysterectomy.  Adnexa unremarkable Other: No free fluid. Musculoskeletal: No aggressive osseous lesion. IMPRESSION: Lateral abdominal wall hernia in the LEFT lower quadrant contains a loop of entrapped small bowel with potential obstruction. Recommend surgical consultation. Electronically Signed: By: Genevive Bi M.D. On: 12/28/2023 18:41   CT ABDOMEN PELVIS W CONTRAST Result Date: 12/28/2023 CLINICAL DATA:  Left lower quadrant and flank pain. Tenderness to palpation. EXAM: CT ABDOMEN AND PELVIS WITH CONTRAST TECHNIQUE: Multidetector CT imaging of the abdomen and pelvis was performed using the standard protocol following bolus administration of intravenous contrast. RADIATION DOSE REDUCTION: This exam was performed according to the departmental dose-optimization program which  includes automated exposure control, adjustment of the mA and/or kV according to patient size and/or use of iterative reconstruction technique. CONTRAST:  OMNIPAQUE IOHEXOL 300 MG/ML  SOLN COMPARISON:  None Available. FINDINGS: Lower Chest: No acute findings. Elevation of right hemidiaphragm noted. Hepatobiliary: No suspicious hepatic masses identified. Prior cholecystectomy. No evidence of biliary obstruction. Pancreas:  No mass or inflammatory changes. Spleen: Within normal limits in size and appearance. Adrenals/Urinary Tract: No suspicious masses identified. No evidence of ureteral calculi or hydronephrosis. Stomach/Bowel: Left lower quadrant spigelian hernia is seen containing a dilated small bowel loop and small amount of fluid. Moderate dilatation of proximal small bowel loops is seen, consistent with small-bowel obstruction. No evidence of bowel wall thickening or pneumatosis. A moderate hiatal hernia is seen. Normal appendix visualized. Diverticulosis is seen mainly involving the sigmoid colon, however there is no evidence of diverticulitis. Vascular/Lymphatic: No pathologically enlarged lymph nodes. No acute vascular findings. Reproductive: Prior hysterectomy noted. Adnexal regions are unremarkable in appearance. Other:  None. Musculoskeletal:  No suspicious bone lesions identified. IMPRESSION: Small-bowel obstruction due to left lower quadrant Spigelian hernia. No signs of bowel ischemia identified. Moderate hiatal hernia. Colonic diverticulosis, without radiographic evidence of diverticulitis. Electronically Signed   By: Danae Orleans M.D.   On: 12/28/2023 14:28     Assessment & Plan:  [name] is a 83 y.o. female who was noted to have a small bowel obstruction secondary to a left spigelian hernia containing a loop of small bowel.  Imaging and blood work evaluated by myself.  -Upon my examination of the patient, I was unable to palpate any  evidence of an unreduced left spigelian  hernia.  For this reason, we decided to proceed with repeat CT of the abdomen and pelvis without IV contrast to evaluate for reduction of the bowel.  New CT scan is demonstrating reduction of bowel within hernia and hernia sac only containing fluid and area of fat necrosis. -Given that the patient's imaging suggests that hernia has been reduced and she feels better, will initiate a diet for her -Patient to be admitted to the hospitalist service given her age and medical comorbidities -NPO at midnight -Recommend bowel regimen -Will evaluate patient tomorrow for continued resolution of pain.  If patient's pain remains controlled, continues to have bowel function, and is able to tolerate a diet, anticipate that she will likely be able to go home tomorrow with plan for outpatient repair of her spigelian hernia -Appreciate hospitalist recommendations  All questions were answered to the satisfaction of the patient and family.  -- Theophilus Kinds, DO [facility] [address] [place], [place] [address] [phone] (office)

## 2023-12-28 NOTE — ED Provider Notes (Signed)
  EMERGENCY DEPARTMENT AT [facility] Provider Note   CSN: [id number] Arrival date & time: 12/28/23  1026     History  Chief Complaint  Patient presents with   Abdominal Pain    [name] is a 83 y.o. female.  She is having worsening central and left-sided abdominal pain over the course of the last 2 days.  Initially it was come and go but now it has been more consistent.  No nausea vomiting or urinary symptoms.  No fevers.  Having normal bowel movements.  Saw Dr. Ouida Sills today in the office and he sent her here for further evaluation.  The history is provided by the patient.  Abdominal Pain Pain location:  LUQ, LLQ and periumbilical Pain quality: aching   Pain severity:  Moderate Onset quality:  Gradual Duration:  3 days Timing:  Constant Progression:  Worsening Context: not trauma   Relieved by:  None tried Worsened by:  Palpation and position changes Associated symptoms: no chest pain, no constipation, no cough, no diarrhea, no dysuria, no fever, no hematemesis, no hematochezia, no hematuria, no nausea, no shortness of breath and no vomiting        Home Medications Prior to Admission medications   Medication Sig Start Date End Date Taking? Authorizing Provider  acetaminophen (TYLENOL) 650 MG CR tablet Take 1,300 mg by mouth 2 (two) times daily.    [provider]  aspirin EC 81 MG tablet Take 81 mg by mouth at bedtime.     [provider]  atorvastatin (LIPITOR) 40 MG tablet Take 1 tablet (40 mg total) by mouth daily. 08/07/23   Johnson, Clanford L, MD  carvedilol (COREG) 12.5 MG tablet Take 0.5 tablets (6.25 mg total) by mouth 2 (two) times daily with a meal. Take 1/2 tablet by 2 times daily 08/07/23   Johnson, Clanford L, MD  cloNIDine (CATAPRES) 0.3 MG tablet Take 0.15 mg by mouth 2 (two) times daily.    [provider]  diltiazem (CARDIZEM CD) 180 MG 24 hr capsule Take 180 mg by mouth daily.  03/05/13   [provider]  empagliflozin (JARDIANCE) 10 MG TABS tablet Take 1 tablet (10 mg total) by mouth daily before breakfast. 11/13/23   Jonelle Sidle, MD  esomeprazole (NEXIUM) 40 MG capsule Take 1 capsule (40 mg total) by mouth every morning. 09/15/14   Setzer, Brand Males, NP  famotidine (PEPCID) 20 MG tablet One after supper 06/14/23   [name], MD  furosemide (LASIX) 40 MG tablet Take 1 tablet (40 mg total) by mouth daily. 08/07/23   Cleora Fleet, MD  hydroxyurea (HYDREA) 500 MG capsule Take 2 capsules (1,000 mg total) by mouth every Monday, Wednesday, and Friday AND 3 capsules (1,500 mg total) every Tuesday, Thursday, Saturday, and Sunday. You can split to 1500 mg dose into 2 tablets in the AM and 1 tablet PM.. 09/19/23   Rojelio Brenner M, PA-C  isosorbide mononitrate (IMDUR) 30 MG 24 hr tablet TAKE 1/2 (ONE-HALF) TABLET BY MOUTH AT BEDTIME Patient taking differently: 15 mg at bedtime. 07/17/23   Jonelle Sidle, MD  letrozole [facility]) 2.5 MG tablet Take 1 tablet by mouth once daily 09/19/23   Pennington, Rebekah M, PA-C  losartan (COZAAR) 100 MG tablet Take 100 mg by mouth every evening.  02/25/13   [provider]  Multiple Vitamins-Minerals (PRESERVISION AREDS PO) Take by mouth.    [provider]  Simethicone (GAS RELIEF PO) Take 1  tablet by mouth daily.    [provider]  spironolactone (ALDACTONE) 25 MG tablet Take 0.5 tablets (12.5 mg total) by mouth daily. 09/04/23 12/03/23  Jonelle Sidle, MD  Vitamin D, Ergocalciferol, (DRISDOL) 1.25 MG (50000 UNIT) CAPS capsule TAKE 1 CAPSULE BY MOUTH EVERY 14 DAYS 11/13/23   Rojelio Brenner M, PA-C      Allergies    Carbamazepine and Sulfa antibiotics    Review of Systems   Review of Systems  Constitutional:  Negative for fever.  Respiratory:  Negative for cough and shortness of breath.   Cardiovascular:  Negative for chest pain.  Gastrointestinal:  Positive for abdominal pain. Negative for  constipation, diarrhea, hematemesis, hematochezia, nausea and vomiting.  Genitourinary:  Negative for dysuria and hematuria.    Physical Exam Updated Vital Signs BP (!) 172/76 (BP Location: Right Arm)   Pulse 73   Temp 98 F (36.7 C) (Oral)   Resp 17   Ht 5\' 4"  (1.626 m)   Wt 106.6 kg   SpO2 94%   BMI 40.34 kg/m  Physical Exam Vitals and nursing note reviewed.  Constitutional:      General: She is not in acute distress.    Appearance: She is well-developed.  HENT:     Head: Normocephalic and atraumatic.  Eyes:     Conjunctiva/sclera: Conjunctivae normal.  Cardiovascular:     Rate and Rhythm: Normal rate and regular rhythm.     Heart sounds: Murmur (SEM) heard.  Pulmonary:     Effort: Pulmonary effort is normal. No respiratory distress.     Breath sounds: Normal breath sounds.  Abdominal:     Palpations: Abdomen is soft.     Tenderness: There is abdominal tenderness in the periumbilical area, left upper quadrant and left lower quadrant. There is no guarding or rebound.  Musculoskeletal:        General: No swelling.     Cervical back: Neck supple.  Skin:    General: Skin is warm and dry.     Capillary Refill: Capillary refill takes less than 2 seconds.  Neurological:     General: No focal deficit present.     Mental Status: She is alert.     ED Results / Procedures / Treatments   Labs (all labs ordered are listed, but only abnormal results are displayed) Labs Reviewed  COMPREHENSIVE METABOLIC PANEL - Abnormal; Notable for the following components:      Result Value   Glucose, Bld 103 (*)    Total Bilirubin 1.3 (*)    All other components within normal limits  CBC - Abnormal; Notable for the following components:   RBC 3.43 (*)    Hemoglobin 15.1 (*)    MCV 124.2 (*)    MCH 44.0 (*)    All other components within normal limits  URINALYSIS, ROUTINE W REFLEX MICROSCOPIC - Abnormal; Notable for the following components:   Glucose, UA >=500 (*)    All other  components within normal limits  LIPASE, BLOOD  PROTIME-INR  TYPE AND SCREEN    EKG None  Radiology CT ABDOMEN PELVIS WO CONTRAST Result Date: 12/28/2023 CLINICAL DATA:  Hiatal hernia suspected EXAM: CT ABDOMEN AND PELVIS WITHOUT CONTRAST TECHNIQUE: Multidetector CT imaging of the abdomen and pelvis was performed following the standard protocol without IV contrast. RADIATION DOSE REDUCTION: This exam was performed according to the departmental dose-optimization program which includes automated exposure control, adjustment of the mA and/or kV according to patient size and/or use of iterative  reconstruction technique. COMPARISON:  None Available. FINDINGS: Lower chest: Lung bases are clear. Hepatobiliary: No focal hepatic lesion. Normal gallbladder. No biliary duct dilatation. Common bile duct is normal. Pancreas: Pancreas is normal. No ductal dilatation. No pancreatic inflammation. Spleen: Normal spleen Adrenals/urinary tract: Adrenal glands and kidneys are normal. Bilateral simple fluid attenuation renal cysts. No follow-up recommended. The ureters and bladder normal. Stomach/Bowel: Moderate size hiatal hernia. Approximately 1/3 third of the stomach above the hemidiaphragms posterior the heart. Sliding-type hiatal hernia. Duodenum normal. No evidence of bowel obstruction. Mild haziness within the small bowel mesentery of the LEFT lower quadrant (image 58/2). There is a lateral abdominal wall hernia along the lateral margin of the LEFT rectus muscle in the LEFT lower quadrant. This is best seen on sagittal series (image [id number] series 5). Loop of small bowel is trapped within this hernia with small amount of fluid in the hernia sac. Vascular/Lymphatic: Abdominal aorta is normal caliber with atherosclerotic calcification. There is no retroperitoneal or periportal lymphadenopathy. No pelvic lymphadenopathy. Reproductive: Post hysterectomy.  Adnexa unremarkable Other: No free fluid. Musculoskeletal: No  aggressive osseous lesion. IMPRESSION: Lateral abdominal wall hernia in the LEFT lower quadrant contains a loop of entrapped small bowel with potential obstruction. Recommend surgical consultation. Electronically Signed   By: Genevive Bi M.D.   On: 12/28/2023 18:41   CT ABDOMEN PELVIS W CONTRAST Result Date: 12/28/2023 CLINICAL DATA:  Left lower quadrant and flank pain. Tenderness to palpation. EXAM: CT ABDOMEN AND PELVIS WITH CONTRAST TECHNIQUE: Multidetector CT imaging of the abdomen and pelvis was performed using the standard protocol following bolus administration of intravenous contrast. RADIATION DOSE REDUCTION: This exam was performed according to the departmental dose-optimization program which includes automated exposure control, adjustment of the mA and/or kV according to patient size and/or use of iterative reconstruction technique. CONTRAST:  OMNIPAQUE IOHEXOL 300 MG/ML  SOLN COMPARISON:  None Available. FINDINGS: Lower Chest: No acute findings. Elevation of right hemidiaphragm noted. Hepatobiliary: No suspicious hepatic masses identified. Prior cholecystectomy. No evidence of biliary obstruction. Pancreas:  No mass or inflammatory changes. Spleen: Within normal limits in size and appearance. Adrenals/Urinary Tract: No suspicious masses identified. No evidence of ureteral calculi or hydronephrosis. Stomach/Bowel: Left lower quadrant spigelian hernia is seen containing a dilated small bowel loop and small amount of fluid. Moderate dilatation of proximal small bowel loops is seen, consistent with small-bowel obstruction. No evidence of bowel wall thickening or pneumatosis. A moderate hiatal hernia is seen. Normal appendix visualized. Diverticulosis is seen mainly involving the sigmoid colon, however there is no evidence of diverticulitis. Vascular/Lymphatic: No pathologically enlarged lymph nodes. No acute vascular findings. Reproductive: Prior hysterectomy noted. Adnexal regions are  unremarkable in appearance. Other:  None. Musculoskeletal:  No suspicious bone lesions identified. IMPRESSION: Small-bowel obstruction due to left lower quadrant Spigelian hernia. No signs of bowel ischemia identified. Moderate hiatal hernia. Colonic diverticulosis, without radiographic evidence of diverticulitis. Electronically Signed   By: Danae Orleans M.D.   On: 12/28/2023 14:28    Procedures Hernia reduction  Date/Time: 12/28/2023 3:04 PM  Performed by: Terrilee Files, MD Authorized by: Terrilee Files, MD  Consent: Verbal consent obtained. Consent given by: patient Patient understanding: patient states understanding of the procedure being performed Patient identity confirmed: verbally with patient Local anesthesia used: no  Anesthesia: Local anesthesia used: no  Sedation: Patient sedated: no  Patient tolerance: patient tolerated the procedure well with no immediate complications Comments: Slightly had some improvement in left lower quadrant mass.   .Critical  Care  Performed by: Terrilee Files, MD Authorized by: Terrilee Files, MD   Critical care provider statement:    Critical care time (minutes):  45   Critical care time was exclusive of:  Separately billable procedures and treating other patients   Critical care was necessary to treat or prevent imminent or life-threatening deterioration of the following conditions: Incarcerated hernia.   Critical care was time spent personally by me on the following activities:  Development of treatment plan with patient or surrogate, discussions with consultants, evaluation of patient's response to treatment, examination of patient, obtaining history from patient or surrogate, ordering and performing treatments and interventions, ordering and review of laboratory studies, ordering and review of radiographic studies, pulse oximetry and re-evaluation of patient's condition   I assumed direction of critical care for this patient from  another provider in my specialty: no       Medications Ordered in ED Medications  sodium chloride 0.9 % bolus 500 mL (0 mLs Intravenous Stopped 12/28/23 1454)  fentaNYL (SUBLIMAZE) injection 50 mcg (50 mcg Intravenous Given 12/28/23 1454)  iohexol (OMNIPAQUE) 300 MG/ML solution 100 mL (100 mLs Intravenous Contrast Given 12/28/23 1244)  0.9 %  sodium chloride infusion ( Intravenous New Bag/Given 12/28/23 1504)  ondansetron (ZOFRAN) injection 4 mg (4 mg Intravenous Given 12/28/23 1450)    ED Course/ Medical Decision Making/ A&P Clinical Course as of 12/28/23 1851  Fri Dec 28, 2023  1441 Patient CT showed SBO due to left lower quadrant hernia.  I reviewed this with Dr. Robyne Peers of  general surgery.  She asked if we could try bedside reduction but she is also going to come in to evaluate patient. [MB]  1505 Patient has baseline shortness of breath and uses oxygen at night.  She had some vomiting prior to receiving pain medication for bedside hernia manipulation.  During reduction she desatted a little bit so we placed her on some oxygen. [MB]    Clinical Course User Index [MB] Terrilee Files, MD                                 Medical Decision Making Amount and/or Complexity of Data Reviewed Labs: ordered. Radiology: ordered.  Risk Prescription drug management.   This patient complains of mid and lower abdominal pain; this involves an extensive number of treatment Options and is a complaint that carries with it a high risk of complications and morbidity. The differential includes diverticulitis, colitis, perforation, obstruction  I ordered, reviewed and interpreted labs, which included CBC unremarkable chemistries LFTs unremarkable urinalysis without signs of infection I ordered medication IV fluids IV pain medication and nausea medication and reviewed PMP when indicated. I ordered imaging studies which included CT abdomen and pelvis and I independently    visualized and  interpreted imaging which showed incarcerated hernia Additional history obtained from patient's daughter Previous records obtained and reviewed in epic no recent admissions I consulted general surgery Dr. Robyne Peers and discussed lab and imaging findings and discussed disposition.  Cardiac monitoring reviewed, sinus rhythm Social determinants considered, no significant barriers Critical Interventions: Attempted reduction of incarcerated hernia  After the interventions stated above, I reevaluated the patient and found patient to be feeling a little bit better Admission and further testing considered, her care is signed out to Dr. Estell Harpin.  Patient will be seen by general surgery for further recommendations.  Anticipate will need admission  Final Clinical Impression(s) / ED Diagnoses Final diagnoses:  Incarcerated ventral hernia  Small bowel obstruction [facility])    Rx / DC Orders ED Discharge Orders     None         Terrilee Files, MD 12/28/23 [phone]

## 2023-12-28 NOTE — ED Triage Notes (Signed)
 Pt c/o left upper and middle lower abdominal pain that radiates around to her flank  Pt was sent here from Dr. Ouida Sills; pt is having pain and especially with palpation  Pt denies any n/v/d and states her last BM was yesterday and it was normal

## 2023-12-28 NOTE — H&P (Signed)
 History and Physical    Patient: [name] VHQ:[id number] DOB: 07-Jan-1941 DOA: 12/28/2023 DOS: the patient was seen and examined on 12/28/2023 PCP: Carylon Perches, MD  Patient coming from: Home  Chief Complaint:  Chief Complaint  Patient presents with   Abdominal Pain   HPI: [name] is an 83 y.o. female with medical history significant of hypertension, chronic HFpEF, chronic respiratory failure with hypoxia, TIA, endometrial cancer s/p colectomy (2012), GERD with hiatal hernia, surgical history of uterine cancer s/p abdominal hysterectomy with salpingo-oophorectomy, breast lumpectomy and sentinel lymph node biopsy due to breast cancer who presents to the emergency department due to left lower quadrant abdominal pain which started earlier this week and has been worsening within the last 2 to 3 days.  She complained of the pain worsening around the left hip, so she followed up with her PCP for further evaluation and management.  Patient endorsed nausea and multiple episodes of NBNB vomitus while in the ED.  Last bowel movement was yesterday in the morning and she still passes flatus.  ED Course:  In the emergency department, BP was 172/76, other vital signs were within normal range.  Workup in the ED showed hemoglobin 15.1, hematocrit 42.6, WBC 6.8, MCV 124.2, platelets 203.  Urinalysis was positive for glycosuria, lipase was 40. CT abdomen pelvis with contrast showed small bowel obstruction due to left lower quadrant spigelian hernia.  No signs of bowel ischemia identified.  Moderate hiatal hernia. CT abdomen and pelvis without contrast showed that the hernia has been reduced from CT scan same day acquired at 3:15 PM.  There is now only fluid within the hernia sac.  No bowel within the hernia sac. She was treated with IV fentanyl 50 mcg x 1, IV hydration was provided Zofran was given. General surgery ( Dr. Robyne Peers) was consulted and following with the patient.  Hospitalist was asked  admit patient for further evaluation and management.  Review of Systems: Review of systems as noted in the HPI. All other systems reviewed and are negative.   Past Medical History:  Diagnosis Date   Arthritis    Chronic diastolic heart failure (HCC)    Essential hypertension    GERD (gastroesophageal reflux disease)    Gout    Hiatal hernia    History of endometrial cancer    Sp hysterectomy 2012   Ischemic colitis (HCC) 2016   Myeloproliferative disorder, JAK-2 positive    Obesity    Pneumonia    Polycythemia vera(238.4)    TIA (transient ischemic attack)    Past Surgical History:  Procedure Laterality Date   ABDOMINAL HYSTERECTOMY  09/12/2011   Procedure: HYSTERECTOMY ABDOMINAL;  Surgeon: Laurette Schimke, MD;  Location: [facility];  Service: Gynecology;  Laterality: N/A;  Total Abdominal Hysterectomy, Bilateral Salpingo Oophorectomy   BREAST LUMPECTOMY Left 07/10/2017   LEFT BREAST LUMPECTOMY WITH RADIOACTIVE SEED AND SENTINEL LYMPH NODE BIOPSY    BREAST LUMPECTOMY WITH RADIOACTIVE SEED AND SENTINEL LYMPH NODE BIOPSY Left 07/10/2017   Procedure: LEFT BREAST LUMPECTOMY WITH RADIOACTIVE SEED AND SENTINEL LYMPH NODE BIOPSY;  Surgeon: Emelia Loron, MD;  Location: [facility] OR;  Service: General;  Laterality: Left;   COLONOSCOPY  07/05/2012   Procedure: COLONOSCOPY;  Surgeon: Malissa Hippo, MD;  Location: [facility];  Service: Endoscopy;  Laterality: N/A;  730   EYE SURGERY     bilateral 8 - 12 yrs ago   FLEXIBLE SIGMOIDOSCOPY N/A 04/03/2015   Procedure: FLEXIBLE SIGMOIDOSCOPY;  Surgeon: Malissa Hippo,  MD;  Location: [facility];  Service: Endoscopy;  Laterality: N/A;   HYSTEROSCOPY WITH D & C  08/15/2011   Procedure: DILATATION AND CURETTAGE (D&C) /HYSTEROSCOPY;  Surgeon: Tilda Burrow, MD;  Location: [facility];  Service: Gynecology;  Laterality: N/A;  With Suction Curette   JOINT REPLACEMENT     left knee replaced 2014   KNEE ARTHROPLASTY Left 03/15/2015   Procedure: COMPUTER  ASSISTED TOTAL KNEE ARTHROPLASTY;  Surgeon: Eldred Manges, MD;  Location: [facility] OR;  Service: Orthopedics;  Laterality: Left;   SALPINGOOPHORECTOMY  09/12/2011   Procedure: SALPINGO OOPHERECTOMY;  Surgeon: Laurette Schimke, MD;  Location: [facility];  Service: Gynecology;  Laterality: Bilateral;    Social History:  reports that she has never smoked. She has never been exposed to tobacco smoke. She has never used smokeless tobacco. She reports that she does not drink alcohol and does not use drugs.   Allergies  Allergen Reactions   Carbamazepine Hives and Other (See Comments)    headache   Sulfa Antibiotics     Rash    Family History  Problem Relation Age of Onset   Hypertension Mother    Lung cancer Father    Arthritis/Rheumatoid Sister    Anesthesia problems Neg Hx    Hypotension Neg Hx    Malignant hyperthermia Neg Hx    Pseudochol deficiency Neg Hx      Prior to Admission medications   Medication Sig Start Date End Date Taking? Authorizing Provider  acetaminophen (TYLENOL) 650 MG CR tablet Take 1,300 mg by mouth 2 (two) times daily.    [provider]  aspirin EC 81 MG tablet Take 81 mg by mouth at bedtime.     [provider]  atorvastatin (LIPITOR) 40 MG tablet Take 1 tablet (40 mg total) by mouth daily. 08/07/23   Johnson, Clanford L, MD  carvedilol (COREG) 12.5 MG tablet Take 0.5 tablets (6.25 mg total) by mouth 2 (two) times daily with a meal. Take 1/2 tablet by 2 times daily 08/07/23   Johnson, Clanford L, MD  cloNIDine (CATAPRES) 0.3 MG tablet Take 0.15 mg by mouth 2 (two) times daily.    [provider]  diltiazem (CARDIZEM CD) 180 MG 24 hr capsule Take 180 mg by mouth daily.  03/05/13   [provider]  empagliflozin (JARDIANCE) 10 MG TABS tablet Take 1 tablet (10 mg total) by mouth daily before breakfast. 11/13/23   Jonelle Sidle, MD  esomeprazole (NEXIUM) 40 MG capsule Take 1 capsule (40 mg total) by mouth every morning. 09/15/14   Setzer,  Brand Males, NP  famotidine (PEPCID) 20 MG tablet One after supper 06/14/23   [name], MD  furosemide (LASIX) 40 MG tablet Take 1 tablet (40 mg total) by mouth daily. 08/07/23   Cleora Fleet, MD  hydroxyurea (HYDREA) 500 MG capsule Take 2 capsules (1,000 mg total) by mouth every Monday, Wednesday, and Friday AND 3 capsules (1,500 mg total) every Tuesday, Thursday, Saturday, and Sunday. You can split to 1500 mg dose into 2 tablets in the AM and 1 tablet PM.. 09/19/23   Rojelio Brenner M, PA-C  isosorbide mononitrate (IMDUR) 30 MG 24 hr tablet TAKE 1/2 (ONE-HALF) TABLET BY MOUTH AT BEDTIME Patient taking differently: 15 mg at bedtime. 07/17/23   Jonelle Sidle, MD  letrozole [facility]) 2.5 MG tablet Take 1 tablet by mouth once daily 09/19/23   Rojelio Brenner M, PA-C  losartan (COZAAR) 100 MG tablet Take 100 mg  by mouth every evening.  02/25/13   [provider]  Multiple Vitamins-Minerals (PRESERVISION AREDS PO) Take by mouth.    [provider]  Simethicone (GAS RELIEF PO) Take 1 tablet by mouth daily.    [provider]  spironolactone (ALDACTONE) 25 MG tablet Take 0.5 tablets (12.5 mg total) by mouth daily. 09/04/23 12/03/23  Jonelle Sidle, MD  Vitamin D, Ergocalciferol, (DRISDOL) 1.25 MG (50000 UNIT) CAPS capsule TAKE 1 CAPSULE BY MOUTH EVERY 14 DAYS 11/13/23   Carnella Guadalajara, PA-C    Physical Exam: BP (!) 141/61 (BP Location: Right Arm)   Pulse 72   Temp 97.8 F (36.6 C) (Oral)   Resp 15   Ht 5\' 4"  (1.626 m)   Wt 106.6 kg   SpO2 95%   BMI 40.34 kg/m   General: 83 y.o. year-old female well developed well nourished in no acute distress.  Alert and oriented x3. HEENT: NCAT, EOMI Neck: Supple, trachea medial Cardiovascular: Regular rate and rhythm with no rubs or gallops.  No thyromegaly or JVD noted.  No lower extremity edema. 2/4 pulses in all 4 extremities. Respiratory: Clear to auscultation with no wheezes or rales. Good  inspiratory effort. Abdomen: Soft, nontender nondistended with normal bowel sounds x4 quadrants. Muskuloskeletal: No cyanosis, clubbing or edema noted bilaterally Neuro: CN II-XII intact, strength 5/5 x 4, sensation, reflexes intact Skin: No ulcerative lesions noted or rashes Psychiatry: Judgement and insight appear normal. Mood is appropriate for condition and setting          Labs on Admission:  Basic Metabolic Panel: Recent Labs  Lab 12/28/23 1049  NA 139  K 4.0  CL 100  CO2 29  GLUCOSE 103*  BUN 15  CREATININE 0.61  CALCIUM 9.7   Liver Function Tests: Recent Labs  Lab 12/28/23 1049  AST 16  ALT 14  ALKPHOS 83  BILITOT 1.3*  PROT 7.2  ALBUMIN 4.0   Recent Labs  Lab 12/28/23 1049  LIPASE 40   No results for input(s): "AMMONIA" in the last 168 hours. CBC: Recent Labs  Lab 12/28/23 1049  WBC 6.8  HGB 15.1*  HCT 42.6  MCV 124.2*  PLT 203   Cardiac Enzymes: No results for input(s): "CKTOTAL", "CKMB", "CKMBINDEX", "TROPONINI" in the last 168 hours.  BNP (last 3 results) Recent Labs    08/07/23 0413  BNP 117.0*    ProBNP (last 3 results) No results for input(s): "PROBNP" in the last 8760 hours.  CBG: No results for input(s): "GLUCAP" in the last 168 hours.  Radiological Exams on Admission: CT ABDOMEN PELVIS WO CONTRAST Addendum Date: 12/28/2023 ADDENDUM REPORT: 12/28/2023 18:58 ADDENDUM: Upon further review, the hernia has been reduced from CT scan same day acquired at 12:51 p.m. There is now only fluid within the hernia sac. No bowel within the hernia sac. There is ovoid periphery calcified focus of fat necrosis within the hernia sac . Findings conveyed toCATHERINE PAPPAYLIOU on 12/28/2023  at18:57. Electronically Signed   By: Genevive Bi M.D.   On: 12/28/2023 18:58   Result Date: 12/28/2023 CLINICAL DATA:  Hiatal hernia suspected EXAM: CT ABDOMEN AND PELVIS WITHOUT CONTRAST TECHNIQUE: Multidetector CT imaging of the abdomen and pelvis was  performed following the standard protocol without IV contrast. RADIATION DOSE REDUCTION: This exam was performed according to the departmental dose-optimization program which includes automated exposure control, adjustment of the mA and/or kV according to patient size and/or use of iterative reconstruction technique. COMPARISON:  None Available. FINDINGS: Lower  chest: Lung bases are clear. Hepatobiliary: No focal hepatic lesion. Normal gallbladder. No biliary duct dilatation. Common bile duct is normal. Pancreas: Pancreas is normal. No ductal dilatation. No pancreatic inflammation. Spleen: Normal spleen Adrenals/urinary tract: Adrenal glands and kidneys are normal. Bilateral simple fluid attenuation renal cysts. No follow-up recommended. The ureters and bladder normal. Stomach/Bowel: Moderate size hiatal hernia. Approximately 1/3 third of the stomach above the hemidiaphragms posterior the heart. Sliding-type hiatal hernia. Duodenum normal. No evidence of bowel obstruction. Mild haziness within the small bowel mesentery of the LEFT lower quadrant (image 58/2). There is a lateral abdominal wall hernia along the lateral margin of the LEFT rectus muscle in the LEFT lower quadrant. This is best seen on sagittal series (image [id number] series 5). Loop of small bowel is trapped within this hernia with small amount of fluid in the hernia sac. Vascular/Lymphatic: Abdominal aorta is normal caliber with atherosclerotic calcification. There is no retroperitoneal or periportal lymphadenopathy. No pelvic lymphadenopathy. Reproductive: Post hysterectomy.  Adnexa unremarkable Other: No free fluid. Musculoskeletal: No aggressive osseous lesion. IMPRESSION: Lateral abdominal wall hernia in the LEFT lower quadrant contains a loop of entrapped small bowel with potential obstruction. Recommend surgical consultation. Electronically Signed: By: Genevive Bi M.D. On: 12/28/2023 18:41   CT ABDOMEN PELVIS W CONTRAST Result Date:  12/28/2023 CLINICAL DATA:  Left lower quadrant and flank pain. Tenderness to palpation. EXAM: CT ABDOMEN AND PELVIS WITH CONTRAST TECHNIQUE: Multidetector CT imaging of the abdomen and pelvis was performed using the standard protocol following bolus administration of intravenous contrast. RADIATION DOSE REDUCTION: This exam was performed according to the departmental dose-optimization program which includes automated exposure control, adjustment of the mA and/or kV according to patient size and/or use of iterative reconstruction technique. CONTRAST:  OMNIPAQUE IOHEXOL 300 MG/ML  SOLN COMPARISON:  None Available. FINDINGS: Lower Chest: No acute findings. Elevation of right hemidiaphragm noted. Hepatobiliary: No suspicious hepatic masses identified. Prior cholecystectomy. No evidence of biliary obstruction. Pancreas:  No mass or inflammatory changes. Spleen: Within normal limits in size and appearance. Adrenals/Urinary Tract: No suspicious masses identified. No evidence of ureteral calculi or hydronephrosis. Stomach/Bowel: Left lower quadrant spigelian hernia is seen containing a dilated small bowel loop and small amount of fluid. Moderate dilatation of proximal small bowel loops is seen, consistent with small-bowel obstruction. No evidence of bowel wall thickening or pneumatosis. A moderate hiatal hernia is seen. Normal appendix visualized. Diverticulosis is seen mainly involving the sigmoid colon, however there is no evidence of diverticulitis. Vascular/Lymphatic: No pathologically enlarged lymph nodes. No acute vascular findings. Reproductive: Prior hysterectomy noted. Adnexal regions are unremarkable in appearance. Other:  None. Musculoskeletal:  No suspicious bone lesions identified. IMPRESSION: Small-bowel obstruction due to left lower quadrant Spigelian hernia. No signs of bowel ischemia identified. Moderate hiatal hernia. Colonic diverticulosis, without radiographic evidence of diverticulitis.  Electronically Signed   By: Danae Orleans M.D.   On: 12/28/2023 14:28    EKG: I independently viewed the EKG done and my findings are as followed: EKG was not done in the ED  Assessment/Plan Present on Admission:  Abdominal hernia  Chronic respiratory failure with hypoxia (HCC)  Essential hypertension  GERD (gastroesophageal reflux disease)  Obesity, Class III, BMI 40-49.9 (morbid obesity) (HCC)  Principal Problem:   Abdominal hernia Active Problems:   GERD (gastroesophageal reflux disease)   Essential hypertension   Malignant neoplasm of upper-outer quadrant of left breast in female, estrogen receptor positive (HCC)   Obesity, Class III, BMI 40-49.9 (morbid obesity) (HCC)  Chronic respiratory failure with hypoxia (HCC)   Spigelian hernia   Small bowel obstruction (HCC)   Elevated MCV   Chronic heart failure with preserved ejection fraction (HFpEF) (HCC)   History of CVA (cerebrovascular accident)   Polycythemia vera (HCC)   Abdominal hernia Small bowel obstruction secondary to left spigelian hernia Repeat CT of abdomen and pelvis without IV contrast showed reduction of bowel within the hernia and hernia sac only containing fluid.  No bowel within the hernia sac.  Calcified focus of fat necrosis within the hernia sac. Continue IV fentanyl 12.5 mcg q.h p.r.n. for moderate to severe pain Continue IV Zofran p.r.n. Continue bowel regimen with MiraLAX, Senokot-S, and Dulcolax suppositories per surgical recommendation Continue regular fluid consistency diet  N.p.o. at midnight per surgeon's recommendation General Surgery consult appreciated  Elevated MCV MCV 124.2, vitamin B12 and folate levels will be checked  Chronic respiratory failure with hypoxia Patient follows with Dr. Sherene Sires due to history of nonspecific interstitial pneumonia Continue baseline home oxygen supplementation  Chronic HFpEF Continue aspirin, Lipitor, Lasix, Jardiance  Essential hypertension Continue  Lasix, clonidine, diltiazem  History of CVA Continue aspirin, statin  Leg cramps Continue home diltiazem  Malignant neoplasm of upper-outer quadrant of left breast (stage Iia; T1c N1a) Continue letrozole Continue follow-up with oncology clinic   Polycythemia vera  Hgb 15.1g/dl.  Continue hydrea, continue to follow-up with oncology   GERD:  Continue PPI  Morbid obesity (BMI 40.34) Patient was counseled about the cardiovascular and metabolic risk of morbid obesity. Patient was counseled for diet control, exercise regimen and weight loss.   DVT prophylaxis: Lovenox  Code Status: Full code  Family Communication: None at bedside  Consults: General Surgery  Severity of Illness: The appropriate patient status for this patient is INPATIENT. Inpatient status is judged to be reasonable and necessary in order to provide the required intensity of service to ensure the patient's safety. The patient's presenting symptoms, physical exam findings, and initial radiographic and laboratory data in the context of their chronic comorbidities is felt to place them at high risk for further clinical deterioration. Furthermore, it is not anticipated that the patient will be medically stable for discharge from the hospital within 2 midnights of admission.   * I certify that at the point of admission it is my clinical judgment that the patient will require inpatient hospital care spanning beyond 2 midnights from the point of admission due to high intensity of service, high risk for further deterioration and high frequency of surveillance required.*  Author: Frankey Shown, DO 12/28/2023 9:32 PM  For on call review [email or web address].

## 2023-12-28 NOTE — Progress Notes (Signed)
 PHARMACIST - PHYSICIAN COMMUNICATION  CONCERNING:  Enoxaparin (Lovenox) for DVT Prophylaxis    RECOMMENDATION: Patient was prescribed enoxaprin 40mg  q24 hours for VTE prophylaxis.   Filed Weights   12/28/23 1035  Weight: 106.6 kg (235 lb)    Body mass index is 40.34 kg/m.  Estimated Creatinine Clearance: 62.4 mL/min (by C-G formula based on SCr of 0.61 mg/dL).   Based on [facility] policy patient is candidate for enoxaparin 0.5mg /kg TBW SQ every 24 hours based on BMI being >30.  DESCRIPTION: Pharmacy has adjusted enoxaparin dose per [facility] policy.  Patient is now receiving enoxaparin 52.5 mg every 24 hours    Merryl Hacker, PharmD Clinical Pharmacist  12/28/2023 9:26 PM

## 2023-12-29 DIAGNOSIS — K436 Other and unspecified ventral hernia with obstruction, without gangrene: Principal | ICD-10-CM

## 2023-12-29 DIAGNOSIS — K469 Unspecified abdominal hernia without obstruction or gangrene: Secondary | ICD-10-CM | POA: Diagnosis not present

## 2023-12-29 DIAGNOSIS — K439 Ventral hernia without obstruction or gangrene: Secondary | ICD-10-CM | POA: Diagnosis not present

## 2023-12-29 DIAGNOSIS — K56609 Unspecified intestinal obstruction, unspecified as to partial versus complete obstruction: Secondary | ICD-10-CM | POA: Diagnosis not present

## 2023-12-29 LAB — CBC
HCT: 39.6 % (ref 36.0–46.0)
Hemoglobin: 13.6 g/dL (ref 12.0–15.0)
MCH: 43.2 pg — ABNORMAL HIGH (ref 26.0–34.0)
MCHC: 34.3 g/dL (ref 30.0–36.0)
MCV: 125.7 fL — ABNORMAL HIGH (ref 80.0–100.0)
Platelets: 188 10*3/uL (ref 150–400)
RBC: 3.15 MIL/uL — ABNORMAL LOW (ref 3.87–5.11)
RDW: 12.8 % (ref 11.5–15.5)
WBC: 7.5 10*3/uL (ref 4.0–10.5)
nRBC: 0 % (ref 0.0–0.2)

## 2023-12-29 LAB — COMPREHENSIVE METABOLIC PANEL
ALT: 13 U/L (ref 0–44)
AST: 15 U/L (ref 15–41)
Albumin: 3.3 g/dL — ABNORMAL LOW (ref 3.5–5.0)
Alkaline Phosphatase: 73 U/L (ref 38–126)
Anion gap: 7 (ref 5–15)
BUN: 12 mg/dL (ref 8–23)
CO2: 26 mmol/L (ref 22–32)
Calcium: 8.9 mg/dL (ref 8.9–10.3)
Chloride: 105 mmol/L (ref 98–111)
Creatinine, Ser: 0.44 mg/dL (ref 0.44–1.00)
GFR, Estimated: >60 mL/min (ref >60)
Glucose, Bld: 90 mg/dL (ref 70–99)
Potassium: 3.8 mmol/L (ref 3.5–5.1)
Sodium: 138 mmol/L (ref 135–145)
Total Bilirubin: 1.2 mg/dL (ref 0.0–1.2)
Total Protein: 6.1 g/dL — ABNORMAL LOW (ref 6.5–8.1)

## 2023-12-29 LAB — FOLATE: Folate: 13.7 ng/mL (ref >5.9)

## 2023-12-29 LAB — MAGNESIUM: Magnesium: 2 mg/dL (ref 1.7–2.4)

## 2023-12-29 LAB — PHOSPHORUS: Phosphorus: 3.3 mg/dL (ref 2.5–4.6)

## 2023-12-29 LAB — VITAMIN B12: Vitamin B-12: 135 pg/mL — ABNORMAL LOW (ref 180–914)

## 2023-12-29 MED ORDER — ENOXAPARIN SODIUM 40 MG/0.4ML IJ SOSY: 40.0000 mg | PREFILLED_SYRINGE | INTRAMUSCULAR | Status: DC

## 2023-12-29 MED ORDER — POLYETHYLENE GLYCOL 3350 17 G PO PACK: 17.0000 g | PACK | Freq: Every day | ORAL | 0 refills | Status: AC

## 2023-12-29 MED ORDER — BISACODYL 10 MG RE SUPP: 10.0000 mg | Freq: Every day | RECTAL | 0 refills | Status: AC | PRN

## 2023-12-29 MED ORDER — SENNOSIDES-DOCUSATE SODIUM 8.6-50 MG PO TABS: 1.0000 | ORAL_TABLET | Freq: Two times a day (BID) | ORAL | 0 refills | Status: AC

## 2023-12-29 MED ORDER — CYANOCOBALAMIN 1000 MCG/ML IJ SOLN
1000.0000 ug | Freq: Once | INTRAMUSCULAR | Status: AC
  Administered 2023-12-29: 1000 ug via INTRAMUSCULAR
  Filled 2023-12-29: qty 1

## 2023-12-29 NOTE — Progress Notes (Signed)
   12/29/23 1032  TOC Brief Assessment  Insurance and Status Reviewed  Patient has primary care physician Yes  Home environment has been reviewed Pt from home, adult children assist  Prior level of function: Independent  Prior/Current Home Services No current home services  Social Drivers of Health Review SDOH reviewed no interventions necessary  Readmission risk has been reviewed Yes  Transition of care needs no transition of care needs at this time    CSW delivered [place] , while pt in chair, daughter present.  No further TOC needs.

## 2023-12-29 NOTE — Discharge Summary (Signed)
 Physician Discharge Summary  [name]:[id number] DOB: 1941/09/02 DOA: 12/28/2023  PCP: Carylon Perches, MD  Admit date: 12/28/2023  Discharge date: 12/29/2023  Admitted From:Home  Disposition:  Home  Recommendations for Outpatient Follow-up:  Follow up with PCP in 1-2 weeks Follow-up with general surgery as recommended for consideration of robotic laparoscopic hernia repair with mesh by 2/28 Continue bowel regimen as prescribed Continue other home medications as prior  Home Health: None  Equipment/Devices: Has home 2 L nasal cannula available at bedtime as needed  Discharge Condition:Stable  CODE STATUS: Full  Diet recommendation: Heart Healthy  Brief/Interim Summary: [name] is an 83 y.o. female with medical history significant of hypertension, chronic HFpEF, chronic respiratory failure with hypoxia, TIA, endometrial cancer s/p colectomy (2012), GERD with hiatal hernia, surgical history of uterine cancer s/p abdominal hysterectomy with salpingo-oophorectomy, breast lumpectomy and sentinel lymph node biopsy due to breast cancer who presents to the emergency department due to left lower quadrant abdominal pain which started earlier this week and has been worsening within the last 2 to 3 days.  She was admitted after reduction of incarcerated left spigelian hernia.  She is overall doing much better with resolution of pain and return of bowel function and regular diet was ordered which she is now tolerating.  She will follow-up with general surgery outpatient for consideration of hernia repair with mesh in the near future.  No other acute events or concerns noted.  Discharge Diagnoses:  Principal Problem:   Abdominal hernia Active Problems:   GERD (gastroesophageal reflux disease)   Essential hypertension   Malignant neoplasm of upper-outer quadrant of left breast in female, estrogen receptor positive (HCC)   Obesity, Class III, BMI 40-49.9 (morbid obesity) (HCC)    Chronic respiratory failure with hypoxia (HCC)   Spigelian hernia   Small bowel obstruction (HCC)   Elevated MCV   Chronic heart failure with preserved ejection fraction (HFpEF) (HCC)   History of CVA (cerebrovascular accident)   Polycythemia vera (HCC)   Leg cramps   Incarcerated ventral hernia  Principal discharge diagnosis: Incarcerated left spigelian hernia with associated partial small bowel obstruction status post reduction.  Discharge Instructions  Discharge Instructions     Diet - low sodium heart healthy   Complete by: As directed    Increase activity slowly   Complete by: As directed       Allergies as of 12/29/2023       Reactions   Carbamazepine Hives, Other (See Comments)   headache   Sulfa Antibiotics    Rash        Medication List     TAKE these medications    acetaminophen 650 MG CR tablet Commonly known as: TYLENOL Take 1,300 mg by mouth 2 (two) times daily.   aspirin EC 81 MG tablet Take 81 mg by mouth at bedtime.   atorvastatin 40 MG tablet Commonly known as: LIPITOR Take 1 tablet (40 mg total) by mouth daily.   bisacodyl 10 MG suppository Commonly known as: DULCOLAX Place 1 suppository (10 mg total) rectally daily as needed for moderate constipation or severe constipation.   carvedilol 12.5 MG tablet Commonly known as: COREG Take 0.5 tablets (6.25 mg total) by mouth 2 (two) times daily with a meal. Take 1/2 tablet by 2 times daily   cloNIDine 0.3 MG tablet Commonly known as: CATAPRES Take 0.15 mg by mouth 2 (two) times daily.   diltiazem 180 MG 24 hr capsule Commonly known as: CARDIZEM CD Take  180 mg by mouth daily.   empagliflozin 10 MG Tabs tablet Commonly known as: Jardiance Take 1 tablet (10 mg total) by mouth daily before breakfast.   esomeprazole 40 MG capsule Commonly known as: NEXIUM Take 1 capsule (40 mg total) by mouth every morning.   famotidine 20 MG tablet Commonly known as: Pepcid One after supper    furosemide 40 MG tablet Commonly known as: LASIX Take 1 tablet (40 mg total) by mouth daily.   GAS RELIEF PO Take 1 tablet by mouth daily.   hydroxyurea 500 MG capsule Commonly known as: HYDREA Take 2 capsules (1,000 mg total) by mouth every Monday, Wednesday, and Friday AND 3 capsules (1,500 mg total) every Tuesday, Thursday, Saturday, and Sunday. You can split to 1500 mg dose into 2 tablets in the AM and 1 tablet PM..   isosorbide mononitrate 30 MG 24 hr tablet Commonly known as: IMDUR TAKE 1/2 (ONE-HALF) TABLET BY MOUTH AT BEDTIME What changed: See the new instructions.   letrozole 2.5 MG tablet Commonly known as: FEMARA Take 1 tablet by mouth once daily   losartan 100 MG tablet Commonly known as: COZAAR Take 100 mg by mouth every evening.   polyethylene glycol 17 g packet Commonly known as: MIRALAX / GLYCOLAX Take 17 g by mouth daily. Start taking on: December 30, 2023   PRESERVISION AREDS PO Take by mouth.   senna-docusate 8.6-50 MG tablet Commonly known as: Senokot-S Take 1 tablet by mouth 2 (two) times daily.   spironolactone 25 MG tablet Commonly known as: ALDACTONE Take 0.5 tablets (12.5 mg total) by mouth daily.   Vitamin D (Ergocalciferol) 1.25 MG (50000 UNIT) Caps capsule Commonly known as: DRISDOL TAKE 1 CAPSULE BY MOUTH EVERY 14 DAYS        Follow-up Information     Carylon Perches, MD. Schedule an appointment as soon as possible for a visit in 1 week(s).   Specialty: Internal Medicine Contact information: [address] [place] [address] [phone]         Lewie Chamber, DO. Go to.   Specialty: General Surgery Contact information: [address] [place] [facility] [address] [phone]                Allergies  Allergen Reactions   Carbamazepine Hives and Other (See Comments)    headache   Sulfa Antibiotics     Rash    Consultations: General surgery   Procedures/Studies: CT ABDOMEN PELVIS WO  CONTRAST Addendum Date: 12/28/2023 ADDENDUM REPORT: 12/28/2023 18:58 ADDENDUM: Upon further review, the hernia has been reduced from CT scan same day acquired at 12:51 p.m. There is now only fluid within the hernia sac. No bowel within the hernia sac. There is ovoid periphery calcified focus of fat necrosis within the hernia sac . Findings conveyed toCATHERINE PAPPAYLIOU on 12/28/2023  at18:57. Electronically Signed   By: Genevive Bi M.D.   On: 12/28/2023 18:58   Result Date: 12/28/2023 CLINICAL DATA:  Hiatal hernia suspected EXAM: CT ABDOMEN AND PELVIS WITHOUT CONTRAST TECHNIQUE: Multidetector CT imaging of the abdomen and pelvis was performed following the standard protocol without IV contrast. RADIATION DOSE REDUCTION: This exam was performed according to the departmental dose-optimization program which includes automated exposure control, adjustment of the mA and/or kV according to patient size and/or use of iterative reconstruction technique. COMPARISON:  None Available. FINDINGS: Lower chest: Lung bases are clear. Hepatobiliary: No focal hepatic lesion. Normal gallbladder. No biliary duct dilatation. Common bile duct is normal. Pancreas: Pancreas  is normal. No ductal dilatation. No pancreatic inflammation. Spleen: Normal spleen Adrenals/urinary tract: Adrenal glands and kidneys are normal. Bilateral simple fluid attenuation renal cysts. No follow-up recommended. The ureters and bladder normal. Stomach/Bowel: Moderate size hiatal hernia. Approximately 1/3 third of the stomach above the hemidiaphragms posterior the heart. Sliding-type hiatal hernia. Duodenum normal. No evidence of bowel obstruction. Mild haziness within the small bowel mesentery of the LEFT lower quadrant (image 58/2). There is a lateral abdominal wall hernia along the lateral margin of the LEFT rectus muscle in the LEFT lower quadrant. This is best seen on sagittal series (image [id number] series 5). Loop of small bowel is trapped within this  hernia with small amount of fluid in the hernia sac. Vascular/Lymphatic: Abdominal aorta is normal caliber with atherosclerotic calcification. There is no retroperitoneal or periportal lymphadenopathy. No pelvic lymphadenopathy. Reproductive: Post hysterectomy.  Adnexa unremarkable Other: No free fluid. Musculoskeletal: No aggressive osseous lesion. IMPRESSION: Lateral abdominal wall hernia in the LEFT lower quadrant contains a loop of entrapped small bowel with potential obstruction. Recommend surgical consultation. Electronically Signed: By: Genevive Bi M.D. On: 12/28/2023 18:41   CT ABDOMEN PELVIS W CONTRAST Result Date: 12/28/2023 CLINICAL DATA:  Left lower quadrant and flank pain. Tenderness to palpation. EXAM: CT ABDOMEN AND PELVIS WITH CONTRAST TECHNIQUE: Multidetector CT imaging of the abdomen and pelvis was performed using the standard protocol following bolus administration of intravenous contrast. RADIATION DOSE REDUCTION: This exam was performed according to the departmental dose-optimization program which includes automated exposure control, adjustment of the mA and/or kV according to patient size and/or use of iterative reconstruction technique. CONTRAST:  OMNIPAQUE IOHEXOL 300 MG/ML  SOLN COMPARISON:  None Available. FINDINGS: Lower Chest: No acute findings. Elevation of right hemidiaphragm noted. Hepatobiliary: No suspicious hepatic masses identified. Prior cholecystectomy. No evidence of biliary obstruction. Pancreas:  No mass or inflammatory changes. Spleen: Within normal limits in size and appearance. Adrenals/Urinary Tract: No suspicious masses identified. No evidence of ureteral calculi or hydronephrosis. Stomach/Bowel: Left lower quadrant spigelian hernia is seen containing a dilated small bowel loop and small amount of fluid. Moderate dilatation of proximal small bowel loops is seen, consistent with small-bowel obstruction. No evidence of bowel wall thickening or pneumatosis. A  moderate hiatal hernia is seen. Normal appendix visualized. Diverticulosis is seen mainly involving the sigmoid colon, however there is no evidence of diverticulitis. Vascular/Lymphatic: No pathologically enlarged lymph nodes. No acute vascular findings. Reproductive: Prior hysterectomy noted. Adnexal regions are unremarkable in appearance. Other:  None. Musculoskeletal:  No suspicious bone lesions identified. IMPRESSION: Small-bowel obstruction due to left lower quadrant Spigelian hernia. No signs of bowel ischemia identified. Moderate hiatal hernia. Colonic diverticulosis, without radiographic evidence of diverticulitis. Electronically Signed   By: Danae Orleans M.D.   On: 12/28/2023 14:28     Discharge Exam: Vitals:   12/29/23 0412 12/29/23 0846  BP: (!) 140/52 (!) 148/53  Pulse: 78 64  Resp: 19 20  Temp: 98 F (36.7 C) 97.7 F (36.5 C)  SpO2: 95% 94%   Vitals:   12/28/23 2214 12/28/23 2237 12/29/23 0412 12/29/23 0846  BP:  (!) 171/68 (!) 140/52 (!) 148/53  Pulse:  72 78 64  Resp:  (!) 24 19 20   Temp:  97.9 F (36.6 C) 98 F (36.7 C) 97.7 F (36.5 C)  TempSrc:  Oral  Oral  SpO2:  93% 95% 94%  Weight: 99.2 kg     Height:        General: Pt is alert, awake,  not in acute distress Cardiovascular: RRR, S1/S2 +, no rubs, no gallops Respiratory: CTA bilaterally, no wheezing, no rhonchi Abdominal: Soft, NT, ND, bowel sounds + Extremities: no edema, no cyanosis    The results of significant diagnostics from this hospitalization (including imaging, microbiology, ancillary and laboratory) are listed below for reference.     Microbiology: No results found for this or any previous visit (from the past 240 hours).   Labs: BNP (last 3 results) Recent Labs    08/07/23 0413  BNP 117.0*   Basic Metabolic Panel: Recent Labs  Lab 12/28/23 1049 12/29/23 0312  NA 139 138  K 4.0 3.8  CL 100 105  CO2 29 26  GLUCOSE 103* 90  BUN 15 12  CREATININE 0.61 0.44  CALCIUM 9.7 8.9   MG  --  2.0  PHOS  --  3.3   Liver Function Tests: Recent Labs  Lab 12/28/23 1049 12/29/23 0312  AST 16 15  ALT 14 13  ALKPHOS 83 73  BILITOT 1.3* 1.2  PROT 7.2 6.1*  ALBUMIN 4.0 3.3*   Recent Labs  Lab 12/28/23 1049  LIPASE 40   No results for input(s): "AMMONIA" in the last 168 hours. CBC: Recent Labs  Lab 12/28/23 1049 12/29/23 0312  WBC 6.8 7.5  HGB 15.1* 13.6  HCT 42.6 39.6  MCV 124.2* 125.7*  PLT 203 188   Cardiac Enzymes: No results for input(s): "CKTOTAL", "CKMB", "CKMBINDEX", "TROPONINI" in the last 168 hours. BNP: Invalid input(s): "POCBNP" CBG: No results for input(s): "GLUCAP" in the last 168 hours. D-Dimer No results for input(s): "DDIMER" in the last 72 hours. Hgb A1c No results for input(s): "HGBA1C" in the last 72 hours. Lipid Profile No results for input(s): "CHOL", "HDL", "LDLCALC", "TRIG", "CHOLHDL", "LDLDIRECT" in the last 72 hours. Thyroid function studies No results for input(s): "TSH", "T4TOTAL", "T3FREE", "THYROIDAB" in the last 72 hours.  Invalid input(s): "FREET3" Anemia work up Recent Labs    12/29/23 0312  VITAMINB12 135*  FOLATE 13.7   Urinalysis    Component Value Date/Time   COLORURINE YELLOW 12/28/2023 1219   APPEARANCEUR CLEAR 12/28/2023 1219   LABSPEC 1.014 12/28/2023 1219   PHURINE 7.0 12/28/2023 1219   GLUCOSEU >=500 (A) 12/28/2023 1219   HGBUR NEGATIVE 12/28/2023 1219   BILIRUBINUR NEGATIVE 12/28/2023 1219   KETONESUR NEGATIVE 12/28/2023 1219   PROTEINUR NEGATIVE 12/28/2023 1219   UROBILINOGEN 1.0 03/12/2015 1003   NITRITE NEGATIVE 12/28/2023 1219   LEUKOCYTESUR NEGATIVE 12/28/2023 1219   Sepsis Labs Recent Labs  Lab 12/28/23 1049 12/29/23 0312  WBC 6.8 7.5   Microbiology No results found for this or any previous visit (from the past 240 hours).   Time coordinating discharge: 35 minutes  SIGNED:   Erick Blinks, DO [facility] 12/29/2023, 10:00 AM  If 7PM-7AM, please contact  night-coverage [email or web address]

## 2023-12-29 NOTE — Progress Notes (Signed)
 Pt. Tolerated 100% of breakfast, with no c/o pain or nausea.

## 2023-12-29 NOTE — Care Management Obs Status (Signed)
 MEDICARE OBSERVATION STATUS NOTIFICATION   Patient Details  Name: [name] MRN: [id number] Date of Birth: 11-18-1940   Medicare Observation Status Notification Given:  Yes    Aquarius Tremper Marsh Dolly, LCSW 12/29/2023, 10:22 AM

## 2023-12-29 NOTE — Progress Notes (Signed)
 [facility] Progress Note     Subjective: Patient seen and examined.  She is resting comfortably in bed.  She had 2 bowel movements overnight.  She was not able to eat anything overnight, as the nurse was confused by her diet order.  Patient states that she is hungry and would like to eat something.  She also confirms passing flatus.  She denies any further abdominal pain like what she presented to the emergency department with.  Objective: Vital signs in last 24 hours: Temp:  [97.8 F (36.6 C)-98.1 F (36.7 C)] 98 F (36.7 C) (02/22 0412) Pulse Rate:  [67-80] 78 (02/22 0412) Resp:  [12-24] 19 (02/22 0412) BP: (140-172)/(52-80) 140/52 (02/22 0412) SpO2:  [84 %-98 %] 95 % (02/22 0412) Weight:  [99.2 kg-106.6 kg] 99.2 kg (02/21 2214) Last BM Date : 12/27/23  Intake/Output from previous day: 02/21 0701 - 02/22 0700 In: 500 [IV Piggyback:500] Out: -  Intake/Output this shift: No intake/output data recorded.  General appearance: alert, cooperative, and no distress GI: Abdomen soft, nondistended, no percussion tenderness, nontender to palpation; no rigidity, guarding, rebound tenderness; no palpable abdominal wall hernias in the left lower quadrant, area nontender to palpation  Lab Results:  Recent Labs    12/28/23 1049 12/29/23 0312  WBC 6.8 7.5  HGB 15.1* 13.6  HCT 42.6 39.6  PLT 203 188   BMET Recent Labs    12/28/23 1049 12/29/23 0312  NA 139 138  K 4.0 3.8  CL 100 105  CO2 29 26  GLUCOSE 103* 90  BUN 15 12  CREATININE 0.61 0.44  CALCIUM 9.7 8.9   PT/INR Recent Labs    12/28/23 1049  LABPROT 14.0  INR 1.1    Studies/Results: CT ABDOMEN PELVIS WO CONTRAST Addendum Date: 12/28/2023 ADDENDUM REPORT: 12/28/2023 18:58 ADDENDUM: Upon further review, the hernia has been reduced from CT scan same day acquired at 12:51 p.m. There is now only fluid within the hernia sac. No bowel within the hernia sac. There is ovoid periphery calcified focus of  fat necrosis within the hernia sac . Findings conveyed toCATHERINE Kyera Felan on 12/28/2023  at18:57. Electronically Signed   By: Genevive Bi M.D.   On: 12/28/2023 18:58   Result Date: 12/28/2023 CLINICAL DATA:  Hiatal hernia suspected EXAM: CT ABDOMEN AND PELVIS WITHOUT CONTRAST TECHNIQUE: Multidetector CT imaging of the abdomen and pelvis was performed following the standard protocol without IV contrast. RADIATION DOSE REDUCTION: This exam was performed according to the departmental dose-optimization program which includes automated exposure control, adjustment of the mA and/or kV according to patient size and/or use of iterative reconstruction technique. COMPARISON:  None Available. FINDINGS: Lower chest: Lung bases are clear. Hepatobiliary: No focal hepatic lesion. Normal gallbladder. No biliary duct dilatation. Common bile duct is normal. Pancreas: Pancreas is normal. No ductal dilatation. No pancreatic inflammation. Spleen: Normal spleen Adrenals/urinary tract: Adrenal glands and kidneys are normal. Bilateral simple fluid attenuation renal cysts. No follow-up recommended. The ureters and bladder normal. Stomach/Bowel: Moderate size hiatal hernia. Approximately 1/3 third of the stomach above the hemidiaphragms posterior the heart. Sliding-type hiatal hernia. Duodenum normal. No evidence of bowel obstruction. Mild haziness within the small bowel mesentery of the LEFT lower quadrant (image 58/2). There is a lateral abdominal wall hernia along the lateral margin of the LEFT rectus muscle in the LEFT lower quadrant. This is best seen on sagittal series (image [id number] series 5). Loop of small bowel is trapped within this hernia with small amount of fluid  in the hernia sac. Vascular/Lymphatic: Abdominal aorta is normal caliber with atherosclerotic calcification. There is no retroperitoneal or periportal lymphadenopathy. No pelvic lymphadenopathy. Reproductive: Post hysterectomy.  Adnexa unremarkable Other: No  free fluid. Musculoskeletal: No aggressive osseous lesion. IMPRESSION: Lateral abdominal wall hernia in the LEFT lower quadrant contains a loop of entrapped small bowel with potential obstruction. Recommend surgical consultation. Electronically Signed: By: Genevive Bi M.D. On: 12/28/2023 18:41   CT ABDOMEN PELVIS W CONTRAST Result Date: 12/28/2023 CLINICAL DATA:  Left lower quadrant and flank pain. Tenderness to palpation. EXAM: CT ABDOMEN AND PELVIS WITH CONTRAST TECHNIQUE: Multidetector CT imaging of the abdomen and pelvis was performed using the standard protocol following bolus administration of intravenous contrast. RADIATION DOSE REDUCTION: This exam was performed according to the departmental dose-optimization program which includes automated exposure control, adjustment of the mA and/or kV according to patient size and/or use of iterative reconstruction technique. CONTRAST:  OMNIPAQUE IOHEXOL 300 MG/ML  SOLN COMPARISON:  None Available. FINDINGS: Lower Chest: No acute findings. Elevation of right hemidiaphragm noted. Hepatobiliary: No suspicious hepatic masses identified. Prior cholecystectomy. No evidence of biliary obstruction. Pancreas:  No mass or inflammatory changes. Spleen: Within normal limits in size and appearance. Adrenals/Urinary Tract: No suspicious masses identified. No evidence of ureteral calculi or hydronephrosis. Stomach/Bowel: Left lower quadrant spigelian hernia is seen containing a dilated small bowel loop and small amount of fluid. Moderate dilatation of proximal small bowel loops is seen, consistent with small-bowel obstruction. No evidence of bowel wall thickening or pneumatosis. A moderate hiatal hernia is seen. Normal appendix visualized. Diverticulosis is seen mainly involving the sigmoid colon, however there is no evidence of diverticulitis. Vascular/Lymphatic: No pathologically enlarged lymph nodes. No acute vascular findings. Reproductive: Prior hysterectomy noted.  Adnexal regions are unremarkable in appearance. Other:  None. Musculoskeletal:  No suspicious bone lesions identified. IMPRESSION: Small-bowel obstruction due to left lower quadrant Spigelian hernia. No signs of bowel ischemia identified. Moderate hiatal hernia. Colonic diverticulosis, without radiographic evidence of diverticulitis. Electronically Signed   By: Danae Orleans M.D.   On: 12/28/2023 14:28    Anti-infectives: Anti-infectives (From admission, onward)    None       Assessment/Plan:  Patient is an 83 year old female who was admitted after reduction of an incarcerated left spigelian hernia that contained a loop of small bowel and was causing a partial small bowel obstruction.  CT demonstrated complete reduction of bowel from her spigelian hernia.  Imaging and blood work evaluated by myself.  -Patient has had resolution of her pain and return of bowel function -Regular diet has been ordered -If patient is able to tolerate her diet and her pain remains controlled, patient is stable for discharge home from a surgical standpoint. -Recommend continuing bowel regimen outpatient -I discussed the option to add her on for an elective outpatient robotic assisted laparoscopic left spigelian hernia repair with mesh on 2/28.  Patient would like to think about her options.  Advised that she should call my office on Monday morning to let us know how she would like to proceed with surgery. -I will have my office reach out to both her cardiologist and pulmonologist to verify that there are no significant contraindications to surgery from their standpoint -Appreciate hospitalist recommendations   LOS: 0 days    Andersson Larrabee A [place] Burgin 12/29/2023

## 2023-12-31 ENCOUNTER — Telehealth: Payer: Self-pay | Admitting: *Deleted

## 2023-12-31 ENCOUNTER — Telehealth: Payer: Self-pay | Admitting: Internal Medicine

## 2023-12-31 ENCOUNTER — Encounter: Payer: Self-pay | Admitting: *Deleted

## 2023-12-31 DIAGNOSIS — K439 Ventral hernia without obstruction or gangrene: Secondary | ICD-10-CM

## 2023-12-31 NOTE — Telephone Encounter (Signed)
 Good Morning [place],  We have received a surgical clearance request for [name] for a robotic assisted hernia repair with mesh. They were seen recently in clinic on 12/05/2023.  She had a recent hospital admission for abdominal pain.  Can you please comment on surgical clearance for upcoming hernia or surgery. Please forward you guidance and recommendations to P [facility] DIV PREOP   Thank you, Robin Searing, NP

## 2023-12-31 NOTE — Telephone Encounter (Signed)
 Appointment with pulmonology scheduled for 01/02/2024 @ 4:15 pm.

## 2023-12-31 NOTE — Telephone Encounter (Signed)
 Would need to see her first to clear her as she would be high risk

## 2023-12-31 NOTE — Telephone Encounter (Addendum)
 Hi Ernest/preop, [place] is out this week so will not be able to answer until she returns 3/4. It looks like surgery will be occurring before this date. Therefore would follow standard pre-op protocol to evaluate. Thanks.

## 2023-12-31 NOTE — Telephone Encounter (Signed)
 Risk stratification sent to cardiology and pulmonology.   Surgery scheduled.

## 2023-12-31 NOTE — Telephone Encounter (Signed)
   Patient Name: [name]  DOB: 1941-05-12 MRN: [id number]  Primary Cardiologist: Nona Dell, MD  Chart reviewed as part of pre-operative protocol coverage. Given past medical history and time since last visit, based on ACC/AHA guidelines, [name] is at acceptable risk for the planned procedure without further cardiovascular testing.   She was able to complete 4 METS of activity without any difficulty.  She still works daily from 8-1 and was advised to hold her Jardiance 3 days prior to procedure and guidance on holding aspirin will come from prescribing provider.  The patient was advised that if she develops new symptoms prior to surgery to contact our office to arrange for a follow-up visit, and she verbalized understanding.  I will route this recommendation to the requesting party via Epic fax function and remove from pre-op pool.  Please call with questions.  Napoleon Form, Leodis Rains, NP 12/31/2023, 2:25 PM

## 2023-12-31 NOTE — Telephone Encounter (Signed)
  Patient: [name]  Date of Birth: [birth year]   Cardiologist: Sandrea Hughs, MD Last OV:  12/05/2023  Pre-Operative Pulmonology Risk Stratification    General Surgeon:  Theophilus Kinds, DO  Type of surgery being performed: XI Robotic Assisted Laparoscopic Spigelian Hernia Repair W/ Mesh, Left, 5 CM  Anesthesia Type: (none, local, MAC, general)  General  Scheduled Date:  01/04/2024  Type of Clearance Required:  (medical vs pharmacology)  Medical: DOE (dyspnea on exertion)  Chronic respiratory failure with hypoxia  Oxygen use Q HS  **CONFIDENTIALITY NOTICE** This message is intended only for the use of the individual or entity to which it is addressed, and may contain information that is privileged, confidential, and exempt from disclosure under applicable law. If the reader of this message is not the intended recipient, you are hereby notified that any dissemination, distribution, or copying of this communication is strictly prohibited. If you have received this communication in error, please notify our Compliance & Privacy Helpline at [phone].

## 2023-12-31 NOTE — Telephone Encounter (Signed)
 [name] states patient needs surgical clearance this week. Patient of Dr. Sherene Sires. Needs [place] office. [place] phone number is [phone].

## 2023-12-31 NOTE — Telephone Encounter (Signed)
   Pre-operative Risk Assessment    Patient Name: [name]  DOB: 23-Dec-1940 MRN: [id number]   Date of last office visit: 12/05/2023 Date of next office visit: None  Request for Surgical Clearance    Procedure:   XI Robotic Assisted Laparoscopic Spigelian Hernia Repair W/ Mesh 5 CM  Date of Surgery:  Clearance 01/04/24                                 Surgeon:  Dr. Theophilus Kinds Surgeon's Group or Practice Name:  [facility] Phone number:  [phone] Fax number:  [phone]   Type of Clearance Requested:   - Medical  - Pharmacy:  Hold [facility] X 3 days  Asprin   Type of Anesthesia:  General    Additional requests/questions:    Signed, Emmit Pomfret   12/31/2023, 11:42 AM

## 2023-12-31 NOTE — Telephone Encounter (Signed)
-----   Message from CATHERINE A PAPPAYLIOU sent at 12/29/2023 12:12 PM EST ----- Regarding: Schedule for outpatient surgery Can you please schedule this patient for robotic assisted laparoscopic spigelian hernia repair with mesh on 2/28 for need to follow my other case?  I saw her in the hospital, so she does not need to see me in office and will likely only need a preop phone call.  The surgery will be outpatient and general anesthesia.  Also, can we reach out to both Dr. Diona Browner (cardiologist) and Dr. Sherene Sires (pulmonology) this week to verify there is nothing else that she would need prior to surgery?  She saw both of them in [place] of last year, so I do not anticipate that she will need to see them again.  I just want [place] to reach out to them to make sure they do not have any other recommendations  Thanks, Florentina Addison

## 2024-01-01 ENCOUNTER — Inpatient Hospital Stay: Payer: HMO

## 2024-01-01 ENCOUNTER — Encounter: Payer: Self-pay | Admitting: Internal Medicine

## 2024-01-01 ENCOUNTER — Ambulatory Visit: Payer: HMO | Admitting: Internal Medicine

## 2024-01-01 VITALS — BP 146/82 | HR 72 | Ht 64.0 in | Wt 223.0 lb

## 2024-01-01 DIAGNOSIS — J9611 Chronic respiratory failure with hypoxia: Secondary | ICD-10-CM | POA: Diagnosis not present

## 2024-01-01 DIAGNOSIS — J8489 Other specified interstitial pulmonary diseases: Secondary | ICD-10-CM

## 2024-01-01 DIAGNOSIS — R0609 Other forms of dyspnea: Secondary | ICD-10-CM

## 2024-01-01 NOTE — Progress Notes (Deleted)
 [name], female    DOB: 04-Dec-1940    MRN: [id number]   Brief patient profile:  68  yowf  never smoker  referred to pulmonary clinic in [place]  05/23/2022 by Dr Diona Browner  for unexplained hypoxemia dating back to time of  abd hysterectomy in 2012 at wt 271 and supplied with 02 but rarely used it but noted difficulty from [facility] parking into her work around summer 2022      History of Present Illness  05/23/2022  Pulmonary/ 1st office eval/ [name] / Sales executive Complaint  Patient presents with   Consult    Patient was seeing Dr. Juanetta Stafford has been on O2 for years- prn during day and sometimes at night. She states her breathing has worsened over last year.   Dyspnea:  50 ft and 02 drops Cough: just hoarse  Sleep: 30 degree on side - immediate smothering if lies flat  SABA use: not sure they help  Vax x 2 for covid / never infected  Rec Try using 2lpm at bedtime for now and we will do an overnight study to be sure that's enough  Make sure you check your oxygen saturation  AT  your highest level of activity (not after you stop)   to be sure it stays over 90%  We will have the [employer] rep call you re supplying you with a portable oxygen concentrator but it may not be enough to keep your 02 sats above 90% which is the goal here. My office will be contacting you by phone for referral for High resolution CT of chest Continue Nexium 40 mg Take 30-60 min before first meal of the day  GERD diet  We will schedule a return to my office in [place] with PFTs same day.     06/14/2023  f/u ov/[place] office/[name] re: PF maint on 02   Chief Complaint  Patient presents with   DOE  Dyspnea:  using rollator x 15 min circular drive stopping maybe once on 3lpm cont lowest 90%   Cough: min dry assoc pnd Sleeping: 45 degrees no resp cc  SABA use: none  02: 3lpm hs and prn at rest up to 3lpm cont with ex  Rec Pantoprazole (protonix) 40 mg   Take  30-60 min before first meal of the  day and add Pepcid (famotidine)  20 mg after supper until return to office - this is the best way to tell whether stomach acid is contributing to your problem.   GERD diet reviewed, bed blocks rec  For itching /sneezing / cough zyrtec 10 mg in evening as needed - works x 24 h Make sure you check your oxygen saturation  AT  your highest level of activity (not after you stop)   to be sure it stays over 90%  Please schedule a follow up visit in 3 months but call sooner if needed    09/13/2023  f/u ov/[place] office/[name] re: PF  maint on prn ex 02   Chief Complaint  Patient presents with   Shortness of Breath   Dyspnea:  circular drive on rollator sats 52% on RA Cough: none  Sleeping: electric hob 45 degrees s    resp cc  SABA use: none  02: 3lpm hs  Rec     01/02/2024  f/u ov/ office/[name] re: *** maint on ***  No chief complaint on file.   Dyspnea:  *** Cough: *** Sleeping: ***   resp cc  SABA use: ***  02: ***  Lung cancer screening: ***   No obvious day to day or daytime variability or assoc excess/ purulent sputum or mucus plugs or hemoptysis or cp or chest tightness, subjective wheeze or overt sinus or hb symptoms.    Also denies any obvious fluctuation of symptoms with weather or environmental changes or other aggravating or alleviating factors except as outlined above   No unusual exposure hx or h/o childhood pna/ asthma or knowledge of premature birth.  Current Allergies, Complete Past Medical History, Past Surgical History, Family History, and Social History were reviewed in [employer] record.  ROS  The following are not active complaints unless bolded Hoarseness, sore throat, dysphagia, dental problems, itching, sneezing,  nasal congestion or discharge of excess mucus or purulent secretions, ear ache,   fever, chills, sweats, unintended wt loss or wt gain, classically pleuritic or exertional cp,  orthopnea pnd or arm/hand swelling  or  leg swelling, presyncope, palpitations, abdominal pain, anorexia, nausea, vomiting, diarrhea  or change in bowel habits or change in bladder habits, change in stools or change in urine, dysuria, hematuria,  rash, arthralgias, visual complaints, headache, numbness, weakness or ataxia or problems with walking or coordination,  change in mood or  memory.        No outpatient medications have been marked as taking for the 01/02/24 encounter (Appointment) with [name], MD.                Past Medical History:  Diagnosis Date   Arthritis    Chronic diastolic heart failure (HCC)    Essential hypertension    GERD (gastroesophageal reflux disease)    Gout    Hiatal hernia    History of endometrial cancer    Sp hysterectomy 2012   Ischemic colitis (HCC) 2016   Myeloproliferative disorder, JAK-2 positive    Obesity    Pneumonia    Polycythemia vera(238.4)    TIA (transient ischemic attack)        Objective:    Wts  01/02/2024        ***  09/13/2023        230   06/14/2023         230   06/26/22 240 lb 3.2 oz (109 kg)  05/23/22 244 lb (110.7 kg)  05/18/22 238 lb 6.4 oz (108.1 kg)    Vital signs reviewed  01/02/2024  - Note at rest 02 sats  ***% on ***   General appearance:    ***   3/6 sem slt increase in P2, and  1-2 + pitting both LE  edema / no hose ***          Assessment

## 2024-01-01 NOTE — Assessment & Plan Note (Signed)
 Onset 2012 p abd hysterectomy at wt 272  - Echo  09/13/21 1. Left ventricular ejection fraction, by estimation, is 65 to 70%. The  left ventricle has normal function.  There is moderate left ventricular hypertrophy.   2. Right ventricular systolic function is normal. The right ventricular  size is normal. There is severely elevated pulmonary artery systolic  pressure.   3. Left atrial size was severely dilated.   4. The mitral valve is abnormal. No evidence of mitral valve  regurgitation. Mild to moderate mitral stenosis. Moderate mitral annular  calcification.   5. The tricuspid valve is abnormal.   6. The aortic valve is tricuspid. There is moderate calcification of the  aortic valve. There is moderate thickening of the aortic valve. Aortic  valve regurgitation is not visualized. Mild aortic valve stenosis. Aortic  valve mean gradient measures 10.0  mmHg. Aortic valve peak gradient measures 22.7 mmHg. Aortic valve area, by  VTI measures 2.11 cm   7. The inferior vena cava is dilated in size with >50% respiratory  variability, suggesting right atrial pressure of 8 mmHg.  8.Right Atrium: Right atrial size was normal in size -  PFT's  03/23/11   FEV1 1.31 (61 % ) ratio 0.71  p 0 % improvement from saba p ? prior to study with DLCO  19.4 (73% %)  and FV curve min concave and ERV  370 cc at wt 270   - PFT's  06/26/2022   FEV1 1.11(62 % ) ratio 0.88  p 0 % improvement from saba p nothing prior to study with DLCO  13.73 (76%)  and FV curve nl  and ERV 150 (44%)  at wt 240 - Echo 08/07/23  no significant change x for PAS down to 46 from 08/2022   Actually improved a bit from prior eval and still walking into work and doing her circular drive s 02 so she should be ok for very limited hernia surgery with the caveat that in the event of complications she does not want  to be maintained on long term vent.   Discussed in detail all the  indications, usual  risks and alternatives  relative to the  benefits with patient who agrees to proceed with Rx as outlined.

## 2024-01-01 NOTE — Progress Notes (Addendum)
 [name], female    DOB: [birth year]    MRN: [id number]   Brief patient profile:  89  yowf  never smoker  referred to pulmonary clinic in [place]  05/23/2022 by Dr [name]  for unexplained hypoxemia dating back to time of  abd hysterectomy in 2012 at wt 271 and supplied with 02 but rarely used it but noted difficulty from [facility] parking into her work around summer 2022      History of Present Illness  05/23/2022  Pulmonary/ 1st office eval/ [name] / Sales executive Complaint  Patient presents with   Consult    Patient was seeing Dr. Juanetta Stafford has been on O2 for years- prn during day and sometimes at night. She states her breathing has worsened over last year.   Dyspnea:  50 ft and 02 drops Cough: just hoarse  Sleep: 30 degree on side - immediate smothering if lies flat  SABA use: not sure they help  Vax x 2 for covid / never infected  Rec Try using 2lpm at bedtime for now and we will do an overnight study to be sure that's enough  Make sure you check your oxygen saturation  AT  your highest level of activity (not after you stop)   to be sure it stays over 90%  We will have the [employer] rep call you re supplying you with a portable oxygen concentrator but it may not be enough to keep your 02 sats above 90% which is the goal here. My office will be contacting you by phone for referral for High resolution CT of chest Continue Nexium 40 mg Take 30-60 min before first meal of the day  GERD diet  We will schedule a return to my office in [place] with PFTs same day.     06/14/2023  f/u ov/[name] office/[name] re: PF maint on 02   Chief Complaint  Patient presents with   DOE  Dyspnea:  using rollator x 15 min circular drive stopping maybe once on 3lpm cont lowest 90%   Cough: min dry assoc pnd Sleeping: 45 degrees no resp cc  SABA use: none  02: 3lpm hs and prn at rest up to 3lpm cont with ex  Rec Pantoprazole (protonix) 40 mg   Take  30-60 min before first meal of the  day and add Pepcid (famotidine)  20 mg after supper until return to office - this is the best way to tell whether stomach acid is contributing to your problem.   GERD diet reviewed, bed blocks rec  For itching /sneezing / cough zyrtec 10 mg in evening as needed - works x 24 h Make sure you check your oxygen saturation  AT  your highest level of activity (not after you stop)   to be sure it stays over 90%  Please schedule a follow up visit in 3 months but call sooner if needed    09/13/2023  f/u ov/[place] office/[name] re: PF  maint on prn ex 02   Chief Complaint  Patient presents with   Shortness of Breath   Dyspnea:  circular drive on rollator sats 40% on RA Cough: none  Sleeping: electric hob 45 degrees s    resp cc  SABA use: none  02: 3lpm hs  Rec No change in medications Make sure you check your oxygen saturation  AT  your highest level of activity (not after you stop)   to be sure it stays over 90% and adjust  02  flow upward to maintain this level if needed but remember to turn it back to previous settings when you stop (to conserve your supply).      01/01/2024  f/u ov/[place] office/[name] re: PF since ? 2012   maint on GERD  rx /02 and now needing clearance for hernia repair  Chief Complaint  Patient presents with   Follow-up    Needing clearance for surgery   Dyspnea:  usual walk around the circle x  up to 30 min RA = low 90s usually @ very slow pace  Cough: none  Sleeping: 45 degrees electric bed s resp cc  SABA use: none  02: 3lpm bedtime / none at rest and up to 4lpm walking   No obvious day to day or daytime variability or assoc excess/ purulent sputum or mucus plugs or hemoptysis or cp or chest tightness, subjective wheeze or overt sinus or hb symptoms.    Also denies any obvious fluctuation of symptoms with weather or environmental changes or other aggravating or alleviating factors except as outlined above   No unusual exposure hx or h/o childhood pna/ asthma or  knowledge of premature birth.  Current Allergies, Complete Past Medical History, Past Surgical History, Family History, and Social History were reviewed in [employer] record.  ROS  The following are not active complaints unless bolded Hoarseness, sore throat, dysphagia, dental problems, itching, sneezing,  nasal congestion or discharge of excess mucus or purulent secretions, ear ache,   fever, chills, sweats, unintended wt loss or wt gain, classically pleuritic or exertional cp,  orthopnea pnd or arm/hand swelling  or leg swelling, presyncope, palpitations, abdominal pain, anorexia, nausea, vomiting, diarrhea  or change in bowel habits or change in bladder habits, change in stools or change in urine, dysuria, hematuria,  rash, arthralgias, visual complaints, headache, numbness, weakness or ataxia or problems with walking or coordination,  change in mood or  memory.        Current Meds  Medication Sig   acetaminophen (TYLENOL) 650 MG CR tablet Take 1,300 mg by mouth every 8 (eight) hours as needed for pain.   aspirin EC 81 MG tablet Take 81 mg by mouth at bedtime.    atorvastatin (LIPITOR) 40 MG tablet Take 1 tablet (40 mg total) by mouth daily. (Patient taking differently: Take 40 mg by mouth every evening.)   bisacodyl (DULCOLAX) 10 MG suppository Place 1 suppository (10 mg total) rectally daily as needed for moderate constipation or severe constipation.   carvedilol (COREG) 12.5 MG tablet Take 0.5 tablets (6.25 mg total) by mouth 2 (two) times daily with a meal. Take 1/2 tablet by 2 times daily   cloNIDine (CATAPRES) 0.3 MG tablet Take 0.15 mg by mouth 2 (two) times daily.   diltiazem (CARDIZEM CD) 180 MG 24 hr capsule Take 180 mg by mouth daily.    empagliflozin (JARDIANCE) 10 MG TABS tablet Take 1 tablet (10 mg total) by mouth daily before breakfast.   esomeprazole (NEXIUM) 40 MG capsule Take 1 capsule (40 mg total) by mouth every morning.   famotidine (PEPCID) 20 MG  tablet One after supper   furosemide (LASIX) 20 MG tablet Take 20 mg by mouth daily. Take with 40 mg to get 60 mg dose daily   furosemide (LASIX) 40 MG tablet Take 1 tablet (40 mg total) by mouth daily.   hydroxyurea (HYDREA) 500 MG capsule Take 2 capsules (1,000 mg total) by mouth every Monday, Wednesday, and Friday AND 3 capsules (1,500  mg total) every Tuesday, Thursday, Saturday, and Sunday. You can split to 1500 mg dose into 2 tablets in the AM and 1 tablet PM..   isosorbide mononitrate (IMDUR) 30 MG 24 hr tablet TAKE 1/2 (ONE-HALF) TABLET BY MOUTH AT BEDTIME   letrozole (FEMARA) 2.5 MG tablet Take 1 tablet by mouth once daily   losartan (COZAAR) 100 MG tablet Take 100 mg by mouth every evening.    Multiple Vitamins-Minerals (PRESERVISION AREDS PO) Take 1 capsule by mouth 2 (two) times daily at 10 AM and 5 PM.   polyethylene glycol (MIRALAX / GLYCOLAX) 17 g packet Take 17 g by mouth daily.   senna-docusate (SENOKOT-S) 8.6-50 MG tablet Take 1 tablet by mouth 2 (two) times daily.   Simethicone (GAS RELIEF PO) Take 1 tablet by mouth daily.   spironolactone (ALDACTONE) 25 MG tablet Take 12.5 mg by mouth daily.   Vitamin D, Ergocalciferol, (DRISDOL) 1.25 MG (50000 UNIT) CAPS capsule TAKE 1 CAPSULE BY MOUTH EVERY 14 DAYS                Past Medical History:  Diagnosis Date   Arthritis    Chronic diastolic heart failure (HCC)    Essential hypertension    GERD (gastroesophageal reflux disease)    Gout    Hiatal hernia    History of endometrial cancer    Sp hysterectomy 2012   Ischemic colitis (HCC) 2016   Myeloproliferative disorder, JAK-2 positive    Obesity    Pneumonia    Polycythemia vera(238.4)    TIA (transient ischemic attack)        Objective:    Wts  01/01/2024        223  09/13/2023        230   06/14/2023         23 0   06/26/22 240 lb 3.2 oz (109 kg)  05/23/22 244 lb (110.7 kg)  05/18/22 238 lb 6.4 oz (108.1 kg)    Vital signs reviewed  01/01/2024  - Note at rest  02 sats  92 % on 4lpm POC    General appearance:    robust but elderly wf/ using rollator      HEENT : Oropharynx  clear  top dentures/ bottom implants       NECK :  without  apparent JVD/ palpable Nodes/TM    LUNGS: no acc muscle use,  Nl contour chest with minimal coarsening of BS s true crackles and no cough on inspiration   CV:  RRR  no s3  and 2-3/6 SEM slt  increase in P2, and  2 +  pitting both LEs L>R   ABD:  quite obese soft and nontender   MS:  Gait slow and a bit unsteady/ using rollator   ext warm without deformities Or obvious joint restrictions  calf tenderness, cyanosis or clubbing    SKIN: warm and dry without lesions    NEURO:  alert, approp, nl sensorium with  no motor or cerebellar deficits apparent.           Assessment

## 2024-01-01 NOTE — Assessment & Plan Note (Addendum)
 See HRCT  06/19/22   - prob NSIP /  also large HH and  PH   Declined referral to PF clinic and Actually seems a bit better since last ov so no change rx needed > continue gerd rx empirically.  Each maintenance medication was reviewed in detail including emphasizing most importantly the difference between maintenance and prns and under what circumstances the prns are to be triggered using an action plan format where appropriate.  Total time for H and P, chart review, counseling,  directly observing portions of ambulatory 02 saturation study/ and generating customized AVS unique to this office visit / same day charting  = 42 min for    refractory respiratory symptoms of uncertain etiology and high risk preop eval.

## 2024-01-01 NOTE — Assessment & Plan Note (Signed)
 Started on 02 post op in 2012  - 05/23/2022   Walked on 3lpm  x  3  lap(s) =  approx 450  ft  @ slow/ rollator  pace, stopped due to end of study with lowest 02 sats 90%   - ONO on 2lpm 05/31/22  desat < 89% x 6 h so rec 3lpm and repeat on 3lpm 06/06/2022 >>> pt refused  So ok to leave on 2lpm for now - 06/17/22  ono on 3lpm  Only 7 min desats > no change rx - 06/14/2023   Walked on 3lpm cont  x  2  lap(s) =  approx 300  ft  @ mod fast for rollator pace, stopped due to sob with lowest 02 sats 90%   - 01/01/2024   Walked on RA  x  3  lap(s) =  approx 450  ft  @ slow / rollator pace, stopped due to end of study  with lowest 02 sats 88% transiently on 2nd lap so rec 02 with higher levels of exertion with target always > 90%

## 2024-01-01 NOTE — Telephone Encounter (Signed)
 Pt is coming in today

## 2024-01-01 NOTE — Telephone Encounter (Signed)
 Pt has an appt on 01/02/24 will note this at her appt .

## 2024-01-01 NOTE — Patient Instructions (Addendum)
 Make sure you check your oxygen saturation  AT  your highest level of activity (not after you stop)   to be sure it stays over 90% and adjust  02 flow upward to maintain this level if needed but remember to turn it back to previous settings when you stop (to conserve your supply).   You are cleared for surgery on your hernia but be sure all your family members are on board with your wishes as this is risky surgery given your lung and heart issues.   Keep previous appointments

## 2024-01-02 ENCOUNTER — Other Ambulatory Visit (HOSPITAL_COMMUNITY): Payer: HMO

## 2024-01-02 ENCOUNTER — Encounter (HOSPITAL_COMMUNITY)
Admission: RE | Admit: 2024-01-02 | Discharge: 2024-01-02 | Disposition: A | Discharge status: Home / Self Care | Payer: HMO | Source: Ambulatory Visit | Attending: Surgery | Admitting: Surgery

## 2024-01-02 ENCOUNTER — Ambulatory Visit: Payer: HMO | Admitting: Internal Medicine

## 2024-01-02 ENCOUNTER — Other Ambulatory Visit: Payer: Self-pay

## 2024-01-02 ENCOUNTER — Encounter (HOSPITAL_COMMUNITY): Payer: Self-pay

## 2024-01-02 HISTORY — DX: Unspecified macular degeneration: H35.30

## 2024-01-04 ENCOUNTER — Encounter (HOSPITAL_COMMUNITY): Payer: Self-pay | Admitting: Surgery

## 2024-01-04 ENCOUNTER — Ambulatory Visit (HOSPITAL_COMMUNITY): Payer: Self-pay | Admitting: Certified Registered Nurse Anesthetist

## 2024-01-04 ENCOUNTER — Ambulatory Visit (HOSPITAL_BASED_OUTPATIENT_CLINIC_OR_DEPARTMENT_OTHER): Payer: Self-pay | Admitting: Certified Registered Nurse Anesthetist

## 2024-01-04 ENCOUNTER — Ambulatory Visit (HOSPITAL_COMMUNITY)
Admission: RE | Admit: 2024-01-04 | Discharge: 2024-01-04 | Disposition: A | Discharge status: Home / Self Care | Payer: HMO | Attending: Surgery | Admitting: Surgery

## 2024-01-04 ENCOUNTER — Encounter (HOSPITAL_COMMUNITY)
Admission: RE | Disposition: A | Discharge status: Home / Self Care | Payer: Self-pay | Source: Home / Self Care | Attending: Surgery

## 2024-01-04 DIAGNOSIS — D1779 Benign lipomatous neoplasm of other sites: Secondary | ICD-10-CM | POA: Diagnosis not present

## 2024-01-04 DIAGNOSIS — I5032 Chronic diastolic (congestive) heart failure: Secondary | ICD-10-CM | POA: Diagnosis not present

## 2024-01-04 DIAGNOSIS — I110 Hypertensive heart disease with heart failure: Secondary | ICD-10-CM | POA: Diagnosis not present

## 2024-01-04 DIAGNOSIS — K219 Gastro-esophageal reflux disease without esophagitis: Secondary | ICD-10-CM | POA: Insufficient documentation

## 2024-01-04 DIAGNOSIS — K432 Incisional hernia without obstruction or gangrene: Secondary | ICD-10-CM

## 2024-01-04 DIAGNOSIS — K449 Diaphragmatic hernia without obstruction or gangrene: Secondary | ICD-10-CM | POA: Insufficient documentation

## 2024-01-04 DIAGNOSIS — K660 Peritoneal adhesions (postprocedural) (postinfection): Secondary | ICD-10-CM

## 2024-01-04 DIAGNOSIS — Z90710 Acquired absence of both cervix and uterus: Secondary | ICD-10-CM | POA: Diagnosis not present

## 2024-01-04 DIAGNOSIS — Z8579 Personal history of other malignant neoplasms of lymphoid, hematopoietic and related tissues: Secondary | ICD-10-CM | POA: Diagnosis not present

## 2024-01-04 DIAGNOSIS — D45 Polycythemia vera: Secondary | ICD-10-CM | POA: Diagnosis not present

## 2024-01-04 DIAGNOSIS — Z90722 Acquired absence of ovaries, bilateral: Secondary | ICD-10-CM | POA: Diagnosis not present

## 2024-01-04 DIAGNOSIS — K4090 Unilateral inguinal hernia, without obstruction or gangrene, not specified as recurrent: Secondary | ICD-10-CM

## 2024-01-04 DIAGNOSIS — Z79899 Other long term (current) drug therapy: Secondary | ICD-10-CM | POA: Diagnosis not present

## 2024-01-04 DIAGNOSIS — Z8542 Personal history of malignant neoplasm of other parts of uterus: Secondary | ICD-10-CM | POA: Insufficient documentation

## 2024-01-04 DIAGNOSIS — Z9079 Acquired absence of other genital organ(s): Secondary | ICD-10-CM | POA: Insufficient documentation

## 2024-01-04 DIAGNOSIS — E66813 Obesity, class 3: Secondary | ICD-10-CM | POA: Diagnosis not present

## 2024-01-04 DIAGNOSIS — Z9981 Dependence on supplemental oxygen: Secondary | ICD-10-CM | POA: Insufficient documentation

## 2024-01-04 DIAGNOSIS — J449 Chronic obstructive pulmonary disease, unspecified: Secondary | ICD-10-CM | POA: Diagnosis not present

## 2024-01-04 DIAGNOSIS — Z8673 Personal history of transient ischemic attack (TIA), and cerebral infarction without residual deficits: Secondary | ICD-10-CM | POA: Diagnosis not present

## 2024-01-04 DIAGNOSIS — K437 Other and unspecified ventral hernia with gangrene: Secondary | ICD-10-CM | POA: Diagnosis not present

## 2024-01-04 DIAGNOSIS — Z6838 Body mass index (BMI) 38.0-38.9, adult: Secondary | ICD-10-CM | POA: Insufficient documentation

## 2024-01-04 DIAGNOSIS — Z853 Personal history of malignant neoplasm of breast: Secondary | ICD-10-CM | POA: Insufficient documentation

## 2024-01-04 SURGERY — REPAIR, HERNIA, VENTRAL, ROBOT-ASSISTED
Anesthesia: General | Site: Abdomen

## 2024-01-04 MED ORDER — LIDOCAINE HCL (PF) 2 % IJ SOLN
INTRAMUSCULAR | Status: AC
  Filled 2024-01-04: qty 5

## 2024-01-04 MED ORDER — SODIUM CHLORIDE 0.9% FLUSH: 3.0000 mL | Freq: Two times a day (BID) | INTRAVENOUS | Status: DC

## 2024-01-04 MED ORDER — ACETAMINOPHEN 160 MG/5ML PO SOLN
960.0000 mg | Freq: Once | ORAL | Status: AC
  Filled 2024-01-04: qty 30

## 2024-01-04 MED ORDER — KETAMINE HCL 50 MG/5ML IJ SOSY
PREFILLED_SYRINGE | INTRAMUSCULAR | Status: DC | PRN
  Administered 2024-01-04 (×2): 10 mg via INTRAVENOUS

## 2024-01-04 MED ORDER — PROPOFOL 10 MG/ML IV BOLUS
INTRAVENOUS | Status: DC | PRN
  Administered 2024-01-04: 100 mg via INTRAVENOUS

## 2024-01-04 MED ORDER — FENTANYL CITRATE (PF) 100 MCG/2ML IJ SOLN
INTRAMUSCULAR | Status: DC | PRN
  Administered 2024-01-04: 25 ug via INTRAVENOUS
  Administered 2024-01-04: 50 ug via INTRAVENOUS
  Administered 2024-01-04: 25 ug via INTRAVENOUS

## 2024-01-04 MED ORDER — DEXAMETHASONE SODIUM PHOSPHATE 10 MG/ML IJ SOLN
INTRAMUSCULAR | Status: AC
  Filled 2024-01-04: qty 1

## 2024-01-04 MED ORDER — ASPIRIN EC 81 MG PO TBEC: 81.0000 mg | DELAYED_RELEASE_TABLET | Freq: Every day | ORAL | Status: AC

## 2024-01-04 MED ORDER — ACETAMINOPHEN 500 MG PO TABS
ORAL_TABLET | ORAL | Status: DC
Start: 2024-01-04 — End: 2024-01-04
  Filled 2024-01-04: qty 2

## 2024-01-04 MED ORDER — CHLORHEXIDINE GLUCONATE CLOTH 2 % EX PADS
6.0000 | MEDICATED_PAD | Freq: Once | CUTANEOUS | Status: DC
  Administered 2024-01-04: 6 via TOPICAL

## 2024-01-04 MED ORDER — KETAMINE HCL 50 MG/5ML IJ SOSY
PREFILLED_SYRINGE | INTRAMUSCULAR | Status: AC
Start: 2024-01-04 — End: ?
  Filled 2024-01-04: qty 5

## 2024-01-04 MED ORDER — STERILE WATER FOR IRRIGATION IR SOLN
Status: DC | PRN
  Administered 2024-01-04: 500 mL

## 2024-01-04 MED ORDER — SUGAMMADEX SODIUM 200 MG/2ML IV SOLN
INTRAVENOUS | Status: DC | PRN
  Administered 2024-01-04: 400 mg via INTRAVENOUS

## 2024-01-04 MED ORDER — SUCCINYLCHOLINE CHLORIDE 200 MG/10ML IV SOSY
PREFILLED_SYRINGE | INTRAVENOUS | Status: DC | PRN
  Administered 2024-01-04: 100 mg via INTRAVENOUS

## 2024-01-04 MED ORDER — ONDANSETRON HCL 4 MG/2ML IJ SOLN
INTRAMUSCULAR | Status: AC
  Filled 2024-01-04: qty 2

## 2024-01-04 MED ORDER — ACETAMINOPHEN 500 MG PO TABS
1000.0000 mg | ORAL_TABLET | Freq: Once | ORAL | Status: AC
  Administered 2024-01-04: 1000 mg via ORAL

## 2024-01-04 MED ORDER — CHLORHEXIDINE GLUCONATE 0.12 % MT SOLN
15.0000 mL | Freq: Once | OROMUCOSAL | Status: AC
  Administered 2024-01-04: 15 mL via OROMUCOSAL

## 2024-01-04 MED ORDER — EPHEDRINE SULFATE-NACL 50-0.9 MG/10ML-% IV SOSY
PREFILLED_SYRINGE | INTRAVENOUS | Status: DC | PRN
  Administered 2024-01-04: 5 mg via INTRAVENOUS

## 2024-01-04 MED ORDER — GLYCOPYRROLATE PF 0.2 MG/ML IJ SOSY
PREFILLED_SYRINGE | INTRAMUSCULAR | Status: DC | PRN
  Administered 2024-01-04 (×2): .1 mg via INTRAVENOUS

## 2024-01-04 MED ORDER — DEXAMETHASONE SODIUM PHOSPHATE 10 MG/ML IJ SOLN
INTRAMUSCULAR | Status: DC | PRN
  Administered 2024-01-04: 5 mg via INTRAVENOUS

## 2024-01-04 MED ORDER — ROCURONIUM BROMIDE 100 MG/10ML IV SOLN
INTRAVENOUS | Status: DC | PRN
  Administered 2024-01-04: 20 mg via INTRAVENOUS
  Administered 2024-01-04: 10 mg via INTRAVENOUS
  Administered 2024-01-04: 20 mg via INTRAVENOUS
  Administered 2024-01-04: 50 mg via INTRAVENOUS

## 2024-01-04 MED ORDER — CHLORHEXIDINE GLUCONATE 0.12 % MT SOLN
OROMUCOSAL | Status: DC
Start: 2024-01-04 — End: 2024-01-04
  Filled 2024-01-04: qty 15

## 2024-01-04 MED ORDER — FENTANYL CITRATE (PF) 100 MCG/2ML IJ SOLN
INTRAMUSCULAR | Status: AC
  Filled 2024-01-04: qty 2

## 2024-01-04 MED ORDER — SODIUM CHLORIDE 0.9% FLUSH: 3.0000 mL | INTRAVENOUS | Status: DC | PRN

## 2024-01-04 MED ORDER — ONDANSETRON HCL 4 MG/2ML IJ SOLN
INTRAMUSCULAR | Status: DC | PRN
  Administered 2024-01-04: 4 mg via INTRAVENOUS

## 2024-01-04 MED ORDER — SCOPOLAMINE 1 MG/3DAYS TD PT72
1.0000 | MEDICATED_PATCH | Freq: Once | TRANSDERMAL | Status: DC
  Administered 2024-01-04: 1.5 mg via TRANSDERMAL
  Filled 2024-01-04: qty 1

## 2024-01-04 MED ORDER — LACTATED RINGERS IV SOLN: INTRAVENOUS | Status: DC

## 2024-01-04 MED ORDER — HYDROMORPHONE HCL 1 MG/ML IJ SOLN: 0.2500 mg | INTRAMUSCULAR | Status: DC | PRN

## 2024-01-04 MED ORDER — OXYCODONE HCL 5 MG PO TABS: 5.0000 mg | ORAL_TABLET | Freq: Four times a day (QID) | ORAL | 0 refills | Status: DC | PRN

## 2024-01-04 MED ORDER — CEFAZOLIN SODIUM-DEXTROSE 2-4 GM/100ML-% IV SOLN
2.0000 g | INTRAVENOUS | Status: AC
  Administered 2024-01-04: 2 g via INTRAVENOUS

## 2024-01-04 MED ORDER — ACETAMINOPHEN 500 MG PO TABS: 1000.0000 mg | ORAL_TABLET | Freq: Four times a day (QID) | ORAL | 0 refills | Status: AC

## 2024-01-04 MED ORDER — LIDOCAINE HCL (CARDIAC) PF 100 MG/5ML IV SOSY
PREFILLED_SYRINGE | INTRAVENOUS | Status: DC | PRN
  Administered 2024-01-04: 80 mg via INTRAVENOUS

## 2024-01-04 MED ORDER — ROCURONIUM BROMIDE 10 MG/ML (PF) SYRINGE
PREFILLED_SYRINGE | INTRAVENOUS | Status: AC
  Filled 2024-01-04: qty 10

## 2024-01-04 MED ORDER — PHENYLEPHRINE HCL-NACL 20-0.9 MG/250ML-% IV SOLN
INTRAVENOUS | Status: DC | PRN
  Administered 2024-01-04: 30 ug/min via INTRAVENOUS

## 2024-01-04 MED ORDER — BUPIVACAINE HCL (PF) 0.5 % IJ SOLN
INTRAMUSCULAR | Status: DC | PRN
Start: 2024-01-04 — End: 2024-01-04
  Administered 2024-01-04: 30 mL

## 2024-01-04 MED ORDER — ORAL CARE MOUTH RINSE: 15.0000 mL | Freq: Once | OROMUCOSAL | Status: AC

## 2024-01-04 MED ORDER — BUPIVACAINE HCL (PF) 0.5 % IJ SOLN
INTRAMUSCULAR | Status: AC
  Filled 2024-01-04: qty 30

## 2024-01-04 MED ORDER — CEFAZOLIN SODIUM-DEXTROSE 2-4 GM/100ML-% IV SOLN
INTRAVENOUS | Status: AC
Start: 2024-01-04 — End: 2024-01-04
  Filled 2024-01-04: qty 100

## 2024-01-04 MED ORDER — SODIUM CHLORIDE 0.9 % IV SOLN: 12.5000 mg | INTRAVENOUS | Status: DC | PRN

## 2024-01-04 SURGICAL SUPPLY — 52 items
BLADE SURG 15 STRL LF DISP TIS (BLADE) ×3 IMPLANT
CHLORAPREP W/TINT 26 (MISCELLANEOUS) ×3 IMPLANT
COVER LIGHT HANDLE STERIS (MISCELLANEOUS) ×6 IMPLANT
COVER MAYO STAND XLG (MISCELLANEOUS) ×3 IMPLANT
COVER TIP SHEARS 8 DVNC (MISCELLANEOUS) ×3 IMPLANT
DEFOGGER SCOPE WARMER CLEARIFY (MISCELLANEOUS) ×3 IMPLANT
DERMABOND ADVANCED .7 DNX12 (GAUZE/BANDAGES/DRESSINGS) ×3 IMPLANT
DRAPE ARM DVNC X/XI (DISPOSABLE) ×9 IMPLANT
DRAPE COLUMN DVNC XI (DISPOSABLE) ×3 IMPLANT
DRIVER NDL MEGA SUTCUT DVNCXI (INSTRUMENTS) ×3 IMPLANT
DRIVER NDLE MEGA SUTCUT DVNCXI (INSTRUMENTS) ×3 IMPLANT
DRSG TEGADERM 4X4.75 (GAUZE/BANDAGES/DRESSINGS) ×3 IMPLANT
ELECT REM PT RETURN 9FT ADLT (ELECTROSURGICAL) ×3 IMPLANT
ELECTRODE REM PT RTRN 9FT ADLT (ELECTROSURGICAL) ×3 IMPLANT
FORCEPS BPLR 8 MD DVNC XI (FORCEP) ×3 IMPLANT
GAUZE SPONGE 2X2 8PLY STRL LF (GAUZE/BANDAGES/DRESSINGS) ×6 IMPLANT
GLOVE BIOGEL PI IND STRL 6.5 (GLOVE) ×6 IMPLANT
GLOVE BIOGEL PI IND STRL 7.0 (GLOVE) ×6 IMPLANT
GLOVE SURG SS PI 6.5 STRL IVOR (GLOVE) ×6 IMPLANT
GOWN STRL REUS W/TWL LRG LVL3 (GOWN DISPOSABLE) ×9 IMPLANT
KIT TURNOVER KIT A (KITS) ×3 IMPLANT
MANIFOLD NEPTUNE II (INSTRUMENTS) ×3 IMPLANT
MESH PROGRIP LAP SLF FIX 16X12 (Mesh General) IMPLANT
MESH VENTRALIGHT ST 4.5IN (Mesh General) IMPLANT
NDL HYPO 21X1.5 SAFETY (NEEDLE) ×3 IMPLANT
NDL INSUFFLATION 14GA 120MM (NEEDLE) ×3 IMPLANT
NEEDLE HYPO 21X1.5 SAFETY (NEEDLE) ×3 IMPLANT
NEEDLE INSUFFLATION 14GA 120MM (NEEDLE) ×3 IMPLANT
OBTURATOR OPTICAL STND 8 DVNC (TROCAR) ×3 IMPLANT
OBTURATOR OPTICALSTD 8 DVNC (TROCAR) ×3 IMPLANT
PACK LAP CHOLE LZT030E (CUSTOM PROCEDURE TRAY) ×3 IMPLANT
PENCIL HANDSWITCHING (ELECTRODE) ×3 IMPLANT
POSITIONER HEAD 8X9X4 ADT (SOFTGOODS) ×3 IMPLANT
SCISSORS MNPLR CVD DVNC XI (INSTRUMENTS) ×3 IMPLANT
SEAL UNIV 5-12 XI (MISCELLANEOUS) ×9 IMPLANT
SEALER VESSEL EXT DVNC XI (MISCELLANEOUS) IMPLANT
SET BASIN LINEN APH (SET/KITS/TRAYS/PACK) ×3 IMPLANT
SET TUBE SMOKE EVAC HIGH FLOW (TUBING) ×3 IMPLANT
SUT MNCRL AB 4-0 PS2 18 (SUTURE) ×3 IMPLANT
SUT STRATAFIX 0 PDS+ CT-2 23 (SUTURE) ×3 IMPLANT
SUT V-LOC 90 ABS 3-0 VLT V-20 (SUTURE) IMPLANT
SUT V-LOC BARB 180 2/0GR6 GS22 (SUTURE) ×3 IMPLANT
SUT VICRYL 0 UR6 27IN ABS (SUTURE) IMPLANT
SUTURE STRATFX 0 PDS+ CT-2 23 (SUTURE) ×3 IMPLANT
SUTURE V-LC BRB 180 2/0GR6GS22 (SUTURE) IMPLANT
SYR 30ML LL (SYRINGE) ×3 IMPLANT
SYS RETRIEVAL 5MM INZII UNIV (BASKET) ×3 IMPLANT
SYSTEM RETRIEVL 5MM INZII UNIV (BASKET) IMPLANT
TAPE TRANSPORE STRL 2 31045 (GAUZE/BANDAGES/DRESSINGS) ×3 IMPLANT
TRAY FOLEY W/BAG SLVR 16FR ST (SET/KITS/TRAYS/PACK) ×3 IMPLANT
V Loc 3-0 9 inch ×9 IMPLANT
WATER STERILE IRR 500ML POUR (IV SOLUTION) ×3 IMPLANT

## 2024-01-04 NOTE — Op Note (Signed)
 [facility] Operative Note  01/04/24  Preoperative Diagnosis: Left spigelian hernia   Postoperative Diagnosis: Left inguinal hernia, incisional ventral hernia, 2.5 cm in size, intra-abdominal adhesions   Procedure(s) Performed: Robotic assisted laparoscopic left inguinal hernia repair with mesh; robotic assisted laparoscopic primary repair of incisional ventral hernia, defect size 2.5 cm; robotic assisted laparoscopic lysis of adhesions   Surgeon: Theophilus Kinds, DO   Assistants: No qualified resident was available    Anesthesia: General endotracheal   Anesthesiologist: Dr. Leta Jungling   Specimens: None   Estimated Blood Loss: Minimal   Blood Replacement: None    Complications: None   Wound Class:Clean   Operative Indications: The patient had CT imaging consistent with a left spigelian hernia.  Patient was initially admitted to the hospital with incarceration of this hernia causing a small bowel obstruction.  The hernia was able to be reduced, and she was safely discharged home with plan for outpatient repair of this hernia.  Plan for robotic assisted hernia repair.  Patient is agreeable to surgery.  We discussed robotic assisted laparoscopic spigelian hernia repair and risk of bleeding, infection, issues with chronic pain post operatively, use of mesh, risk of recurrence, risk of injury to bowel or bladder, and risk of injury to cord structures for female patients.   Findings: Intra-abdominal adhesions to the midline Midline incisional ventral hernia, defect 2.5 cm in size Left inguinal hernia noted Round ligament divided Progrip Laparoscopic Mesh Hemostasis achieved   Procedure: The patient was taken to the operating room and placed supine. General endotracheal anesthesia was induced. Intravenous antibiotics were administered per protocol.  A foley catheter was placed and a orogastric tube positioned to decompress the stomach. The abdomen was prepared and  draped in the usual sterile fashion.  A time-out was completed verifying correct patient, procedure, site, positioning, and implant(s) and/or special equipment prior to beginning this procedure.  An incision was marked 20 cm above the pubic tubercle, slightly above the umbilicus. Veress needle inserted at the supraumbilical site.  Saline drop test noted to be positive with gradual increase in pressure after initiation of gas insufflation.  15 mm of pressure was achieved prior to removing the Veress needle and then placing a 8 mm port via the Optiview technique through the supraumbilical site that had been previously marked.  Inspection of the area afterwards noted no injury to the surrounding organs during insertion of the needle and the port.  2 port sites were marked 8 cm to the lateral sides of the initial port, and a 8 mm robotic port was placed on the left side, another 8 mm robotic port on the right side under direct supervision. The [employer] platform was then brought into the operative field and docked to the ports.  Examination of the abdominal cavity noted significant midline intra-abdominal adhesions.  These were taken down with a combination of blunt dissection, sharp dissection, and electrocautery.  While taking down the intra-abdominal adhesions, the patient was noted to have a 2.5 cm ventral hernia defect.  Once the adhesions were fully down, the pelvis was inspected, and there was a hernia defect in the left lower quadrant, similar in location to an inguinal hernia.  A peritoneal flap was created approximately 8 cm cephalad to the defect by using scissors with electrocautery.  Dissection was carried down towards the pubic tubercle, developing the myopectineal orifice view. Laterally the flap was carried towards the ASIS.  An indirect hernia sac was noted with a significant lipoma noted,  which carefully dissected away from the adjacent tissues to be fully reduced out of hernia cavity.  Any  bleeding was controlled with combination of electrocautery and manual pressure.  The lipoma that was associated with the hernia sac was excised, as there was an area of fat necrosis that was palpable with a grasper and noted on CT imaging.  Once the excess fat was removed, upon inspection of the hernia defect, the round ligament was noted to be going through the defect, consistent with an indirect inguinal hernia.  The round ligament was divided.  After confirming adequate dissection and the peritoneal reflection completely down and away from the divided round ligament at the deep ring, a Progrip laparoscopic mesh was placed within the anterior abdominal wall.  After noting proper placement of the mesh with the peritoneal reflection deep to it, the previously created peritoneal flap was secured back up to the anterior abdominal wall using running 3-0 V-Loc. Any holes created in the peritoneal flap were closed with 3-0 V-loc.   Attention was then turned to the anterior abdominal wall.  Given the patient's age and activity level, and respiratory comorbidities, decision was made to perform a primary repair of the 2.5 cm incisional hernia.  The hernia defect was closed with 2-0 V-loc in a running fashion.  All needles were then removed out of the abdominal cavity, Xi platform undocked from the ports and removed off of operative field.  The cord lipoma/hernia sac was placed into a 5 mm Endo Catch bag and removed through the midline laparoscopic incision site.  Marcaine was instilled at the incision sites.   The abdomen was then desufflated and ports removed.  The fascia of midline laparoscopic incision site was closed with 0 Vicryl in a running fashion.  All skin incisions were closed with a subcuticular stitch of Monocryl 4-0. Dermabond was applied.   Final inspection revealed acceptable hemostasis. All counts were correct at the end of the case. The patient was awakened from anesthesia and extubated without  complication.  The patient went to the PACU in stable condition.   Theophilus Kinds, DO [facility] [address] [place], [place] [address] [phone] (office)

## 2024-01-04 NOTE — Anesthesia Preprocedure Evaluation (Addendum)
 Anesthesia Evaluation  Patient identified by MRN, date of birth, ID band Patient awake    Reviewed: Allergy & Precautions, NPO status , Patient's Chart, lab work & pertinent test results, reviewed documented beta blocker date and time   History of Anesthesia Complications (+) PONV and history of anesthetic complications  Airway Mallampati: III  TM Distance: <3 FB Neck ROM: Full    Dental  (+) Upper Dentures, Lower Dentures   Pulmonary shortness of breath, with exertion and Long-Term Oxygen Therapy, pneumonia, resolved, COPD History of chronic respiratory failure.  Uses oxygen with any exertion.  Has pulmonary clearance but called high risk for surgery because of her pulmonary status.  Patient declined PFTs   + rhonchi  + decreased breath sounds      Cardiovascular hypertension, Pt. on medications and Pt. on home beta blockers +CHF and + DOE  Normal cardiovascular exam(-) dysrhythmias  Rhythm:Regular  Hyperdynamic systolic function ( EF 70%).  Grade 1 diastolic dysfunction   Neuro/Psych TIA Neuromuscular disease  negative psych ROS   GI/Hepatic Neg liver ROS, hiatal hernia,GERD  Medicated and Controlled,,  Endo/Other    Class 3 obesity  Renal/GU negative Renal ROS     Musculoskeletal  (+) Arthritis , Osteoarthritis,    Abdominal  (+) + obese  Peds  Hematology Polycythemia vera.  Myeloproliferative disorder   Anesthesia Other Findings   Reproductive/Obstetrics                             Anesthesia Physical Anesthesia Plan  ASA: 4  Anesthesia Plan: General   Post-op Pain Management: Dilaudid IV   Induction: Intravenous  PONV Risk Score and Plan: 4 or greater and Ondansetron, Dexamethasone, Midazolam, Propofol infusion and Scopolamine patch - Pre-op  Airway Management Planned: Oral ETT  Additional Equipment: None  Intra-op Plan:   Post-operative Plan: Extubation in OR and Possible  Post-op intubation/ventilation  Informed Consent: I have reviewed the patients History and Physical, chart, labs and discussed the procedure including the risks, benefits and alternatives for the proposed anesthesia with the patient or authorized representative who has indicated his/her understanding and acceptance.     Dental advisory given  Plan Discussed with: CRNA and Surgeon  Anesthesia Plan Comments:         Anesthesia Quick Evaluation

## 2024-01-04 NOTE — Transfer of Care (Signed)
 Immediate Anesthesia Transfer of Care Note  Patient: ICELYN NAVARRETE  Procedure(s) Performed: XI ROBOTIC ASSISTED LEFT INGUINAL HERNIA REPAIR WITH MESH (Left: Abdomen) Repair, hernia, ventral, robot assisted, laparoscopic, D5 (Abdomen) LAPAROSCOPIC LYSIS OF ADHESIONS (Abdomen)  Patient Location: PACU  Anesthesia Type:General  Level of Consciousness: awake and patient cooperative  Airway & Oxygen Therapy: Patient Spontanous Breathing and Patient connected to face mask oxygen  Post-op Assessment: Report given to RN and Post -op Vital signs reviewed and stable  Post vital signs: Reviewed and stable  Last Vitals:  Vitals Value Taken Time  BP 155/63 01/04/24 1330  Temp 97.26F   Pulse 61 01/04/24 1332  Resp 18 01/04/24 1332  SpO2 98 % 01/04/24 1332  Vitals shown include unfiled device data.  Last Pain:  Vitals:   01/04/24 0846  TempSrc: Oral  PainSc:          Complications: No notable events documented.

## 2024-01-04 NOTE — Anesthesia Procedure Notes (Signed)
 Procedure Name: Intubation Date/Time: 01/04/2024 10:38 AM  Performed by: Anda Latina, RNPre-anesthesia Checklist: Patient identified, Emergency Drugs available, Suction available and Patient being monitored Patient Re-evaluated:Patient Re-evaluated prior to induction Oxygen Delivery Method: Circle system utilized Preoxygenation: Pre-oxygenation with 100% oxygen (HOB at 45 degrees) Induction Type: IV induction Ventilation: Mask ventilation without difficulty Laryngoscope Size: Glidescope and 3 Grade View: Grade I Tube type: Oral Tube size: 7.0 mm Number of attempts: 1 Airway Equipment and Method: Stylet and Oral airway Placement Confirmation: ETT inserted through vocal cords under direct vision, positive ETCO2 and breath sounds checked- equal and bilateral Secured at: 23 cm Tube secured with: Tape Dental Injury: Teeth and Oropharynx as per pre-operative assessment  Comments: HOB at 45  degrees for preO2. HOB 0 s/p induction. Elective glidescope r/t resp status and RA. Patient head and neck remained neutral on foam pillow for intubation,

## 2024-01-04 NOTE — Anesthesia Postprocedure Evaluation (Signed)
 Anesthesia Post Note  Patient: [name]  Procedure(s) Performed: XI ROBOTIC ASSISTED LEFT INGUINAL HERNIA REPAIR WITH MESH (Left: Abdomen) Repair, hernia, ventral, robot assisted, laparoscopic, D5 (Abdomen) LAPAROSCOPIC LYSIS OF ADHESIONS (Abdomen)  Patient location during evaluation: PACU Anesthesia Type: General Level of consciousness: awake and alert Pain management: pain level controlled Vital Signs Assessment: post-procedure vital signs reviewed and stable Respiratory status: spontaneous breathing, nonlabored ventilation, respiratory function stable and patient connected to nasal cannula oxygen Cardiovascular status: blood pressure returned to baseline and stable Postop Assessment: no apparent nausea or vomiting Anesthetic complications: no   There were no known notable events for this encounter.   Last Vitals:  Vitals:   01/04/24 1330 01/04/24 1345  BP:    Pulse:    Resp:    Temp: 36.4 C   SpO2:  (!) 89%    Last Pain:  Vitals:   01/04/24 1330  TempSrc:   PainSc: 2                  Damali Broadfoot L Tashon Capp

## 2024-01-04 NOTE — Progress Notes (Signed)
 [facility]  Spoke with the patient's son and daughter in the consultation room.  I explained that she tolerated the procedure without difficulty.  She has dissolvable stitches under the skin with overlying skin glue.  This will flake off in 10 to 14 days.  She has done well since extubation, and I am hopeful that she will be safe to be discharged home today.  I have tentatively discharged her home with a prescription for narcotic pain medication that they should take as needed for pain.  I also want her taking scheduled Tylenol.  If they take the narcotic pain medication, they should take a stool softener as well.  The patient will follow-up with me in 2 weeks.  All questions were answered to their expressed satisfaction.  Theophilus Kinds, DO [facility] [address] [place], [place] [address] [phone] (office)

## 2024-01-04 NOTE — Discharge Instructions (Addendum)
 Ambulatory Surgery Discharge Instructions  General Anesthesia or Sedation Do not drive or operate heavy machinery for 24 hours.  Do not consume alcohol, tranquilizers, sleeping medications, or any non-prescribed medications for 24 hours. Do not make important decisions or sign any important papers in the next 24 hours. You should have someone with you tonight at home.  Activity  You are advised to go directly home from the hospital.  Restrict your activities and rest for a day.  Resume light activity tomorrow. No heavy lifting over 10 lbs or strenuous exercise.  Fluids and Diet Begin with clear liquids, bouillon, dry toast, soda crackers.  If not nauseated, you may go to a regular diet when you desire.  Greasy and spicy foods are not advised.  Medications  If you have not had a bowel movement in 24 hours, take 2 tablespoons over the counter Milk of mag.             You May resume your blood thinners tomorrow (Aspirin, coumadin, or other).  You are being discharged with prescriptions for Opioid/Narcotic Medications: There are some specific considerations for these medications that you should know. Opioid Meds have risks & benefits. Addiction to these meds is always a concern with prolonged use Take medication only as directed Do not drive while taking narcotic pain medication Do not crush tablets or capsules Do not use a different container than medication was dispensed in Lock the container of medication in a cool, dry place out of reach of children and pets. Opioid medication can cause addiction Do not share with anyone else (this is a felony) Do not store medications for future use. Dispose of them properly.     Disposal:  Find a [employer] household drug take back site near you.  If you can't get to a drug take back site, use the recipe below as a last resort to dispose of expired, unused or unwanted drugs. Disposal  (Do not dispose chemotherapy drugs this way, talk to your  prescribing doctor instead.) Step 1: Mix drugs (do not crush) with dirt, kitty litter, or used coffee grounds and add a small amount of water to dissolve any solid medications. Step 2: Seal drugs in plastic bag. Step 3: Place plastic bag in trash. Step 4: Take prescription container and scratch out personal information, then recycle or throw away.  Operative Site  You have a liquid bandage over your incisions, this will begin to flake off in about a week. Ok to English as a second language teacher. Keep wound clean and dry. No baths or swimming. No lifting more than 10 pounds.  Contact Information: If you have questions or concerns, please call our office, [phone], Monday- Thursday 8AM-5PM and Friday 8AM-12Noon.  If it is after hours or on the weekend, please call [facility] Main Number, [phone], and ask to speak to the surgeon on call for Dr. Robyne Peers at [facility].   SPECIFIC COMPLICATIONS TO WATCH FOR: Inability to urinate Fever over 101? F by mouth Nausea and vomiting lasting longer than 24 hours. Pain not relieved by medication ordered Swelling around the operative site Increased redness, warmth, hardness, around operative area Numbness, tingling, or cold fingers or toes Blood -soaked dressing, (small amounts of oozing may be normal) Increasing and progressive drainage from surgical area or exam site    Continue to wear your oxygen @2L  continuously for the next 3 days.   Check your Oxygen SATs every 4 hours through the weekend.   If Sats are less than 90 %  then please contact Dr Sol Passer [phone]) or  Return to the Emergency Room. @ [phone].  Remove your Scopolamine Patch Located behind your ear on Monday.

## 2024-01-04 NOTE — H&P (Signed)
 [facility] History and Physical  Reason for Referral: Spigelian hernia Referring Physician: Ed referral    [name] is a 83 y.o. female.  HPI: [name] is a 83 y.o. female who presents with a 2 to 3-day history of worsening left lower quadrant abdominal pain.  The pain started for her earlier this week, but starting today, the pain started to [place] around her left hip which prompted her to be evaluated by her primary care doctor.  Her primary care doctor recommended that she present to the emergency department.  In the emergency department, she began to get nauseous with multiple episodes of emesis.  She denies ever having pain like this in the past.  She confirms that she is still passing flatus and her last bowel movement was yesterday morning.  She states that it is normal for her to not have daily bowel movements.  Her past medical history is significant for CHF, hypertension, and GERD.  Her surgical history is significant for an abdominal hysterectomy with salpingo-oophorectomy secondary to uterine cancer, and breast lumpectomy and sentinel lymph node biopsy for breast cancer.   In the emergency department, she was noted to be hemodynamically stable.  Her blood work was unremarkable.  She subsequently underwent a CT of the abdomen and pelvis which demonstrated a small bowel obstruction due to a left lower quadrant spigelian hernia without any signs of bowel ischemia.   Upon speaking to the ED physician, he went back and attempted to reduce the hernia, and felt that a mass in the left lower quadrant had been reduced, though he could not palpate a discrete fascial defect.  After reduction of this mass, the patient feels significantly better and confirms passing more flatus.  Patient tolerated a diet while inpatient and was having bowel function.  Discussed adding the patient on the next week for robotic hernia repair after speaking with cardiology and  pulmonology.  Past Medical History:  Diagnosis Date   Arthritis    Chronic diastolic heart failure (HCC)    Essential hypertension    GERD (gastroesophageal reflux disease)    Hiatal hernia    History of endometrial cancer    Sp hysterectomy 2012   Ischemic colitis (HCC) 2016   Macular degeneration of both eyes    wet   Myeloproliferative disorder, JAK-2 positive    Obesity    Pneumonia    Polycythemia vera(238.4)    TIA (transient ischemic attack)     Past Surgical History:  Procedure Laterality Date   ABDOMINAL HYSTERECTOMY  09/12/2011   Procedure: HYSTERECTOMY ABDOMINAL;  Surgeon: Laurette Schimke, MD;  Location: [facility];  Service: Gynecology;  Laterality: N/A;  Total Abdominal Hysterectomy, Bilateral Salpingo Oophorectomy   BREAST LUMPECTOMY Left 07/10/2017   LEFT BREAST LUMPECTOMY WITH RADIOACTIVE SEED AND SENTINEL LYMPH NODE BIOPSY    BREAST LUMPECTOMY WITH RADIOACTIVE SEED AND SENTINEL LYMPH NODE BIOPSY Left 07/10/2017   Procedure: LEFT BREAST LUMPECTOMY WITH RADIOACTIVE SEED AND SENTINEL LYMPH NODE BIOPSY;  Surgeon: Emelia Loron, MD;  Location: [facility] OR;  Service: General;  Laterality: Left;   COLONOSCOPY  07/05/2012   Procedure: COLONOSCOPY;  Surgeon: Malissa Hippo, MD;  Location: [facility];  Service: Endoscopy;  Laterality: N/A;  730   EYE SURGERY     bilateral 8 - 12 yrs ago-cataract   FLEXIBLE SIGMOIDOSCOPY N/A 04/03/2015   Procedure: FLEXIBLE SIGMOIDOSCOPY;  Surgeon: Malissa Hippo, MD;  Location: [facility];  Service: Endoscopy;  Laterality: N/A;  HYSTEROSCOPY WITH D & C  08/15/2011   Procedure: DILATATION AND CURETTAGE (D&C) /HYSTEROSCOPY;  Surgeon: Tilda Burrow, MD;  Location: [facility];  Service: Gynecology;  Laterality: N/A;  With Suction Curette   JOINT REPLACEMENT     left knee replaced 2014   KNEE ARTHROPLASTY Left 03/15/2015   Procedure: COMPUTER ASSISTED TOTAL KNEE ARTHROPLASTY;  Surgeon: Eldred Manges, MD;  Location: [facility] OR;  Service:  Orthopedics;  Laterality: Left;   SALPINGOOPHORECTOMY  09/12/2011   Procedure: SALPINGO OOPHERECTOMY;  Surgeon: Laurette Schimke, MD;  Location: [facility];  Service: Gynecology;  Laterality: Bilateral;    Family History  Problem Relation Age of Onset   Hypertension Mother    Lung cancer Father    Arthritis/Rheumatoid Sister    Anesthesia problems Neg Hx    Hypotension Neg Hx    Malignant hyperthermia Neg Hx    Pseudochol deficiency Neg Hx     Social History   Tobacco Use   Smoking status: Never    Passive exposure: Never   Smokeless tobacco: Never  Vaping Use   Vaping status: Never Used  Substance Use Topics   Alcohol use: No   Drug use: No    Medications: I have reviewed the patient's current medications. Allergies as of 01/04/2024       Reactions   Sulfa Antibiotics    Rash   Tegretol [carbamazepine] Hives, Other (See Comments)   headache       ROS:  Pertinent items are noted in HPI.  Blood pressure (!) 150/69, pulse 62, temperature 97.7 F (36.5 C), temperature source Oral, resp. rate (!) 21, height 5\' 4"  (1.626 m), weight 101.1 kg, SpO2 96%. Physical Exam Vitals reviewed.  Constitutional:      Appearance: Normal appearance.  HENT:     Head: Normocephalic and atraumatic.  Eyes:     Extraocular Movements: Extraocular movements intact.     Pupils: Pupils are equal, round, and reactive to light.  Cardiovascular:     Rate and Rhythm: Normal rate.  Pulmonary:     Effort: Pulmonary effort is normal.  Abdominal:     Comments: Abdomen soft, nondistended, no percussion tenderness, nontender to palpation; no rigidity, guarding, rebound tenderness; left lower quadrant without any palpable masses, no discrete hernia defects able to be palpated   Musculoskeletal:     Cervical back: Normal range of motion.  Skin:    General: Skin is warm and dry.  Neurological:     General: No focal deficit present.     Mental Status: She is alert and oriented to person, place, and  time.  Psychiatric:        Mood and Affect: Mood normal.        Behavior: Behavior normal.     Results: No results found for this or any previous visit (from the past 48 hours).  No results found.   Assessment & Plan:  [name] is a 83 y.o. female who was admitted after reduction of an incarcerated left spigelian hernia that contained a loop of small bowel and was causing a partial small bowel obstruction. CT demonstrated complete reduction of bowel from her spigelian hernia. Imaging and blood work evaluated by myself.   -I discussed the pathophysiology of hernias and we discussed the need for surgical repair -The risk and benefits of robotic assisted laparoscopic left spigelian hernia repair with mesh were discussed including but not limited to bleeding, infection, injury to surrounding structures, need for additional procedures, and hernia  recurrence.  After careful consideration, [name] has decided to proceed with surgery.  -Patient was cleared for surgery by both cardiology and pulmonology -Information provided to the patient regarding hernias -Advised that the patient should present to the ED if they begin to have nausea, vomiting, obstipation, left lower abdominal bulge that is painful and nonreducible.  All questions were answered to the satisfaction of the patient and family.  Note: Portions of this report may have been transcribed using voice recognition software. Every effort has been made to ensure accuracy; however, inadvertent computerized transcription errors may still be present.   Theophilus Kinds, DO [facility] [address] [place], [place] [address] [phone] (office)

## 2024-01-07 ENCOUNTER — Telehealth (INDEPENDENT_AMBULATORY_CARE_PROVIDER_SITE_OTHER): Admitting: Surgery

## 2024-01-07 DIAGNOSIS — Z09 Encounter for follow-up examination after completed treatment for conditions other than malignant neoplasm: Secondary | ICD-10-CM

## 2024-01-07 LAB — SURGICAL PATHOLOGY

## 2024-01-07 NOTE — Telephone Encounter (Signed)
 [facility]  Called to check in on the patient after her recent surgery for a left inguinal hernia.  At the time of discharge, she was requiring 2 L nasal cannula, and she normally only wears oxygen in the evenings and at night.  Since being discharged from the hospital, she has been able to wean herself off of the oxygen during the day, and her saturations have been in the low 90s.  She is having some intermittent sharp pains in her abdomen, but we discussed that this is normal after surgery.  She also stated that she had an issue with swallowing yesterday.  She has never had any issues like this previously.  I discussed that given her age and the need for intubation at the time of surgery, that could have resulted in a little bit of difficulty with swallowing.  We discussed if this continues to persist, she will need to follow-up with her primary care doctor for further evaluation.  She was initially scheduled to follow-up with me on 3/12, but she states that she has a dentist appointment at that time.  We will reschedule her follow-up for me on 3/20.  My office will call her with the new time of her appointment.  All questions were answered to her expressed satisfaction.  Theophilus Kinds, DO [facility] [address]ella Raring [place], [place] [address] [phone] (office)

## 2024-01-07 NOTE — Progress Notes (Unsigned)
 [facility] [address], [place] [address]   CLINIC:  Medical Oncology/Hematology  PCP:  Carylon Perches, MD [address] / [place] [address] [phone]   REASON FOR VISIT:  - History of left-sided breast cancer (September 2018) - JAK2 + polycythemia vera/essential thrombocytosis   PRIOR THERAPY: Left-sided lumpectomy with adjuvant XRT   CURRENT THERAPY: - Letrozole daily since January 2019 - Hydrea [current dose 1000 mg MWF and 1500 mg TTSS]  BRIEF ONCOLOGIC HISTORY:   Oncology History  Malignant neoplasm of upper-outer quadrant of left breast in female, estrogen receptor positive (HCC)  07/10/2017 Surgery   Left lumpectomy: IDC grade 2, 1.3 cm, intermediate grade DCIS, margins negative, 1/2 lymph nodes positive, ER 5% positive weak staining, PR 0%, HER-2 negative ratio 1.73, Ki-67 15%, T1cN0 stage IA AJCC 8   07/10/2017 Miscellaneous   Mammaprint: Low risk, 10-year risk of recurrence untreated 10%   10/01/2017 - 11/14/2017 Radiation Therapy   Adj XRT   11/2017 -  Anti-estrogen oral therapy   Letrozole daily     CANCER STAGING:  Cancer Staging  Malignant neoplasm of upper-outer quadrant of left breast in female, estrogen receptor positive (HCC) Staging form: Breast, AJCC 8th Edition - Clinical stage from 05/22/2017: Stage IA (cT1, cN0, cM0, G1, ER+, PR-, HER2-) - Signed by Hubbard Hartshorn, NP on 06/22/2017 - Pathologic: Stage IIA (pT1c, pN1a, cM0, G2, ER+, PR-, HER2-) - Unsigned   INTERVAL HISTORY:   Ms. GILA LAUF, a 83 y.o. female, returns for routine follow-up of her JAK2 PV/ET and history of left-sided breast cancer. Caitland was last seen by Rojelio Brenner PA-C on 09/19/2023.  In the interim since last visit, she was hospitalized overnight from 12/28/2023 through 12/29/2023 for left lower quadrant abdominal pain due to incarcerated hernia.  Hernia was reduced and she was scheduled for outpatient hernia repair, which was  completed by Dr. Robyne Peers on 01/04/2024.  She remains on Hydrea 1000 mg MWF and 1500 mg TTSS, tolerating this well.  *** *** She does note some mild and intermittent tingling in her fingertips and legs. *** She reports some headaches in her temples, which she thinks may be related to her eye injections. *** She denies any skin sores, mouth sores, or GI side effects. *** She denies any vasomotor symptoms or thrombotic events. *** She has progressive macular degeneration causing blurry vision, but denies any intermittent or transient vision changes. *** No B symptoms. *** She has leg swelling that is equal bilaterally, chronic and stable.  She denies any breast lumps, new-onset bone pain (she has chronic left shoulder pain), B symptoms, or neurologic changes.  *** *** She is tolerating letrozole fairly well with some occasional hot flashes and leg cramps.  She reports 75***% energy and 100***% appetite.  She is maintaining stable weight at this time.  ASSESSMENT & PLAN:  1.  JAK2+ polycythemia vera - JAK2 [id number] mutation detected in 2015 - No prior history of thrombosis - Hospitalized 08/06/2023 through 08/07/2023 for dizziness and concern for acute CVA.  MRI did show tiny punctate right parietal stroke thought to be incidental finding by neurologist. - Hydroxyurea was started in November 2019 - Current dose of Hydrea is 1000 mg daily MWF and 1500 mg TTSS (mild neuropathy on current dose - unable to tolerate 1500 mg daily due to increased fatigue and peripheral neuropathy)*** - No vasomotor symptoms, erythromelalgia, or aquagenic pruritus *** - She is taking aspirin 81 mg daily. - Most  recent labs (during hospitalization on 12/29/2023) show normal platelets 188, Hgb 13.6/hematocrit 39.6/MCV 125.7.  CMP at baseline.   -Therapeutic goal is platelets <400 and HCT <45.0%.*** - PLAN: Blood counts currently at goal.*** - Continue Hydrea 1000 mg MWF and 1500 mg TTSS - Labs and follow-up 3 months  *** - Continue aspirin 81 mg daily - Patient is aware of alarm symptoms that would prompt more immediate medical attention.   2.  T1CN1A (stage IIa) malignant neoplasm of upper-outer quadrant of left breast - Left breast biopsy on 05/29/2017 of the upper outer quadrant mass - IDC, grade 1/2.  ER 5%, PR 0%, Ki-67 15%, HER-2 negative. - Left breast lumpectomy on 07/10/2017, IDC, grade 2, 1.3 cm, margins negative, 1/2 lymph nodes positive, pT1c, PN 1A - MammaPrint-low risk luminal A, average 10-year risk of recurrence untreated 10%. - Adjuvant XRT from 10/01/2017-11/15/2017. - Letrozole 1 mg daily started on 11/15/2017. - BCI testing (October 2023) showed that patient would likely benefit from extended endocrine therapy.  (8.5% risk of late distant recurrence after 5 years of adjuvant endocrine therapy, compared to 2.8% - 3.6% risk of late distant recurrence after 10 total years of adjuvant endocrine therapy) - She is tolerating LETROZOLE with occasional hot flashes and leg cramps, which are improved  - Reviewed mammogram from 05/30/2023, BI-RADS Category 1, negative.  No mammographic evidence of malignancy in either breast. - Annual breast exam (06/13/2023) showed no suspicious mass.  She did have some mild tenderness and palpation of freely mobile cystic structure in left medial breast tissue  - ROS did not reveal any red flag symptoms of recurrence   *** - Most recent labs show baseline CBC and normal CMP - PLAN: Continue letrozole.  (If she continues to have significant leg cramps, would consider switching to alternative aromatase inhibitor.)  Goal of treatment is 10 years (through January 2029) based on BCI results. - Next mammogram due July 2025.   - Breast exam annually and as needed, next due August 2025    3.  Osteopenia with aromatase inhibitor induced bone loss - Bone density scan (05/24/2021): T score -1.0, normal - Bone density scan (05/30/2023): T-score -1.3, osteopenic - Labs (05/23/2023):  Vitamin D level is 62.46, calcium 9.0 - FRAX as calculated by radiologist at time of DEXA scan showed 11.3% probability of major osteoporotic fracture within the next 10 years and 2.6% probability of hip fracture within the next 10 years. Since FRAX does not take into account risk of fracture from AI therapy, some experts adjust for this by counting patient as "RA +" when calculating FRAX.  With this adjustment, patient has 15% risk of major osteoporotic fracture and 3.7% risk of hip fracture within the next 10 years. - Pharmacologic treatment of osteopenia/osteoporosis is generally recommended in the following scenarios: T score </= 2.5, with history of fragility factor. T score between -1.0 and -2.5 who have risks for fracture other than AI therapy. T score between -1.0 and 2.5 to have 10-year probability of hip fracture > 3% or 10-year risk of major osteoporotic fracture > 20% based on FRAX. - Patient would potentially benefit from pharmacological treatment of osteopenia (with low-dose alendronate 35 mg weekly) due to meeting criteria of 10-year probability of hip fracture >3%, when adjusted for aromatase inhibitor use  - Optimal duration of therapy not established, but would consider discontinuing after 5 years if BMD is stable and if her short-term fracture risk remains low.  If she has presence of fragility fracture  or high risk during that timeframe, would consider extending therapy up to 10 years or switching to alternative therapy. - Discussed with patient that bisphosphonate therapy is generally well-tolerated, but potential complications include transient flulike symptoms, renal insufficiency, hypocalcemia, atypical femur fractures, and osteonecrosis of the jaw. - We discussed the importance of maintaining adequate calcium and vitamin D and continuing with weightbearing exercises to improve bone mass. - PLAN: Continue vitamin D 50,000 units every other week. - We discussed her mild osteopenia  and potential benefit from low-dose bisphosphonate.  Since osteopenia is mild, she prefers holding off on pharmacologic treatment at this time. - In the setting of aromatase inhibitor use, we will continue to check bone density scan every 2 to 3 years, next due July 2026.  Will check vitamin D with next labs   4.  Social/family history: - She was recently widowed in February 2022.  She is living independently.  She works part-time for 6 hours a day, office job.  She is non-smoker. - Father had lung cancer.  PLAN SUMMARY: *** TBD *** >> Labs in 3 months = CBC/D, CMP, LDH >> OFFICE visit in 3 months    REVIEW OF SYSTEMS:***  Review of Systems  Constitutional:  Positive for fatigue. Negative for appetite change, chills, diaphoresis, fever and unexpected weight change.  HENT:   Negative for lump/mass and nosebleeds.   Eyes:  Negative for eye problems.  Respiratory:  Positive for shortness of breath (with exertion, baseline). Negative for cough and hemoptysis.   Cardiovascular:  Negative for chest pain, leg swelling and palpitations.  Gastrointestinal:  Negative for abdominal pain, blood in stool, constipation, diarrhea, nausea and vomiting.  Genitourinary:  Negative for hematuria.   Skin: Negative.   Neurological:  Positive for headaches and numbness. Negative for dizziness and light-headedness.  Hematological:  Does not bruise/bleed easily.  Psychiatric/Behavioral:  Positive for sleep disturbance. Negative for depression. The patient is not nervous/anxious.    PHYSICAL EXAM:***  Performance status (ECOG): 3 - Symptomatic, >50% confined to bed  Wt Readings from Last 3 Encounters:  01/04/24 222 lb 14.2 oz (101.1 kg)  01/02/24 223 lb (101.2 kg)  01/01/24 223 lb (101.2 kg)   Physical Exam Constitutional:      Appearance: Normal appearance. She is obese.  Cardiovascular:     Heart sounds: Murmur (Left sternal border, grade 4/6, systolic) heard.  Pulmonary:     Breath sounds: Normal  breath sounds.  Chest:  Breasts:    Right: Normal.  Musculoskeletal:     Right lower leg: Edema present.     Left lower leg: Edema present.  Neurological:     General: No focal deficit present.     Mental Status: Mental status is at baseline.  Psychiatric:        Behavior: Behavior normal. Behavior is cooperative.      PAST MEDICAL/SURGICAL HISTORY:  Past Medical History:  Diagnosis Date   Arthritis    Chronic diastolic heart failure (HCC)    Essential hypertension    GERD (gastroesophageal reflux disease)    Hiatal hernia    History of endometrial cancer    Sp hysterectomy 2012   Ischemic colitis (HCC) 2016   Macular degeneration of both eyes    wet   Myeloproliferative disorder, JAK-2 positive    Obesity    Pneumonia    Polycythemia vera(238.4)    TIA (transient ischemic attack)    Past Surgical History:  Procedure Laterality Date   ABDOMINAL HYSTERECTOMY  09/12/2011  Procedure: HYSTERECTOMY ABDOMINAL;  Surgeon: Laurette Schimke, MD;  Location: [facility];  Service: Gynecology;  Laterality: N/A;  Total Abdominal Hysterectomy, Bilateral Salpingo Oophorectomy   BREAST LUMPECTOMY Left 07/10/2017   LEFT BREAST LUMPECTOMY WITH RADIOACTIVE SEED AND SENTINEL LYMPH NODE BIOPSY    BREAST LUMPECTOMY WITH RADIOACTIVE SEED AND SENTINEL LYMPH NODE BIOPSY Left 07/10/2017   Procedure: LEFT BREAST LUMPECTOMY WITH RADIOACTIVE SEED AND SENTINEL LYMPH NODE BIOPSY;  Surgeon: Emelia Loron, MD;  Location: [facility] OR;  Service: General;  Laterality: Left;   COLONOSCOPY  07/05/2012   Procedure: COLONOSCOPY;  Surgeon: Malissa Hippo, MD;  Location: [facility];  Service: Endoscopy;  Laterality: N/A;  730   EYE SURGERY     bilateral 8 - 12 yrs ago-cataract   FLEXIBLE SIGMOIDOSCOPY N/A 04/03/2015   Procedure: FLEXIBLE SIGMOIDOSCOPY;  Surgeon: Malissa Hippo, MD;  Location: [facility];  Service: Endoscopy;  Laterality: N/A;   HYSTEROSCOPY WITH D & C  08/15/2011   Procedure: DILATATION AND  CURETTAGE (D&C) /HYSTEROSCOPY;  Surgeon: Tilda Burrow, MD;  Location: [facility];  Service: Gynecology;  Laterality: N/A;  With Suction Curette   JOINT REPLACEMENT     left knee replaced 2014   KNEE ARTHROPLASTY Left 03/15/2015   Procedure: COMPUTER ASSISTED TOTAL KNEE ARTHROPLASTY;  Surgeon: Eldred Manges, MD;  Location: [facility] OR;  Service: Orthopedics;  Laterality: Left;   SALPINGOOPHORECTOMY  09/12/2011   Procedure: SALPINGO OOPHERECTOMY;  Surgeon: Laurette Schimke, MD;  Location: [facility];  Service: Gynecology;  Laterality: Bilateral;    SOCIAL HISTORY:  Social History   Socioeconomic History   Marital status: Widowed    Spouse name: Not on file   Number of children: 2   Years of education: 12   Highest education level: High school graduate  Occupational History   Not on file  Tobacco Use   Smoking status: Never    Passive exposure: Never   Smokeless tobacco: Never  Vaping Use   Vaping status: Never Used  Substance and Sexual Activity   Alcohol use: No   Drug use: No   Sexual activity: Not Currently    Partners: Male    Birth control/protection: Surgical    Comment: hysterectomy  Other Topics Concern   Not on file  Social History Narrative   Not on file   Social Drivers of Health   Financial Resource Strain: Low Risk  (09/11/2022)   Overall Financial Resource Strain (CARDIA)    Difficulty of Paying Living Expenses: Not hard at all  Food Insecurity: No Food Insecurity (12/28/2023)   Hunger Vital Sign    Worried About Running Out of Food in the Last Year: Never true    Ran Out of Food in the Last Year: Never true  Transportation Needs: No Transportation Needs (12/28/2023)   PRAPARE - Administrator, Civil Service (Medical): No    Lack of Transportation (Non-Medical): No  Physical Activity: Sufficiently Active (09/11/2022)   Exercise Vital Sign    Days of Exercise per Week: 5 days    Minutes of Exercise per Session: 30 min  Stress: No Stress Concern Present  (09/11/2022)   [employer] of Occupational Health - Occupational Stress Questionnaire    Feeling of Stress : Only a little  Social Connections: Moderately Integrated (12/28/2023)   Social Connection and Isolation Panel [NHANES]    Frequency of Communication with Friends and Family: More than three times a week    Frequency of Social Gatherings with Friends  and Family: More than three times a week    Attends Religious Services: More than 4 times per year    Active Member of Clubs or Organizations: Yes    Attends Banker Meetings: More than 4 times per year    Marital Status: Widowed  Intimate Partner Violence: Not At Risk (12/28/2023)   Humiliation, Afraid, Rape, and Kick questionnaire    Fear of Current or Ex-Partner: No    Emotionally Abused: No    Physically Abused: No    Sexually Abused: No    FAMILY HISTORY:  Family History  Problem Relation Age of Onset   Hypertension Mother    Lung cancer Father    Arthritis/Rheumatoid Sister    Anesthesia problems Neg Hx    Hypotension Neg Hx    Malignant hyperthermia Neg Hx    Pseudochol deficiency Neg Hx     CURRENT MEDICATIONS:  Current Outpatient Medications  Medication Sig Dispense Refill   acetaminophen (TYLENOL) 500 MG tablet Take 2 tablets (1,000 mg total) by mouth every 6 (six) hours for 7 days. 56 tablet 0   aspirin EC 81 MG tablet Take 1 tablet (81 mg total) by mouth at bedtime.     atorvastatin (LIPITOR) 40 MG tablet Take 1 tablet (40 mg total) by mouth daily. (Patient taking differently: Take 40 mg by mouth every evening.) 30 tablet 2   bisacodyl (DULCOLAX) 10 MG suppository Place 1 suppository (10 mg total) rectally daily as needed for moderate constipation or severe constipation. 12 suppository 0   carvedilol (COREG) 12.5 MG tablet Take 0.5 tablets (6.25 mg total) by mouth 2 (two) times daily with a meal. Take 1/2 tablet by 2 times daily     cloNIDine (CATAPRES) 0.3 MG tablet Take 0.15 mg by mouth 2 (two)  times daily.     diltiazem (CARDIZEM CD) 180 MG 24 hr capsule Take 180 mg by mouth daily.      empagliflozin (JARDIANCE) 10 MG TABS tablet Take 1 tablet (10 mg total) by mouth daily before breakfast. 30 tablet 6   esomeprazole (NEXIUM) 40 MG capsule Take 1 capsule (40 mg total) by mouth every morning. 30 capsule 11   famotidine (PEPCID) 20 MG tablet One after supper 30 tablet 11   furosemide (LASIX) 20 MG tablet Take 20 mg by mouth daily. Take with 40 mg to get 60 mg dose daily     furosemide (LASIX) 40 MG tablet Take 1 tablet (40 mg total) by mouth daily.     hydroxyurea (HYDREA) 500 MG capsule Take 2 capsules (1,000 mg total) by mouth every Monday, Wednesday, and Friday AND 3 capsules (1,500 mg total) every Tuesday, Thursday, Saturday, and Sunday. You can split to 1500 mg dose into 2 tablets in the AM and 1 tablet PM.. 80 capsule 3   isosorbide mononitrate (IMDUR) 30 MG 24 hr tablet TAKE 1/2 (ONE-HALF) TABLET BY MOUTH AT BEDTIME 45 tablet 1   letrozole (FEMARA) 2.5 MG tablet Take 1 tablet by mouth once daily 90 tablet 3   losartan (COZAAR) 100 MG tablet Take 100 mg by mouth every evening.      Multiple Vitamins-Minerals (PRESERVISION AREDS PO) Take 1 capsule by mouth 2 (two) times daily at 10 AM and 5 PM.     oxyCODONE (ROXICODONE) 5 MG immediate release tablet Take 1 tablet (5 mg total) by mouth every 6 (six) hours as needed. 8 tablet 0   polyethylene glycol (MIRALAX / GLYCOLAX) 17 g packet Take 17 g  by mouth daily. 14 each 0   senna-docusate (SENOKOT-S) 8.6-50 MG tablet Take 1 tablet by mouth 2 (two) times daily. 60 tablet 0   Simethicone (GAS RELIEF PO) Take 1 tablet by mouth daily.     spironolactone (ALDACTONE) 25 MG tablet Take 12.5 mg by mouth daily.     Vitamin D, Ergocalciferol, (DRISDOL) 1.25 MG (50000 UNIT) CAPS capsule TAKE 1 CAPSULE BY MOUTH EVERY 14 DAYS 6 capsule 0   No current facility-administered medications for this visit.    ALLERGIES:  Allergies  Allergen Reactions    Sulfa Antibiotics     Rash   Tegretol [Carbamazepine] Hives and Other (See Comments)    headache    LABORATORY DATA:  I have reviewed the labs as listed.     Latest Ref Rng & Units 12/29/2023    3:12 AM 12/28/2023   10:49 AM 09/12/2023    8:09 AM  CBC  WBC 4.0 - 10.5 K/uL 7.5  6.8  4.4   Hemoglobin 12.0 - 15.0 g/dL 96.0  45.4  09.8   Hematocrit 36.0 - 46.0 % 39.6  42.6  43.6   Platelets 150 - 400 K/uL 188  203  209       Latest Ref Rng & Units 12/29/2023    3:12 AM 12/28/2023   10:49 AM 09/12/2023    8:09 AM  CMP  Glucose 70 - 99 mg/dL 90  119  85   BUN 8 - 23 mg/dL 12  15  19    Creatinine 0.44 - 1.00 mg/dL 1.47  8.29  5.62   Sodium 135 - 145 mmol/L 138  139  138   Potassium 3.5 - 5.1 mmol/L 3.8  4.0  3.8   Chloride 98 - 111 mmol/L 105  100  100   CO2 22 - 32 mmol/L 26  29  29    Calcium 8.9 - 10.3 mg/dL 8.9  9.7  9.2   Total Protein 6.5 - 8.1 g/dL 6.1  7.2  7.1   Total Bilirubin 0.0 - 1.2 mg/dL 1.2  1.3  0.8   Alkaline Phos 38 - 126 U/L 73  83  80   AST 15 - 41 U/L 15  16  18    ALT 0 - 44 U/L 13  14  12      DIAGNOSTIC IMAGING:  I have independently reviewed the scans and discussed with the patient. CT ABDOMEN PELVIS WO CONTRAST Addendum Date: 12/28/2023 ADDENDUM REPORT: 12/28/2023 18:58 ADDENDUM: Upon further review, the hernia has been reduced from CT scan same day acquired at 12:51 p.m. There is now only fluid within the hernia sac. No bowel within the hernia sac. There is ovoid periphery calcified focus of fat necrosis within the hernia sac . Findings conveyed toCATHERINE PAPPAYLIOU on 12/28/2023  at18:57. Electronically Signed   By: Genevive Bi M.D.   On: 12/28/2023 18:58   Result Date: 12/28/2023 CLINICAL DATA:  Hiatal hernia suspected EXAM: CT ABDOMEN AND PELVIS WITHOUT CONTRAST TECHNIQUE: Multidetector CT imaging of the abdomen and pelvis was performed following the standard protocol without IV contrast. RADIATION DOSE REDUCTION: This exam was performed according to  the departmental dose-optimization program which includes automated exposure control, adjustment of the mA and/or kV according to patient size and/or use of iterative reconstruction technique. COMPARISON:  None Available. FINDINGS: Lower chest: Lung bases are clear. Hepatobiliary: No focal hepatic lesion. Normal gallbladder. No biliary duct dilatation. Common bile duct is normal. Pancreas: Pancreas is normal. No ductal dilatation. No pancreatic  inflammation. Spleen: Normal spleen Adrenals/urinary tract: Adrenal glands and kidneys are normal. Bilateral simple fluid attenuation renal cysts. No follow-up recommended. The ureters and bladder normal. Stomach/Bowel: Moderate size hiatal hernia. Approximately 1/3 third of the stomach above the hemidiaphragms posterior the heart. Sliding-type hiatal hernia. Duodenum normal. No evidence of bowel obstruction. Mild haziness within the small bowel mesentery of the LEFT lower quadrant (image 58/2). There is a lateral abdominal wall hernia along the lateral margin of the LEFT rectus muscle in the LEFT lower quadrant. This is best seen on sagittal series (image [id number] series 5). Loop of small bowel is trapped within this hernia with small amount of fluid in the hernia sac. Vascular/Lymphatic: Abdominal aorta is normal caliber with atherosclerotic calcification. There is no retroperitoneal or periportal lymphadenopathy. No pelvic lymphadenopathy. Reproductive: Post hysterectomy.  Adnexa unremarkable Other: No free fluid. Musculoskeletal: No aggressive osseous lesion. IMPRESSION: Lateral abdominal wall hernia in the LEFT lower quadrant contains a loop of entrapped small bowel with potential obstruction. Recommend surgical consultation. Electronically Signed: By: Genevive Bi M.D. On: 12/28/2023 18:41   CT ABDOMEN PELVIS W CONTRAST Result Date: 12/28/2023 CLINICAL DATA:  Left lower quadrant and flank pain. Tenderness to palpation. EXAM: CT ABDOMEN AND PELVIS WITH CONTRAST  TECHNIQUE: Multidetector CT imaging of the abdomen and pelvis was performed using the standard protocol following bolus administration of intravenous contrast. RADIATION DOSE REDUCTION: This exam was performed according to the departmental dose-optimization program which includes automated exposure control, adjustment of the mA and/or kV according to patient size and/or use of iterative reconstruction technique. CONTRAST:  OMNIPAQUE IOHEXOL 300 MG/ML  SOLN COMPARISON:  None Available. FINDINGS: Lower Chest: No acute findings. Elevation of right hemidiaphragm noted. Hepatobiliary: No suspicious hepatic masses identified. Prior cholecystectomy. No evidence of biliary obstruction. Pancreas:  No mass or inflammatory changes. Spleen: Within normal limits in size and appearance. Adrenals/Urinary Tract: No suspicious masses identified. No evidence of ureteral calculi or hydronephrosis. Stomach/Bowel: Left lower quadrant spigelian hernia is seen containing a dilated small bowel loop and small amount of fluid. Moderate dilatation of proximal small bowel loops is seen, consistent with small-bowel obstruction. No evidence of bowel wall thickening or pneumatosis. A moderate hiatal hernia is seen. Normal appendix visualized. Diverticulosis is seen mainly involving the sigmoid colon, however there is no evidence of diverticulitis. Vascular/Lymphatic: No pathologically enlarged lymph nodes. No acute vascular findings. Reproductive: Prior hysterectomy noted. Adnexal regions are unremarkable in appearance. Other:  None. Musculoskeletal:  No suspicious bone lesions identified. IMPRESSION: Small-bowel obstruction due to left lower quadrant Spigelian hernia. No signs of bowel ischemia identified. Moderate hiatal hernia. Colonic diverticulosis, without radiographic evidence of diverticulitis. Electronically Signed   By: Danae Orleans M.D.   On: 12/28/2023 14:28     WRAP UP:  All questions were answered. The patient knows to  call the clinic with any problems, questions or concerns.  Medical decision making: Moderate***  Time spent on visit: I spent 20 minutes counseling the patient face to face. The total time spent in the appointment was 30 minutes and more than 50% was on counseling.  Carnella Gu[place], PA-C  ***

## 2024-01-08 ENCOUNTER — Encounter (HOSPITAL_COMMUNITY): Payer: Self-pay | Admitting: Surgery

## 2024-01-08 ENCOUNTER — Inpatient Hospital Stay: Payer: Self-pay | Attending: Hematology | Admitting: Physician Assistant

## 2024-01-08 VITALS — BP 134/61 | HR 79 | Temp 97.8°F | Resp 18 | Ht 64.0 in | Wt 224.0 lb

## 2024-01-08 DIAGNOSIS — R519 Headache, unspecified: Secondary | ICD-10-CM | POA: Diagnosis not present

## 2024-01-08 DIAGNOSIS — M8580 Other specified disorders of bone density and structure, unspecified site: Secondary | ICD-10-CM | POA: Diagnosis not present

## 2024-01-08 DIAGNOSIS — R232 Flushing: Secondary | ICD-10-CM | POA: Diagnosis not present

## 2024-01-08 DIAGNOSIS — I110 Hypertensive heart disease with heart failure: Secondary | ICD-10-CM | POA: Insufficient documentation

## 2024-01-08 DIAGNOSIS — Z8249 Family history of ischemic heart disease and other diseases of the circulatory system: Secondary | ICD-10-CM | POA: Insufficient documentation

## 2024-01-08 DIAGNOSIS — N281 Cyst of kidney, acquired: Secondary | ICD-10-CM | POA: Diagnosis not present

## 2024-01-08 DIAGNOSIS — Z170 Estrogen receptor positive status [ER+]: Secondary | ICD-10-CM

## 2024-01-08 DIAGNOSIS — Z1722 Progesterone receptor negative status: Secondary | ICD-10-CM | POA: Insufficient documentation

## 2024-01-08 DIAGNOSIS — G479 Sleep disorder, unspecified: Secondary | ICD-10-CM | POA: Diagnosis not present

## 2024-01-08 DIAGNOSIS — Z882 Allergy status to sulfonamides status: Secondary | ICD-10-CM | POA: Insufficient documentation

## 2024-01-08 DIAGNOSIS — K56699 Other intestinal obstruction unspecified as to partial versus complete obstruction: Secondary | ICD-10-CM | POA: Diagnosis not present

## 2024-01-08 DIAGNOSIS — D75839 Thrombocytosis, unspecified: Secondary | ICD-10-CM | POA: Diagnosis not present

## 2024-01-08 DIAGNOSIS — H538 Other visual disturbances: Secondary | ICD-10-CM | POA: Insufficient documentation

## 2024-01-08 DIAGNOSIS — R609 Edema, unspecified: Secondary | ICD-10-CM | POA: Insufficient documentation

## 2024-01-08 DIAGNOSIS — K5730 Diverticulosis of large intestine without perforation or abscess without bleeding: Secondary | ICD-10-CM | POA: Insufficient documentation

## 2024-01-08 DIAGNOSIS — G629 Polyneuropathy, unspecified: Secondary | ICD-10-CM | POA: Diagnosis not present

## 2024-01-08 DIAGNOSIS — R5383 Other fatigue: Secondary | ICD-10-CM | POA: Diagnosis not present

## 2024-01-08 DIAGNOSIS — Z79899 Other long term (current) drug therapy: Secondary | ICD-10-CM | POA: Insufficient documentation

## 2024-01-08 DIAGNOSIS — M7989 Other specified soft tissue disorders: Secondary | ICD-10-CM | POA: Insufficient documentation

## 2024-01-08 DIAGNOSIS — I5032 Chronic diastolic (congestive) heart failure: Secondary | ICD-10-CM | POA: Diagnosis not present

## 2024-01-08 DIAGNOSIS — Z801 Family history of malignant neoplasm of trachea, bronchus and lung: Secondary | ICD-10-CM | POA: Insufficient documentation

## 2024-01-08 DIAGNOSIS — D473 Essential (hemorrhagic) thrombocythemia: Secondary | ICD-10-CM | POA: Diagnosis not present

## 2024-01-08 DIAGNOSIS — Z1732 Human epidermal growth factor receptor 2 negative status: Secondary | ICD-10-CM | POA: Diagnosis not present

## 2024-01-08 DIAGNOSIS — I700 Atherosclerosis of aorta: Secondary | ICD-10-CM | POA: Diagnosis not present

## 2024-01-08 DIAGNOSIS — Z79811 Long term (current) use of aromatase inhibitors: Secondary | ICD-10-CM | POA: Insufficient documentation

## 2024-01-08 DIAGNOSIS — C50412 Malignant neoplasm of upper-outer quadrant of left female breast: Secondary | ICD-10-CM

## 2024-01-08 DIAGNOSIS — D45 Polycythemia vera: Secondary | ICD-10-CM | POA: Diagnosis not present

## 2024-01-08 DIAGNOSIS — Z8542 Personal history of malignant neoplasm of other parts of uterus: Secondary | ICD-10-CM | POA: Insufficient documentation

## 2024-01-08 DIAGNOSIS — R531 Weakness: Secondary | ICD-10-CM | POA: Insufficient documentation

## 2024-01-08 DIAGNOSIS — R252 Cramp and spasm: Secondary | ICD-10-CM | POA: Diagnosis not present

## 2024-01-08 DIAGNOSIS — Z90710 Acquired absence of both cervix and uterus: Secondary | ICD-10-CM | POA: Insufficient documentation

## 2024-01-08 DIAGNOSIS — Z9049 Acquired absence of other specified parts of digestive tract: Secondary | ICD-10-CM | POA: Insufficient documentation

## 2024-01-08 DIAGNOSIS — Z90721 Acquired absence of ovaries, unilateral: Secondary | ICD-10-CM | POA: Insufficient documentation

## 2024-01-08 DIAGNOSIS — Z8673 Personal history of transient ischemic attack (TIA), and cerebral infarction without residual deficits: Secondary | ICD-10-CM | POA: Insufficient documentation

## 2024-01-08 DIAGNOSIS — Z8261 Family history of arthritis: Secondary | ICD-10-CM | POA: Insufficient documentation

## 2024-01-08 NOTE — Patient Instructions (Addendum)
 [facility] at [facility] Discharge Instructions  You were seen today by Rojelio Brenner PA-C for the following conditions.  ESSENTIAL THROMBOCYTOSIS / POLYCYTHEMIA VERA: Your blood counts look excellent!  We will continue you on the same dose of Hydrea.   Take Hydrea 1000 mg (2 tablets) every morning on Monday, Wednesday, and Friday. Take Hydrea 1000 mg (2 tablets) every morning PLUS 500 mg (1 tablet) every evening on Tuesday, Thursday, Saturday, and Sunday. We will check labs and see you for office visit again in 3 months, and see if you need additional dose adjustment. Continue aspirin 81 mg daily. If you have any new symptoms or side effects before your next visit, please call our office to let us know!  ** You can continue taking your Hydrea as above for the time being.  However, if your surgeon is concerned about any delayed wound healing, please let us know and we would consider temporarily stopping your Hydrea to improve postoperative recovery.  HISTORY OF LEFT BREAST CANCER: - Continue to take letrozole (Femara) as prescribed for your breast cancer. - You did not have any evidence of returning breast cancer on your most recent mammogram or physical exam. - Your next mammogram and breast exam will be due in July 2025  BONE HEALTH: Your bone density scan shows some slight worsening of your bone density, which now classifies as OSTEOPENIA.  Your osteopenia (weakened bones) is mild, but does place you at an increased risk of fracture.  However, you do not need any medical treatment of your osteopenia at this time.  We will check repeat bone density scan in 2 years.  MEDICATIONS: - Continue hydroxyurea (Hydrea) as above - Continue vitamin D once per week.  FOLLOW-UP APPOINTMENT: Office visit in 3 months, after labs   Thank you for choosing [facility] at [facility] to provide your oncology and hematology care.  To afford each patient  quality time with our provider, please arrive at least 15 minutes before your scheduled appointment time.   If you have a lab appointment with the Cancer Center please come in thru the Main Entrance and check in at the main information desk.  You need to re-schedule your appointment should you arrive 10 or more minutes late.  We strive to give you quality time with our providers, and arriving late affects you and other patients whose appointments are after yours.  Also, if you no show three or more times for appointments you may be dismissed from the clinic at the providers discretion.     Again, thank you for choosing [facility].  Our hope is that these requests will decrease the amount of time that you wait before being seen by our physicians.       _____________________________________________________________  Should you have questions after your visit to [facility], please contact our office at [phone] and follow the prompts.  Our office hours are 8:00 a.m. and 4:30 p.m. Monday - Friday.  Please note that voicemails left after 4:00 p.m. may not be returned until the following business day.  We are closed weekends and major holidays.  You do have access to a nurse 24-7, just call the main number to the clinic [phone] and do not press any options, hold on the line and a nurse will answer the phone.    For prescription refill requests, have your pharmacy contact our office and allow 72 hours.  Due to Covid, you will need to wear a mask upon entering the hospital. If you do not have a mask, a mask will be given to you at the Main Entrance upon arrival. For doctor visits, patients may have 1 support person age 58 or older with them. For treatment visits, patients can not have anyone with them due to social distancing guidelines and our immunocompromised population.

## 2024-01-10 DIAGNOSIS — K439 Ventral hernia without obstruction or gangrene: Secondary | ICD-10-CM | POA: Diagnosis not present

## 2024-01-10 DIAGNOSIS — D510 Vitamin B12 deficiency anemia due to intrinsic factor deficiency: Secondary | ICD-10-CM | POA: Diagnosis not present

## 2024-01-14 ENCOUNTER — Encounter (HOSPITAL_COMMUNITY): Payer: Self-pay | Admitting: Surgery

## 2024-01-16 ENCOUNTER — Encounter: Admitting: Surgery

## 2024-01-17 ENCOUNTER — Other Ambulatory Visit: Payer: Self-pay | Admitting: Cardiology

## 2024-01-21 DIAGNOSIS — H0019 Chalazion unspecified eye, unspecified eyelid: Secondary | ICD-10-CM | POA: Diagnosis not present

## 2024-01-21 DIAGNOSIS — H353221 Exudative age-related macular degeneration, left eye, with active choroidal neovascularization: Secondary | ICD-10-CM | POA: Diagnosis not present

## 2024-01-22 ENCOUNTER — Telehealth: Payer: Self-pay

## 2024-01-22 DIAGNOSIS — J9611 Chronic respiratory failure with hypoxia: Secondary | ICD-10-CM | POA: Diagnosis not present

## 2024-01-22 DIAGNOSIS — J449 Chronic obstructive pulmonary disease, unspecified: Secondary | ICD-10-CM | POA: Diagnosis not present

## 2024-01-22 NOTE — Progress Notes (Signed)
   01/22/2024  Patient ID: Jess Barters, female   DOB: 1941-01-12, 83 y.o.   MRN: [id number]   Patient appeared on insurance report for not passing the quality metrics in 2024:  Medication Adherence for Diabetes (MAD)   Outreach to the patient was Unsuccessful.   Meds Tracking:  -Atorvastatin 40 mg - Last filled 90DS on 10/18/23, LDL 61 on 10/02/24, next fill due 01/17/24.  -Jardiance 10 mg - Last filled 30DS on 01/17/24, patient is in metric due to 2 previous fills this year. Taking for HF however still considered for MAD metric. Next fill due 02/16/24.  -Losartan 100 mg - Last filled 90DS on 11/24/23, BP 116/54 on 01/10/24, Next fill due 02/22/24.  Plan:  Unable to reach patient today, not urgent since she is up to date on most fills. May be due for atorvastatin but it's possible she has a few left over. Has been adherent to this in the past and cholesterol is well controlled. Will schedule fill history review and outreach if necessary on 02/21/24.    Fayette Pho, PharmD

## 2024-01-24 ENCOUNTER — Encounter: Payer: Self-pay | Admitting: Surgery

## 2024-01-24 ENCOUNTER — Ambulatory Visit (INDEPENDENT_AMBULATORY_CARE_PROVIDER_SITE_OTHER): Admitting: Surgery

## 2024-01-24 VITALS — BP 133/74 | HR 59 | Temp 97.7°F | Resp 14 | Ht 64.0 in | Wt 224.0 lb

## 2024-01-24 DIAGNOSIS — Z09 Encounter for follow-up examination after completed treatment for conditions other than malignant neoplasm: Secondary | ICD-10-CM

## 2024-01-24 NOTE — Progress Notes (Unsigned)
 [facility] Note   HPI:  83 y.o. Female presents to clinic for post-op follow-up status post robotic assisted laparoscopic left inguinal hernia repair with mesh, robotic assisted laparoscopic primary repair of incisional ventral hernia and lysis of adhesions on 2/28.  Since surgery, the patient has been doing well.  She is tolerating a diet without nausea and vomiting and moving her bowels without issue.  She denies any abdominal pain.  She also denies any bulging like what she was experiencing preoperatively.  Denies issues at her incision sites, fevers, and chills  Review of Systems:  All other review of systems: otherwise negative   Vital Signs:  BP 133/74   Pulse (!) 59   Temp 97.7 F (36.5 C) (Oral)   Resp 14   Ht 5\' 4"  (1.626 m)   Wt 224 lb (101.6 kg)   SpO2 93%   BMI 38.45 kg/m    Physical Exam:  Physical Exam Vitals reviewed.  Constitutional:      Appearance: Normal appearance.  Abdominal:     Comments: Abdomen soft, nondistended, no percussion tenderness, nontender to palpation; no rigidity, guarding, or rebound tenderness; incision sites healing well, mild ecchymosis around supraumbilical incision site  Neurological:     Mental Status: She is alert.     Laboratory studies: None  Imaging:  None  Assessment:  83 y.o. yo Female who presents for follow-up status post robotic assisted laparoscopic left inguinal hernia repair with mesh, primary repair of incisional hernia, and lysis of adhesions on 2/28.  Plan:  -Patient has been doing very well since surgery.  Tolerating a diet, moving her bowels, and pain well-controlled -Advised that she should continue to take it easy for the next 2 weeks in regards to lifting.  Beyond that, she will have no restrictions -Follow up with me as needed  All of the above recommendations were discussed with the patient and patient's family, and all of patient's and family's questions were answered to their expressed  satisfaction.  Note: Portions of this report may have been transcribed using voice recognition software. Every effort has been made to ensure accuracy; however, inadvertent computerized transcription errors may still be present.   Theophilus Kinds, DO [facility] [address] [place], [place] [address] [phone] (office)

## 2024-01-25 ENCOUNTER — Other Ambulatory Visit: Payer: Self-pay

## 2024-01-25 DIAGNOSIS — D473 Essential (hemorrhagic) thrombocythemia: Secondary | ICD-10-CM

## 2024-01-25 MED ORDER — HYDROXYUREA 500 MG PO CAPS: ORAL_CAPSULE | ORAL | 3 refills | Status: DC

## 2024-01-28 DIAGNOSIS — H353221 Exudative age-related macular degeneration, left eye, with active choroidal neovascularization: Secondary | ICD-10-CM | POA: Diagnosis not present

## 2024-01-29 ENCOUNTER — Other Ambulatory Visit: Payer: Self-pay | Admitting: Cardiology

## 2024-02-06 DIAGNOSIS — H353211 Exudative age-related macular degeneration, right eye, with active choroidal neovascularization: Secondary | ICD-10-CM | POA: Diagnosis not present

## 2024-02-08 DIAGNOSIS — R221 Localized swelling, mass and lump, neck: Secondary | ICD-10-CM | POA: Diagnosis not present

## 2024-02-16 ENCOUNTER — Other Ambulatory Visit: Payer: Self-pay | Admitting: Physician Assistant

## 2024-02-16 DIAGNOSIS — Z170 Estrogen receptor positive status [ER+]: Secondary | ICD-10-CM

## 2024-02-21 ENCOUNTER — Telehealth: Payer: Self-pay

## 2024-02-21 NOTE — Progress Notes (Signed)
   02/21/2024  Patient ID: [name], female   DOB: December 11, 1940, 83 y.o.   MRN: [id number]   Patient appeared on insurance report for not passing the quality metrics in 2024:  Medication Adherence for Diabetes (MAD)   Outreach to the patient was not needed today.  Meds Tracking:  -Atorvastatin 40 mg - Last filled 90DS on 01/25/24, LDL 61 on 10/02/24. Does not qualify for metric yet this year, next fill due 04/24/24.  -Jardiance 10 mg - Last filled 30DS on 02/17/24, taking for HF however still considered for MAD metric. Qualifies for metric this year, next fill due 03/18/24.  -Losartan 100 mg - Last filled 90DS on 02/19/24, BP 144/78 on 02/08/24 which is first elevation above goal. Patient qualifies for metric this year, next fill due 05/19/24  Plan:  Patient up to date on fills and chronic conditions well controlled. Will monitor BP with subsequent visits to confirm control. Review fill history on 03/20/24 for Jardiance.   Flint Hummer, PharmD

## 2024-02-22 DIAGNOSIS — J9611 Chronic respiratory failure with hypoxia: Secondary | ICD-10-CM | POA: Diagnosis not present

## 2024-02-22 DIAGNOSIS — J449 Chronic obstructive pulmonary disease, unspecified: Secondary | ICD-10-CM | POA: Diagnosis not present

## 2024-02-25 DIAGNOSIS — H43813 Vitreous degeneration, bilateral: Secondary | ICD-10-CM | POA: Diagnosis not present

## 2024-02-25 DIAGNOSIS — H35363 Drusen (degenerative) of macula, bilateral: Secondary | ICD-10-CM | POA: Diagnosis not present

## 2024-02-25 DIAGNOSIS — D3131 Benign neoplasm of right choroid: Secondary | ICD-10-CM | POA: Diagnosis not present

## 2024-02-25 DIAGNOSIS — H353231 Exudative age-related macular degeneration, bilateral, with active choroidal neovascularization: Secondary | ICD-10-CM | POA: Diagnosis not present

## 2024-03-03 DIAGNOSIS — H353221 Exudative age-related macular degeneration, left eye, with active choroidal neovascularization: Secondary | ICD-10-CM | POA: Diagnosis not present

## 2024-03-10 NOTE — Progress Notes (Unsigned)
 [name], female    DOB: [birth year]    MRN: [id number]   Brief patient profile:  82  yowf  never smoker  referred to pulmonary clinic in [place]  05/23/2022 by Dr Londa Rival  for unexplained hypoxemia dating back to time of  abd hysterectomy in 2012 at wt 271 and supplied with 02 but rarely used it but noted difficulty from [facility] parking into her work around summer 2022      History of Present Illness  05/23/2022  Pulmonary/ 1st office eval/ [name] / Sales executive Complaint  Patient presents with   Consult    Patient was seeing Dr. Zoila Hines has been on O2 for years- prn during day and sometimes at night. She states her breathing has worsened over last year.   Dyspnea:  50 ft and 02 drops Cough: just hoarse  Sleep: 30 degree on side - immediate smothering if lies flat  SABA use: not sure they help  Vax x 2 for covid / never infected  Rec Try using 2lpm at bedtime for now and we will do an overnight study to be sure that's enough  Make sure you check your oxygen  saturation  AT  your highest level of activity (not after you stop)   to be sure it stays over 90%  We will have the [employer] rep call you re supplying you with a portable oxygen  concentrator but it may not be enough to keep your 02 sats above 90% which is the goal here. My office will be contacting you by phone for referral for High resolution CT of chest Continue Nexium  40 mg Take 30-60 min before first meal of the day  GERD diet  We will schedule a return to my office in [place] with PFTs same day.     06/14/2023  f/u ov/[place] office/[name] re: PF maint on 02   Chief Complaint  Patient presents with   DOE  Dyspnea:  using rollator x 15 min circular drive stopping maybe once on 3lpm cont lowest 90%   Cough: min dry assoc pnd Sleeping: 45 degrees no resp cc  SABA use: none  02: 3lpm hs and prn at rest up to 3lpm cont with ex  Rec Pantoprazole  (protonix ) 40 mg   Take  30-60 min before first meal of the  day and add Pepcid  (famotidine )  20 mg after supper until return to office - this is the best way to tell whether stomach acid is contributing to your problem.   GERD diet reviewed, bed blocks rec  For itching /sneezing / cough zyrtec 10 mg in evening as needed - works x 24 h Make sure you check your oxygen  saturation  AT  your highest level of activity (not after you stop)   to be sure it stays over 90%  Please schedule a follow up visit in 3 months but call sooner if needed    09/13/2023  f/u ov/[place] office/[name] re: PF  maint on prn ex 02   Chief Complaint  Patient presents with   Shortness of Breath   Dyspnea:  circular drive on rollator sats 40% on RA Cough: none  Sleeping: electric hob 45 degrees s    resp cc  SABA use: none  02: 3lpm hs  Rec No change in medications Make sure you check your oxygen  saturation  AT  your highest level of activity (not after you stop)   to be sure it stays over 90% and adjust  02  flow upward to maintain this level if needed but remember to turn it back to previous settings when you stop (to conserve your supply).      01/01/2024  f/u ov/[place] office/[name] re: PF since ? 2012   maint on GERD  rx /02 and now needing clearance for hernia repair  Chief Complaint  Patient presents with   Follow-up    Needing clearance for surgery   Dyspnea:  usual walk around the circle x  up to 30 min RA = low 90s usually @ very slow pace  Cough: none  Sleeping: 45 degrees electric bed s resp cc  SABA use: none  02: 3lpm bedtime / none at rest and up to 4lpm walking  Rec Make sure you check your oxygen  saturation  AT  your highest level of activity (not after you stop)   to be sure it stays over 90%   You are cleared for surgery on your hernia    Keep previous appointments   01/04/24 Robotic assisted laparoscopic left inguinal hernia repair with mesh; robotic assisted laparoscopic primary repair of incisional ventral hernia, defect size 2.5 cm; robotic  assisted laparoscopic lysis of adhesions    03/11/2024  f/u ov/[place] office/[name] re: PF since ? 2012 maint on GERD rx   Chief Complaint  Patient presents with   Shortness of Breath  Dyspnea:  has to walk across parking lot at work with sats 89% s recent change using [facility] parking  Cough: none  Sleeping: 45 electric bed s    resp cc on 2.5 lpm  SABA use: none  02: just at hs 2.5 lpm and POC with extended.     Current Meds  Medication Sig   aspirin  EC 81 MG tablet Take 1 tablet (81 mg total) by mouth at bedtime.   atorvastatin  (LIPITOR) 40 MG tablet Take 1 tablet (40 mg total) by mouth daily. (Patient taking differently: Take 40 mg by mouth every evening.)   bisacodyl  (DULCOLAX) 10 MG suppository Place 1 suppository (10 mg total) rectally daily as needed for moderate constipation or severe constipation.   carvedilol  (COREG ) 12.5 MG tablet Take 0.5 tablets (6.25 mg total) by mouth 2 (two) times daily with a meal. Take 1/2 tablet by 2 times daily   cloNIDine  (CATAPRES ) 0.3 MG tablet Take 0.15 mg by mouth 2 (two) times daily.   Cyanocobalamin  (VITAMIN B 12 PO) Take by mouth.   diltiazem  (CARDIZEM  CD) 180 MG 24 hr capsule Take 180 mg by mouth daily.    empagliflozin  (JARDIANCE ) 10 MG TABS tablet Take 1 tablet (10 mg total) by mouth daily before breakfast.   erythromycin ophthalmic ointment Place 1 Application into both eyes.   esomeprazole  (NEXIUM ) 40 MG capsule Take 1 capsule (40 mg total) by mouth every morning.   famotidine  (PEPCID ) 20 MG tablet One after supper   furosemide  (LASIX ) 20 MG tablet Take 20 mg by mouth daily. Take with 40 mg to get 60 mg dose daily   furosemide  (LASIX ) 40 MG tablet Take 1 tablet (40 mg total) by mouth daily.   hydroxyurea  (HYDREA ) 500 MG capsule Take 2 capsules (1,000 mg total) by mouth every Monday, Wednesday, and Friday AND 3 capsules (1,500 mg total) every Tuesday, Thursday, Saturday, and Sunday. You can split to 1500 mg dose into 2 tablets in the AM and 1  tablet PM..   isosorbide  mononitrate (IMDUR ) 30 MG 24 hr tablet TAKE 1/2 (ONE-HALF) TABLET BY MOUTH AT BEDTIME   letrozole  (FEMARA ) 2.5  MG tablet Take 1 tablet by mouth once daily   losartan  (COZAAR ) 100 MG tablet Take 100 mg by mouth every evening.    Multiple Vitamins-Minerals (PRESERVISION AREDS PO) Take 1 capsule by mouth 2 (two) times daily at 10 AM and 5 PM.   senna-docusate (SENOKOT-S) 8.6-50 MG tablet Take 1 tablet by mouth 2 (two) times daily.   Simethicone  (GAS RELIEF PO) Take 1 tablet by mouth daily.   spironolactone  (ALDACTONE ) 25 MG tablet Take 12.5 mg by mouth daily.   Vitamin D , Ergocalciferol , (DRISDOL ) 1.25 MG (50000 UNIT) CAPS capsule TAKE 1 CAPSULE BY MOUTH EVERY TWO WEEKS                Past Medical History:  Diagnosis Date   Arthritis    Chronic diastolic heart failure (HCC)    Essential hypertension    GERD (gastroesophageal reflux disease)    Gout    Hiatal hernia    History of endometrial cancer    Sp hysterectomy 2012   Ischemic colitis (HCC) 2016   Myeloproliferative disorder, JAK-2 positive    Obesity    Pneumonia    Polycythemia vera(238.4)    TIA (transient ischemic attack)        Objective:    Wts  03/11/2024          227 01/01/2024        223  09/13/2023        230   06/14/2023         230   06/26/22 240 lb 3.2 oz (109 kg)  05/23/22 244 lb (110.7 kg)  05/18/22 238 lb 6.4 oz (108.1 kg)    Vital signs reviewed  03/11/2024  - Note at rest 02 sats  90% on RA    General appearance:    amb wf using wheelchair/ nad    HEENT : Oropharynx  clear/ top dentures      Nasal turbinates nl    NECK :  without  apparent JVD/ palpable Nodes/TM    LUNGS: no acc muscle use,  Nl contour chest which is clear to A and P bilaterally without cough on insp or exp maneuvers   CV:  RRR  no s3 3/6 SEM s increase in P2, and 2+ pitting R and 3+ on L   ABD:  soft and nontender   MS:   ext warm without deformities Or obvious joint restrictions  calf tenderness,  cyanosis or clubbing    SKIN: warm and dry without lesions    NEURO:  alert, approp, nl sensorium with  no motor or cerebellar deficits apparent.         Assessment

## 2024-03-11 ENCOUNTER — Ambulatory Visit: Payer: HMO | Admitting: Internal Medicine

## 2024-03-11 ENCOUNTER — Encounter: Payer: Self-pay | Admitting: Internal Medicine

## 2024-03-11 VITALS — BP 137/79 | HR 63 | Ht 64.0 in | Wt 227.2 lb

## 2024-03-11 DIAGNOSIS — J8489 Other specified interstitial pulmonary diseases: Secondary | ICD-10-CM

## 2024-03-11 DIAGNOSIS — R0902 Hypoxemia: Secondary | ICD-10-CM | POA: Diagnosis not present

## 2024-03-11 DIAGNOSIS — J9611 Chronic respiratory failure with hypoxia: Secondary | ICD-10-CM

## 2024-03-11 DIAGNOSIS — R0609 Other forms of dyspnea: Secondary | ICD-10-CM

## 2024-03-11 NOTE — Patient Instructions (Signed)
Make sure you check your oxygen saturation  AT  your highest level of activity (not after you stop)   to be sure it stays over 90% and adjust  02 flow upward to maintain this level if needed but remember to turn it back to previous settings when you stop (to conserve your supply).  ° ° °Please schedule a follow up visit in 12 months but call sooner if needed  °

## 2024-03-11 NOTE — Assessment & Plan Note (Signed)
 Onset 2012 p abd hysterectomy at wt 272  - Echo  09/13/21 1. Left ventricular ejection fraction, by estimation, is 65 to 70%. The  left ventricle has normal function.  There is moderate left ventricular hypertrophy.   2. Right ventricular systolic function is normal. The right ventricular  size is normal. There is severely elevated pulmonary artery systolic  pressure.   3. Left atrial size was severely dilated.   4. The mitral valve is abnormal. No evidence of mitral valve  regurgitation. Mild to moderate mitral stenosis. Moderate mitral annular  calcification.   5. The tricuspid valve is abnormal.   6. The aortic valve is tricuspid. There is moderate calcification of the  aortic valve. There is moderate thickening of the aortic valve. Aortic  valve regurgitation is not visualized. Mild aortic valve stenosis. Aortic  valve mean gradient measures 10.0  mmHg. Aortic valve peak gradient measures 22.7 mmHg. Aortic valve area, by  VTI measures 2.11 cm   7. The inferior vena cava is dilated in size with >50% respiratory  variability, suggesting right atrial pressure of 8 mmHg.  8.Right Atrium: Right atrial size was normal in size -  PFT's  03/23/11   FEV1 1.31 (61 % ) ratio 0.71  p 0 % improvement from saba p ? prior to study with DLCO  19.4 (73% %)  and FV curve min concave and ERV  370 cc at wt 270   - PFT's  06/26/2022   FEV1 1.11(62 % ) ratio 0.88  p 0 % improvement from saba p nothing prior to study with DLCO  13.73 (76%)  and FV curve nl  and ERV 150 (44%)  at wt 240 - Echo 08/07/23  no significant change x for PAS down to 46 from 08/2022   No change ex tol s/p IH surgery/ no change in rx needed

## 2024-03-11 NOTE — Assessment & Plan Note (Signed)
 Started on 02 post op in 2012  - 05/23/2022   Walked on 3lpm  x  3  lap(s) =  approx 450  ft  @ slow/ rollator  pace, stopped due to end of study with lowest 02 sats 90%   - ONO on 2lpm 05/31/22  desat < 89% x 6 h so rec 3lpm and repeat on 3lpm 06/06/2022 >>> pt refused  So ok to leave on 2lpm for now - 06/17/22  ono on 3lpm  Only 7 min desats > no change rx - 06/14/2023   Walked on 3lpm cont  x  2  lap(s) =  approx 300  ft  @ mod fast for rollator pace, stopped due to sob with lowest 02 sats 90%   - 01/01/2024   Walked on RA  x  3  lap(s) =  approx 450  ft  @ slow / rollator pace, stopped due to end of study  with lowest 02 sats 88% transiently on 2nd lap so rec 02 with higher levels of exertion with target always > 90%    Reminded again, esp because of her heart: Make sure you check your oxygen  saturation  AT  your highest level of activity (not after you stop)   to be sure it stays over 90% and adjust  02 flow upward to maintain this level if needed but remember to turn it back to previous settings when you stop (to conserve your supply).

## 2024-03-11 NOTE — Assessment & Plan Note (Addendum)
 See HRCT  06/19/22   - prob NSIP /  also large HH and  PH   No change rx:  >>> 02 plus gerd rx Use of PPI is associated with improved survival time and with decreased radiologic fibrosis per King's study published in AJRCCM vol 184 p1390.  Dec 2011 and also may have other beneficial effects as per the latest review in [facility] vol 193 p1345 Jun 20016.  F/u q 12 m, sooner prn   Each maintenance medication was reviewed in detail including emphasizing most importantly the difference between maintenance and prns and under what circumstances the prns are to be triggered using an action plan format where appropriate.  Total time for H and P, chart review, counseling, reviewing 02/pulse/ox device(s) and generating customized AVS unique to this office visit / same day charting = 24 min

## 2024-03-20 ENCOUNTER — Telehealth: Payer: Self-pay

## 2024-03-20 NOTE — Progress Notes (Signed)
   03/20/2024  Patient ID: [name], female   DOB: 1940-12-20, 83 y.o.   MRN: [id number]  Adherence Monitoring:  Patient up to date on medications, documentation in [facility]. Next review in June.   Flint Hummer, PharmD

## 2024-03-23 DIAGNOSIS — J9611 Chronic respiratory failure with hypoxia: Secondary | ICD-10-CM | POA: Diagnosis not present

## 2024-03-23 DIAGNOSIS — J449 Chronic obstructive pulmonary disease, unspecified: Secondary | ICD-10-CM | POA: Diagnosis not present

## 2024-04-02 DIAGNOSIS — H353211 Exudative age-related macular degeneration, right eye, with active choroidal neovascularization: Secondary | ICD-10-CM | POA: Diagnosis not present

## 2024-04-07 DIAGNOSIS — H353221 Exudative age-related macular degeneration, left eye, with active choroidal neovascularization: Secondary | ICD-10-CM | POA: Diagnosis not present

## 2024-04-08 DIAGNOSIS — D510 Vitamin B12 deficiency anemia due to intrinsic factor deficiency: Secondary | ICD-10-CM | POA: Diagnosis not present

## 2024-04-08 DIAGNOSIS — Z79899 Other long term (current) drug therapy: Secondary | ICD-10-CM | POA: Diagnosis not present

## 2024-04-15 DIAGNOSIS — Z8673 Personal history of transient ischemic attack (TIA), and cerebral infarction without residual deficits: Secondary | ICD-10-CM | POA: Diagnosis not present

## 2024-04-15 DIAGNOSIS — I5032 Chronic diastolic (congestive) heart failure: Secondary | ICD-10-CM | POA: Diagnosis not present

## 2024-04-15 DIAGNOSIS — D519 Vitamin B12 deficiency anemia, unspecified: Secondary | ICD-10-CM | POA: Diagnosis not present

## 2024-04-21 NOTE — Progress Notes (Signed)
 [facility] [address], [place] [address]   CLINIC:  Medical Oncology/Hematology  PCP:  Sheryle Carwin, MD [address] / [place] [address] [phone]   REASON FOR VISIT:  - History of left-sided breast cancer (September 2018) - JAK2 + polycythemia vera/essential thrombocytosis   PRIOR THERAPY: Left-sided lumpectomy with adjuvant XRT   CURRENT THERAPY: - Letrozole  daily since January 2019 - Hydrea  [current dose 1000 mg MWF and 1500 mg TTSS]  BRIEF ONCOLOGIC HISTORY:   Oncology History  Malignant neoplasm of upper-outer quadrant of left breast in female, estrogen receptor positive (HCC)  07/10/2017 Surgery   Left lumpectomy: IDC grade 2, 1.3 cm, intermediate grade DCIS, margins negative, 1/2 lymph nodes positive, ER 5% positive weak staining, PR 0%, HER-2 negative ratio 1.73, Ki-67 15%, T1cN0 stage IA AJCC 8   07/10/2017 Miscellaneous   Mammaprint: Low risk, 10-year risk of recurrence untreated 10%   10/01/2017 - 11/14/2017 Radiation Therapy   Adj XRT   11/2017 -  Anti-estrogen oral therapy   Letrozole  daily     CANCER STAGING:  Cancer Staging  Malignant neoplasm of upper-outer quadrant of left breast in female, estrogen receptor positive (HCC) Staging form: Breast, AJCC 8th Edition - Clinical stage from 05/22/2017: Stage IA (cT1, cN0, cM0, G1, ER+, PR-, HER2-) - Signed by Letha Truman ORN, NP on 06/22/2017 - Pathologic: Stage IIA (pT1c, pN1a, cM0, G2, ER+, PR-, HER2-) - Unsigned   INTERVAL HISTORY:   [name], a 83 y.o. female, returns for routine follow-up of her JAK2 PV/ET and history of left-sided breast cancer. [name] was last seen by Pleasant Barefoot PA-C on 01/08/2024.  She denies any interval changes in health since last visit.   At today's visit, she reports feeling fairly well, apart form some fatigue that she attributes to the heat and humidity. She reports 50% energy and 100% appetite.  She is maintaining  stable weight at this time.  JAK2 POLYCYTHEMIA VERA: She remains on Hydrea  1000 mg MWF and 1500 mg TTSS, tolerating this well.  She denies any skin sores, mouth sores, or GI side effects. She has rare paresthesias in her fingertips and legs, in addition to rare headaches. She denies any vasomotor symptoms (erythromelalgia, Raynaud's, or aquagenic pruritus) or thrombotic events.   She has progressive macular degeneration causing blurry vision. No B symptoms.   She has leg swelling that is equal bilaterally, chronic and stable.  BREAST CANCER: (Addressed in detail at annual breast survivorship visit in August 2025) She denies any breast lumps or lymphadenopathy. She is tolerating letrozole  fairly well with some occasional hot flashes and leg cramps.  ASSESSMENT & PLAN:  1.  JAK2+ polycythemia vera - JAK2 [id number] mutation detected in 2015 - No prior history of thrombosis - Hospitalized 08/06/2023 through 08/07/2023 for dizziness and concern for acute CVA.  MRI did show tiny punctate right parietal stroke thought to be incidental finding by neurologist. - Hydroxyurea  was started in November 2019 - Current dose of Hydrea  is 1000 mg daily MWF and 1500 mg TTSS (mild neuropathy on current dose - unable to tolerate 1500 mg daily due to increased fatigue and peripheral neuropathy) - No vasomotor symptoms, erythromelalgia, or aquagenic pruritus  - She is taking aspirin  81 mg daily. - Labs today (04/22/2024): Platelets 164, hematocrit 37.9/hemoglobin 12.7, WBC 5.0/normal differential.  CMP and LDH normal. -Therapeutic goal is platelets <400 and HCT <45.0%. - PLAN: lood counts currently at goal. - Continue Hydrea  1000 mg  MWF and 1500 mg TTSS.  - Labs and follow-up 3 months  - Continue aspirin  81 mg daily - Patient is aware of alarm symptoms that would prompt more immediate medical attention.   2.  T1CN1A (stage IIa) malignant neoplasm of upper-outer quadrant of left breast - Left breast biopsy on  05/29/2017 of the upper outer quadrant mass - IDC, grade 1/2.  ER 5%, PR 0%, Ki-67 15%, HER-2 negative. - Left breast lumpectomy on 07/10/2017, IDC, grade 2, 1.3 cm, margins negative, 1/2 lymph nodes positive, pT1c, PN 1A - MammaPrint-low risk luminal A, average 10-year risk of recurrence untreated 10%. - Adjuvant XRT from 10/01/2017-11/15/2017. - Letrozole  1 mg daily started on 11/15/2017. - BCI testing (October 2023) showed that patient would likely benefit from extended endocrine therapy.  (8.5% risk of late distant recurrence after 5 years of adjuvant endocrine therapy, compared to 2.8% - 3.6% risk of late distant recurrence after 10 total years of adjuvant endocrine therapy) - She is tolerating LETROZOLE  with occasional hot flashes and leg cramps, which are improved  - Reviewed mammogram from 05/30/2023, BI-RADS Category 1, negative.  No mammographic evidence of malignancy in either breast. - Annual breast exam (06/13/2023) showed no suspicious mass.  She did have some mild tenderness and palpation of freely mobile cystic structure in left medial breast tissue  - ROS did not reveal any red flag symptoms of recurrence    - Most recent labs show baseline CBC and normal CMP - PLAN: Continue letrozole .  (If she continues to have significant leg cramps, would consider switching to alternative aromatase inhibitor.)  Goal of treatment is 10 years (through January 2029) based on BCI results. - Next mammogram due July 2025.   - Breast exam annually and as needed, next due around August 2025    3.  Osteopenia with aromatase inhibitor induced bone loss - Bone density scan (05/24/2021): T score -1.0, normal - Bone density scan (05/30/2023): T-score -1.3, osteopenic - Labs (05/23/2023): Vitamin D  level is 62.46, calcium  9.0 - FRAX as calculated by radiologist at time of DEXA scan showed 11.3% probability of major osteoporotic fracture within the next 10 years and 2.6% probability of hip fracture within the next 10  years. Since FRAX does not take into account risk of fracture from AI therapy, some experts adjust for this by counting patient as RA + when calculating FRAX.  With this adjustment, patient has 15% risk of major osteoporotic fracture and 3.7% risk of hip fracture within the next 10 years. - Pharmacologic treatment of osteopenia/osteoporosis is generally recommended in the following scenarios: T score </= 2.5, with history of fragility factor. T score between -1.0 and -2.5 who have risks for fracture other than AI therapy. T score between -1.0 and 2.5 to have 10-year probability of hip fracture > 3% or 10-year risk of major osteoporotic fracture > 20% based on FRAX. - Patient would potentially benefit from pharmacological treatment of osteopenia (with low-dose alendronate 35 mg weekly) due to meeting criteria of 10-year probability of hip fracture >3%, when adjusted for aromatase inhibitor use  - Optimal duration of therapy not established, but would consider discontinuing after 5 years if BMD is stable and if her short-term fracture risk remains low.  If she has presence of fragility fracture or high risk during that timeframe, would consider extending therapy up to 10 years or switching to alternative therapy. - Discussed with patient that bisphosphonate therapy is generally well-tolerated, but potential complications include transient flulike symptoms, renal insufficiency,  hypocalcemia, atypical femur fractures, and osteonecrosis of the jaw. - We discussed the importance of maintaining adequate calcium  and vitamin D  and continuing with weightbearing exercises to improve bone mass. - PLAN: Continue vitamin D  50,000 units every other week. - We discussed her mild osteopenia and potential benefit from low-dose bisphosphonate.  Since osteopenia is mild, she prefers holding off on pharmacologic treatment at this time. - In the setting of aromatase inhibitor use, we will continue to check bone density scan  every 2 to 3 years, next due July 2026.  Will check vitamin D  with next labs   4.  Social/family history: - She was recently widowed in February 2022.  She is living independently.  She works part-time for 6 hours a day, office job.  She is non-smoker. - Father had lung cancer.  PLAN SUMMARY:  >> Annual screening mammogram due 05/30/2024  >> Labs in 3 months = CBC/D, CMP, LDH, vitamin D  >> OFFICE visit in 3 months (1 week after labs)    REVIEW OF SYSTEMS:  Review of Systems  Constitutional:  Positive for fatigue. Negative for appetite change, chills, diaphoresis, fever and unexpected weight change.  HENT:   Negative for lump/mass and nosebleeds.   Eyes:  Negative for eye problems.  Respiratory:  Positive for cough and shortness of breath (with exertion, baseline). Negative for hemoptysis.   Cardiovascular:  Negative for chest pain, leg swelling and palpitations.  Gastrointestinal:  Negative for abdominal pain, blood in stool, constipation, diarrhea, nausea and vomiting.  Genitourinary:  Negative for hematuria.   Skin: Negative.   Neurological:  Negative for dizziness, headaches, light-headedness and numbness.  Hematological:  Does not bruise/bleed easily.  Psychiatric/Behavioral:  Positive for sleep disturbance (difficulty staying asleep). Negative for depression. The patient is not nervous/anxious.    PHYSICAL EXAM:  Performance status (ECOG): 3 - Symptomatic, >50% confined to bed  Wt Readings from Last 3 Encounters:  04/22/24 229 lb 4.5 oz (104 kg)  03/11/24 227 lb 3.2 oz (103.1 kg)  01/24/24 224 lb (101.6 kg)   Vitals:   04/22/24 1302 04/22/24 1307  BP: (!) 150/63 128/69  Pulse: 69   Resp: 20   Temp: 98.1 F (36.7 C)   TempSrc: Oral   SpO2: 95%   Weight: 229 lb 4.5 oz (104 kg)      Physical Exam Constitutional:      Appearance: Normal appearance. She is obese.   Cardiovascular:     Heart sounds: Murmur (Left sternal border, grade 4/6, rumbling systolic murmur)  heard.  Pulmonary:     Breath sounds: Normal breath sounds.  Chest:  Breasts:    Right: Normal.   Musculoskeletal:     Right lower leg: Edema present.     Left lower leg: Edema present.   Neurological:     General: No focal deficit present.     Mental Status: Mental status is at baseline.   Psychiatric:        Behavior: Behavior normal. Behavior is cooperative.      PAST MEDICAL/SURGICAL HISTORY:  Past Medical History:  Diagnosis Date   Arthritis    Chronic diastolic heart failure (HCC)    Essential hypertension    GERD (gastroesophageal reflux disease)    Hiatal hernia    History of endometrial cancer    Sp hysterectomy 2012   Ischemic colitis (HCC) 2016   Macular degeneration of both eyes    wet   Myeloproliferative disorder, JAK-2 positive    Obesity  Pneumonia    Polycythemia vera(238.4)    TIA (transient ischemic attack)    Past Surgical History:  Procedure Laterality Date   ABDOMINAL HYSTERECTOMY  09/12/2011   Procedure: HYSTERECTOMY ABDOMINAL;  Surgeon: Sari Bachelor, MD;  Location: [facility];  Service: Gynecology;  Laterality: N/A;  Total Abdominal Hysterectomy, Bilateral Salpingo Oophorectomy   BREAST LUMPECTOMY Left 07/10/2017   LEFT BREAST LUMPECTOMY WITH RADIOACTIVE SEED AND SENTINEL LYMPH NODE BIOPSY    BREAST LUMPECTOMY WITH RADIOACTIVE SEED AND SENTINEL LYMPH NODE BIOPSY Left 07/10/2017   Procedure: LEFT BREAST LUMPECTOMY WITH RADIOACTIVE SEED AND SENTINEL LYMPH NODE BIOPSY;  Surgeon: Ebbie Cough, MD;  Location: [facility] OR;  Service: General;  Laterality: Left;   COLONOSCOPY  07/05/2012   Procedure: COLONOSCOPY;  Surgeon: Claudis RAYMOND Rivet, MD;  Location: [facility];  Service: Endoscopy;  Laterality: N/A;  730   EYE SURGERY     bilateral 8 - 12 yrs ago-cataract   FLEXIBLE SIGMOIDOSCOPY N/A 04/03/2015   Procedure: FLEXIBLE SIGMOIDOSCOPY;  Surgeon: Claudis RAYMOND Rivet, MD;  Location: [facility];  Service: Endoscopy;  Laterality: N/A;   HYSTEROSCOPY  WITH D & C  08/15/2011   Procedure: DILATATION AND CURETTAGE (D&C) /HYSTEROSCOPY;  Surgeon: Norleen LULLA Server, MD;  Location: [facility];  Service: Gynecology;  Laterality: N/A;  With Suction Curette   JOINT REPLACEMENT     left knee replaced 2014   KNEE ARTHROPLASTY Left 03/15/2015   Procedure: COMPUTER ASSISTED TOTAL KNEE ARTHROPLASTY;  Surgeon: Oneil JAYSON Herald, MD;  Location: [facility] OR;  Service: Orthopedics;  Laterality: Left;   LAPAROSCOPIC LYSIS OF ADHESIONS  01/04/2024   Procedure: LAPAROSCOPIC LYSIS OF ADHESIONS;  Surgeon: Evonnie Dorothyann LABOR, DO;  Location: [facility];  Service: General;;   REPAIR, HERNIA, VENTRAL, ROBOT ASSISTED, LAPAROSCOPIC N/A 01/04/2024   Procedure: Repair, hernia, ventral, robot assisted, laparoscopic, D5;  Surgeon: Evonnie Dorothyann LABOR, DO;  Location: [facility];  Service: General;  Laterality: N/A;   SALPINGOOPHORECTOMY  09/12/2011   Procedure: SALPINGO OOPHERECTOMY;  Surgeon: Sari Bachelor, MD;  Location: [facility];  Service: Gynecology;  Laterality: Bilateral;   XI ROBOTIC ASSISTED VENTRAL HERNIA Left 01/04/2024   Procedure: XI ROBOTIC ASSISTED LEFT INGUINAL HERNIA REPAIR WITH MESH;  Surgeon: Evonnie Dorothyann LABOR, DO;  Location: [facility];  Service: General;  Laterality: Left;    SOCIAL HISTORY:  Social History   Socioeconomic History   Marital status: Widowed    Spouse name: Not on file   Number of children: 2   Years of education: 12   Highest education level: High school graduate  Occupational History   Not on file  Tobacco Use   Smoking status: Never    Passive exposure: Never   Smokeless tobacco: Never  Vaping Use   Vaping status: Never Used  Substance and Sexual Activity   Alcohol use: No   Drug use: No   Sexual activity: Not Currently    Partners: Male    Birth control/protection: Surgical    Comment: hysterectomy  Other Topics Concern   Not on file  Social History Narrative   Not on file   Social Drivers of Health   Financial Resource Strain:  Low Risk  (09/11/2022)   Overall Financial Resource Strain (CARDIA)    Difficulty of Paying Living Expenses: Not hard at all  Food Insecurity: No Food Insecurity (12/28/2023)   Hunger Vital Sign    Worried About Running Out of Food in the Last Year: Never true    Ran Out of Food  in the Last Year: Never true  Transportation Needs: No Transportation Needs (12/28/2023)   PRAPARE - Administrator, Civil Service (Medical): No    Lack of Transportation (Non-Medical): No  Physical Activity: Sufficiently Active (09/11/2022)   Exercise Vital Sign    Days of Exercise per Week: 5 days    Minutes of Exercise per Session: 30 min  Stress: No Stress Concern Present (09/11/2022)   [employer] of Occupational Health - Occupational Stress Questionnaire    Feeling of Stress : Only a little  Social Connections: Moderately Integrated (12/28/2023)   Social Connection and Isolation Panel    Frequency of Communication with Friends and Family: More than three times a week    Frequency of Social Gatherings with Friends and Family: More than three times a week    Attends Religious Services: More than 4 times per year    Active Member of [employer] or Organizations: Yes    Attends Banker Meetings: More than 4 times per year    Marital Status: Widowed  Intimate Partner Violence: Not At Risk (12/28/2023)   Humiliation, Afraid, Rape, and Kick questionnaire    Fear of Current or Ex-Partner: No    Emotionally Abused: No    Physically Abused: No    Sexually Abused: No    FAMILY HISTORY:  Family History  Problem Relation Age of Onset   Hypertension Mother    Lung cancer Father    Arthritis/Rheumatoid Sister    Anesthesia problems Neg Hx    Hypotension Neg Hx    Malignant hyperthermia Neg Hx    Pseudochol deficiency Neg Hx     CURRENT MEDICATIONS:  Current Outpatient Medications  Medication Sig Dispense Refill   aspirin  EC 81 MG tablet Take 1 tablet (81 mg total) by mouth at bedtime.      atorvastatin  (LIPITOR) 40 MG tablet Take 1 tablet (40 mg total) by mouth daily. (Patient taking differently: Take 40 mg by mouth every evening.) 30 tablet 2   bisacodyl  (DULCOLAX) 10 MG suppository Place 1 suppository (10 mg total) rectally daily as needed for moderate constipation or severe constipation. 12 suppository 0   carvedilol  (COREG ) 12.5 MG tablet Take 0.5 tablets (6.25 mg total) by mouth 2 (two) times daily with a meal. Take 1/2 tablet by 2 times daily     cloNIDine  (CATAPRES ) 0.3 MG tablet Take 0.15 mg by mouth 2 (two) times daily.     Cyanocobalamin  (VITAMIN B 12 PO) Take by mouth.     diltiazem  (CARDIZEM  CD) 180 MG 24 hr capsule Take 180 mg by mouth daily.      empagliflozin  (JARDIANCE ) 10 MG TABS tablet Take 1 tablet (10 mg total) by mouth daily before breakfast. 30 tablet 6   esomeprazole  (NEXIUM ) 40 MG capsule Take 1 capsule (40 mg total) by mouth every morning. 30 capsule 11   famotidine  (PEPCID ) 20 MG tablet One after supper 30 tablet 11   furosemide  (LASIX ) 20 MG tablet Take 20 mg by mouth daily. Take with 40 mg to get 60 mg dose daily     furosemide  (LASIX ) 40 MG tablet Take 1 tablet (40 mg total) by mouth daily.     isosorbide  mononitrate (IMDUR ) 30 MG 24 hr tablet TAKE 1/2 (ONE-HALF) TABLET BY MOUTH AT BEDTIME 45 tablet 3   letrozole  (FEMARA ) 2.5 MG tablet Take 1 tablet by mouth once daily 90 tablet 3   losartan  (COZAAR ) 100 MG tablet Take 100 mg by mouth every evening.  Multiple Vitamins-Minerals (PRESERVISION AREDS PO) Take 1 capsule by mouth 2 (two) times daily at 10 AM and 5 PM.     polyethylene glycol (MIRALAX  / GLYCOLAX ) 17 g packet Take 17 g by mouth daily. 14 each 0   senna-docusate (SENOKOT-S) 8.6-50 MG tablet Take 1 tablet by mouth 2 (two) times daily. 60 tablet 0   Simethicone  (GAS RELIEF PO) Take 1 tablet by mouth daily.     spironolactone  (ALDACTONE ) 25 MG tablet Take 12.5 mg by mouth daily.     hydroxyurea  (HYDREA ) 500 MG capsule Take 2 capsules (1,000  mg total) by mouth every Monday, Wednesday, and Friday AND 3 capsules (1,500 mg total) every Tuesday, Thursday, Saturday, and Sunday. You can split to 1500 mg dose into 2 tablets in the AM and 1 tablet PM. 80 capsule 5   Vitamin D , Ergocalciferol , (DRISDOL ) 1.25 MG (50000 UNIT) CAPS capsule Take 1 capsule (50,000 Units total) by mouth every 14 (fourteen) days. 6 capsule 3   No current facility-administered medications for this visit.    ALLERGIES:  Allergies  Allergen Reactions   Sulfa Antibiotics     Rash   Tegretol [Carbamazepine] Hives and Other (See Comments)    headache    LABORATORY DATA:  I have reviewed the labs as listed.     Latest Ref Rng & Units 04/22/2024   11:37 AM 12/29/2023    3:12 AM 12/28/2023   10:49 AM  CBC  WBC 4.0 - 10.5 K/uL 5.0  7.5  6.8   Hemoglobin 12.0 - 15.0 g/dL 87.2  86.3  84.8   Hematocrit 36.0 - 46.0 % 37.9  39.6  42.6   Platelets 150 - 400 K/uL 164  188  203       Latest Ref Rng & Units 04/22/2024   11:37 AM 12/29/2023    3:12 AM 12/28/2023   10:49 AM  CMP  Glucose 70 - 99 mg/dL 97  90  896   BUN 8 - 23 mg/dL 15  12  15    Creatinine 0.44 - 1.00 mg/dL 9.40  9.55  9.38   Sodium 135 - 145 mmol/L 137  138  139   Potassium 3.5 - 5.1 mmol/L 4.4  3.8  4.0   Chloride 98 - 111 mmol/L 102  105  100   CO2 22 - 32 mmol/L 27  26  29    Calcium  8.9 - 10.3 mg/dL 9.2  8.9  9.7   Total Protein 6.5 - 8.1 g/dL 6.6  6.1  7.2   Total Bilirubin 0.0 - 1.2 mg/dL 1.0  1.2  1.3   Alkaline Phos 38 - 126 U/L 91  73  83   AST 15 - 41 U/L 16  15  16    ALT 0 - 44 U/L 12  13  14      DIAGNOSTIC IMAGING:  I have independently reviewed the scans and discussed with the patient. No results found.    WRAP UP:  All questions were answered. The patient knows to call the clinic with any problems, questions or concerns.  Medical decision making: Moderate  Time spent on visit: I spent 20 minutes counseling the patient face to face. The total time spent in the appointment was  30 minutes and more than 50% was on counseling.  Pleasant CHRISTELLA Barefoot, PA-C  04/22/24 1:31 PM

## 2024-04-22 ENCOUNTER — Inpatient Hospital Stay

## 2024-04-22 ENCOUNTER — Inpatient Hospital Stay: Attending: Hematology | Admitting: Physician Assistant

## 2024-04-22 VITALS — BP 128/69 | HR 69 | Temp 98.1°F | Resp 20 | Wt 229.3 lb

## 2024-04-22 DIAGNOSIS — E559 Vitamin D deficiency, unspecified: Secondary | ICD-10-CM | POA: Diagnosis not present

## 2024-04-22 DIAGNOSIS — Z90710 Acquired absence of both cervix and uterus: Secondary | ICD-10-CM | POA: Insufficient documentation

## 2024-04-22 DIAGNOSIS — R0602 Shortness of breath: Secondary | ICD-10-CM | POA: Insufficient documentation

## 2024-04-22 DIAGNOSIS — Z801 Family history of malignant neoplasm of trachea, bronchus and lung: Secondary | ICD-10-CM | POA: Insufficient documentation

## 2024-04-22 DIAGNOSIS — D473 Essential (hemorrhagic) thrombocythemia: Secondary | ICD-10-CM | POA: Diagnosis not present

## 2024-04-22 DIAGNOSIS — R519 Headache, unspecified: Secondary | ICD-10-CM | POA: Insufficient documentation

## 2024-04-22 DIAGNOSIS — R059 Cough, unspecified: Secondary | ICD-10-CM | POA: Insufficient documentation

## 2024-04-22 DIAGNOSIS — Z1722 Progesterone receptor negative status: Secondary | ICD-10-CM | POA: Insufficient documentation

## 2024-04-22 DIAGNOSIS — C50412 Malignant neoplasm of upper-outer quadrant of left female breast: Secondary | ICD-10-CM | POA: Insufficient documentation

## 2024-04-22 DIAGNOSIS — M8580 Other specified disorders of bone density and structure, unspecified site: Secondary | ICD-10-CM | POA: Insufficient documentation

## 2024-04-22 DIAGNOSIS — R232 Flushing: Secondary | ICD-10-CM | POA: Insufficient documentation

## 2024-04-22 DIAGNOSIS — Z8261 Family history of arthritis: Secondary | ICD-10-CM | POA: Insufficient documentation

## 2024-04-22 DIAGNOSIS — Z1732 Human epidermal growth factor receptor 2 negative status: Secondary | ICD-10-CM | POA: Diagnosis not present

## 2024-04-22 DIAGNOSIS — D45 Polycythemia vera: Secondary | ICD-10-CM | POA: Insufficient documentation

## 2024-04-22 DIAGNOSIS — Z8542 Personal history of malignant neoplasm of other parts of uterus: Secondary | ICD-10-CM | POA: Insufficient documentation

## 2024-04-22 DIAGNOSIS — Z8673 Personal history of transient ischemic attack (TIA), and cerebral infarction without residual deficits: Secondary | ICD-10-CM | POA: Insufficient documentation

## 2024-04-22 DIAGNOSIS — R252 Cramp and spasm: Secondary | ICD-10-CM | POA: Diagnosis not present

## 2024-04-22 DIAGNOSIS — G629 Polyneuropathy, unspecified: Secondary | ICD-10-CM | POA: Diagnosis not present

## 2024-04-22 DIAGNOSIS — Z7982 Long term (current) use of aspirin: Secondary | ICD-10-CM | POA: Insufficient documentation

## 2024-04-22 DIAGNOSIS — Z79811 Long term (current) use of aromatase inhibitors: Secondary | ICD-10-CM | POA: Diagnosis not present

## 2024-04-22 DIAGNOSIS — Z79899 Other long term (current) drug therapy: Secondary | ICD-10-CM | POA: Diagnosis not present

## 2024-04-22 DIAGNOSIS — Z170 Estrogen receptor positive status [ER+]: Secondary | ICD-10-CM | POA: Insufficient documentation

## 2024-04-22 DIAGNOSIS — R5383 Other fatigue: Secondary | ICD-10-CM | POA: Insufficient documentation

## 2024-04-22 DIAGNOSIS — D75839 Thrombocytosis, unspecified: Secondary | ICD-10-CM | POA: Insufficient documentation

## 2024-04-22 DIAGNOSIS — M7989 Other specified soft tissue disorders: Secondary | ICD-10-CM | POA: Diagnosis not present

## 2024-04-22 DIAGNOSIS — Z882 Allergy status to sulfonamides status: Secondary | ICD-10-CM | POA: Diagnosis not present

## 2024-04-22 DIAGNOSIS — H538 Other visual disturbances: Secondary | ICD-10-CM | POA: Insufficient documentation

## 2024-04-22 DIAGNOSIS — Z90721 Acquired absence of ovaries, unilateral: Secondary | ICD-10-CM | POA: Diagnosis not present

## 2024-04-22 DIAGNOSIS — Z8249 Family history of ischemic heart disease and other diseases of the circulatory system: Secondary | ICD-10-CM | POA: Insufficient documentation

## 2024-04-22 LAB — COMPREHENSIVE METABOLIC PANEL WITH GFR
ALT: 12 U/L (ref 0–44)
AST: 16 U/L (ref 15–41)
Albumin: 3.4 g/dL — ABNORMAL LOW (ref 3.5–5.0)
Alkaline Phosphatase: 91 U/L (ref 38–126)
Anion gap: 8 (ref 5–15)
BUN: 15 mg/dL (ref 8–23)
CO2: 27 mmol/L (ref 22–32)
Calcium: 9.2 mg/dL (ref 8.9–10.3)
Chloride: 102 mmol/L (ref 98–111)
Creatinine, Ser: 0.59 mg/dL (ref 0.44–1.00)
GFR, Estimated: >60 mL/min (ref >60)
Glucose, Bld: 97 mg/dL (ref 70–99)
Potassium: 4.4 mmol/L (ref 3.5–5.1)
Sodium: 137 mmol/L (ref 135–145)
Total Bilirubin: 1 mg/dL (ref 0.0–1.2)
Total Protein: 6.6 g/dL (ref 6.5–8.1)

## 2024-04-22 LAB — CBC WITH DIFFERENTIAL/PLATELET
Abs Immature Granulocytes: 0.02 10*3/uL (ref 0.00–0.07)
Basophils Absolute: 0 10*3/uL (ref 0.0–0.1)
Basophils Relative: 1 %
Eosinophils Absolute: 0.1 10*3/uL (ref 0.0–0.5)
Eosinophils Relative: 2 %
HCT: 37.9 % (ref 36.0–46.0)
Hemoglobin: 12.7 g/dL (ref 12.0–15.0)
Immature Granulocytes: 0 %
Lymphocytes Relative: 23 %
Lymphs Abs: 1.1 10*3/uL (ref 0.7–4.0)
MCH: 42.2 pg — ABNORMAL HIGH (ref 26.0–34.0)
MCHC: 33.5 g/dL (ref 30.0–36.0)
MCV: 125.9 fL — ABNORMAL HIGH (ref 80.0–100.0)
Monocytes Absolute: 0.5 10*3/uL (ref 0.1–1.0)
Monocytes Relative: 11 %
Neutro Abs: 3.2 10*3/uL (ref 1.7–7.7)
Neutrophils Relative %: 63 %
Platelets: 164 10*3/uL (ref 150–400)
RBC: 3.01 MIL/uL — ABNORMAL LOW (ref 3.87–5.11)
RDW: 12.4 % (ref 11.5–15.5)
WBC: 5 10*3/uL (ref 4.0–10.5)
nRBC: 0 % (ref 0.0–0.2)

## 2024-04-22 LAB — LACTATE DEHYDROGENASE: LDH: 155 U/L (ref 98–192)

## 2024-04-22 MED ORDER — VITAMIN D (ERGOCALCIFEROL) 1.25 MG (50000 UNIT) PO CAPS: 50000.0000 [IU] | ORAL_CAPSULE | ORAL | 3 refills | Status: DC

## 2024-04-22 MED ORDER — HYDROXYUREA 500 MG PO CAPS: ORAL_CAPSULE | ORAL | 3 refills | Status: DC

## 2024-04-22 MED ORDER — HYDROXYUREA 500 MG PO CAPS: ORAL_CAPSULE | ORAL | 5 refills | Status: DC

## 2024-04-22 NOTE — Addendum Note (Signed)
 Addended by: Sheril Dines on: 04/22/2024 01:33 PM   Modules accepted: Orders

## 2024-04-22 NOTE — Patient Instructions (Signed)
 [facility] at [facility] Discharge Instructions  You were seen today by Sheril Dines PA-C for the following conditions.  ESSENTIAL THROMBOCYTOSIS / POLYCYTHEMIA VERA: Your blood counts look excellent!  We will continue you on the same dose of Hydrea .   Take Hydrea  1000 mg (2 tablets) every morning on Monday, Wednesday, and Friday. Take Hydrea  1000 mg (2 tablets) every morning PLUS 500 mg (1 tablet) every evening on Tuesday, Thursday, Saturday, and Sunday. We will check labs and see you for office visit again in 3 months, and see if you need additional dose adjustment. Continue aspirin  81 mg daily. If you have any new symptoms or side effects before your next visit, please call our office to let us  know!  HISTORY OF LEFT BREAST CANCER: - Continue to take letrozole  (Femara ) as prescribed for your breast cancer. - You did not have any evidence of returning breast cancer on your most recent mammogram or physical exam. - Your mammogram will be due in July 2025  BONE HEALTH: Your bone density scan shows some slight worsening of your bone density, which now classifies as OSTEOPENIA.  Your osteopenia (weakened bones) is mild, but does place you at an increased risk of fracture.  However, you do not need any medical treatment of your osteopenia at this time.  We will check repeat bone density scan in 2 years.  MEDICATIONS: - Continue hydroxyurea  (Hydrea ) as above - Continue vitamin D  every other week.  FOLLOW-UP APPOINTMENT: Office visit in 3 months, after labs   Thank you for choosing [facility] at [facility] to provide your oncology and hematology care.  To afford each patient quality time with our provider, please arrive at least 15 minutes before your scheduled appointment time.   If you have a lab appointment with the Cancer Center please come in thru the Main Entrance and check in at the main information desk.  You need to re-schedule  your appointment should you arrive 10 or more minutes late.  We strive to give you quality time with our providers, and arriving late affects you and other patients whose appointments are after yours.  Also, if you no show three or more times for appointments you may be dismissed from the clinic at the providers discretion.     Again, thank you for choosing [facility].  Our hope is that these requests will decrease the amount of time that you wait before being seen by our physicians.       _____________________________________________________________  Should you have questions after your visit to [facility], please contact our office at [phone] and follow the prompts.  Our office hours are 8:00 a.m. and 4:30 p.m. Monday - Friday.  Please note that voicemails left after 4:00 p.m. may not be returned until the following business day.  We are closed weekends and major holidays.  You do have access to a nurse 24-7, just call the main number to the clinic [phone] and do not press any options, hold on the line and a nurse will answer the phone.    For prescription refill requests, have your pharmacy contact our office and allow 72 hours.    Due to Covid, you will need to wear a mask upon entering the hospital. If you do not have a mask, a mask will be given to you at the Main Entrance upon arrival. For doctor visits, patients may have 1 support person age 84  or older with them. For treatment visits, patients can not have anyone with them due to social distancing guidelines and our immunocompromised population.

## 2024-04-23 DIAGNOSIS — J9611 Chronic respiratory failure with hypoxia: Secondary | ICD-10-CM | POA: Diagnosis not present

## 2024-04-23 DIAGNOSIS — J449 Chronic obstructive pulmonary disease, unspecified: Secondary | ICD-10-CM | POA: Diagnosis not present

## 2024-04-26 ENCOUNTER — Other Ambulatory Visit: Payer: Self-pay | Admitting: Hematology

## 2024-04-26 DIAGNOSIS — Z170 Estrogen receptor positive status [ER+]: Secondary | ICD-10-CM

## 2024-05-19 DIAGNOSIS — H353221 Exudative age-related macular degeneration, left eye, with active choroidal neovascularization: Secondary | ICD-10-CM | POA: Diagnosis not present

## 2024-05-23 DIAGNOSIS — J449 Chronic obstructive pulmonary disease, unspecified: Secondary | ICD-10-CM | POA: Diagnosis not present

## 2024-05-23 DIAGNOSIS — J9611 Chronic respiratory failure with hypoxia: Secondary | ICD-10-CM | POA: Diagnosis not present

## 2024-05-27 ENCOUNTER — Telehealth: Payer: Self-pay

## 2024-05-27 NOTE — Telephone Encounter (Signed)
 Up to date on atorvastatin  and Jardiance , overdue on losartan . Coordinated with PCP to send updated refills to pharmacy

## 2024-05-28 DIAGNOSIS — H353211 Exudative age-related macular degeneration, right eye, with active choroidal neovascularization: Secondary | ICD-10-CM | POA: Diagnosis not present

## 2024-06-02 ENCOUNTER — Ambulatory Visit (HOSPITAL_COMMUNITY)
Admission: RE | Admit: 2024-06-02 | Discharge: 2024-06-02 | Disposition: A | Discharge status: Home / Self Care | Source: Ambulatory Visit | Attending: Physician Assistant | Admitting: Physician Assistant

## 2024-06-02 DIAGNOSIS — Z1231 Encounter for screening mammogram for malignant neoplasm of breast: Secondary | ICD-10-CM | POA: Insufficient documentation

## 2024-06-02 DIAGNOSIS — Z853 Personal history of malignant neoplasm of breast: Secondary | ICD-10-CM | POA: Diagnosis not present

## 2024-06-02 DIAGNOSIS — D45 Polycythemia vera: Secondary | ICD-10-CM | POA: Insufficient documentation

## 2024-06-02 DIAGNOSIS — E559 Vitamin D deficiency, unspecified: Secondary | ICD-10-CM | POA: Insufficient documentation

## 2024-06-02 DIAGNOSIS — D473 Essential (hemorrhagic) thrombocythemia: Secondary | ICD-10-CM | POA: Insufficient documentation

## 2024-06-02 DIAGNOSIS — C50412 Malignant neoplasm of upper-outer quadrant of left female breast: Secondary | ICD-10-CM

## 2024-06-03 ENCOUNTER — Encounter: Payer: Self-pay | Admitting: Cardiology

## 2024-06-03 ENCOUNTER — Ambulatory Visit: Attending: Cardiology | Admitting: Cardiology

## 2024-06-03 VITALS — BP 128/68 | HR 65 | Ht 64.0 in | Wt 228.6 lb

## 2024-06-03 DIAGNOSIS — I272 Pulmonary hypertension, unspecified: Secondary | ICD-10-CM

## 2024-06-03 DIAGNOSIS — I259 Chronic ischemic heart disease, unspecified: Secondary | ICD-10-CM

## 2024-06-03 DIAGNOSIS — I350 Nonrheumatic aortic (valve) stenosis: Secondary | ICD-10-CM | POA: Diagnosis not present

## 2024-06-03 DIAGNOSIS — I890 Lymphedema, not elsewhere classified: Secondary | ICD-10-CM

## 2024-06-03 DIAGNOSIS — I10 Essential (primary) hypertension: Secondary | ICD-10-CM

## 2024-06-03 DIAGNOSIS — I5032 Chronic diastolic (congestive) heart failure: Secondary | ICD-10-CM

## 2024-06-03 NOTE — Progress Notes (Signed)
 Cardiology Office Note  Date: 06/03/2024   ID: [name], [name] 07-11-1941, MRN [id number]  History of Present Illness: [name] is an 83 y.o. female last seen in January by Ms. Strader PA-C, I reviewed her note.  She is here for a routine visit.  States that she has been doing reasonably well, NYHA class II dyspnea, no progressive fluid retention.  She works 8 AM to 1 PM during the week for [employer].  We went over her medications.  No change in diuretic plan, currently Lasix  40 mg in the morning and 20 mg in the afternoon in addition to baseline Jardiance  and Aldactone .  Her blood pressure is well-controlled today and her weight is stable.  I reviewed her interval lab work which is noted below.  I reviewed her ECG today which shows sinus rhythm with right bundle branch block and LVH.  Physical Exam: VS:  BP 128/68 (BP Location: Left Arm, Cuff Size: Large)   Pulse 65   Ht 5' 4 (1.626 m)   Wt 228 lb 9.6 oz (103.7 kg)   SpO2 92%   BMI 39.24 kg/m , BMI Body mass index is 39.24 kg/m.  Wt Readings from Last 3 Encounters:  06/03/24 228 lb 9.6 oz (103.7 kg)  04/22/24 229 lb 4.5 oz (104 kg)  03/11/24 227 lb 3.2 oz (103.1 kg)    General: Patient appears comfortable at rest. HEENT: Conjunctiva and lids normal. Neck: Supple, no elevated JVP or carotid bruits. Lungs: Clear to auscultation, nonlabored breathing at rest. Cardiac: Regular rate and rhythm, no S3, 3/6 systolic murmur, no pericardial rub. Extremities: Stable appearing lymphedema.  ECG:  An ECG dated 08/06/2023 was personally reviewed today and demonstrated:  Sinus rhythm with left atrial enlargement and right bundle branch block.  Labwork: 08/07/2023: B Natriuretic Peptide 117.0 12/29/2023: Magnesium  2.0 04/22/2024: ALT 12; AST 16; BUN 15; Creatinine, Ser 0.59; Hemoglobin 12.7; Platelets 164; Potassium 4.4; Sodium 137     Component Value Date/Time   CHOL 137 08/07/2023 0413   TRIG 83 08/07/2023  0413   HDL 46 08/07/2023 0413   CHOLHDL 3.0 08/07/2023 0413   VLDL 17 08/07/2023 0413   LDLCALC 74 08/07/2023 0413  November 2024: Cholesterol 128, triglycerides 94, HDL 49, LDL 61  Other Studies Reviewed Today:  No interval cardiac testing for review today.  Assessment and Plan:  1.  History of severe pulmonary hypertension, most likely WHO group 2 and 3 based on workup so far.  Plan is medical therapy at this point following previous discussions.  Follow-up echocardiogram in October 2024 revealed vigorous LVEF with moderate asymmetric septal hypertrophy and grade 1 diastolic dysfunction.  RV contraction normal with estimated RVSP approximately 47 mmHg (improved).  She is clinically stable with NYHA class II symptoms and no progressive fluid retention.  Continue Lasix  40 mg in the morning and 20 mg in the afternoon, Jardiance  10 mg daily, and Aldactone  12.5 mg daily.   2.  Ischemic heart disease based on prior noninvasive cardiac imaging.  No angina at current level of activity.  Continue aspirin  81 mg daily, Lipitor 40 mg daily, and Imdur  15 mg daily.  Last LDL was 61 in November 2024.   3.  Primary hypertension.  Blood pressure well-controlled today.  She is also on Cozaar  100 mg daily, clonidine  0.15 mg twice daily, and Cardizem  CD 180 mg daily.   4.  Lymphedema.  Stable on current diuretic regimen.   5.  Aortic  stenosis, mild by echocardiogram in October 2024, mean AV gradient 12.5 mmHg.SABRA  She has a prominent systolic murmur which is most likely combination of asymmetric septal LV hypertrophy with hyperdynamic LVEF, PCV, and aortic stenosis.  Disposition:  Follow up 6 months.  Signed, Jayson JUDITHANN Sierras, M.D., F.A.C.C. [facility] at [facility]

## 2024-06-03 NOTE — Patient Instructions (Signed)
 Medication Instructions:  Your physician recommends that you continue on your current medications as directed. Please refer to the Current Medication list given to you today.   Labwork: None today  Testing/Procedures: None today  Follow-Up: 6 months  Any Other Special Instructions Will Be Listed Below (If Applicable).  If you need a refill on your cardiac medications before your next appointment, please call your pharmacy.

## 2024-06-08 ENCOUNTER — Other Ambulatory Visit: Payer: Self-pay

## 2024-06-08 ENCOUNTER — Emergency Department (HOSPITAL_COMMUNITY)
Admission: EM | Admit: 2024-06-08 | Discharge: 2024-06-08 | Disposition: A | Discharge status: Home / Self Care | Attending: Emergency Medicine | Admitting: Emergency Medicine

## 2024-06-08 ENCOUNTER — Encounter (HOSPITAL_COMMUNITY): Payer: Self-pay | Admitting: Emergency Medicine

## 2024-06-08 DIAGNOSIS — I5032 Chronic diastolic (congestive) heart failure: Secondary | ICD-10-CM | POA: Insufficient documentation

## 2024-06-08 DIAGNOSIS — I110 Hypertensive heart disease with heart failure: Secondary | ICD-10-CM | POA: Insufficient documentation

## 2024-06-08 DIAGNOSIS — Z79899 Other long term (current) drug therapy: Secondary | ICD-10-CM | POA: Insufficient documentation

## 2024-06-08 DIAGNOSIS — M542 Cervicalgia: Secondary | ICD-10-CM | POA: Insufficient documentation

## 2024-06-08 DIAGNOSIS — I10 Essential (primary) hypertension: Secondary | ICD-10-CM | POA: Diagnosis not present

## 2024-06-08 MED ORDER — DEXAMETHASONE SODIUM PHOSPHATE 10 MG/ML IJ SOLN
10.0000 mg | Freq: Once | INTRAMUSCULAR | Status: AC
  Administered 2024-06-08: 10 mg via INTRAMUSCULAR
  Filled 2024-06-08: qty 1

## 2024-06-08 MED ORDER — METHYLPREDNISOLONE 4 MG PO TBPK: ORAL_TABLET | ORAL | 0 refills | Status: AC

## 2024-06-08 MED ORDER — OXYCODONE-ACETAMINOPHEN 5-325 MG PO TABS
1.0000 | ORAL_TABLET | Freq: Once | ORAL | Status: AC
  Administered 2024-06-08: 1 via ORAL
  Filled 2024-06-08: qty 1

## 2024-06-08 NOTE — ED Triage Notes (Signed)
 Pt with c/o L sided neck and shoulder pain that started yesterday and has progressively gotten worse. Denies injury. States has been taking Aleve and using Biofreeze without any relief. States she has had injections in neck in the past by Dr Margrette for arthritis.

## 2024-06-08 NOTE — Discharge Instructions (Signed)
 You were seen today for left-sided neck pain.  This is likely related to spasm or arthritis.  Take Medrol  Dosepak.  Start this on Monday.  Follow-up with Dr. Margrette.

## 2024-06-08 NOTE — ED Provider Notes (Signed)
 [place] EMERGENCY DEPARTMENT AT [facility] Provider Note   CSN: [id number] Arrival date & time: 06/08/24  9553     Patient presents with: Neck and Shoulder Pain   [name] is a 83 y.o. female.   HPI     This an 83 year old female who presents with left-sided neck pain and shoulder pain.  Patient reports history of chronic neck and shoulder pain.  She has seen Dr. Margrette and had neck injections in the past.  She states that sometimes she has pain on the right and sometimes she has pain on the left.  Pain started yesterday.  She does not note any injury or heavy lifting.  It is mostly in the left shoulder and radiates into the left neck.  Denies weakness, numbness, tingling of the arms.  Took an Aleve with minimal relief.  Prior to Admission medications   Medication Sig Start Date End Date Taking? Authorizing Provider  methylPREDNISolone  (MEDROL  DOSEPAK) 4 MG TBPK tablet Take as directed on package 06/08/24  Yes Jezlyn Westerfield, Charmaine FALCON, MD  aspirin  EC 81 MG tablet Take 1 tablet (81 mg total) by mouth at bedtime. 01/06/24   Pappayliou, Dorothyann A, DO  atorvastatin  (LIPITOR) 40 MG tablet Take 1 tablet (40 mg total) by mouth daily. 08/07/23   Johnson, Clanford L, MD  bisacodyl  (DULCOLAX) 10 MG suppository Place 1 suppository (10 mg total) rectally daily as needed for moderate constipation or severe constipation. 12/29/23   Maree, Pratik D, DO  carvedilol  (COREG ) 12.5 MG tablet Take 0.5 tablets (6.25 mg total) by mouth 2 (two) times daily with a meal. Take 1/2 tablet by 2 times daily 08/07/23   Johnson, Clanford L, MD  cloNIDine  (CATAPRES ) 0.3 MG tablet Take 0.15 mg by mouth 2 (two) times daily.    [provider]  Cyanocobalamin  (VITAMIN B 12 PO) Take by mouth.    [provider]  diltiazem  (CARDIZEM  CD) 180 MG 24 hr capsule Take 180 mg by mouth daily.  03/05/13   [provider]  empagliflozin  (JARDIANCE ) 10 MG TABS tablet Take 1 tablet (10 mg total) by  mouth daily before breakfast. 11/13/23   Debera Jayson MATSU, MD  esomeprazole  (NEXIUM ) 40 MG capsule Take 1 capsule (40 mg total) by mouth every morning. 09/15/14   Setzer, Veva CROME, NP  famotidine  (PEPCID ) 20 MG tablet One after supper 06/14/23   Darlean Ozell NOVAK, MD  furosemide  (LASIX ) 20 MG tablet Take 20 mg by mouth daily. Take with 40 mg to get 60 mg dose daily    [provider]  furosemide  (LASIX ) 40 MG tablet Take 1 tablet (40 mg total) by mouth daily. 08/07/23   Vicci Afton CROME, MD  hydroxyurea  (HYDREA ) 500 MG capsule Take 2 capsules (1,000 mg total) by mouth every Monday, Wednesday, and Friday AND 3 capsules (1,500 mg total) every Tuesday, Thursday, Saturday, and Sunday. You can split to 1500 mg dose into 2 tablets in the AM and 1 tablet PM. 04/22/24   Lamon Pleasant HERO, PA-C  isosorbide  mononitrate (IMDUR ) 30 MG 24 hr tablet TAKE 1/2 (ONE-HALF) TABLET BY MOUTH AT BEDTIME 01/30/24   Debera Jayson MATSU, MD  letrozole  (FEMARA ) 2.5 MG tablet Take 1 tablet by mouth once daily 09/19/23   Pennington, Rebekah M, PA-C  losartan  (COZAAR ) 100 MG tablet Take 100 mg by mouth every evening.  02/25/13   [provider]  Multiple Vitamins-Minerals (PRESERVISION AREDS PO) Take 1 capsule by mouth 2 (two) times daily at  10 AM and 5 PM.    [provider]  polyethylene glycol (MIRALAX  / GLYCOLAX ) 17 g packet Take 17 g by mouth daily. 12/30/23   Maree, Pratik D, DO  senna-docusate (SENOKOT-S) 8.6-50 MG tablet Take 1 tablet by mouth 2 (two) times daily. 12/29/23   Maree, Pratik D, DO  Simethicone  (GAS RELIEF PO) Take 1 tablet by mouth daily.    [provider]  spironolactone  (ALDACTONE ) 25 MG tablet Take 12.5 mg by mouth daily.    [provider]  Vitamin D , Ergocalciferol , (DRISDOL ) 1.25 MG (50000 UNIT) CAPS capsule TAKE 1 CAPSULE BY MOUTH EVERY TWO WEEKS 04/28/24   Rogers Hai, MD    Allergies: Sulfa antibiotics and Tegretol [carbamazepine]    Review of  Systems  Cardiovascular:  Negative for chest pain.  Musculoskeletal:        Shoulder and neck pain  Neurological:  Negative for weakness.  All other systems reviewed and are negative.   Updated Vital Signs BP 108/62   Pulse 71   Temp 98.1 F (36.7 C) (Oral)   Resp 20   Ht 1.626 m (5' 4)   Wt 103.4 kg   SpO2 100%   BMI 39.14 kg/m   Physical Exam Vitals and nursing note reviewed.  Constitutional:      Appearance: She is well-developed. She is obese. She is not ill-appearing.  HENT:     Head: Normocephalic and atraumatic.  Eyes:     Pupils: Pupils are equal, round, and reactive to light.  Cardiovascular:     Rate and Rhythm: Normal rate and regular rhythm.  Pulmonary:     Effort: Pulmonary effort is normal. No respiratory distress.  Abdominal:     Palpations: Abdomen is soft.  Musculoskeletal:     Cervical back: Neck supple.     Comments: Tenderness to palpation along the left trapezius into the neck, normal range of motion of the left shoulder  Skin:    General: Skin is warm and dry.  Neurological:     Mental Status: She is alert and oriented to person, place, and time.     Comments: 5 out of 5 grip, bicep, tricep strength on the left  Psychiatric:        Mood and Affect: Mood normal.     (all labs ordered are listed, but only abnormal results are displayed) Labs Reviewed - No data to display  EKG: EKG Interpretation Date/Time:  Sunday June 08 2024 05:13:45 EDT Ventricular Rate:  69 PR Interval:  171 QRS Duration:  151 QT Interval:  410 QTC Calculation: 440 R Axis:   -37  Text Interpretation: Sinus rhythm Right bundle branch block Confirmed by Bari Pfeiffer ([id number]) on 06/08/2024 6:15:29 AM  Radiology: No results found.   Procedures   Medications Ordered in the ED  oxyCODONE -acetaminophen  (PERCOCET/ROXICET) 5-325 MG per tablet 1 tablet (1 tablet Oral Given 06/08/24 0515)  dexamethasone  (DECADRON ) injection 10 mg (10 mg Intramuscular Given 06/08/24  0515)                                    Medical Decision Making Risk Prescription drug management.   This patient presents to the ED for concern of left shoulder and neck pain, this involves an extensive number of treatment options, and is a complaint that carries with it a high risk of complications and morbidity.  I considered the following differential and admission for  this acute, potentially life threatening condition.  The differential diagnosis includes muscle spasm, arthritis, radiculopathy, less likely ACS  MDM:    This is a 83 year old female who presents with left neck and shoulder pain.  History of the same.  She is nontoxic and vital signs are reassuring.  EKG shows no evidence of acute ischemia or arrhythmia.  Doubt ACS.  She has very reproducible pain and discomfort on exam.  She is given a Percocet and IM Decadron .  No history of diabetes.  On recheck, she reports improvement of pain.  Will treat with Medrol  Dosepak for anti-inflammation.  Recommend she follow-up with Dr. Margrette.  She is neurologically intact without weakness.  Do not feel she needs advanced imaging at this time.  (Labs, imaging, consults)  Labs: I Ordered, and personally interpreted labs.  The pertinent results include: None  Imaging Studies ordered: I ordered imaging studies including none I independently visualized and interpreted imaging. I agree with the radiologist interpretation  Additional history obtained from chart review.  External records from outside source obtained and reviewed including orthopedic notes  Cardiac Monitoring: The patient was not maintained on a cardiac monitor.  If on the cardiac monitor, I personally viewed and interpreted the cardiac monitored which showed an underlying rhythm of: N/A  Reevaluation: After the interventions noted above, I reevaluated the patient and found that they have :improved  Social Determinants of Health:  lives independently  Disposition:  Discharge  Co morbidities that complicate the patient evaluation  Past Medical History:  Diagnosis Date   Arthritis    Chronic diastolic heart failure (HCC)    Essential hypertension    GERD (gastroesophageal reflux disease)    Hiatal hernia    History of endometrial cancer    Sp hysterectomy 2012   Ischemic colitis (HCC) 2016   Macular degeneration of both eyes    wet   Myeloproliferative disorder, JAK-2 positive    Obesity    Pneumonia    Polycythemia vera(238.4)    TIA (transient ischemic attack)      Medicines Meds ordered this encounter  Medications   oxyCODONE -acetaminophen  (PERCOCET/ROXICET) 5-325 MG per tablet 1 tablet    Refill:  0   dexamethasone  (DECADRON ) injection 10 mg   methylPREDNISolone  (MEDROL  DOSEPAK) 4 MG TBPK tablet    Sig: Take as directed on package    Dispense:  21 tablet    Refill:  0    I have reviewed the patients home medicines and have made adjustments as needed  Problem List / ED Course: Problem List Items Addressed This Visit   None Visit Diagnoses       Neck pain on left side    -  Primary                Final diagnoses:  Neck pain on left side    ED Discharge Orders          Ordered    methylPREDNISolone  (MEDROL  DOSEPAK) 4 MG TBPK tablet        06/08/24 0605               Bari Charmaine FALCON, MD 06/08/24 [phone]

## 2024-06-08 NOTE — ED Notes (Addendum)
 This nurse was getting the pt ready for d/c by getting last set of VS. Pt became a little pale with sweating. Her BP dropped to 89/50. Pt also became slightly tachypneic with breathing about 26-28 times a minute. This nurse got the pt to lay down and instructed pt to take slow deep breathes. Pt's s/s slowly improved. BP became 115/62 and pt breathing 22 times a min. Provider notified of episode and wants to monitor pt a little longer.

## 2024-06-11 DIAGNOSIS — Z961 Presence of intraocular lens: Secondary | ICD-10-CM | POA: Diagnosis not present

## 2024-06-11 DIAGNOSIS — H26493 Other secondary cataract, bilateral: Secondary | ICD-10-CM | POA: Diagnosis not present

## 2024-06-11 DIAGNOSIS — H524 Presbyopia: Secondary | ICD-10-CM | POA: Diagnosis not present

## 2024-06-11 DIAGNOSIS — L821 Other seborrheic keratosis: Secondary | ICD-10-CM | POA: Diagnosis not present

## 2024-06-11 DIAGNOSIS — H353231 Exudative age-related macular degeneration, bilateral, with active choroidal neovascularization: Secondary | ICD-10-CM | POA: Diagnosis not present

## 2024-06-14 ENCOUNTER — Other Ambulatory Visit: Payer: Self-pay | Admitting: Cardiology

## 2024-06-19 ENCOUNTER — Ambulatory Visit (INDEPENDENT_AMBULATORY_CARE_PROVIDER_SITE_OTHER): Admitting: Orthopedic Surgery

## 2024-06-19 ENCOUNTER — Encounter: Payer: Self-pay | Admitting: Orthopedic Surgery

## 2024-06-19 VITALS — BP 128/53 | Ht 64.0 in | Wt 228.0 lb

## 2024-06-19 DIAGNOSIS — M542 Cervicalgia: Secondary | ICD-10-CM

## 2024-06-19 DIAGNOSIS — F40240 Claustrophobia: Secondary | ICD-10-CM

## 2024-06-19 DIAGNOSIS — M792 Neuralgia and neuritis, unspecified: Secondary | ICD-10-CM

## 2024-06-19 MED ORDER — DIAZEPAM 5 MG PO TABS: 5.0000 mg | ORAL_TABLET | Freq: Once | ORAL | 0 refills | Status: AC

## 2024-06-19 NOTE — Progress Notes (Signed)
  Intake history:  BP (!) 128/53 Comment: 06/08/24  Ht 5' 4 (1.626 m)   Wt 228 lb (103.4 kg)   BMI 39.14 kg/m  Body mass index is 39.14 kg/m.    WHAT ARE WE SEEING YOU FOR TODAY?   neck  How long has this bothered you? (DOI?DOS?WS?)  on 06/08/24 went to the ER for pain neck into left shoulder Trapezius pain better with steroid taper but now pain going down right arm left trapezius area still feels sore   Was there an injury? No  Anticoag.  No  Diabetes No  Heart disease Yes  Hypertension Yes and Lung disease   SMOKING HX No  Kidney disease No  Any ALLERGIES ________ Allergies  Allergen Reactions   Sulfa Antibiotics     Rash   Tegretol [Carbamazepine] Hives and Other (See Comments)    headache   ______________________________________   Treatment:  Have you taken:  Tylenol Yes  Advil No  Had PT No  Had injection No  Other  _______________took steroid taper (4mg ) helped and had injection in ER that also helped __________

## 2024-06-19 NOTE — Patient Instructions (Signed)
 While we are working on your approval for MRI please go ahead and call to schedule your appointment with [facility] within at least one (1) week.   Central Scheduling [phone]

## 2024-06-19 NOTE — Progress Notes (Signed)
 Chief Complaint  Patient presents with   Neck Pain   ER VISIT: 06/08/24 This an 83 year old female who presents with left-sided neck pain and shoulder pain. Patient reports history of chronic neck and shoulder pain. She has seen Dr. Margrette and had neck injections in the past. She states that sometimes she has pain on the right and sometimes she has pain on the left. Pain started yesterday. She does not note any injury or heavy lifting. It is mostly in the left shoulder and radiates into the left neck. Denies weakness, numbness, tingling of the arms. Took an Aleve with minimal relief.   This is an 83 year old female with recurrent pain in her cervical spine with radiculopathy of the left and right upper extremity which was improved with oral Medrol  Dosepak and treatment she received in the ER she is here for follow-up  Today her pain is radicular in nature running down the right arm  Reviewing her prior images as noted below indicate the patient has cervical radiculopathy and would benefit from MRI to plan treatment with the options of oral medication such as Tylenol  gabapentin  anti-inflammatories versus injection versus surgical intervention  Physical exam suggests that surgery is not necessary at this time  She exhibited the following  She had forward elevation of the right shoulder 150 degrees no weakness in the right upper extremity no neurologic symptoms.  She had decreased cervical range of motion and tenderness noted   IMAGING: 2024 C-spine imaging 3 views   Patient planes of neck and shoulder pain   X-rays show multiple levels of joint space narrowing endplate changes osteophytes sclerosis malalignment   Impression severe spondylosis C-spine  IMAGING: 2024 Left shoulder pain   AP lateral left shoulder   X-rays show normal humeral head and glenoid there may be subtle disruption of the glenoid humeral medial line suggesting chronic rotator cuff disease   No fracture is seen.   No evidence of avascular necrosis or arthritis   Impression normal left shoulder with possible bony signs of rotator cuff disease  Encounter Diagnoses  Name Primary?   Neck pain    Radicular pain of right upper extremity    Claustrophobia Yes   Meds ordered this encounter  Medications   diazepam  (VALIUM ) 5 MG tablet    Sig: Take 1 tablet (5 mg total) by mouth once for 1 dose. BEFORE mri APPROX 30 MIN    Dispense:  1 tablet    Refill:  0   Valium  for sedation prior to MRI Follow-up after MRI  We discussed possibility of needing epidural injections

## 2024-06-23 DIAGNOSIS — J9611 Chronic respiratory failure with hypoxia: Secondary | ICD-10-CM | POA: Diagnosis not present

## 2024-06-23 DIAGNOSIS — J449 Chronic obstructive pulmonary disease, unspecified: Secondary | ICD-10-CM | POA: Diagnosis not present

## 2024-06-27 ENCOUNTER — Other Ambulatory Visit (HOSPITAL_COMMUNITY)

## 2024-06-30 DIAGNOSIS — H353221 Exudative age-related macular degeneration, left eye, with active choroidal neovascularization: Secondary | ICD-10-CM | POA: Diagnosis not present

## 2024-07-01 ENCOUNTER — Other Ambulatory Visit: Payer: Self-pay | Admitting: Internal Medicine

## 2024-07-01 DIAGNOSIS — H26491 Other secondary cataract, right eye: Secondary | ICD-10-CM | POA: Diagnosis not present

## 2024-07-03 ENCOUNTER — Ambulatory Visit (HOSPITAL_COMMUNITY)
Admission: RE | Admit: 2024-07-03 | Discharge: 2024-07-03 | Disposition: A | Discharge status: Home / Self Care | Source: Ambulatory Visit | Attending: Orthopedic Surgery | Admitting: Orthopedic Surgery

## 2024-07-03 DIAGNOSIS — M792 Neuralgia and neuritis, unspecified: Secondary | ICD-10-CM | POA: Diagnosis not present

## 2024-07-03 DIAGNOSIS — M47812 Spondylosis without myelopathy or radiculopathy, cervical region: Secondary | ICD-10-CM

## 2024-07-03 DIAGNOSIS — M542 Cervicalgia: Secondary | ICD-10-CM | POA: Diagnosis not present

## 2024-07-14 ENCOUNTER — Ambulatory Visit (INDEPENDENT_AMBULATORY_CARE_PROVIDER_SITE_OTHER): Admitting: Orthopedic Surgery

## 2024-07-14 DIAGNOSIS — M792 Neuralgia and neuritis, unspecified: Secondary | ICD-10-CM | POA: Diagnosis not present

## 2024-07-14 DIAGNOSIS — G8929 Other chronic pain: Secondary | ICD-10-CM | POA: Diagnosis not present

## 2024-07-14 DIAGNOSIS — M542 Cervicalgia: Secondary | ICD-10-CM | POA: Diagnosis not present

## 2024-07-14 DIAGNOSIS — M25511 Pain in right shoulder: Secondary | ICD-10-CM

## 2024-07-14 MED ORDER — METHYLPREDNISOLONE ACETATE 40 MG/ML IJ SUSP
40.0000 mg | Freq: Once | INTRAMUSCULAR | Status: AC
  Administered 2024-07-14: 40 mg via INTRA_ARTICULAR

## 2024-07-14 NOTE — Progress Notes (Signed)
 FOLLOW-UP OFFICE VISIT   Patient: [name]           Date of Birth: 12-07-1940           MRN: [id number] Visit Date: 07/14/2024 Requested by: Sheryle Carwin, MD [address] [place],  [place] [id number] PCP: Sheryle Carwin, MD    Encounter Diagnoses  Name Primary?   Neck pain Yes   Radicular pain of right upper extremity    Chronic right shoulder pain     Chief Complaint  Patient presents with   Results    MRI Neck     Neck pain has subsequently resolved for the most part  Still having some pain when she lies on her right shoulder has good forward elevation  MRI The cervical spinal cord is normal.   C2-C3: The disc is normal. There is moderate bilateral facet arthropathy. No spinal stenosis or foraminal stenosis   C3-C4: There is mild degenerative disc disease with a mild disc bulge. There are small foraminal spurs. There is bilateral facet arthropathy. No spinal stenosis. There is moderate bilateral neural foraminal stenosis   C4-C5: There is moderate degenerative disc disease with a disc bulge/vertebral endplate osteophyte causing moderate spinal stenosis. There are bilateral foraminal spurs and mild facet arthropathy. There is moderate bilateral neural foraminal stenosis.   C5-C6: There is moderate degenerative disc disease. There is ankylosis of the vertebral bodies on the left side with a left foraminal spur. The facet joints are normal. There is mild left neural foraminal stenosis   C6-C7: There is severe degenerative disc disease. The facet joints are normal. No spinal stenosis or significant foraminal stenosis   C7-T1: Mild degenerative disc disease.  No significant facet disease   IMPRESSION: Cervical spondylosis.   There is moderate spinal stenosis at C4-5 due to a disc bulge/vertebral endplate osteophyte complex.   There is no severe spinal stenosis.   No disc herniation.     Electronically Signed   By: Nancyann Burns M.D.   On:  07/09/2024 10:30     ASSESSMENT AND PLAN Acute cervical radicular pain has resolved  Patient with chronic right shoulder pain from probable cuff disease  She is okay with getting an injection right shoulder subacromial space Return as needed   Procedure note the subacromial injection shoulder RIGHT    Verbal consent was obtained to inject the  RIGHT   Shoulder  Timeout was completed to confirm the injection site is a subacromial space of the  RIGHT  shoulder   Medication used Depo-Medrol  40 mg and lidocaine  1% 3 cc  Anesthesia was provided by ethyl chloride  The injection was performed in the RIGHT  posterior subacromial space. After pinning the skin with alcohol and anesthetized the skin with ethyl chloride the subacromial space was injected using a 20-gauge needle. There were no complications  Sterile dressing was applied.

## 2024-07-14 NOTE — Progress Notes (Signed)
 Right shoulder pain follow-up neck MRI

## 2024-07-21 ENCOUNTER — Inpatient Hospital Stay: Attending: Hematology

## 2024-07-21 DIAGNOSIS — Z79899 Other long term (current) drug therapy: Secondary | ICD-10-CM | POA: Diagnosis not present

## 2024-07-21 DIAGNOSIS — Z79811 Long term (current) use of aromatase inhibitors: Secondary | ICD-10-CM | POA: Diagnosis not present

## 2024-07-21 DIAGNOSIS — Z170 Estrogen receptor positive status [ER+]: Secondary | ICD-10-CM | POA: Diagnosis not present

## 2024-07-21 DIAGNOSIS — Z1732 Human epidermal growth factor receptor 2 negative status: Secondary | ICD-10-CM | POA: Insufficient documentation

## 2024-07-21 DIAGNOSIS — M810 Age-related osteoporosis without current pathological fracture: Secondary | ICD-10-CM | POA: Insufficient documentation

## 2024-07-21 DIAGNOSIS — Z1722 Progesterone receptor negative status: Secondary | ICD-10-CM | POA: Diagnosis not present

## 2024-07-21 DIAGNOSIS — Z7982 Long term (current) use of aspirin: Secondary | ICD-10-CM | POA: Insufficient documentation

## 2024-07-21 DIAGNOSIS — C50412 Malignant neoplasm of upper-outer quadrant of left female breast: Secondary | ICD-10-CM | POA: Diagnosis not present

## 2024-07-21 DIAGNOSIS — D45 Polycythemia vera: Secondary | ICD-10-CM | POA: Insufficient documentation

## 2024-07-21 DIAGNOSIS — D473 Essential (hemorrhagic) thrombocythemia: Secondary | ICD-10-CM

## 2024-07-21 DIAGNOSIS — Z7984 Long term (current) use of oral hypoglycemic drugs: Secondary | ICD-10-CM | POA: Insufficient documentation

## 2024-07-21 DIAGNOSIS — E559 Vitamin D deficiency, unspecified: Secondary | ICD-10-CM

## 2024-07-21 LAB — CBC WITH DIFFERENTIAL/PLATELET
Abs Immature Granulocytes: 0 K/uL (ref 0.00–0.07)
Basophils Absolute: 0 K/uL (ref 0.0–0.1)
Basophils Relative: 1 %
Eosinophils Absolute: 0.1 K/uL (ref 0.0–0.5)
Eosinophils Relative: 1 %
HCT: 40 % (ref 36.0–46.0)
Hemoglobin: 13.6 g/dL (ref 12.0–15.0)
Immature Granulocytes: 0 %
Lymphocytes Relative: 28 %
Lymphs Abs: 1.5 K/uL (ref 0.7–4.0)
MCH: 42 pg — ABNORMAL HIGH (ref 26.0–34.0)
MCHC: 34 g/dL (ref 30.0–36.0)
MCV: 123.5 fL — ABNORMAL HIGH (ref 80.0–100.0)
Monocytes Absolute: 0.6 K/uL (ref 0.1–1.0)
Monocytes Relative: 10 %
Neutro Abs: 3.3 K/uL (ref 1.7–7.7)
Neutrophils Relative %: 60 %
Platelets: 186 K/uL (ref 150–400)
RBC: 3.24 MIL/uL — ABNORMAL LOW (ref 3.87–5.11)
RDW: 13.1 % (ref 11.5–15.5)
Smear Review: NORMAL
WBC: 5.5 K/uL (ref 4.0–10.5)
nRBC: 0 % (ref 0.0–0.2)

## 2024-07-21 LAB — COMPREHENSIVE METABOLIC PANEL WITH GFR
ALT: 13 U/L (ref 0–44)
AST: 16 U/L (ref 15–41)
Albumin: 3.7 g/dL (ref 3.5–5.0)
Alkaline Phosphatase: 87 U/L (ref 38–126)
Anion gap: 10 (ref 5–15)
BUN: 15 mg/dL (ref 8–23)
CO2: 28 mmol/L (ref 22–32)
Calcium: 9.5 mg/dL (ref 8.9–10.3)
Chloride: 100 mmol/L (ref 98–111)
Creatinine, Ser: 0.66 mg/dL (ref 0.44–1.00)
GFR, Estimated: >60 mL/min (ref >60)
Glucose, Bld: 95 mg/dL (ref 70–99)
Potassium: 4.5 mmol/L (ref 3.5–5.1)
Sodium: 138 mmol/L (ref 135–145)
Total Bilirubin: 1.2 mg/dL (ref 0.0–1.2)
Total Protein: 7.1 g/dL (ref 6.5–8.1)

## 2024-07-21 LAB — VITAMIN D 25 HYDROXY (VIT D DEFICIENCY, FRACTURES): Vit D, 25-Hydroxy: 77.15 ng/mL (ref 30–100)

## 2024-07-21 LAB — LACTATE DEHYDROGENASE: LDH: 162 U/L (ref 98–192)

## 2024-07-24 DIAGNOSIS — J449 Chronic obstructive pulmonary disease, unspecified: Secondary | ICD-10-CM | POA: Diagnosis not present

## 2024-07-24 DIAGNOSIS — J9611 Chronic respiratory failure with hypoxia: Secondary | ICD-10-CM | POA: Diagnosis not present

## 2024-07-26 NOTE — Progress Notes (Unsigned)
 [facility] [address], [place] [address]   CLINIC:  Medical Oncology/Hematology  PCP:  Sheryle Carwin, MD [address] / [place] [address] [phone]   REASON FOR VISIT:  - History of left-sided breast cancer (September 2018) - JAK2 + polycythemia vera/essential thrombocytosis   PRIOR THERAPY: Left-sided lumpectomy with adjuvant XRT   CURRENT THERAPY: - Letrozole  daily since January 2019 - Hydrea  (current dose 1000 mg MWF and 1500 mg TTSS)  BRIEF ONCOLOGIC HISTORY:   Oncology History  Malignant neoplasm of upper-outer quadrant of left breast in female, estrogen receptor positive (HCC)  07/10/2017 Surgery   Left lumpectomy: IDC grade 2, 1.3 cm, intermediate grade DCIS, margins negative, 1/2 lymph nodes positive, ER 5% positive weak staining, PR 0%, HER-2 negative ratio 1.73, Ki-67 15%, T1cN0 stage IA AJCC 8   07/10/2017 Miscellaneous   Mammaprint: Low risk, 10-year risk of recurrence untreated 10%   10/01/2017 - 11/14/2017 Radiation Therapy   Adj XRT   11/2017 -  Anti-estrogen oral therapy   Letrozole  daily     CANCER STAGING:  Cancer Staging  Malignant neoplasm of upper-outer quadrant of left breast in female, estrogen receptor positive (HCC) Staging form: Breast, AJCC 8th Edition - Clinical stage from 05/22/2017: Stage IA (cT1, cN0, cM0, G1, ER+, PR-, HER2-) - Signed by Letha Truman ORN, NP on 06/22/2017 - Pathologic: Stage IIA (pT1c, pN1a, cM0, G2, ER+, PR-, HER2-) - Unsigned   INTERVAL HISTORY:   [name], a 83 y.o. female, returns for routine follow-up of her JAK2 PV/ET and history of left-sided breast cancer. [name] was last seen by Pleasant Barefoot PA-C on 04/22/2024.  She denies any interval changes in health since last visit.   At today's visit, she reports feeling fairly well apart from some vertigo. She reports 40% energy and 70% appetite.  She is maintaining stable weight at this time.  JAK2 POLYCYTHEMIA  VERA: She remains on Hydrea  1000 mg MWF and 1500 mg TTSS, tolerating this well.  She denies any skin sores, mouth sores, or GI side effects. She has rare paresthesias in her fingertips and legs when she stays in the same position for too long. She has intermittent vertigo and  rare headaches. She denies any vasomotor symptoms (erythromelalgia, Raynaud's, or aquagenic pruritus) or thrombotic events.  She has progressive macular degeneration causing blurry vision. No B symptoms.   She has leg swelling that is equal bilaterally, chronic and stable.  BREAST CANCER: She denies any breast lumps or lymphadenopathy. She is tolerating letrozole  fairly well with some rare hot flashes and leg cramps that have improved from prior. She is taking vitamin D  50,000 units every other week.  ASSESSMENT & PLAN:  1.  JAK2+ polycythemia vera - JAK2 [id number] mutation detected in 2015 - No prior history of thrombosis - Hospitalized 08/06/2023 through 08/07/2023 for dizziness and concern for acute CVA.  MRI did show tiny punctate right parietal stroke thought to be incidental finding by neurologist. - Hydroxyurea  was started in November 2019 - Current dose of Hydrea  is 1000 mg daily MWF and 1500 mg TTSS (mild neuropathy on current dose - unable to tolerate 1500 mg daily due to increased fatigue and peripheral neuropathy) - No vasomotor symptoms, erythromelalgia, or aquagenic pruritus  - She is taking aspirin  81 mg daily. - Labs (07/21/2024): Platelets 186, hematocrit 40.0/hemoglobin 13.6, WBC 5.5/normal differential.  CMP and LDH normal. -Therapeutic goal is platelets <400 and HCT <45.0%. - PLAN: Blood counts currently  at goal. - Continue Hydrea  1000 mg MWF and 1500 mg TTSS.  - Labs and follow-up 3 months  - Continue aspirin  81 mg daily - Patient is aware of alarm symptoms that would prompt more immediate medical attention.   2.  T1CN1A (stage IIa) malignant neoplasm of upper-outer quadrant of left breast - Left  breast biopsy on 05/29/2017 of the upper outer quadrant mass - IDC, grade 1/2.  ER 5%, PR 0%, Ki-67 15%, HER-2 negative. - Left breast lumpectomy on 07/10/2017, IDC, grade 2, 1.3 cm, margins negative, 1/2 lymph nodes positive, pT1c, PN 1A - MammaPrint-low risk luminal A, average 10-year risk of recurrence untreated 10%. - Adjuvant XRT from 10/01/2017-11/15/2017. - Letrozole  1 mg daily started on 11/15/2017. - BCI testing (October 2023) showed that patient would likely benefit from extended endocrine therapy.  (8.5% risk of late distant recurrence after 5 years of adjuvant endocrine therapy, compared to 2.8% - 3.6% risk of late distant recurrence after 10 total years of adjuvant endocrine therapy) - She is tolerating LETROZOLE  with occasional hot flashes and leg cramps, which are improved  - Reviewed mammogram from 06/02/2024: BI-RADS Category 1, negative.  No mammographic evidence of malignancy in either breast. - Annual breast exam (07/28/2024) showed no suspicious mass.  She did have some mild tenderness and palpation of freely mobile cystic structure in left medial breast tissue  - ROS did not reveal any red flag symptoms of recurrence    - Most recent labs show baseline CBC and normal CMP - PLAN: Continue letrozole .  (If she continues to have significant leg cramps, would consider switching to alternative aromatase inhibitor.)  Goal of treatment is 10 years (through January 2029) based on BCI results. - Next mammogram due July 2026.   - Breast exam annually and as needed, next due around September 2026   3.  Osteopenia with aromatase inhibitor induced bone loss - Bone density scan (05/24/2021): T score -1.0, normal - Bone density scan (05/30/2023): T-score -1.3, osteopenic - FRAX as calculated by radiologist at time of DEXA scan showed 11.3% probability of major osteoporotic fracture within the next 10 years and 2.6% probability of hip fracture within the next 10 years. Since FRAX does not take into  account risk of fracture from AI therapy, some experts adjust for this by counting patient as RA + when calculating FRAX.  With this adjustment, patient has 15% risk of major osteoporotic fracture and 3.7% risk of hip fracture within the next 10 years. - Pharmacologic treatment of osteopenia/osteoporosis is generally recommended in the following scenarios: T score </= 2.5, with history of fragility factor. T score between -1.0 and -2.5 who have risks for fracture other than AI therapy. T score between -1.0 and 2.5 to have 10-year probability of hip fracture > 3% or 10-year risk of major osteoporotic fracture > 20% based on FRAX. - Patient would potentially benefit from pharmacological treatment of osteopenia (with low-dose alendronate 35 mg weekly) due to meeting criteria of 10-year probability of hip fracture >3%, when adjusted for aromatase inhibitor use  - Optimal duration of therapy not established, but would consider discontinuing after 5 years if BMD is stable and if her short-term fracture risk remains low.  If she has presence of fragility fracture or high risk during that timeframe, would consider extending therapy up to 10 years or switching to alternative therapy. - Discussed with patient that bisphosphonate therapy is generally well-tolerated, but potential complications include transient flulike symptoms, renal insufficiency, hypocalcemia, atypical femur fractures,  and osteonecrosis of the jaw. - Most recent labs (07/21/2024): Normal calcium  9.5, normal vitamin D77.15. - She is taking vitamin D  50,000 units every other week.   - We discussed the importance of maintaining adequate calcium  and vitamin D  and continuing with weightbearing exercises to improve bone mass. - PLAN: Continue vitamin D  50,000,but DECREASE to monthly - We discussed her mild osteopenia and potential benefit from low-dose bisphosphonate.  Since osteopenia is mild, she prefers holding off on pharmacologic treatment at  this time. - In the setting of aromatase inhibitor use, we will continue to check bone density scan every 2 to 3 years, next due July 2026.  Will check vitamin D  in 6 months.   4.  Social/family history: - She was recently widowed in February 2022.  She is living independently.  She works part-time for 6 hours a day, office job.  She is non-smoker. - Father had lung cancer.  PLAN SUMMARY:  >> Labs in 3 months = CBC/D, CMP, LDH >> OFFICE visit in 3 months (1 week after labs)  NEXT DUE: - Check vitamin D  in 6 months (March 2026) - Mammogram in July 2026 - Bone density/DEXA scan July 2026 - Breast exam in September 2026    REVIEW OF SYSTEMS:  Review of Systems  Constitutional:  Positive for fatigue. Negative for appetite change, chills, diaphoresis, fever and unexpected weight change.  HENT:   Negative for lump/mass and nosebleeds.   Eyes:  Negative for eye problems.  Respiratory:  Negative for cough, hemoptysis and shortness of breath (with exertion, baseline).   Cardiovascular:  Negative for chest pain, leg swelling and palpitations.  Gastrointestinal:  Negative for abdominal pain, blood in stool, constipation, diarrhea, nausea and vomiting.  Genitourinary:  Negative for hematuria.   Skin: Negative.   Neurological:  Positive for dizziness (vertigo). Negative for headaches, light-headedness and numbness.  Hematological:  Does not bruise/bleed easily.  Psychiatric/Behavioral:  Positive for sleep disturbance (difficulty staying asleep). Negative for depression. The patient is not nervous/anxious.    PHYSICAL EXAM:  Performance status (ECOG): 3 - Symptomatic, >50% confined to bed  Wt Readings from Last 3 Encounters:  07/28/24 228 lb 13.4 oz (103.8 kg)  06/19/24 228 lb (103.4 kg)  06/08/24 228 lb (103.4 kg)   Vitals:   07/28/24 1400  BP: 132/75  Pulse: 63  Resp: 20  Temp: (!) 96.6 F (35.9 C)  TempSrc: Tympanic  SpO2: 90%  Weight: 228 lb 13.4 oz (103.8 kg)     Physical  Exam Constitutional:      Appearance: Normal appearance. She is obese.  Cardiovascular:     Heart sounds: Murmur (Left sternal border, grade 4/6, rumbling systolic murmur) heard.  Pulmonary:     Breath sounds: Normal breath sounds.  Chest:  Breasts:    Right: Normal.     Comments: No suspicious mass in either breast.  No axillary lymphadenopathy.  She did have some mild tenderness and palpation of freely mobile cystic structure in left medial breast tissue Musculoskeletal:     Right lower leg: Edema present.     Left lower leg: Edema present.  Neurological:     General: No focal deficit present.     Mental Status: Mental status is at baseline.  Psychiatric:        Behavior: Behavior normal. Behavior is cooperative.      PAST MEDICAL/SURGICAL HISTORY:  Past Medical History:  Diagnosis Date   Arthritis    Chronic diastolic heart failure (HCC)  Essential hypertension    GERD (gastroesophageal reflux disease)    Hiatal hernia    History of endometrial cancer    Sp hysterectomy 2012   Ischemic colitis (HCC) 2016   Macular degeneration of both eyes    wet   Myeloproliferative disorder, JAK-2 positive    Obesity    Pneumonia    Polycythemia vera(238.4)    TIA (transient ischemic attack)    Past Surgical History:  Procedure Laterality Date   ABDOMINAL HYSTERECTOMY  09/12/2011   Procedure: HYSTERECTOMY ABDOMINAL;  Surgeon: Sari Bachelor, MD;  Location: [facility];  Service: Gynecology;  Laterality: N/A;  Total Abdominal Hysterectomy, Bilateral Salpingo Oophorectomy   BREAST LUMPECTOMY Left 07/10/2017   LEFT BREAST LUMPECTOMY WITH RADIOACTIVE SEED AND SENTINEL LYMPH NODE BIOPSY    BREAST LUMPECTOMY WITH RADIOACTIVE SEED AND SENTINEL LYMPH NODE BIOPSY Left 07/10/2017   Procedure: LEFT BREAST LUMPECTOMY WITH RADIOACTIVE SEED AND SENTINEL LYMPH NODE BIOPSY;  Surgeon: Ebbie Cough, MD;  Location: [facility] OR;  Service: General;  Laterality: Left;   COLONOSCOPY  07/05/2012    Procedure: COLONOSCOPY;  Surgeon: Claudis RAYMOND Rivet, MD;  Location: [facility];  Service: Endoscopy;  Laterality: N/A;  730   EYE SURGERY     bilateral 8 - 12 yrs ago-cataract   FLEXIBLE SIGMOIDOSCOPY N/A 04/03/2015   Procedure: FLEXIBLE SIGMOIDOSCOPY;  Surgeon: Claudis RAYMOND Rivet, MD;  Location: [facility];  Service: Endoscopy;  Laterality: N/A;   HYSTEROSCOPY WITH D & C  08/15/2011   Procedure: DILATATION AND CURETTAGE (D&C) /HYSTEROSCOPY;  Surgeon: Norleen LULLA Server, MD;  Location: [facility];  Service: Gynecology;  Laterality: N/A;  With Suction Curette   JOINT REPLACEMENT     left knee replaced 2014   KNEE ARTHROPLASTY Left 03/15/2015   Procedure: COMPUTER ASSISTED TOTAL KNEE ARTHROPLASTY;  Surgeon: Oneil JAYSON Herald, MD;  Location: [facility] OR;  Service: Orthopedics;  Laterality: Left;   LAPAROSCOPIC LYSIS OF ADHESIONS  01/04/2024   Procedure: LAPAROSCOPIC LYSIS OF ADHESIONS;  Surgeon: Evonnie Dorothyann LABOR, DO;  Location: [facility];  Service: General;;   REPAIR, HERNIA, VENTRAL, ROBOT ASSISTED, LAPAROSCOPIC N/A 01/04/2024   Procedure: Repair, hernia, ventral, robot assisted, laparoscopic, D5;  Surgeon: Evonnie Dorothyann LABOR, DO;  Location: [facility];  Service: General;  Laterality: N/A;   SALPINGOOPHORECTOMY  09/12/2011   Procedure: SALPINGO OOPHERECTOMY;  Surgeon: Sari Bachelor, MD;  Location: [facility];  Service: Gynecology;  Laterality: Bilateral;   XI ROBOTIC ASSISTED VENTRAL HERNIA Left 01/04/2024   Procedure: XI ROBOTIC ASSISTED LEFT INGUINAL HERNIA REPAIR WITH MESH;  Surgeon: Evonnie Dorothyann LABOR, DO;  Location: [facility];  Service: General;  Laterality: Left;    SOCIAL HISTORY:  Social History   Socioeconomic History   Marital status: Widowed    Spouse name: Not on file   Number of children: 2   Years of education: 12   Highest education level: High school graduate  Occupational History   Not on file  Tobacco Use   Smoking status: Never    Passive exposure: Never   Smokeless tobacco: Never   Vaping Use   Vaping status: Never Used  Substance and Sexual Activity   Alcohol use: No   Drug use: No   Sexual activity: Not Currently    Partners: Male    Birth control/protection: Surgical    Comment: hysterectomy  Other Topics Concern   Not on file  Social History Narrative   Not on file   Social Drivers of Health   Financial Resource Strain:  Low Risk  (09/11/2022)   Overall Financial Resource Strain (CARDIA)    Difficulty of Paying Living Expenses: Not hard at all  Food Insecurity: No Food Insecurity (12/28/2023)   Hunger Vital Sign    Worried About Running Out of Food in the Last Year: Never true    Ran Out of Food in the Last Year: Never true  Transportation Needs: No Transportation Needs (12/28/2023)   PRAPARE - Administrator, Civil Service (Medical): No    Lack of Transportation (Non-Medical): No  Physical Activity: Sufficiently Active (09/11/2022)   Exercise Vital Sign    Days of Exercise per Week: 5 days    Minutes of Exercise per Session: 30 min  Stress: No Stress Concern Present (09/11/2022)   [employer] of Occupational Health - Occupational Stress Questionnaire    Feeling of Stress : Only a little  Social Connections: Moderately Integrated (12/28/2023)   Social Connection and Isolation Panel    Frequency of Communication with Friends and Family: More than three times a week    Frequency of Social Gatherings with Friends and Family: More than three times a week    Attends Religious Services: More than 4 times per year    Active Member of [employer] or Organizations: Yes    Attends Banker Meetings: More than 4 times per year    Marital Status: Widowed  Intimate Partner Violence: Not At Risk (12/28/2023)   Humiliation, Afraid, Rape, and Kick questionnaire    Fear of Current or Ex-Partner: No    Emotionally Abused: No    Physically Abused: No    Sexually Abused: No    FAMILY HISTORY:  Family History  Problem Relation Age of Onset    Hypertension Mother    Lung cancer Father    Arthritis/Rheumatoid Sister    Anesthesia problems Neg Hx    Hypotension Neg Hx    Malignant hyperthermia Neg Hx    Pseudochol deficiency Neg Hx     CURRENT MEDICATIONS:  Current Outpatient Medications  Medication Sig Dispense Refill   aspirin  EC 81 MG tablet Take 1 tablet (81 mg total) by mouth at bedtime.     atorvastatin  (LIPITOR) 40 MG tablet Take 1 tablet (40 mg total) by mouth daily. 30 tablet 2   bisacodyl  (DULCOLAX) 10 MG suppository Place 1 suppository (10 mg total) rectally daily as needed for moderate constipation or severe constipation. 12 suppository 0   carvedilol  (COREG ) 12.5 MG tablet Take 0.5 tablets (6.25 mg total) by mouth 2 (two) times daily with a meal. Take 1/2 tablet by 2 times daily     cloNIDine  (CATAPRES ) 0.3 MG tablet Take 0.15 mg by mouth 2 (two) times daily.     Cyanocobalamin  (VITAMIN B 12 PO) Take by mouth.     diazepam  (VALIUM ) 5 MG tablet Take 5 mg by mouth once.     diltiazem  (CARDIZEM  CD) 180 MG 24 hr capsule Take 180 mg by mouth daily.      empagliflozin  (JARDIANCE ) 10 MG TABS tablet TAKE 1 TABLET BY MOUTH ONCE DAILY BEFORE BREAKFAST 30 tablet 10   esomeprazole  (NEXIUM ) 40 MG capsule Take 1 capsule (40 mg total) by mouth every morning. 30 capsule 11   famotidine  (PEPCID ) 20 MG tablet TAKE 1 TABLET BY MOUTH ONCE DAILY AFTER SUPPER 30 tablet 1   furosemide  (LASIX ) 20 MG tablet Take 20 mg by mouth daily. Take with 40 mg to get 60 mg dose daily     furosemide  (LASIX )  40 MG tablet Take 1 tablet (40 mg total) by mouth daily.     hydroxyurea  (HYDREA ) 500 MG capsule Take 2 capsules (1,000 mg total) by mouth every Monday, Wednesday, and Friday AND 3 capsules (1,500 mg total) every Tuesday, Thursday, Saturday, and Sunday. You can split to 1500 mg dose into 2 tablets in the AM and 1 tablet PM. 80 capsule 5   isosorbide  mononitrate (IMDUR ) 30 MG 24 hr tablet TAKE 1/2 (ONE-HALF) TABLET BY MOUTH AT BEDTIME 45 tablet 3    letrozole  (FEMARA ) 2.5 MG tablet Take 1 tablet by mouth once daily 90 tablet 3   losartan  (COZAAR ) 100 MG tablet Take 100 mg by mouth every evening.      methylPREDNISolone  (MEDROL  DOSEPAK) 4 MG TBPK tablet Take as directed on package 21 tablet 0   Multiple Vitamins-Minerals (PRESERVISION AREDS PO) Take 1 capsule by mouth 2 (two) times daily at 10 AM and 5 PM.     polyethylene glycol (MIRALAX  / GLYCOLAX ) 17 g packet Take 17 g by mouth daily. 14 each 0   senna-docusate (SENOKOT-S) 8.6-50 MG tablet Take 1 tablet by mouth 2 (two) times daily. 60 tablet 0   Simethicone  (GAS RELIEF PO) Take 1 tablet by mouth daily.     spironolactone  (ALDACTONE ) 25 MG tablet Take 12.5 mg by mouth daily.     Vitamin D , Ergocalciferol , (DRISDOL ) 1.25 MG (50000 UNIT) CAPS capsule Take 1 capsule (50,000 Units total) by mouth every 30 (thirty) days. 3 capsule 3   No current facility-administered medications for this visit.    ALLERGIES:  Allergies  Allergen Reactions   Sulfa Antibiotics     Rash   Tegretol [Carbamazepine] Hives and Other (See Comments)    headache    LABORATORY DATA:  I have reviewed the labs as listed.     Latest Ref Rng & Units 07/21/2024    1:17 PM 04/22/2024   11:37 AM 12/29/2023    3:12 AM  CBC  WBC 4.0 - 10.5 K/uL 5.5  5.0  7.5   Hemoglobin 12.0 - 15.0 g/dL 86.3  87.2  86.3   Hematocrit 36.0 - 46.0 % 40.0  37.9  39.6   Platelets 150 - 400 K/uL 186  164  188       Latest Ref Rng & Units 07/21/2024    1:17 PM 04/22/2024   11:37 AM 12/29/2023    3:12 AM  CMP  Glucose 70 - 99 mg/dL 95  97  90   BUN 8 - 23 mg/dL 15  15  12    Creatinine 0.44 - 1.00 mg/dL 9.33  9.40  9.55   Sodium 135 - 145 mmol/L 138  137  138   Potassium 3.5 - 5.1 mmol/L 4.5  4.4  3.8   Chloride 98 - 111 mmol/L 100  102  105   CO2 22 - 32 mmol/L 28  27  26    Calcium  8.9 - 10.3 mg/dL 9.5  9.2  8.9   Total Protein 6.5 - 8.1 g/dL 7.1  6.6  6.1   Total Bilirubin 0.0 - 1.2 mg/dL 1.2  1.0  1.2   Alkaline Phos 38 - 126  U/L 87  91  73   AST 15 - 41 U/L 16  16  15    ALT 0 - 44 U/L 13  12  13      DIAGNOSTIC IMAGING:  I have independently reviewed the scans and discussed with the patient. MR Cervical Spine Wo Contrast Result Date: 07/09/2024  EXAM: MRI CERVICAL SPINE WITHOUT CONTRAST TECHNIQUE: Multiplanar, multisequence MR imaging of the cervical spine was performed. No intravenous contrast was administered. COMPARISON:  None Available. FINDINGS: The craniocervical junction is normal. There is no significant bone marrow signal abnormality. The cervical spinal cord is normal. C2-C3: The disc is normal. There is moderate bilateral facet arthropathy. No spinal stenosis or foraminal stenosis C3-C4: There is mild degenerative disc disease with a mild disc bulge. There are small foraminal spurs. There is bilateral facet arthropathy. No spinal stenosis. There is moderate bilateral neural foraminal stenosis C4-C5: There is moderate degenerative disc disease with a disc bulge/vertebral endplate osteophyte causing moderate spinal stenosis. There are bilateral foraminal spurs and mild facet arthropathy. There is moderate bilateral neural foraminal stenosis. C5-C6: There is moderate degenerative disc disease. There is ankylosis of the vertebral bodies on the left side with a left foraminal spur. The facet joints are normal. There is mild left neural foraminal stenosis C6-C7: There is severe degenerative disc disease. The facet joints are normal. No spinal stenosis or significant foraminal stenosis C7-T1: Mild degenerative disc disease.  No significant facet disease IMPRESSION: Cervical spondylosis. There is moderate spinal stenosis at C4-5 due to a disc bulge/vertebral endplate osteophyte complex. There is no severe spinal stenosis. No disc herniation. Electronically Signed   By: Nancyann Burns M.D.   On: 07/09/2024 10:30      WRAP UP:  All questions were answered. The patient knows to call the clinic with any problems, questions or  concerns.  Medical decision making: Moderate  Time spent on visit: I spent 20 minutes counseling the patient face to face. The total time spent in the appointment was 30 minutes and more than 50% was on counseling.  Pleasant CHRISTELLA Barefoot, PA-C  07/28/24 3:02 PM

## 2024-07-27 ENCOUNTER — Other Ambulatory Visit: Payer: Self-pay | Admitting: Internal Medicine

## 2024-07-28 ENCOUNTER — Inpatient Hospital Stay (HOSPITAL_BASED_OUTPATIENT_CLINIC_OR_DEPARTMENT_OTHER): Admitting: Physician Assistant

## 2024-07-28 VITALS — BP 132/75 | HR 63 | Temp 96.6°F | Resp 20 | Wt 228.8 lb

## 2024-07-28 DIAGNOSIS — Z170 Estrogen receptor positive status [ER+]: Secondary | ICD-10-CM | POA: Diagnosis not present

## 2024-07-28 DIAGNOSIS — C50412 Malignant neoplasm of upper-outer quadrant of left female breast: Secondary | ICD-10-CM | POA: Diagnosis not present

## 2024-07-28 DIAGNOSIS — D473 Essential (hemorrhagic) thrombocythemia: Secondary | ICD-10-CM

## 2024-07-28 DIAGNOSIS — D45 Polycythemia vera: Secondary | ICD-10-CM | POA: Diagnosis not present

## 2024-07-28 MED ORDER — VITAMIN D (ERGOCALCIFEROL) 1.25 MG (50000 UNIT) PO CAPS: 50000.0000 [IU] | ORAL_CAPSULE | ORAL | 3 refills | Status: AC

## 2024-07-28 NOTE — Patient Instructions (Signed)
 [facility] at [facility] Discharge Instructions  You were seen today by Pleasant Barefoot PA-C for the following conditions.  ESSENTIAL THROMBOCYTOSIS / POLYCYTHEMIA VERA: Your blood counts look excellent!  We will continue you on the same dose of Hydrea .   Take Hydrea  1000 mg (2 tablets) every morning on Monday, Wednesday, and Friday. Take Hydrea  1000 mg (2 tablets) every morning PLUS 500 mg (1 tablet) every evening on Tuesday, Thursday, Saturday, and Sunday. We will check labs and see you for office visit again in 3 months, and see if you need additional dose adjustment. Continue aspirin  81 mg daily. If you have any new symptoms or side effects before your next visit, please call our office to let us  know!  HISTORY OF LEFT BREAST CANCER: - Continue to take letrozole  (Femara ) as prescribed for your breast cancer. - You did not have any evidence of returning breast cancer on your most recent mammogram or physical exam. - Your mammogram will be due in July 2026  BONE HEALTH: Your bone density scan shows some slight worsening of your bone density, which now classifies as OSTEOPENIA.  Your osteopenia (weakened bones) is mild, but does place you at an increased risk of fracture.  However, you do not need any medical treatment of your osteopenia at this time.  We will check repeat bone density scan every 2 years (next due in July 2026).  MEDICATIONS: - Continue hydroxyurea  (Hydrea ) as above - DECREASE vitamin D  to take this once a month.  FOLLOW-UP APPOINTMENT: Office visit in 3 months, after labs  ** Thank you for trusting me with your healthcare!  I strive to provide all of my patients with quality care at each visit.  If you receive a survey for this visit, I would be so grateful to you for taking the time to provide feedback.  Thank you in advance!  ~ Taima Rada                                        Dr. Mickiel Davonna Pleasant Barefoot, PA-C    Delon Hope,  NP   - - - - - - - - - - - - - - - - - -     Thank you for choosing  [facility] at [facility] to provide your oncology and hematology care.  To afford each patient quality time with our provider, please arrive at least 15 minutes before your scheduled appointment time.   If you have a lab appointment with the Cancer Center please come in thru the Main Entrance and check in at the main information desk.  You need to re-schedule your appointment should you arrive 10 or more minutes late.  We strive to give you quality time with our providers, and arriving late affects you and other patients whose appointments are after yours.  Also, if you no show three or more times for appointments you may be dismissed from the clinic at the providers discretion.     Again, thank you for choosing [facility].  Our hope is that these requests will decrease the amount of time that you wait before being seen by our physicians.       _____________________________________________________________  Should you have questions after your visit to [facility], please contact our office at [phone] and  follow the prompts.  Our office hours are 8:00 a.m. and 4:30 p.m. Monday - Friday.  Please note that voicemails left after 4:00 p.m. may not be returned until the following business day.  We are closed weekends and major holidays.  You do have access to a nurse 24-7, just call the main number to the clinic [phone] and do not press any options, hold on the line and a nurse will answer the phone.    For prescription refill requests, have your pharmacy contact our office and allow 72 hours.    Due to Covid, you will need to wear a mask upon entering the hospital. If you do not have a mask, a mask will be given to you at the Main Entrance upon arrival. For doctor visits, patients may have 1 support person age 22 or older with them. For treatment visits, patients can not have  anyone with them due to social distancing guidelines and our immunocompromised population.

## 2024-07-29 DIAGNOSIS — I6300 Cerebral infarction due to thrombosis of unspecified precerebral artery: Secondary | ICD-10-CM | POA: Diagnosis not present

## 2024-07-30 DIAGNOSIS — H353211 Exudative age-related macular degeneration, right eye, with active choroidal neovascularization: Secondary | ICD-10-CM | POA: Diagnosis not present

## 2024-08-05 DIAGNOSIS — I639 Cerebral infarction, unspecified: Secondary | ICD-10-CM | POA: Diagnosis not present

## 2024-08-05 DIAGNOSIS — J9611 Chronic respiratory failure with hypoxia: Secondary | ICD-10-CM | POA: Diagnosis not present

## 2024-08-05 DIAGNOSIS — I5032 Chronic diastolic (congestive) heart failure: Secondary | ICD-10-CM | POA: Diagnosis not present

## 2024-08-05 DIAGNOSIS — Z23 Encounter for immunization: Secondary | ICD-10-CM | POA: Diagnosis not present

## 2024-08-05 DIAGNOSIS — I10 Essential (primary) hypertension: Secondary | ICD-10-CM | POA: Diagnosis not present

## 2024-08-11 DIAGNOSIS — H353221 Exudative age-related macular degeneration, left eye, with active choroidal neovascularization: Secondary | ICD-10-CM | POA: Diagnosis not present

## 2024-08-16 ENCOUNTER — Other Ambulatory Visit: Payer: Self-pay | Admitting: Cardiology

## 2024-08-20 ENCOUNTER — Ambulatory Visit (INDEPENDENT_AMBULATORY_CARE_PROVIDER_SITE_OTHER): Admitting: Orthopedic Surgery

## 2024-08-20 ENCOUNTER — Encounter: Payer: Self-pay | Admitting: Orthopedic Surgery

## 2024-08-20 DIAGNOSIS — G8929 Other chronic pain: Secondary | ICD-10-CM

## 2024-08-20 DIAGNOSIS — M25312 Other instability, left shoulder: Secondary | ICD-10-CM

## 2024-08-20 DIAGNOSIS — M25512 Pain in left shoulder: Secondary | ICD-10-CM

## 2024-08-20 MED ORDER — METHYLPREDNISOLONE ACETATE 40 MG/ML IJ SUSP
40.0000 mg | Freq: Once | INTRAMUSCULAR | Status: AC
  Administered 2024-08-20: 40 mg via INTRA_ARTICULAR

## 2024-08-20 NOTE — Progress Notes (Signed)
[facility]

## 2024-08-20 NOTE — Progress Notes (Signed)
 Chief Complaint  Patient presents with   Shoulder Pain    Left/ wants injection    Encounter Diagnoses  Name Primary?   Chronic left shoulder pain Yes   Rotator cuff insufficiency of left shoulder    Procedure note the subacromial injection shoulder left   Verbal consent was obtained to inject the  Left   Shoulder  Timeout was completed to confirm the injection site is a subacromial space of the  left  shoulder  Medication used Depo-Medrol  40 mg and lidocaine  1% 3 cc  Anesthesia was provided by ethyl chloride  The injection was performed in the left  posterior subacromial space. After pinning the skin with alcohol and anesthetized the skin with ethyl chloride the subacromial space was injected using a 20-gauge needle. There were no complications  Sterile dressing was applied.

## 2024-08-23 DIAGNOSIS — J9611 Chronic respiratory failure with hypoxia: Secondary | ICD-10-CM | POA: Diagnosis not present

## 2024-08-23 DIAGNOSIS — J449 Chronic obstructive pulmonary disease, unspecified: Secondary | ICD-10-CM | POA: Diagnosis not present

## 2024-08-27 DIAGNOSIS — H3561 Retinal hemorrhage, right eye: Secondary | ICD-10-CM | POA: Diagnosis not present

## 2024-08-27 DIAGNOSIS — H353231 Exudative age-related macular degeneration, bilateral, with active choroidal neovascularization: Secondary | ICD-10-CM | POA: Diagnosis not present

## 2024-08-27 DIAGNOSIS — H43813 Vitreous degeneration, bilateral: Secondary | ICD-10-CM | POA: Diagnosis not present

## 2024-08-27 DIAGNOSIS — D3131 Benign neoplasm of right choroid: Secondary | ICD-10-CM | POA: Diagnosis not present

## 2024-08-27 DIAGNOSIS — H35363 Drusen (degenerative) of macula, bilateral: Secondary | ICD-10-CM | POA: Diagnosis not present

## 2024-09-08 DIAGNOSIS — H3561 Retinal hemorrhage, right eye: Secondary | ICD-10-CM | POA: Diagnosis not present

## 2024-09-08 DIAGNOSIS — H35051 Retinal neovascularization, unspecified, right eye: Secondary | ICD-10-CM | POA: Diagnosis not present

## 2024-09-22 ENCOUNTER — Other Ambulatory Visit: Payer: Self-pay | Admitting: *Deleted

## 2024-09-22 DIAGNOSIS — H353221 Exudative age-related macular degeneration, left eye, with active choroidal neovascularization: Secondary | ICD-10-CM | POA: Diagnosis not present

## 2024-09-22 DIAGNOSIS — D473 Essential (hemorrhagic) thrombocythemia: Secondary | ICD-10-CM

## 2024-09-22 MED ORDER — HYDROXYUREA 500 MG PO CAPS: ORAL_CAPSULE | ORAL | 5 refills | Status: DC

## 2024-09-22 NOTE — Telephone Encounter (Signed)
 Tolerating Hydrea  and is to continue therapy per OVN.

## 2024-09-23 DIAGNOSIS — J9611 Chronic respiratory failure with hypoxia: Secondary | ICD-10-CM | POA: Diagnosis not present

## 2024-09-23 DIAGNOSIS — J449 Chronic obstructive pulmonary disease, unspecified: Secondary | ICD-10-CM | POA: Diagnosis not present

## 2024-09-24 DIAGNOSIS — H353211 Exudative age-related macular degeneration, right eye, with active choroidal neovascularization: Secondary | ICD-10-CM | POA: Diagnosis not present

## 2024-10-06 ENCOUNTER — Other Ambulatory Visit: Payer: Self-pay | Admitting: Internal Medicine

## 2024-11-03 ENCOUNTER — Encounter: Payer: Self-pay | Admitting: *Deleted

## 2024-11-17 ENCOUNTER — Inpatient Hospital Stay: Attending: Hematology

## 2024-11-17 DIAGNOSIS — Z1722 Progesterone receptor negative status: Secondary | ICD-10-CM | POA: Insufficient documentation

## 2024-11-17 DIAGNOSIS — C50412 Malignant neoplasm of upper-outer quadrant of left female breast: Secondary | ICD-10-CM | POA: Diagnosis present

## 2024-11-17 DIAGNOSIS — Z7982 Long term (current) use of aspirin: Secondary | ICD-10-CM | POA: Insufficient documentation

## 2024-11-17 DIAGNOSIS — Z90721 Acquired absence of ovaries, unilateral: Secondary | ICD-10-CM | POA: Diagnosis not present

## 2024-11-17 DIAGNOSIS — H538 Other visual disturbances: Secondary | ICD-10-CM | POA: Insufficient documentation

## 2024-11-17 DIAGNOSIS — H3530 Unspecified macular degeneration: Secondary | ICD-10-CM | POA: Diagnosis not present

## 2024-11-17 DIAGNOSIS — D45 Polycythemia vera: Secondary | ICD-10-CM | POA: Diagnosis not present

## 2024-11-17 DIAGNOSIS — Z90710 Acquired absence of both cervix and uterus: Secondary | ICD-10-CM | POA: Insufficient documentation

## 2024-11-17 DIAGNOSIS — Z8673 Personal history of transient ischemic attack (TIA), and cerebral infarction without residual deficits: Secondary | ICD-10-CM | POA: Insufficient documentation

## 2024-11-17 DIAGNOSIS — Z882 Allergy status to sulfonamides status: Secondary | ICD-10-CM | POA: Diagnosis not present

## 2024-11-17 DIAGNOSIS — Z79899 Other long term (current) drug therapy: Secondary | ICD-10-CM | POA: Diagnosis not present

## 2024-11-17 DIAGNOSIS — Z79811 Long term (current) use of aromatase inhibitors: Secondary | ICD-10-CM | POA: Insufficient documentation

## 2024-11-17 DIAGNOSIS — R232 Flushing: Secondary | ICD-10-CM | POA: Insufficient documentation

## 2024-11-17 DIAGNOSIS — M810 Age-related osteoporosis without current pathological fracture: Secondary | ICD-10-CM | POA: Insufficient documentation

## 2024-11-17 DIAGNOSIS — Z8261 Family history of arthritis: Secondary | ICD-10-CM | POA: Insufficient documentation

## 2024-11-17 DIAGNOSIS — G629 Polyneuropathy, unspecified: Secondary | ICD-10-CM | POA: Insufficient documentation

## 2024-11-17 DIAGNOSIS — Z8701 Personal history of pneumonia (recurrent): Secondary | ICD-10-CM | POA: Insufficient documentation

## 2024-11-17 DIAGNOSIS — Z8542 Personal history of malignant neoplasm of other parts of uterus: Secondary | ICD-10-CM | POA: Diagnosis not present

## 2024-11-17 DIAGNOSIS — I5032 Chronic diastolic (congestive) heart failure: Secondary | ICD-10-CM | POA: Diagnosis not present

## 2024-11-17 DIAGNOSIS — Z1732 Human epidermal growth factor receptor 2 negative status: Secondary | ICD-10-CM | POA: Diagnosis not present

## 2024-11-17 DIAGNOSIS — D75839 Thrombocytosis, unspecified: Secondary | ICD-10-CM | POA: Insufficient documentation

## 2024-11-17 DIAGNOSIS — R252 Cramp and spasm: Secondary | ICD-10-CM | POA: Diagnosis not present

## 2024-11-17 DIAGNOSIS — E669 Obesity, unspecified: Secondary | ICD-10-CM | POA: Insufficient documentation

## 2024-11-17 DIAGNOSIS — D473 Essential (hemorrhagic) thrombocythemia: Secondary | ICD-10-CM

## 2024-11-17 DIAGNOSIS — Z8249 Family history of ischemic heart disease and other diseases of the circulatory system: Secondary | ICD-10-CM | POA: Insufficient documentation

## 2024-11-17 DIAGNOSIS — Z170 Estrogen receptor positive status [ER+]: Secondary | ICD-10-CM | POA: Diagnosis not present

## 2024-11-17 DIAGNOSIS — Z801 Family history of malignant neoplasm of trachea, bronchus and lung: Secondary | ICD-10-CM | POA: Insufficient documentation

## 2024-11-17 LAB — COMPREHENSIVE METABOLIC PANEL WITH GFR
ALT: 10 U/L (ref 0–44)
AST: 22 U/L (ref 15–41)
Albumin: 4.1 g/dL (ref 3.5–5.0)
Alkaline Phosphatase: 100 U/L (ref 38–126)
Anion gap: 12 (ref 5–15)
BUN: 20 mg/dL (ref 8–23)
CO2: 25 mmol/L (ref 22–32)
Calcium: 9.7 mg/dL (ref 8.9–10.3)
Chloride: 102 mmol/L (ref 98–111)
Creatinine, Ser: 0.9 mg/dL (ref 0.44–1.00)
GFR, Estimated: >60 mL/min
Glucose, Bld: 91 mg/dL (ref 70–99)
Potassium: 5 mmol/L (ref 3.5–5.1)
Sodium: 139 mmol/L (ref 135–145)
Total Bilirubin: 0.9 mg/dL (ref 0.0–1.2)
Total Protein: 7.2 g/dL (ref 6.5–8.1)

## 2024-11-17 LAB — CBC WITH DIFFERENTIAL/PLATELET
Abs Immature Granulocytes: 0.01 K/uL (ref 0.00–0.07)
Basophils Absolute: 0 K/uL (ref 0.0–0.1)
Basophils Relative: 1 %
Eosinophils Absolute: 0.1 K/uL (ref 0.0–0.5)
Eosinophils Relative: 1 %
HCT: 37.9 % (ref 36.0–46.0)
Hemoglobin: 12.8 g/dL (ref 12.0–15.0)
Immature Granulocytes: 0 %
Lymphocytes Relative: 31 %
Lymphs Abs: 1.4 K/uL (ref 0.7–4.0)
MCH: 42.8 pg — ABNORMAL HIGH (ref 26.0–34.0)
MCHC: 33.8 g/dL (ref 30.0–36.0)
MCV: 126.8 fL — ABNORMAL HIGH (ref 80.0–100.0)
Monocytes Absolute: 0.6 K/uL (ref 0.1–1.0)
Monocytes Relative: 12 %
Neutro Abs: 2.5 K/uL (ref 1.7–7.7)
Neutrophils Relative %: 55 %
Platelets: 192 K/uL (ref 150–400)
RBC: 2.99 MIL/uL — ABNORMAL LOW (ref 3.87–5.11)
RDW: 13.4 % (ref 11.5–15.5)
Smear Review: NORMAL
WBC: 4.6 K/uL (ref 4.0–10.5)
nRBC: 0 % (ref 0.0–0.2)

## 2024-11-17 LAB — LACTATE DEHYDROGENASE: LDH: 241 U/L — ABNORMAL HIGH (ref 105–235)

## 2024-11-20 NOTE — Progress Notes (Signed)
 "  [facility] [address], [place] [address]   CLINIC:  Medical Oncology/Hematology  PCP:  Sheryle Carwin, MD [address] / [place] [address] [phone]   REASON FOR VISIT:  - History of left-sided breast cancer (September 2018) - JAK2 + polycythemia vera/essential thrombocytosis   PRIOR THERAPY: Left-sided lumpectomy with adjuvant XRT   CURRENT THERAPY: - Letrozole  daily since January 2019 - Hydrea  (current dose 1000 mg MWF and 1500 mg TTSS)  BRIEF ONCOLOGIC HISTORY:   Oncology History  Malignant neoplasm of upper-outer quadrant of left breast in female, estrogen receptor positive (HCC)  07/10/2017 Surgery   Left lumpectomy: IDC grade 2, 1.3 cm, intermediate grade DCIS, margins negative, 1/2 lymph nodes positive, ER 5% positive weak staining, PR 0%, HER-2 negative ratio 1.73, Ki-67 15%, T1cN0 stage IA AJCC 8   07/10/2017 Miscellaneous   Mammaprint: Low risk, 10-year risk of recurrence untreated 10%   10/01/2017 - 11/14/2017 Radiation Therapy   Adj XRT   11/2017 -  Anti-estrogen oral therapy   Letrozole  daily     CANCER STAGING:  Cancer Staging  Malignant neoplasm of upper-outer quadrant of left breast in female, estrogen receptor positive (HCC) Staging form: Breast, AJCC 8th Edition - Clinical stage from 05/22/2017: Stage IA (cT1, cN0, cM0, G1, ER+, PR-, HER2-) - Signed by Letha Truman ORN, NP on 06/22/2017 - Pathologic: Stage IIA (pT1c, pN1a, cM0, G2, ER+, PR-, HER2-) - Unsigned   INTERVAL HISTORY:   [name], a 84 y.o. female, returns for routine follow-up of her JAK2 PV/ET and history of left-sided breast cancer.  Seraiah was last seen by Pleasant Barefoot PA-C on 07/28/2024.  Since last visit, she has had some new numbness and tingling in her bilateral lower extremities, left greater than right.  Symptoms are worse if she remains in the same position for too long.  (She has been advised to discuss this with her  established orthopedic doctor.) She denies any other interval changes in health since last visit.    At today's visit, she reports feeling fairly. She reports 75% energy and 100% appetite.   She is maintaining stable weight at this time.   JAK2 POLYCYTHEMIA VERA: She remains on Hydrea  1000 mg MWF and 1500 mg TTSS, tolerating this well.  She denies any skin sores, mouth sores, or GI side effects. She has paresthesias in her fingertips and legs when she stays in the same position for too long. She has intermittent vertigo and  rare headaches. She denies any vasomotor symptoms (erythromelalgia, Raynaud's, or aquagenic pruritus) or thrombotic events.  She has progressive macular degeneration causing blurry vision. No B symptoms.   She has leg swelling that is equal bilaterally, chronic and stable.  BREAST CANCER: She denies any breast lumps or lymphadenopathy. She is tolerating letrozole  fairly well with some rare hot flashes and leg cramps that have improved from prior. She is taking vitamin D  50,000 units once a month.  ASSESSMENT & PLAN:  1.  JAK2+ polycythemia vera - JAK2 [id number] mutation detected in 2015 - No prior history of thrombosis - Hospitalized 08/06/2023 through 08/07/2023 for dizziness and concern for acute CVA.  MRI did show tiny punctate right parietal stroke thought to be incidental finding by neurologist. - Hydroxyurea  was started in November 2019 - Current dose of Hydrea  is 1000 mg daily MWF and 1500 mg TTSS (mild neuropathy on current dose - unable to tolerate 1500 mg daily due to increased fatigue and peripheral  neuropathy) - No vasomotor symptoms, erythromelalgia, or aquagenic pruritus  - She is taking aspirin  81 mg daily. - Labs (11/17/2024): Platelets 192, hematocrit 37.9%/hemoglobin 12.8, WBC 4.6/normal differential.  CMP normal, but LDH mildly elevated 241. -Therapeutic goal is platelets <400 and HCT <45.0%. - PLAN: Blood counts currently at goal. - Since blood  counts are trending toward the lower end of normal, we will DECREASE her Hydrea  to take 1000 mg daily.  Recheck CBC/D in 6 weeks to see how she is doing on this dose. - Labs and follow-up 3 months  - Continue aspirin  81 mg daily - Patient is aware of alarm symptoms that would prompt more immediate medical attention.   2.  T1CN1A (stage IIa) malignant neoplasm of upper-outer quadrant of left breast - Left breast biopsy on 05/29/2017 of the upper outer quadrant mass - IDC, grade 1/2.  ER 5%, PR 0%, Ki-67 15%, HER-2 negative. - Left breast lumpectomy on 07/10/2017, IDC, grade 2, 1.3 cm, margins negative, 1/2 lymph nodes positive, pT1c, PN 1A - MammaPrint-low risk luminal A, average 10-year risk of recurrence untreated 10%. - Adjuvant XRT from 10/01/2017-11/15/2017. - Letrozole  1 mg daily started on 11/15/2017. - BCI testing (October 2023) showed that patient would likely benefit from extended endocrine therapy.  (8.5% risk of late distant recurrence after 5 years of adjuvant endocrine therapy, compared to 2.8% - 3.6% risk of late distant recurrence after 10 total years of adjuvant endocrine therapy) - She is tolerating LETROZOLE  with occasional hot flashes and leg cramps, which are improved  - Reviewed mammogram from 06/02/2024: BI-RADS Category 1, negative.  No mammographic evidence of malignancy in either breast. - Annual breast exam (07/28/2024) showed no suspicious mass.  She did have some mild tenderness and palpation of freely mobile cystic structure in left medial breast tissue  - ROS did not reveal any red flag symptoms of recurrence    - Most recent labs show baseline CBC and normal CMP - PLAN: Continue letrozole .  (If she continues to have significant leg cramps, would consider switching to alternative aromatase inhibitor.)  Goal of treatment is 10 years (through January 2029) based on BCI results. - Next mammogram due July 2026.   - Breast exam annually and as needed, next due around September  2026   3.  Osteopenia with aromatase inhibitor induced bone loss - Bone density scan (05/24/2021): T score -1.0, normal - Bone density scan (05/30/2023): T-score -1.3, osteopenic - FRAX as calculated by radiologist at time of DEXA scan showed 11.3% probability of major osteoporotic fracture within the next 10 years and 2.6% probability of hip fracture within the next 10 years. Since FRAX does not take into account risk of fracture from AI therapy, some experts adjust for this by counting patient as RA + when calculating FRAX.  With this adjustment, patient has 15% risk of major osteoporotic fracture and 3.7% risk of hip fracture within the next 10 years. - Pharmacologic treatment of osteopenia/osteoporosis is generally recommended in the following scenarios: T score </= 2.5, with history of fragility factor. T score between -1.0 and -2.5 who have risks for fracture other than AI therapy. T score between -1.0 and 2.5 to have 10-year probability of hip fracture > 3% or 10-year risk of major osteoporotic fracture > 20% based on FRAX. - Patient would potentially benefit from pharmacological treatment of osteopenia (with low-dose alendronate 35 mg weekly) due to meeting criteria of 10-year probability of hip fracture >3%, when adjusted for aromatase inhibitor use  -  Optimal duration of therapy not established, but would consider discontinuing after 5 years if BMD is stable and if her short-term fracture risk remains low.  If she has presence of fragility fracture or high risk during that timeframe, would consider extending therapy up to 10 years or switching to alternative therapy. - Discussed with patient that bisphosphonate therapy is generally well-tolerated, but potential complications include transient flulike symptoms, renal insufficiency, hypocalcemia, atypical femur fractures, and osteonecrosis of the jaw. - Most recent labs (07/21/2024): Normal calcium  9.5, normal vitamin D77.15. - She is taking  vitamin D  50,000 units every other week.   - We discussed the importance of maintaining adequate calcium  and vitamin D  and continuing with weightbearing exercises to improve bone mass. - PLAN: Continue vitamin D  50,000, once a month monthly - We discussed her mild osteopenia and potential benefit from low-dose bisphosphonate.  Since osteopenia is mild, she prefers holding off on pharmacologic treatment at this time. - In the setting of aromatase inhibitor use, we will continue to check bone density scan every 2 to 3 years, next due July 2026. - Will check vitamin D  6 months out from last   4.  Social/family history: - She was recently widowed in February 2022.  She is living independently.  She works part-time for 6 hours a day, office job.  She is non-smoker. - Father had lung cancer.  PLAN SUMMARY:  >> Rx to pharmacy for decreased dose of Hydrea  >> CBC/D only in 6 weeks - Will watch for results and call/message patient with any dose changes needed >> Labs in 3 months = CBC/D, CMP, LDH, vitamin D  >> OFFICE visit in 3 months (1 week after labs)  NEXT DUE: - Mammogram in July 2026 - Bone density/DEXA scan July 2026 - Breast exam in September 2026    REVIEW OF SYSTEMS:  Review of Systems  Constitutional:  Positive for fatigue. Negative for appetite change, chills, diaphoresis, fever and unexpected weight change.  HENT:   Negative for lump/mass and nosebleeds.   Eyes:  Negative for eye problems.  Respiratory:  Positive for shortness of breath (with exertion, baselinewith exertion). Negative for cough and hemoptysis.   Cardiovascular:  Positive for chest pain (at times). Negative for leg swelling and palpitations.  Gastrointestinal:  Negative for abdominal pain, blood in stool, constipation, diarrhea, nausea and vomiting.  Genitourinary:  Negative for hematuria.   Skin: Negative.   Neurological:  Positive for headaches and numbness. Negative for dizziness (vertigo) and light-headedness.   Hematological:  Does not bruise/bleed easily.  Psychiatric/Behavioral:  Negative for depression and sleep disturbance (difficulty staying asleep). The patient is not nervous/anxious.    PHYSICAL EXAM:  Performance status (ECOG): 3 - Symptomatic, >50% confined to bed  Wt Readings from Last 3 Encounters:  11/24/24 226 lb 11.2 oz (102.8 kg)  07/28/24 228 lb 13.4 oz (103.8 kg)  06/19/24 228 lb (103.4 kg)   Vitals:   11/24/24 1259  BP: 124/63  Pulse: 63  Resp: 16  Temp: 97.9 F (36.6 C)  TempSrc: Oral  SpO2: 94%  Weight: 226 lb 11.2 oz (102.8 kg)      Physical Exam Constitutional:      Appearance: Normal appearance. She is obese.  Cardiovascular:     Heart sounds: Murmur (Left sternal border, grade 4/6, rumbling systolic murmur) heard.  Pulmonary:     Breath sounds: Normal breath sounds.  Chest:  Breasts:    Right: Normal.     Comments: BREAST EXAM 07/28/2024: No suspicious  mass in either breast.  No axillary lymphadenopathy.  She did have some mild tenderness and palpation of freely mobile cystic structure in left medial breast tissue Musculoskeletal:     Right lower leg: Edema present.     Left lower leg: Edema present.  Neurological:     General: No focal deficit present.     Mental Status: Mental status is at baseline.  Psychiatric:        Behavior: Behavior normal. Behavior is cooperative.      PAST MEDICAL/SURGICAL HISTORY:  Past Medical History:  Diagnosis Date   Arthritis    Chronic diastolic heart failure (HCC)    Essential hypertension    GERD (gastroesophageal reflux disease)    Hiatal hernia    History of endometrial cancer    Sp hysterectomy 2012   Ischemic colitis 2016   Macular degeneration of both eyes    wet   Myeloproliferative disorder, JAK-2 positive    Obesity    Pneumonia    Polycythemia vera(238.4)    TIA (transient ischemic attack)    Past Surgical History:  Procedure Laterality Date   ABDOMINAL HYSTERECTOMY  09/12/2011    Procedure: HYSTERECTOMY ABDOMINAL;  Surgeon: Sari Bachelor, MD;  Location: [facility];  Service: Gynecology;  Laterality: N/A;  Total Abdominal Hysterectomy, Bilateral Salpingo Oophorectomy   BREAST LUMPECTOMY Left 07/10/2017   LEFT BREAST LUMPECTOMY WITH RADIOACTIVE SEED AND SENTINEL LYMPH NODE BIOPSY    BREAST LUMPECTOMY WITH RADIOACTIVE SEED AND SENTINEL LYMPH NODE BIOPSY Left 07/10/2017   Procedure: LEFT BREAST LUMPECTOMY WITH RADIOACTIVE SEED AND SENTINEL LYMPH NODE BIOPSY;  Surgeon: Ebbie Cough, MD;  Location: [facility] OR;  Service: General;  Laterality: Left;   COLONOSCOPY  07/05/2012   Procedure: COLONOSCOPY;  Surgeon: Claudis RAYMOND Rivet, MD;  Location: [facility];  Service: Endoscopy;  Laterality: N/A;  730   EYE SURGERY     bilateral 8 - 12 yrs ago-cataract   FLEXIBLE SIGMOIDOSCOPY N/A 04/03/2015   Procedure: FLEXIBLE SIGMOIDOSCOPY;  Surgeon: Claudis RAYMOND Rivet, MD;  Location: [facility];  Service: Endoscopy;  Laterality: N/A;   HYSTEROSCOPY WITH D & C  08/15/2011   Procedure: DILATATION AND CURETTAGE (D&C) /HYSTEROSCOPY;  Surgeon: Norleen LULLA Server, MD;  Location: [facility];  Service: Gynecology;  Laterality: N/A;  With Suction Curette   JOINT REPLACEMENT     left knee replaced 2014   KNEE ARTHROPLASTY Left 03/15/2015   Procedure: COMPUTER ASSISTED TOTAL KNEE ARTHROPLASTY;  Surgeon: Oneil JAYSON Herald, MD;  Location: [facility] OR;  Service: Orthopedics;  Laterality: Left;   LAPAROSCOPIC LYSIS OF ADHESIONS  01/04/2024   Procedure: LAPAROSCOPIC LYSIS OF ADHESIONS;  Surgeon: Evonnie Dorothyann LABOR, DO;  Location: [facility];  Service: General;;   REPAIR, HERNIA, VENTRAL, ROBOT ASSISTED, LAPAROSCOPIC N/A 01/04/2024   Procedure: Repair, hernia, ventral, robot assisted, laparoscopic, D5;  Surgeon: Evonnie Dorothyann LABOR, DO;  Location: [facility];  Service: General;  Laterality: N/A;   SALPINGOOPHORECTOMY  09/12/2011   Procedure: SALPINGO OOPHERECTOMY;  Surgeon: Sari Bachelor, MD;  Location: [facility];  Service:  Gynecology;  Laterality: Bilateral;   XI ROBOTIC ASSISTED VENTRAL HERNIA Left 01/04/2024   Procedure: XI ROBOTIC ASSISTED LEFT INGUINAL HERNIA REPAIR WITH MESH;  Surgeon: Evonnie Dorothyann LABOR, DO;  Location: [facility];  Service: General;  Laterality: Left;    SOCIAL HISTORY:  Social History   Socioeconomic History   Marital status: Widowed    Spouse name: Not on file   Number of children: 2   Years of education:  12   Highest education level: High school graduate  Occupational History   Not on file  Tobacco Use   Smoking status: Never    Passive exposure: Never   Smokeless tobacco: Never  Vaping Use   Vaping status: Never Used  Substance and Sexual Activity   Alcohol use: No   Drug use: No   Sexual activity: Not Currently    Partners: Male    Birth control/protection: Surgical    Comment: hysterectomy  Other Topics Concern   Not on file  Social History Narrative   Not on file   Social Drivers of Health   Tobacco Use: Low Risk (08/20/2024)   Patient History    Smoking Tobacco Use: Never    Smokeless Tobacco Use: Never    Passive Exposure: Never  Financial Resource Strain: Low Risk (09/11/2022)   Overall Financial Resource Strain (CARDIA)    Difficulty of Paying Living Expenses: Not hard at all  Food Insecurity: No Food Insecurity (12/28/2023)   Hunger Vital Sign    Worried About Running Out of Food in the Last Year: Never true    Ran Out of Food in the Last Year: Never true  Transportation Needs: No Transportation Needs (12/28/2023)   PRAPARE - Administrator, Civil Service (Medical): No    Lack of Transportation (Non-Medical): No  Physical Activity: Sufficiently Active (09/11/2022)   Exercise Vital Sign    Days of Exercise per Week: 5 days    Minutes of Exercise per Session: 30 min  Stress: No Stress Concern Present (09/11/2022)   [employer] of Occupational Health - Occupational Stress Questionnaire    Feeling of Stress : Only a little  Social  Connections: Moderately Integrated (12/28/2023)   Social Connection and Isolation Panel    Frequency of Communication with Friends and Family: More than three times a week    Frequency of Social Gatherings with Friends and Family: More than three times a week    Attends Religious Services: More than 4 times per year    Active Member of [employer] or Organizations: Yes    Attends Banker Meetings: More than 4 times per year    Marital Status: Widowed  Intimate Partner Violence: Not At Risk (12/28/2023)   Humiliation, Afraid, Rape, and Kick questionnaire    Fear of Current or Ex-Partner: No    Emotionally Abused: No    Physically Abused: No    Sexually Abused: No  Depression (PHQ2-9): Low Risk (11/24/2024)   Depression (PHQ2-9)    PHQ-2 Score: 0  Alcohol Screen: Low Risk (09/11/2022)   Alcohol Screen    Last Alcohol Screening Score (AUDIT): 0  Housing: Low Risk (12/28/2023)   Housing Stability Vital Sign    Unable to Pay for Housing in the Last Year: No    Number of Times Moved in the Last Year: 0    Homeless in the Last Year: No  Utilities: Not At Risk (12/28/2023)   AHC Utilities    Threatened with loss of utilities: No  Health Literacy: Not on file    FAMILY HISTORY:  Family History  Problem Relation Age of Onset   Hypertension Mother    Lung cancer Father    Arthritis/Rheumatoid Sister    Anesthesia problems Neg Hx    Hypotension Neg Hx    Malignant hyperthermia Neg Hx    Pseudochol deficiency Neg Hx     CURRENT MEDICATIONS:  Current Outpatient Medications  Medication Sig Dispense Refill  aspirin  EC 81 MG tablet Take 1 tablet (81 mg total) by mouth at bedtime.     atorvastatin  (LIPITOR) 40 MG tablet Take 1 tablet (40 mg total) by mouth daily. 30 tablet 2   bisacodyl  (DULCOLAX) 10 MG suppository Place 1 suppository (10 mg total) rectally daily as needed for moderate constipation or severe constipation. 12 suppository 0   carvedilol  (COREG ) 12.5 MG tablet Take 0.5  tablets (6.25 mg total) by mouth 2 (two) times daily with a meal. Take 1/2 tablet by 2 times daily     cloNIDine  (CATAPRES ) 0.3 MG tablet Take 0.15 mg by mouth 2 (two) times daily.     Cyanocobalamin  (VITAMIN B 12 PO) Take by mouth.     diazepam  (VALIUM ) 5 MG tablet Take 5 mg by mouth once.     diltiazem  (CARDIZEM  CD) 180 MG 24 hr capsule Take 180 mg by mouth daily.      empagliflozin  (JARDIANCE ) 10 MG TABS tablet TAKE 1 TABLET BY MOUTH ONCE DAILY BEFORE BREAKFAST 30 tablet 10   esomeprazole  (NEXIUM ) 40 MG capsule Take 1 capsule (40 mg total) by mouth every morning. 30 capsule 11   famotidine  (PEPCID ) 20 MG tablet TAKE 1 TABLET BY MOUTH ONCE DAILY AFTER SUPPER 30 tablet 6   furosemide  (LASIX ) 20 MG tablet Take 20 mg by mouth daily. Take with 40 mg to get 60 mg dose daily     furosemide  (LASIX ) 40 MG tablet Take 1 tablet (40 mg total) by mouth daily.     isosorbide  mononitrate (IMDUR ) 30 MG 24 hr tablet TAKE 1/2 (ONE-HALF) TABLET BY MOUTH AT BEDTIME 45 tablet 3   letrozole  (FEMARA ) 2.5 MG tablet Take 1 tablet by mouth once daily 90 tablet 3   losartan  (COZAAR ) 100 MG tablet Take 100 mg by mouth every evening.      methylPREDNISolone  (MEDROL  DOSEPAK) 4 MG TBPK tablet Take as directed on package 21 tablet 0   Multiple Vitamins-Minerals (PRESERVISION AREDS PO) Take 1 capsule by mouth 2 (two) times daily at 10 AM and 5 PM.     polyethylene glycol (MIRALAX  / GLYCOLAX ) 17 g packet Take 17 g by mouth daily. 14 each 0   senna-docusate (SENOKOT-S) 8.6-50 MG tablet Take 1 tablet by mouth 2 (two) times daily. 60 tablet 0   Simethicone  (GAS RELIEF PO) Take 1 tablet by mouth daily.     spironolactone  (ALDACTONE ) 25 MG tablet Take 1/2 (one-half) tablet by mouth once daily 45 tablet 2   Vitamin D , Ergocalciferol , (DRISDOL ) 1.25 MG (50000 UNIT) CAPS capsule Take 1 capsule (50,000 Units total) by mouth every 30 (thirty) days. 3 capsule 3   hydroxyurea  (HYDREA ) 500 MG capsule Take 2 capsules (1,000 mg total) by  mouth daily. You can split to 1500 mg dose into 2 tablets in the AM and 1 tablet PM. 180 capsule 1   No current facility-administered medications for this visit.    ALLERGIES:  Allergies  Allergen Reactions   Sulfa Antibiotics     Rash   Tegretol [Carbamazepine] Hives and Other (See Comments)    headache    LABORATORY DATA:  I have reviewed the labs as listed.     Latest Ref Rng & Units 11/17/2024    1:20 PM 07/21/2024    1:17 PM 04/22/2024   11:37 AM  CBC  WBC 4.0 - 10.5 K/uL 4.6  5.5  5.0   Hemoglobin 12.0 - 15.0 g/dL 87.1  86.3  87.2   Hematocrit 36.0 - 46.0 %  37.9  40.0  37.9   Platelets 150 - 400 K/uL 192  186  164       Latest Ref Rng & Units 11/17/2024    1:20 PM 07/21/2024    1:17 PM 04/22/2024   11:37 AM  CMP  Glucose 70 - 99 mg/dL 91  95  97   BUN 8 - 23 mg/dL 20  15  15    Creatinine 0.44 - 1.00 mg/dL 9.09  9.33  9.40   Sodium 135 - 145 mmol/L 139  138  137   Potassium 3.5 - 5.1 mmol/L 5.0  4.5  4.4   Chloride 98 - 111 mmol/L 102  100  102   CO2 22 - 32 mmol/L 25  28  27    Calcium  8.9 - 10.3 mg/dL 9.7  9.5  9.2   Total Protein 6.5 - 8.1 g/dL 7.2  7.1  6.6   Total Bilirubin 0.0 - 1.2 mg/dL 0.9  1.2  1.0   Alkaline Phos 38 - 126 U/L 100  87  91   AST 15 - 41 U/L 22  16  16    ALT 0 - 44 U/L 10  13  12      DIAGNOSTIC IMAGING:  I have independently reviewed the scans and discussed with the patient. No results found.     WRAP UP:  All questions were answered. The patient knows to call the clinic with any problems, questions or concerns.  Medical decision making: Moderate  Time spent on visit: I spent 20 minutes counseling the patient face to face. The total time spent in the appointment was 30 minutes and more than 50% was on counseling.  Pleasant CHRISTELLA Barefoot, PA-C  11/24/24 1:33 PM  "

## 2024-11-24 ENCOUNTER — Inpatient Hospital Stay: Admitting: Physician Assistant

## 2024-11-24 VITALS — BP 124/63 | HR 63 | Temp 97.9°F | Resp 16 | Wt 226.7 lb

## 2024-11-24 DIAGNOSIS — C50412 Malignant neoplasm of upper-outer quadrant of left female breast: Secondary | ICD-10-CM | POA: Diagnosis not present

## 2024-11-24 DIAGNOSIS — Z170 Estrogen receptor positive status [ER+]: Secondary | ICD-10-CM | POA: Diagnosis not present

## 2024-11-24 DIAGNOSIS — D473 Essential (hemorrhagic) thrombocythemia: Secondary | ICD-10-CM

## 2024-11-24 DIAGNOSIS — E559 Vitamin D deficiency, unspecified: Secondary | ICD-10-CM | POA: Diagnosis not present

## 2024-11-24 DIAGNOSIS — D45 Polycythemia vera: Secondary | ICD-10-CM

## 2024-11-24 MED ORDER — HYDROXYUREA 500 MG PO CAPS: 1000.0000 mg | ORAL_CAPSULE | Freq: Every day | ORAL | 1 refills | Status: AC

## 2024-11-24 NOTE — Patient Instructions (Signed)
 [facility] at [facility] Discharge Instructions  You were seen today by Pleasant Barefoot PA-C for the following conditions.  ESSENTIAL THROMBOCYTOSIS / POLYCYTHEMIA VERA:  Your blood counts look excellent!   We are going to DECREASE your Hydrea  to see how you do on the lower dose.  You will need to take Hydrea  1000 mg (2 tablets) daily.  You no longer need to take 3 tablets on any days of the week. We will check your blood count in 6 weeks to see if you need any further dose adjustment. We will check labs and see you for office visit again in 3 months. Continue aspirin  81 mg daily. If you have any new symptoms or side effects before your next visit, please call our office to let us  know!  HISTORY OF LEFT BREAST CANCER: - Continue to take letrozole  (Femara ) as prescribed for your breast cancer. - You did not have any evidence of returning breast cancer on your most recent mammogram or physical exam. - Your mammogram will be due in July 2026  BONE HEALTH: Your bone density scan shows some slight worsening of your bone density, which now classifies as OSTEOPENIA.  Your osteopenia (weakened bones) is mild, but does place you at an increased risk of fracture.  However, you do not need any medical treatment of your osteopenia at this time.  We will check repeat bone density scan every 2 years (next due in July 2026).  MEDICATIONS: - Continue hydroxyurea  (Hydrea ) as above - Take vitamin D  once a month.  FOLLOW-UP APPOINTMENT: Office visit in 3 months, after labs  ** Thank you for trusting me with your healthcare!  I strive to provide all of my patients with quality care at each visit.  If you receive a survey for this visit, I would be so grateful to you for taking the time to provide feedback.  Thank you in advance!  ~ Jermie Hippe                                        Dr. Mickiel Davonna Pleasant Barefoot, PA-C    Delon Hope, NP   - - - - - - - - - - - - - - -  - - -     Thank you for choosing [facility] at [facility] to provide your oncology and hematology care.  To afford each patient quality time with our provider, please arrive at least 15 minutes before your scheduled appointment time.   If you have a lab appointment with the Cancer Center please come in thru the Main Entrance and check in at the main information desk.  You need to re-schedule your appointment should you arrive 10 or more minutes late.  We strive to give you quality time with our providers, and arriving late affects you and other patients whose appointments are after yours.  Also, if you no show three or more times for appointments you may be dismissed from the clinic at the providers discretion.     Again, thank you for choosing [facility].  Our hope is that these requests will decrease the amount of time that you wait before being seen by our physicians.       _____________________________________________________________  Should you have questions after your visit to [facility], please contact  our office at [phone] and follow the prompts.  Our office hours are 8:00 a.m. and 4:30 p.m. Monday - Friday.  Please note that voicemails left after 4:00 p.m. may not be returned until the following business day.  We are closed weekends and major holidays.  You do have access to a nurse 24-7, just call the main number to the clinic [phone] and do not press any options, hold on the line and a nurse will answer the phone.    For prescription refill requests, have your pharmacy contact our office and allow 72 hours.    Due to Covid, you will need to wear a mask upon entering the hospital. If you do not have a mask, a mask will be given to you at the Main Entrance upon arrival. For doctor visits, patients may have 1 support person age 61 or older with them. For treatment visits, patients can not have anyone with them due to social  distancing guidelines and our immunocompromised population.

## 2024-12-09 ENCOUNTER — Other Ambulatory Visit: Payer: Self-pay | Admitting: Physician Assistant

## 2024-12-09 ENCOUNTER — Ambulatory Visit: Admitting: Internal Medicine

## 2024-12-09 DIAGNOSIS — C50412 Malignant neoplasm of upper-outer quadrant of left female breast: Secondary | ICD-10-CM

## 2024-12-10 ENCOUNTER — Ambulatory Visit: Admitting: Cardiology

## 2024-12-10 ENCOUNTER — Encounter: Payer: Self-pay | Admitting: Cardiology

## 2024-12-10 VITALS — BP 104/60 | HR 67 | Ht 64.0 in | Wt 223.0 lb

## 2024-12-10 DIAGNOSIS — I890 Lymphedema, not elsewhere classified: Secondary | ICD-10-CM

## 2024-12-10 DIAGNOSIS — I350 Nonrheumatic aortic (valve) stenosis: Secondary | ICD-10-CM | POA: Diagnosis not present

## 2024-12-10 DIAGNOSIS — I259 Chronic ischemic heart disease, unspecified: Secondary | ICD-10-CM | POA: Diagnosis not present

## 2024-12-10 DIAGNOSIS — I5032 Chronic diastolic (congestive) heart failure: Secondary | ICD-10-CM

## 2024-12-10 DIAGNOSIS — I272 Pulmonary hypertension, unspecified: Secondary | ICD-10-CM

## 2024-12-10 NOTE — Patient Instructions (Signed)
Medication Instructions:  Your physician recommends that you continue on your current medications as directed. Please refer to the Current Medication list given to you today.   Labwork: None today  Testing/Procedures: Echo in October  Follow-Up:  October with Dr.McDowell  Any Other Special Instructions Will Be Listed Below (If Applicable).  If you need a refill on your cardiac medications before your next appointment, please call your pharmacy.

## 2024-12-10 NOTE — Progress Notes (Signed)
"  ° ° °  Cardiology Office Note  Date: 12/10/2024   ID: Lori, Stafford May 04, 1941, MRN [id number]  History of Present Illness: [name] is an 84 y.o. female last seen in July 2025.  She is here for a routine visit.  Still working in the mornings for [employer] during the week.  No new exertional symptoms, at this point NYHA class II dyspnea and reasonable control of lymphedema.  She is using a rollator.  No recent falls.  We went over her medications.  No changes to cardiac regimen.  I did go over her interval lab work which is noted below.  We discussed getting an updated echocardiogram prior to her next visit.  She continues to follow with Dr. Sheryle.  Physical Exam: VS:  BP 104/60 (BP Location: Left Arm, Patient Position: Sitting, Cuff Size: Large)   Pulse 67   Ht 5' 4 (1.626 m)   Wt 223 lb (101.2 kg)   SpO2 96%   BMI 38.28 kg/m , BMI Body mass index is 38.28 kg/m.  Wt Readings from Last 3 Encounters:  12/10/24 223 lb (101.2 kg)  11/24/24 226 lb 11.2 oz (102.8 kg)  07/28/24 228 lb 13.4 oz (103.8 kg)    General: Patient appears comfortable at rest. HEENT: Conjunctiva and lids normal. Neck: Supple, no elevated JVP or carotid bruits. Lungs: Clear to auscultation, nonlabored breathing at rest. Cardiac: Regular rate and rhythm, 3/6 systolic murmur without gallop. Extremities: Stable appearing lymphedema.  ECG:  An ECG dated 06/08/2024 was personally reviewed today and demonstrated:  Sinus rhythm with right bundle branch block.  Labwork: 12/29/2023: Magnesium  2.0 11/17/2024: ALT 10; AST 22; BUN 20; Creatinine, Ser 0.90; Hemoglobin 12.8; Platelets 192; Potassium 5.0; Sodium 139  December 2025: Cholesterol 123, triglycerides 87, HDL 51, LDL 55  Other Studies Reviewed Today:  No interval cardiac testing for review today.  Assessment and Plan:  1.  History of severe pulmonary hypertension, most likely WHO group 2 and 3 based on workup so far.  Plan is medical  therapy at this point following previous discussions.  Follow-up echocardiogram in October 2024 revealed vigorous LVEF with moderate asymmetric septal hypertrophy and grade 1 diastolic dysfunction.  RV contraction normal with estimated RVSP approximately 47 mmHg (improved).  Currently with NYHA class II dyspnea, plan to continue Jardiance  10 mg daily, Lasix  60 mg daily, and Aldactone  12.5 mg daily.   2.  Ischemic heart disease based on prior noninvasive cardiac imaging.  No angina at current level of activity.  No angina at current level of activity.  Continue aspirin  81 mg daily and Lipitor 40 mg daily.  Recent LDL 55.   3.  Primary hypertension.  No changes to current regimen which also includes Cozaar  100 mg daily, Cardizem  CD 180 mg daily, Coreg  6.25 mg twice daily, and clonidine  0.3 mg twice daily.   4.  Lymphedema.  Stable on current diuretic regimen.   5.  Aortic stenosis, mild by echocardiogram in October 2024, mean AV gradient 12.5 mmHg.SABRA  She has a prominent systolic murmur which is most likely combination of asymmetric septal LV hypertrophy with hyperdynamic LVEF, PCV, and aortic stenosis.  Disposition:  Follow up echocardiogram in October with clinical follow-up thereafter.  Signed, Jayson JUDITHANN Sierras, M.D., F.A.C.C. [facility] "

## 2025-01-01 ENCOUNTER — Ambulatory Visit: Admitting: Internal Medicine

## 2025-01-05 ENCOUNTER — Inpatient Hospital Stay: Attending: Hematology

## 2025-02-17 ENCOUNTER — Inpatient Hospital Stay

## 2025-02-24 ENCOUNTER — Inpatient Hospital Stay: Attending: Hematology | Admitting: Physician Assistant

## 2025-08-12 ENCOUNTER — Other Ambulatory Visit (HOSPITAL_COMMUNITY)
